# Patient Record
Sex: Female | Born: 1935 | Race: White | Hispanic: No | Smoking: Never smoker
Health system: Southern US, Community
[De-identification: ages and names within clinical notes are randomized; demographics above are authoritative.]

## PROBLEM LIST (undated history)

## (undated) DIAGNOSIS — R0600 Dyspnea, unspecified: Secondary | ICD-10-CM

## (undated) DIAGNOSIS — Z9889 Other specified postprocedural states: Secondary | ICD-10-CM

## (undated) DIAGNOSIS — E039 Hypothyroidism, unspecified: Secondary | ICD-10-CM

## (undated) DIAGNOSIS — N280 Ischemia and infarction of kidney: Secondary | ICD-10-CM

## (undated) DIAGNOSIS — H8110 Benign paroxysmal vertigo, unspecified ear: Secondary | ICD-10-CM

## (undated) DIAGNOSIS — I10 Essential (primary) hypertension: Secondary | ICD-10-CM

## (undated) DIAGNOSIS — R112 Nausea with vomiting, unspecified: Secondary | ICD-10-CM

## (undated) DIAGNOSIS — I2510 Atherosclerotic heart disease of native coronary artery without angina pectoris: Secondary | ICD-10-CM

## (undated) DIAGNOSIS — G459 Transient cerebral ischemic attack, unspecified: Secondary | ICD-10-CM

## (undated) DIAGNOSIS — Z973 Presence of spectacles and contact lenses: Secondary | ICD-10-CM

## (undated) DIAGNOSIS — I209 Angina pectoris, unspecified: Secondary | ICD-10-CM

## (undated) DIAGNOSIS — I480 Paroxysmal atrial fibrillation: Secondary | ICD-10-CM

## (undated) DIAGNOSIS — K56609 Unspecified intestinal obstruction, unspecified as to partial versus complete obstruction: Secondary | ICD-10-CM

## (undated) DIAGNOSIS — M1990 Unspecified osteoarthritis, unspecified site: Secondary | ICD-10-CM

## (undated) DIAGNOSIS — T4145XA Adverse effect of unspecified anesthetic, initial encounter: Secondary | ICD-10-CM

## (undated) DIAGNOSIS — T8859XA Other complications of anesthesia, initial encounter: Secondary | ICD-10-CM

## (undated) DIAGNOSIS — I639 Cerebral infarction, unspecified: Secondary | ICD-10-CM

## (undated) HISTORY — DX: Transient cerebral ischemic attack, unspecified: G45.9

## (undated) HISTORY — DX: Paroxysmal atrial fibrillation: I48.0

## (undated) HISTORY — PX: CATARACT EXTRACTION: SUR2

## (undated) HISTORY — PX: TONSILLECTOMY: SUR1361

## (undated) HISTORY — PX: ABDOMINAL HYSTERECTOMY: SHX81

---

## 2013-08-25 DIAGNOSIS — Z1151 Encounter for screening for human papillomavirus (HPV): Secondary | ICD-10-CM | POA: Insufficient documentation

## 2013-08-25 DIAGNOSIS — Z124 Encounter for screening for malignant neoplasm of cervix: Secondary | ICD-10-CM | POA: Insufficient documentation

## 2013-08-28 ENCOUNTER — Other Ambulatory Visit (HOSPITAL_COMMUNITY)
Admission: RE | Admit: 2013-08-28 | Discharge: 2013-08-28 | Disposition: A | Discharge status: Home / Self Care | Payer: Medicare FFS | Source: Ambulatory Visit | Attending: Obstetrics and Gynecology | Admitting: Obstetrics and Gynecology

## 2013-09-01 ENCOUNTER — Other Ambulatory Visit: Payer: Self-pay | Admitting: Gastroenterology

## 2013-11-17 ENCOUNTER — Encounter (HOSPITAL_COMMUNITY): Payer: Self-pay | Admitting: Pharmacy Technician

## 2013-11-18 ENCOUNTER — Encounter (HOSPITAL_COMMUNITY): Payer: Self-pay | Admitting: *Deleted

## 2013-11-19 ENCOUNTER — Ambulatory Visit: Payer: Medicare FFS | Attending: Geriatric Medicine | Admitting: Physical Therapy

## 2013-11-19 DIAGNOSIS — H8110 Benign paroxysmal vertigo, unspecified ear: Secondary | ICD-10-CM | POA: Diagnosis not present

## 2013-11-19 DIAGNOSIS — IMO0001 Reserved for inherently not codable concepts without codable children: Secondary | ICD-10-CM | POA: Diagnosis not present

## 2013-11-25 ENCOUNTER — Other Ambulatory Visit: Payer: Self-pay | Admitting: Gastroenterology

## 2013-11-26 ENCOUNTER — Ambulatory Visit: Payer: Medicare FFS | Admitting: Physical Therapy

## 2013-11-26 DIAGNOSIS — IMO0001 Reserved for inherently not codable concepts without codable children: Secondary | ICD-10-CM | POA: Diagnosis not present

## 2013-12-01 ENCOUNTER — Ambulatory Visit (HOSPITAL_COMMUNITY)
Admission: RE | Admit: 2013-12-01 | Discharge: 2013-12-01 | Disposition: A | Discharge status: Home / Self Care | Payer: Medicare FFS | Source: Ambulatory Visit | Attending: Gastroenterology | Admitting: Gastroenterology

## 2013-12-01 ENCOUNTER — Encounter (HOSPITAL_COMMUNITY): Payer: Medicare FFS | Admitting: Anesthesiology

## 2013-12-01 ENCOUNTER — Encounter (HOSPITAL_COMMUNITY): Payer: Self-pay

## 2013-12-01 ENCOUNTER — Ambulatory Visit (HOSPITAL_COMMUNITY): Payer: Medicare FFS | Admitting: Anesthesiology

## 2013-12-01 ENCOUNTER — Encounter (HOSPITAL_COMMUNITY)
Admission: RE | Disposition: A | Discharge status: Home / Self Care | Payer: Self-pay | Source: Ambulatory Visit | Attending: Gastroenterology

## 2013-12-01 DIAGNOSIS — K4090 Unilateral inguinal hernia, without obstruction or gangrene, not specified as recurrent: Secondary | ICD-10-CM | POA: Diagnosis not present

## 2013-12-01 DIAGNOSIS — M949 Disorder of cartilage, unspecified: Secondary | ICD-10-CM

## 2013-12-01 DIAGNOSIS — E039 Hypothyroidism, unspecified: Secondary | ICD-10-CM | POA: Insufficient documentation

## 2013-12-01 DIAGNOSIS — Z1211 Encounter for screening for malignant neoplasm of colon: Secondary | ICD-10-CM | POA: Insufficient documentation

## 2013-12-01 DIAGNOSIS — M899 Disorder of bone, unspecified: Secondary | ICD-10-CM | POA: Diagnosis not present

## 2013-12-01 DIAGNOSIS — Z888 Allergy status to other drugs, medicaments and biological substances status: Secondary | ICD-10-CM | POA: Insufficient documentation

## 2013-12-01 DIAGNOSIS — E7800 Pure hypercholesterolemia, unspecified: Secondary | ICD-10-CM | POA: Insufficient documentation

## 2013-12-01 HISTORY — DX: Benign paroxysmal vertigo, unspecified ear: H81.10

## 2013-12-01 HISTORY — DX: Hypothyroidism, unspecified: E03.9

## 2013-12-01 HISTORY — DX: Nausea with vomiting, unspecified: R11.2

## 2013-12-01 HISTORY — DX: Other complications of anesthesia, initial encounter: T88.59XA

## 2013-12-01 HISTORY — DX: Nausea with vomiting, unspecified: Z98.890

## 2013-12-01 HISTORY — PX: COLONOSCOPY WITH PROPOFOL: SHX5780

## 2013-12-01 HISTORY — DX: Adverse effect of unspecified anesthetic, initial encounter: T41.45XA

## 2013-12-01 SURGERY — COLONOSCOPY WITH PROPOFOL
Anesthesia: Monitor Anesthesia Care

## 2013-12-01 MED ORDER — LACTATED RINGERS IV SOLN
INTRAVENOUS | Status: DC | PRN
  Administered 2013-12-01: 13:00:00 via INTRAVENOUS

## 2013-12-01 MED ORDER — LACTATED RINGERS IV SOLN
INTRAVENOUS | Status: DC
  Administered 2013-12-01: 1000 mL via INTRAVENOUS

## 2013-12-01 MED ORDER — SODIUM CHLORIDE 0.9 % IV SOLN: INTRAVENOUS | Status: DC

## 2013-12-01 MED ORDER — PROPOFOL 10 MG/ML IV BOLUS
INTRAVENOUS | Status: AC
  Filled 2013-12-01: qty 20

## 2013-12-01 MED ORDER — PROPOFOL 10 MG/ML IV BOLUS
INTRAVENOUS | Status: DC | PRN
  Administered 2013-12-01 (×3): 25 mg via INTRAVENOUS
  Administered 2013-12-01 (×2): 50 mg via INTRAVENOUS
  Administered 2013-12-01: 25 mg via INTRAVENOUS

## 2013-12-01 SURGICAL SUPPLY — 22 items

## 2013-12-01 NOTE — Anesthesia Preprocedure Evaluation (Addendum)
Anesthesia Evaluation  Patient identified by MRN, date of birth, ID band Patient awake    Reviewed: Allergy & Precautions, H&P , NPO status , Patient's Chart, lab work & pertinent test results  History of Anesthesia Complications (+) PONV  Airway Mallampati: II TM Distance: >3 FB Neck ROM: Full    Dental no notable dental hx.    Pulmonary neg pulmonary ROS,  breath sounds clear to auscultation  Pulmonary exam normal       Cardiovascular negative cardio ROS  Rhythm:Regular Rate:Normal     Neuro/Psych negative neurological ROS  negative psych ROS   GI/Hepatic negative GI ROS, Neg liver ROS,   Endo/Other  negative endocrine ROS  Renal/GU negative Renal ROS  negative genitourinary   Musculoskeletal negative musculoskeletal ROS (+)   Abdominal   Peds negative pediatric ROS (+)  Hematology negative hematology ROS (+)   Anesthesia Other Findings   Reproductive/Obstetrics negative OB ROS                          Anesthesia Physical Anesthesia Plan  ASA: II  Anesthesia Plan: MAC   Post-op Pain Management:    Induction:   Airway Management Planned: Simple Face Mask  Additional Equipment:   Intra-op Plan:   Post-operative Plan:   Informed Consent: I have reviewed the patients History and Physical, chart, labs and discussed the procedure including the risks, benefits and alternatives for the proposed anesthesia with the patient or authorized representative who has indicated his/her understanding and acceptance.   Dental advisory given  Plan Discussed with: CRNA  Anesthesia Plan Comments:         Anesthesia Quick Evaluation

## 2013-12-01 NOTE — Op Note (Signed)
Procedure: Screening colonoscopy  Endoscopist: Earle Gell  Premedication: Propofol administered by anesthesia  Procedure: The patient was placed in the left lateral decubitus position. Anal inspection and digital rectal exam were normal. The Pentax pediatric colonoscope was introduced into the rectum and advanced to the cecum. A normal-appearing ileocecal valve and appendiceal orifice were identified. Colonic preparation for the exam today was good. Performance of today's colonoscopy was technically very difficult due to colonic loop formation  Rectum. Normal. Retroflexed view of the distal rectum normal  Sigmoid colon and descending colon. Normal  Splenic flexure. Normal  Transverse colon. Normal  Hepatic flexure. Normal.  Ascending colon. Normal  Cecum and ileocecal valve. Normal  Assessment: Normal screening colonoscopy

## 2013-12-01 NOTE — Transfer of Care (Signed)
Immediate Anesthesia Transfer of Care Note  Patient: Rachel Park  Procedure(s) Performed: Procedure(s): COLONOSCOPY WITH PROPOFOL (N/A)  Patient Location: PACU  Anesthesia Type:MAC  Level of Consciousness: awake, sedated and patient cooperative  Airway & Oxygen Therapy: Patient Spontanous Breathing and Patient connected to face mask oxygen  Post-op Assessment: Report given to PACU RN and Post -op Vital signs reviewed and stable  Post vital signs: Reviewed and stable  Complications: No apparent anesthesia complications

## 2013-12-01 NOTE — Anesthesia Postprocedure Evaluation (Signed)
  Anesthesia Post-op Note  Patient: Rachel Park  Procedure(s) Performed: Procedure(s) (LRB): COLONOSCOPY WITH PROPOFOL (N/A)  Patient Location: PACU  Anesthesia Type: MAC  Level of Consciousness: awake and alert   Airway and Oxygen Therapy: Patient Spontanous Breathing  Post-op Pain: mild  Post-op Assessment: Post-op Vital signs reviewed, Patient's Cardiovascular Status Stable, Respiratory Function Stable, Patent Airway and No signs of Nausea or vomiting  Last Vitals:  Filed Vitals:   12/01/13 1400  BP: 163/69  Pulse: 58  Temp:   Resp: 16    Post-op Vital Signs: stable   Complications: No apparent anesthesia complications

## 2013-12-01 NOTE — Discharge Instructions (Signed)
Colonoscopy, Care After Refer to this sheet in the next few weeks. These instructions provide you with information on caring for yourself after your procedure. Your health care provider may also give you more specific instructions. Your treatment has been planned according to current medical practices, but problems sometimes occur. Call your health care provider if you have any problems or questions after your procedure. WHAT TO EXPECT AFTER THE PROCEDURE  After your procedure, it is typical to have the following:  A small amount of blood in your stool.  Moderate amounts of gas and mild abdominal cramping or bloating. HOME CARE INSTRUCTIONS  Do not drive, operate machinery, or sign important documents for 24 hours.  You may shower and resume your regular physical activities, but move at a slower pace for the first 24 hours.  Take frequent rest periods for the first 24 hours.  Walk around or put a warm pack on your abdomen to help reduce abdominal cramping and bloating.  Drink enough fluids to keep your urine clear or pale yellow.  You may resume your normal diet as instructed by your health care provider. Avoid heavy or fried foods that are hard to digest.  Avoid drinking alcohol for 24 hours or as instructed by your health care provider.  Only take over-the-counter or prescription medicines as directed by your health care provider.  If a tissue sample (biopsy) was taken during your procedure:  Do not take aspirin or blood thinners for 7 days, or as instructed by your health care provider.  Do not drink alcohol for 7 days, or as instructed by your health care provider.  Eat soft foods for the first 24 hours. SEEK MEDICAL CARE IF: You have persistent spotting of blood in your stool 2-3 days after the procedure. SEEK IMMEDIATE MEDICAL CARE IF:  You have more than a small spotting of blood in your stool.  You pass large blood clots in your stool.  Your abdomen is swollen  (distended).  You have nausea or vomiting.  You have a fever.  You have increasing abdominal pain that is not relieved with medicine. Document Released: 10/05/2003 Document Revised: 12/11/2012 Document Reviewed: 10/28/2012 [facility] Patient Information 2015 [place], [place]. This information is not intended to replace advice given to you by your health care provider. Make sure you discuss any questions you have with your health care provider.  Jenavie Stanczak  12/01/2013    Brief History and Findings:{NA AND GGEZMOQH:47654}.   Local Anesthetic:{NA AND YTKPTWSF:68127}.   Procedure: COLONOSCOPY WITH PROPOFOL.   Specimen:{Specimen:14613}.   Condition of Patient Post Procedure:{Post Procedure Pat NTZG:01749::"SWHQPRFFM procedure well"}.   Instructions    Activity:{plan; gen activity restrictions:13447}   Diet:{diet:18644}   Wound Care:{wound care:18263}   Follow-up:{follow up:32580} with Garlan Fair, MD.   Other{NA AND BWGYKZLD:35701}.

## 2013-12-01 NOTE — H&P (Signed)
  Procedure: Screening colonoscopy. Screening colonoscopy performed in [place] in January 2005.  History: The patient is a 80 year old female scheduled to undergo a screening colonoscopy with polypectomy to prevent colon cancer.  Medication allergies: Zocor causes muscle pain  Past medical history: Osteopenia. Hypothyroidism. Hypercholesterolemia. Left inguinal hernia. Bilateral cataract surgery. Vaginal hysterectomy. Tonsillectomy.  Exam: The patient is alert and lying comfortably on the endoscopy stretcher. Cardiac exam reveals a regular rhythm. Lungs are clear to auscultation. Abdomen is soft and nontender to palpation  Plan: Proceed with screening colonoscopy

## 2013-12-02 ENCOUNTER — Encounter (HOSPITAL_COMMUNITY): Payer: Self-pay | Admitting: Gastroenterology

## 2013-12-03 ENCOUNTER — Ambulatory Visit: Payer: Medicare FFS | Admitting: Physical Therapy

## 2013-12-05 ENCOUNTER — Ambulatory Visit: Payer: Medicare FFS | Attending: Geriatric Medicine | Admitting: Physical Therapy

## 2013-12-05 DIAGNOSIS — H8110 Benign paroxysmal vertigo, unspecified ear: Secondary | ICD-10-CM | POA: Insufficient documentation

## 2013-12-10 ENCOUNTER — Ambulatory Visit: Payer: Medicare FFS | Admitting: Physical Therapy

## 2013-12-17 ENCOUNTER — Encounter: Payer: Medicare FFS | Admitting: Rehabilitative and Restorative Service Providers"

## 2014-12-24 ENCOUNTER — Emergency Department (HOSPITAL_COMMUNITY): Payer: Medicare Other

## 2014-12-24 ENCOUNTER — Inpatient Hospital Stay (HOSPITAL_COMMUNITY): Payer: Medicare Other

## 2014-12-24 ENCOUNTER — Inpatient Hospital Stay (HOSPITAL_COMMUNITY)
Admission: EM | Admit: 2014-12-24 | Discharge: 2014-12-26 | DRG: 246 | Disposition: A | Discharge status: Home / Self Care | Payer: Medicare Other | Attending: Cardiovascular Disease | Admitting: Cardiovascular Disease

## 2014-12-24 ENCOUNTER — Encounter (HOSPITAL_COMMUNITY)
Admission: EM | Disposition: A | Discharge status: Home / Self Care | Payer: Medicare FFS | Source: Home / Self Care | Attending: Cardiovascular Disease

## 2014-12-24 ENCOUNTER — Encounter (HOSPITAL_COMMUNITY): Payer: Self-pay

## 2014-12-24 DIAGNOSIS — R1110 Vomiting, unspecified: Secondary | ICD-10-CM | POA: Diagnosis present

## 2014-12-24 DIAGNOSIS — I97820 Postprocedural cerebrovascular infarction during cardiac surgery: Secondary | ICD-10-CM | POA: Diagnosis not present

## 2014-12-24 DIAGNOSIS — R4701 Aphasia: Secondary | ICD-10-CM | POA: Diagnosis not present

## 2014-12-24 DIAGNOSIS — T502X5A Adverse effect of carbonic-anhydrase inhibitors, benzothiadiazides and other diuretics, initial encounter: Secondary | ICD-10-CM | POA: Diagnosis not present

## 2014-12-24 DIAGNOSIS — J449 Chronic obstructive pulmonary disease, unspecified: Secondary | ICD-10-CM | POA: Diagnosis present

## 2014-12-24 DIAGNOSIS — I25110 Atherosclerotic heart disease of native coronary artery with unstable angina pectoris: Principal | ICD-10-CM | POA: Diagnosis present

## 2014-12-24 DIAGNOSIS — Z79899 Other long term (current) drug therapy: Secondary | ICD-10-CM

## 2014-12-24 DIAGNOSIS — E039 Hypothyroidism, unspecified: Secondary | ICD-10-CM | POA: Diagnosis not present

## 2014-12-24 DIAGNOSIS — Z90710 Acquired absence of both cervix and uterus: Secondary | ICD-10-CM

## 2014-12-24 DIAGNOSIS — I639 Cerebral infarction, unspecified: Secondary | ICD-10-CM

## 2014-12-24 DIAGNOSIS — I63139 Cerebral infarction due to embolism of unspecified carotid artery: Secondary | ICD-10-CM | POA: Diagnosis not present

## 2014-12-24 DIAGNOSIS — Z9842 Cataract extraction status, left eye: Secondary | ICD-10-CM | POA: Diagnosis not present

## 2014-12-24 DIAGNOSIS — I200 Unstable angina: Secondary | ICD-10-CM | POA: Diagnosis not present

## 2014-12-24 DIAGNOSIS — Z9841 Cataract extraction status, right eye: Secondary | ICD-10-CM | POA: Diagnosis not present

## 2014-12-24 DIAGNOSIS — Z955 Presence of coronary angioplasty implant and graft: Secondary | ICD-10-CM

## 2014-12-24 DIAGNOSIS — E785 Hyperlipidemia, unspecified: Secondary | ICD-10-CM | POA: Diagnosis present

## 2014-12-24 DIAGNOSIS — I209 Angina pectoris, unspecified: Secondary | ICD-10-CM

## 2014-12-24 DIAGNOSIS — I10 Essential (primary) hypertension: Secondary | ICD-10-CM | POA: Diagnosis present

## 2014-12-24 DIAGNOSIS — I6789 Other cerebrovascular disease: Secondary | ICD-10-CM | POA: Diagnosis not present

## 2014-12-24 DIAGNOSIS — I2510 Atherosclerotic heart disease of native coronary artery without angina pectoris: Secondary | ICD-10-CM

## 2014-12-24 DIAGNOSIS — I63132 Cerebral infarction due to embolism of left carotid artery: Secondary | ICD-10-CM | POA: Diagnosis not present

## 2014-12-24 DIAGNOSIS — I6340 Cerebral infarction due to embolism of unspecified cerebral artery: Secondary | ICD-10-CM | POA: Diagnosis not present

## 2014-12-24 HISTORY — PX: CARDIAC CATHETERIZATION: SHX172

## 2014-12-24 LAB — COMPREHENSIVE METABOLIC PANEL
ALBUMIN: 3.7 g/dL (ref 3.5–5.0)
ALT: 24 U/L (ref 14–54)
ANION GAP: 7 (ref 5–15)
AST: 39 U/L (ref 15–41)
Alkaline Phosphatase: 57 U/L (ref 38–126)
BILIRUBIN TOTAL: 0.5 mg/dL (ref 0.3–1.2)
BUN: 20 mg/dL (ref 6–20)
CALCIUM: 9.2 mg/dL (ref 8.9–10.3)
CHLORIDE: 106 mmol/L (ref 101–111)
CO2: 25 mmol/L (ref 22–32)
Creatinine, Ser: 0.56 mg/dL (ref 0.44–1.00)
GFR calc Af Amer: >60 mL/min (ref >60)
GFR calc non Af Amer: >60 mL/min (ref >60)
Glucose, Bld: 103 mg/dL — ABNORMAL HIGH (ref 65–99)
POTASSIUM: 4.1 mmol/L (ref 3.5–5.1)
SODIUM: 138 mmol/L (ref 135–145)
Total Protein: 6.1 g/dL — ABNORMAL LOW (ref 6.5–8.1)

## 2014-12-24 LAB — CBC WITH DIFFERENTIAL/PLATELET
BASOS PCT: 1 %
Basophils Absolute: 0 10*3/uL (ref 0.0–0.1)
EOS PCT: 1 %
Eosinophils Absolute: 0.1 10*3/uL (ref 0.0–0.7)
HCT: 37.8 % (ref 36.0–46.0)
Hemoglobin: 12.6 g/dL (ref 12.0–15.0)
LYMPHS ABS: 1.7 10*3/uL (ref 0.7–4.0)
Lymphocytes Relative: 28 %
MCH: 31.7 pg (ref 26.0–34.0)
MCHC: 33.3 g/dL (ref 30.0–36.0)
MCV: 95 fL (ref 78.0–100.0)
Monocytes Absolute: 0.4 10*3/uL (ref 0.1–1.0)
Monocytes Relative: 7 %
Neutro Abs: 3.8 10*3/uL (ref 1.7–7.7)
Neutrophils Relative %: 63 %
PLATELETS: 234 10*3/uL (ref 150–400)
RBC: 3.98 MIL/uL (ref 3.87–5.11)
RDW: 13.2 % (ref 11.5–15.5)
WBC: 5.9 10*3/uL (ref 4.0–10.5)

## 2014-12-24 LAB — URINALYSIS, ROUTINE W REFLEX MICROSCOPIC
BILIRUBIN URINE: NEGATIVE
Glucose, UA: NEGATIVE mg/dL
HGB URINE DIPSTICK: NEGATIVE
KETONES UR: NEGATIVE mg/dL
Nitrite: NEGATIVE
PH: 7 (ref 5.0–8.0)
Protein, ur: NEGATIVE mg/dL
Specific Gravity, Urine: 1.006 (ref 1.005–1.030)
Urobilinogen, UA: 0.2 mg/dL (ref 0.0–1.0)

## 2014-12-24 LAB — TSH: TSH: 2.481 u[IU]/mL (ref 0.350–4.500)

## 2014-12-24 LAB — PROTIME-INR
INR: 0.97 (ref 0.00–1.49)
PROTHROMBIN TIME: 13.1 s (ref 11.6–15.2)

## 2014-12-24 LAB — LIPASE, BLOOD: LIPASE: 26 U/L (ref 11–51)

## 2014-12-24 LAB — URINE MICROSCOPIC-ADD ON

## 2014-12-24 LAB — TROPONIN I

## 2014-12-24 LAB — BRAIN NATRIURETIC PEPTIDE: B Natriuretic Peptide: 100.3 pg/mL — ABNORMAL HIGH (ref 0.0–100.0)

## 2014-12-24 LAB — MRSA PCR SCREENING: MRSA by PCR: NEGATIVE

## 2014-12-24 SURGERY — LEFT HEART CATH AND CORONARY ANGIOGRAPHY
Anesthesia: LOCAL

## 2014-12-24 MED ORDER — MIDAZOLAM HCL 2 MG/2ML IJ SOLN
INTRAMUSCULAR | Status: DC | PRN
  Administered 2014-12-24: 1 mg via INTRAVENOUS
  Administered 2014-12-24: 2 mg via INTRAVENOUS

## 2014-12-24 MED ORDER — BIOTIN 5 MG PO CAPS: 5.0000 mg | ORAL_CAPSULE | Freq: Every day | ORAL | Status: DC

## 2014-12-24 MED ORDER — HEPARIN (PORCINE) IN NACL 2-0.9 UNIT/ML-% IJ SOLN
INTRAMUSCULAR | Status: AC
  Filled 2014-12-24: qty 1000

## 2014-12-24 MED ORDER — VERAPAMIL HCL 2.5 MG/ML IV SOLN
INTRAVENOUS | Status: AC
  Filled 2014-12-24: qty 2

## 2014-12-24 MED ORDER — SODIUM CHLORIDE 0.9 % IJ SOLN: 3.0000 mL | INTRAMUSCULAR | Status: DC | PRN

## 2014-12-24 MED ORDER — LEVOTHYROXINE SODIUM 75 MCG PO TABS
75.0000 ug | ORAL_TABLET | Freq: Every day | ORAL | Status: DC
  Administered 2014-12-25 – 2014-12-26 (×2): 75 ug via ORAL
  Filled 2014-12-24 (×2): qty 1

## 2014-12-24 MED ORDER — ASPIRIN 81 MG PO CHEW: 81.0000 mg | CHEWABLE_TABLET | ORAL | Status: DC

## 2014-12-24 MED ORDER — CALCIUM CARB-CHOLECALCIFEROL 600-200 MG-UNIT PO TABS: ORAL_TABLET | Freq: Every day | ORAL | Status: DC

## 2014-12-24 MED ORDER — ASPIRIN EC 81 MG PO TBEC
81.0000 mg | DELAYED_RELEASE_TABLET | Freq: Every day | ORAL | Status: DC
  Administered 2014-12-25 – 2014-12-26 (×2): 81 mg via ORAL
  Filled 2014-12-24 (×2): qty 1

## 2014-12-24 MED ORDER — CLOPIDOGREL BISULFATE 300 MG PO TABS
ORAL_TABLET | ORAL | Status: DC | PRN
  Administered 2014-12-24: 600 mg via ORAL

## 2014-12-24 MED ORDER — LIDOCAINE HCL (PF) 1 % IJ SOLN
INTRAMUSCULAR | Status: AC
  Filled 2014-12-24: qty 30

## 2014-12-24 MED ORDER — OXYCODONE-ACETAMINOPHEN 5-325 MG PO TABS
1.0000 | ORAL_TABLET | ORAL | Status: DC | PRN
  Administered 2014-12-24: 1 via ORAL
  Filled 2014-12-24: qty 1

## 2014-12-24 MED ORDER — ONDANSETRON HCL 4 MG/2ML IJ SOLN
4.0000 mg | Freq: Once | INTRAMUSCULAR | Status: AC
  Administered 2014-12-24: 4 mg via INTRAVENOUS

## 2014-12-24 MED ORDER — NITROGLYCERIN 1 MG/10 ML FOR IR/CATH LAB
INTRA_ARTERIAL | Status: AC
Start: 2014-12-24 — End: 2014-12-24
  Filled 2014-12-24: qty 10

## 2014-12-24 MED ORDER — MIDAZOLAM HCL 2 MG/2ML IJ SOLN
INTRAMUSCULAR | Status: AC
  Filled 2014-12-24: qty 4

## 2014-12-24 MED ORDER — HEPARIN SODIUM (PORCINE) 1000 UNIT/ML IJ SOLN
INTRAMUSCULAR | Status: AC
  Filled 2014-12-24: qty 1

## 2014-12-24 MED ORDER — BIVALIRUDIN BOLUS VIA INFUSION - CUPID
INTRAVENOUS | Status: DC | PRN
  Administered 2014-12-24: 42.45 mg via INTRAVENOUS

## 2014-12-24 MED ORDER — ACETAMINOPHEN 325 MG PO TABS
650.0000 mg | ORAL_TABLET | Freq: Four times a day (QID) | ORAL | Status: DC | PRN
  Administered 2014-12-24 – 2014-12-25 (×3): 650 mg via ORAL
  Filled 2014-12-24 (×3): qty 2

## 2014-12-24 MED ORDER — ONDANSETRON HCL 4 MG/2ML IJ SOLN
4.0000 mg | Freq: Four times a day (QID) | INTRAMUSCULAR | Status: DC | PRN
  Administered 2014-12-24: 4 mg via INTRAVENOUS
  Filled 2014-12-24: qty 2

## 2014-12-24 MED ORDER — CLOPIDOGREL BISULFATE 75 MG PO TABS
75.0000 mg | ORAL_TABLET | Freq: Every day | ORAL | Status: DC
Start: 2014-12-25 — End: 2014-12-26
  Administered 2014-12-25 – 2014-12-26 (×2): 75 mg via ORAL
  Filled 2014-12-24 (×2): qty 1

## 2014-12-24 MED ORDER — SODIUM CHLORIDE 0.9 % IJ SOLN
3.0000 mL | Freq: Two times a day (BID) | INTRAMUSCULAR | Status: DC
  Administered 2014-12-24 – 2014-12-25 (×3): 3 mL via INTRAVENOUS

## 2014-12-24 MED ORDER — ATORVASTATIN CALCIUM 40 MG PO TABS
40.0000 mg | ORAL_TABLET | Freq: Every day | ORAL | Status: DC
  Administered 2014-12-25: 40 mg via ORAL
  Filled 2014-12-24: qty 1

## 2014-12-24 MED ORDER — SODIUM CHLORIDE 0.9 % WEIGHT BASED INFUSION: 1.0000 mL/kg/h | INTRAVENOUS | Status: DC

## 2014-12-24 MED ORDER — SODIUM CHLORIDE 0.9 % WEIGHT BASED INFUSION: 3.0000 mL/kg/h | INTRAVENOUS | Status: DC

## 2014-12-24 MED ORDER — SODIUM CHLORIDE 0.9 % IV SOLN
250.0000 mg | INTRAVENOUS | Status: DC | PRN
  Administered 2014-12-24: 1.75 mg/kg/h via INTRAVENOUS

## 2014-12-24 MED ORDER — IOHEXOL 350 MG/ML SOLN
INTRAVENOUS | Status: DC | PRN
  Administered 2014-12-24: 130 mL via INTRACARDIAC

## 2014-12-24 MED ORDER — ADULT MULTIVITAMIN W/MINERALS CH: 1.0000 | ORAL_TABLET | Freq: Every day | ORAL | Status: DC

## 2014-12-24 MED ORDER — HYDRALAZINE HCL 20 MG/ML IJ SOLN
10.0000 mg | Freq: Four times a day (QID) | INTRAMUSCULAR | Status: DC | PRN
  Administered 2014-12-24 (×2): 10 mg via INTRAVENOUS
  Filled 2014-12-24 (×2): qty 1

## 2014-12-24 MED ORDER — HEPARIN SODIUM (PORCINE) 1000 UNIT/ML IJ SOLN
INTRAMUSCULAR | Status: DC | PRN
Start: 2014-12-24 — End: 2014-12-24
  Administered 2014-12-24: 3000 [IU] via INTRAVENOUS

## 2014-12-24 MED ORDER — CARVEDILOL 3.125 MG PO TABS
3.1250 mg | ORAL_TABLET | Freq: Two times a day (BID) | ORAL | Status: DC
  Administered 2014-12-25 (×2): 3.125 mg via ORAL
  Filled 2014-12-24 (×2): qty 1

## 2014-12-24 MED ORDER — HEPARIN (PORCINE) IN NACL 2-0.9 UNIT/ML-% IJ SOLN
INTRAMUSCULAR | Status: DC | PRN
  Administered 2014-12-24: 18:00:00 via INTRA_ARTERIAL

## 2014-12-24 MED ORDER — METOPROLOL TARTRATE 1 MG/ML IV SOLN
2.5000 mg | Freq: Once | INTRAVENOUS | Status: DC
  Filled 2014-12-24: qty 5

## 2014-12-24 MED ORDER — NITROGLYCERIN 0.4 MG SL SUBL: 0.4000 mg | SUBLINGUAL_TABLET | SUBLINGUAL | Status: DC | PRN

## 2014-12-24 MED ORDER — METOCLOPRAMIDE HCL 5 MG/ML IJ SOLN
10.0000 mg | Freq: Four times a day (QID) | INTRAMUSCULAR | Status: DC | PRN
  Administered 2014-12-25: 10 mg via INTRAVENOUS
  Filled 2014-12-24: qty 2

## 2014-12-24 MED ORDER — SODIUM CHLORIDE 0.9 % IV SOLN
250.0000 mL | INTRAVENOUS | Status: DC | PRN
  Administered 2014-12-24: 250 mL via INTRAVENOUS

## 2014-12-24 MED ORDER — BIVALIRUDIN 250 MG IV SOLR
INTRAVENOUS | Status: AC
  Filled 2014-12-24: qty 250

## 2014-12-24 MED ORDER — NITROGLYCERIN 1 MG/10 ML FOR IR/CATH LAB
INTRA_ARTERIAL | Status: DC | PRN
  Administered 2014-12-24: 150 ug via INTRACORONARY
  Administered 2014-12-24: 100 ug via INTRACORONARY

## 2014-12-24 MED ORDER — ONDANSETRON HCL 4 MG/2ML IJ SOLN: 4.0000 mg | Freq: Four times a day (QID) | INTRAMUSCULAR | Status: DC | PRN

## 2014-12-24 MED ORDER — ACETAMINOPHEN 325 MG PO TABS: 650.0000 mg | ORAL_TABLET | ORAL | Status: DC | PRN

## 2014-12-24 MED ORDER — SODIUM CHLORIDE 0.9 % IJ SOLN: 3.0000 mL | Freq: Two times a day (BID) | INTRAMUSCULAR | Status: DC

## 2014-12-24 MED ORDER — FENTANYL CITRATE (PF) 100 MCG/2ML IJ SOLN
INTRAMUSCULAR | Status: DC | PRN
  Administered 2014-12-24: 25 ug via INTRAVENOUS
  Administered 2014-12-24: 50 ug via INTRAVENOUS

## 2014-12-24 MED ORDER — FENTANYL CITRATE (PF) 100 MCG/2ML IJ SOLN
INTRAMUSCULAR | Status: AC
Start: 2014-12-24 — End: 2014-12-24
  Filled 2014-12-24: qty 4

## 2014-12-24 MED ORDER — CLOPIDOGREL BISULFATE 300 MG PO TABS
ORAL_TABLET | ORAL | Status: AC
  Filled 2014-12-24: qty 2

## 2014-12-24 MED ORDER — VITAMIN D 1000 UNITS PO TABS: 1000.0000 [IU] | ORAL_TABLET | Freq: Every day | ORAL | Status: DC

## 2014-12-24 MED ORDER — BLISTEX MEDICATED EX OINT
TOPICAL_OINTMENT | CUTANEOUS | Status: DC | PRN
  Administered 2014-12-25: 01:00:00 via TOPICAL
  Filled 2014-12-24: qty 10

## 2014-12-24 MED ORDER — ASPIRIN 81 MG PO CHEW
324.0000 mg | CHEWABLE_TABLET | ORAL | Status: AC
  Administered 2014-12-24: 324 mg via ORAL
  Filled 2014-12-24: qty 4

## 2014-12-24 MED ORDER — ONDANSETRON HCL 4 MG/2ML IJ SOLN
4.0000 mg | Freq: Four times a day (QID) | INTRAMUSCULAR | Status: DC | PRN
Start: 2014-12-24 — End: 2014-12-24

## 2014-12-24 MED ORDER — SODIUM CHLORIDE 0.9 % IV SOLN: 250.0000 mL | INTRAVENOUS | Status: DC | PRN

## 2014-12-24 MED ORDER — NITROGLYCERIN 2 % TD OINT
1.0000 [in_us] | TOPICAL_OINTMENT | Freq: Once | TRANSDERMAL | Status: AC
  Administered 2014-12-24: 1 [in_us] via TOPICAL
  Filled 2014-12-24: qty 1

## 2014-12-24 MED ORDER — SODIUM CHLORIDE 0.9 % IV SOLN: INTRAVENOUS | Status: AC

## 2014-12-24 MED ORDER — CALCIUM CARBONATE-VITAMIN D 500-200 MG-UNIT PO TABS
1.0000 | ORAL_TABLET | Freq: Every day | ORAL | Status: DC
  Administered 2014-12-25 – 2014-12-26 (×2): 1 via ORAL
  Filled 2014-12-24 (×2): qty 1

## 2014-12-24 MED ORDER — ASPIRIN 300 MG RE SUPP: 300.0000 mg | RECTAL | Status: AC

## 2014-12-24 MED ORDER — ONDANSETRON HCL 4 MG/2ML IJ SOLN
INTRAMUSCULAR | Status: AC
  Administered 2014-12-24: 4 mg via INTRAVENOUS
  Filled 2014-12-24: qty 2

## 2014-12-24 SURGICAL SUPPLY — 22 items
BALLN EMERGE MR 2.5X15 (BALLOONS) ×2
BALLN ~~LOC~~ EMERGE MR 3.25X15 (BALLOONS) ×2
BALLOON EMERGE MR 2.5X15 (BALLOONS) IMPLANT
BALLOON ~~LOC~~ EMERGE MR 3.25X15 (BALLOONS) IMPLANT
CATH HEARTRAIL 6F IL4.0 (CATHETERS) ×1 IMPLANT
CATH INFINITI 5 FR JL3.5 (CATHETERS) ×2 IMPLANT
CATH INFINITI 5FR ANG PIGTAIL (CATHETERS) ×2 IMPLANT
CATH INFINITI JR4 5F (CATHETERS) ×2 IMPLANT
CATH VISTA GUIDE 6FR XBLAD3.5 (CATHETERS) ×1 IMPLANT
DEVICE RAD COMP TR BAND LRG (VASCULAR PRODUCTS) ×2 IMPLANT
GLIDESHEATH SLEND SS 6F .021 (SHEATH) ×2 IMPLANT
KIT ENCORE 26 ADVANTAGE (KITS) ×1 IMPLANT
KIT HEART LEFT (KITS) ×2 IMPLANT
PACK CARDIAC CATHETERIZATION (CUSTOM PROCEDURE TRAY) ×2 IMPLANT
STENT PROMUS PREM MR 3.0X20 (Permanent Stent) ×1 IMPLANT
SYR MEDRAD MARK V 150ML (SYRINGE) ×2 IMPLANT
TRANSDUCER W/STOPCOCK (MISCELLANEOUS) ×2 IMPLANT
TUBING CIL FLEX 10 FLL-RA (TUBING) ×2 IMPLANT
TUBING CONTRAST HIGH PRESS 48 (TUBING) ×1 IMPLANT
WIRE COUGAR XT STRL 190CM (WIRE) ×1 IMPLANT
WIRE HI TORQ VERSACORE-J 145CM (WIRE) ×1 IMPLANT
WIRE SAFE-T 1.5MM-J .035X260CM (WIRE) ×2 IMPLANT

## 2014-12-24 NOTE — Progress Notes (Signed)
[place] Progress Note Patient Name: Angelle Isais DOB: [birth year] MRN: [id number]   Date of Service  12/24/2014  HPI/Events of Note  Vomiting. Antiemetic order requested. QTc interval = 440 milliseconds.  eICU Interventions  Will order: 1. Zofran 4 mg IV Q 6 hours PRN N/V. 2. Monitor QTc interval Q 6 hours. Notify MD if QTc interval > 500 milliseconds.     Intervention Category Intermediate Interventions: Other:  Antone Summons Cornelia Copa 12/24/2014, 10:45 PM

## 2014-12-24 NOTE — H&P (Signed)
ADMISSION HISTORY AND PHYSICAL   Date: 12/24/2014               Patient Name:  Rachel Park MRN: [id number]  DOB: [birth year] Age / Sex: 81 y.o., female        PCP: No primary care provider on file. Primary Cardiologist: Nahser         History of Present Illness: Patient is a 81 y.o. female with hx of hypothyroidism, who was admitted to [facility] on 12/24/2014 for evaluation of  Exertional dyspnea.  For the past 6 weeks, she has been having exertional chest tightness .  Walks regularly. Has waked for 11 years.   Walks 6 times a week, several miles a day.  Has exertional chest pressure, Weakness in left arm. Normally walks 35 - 40 minutes.  Has these episodes of DOE and left arm pain within 5 minutes.  Some radiation up to her neck .  Pain is a pressure / tightness.  Feels like a bubble in her chest   Father has hx of CAD - early 40s Lipids are a little high,  HDL is very high. Eats a good diet Never smoked.    Medications: Outpatient medications:  (Not in a hospital admission)  No Known Allergies   Past Medical History  Diagnosis Date  . Complication of anesthesia   . PONV (postoperative nausea and vomiting)   . Hypothyroidism   . Vertigo, benign positional     to be evaluated, intermittent    Past Surgical History  Procedure Laterality Date  . Abdominal hysterectomy    . Tonsillectomy    . Cataract extraction Bilateral   . Colonoscopy with propofol N/A 12/01/2013    Procedure: COLONOSCOPY WITH PROPOFOL;  Surgeon: Garlan Fair, MD;  Location: [facility] ENDOSCOPY;  Service: Endoscopy;  Laterality: N/A;    History reviewed. No pertinent family history.  Social History:  reports that she has never smoked. She does not have any smokeless tobacco history on file. She reports that she drinks alcohol. She reports that she does not use illicit drugs.   Review of Systems: Constitutional:  denies fever, chills, diaphoresis, appetite change and fatigue.  HEENT:  denies photophobia, eye pain, redness, hearing loss, ear pain, congestion, sore throat, rhinorrhea, sneezing, neck pain, neck stiffness and tinnitus.  Respiratory: denies SOB, DOE, cough, chest tightness, and wheezing.  Cardiovascular: admits to chest pain, chest pressure / tightness   Gastrointestinal: denies nausea, vomiting, abdominal pain, diarrhea, constipation, blood in stool.  Genitourinary: denies dysuria, urgency, frequency, hematuria, flank pain and difficulty urinating.  Musculoskeletal: denies  myalgias, back pain, joint swelling, arthralgias and gait problem.   Skin: denies pallor, rash and wound.  Neurological: denies dizziness, seizures, syncope, weakness, light-headedness, numbness and headaches.   Hematological: denies adenopathy, easy bruising, personal or family bleeding history.  Psychiatric/ Behavioral: denies suicidal ideation, mood changes, confusion, nervousness, sleep disturbance and agitation.    Physical Exam: BP 192/89 mmHg  Pulse 74  Temp(Src) 98.3 F (36.8 C) (Oral)  Resp 23  SpO2 98%  Wt Readings from Last 3 Encounters:  12/01/13 58.968 kg (130 lb)    General: Vital signs reviewed and noted. Well-developed, well-nourished, in no acute distress; alert,   Head: Normocephalic, atraumatic, sclera anicteric, mucus membranes are moist   Neck: Supple. Negative for carotid bruits. JVD not elevated.   Lungs:  Clear bilaterally to auscultation without wheezes, rales, or rhonchi. Breathing is normal   Heart: RRR with S1 S2. Soft  systolic murmur, no  rubs, or gallops.   Abdomen:  Soft, non-tender, non-distended with normoactive bowel sounds. No hepatomegaly. No rebound/guarding. No obvious abdominal masses   MSK: Strength and the appear normal for age.   Extremities: No clubbing or cyanosis. No edema.  Distal pedal pulses are 2+ and equal bilaterally .  Neurologic: Alert and oriented X 3. Moves all extremities spontaneously   Psych:  normal     Lab  results: Basic Metabolic Panel:  Recent Labs Lab 12/24/14 1041  NA 138  K 4.1  CL 106  CO2 25  GLUCOSE 103*  BUN 20  CREATININE 0.56  CALCIUM 9.2    Liver Function Tests:  Recent Labs Lab 12/24/14 1041  AST 39  ALT 24  ALKPHOS 57  BILITOT 0.5  PROT 6.1*  ALBUMIN 3.7    Recent Labs Lab 12/24/14 1041  LIPASE 26    CBC:  Recent Labs Lab 12/24/14 1041  WBC 5.9  NEUTROABS 3.8  HGB 12.6  HCT 37.8  MCV 95.0  PLT 234    Cardiac Enzymes:  Recent Labs Lab 12/24/14 1041  TROPONINI <0.03    BNP: Invalid input(s): POCBNP  CBG: No results for input(s): GLUCAP in the last 168 hours.  Coagulation Studies:  Recent Labs  12/24/14 1041  LABPROT 13.1  INR 0.97     Other results:  EKG :  NSR , no ST or T wave changes   Imaging: Dg Chest 2 View  12/24/2014  CLINICAL DATA:  Chest pain for 6 weeks. EXAM: CHEST  2 VIEW COMPARISON:  None. FINDINGS: The heart size and mediastinal contours are within normal limits. Both lungs are clear. Pulmonary hyperinflation, consistent with COPD. No evidence of pneumothorax or pleural effusion. IMPRESSION: COPD.  No active disease. Electronically Signed   By: Earle Gell M.D.   On: 12/24/2014 11:42      Assessment & Plan:  1. Unstable angina: [name] presents with symptoms consistent with angina. She's been relatively healthy all of her life. She has walked 2-3 miles every day for the past 11 years. 6 weeks ago she started having exertional chest discomfort.  This discomfort is described as a chest pressure. It radiates out to her left arm and perhaps up into her neck. There is slight shortness of breath.  On occasion she is able to walk through this pain but mostly, causes her to stop walking and returned home.  She was in her primary medical doctor's office today and had an episode of chest pain at rest.  It's possible that some of her symptoms may be due to high blood pressure. Her blood pressure is typically  in the normal range although it is noted to be elevated today.  At this point I think that our best option is to proceed with cardiac catheterization.  We discussed the risks, benefits, and options concerning cardiac catheterization. She understands and agrees to proceed.  2. Hypothyroidism: We'll check a TSH today. Continue current dose of Synthroid.  3. Essential hypertension: Her blood pressure is typically normal. It is elevated today but this might be due to the anxiety of being referred to the emergency room.  4. Hyperlipidemia:  Lipids are a little elevated.  She says her HDL is very high and the LDL is slightly elevated  Will check lipid profile tomorrow  DVT PPX -    Thayer Headings, Brooke Bonito., MD, [facility] 12/24/2014, 12:29 PM

## 2014-12-24 NOTE — Interval H&P Note (Signed)
History and Physical Interval Note:  12/24/2014 5:38 PM  Rachel Park  has presented today for surgery, with the diagnosis of cp  The various methods of treatment have been discussed with the patient and family. After consideration of risks, benefits and other options for treatment, the patient has consented to  Procedure(s): Left Heart Cath and Coronary Angiography (N/A) as a surgical intervention .  The patient's history has been reviewed, patient examined, no change in status, stable for surgery.  I have reviewed the patient's chart and labs.  Questions were answered to the patient's satisfaction.    Cath Lab Visit (complete for each Cath Lab visit)  Clinical Evaluation Leading to the Procedure:   ACS: Yes.    Non-ACS:    Anginal Classification: CCS IV  Anti-ischemic medical therapy: Minimal Therapy (1 class of medications)  Non-Invasive Test Results: No non-invasive testing performed  Prior CABG: No previous CABG       Sherren Mocha

## 2014-12-24 NOTE — Progress Notes (Signed)
Pt with acute onset of neurological changes. Pt, who was completely A&O at shift change, began complaining of headache at 2020. Pt initially refused medication and simply requested repositioning. Later pt did request medication as pain progressed in severity. Charge RN called this nurse to the room at 2215 stating that pt was having trouble remembering her name, the location, and the name of her son and daughter in law. Full neurological assessment performed at this time. See neurology assessment under flowsheets. MD on call notified at this time

## 2014-12-24 NOTE — Progress Notes (Signed)
Patient developed some expressive aphasia around 10:00 tonight and code stroke was called.  CT scan evidently was unremarkable for an acute bleed or acute event although a lacunar stroke was noted in the left basal ganglion.  Dr. Katherine Roan has seen the patient for neurology.  Symptoms have been somewhat waxing and waning of her somewhat better now.  Also complained of headache and had recurrent vomiting around 11:15.  No ischemic chest pain.  Complained of bubble type feeling in chest earlier after vomiting.  BP currently 179/81.  In sinus with PACs on monitor  Impression:  CVA occurring post stroke possibly embolic versus TIA  Recommendations:  Discussed with Dr. Katherine Roan.  Monitor at the present time.  Zofran for nausea.  Blood pressure to run higher because of recent stroke.  Continue antiplatelets.   Kerry Hough MD [facility]   .

## 2014-12-24 NOTE — ED Provider Notes (Signed)
CSN: [id number]     Arrival date & time 12/24/14  1021 History   First MD Initiated Contact with Patient 12/24/14 1029     Chief Complaint  Patient presents with  . Chest Pain     (Consider location/radiation/quality/duration/timing/severity/associated sxs/prior Treatment) HPI Patient states for several weeks she has been noting chest tightness with exertion. She normally walks each morning 45 minutes to an hour. Typically that gives her no difficulty. Over about the past 2 weeks she started to notice tightness in her chest after walking for a period of time. Resolves with rest. She is also starting to notice when she goes to the gym that she develops tightness with activities it typically did not give her difficulty. Denies associated shortness of breath. She denies nausea or diaphoresis. No recent fever cough or chills. Eyes any lower extremity swelling or calf pain. She has no prior cardiac history. Patient was seen by her primary care physician today and during the evaluation reported some chest discomfort. Nitroglycerin was administered and she reports that completely resolved her chest discomfort. She denies any pain at this time. She reports she does not take blood pressure medicines. She states she monitors her blood pressure at home and it may run a little high. She estimates that sometimes the systolics are in the 825K. Past Medical History  Diagnosis Date  . Complication of anesthesia   . PONV (postoperative nausea and vomiting)   . Hypothyroidism   . Vertigo, benign positional     to be evaluated, intermittent   Past Surgical History  Procedure Laterality Date  . Abdominal hysterectomy    . Tonsillectomy    . Cataract extraction Bilateral   . Colonoscopy with propofol N/A 12/01/2013    Procedure: COLONOSCOPY WITH PROPOFOL;  Surgeon: Garlan Fair, MD;  Location: [facility];  Service: Endoscopy;  Laterality: N/A;   History reviewed. No pertinent family history. Social  History  Substance Use Topics  . Smoking status: Never Smoker   . Smokeless tobacco: None  . Alcohol Use: Yes     Comment: occ.   OB History    No data available     Review of Systems  10 Systems reviewed and are negative for acute change except as noted in the HPI.   Allergies  Review of patient's allergies indicates no known allergies.  Home Medications   Prior to Admission medications   Medication Sig Start Date End Date Taking? Authorizing Provider  Biotin 5 MG CAPS Take 5 mg by mouth daily.   Yes Historical Provider, MD  Calcium Carb-Cholecalciferol (CALCIUM 600 + D PO) Take 1 tablet by mouth daily.   Yes Historical Provider, MD  cholecalciferol (VITAMIN D) 1000 UNITS tablet Take 1,000 Units by mouth daily.   Yes Historical Provider, MD  levothyroxine (SYNTHROID, LEVOTHROID) 75 MCG tablet Take 75 mcg by mouth daily before breakfast.   Yes Historical Provider, MD  Multiple Vitamin (MULTIVITAMIN WITH MINERALS) TABS tablet Take 1 tablet by mouth daily.   Yes Historical Provider, MD  NITROSTAT 0.4 MG SL tablet Place 0.4 mg under the tongue every 5 (five) minutes as needed. Chest pain 12/24/14  Yes Historical Provider, MD   BP 192/89 mmHg  Pulse 74  Temp(Src) 98.3 F (36.8 C) (Oral)  Resp 23  SpO2 98% Physical Exam  Constitutional: She is oriented to person, place, and time. She appears well-developed and well-nourished.  HENT:  Head: Normocephalic and atraumatic.  Eyes: EOM are normal. Pupils are equal, round, and  reactive to light.  Neck: Neck supple.  Cardiovascular: Normal rate, regular rhythm, normal heart sounds and intact distal pulses.   Pulmonary/Chest: Effort normal and breath sounds normal.  Abdominal: Soft. Bowel sounds are normal. She exhibits no distension. There is no tenderness.  Musculoskeletal: Normal range of motion. She exhibits no edema or tenderness.  Neurological: She is alert and oriented to person, place, and time. She has normal strength.  Coordination normal. GCS eye subscore is 4. GCS verbal subscore is 5. GCS motor subscore is 6.  Skin: Skin is warm, dry and intact.  Psychiatric: She has a normal mood and affect.    ED Course  Procedures (including critical care time) Labs Review Labs Reviewed  COMPREHENSIVE METABOLIC PANEL - Abnormal; Notable for the following:    Glucose, Bld 103 (*)    Total Protein 6.1 (*)    All other components within normal limits  BRAIN NATRIURETIC PEPTIDE - Abnormal; Notable for the following:    B Natriuretic Peptide 100.3 (*)    All other components within normal limits  URINALYSIS, ROUTINE W REFLEX MICROSCOPIC (NOT AT [facility]) - Abnormal; Notable for the following:    Leukocytes, UA TRACE (*)    All other components within normal limits  LIPASE, BLOOD  TROPONIN I  CBC WITH DIFFERENTIAL/PLATELET  PROTIME-INR  URINE MICROSCOPIC-ADD ON    Imaging Review Dg Chest 2 View  12/24/2014  CLINICAL DATA:  Chest pain for 6 weeks. EXAM: CHEST  2 VIEW COMPARISON:  None. FINDINGS: The heart size and mediastinal contours are within normal limits. Both lungs are clear. Pulmonary hyperinflation, consistent with COPD. No evidence of pneumothorax or pleural effusion. IMPRESSION: COPD.  No active disease. Electronically Signed   By: Earle Gell M.D.   On: 12/24/2014 11:42   I have personally reviewed and evaluated these images and lab results as part of my medical decision-making.   EKG Interpretation   Date/Time:  Thursday December 24 2014 10:30:25 EDT Ventricular Rate:  63 PR Interval:  144 QRS Duration: 75 QT Interval:  407 QTC Calculation: 417 R Axis:   54 Text Interpretation:  Sinus rhythm Low voltage, precordial leads Confirmed  by Johnney Killian, MD, Jeannie Done [phone]) on 12/24/2014 12:34:09 PM      MDM   Final diagnoses:  Anginal pain ([place])  Essential hypertension   Patient presents with exertional chest pain consistent with angina. Dr. Katharina Caper or will admit the patient for planned  catheterization with Dr. Burt Knack. The patient is alert and appropriate. She is pain-free at this time. Patient is hypertensive without immediate signs of endorgan damage. Hypertension to be managed by cardiology.    Charlesetta Shanks, MD 12/24/14 1302

## 2014-12-24 NOTE — ED Notes (Signed)
Per EMS - pt intermittent chest pain in morning upon exertion, relieved by rest x 6 weeks. Seen at [facility] today to evaluate, began having CP at rest. Pt given 1 nitro, 324mg  aspirin with relief of pain. NSR on EKG. Denies any associated symptoms w/ CP. 20G IV

## 2014-12-24 NOTE — Progress Notes (Signed)
Call received per floor RN  At 2227 regarding Pt post PCI with acute confusion and aphasia. Upon my arrival Pt found in bed complaining of a headache. Per floor RN Pt returned from PCI today at shift change 1900, at 2000 she began complaining of a headache other wise she was in her normal state. At 2200 while assessing Patients radial band RN assessed Pt with acute confusion and inability to find words. LSN was initially determined to be 2000  NIHSS completed yielding score of 3 for acute confusion and aphasia. Code Stroke called, CT delayed shortly for Pt projectile vomiting. Zofran 4mg  IVP given and Pt taken to CT scan. CT completed and reviewed per Dr. Leonel Ramsay. Upon arrival back to room Pt confusion resolved and aphasia improved. Pt admits to having trouble finding words upon awakening after PCI. LSN determined to be 1700 prior to going to Cath Lab. Dr. Wynonia Lawman paged and updated per floor RN, MD to bedside to assess Pt after CT scan. Floor RN advised to monitor Pt closely and notify myself and Provider for worsening changes. Plan of care discussed with family at bedsdie.

## 2014-12-24 NOTE — ED Notes (Signed)
EDP aware pt BP elevated

## 2014-12-25 ENCOUNTER — Inpatient Hospital Stay (HOSPITAL_COMMUNITY): Payer: Medicare Other

## 2014-12-25 ENCOUNTER — Encounter (HOSPITAL_COMMUNITY): Payer: Self-pay | Admitting: Cardiovascular Disease

## 2014-12-25 DIAGNOSIS — I6789 Other cerebrovascular disease: Secondary | ICD-10-CM

## 2014-12-25 DIAGNOSIS — I6340 Cerebral infarction due to embolism of unspecified cerebral artery: Secondary | ICD-10-CM | POA: Diagnosis not present

## 2014-12-25 DIAGNOSIS — E785 Hyperlipidemia, unspecified: Secondary | ICD-10-CM

## 2014-12-25 DIAGNOSIS — I63139 Cerebral infarction due to embolism of unspecified carotid artery: Secondary | ICD-10-CM

## 2014-12-25 LAB — CBC
HEMATOCRIT: 39 % (ref 36.0–46.0)
HEMOGLOBIN: 13.1 g/dL (ref 12.0–15.0)
MCH: 31.8 pg (ref 26.0–34.0)
MCHC: 33.6 g/dL (ref 30.0–36.0)
MCV: 94.7 fL (ref 78.0–100.0)
Platelets: 245 10*3/uL (ref 150–400)
RBC: 4.12 MIL/uL (ref 3.87–5.11)
RDW: 13.4 % (ref 11.5–15.5)
WBC: 12.8 10*3/uL — AB (ref 4.0–10.5)

## 2014-12-25 LAB — LIPID PANEL
CHOLESTEROL: 222 mg/dL — AB (ref 0–200)
HDL: 85 mg/dL (ref >40)
LDL Cholesterol: 127 mg/dL — ABNORMAL HIGH (ref 0–99)
TRIGLYCERIDES: 51 mg/dL (ref <150)
Total CHOL/HDL Ratio: 2.6 RATIO
VLDL: 10 mg/dL (ref 0–40)

## 2014-12-25 LAB — BASIC METABOLIC PANEL
ANION GAP: 11 (ref 5–15)
BUN: 8 mg/dL (ref 6–20)
CHLORIDE: 105 mmol/L (ref 101–111)
CO2: 22 mmol/L (ref 22–32)
Calcium: 9.1 mg/dL (ref 8.9–10.3)
Creatinine, Ser: 0.55 mg/dL (ref 0.44–1.00)
GFR calc Af Amer: >60 mL/min (ref >60)
GLUCOSE: 111 mg/dL — AB (ref 65–99)
POTASSIUM: 3.6 mmol/L (ref 3.5–5.1)
Sodium: 138 mmol/L (ref 135–145)

## 2014-12-25 LAB — POCT ACTIVATED CLOTTING TIME: ACTIVATED CLOTTING TIME: 399 s

## 2014-12-25 MED ORDER — TRAMADOL HCL 50 MG PO TABS
50.0000 mg | ORAL_TABLET | Freq: Once | ORAL | Status: AC
  Administered 2014-12-25: 50 mg via ORAL
  Filled 2014-12-25: qty 1

## 2014-12-25 MED ORDER — IOHEXOL 350 MG/ML SOLN: 50.0000 mL | Freq: Once | INTRAVENOUS | Status: DC | PRN

## 2014-12-25 MED ORDER — STROKE: EARLY STAGES OF RECOVERY BOOK
Freq: Once | Status: DC
  Filled 2014-12-25: qty 1

## 2014-12-25 MED FILL — Lidocaine HCl Local Preservative Free (PF) Inj 1%: INTRAMUSCULAR | Qty: 30 | Status: AC

## 2014-12-25 NOTE — Progress Notes (Signed)
Utilization Review Completed.  

## 2014-12-25 NOTE — Consult Note (Addendum)
Neurology Consultation Reason for Consult: Aphasia Referring Physician: Code stroke (Nahser, P attending)  CC: Aphasia  History is obtained from: Patient, nurses  HPI: Rachel Park is a 81 y.o. female who underwent cardiac catheterization today for symptomatic carotid disease. She had a percutaneous intervention with drug-eluting stent. She states that following this, she immediately noticed that she had some difficulty with answering questions whether the symptoms were very mild. Nursing then noticed that she had a significant worsening around 10 PM. Code stroke was therefore called.  By the time of my arrival, the patient had artery had considerable improvement though she still had a clear anomic aphasia with impaired repetition. Her fluency otherwise appeared relatively intact.  Last known well: Prior to procedure at 5:30 PM. tpa given: no, mild symptoms.   ROS: A 14 point ROS was performed and is negative except as noted in the HPI.   Past Medical History  Diagnosis Date  . Complication of anesthesia   . PONV (postoperative nausea and vomiting)   . Hypothyroidism   . Vertigo, benign positional     to be evaluated, intermittent     Father: Coronary disease   Social History:  reports that she has never smoked. She does not have any smokeless tobacco history on file. She reports that she drinks alcohol. She reports that she does not use illicit drugs.   Exam: Current vital signs: BP 179/81 mmHg  Pulse 86  Temp(Src) 98.4 F (36.9 C) (Oral)  Resp 15  Ht 5' 1.5" (1.562 m)  Wt 56.6 kg (124 lb 12.5 oz)  BMI 23.20 kg/m2  SpO2 100% Vital signs in last 24 hours: Temp:  [97.8 F (36.6 C)-98.4 F (36.9 C)] 98.4 F (36.9 C) (10/20 2300) Pulse Rate:  [0-91] 86 (10/20 2310) Resp:  [0-100] 15 (10/20 2310) BP: (103-201)/(57-157) 179/81 mmHg (10/20 2310) SpO2:  [0 %-100 %] 100 % (10/20 2310) Weight:  [56.6 kg (124 lb 12.5 oz)] 56.6 kg (124 lb 12.5 oz) (10/20 1530)   Physical  Exam  Constitutional: Appears well-developed and well-nourished.  Psych: Affect appropriate to situation Eyes: No scleral injection HENT: No OP obstrucion Head: Normocephalic.  Cardiovascular: Normal rate  Respiratory: Effort normal  GI: Soft.  No distension. There is no tenderness.  Skin: WDI  Neuro: Mental Status: Patient is awake, alert, oriented to person, place, month, age, and situation. Patient is able to give a clear and coherent history. No signs of neglect. She is unable to name a pen and frequently has difficulty with naming objects. She has relatively preserved fluency otherwise. She has impaired repetition Cranial Nerves: II: Visual Fields are full. Pupils are equal, round, and reactive to light.   III,IV, VI: EOMI without ptosis or diploplia.  V: Facial sensation is symmetric to temperature VII: Facial movement is symmetric.  VIII: hearing is intact to voice X: Uvula elevates symmetrically XI: Shoulder shrug is symmetric. XII: tongue is midline without atrophy or fasciculations.  Motor: Tone is normal. Bulk is normal. 5/5 strength was present in all four extremities.  Sensory: Sensation is symmetric to light touch and temperature in the arms and legs. Cerebellar: FNF  intact bilaterally  I have reviewed labs in epic and the results pertinent to this consultation are: CMP-unremarkable  I have reviewed the images obtained: CT head-previous infarct, but no acute findings  Impression: 81 year old female with likely small cortical infarct. She is already being managed for her coronary disease in many of the same secondary stroke prevention guidelines would be  similar to this given that she does have an old basal ganglia infarct as well.  Recommendations: 1. HgbA1c, fasting lipid panel 2. MRI, MRA  of the brain without contrast when safe to do so from cardiac stenting perspective. MRA neck w/wo contrast 3. Frequent neuro checks 4. Echocardiogram 5. Prophylactic  therapy-dual antiplatelet therapy is indicated for stent. 6. Risk factor modification 7. Telemetry monitoring 8. Speech consult for aphasia, no physical deficits noted that would need PT/OT 9. If it will not be safe in the immediate time period to obtain an MRI, then I would favor getting a CTA head and neck when she has had some chance to clear the contrast load she got earlier today. I think it is unlikely to change management in the acute setting unless she were to worsen again.   Roland Rack, MD [facility] [phone]  If 7pm- 7am, please page neurology on call as listed in [place].

## 2014-12-25 NOTE — Progress Notes (Signed)
  Echocardiogram 2D Echocardiogram has been performed.  Darlina Sicilian M 12/25/2014, 11:47 AM

## 2014-12-25 NOTE — Progress Notes (Signed)
STROKE TEAM PROGRESS NOTE   HISTORY Rachel Park is a 81 y.o. female who underwent cardiac catheterization today for symptomatic cardiac disease. She had a percutaneous intervention with drug-eluting stent. She states that following this, she immediately noticed that she had some difficulty with answering questions whether the symptoms were very mild. Nursing then noticed that she had a significant worsening around 10 PM. Code stroke was therefore called. LKW 12/24/2014 at 1730.  By the time of my arrival, the patient had artery had considerable improvement though she still had a clear anomic aphasia with impaired repetition. Her fluency otherwise appeared relatively intact.  Patient was not administered TPA secondary to mild symptoms.   SUBJECTIVE (INTERVAL HISTORY) No family is at the bedside.  Overall she feels her condition is rapidly improving. She is the mother-in-law of Dr. Donnetta Hutching   OBJECTIVE Temp:  [97.8 F (36.6 C)-100.4 F (38 C)] 98.3 F (36.8 C) (10/21 0759) Pulse Rate:  [0-91] 76 (10/21 0800) Cardiac Rhythm:  [-] Atrial fibrillation (10/21 0803) Resp:  [0-100] 16 (10/21 0800) BP: (92-201)/(45-157) 126/57 mmHg (10/21 0800) SpO2:  [0 %-100 %] 99 % (10/21 0800) Weight:  [56.6 kg (124 lb 12.5 oz)] 56.6 kg (124 lb 12.5 oz) (10/20 1530)  CBC:   Recent Labs Lab 12/24/14 1041 12/25/14 0308  WBC 5.9 12.8*  NEUTROABS 3.8  --   HGB 12.6 13.1  HCT 37.8 39.0  MCV 95.0 94.7  PLT 234 283    Basic Metabolic Panel:   Recent Labs Lab 12/24/14 1041 12/25/14 0308  NA 138 138  K 4.1 3.6  CL 106 105  CO2 25 22  GLUCOSE 103* 111*  BUN 20 8  CREATININE 0.56 0.55  CALCIUM 9.2 9.1    Lipid Panel:     Component Value Date/Time   CHOL 222* 12/25/2014 0308   TRIG 51 12/25/2014 0308   HDL 85 12/25/2014 0308   CHOLHDL 2.6 12/25/2014 0308   VLDL 10 12/25/2014 0308   LDLCALC 127* 12/25/2014 0308   HgbA1c: No results found for: HGBA1C Urine Drug Screen: No results found  for: LABOPIA, COCAINSCRNUR, LABBENZ, AMPHETMU, THCU, LABBARB    IMAGING  Dg Chest 2 View 12/24/2014  COPD.  No active disease.   Ct Head Wo Contrast 12/24/2014   1. Atrophy and chronic small vessel ischemia. Small lacunar infarct in left basal ganglia. 2. No CT evidence of acute ischemia.   CTA Head and neck pending  2D echo pending    PHYSICAL EXAM Pleasant frail elderly Caucasian lady currently not in distress. . Afebrile. Head is nontraumatic. Neck is supple without bruit.    Cardiac exam no murmur or gallop. Lungs are clear to auscultation. Distal pulses are well felt.  Neurological Exam ;  Awake  Alert oriented x 3. Normal speech and language.eye movements able to name objects quite well.Diminished animal naming 8 only.Extraocular movements are full without nystagmus.fundi were not visualized. Vision acuity and fields appear normal. Hearing is normal. Palatal movements are normal. Face symmetric. Tongue midline. Normal strength, tone, reflexes and coordination. Normal sensation. Gait deferred. ASSESSMENT/PLAN Ms. Rachel Park is a 81 y.o. female with history of hypothyroidism s/p cardiac cath for symptomatic cardiac disease w/ drug eluding stent placed who developed anomic aphasia with impaired repetition the day of procedure. She did not receive IV t-PA due to mild symptoms.   Stroke: left subinsular cortex infarct embolic most likely a result status post PCI/drug eluding cardiac stent placement   Resultant  Aphasia resolving, still difficulty  naming animals 7-8 in a 1 min time frame, expect 15  MRI/MRA in 6 weeks per cardiology  CTA head and neck to look at vasculature, follow up for stroke  Carotid Doppler  Canceled and replaced with CTA neck given need for CTA head as well   2D Echo  ordered  LDL 127  HgbA1c ordered  No orders for VTE prophylaxis. SCDs added Diet clear liquid Room service appropriate?: Yes; Fluid consistency:: Thin  No antithrombotic prior to  admission, now on aspirin 81 mg daily and clopidogrel 75 mg daily  Patient counseled to be compliant with her antithrombotic medications  Ongoing aggressive stroke risk factor management  Therapy recommendations:  ST pending. No PT or OT needs identified   Disposition:  Anticipate return home  Malignant Hypertension  Improving  As high as 201/157 within the past 24h Permissive hypertension (OK if < 220/120) but gradually normalize in 5-7 days  Hyperlipidemia  Home meds:  No statin  LDL 127, goal < 70  Now on lipitor 40 mg daily  Continue statin at discharge  Other Stroke Risk Factors  Advanced age  ETOH use  Coronary Artery Disease d/p PCI w/ stent 12/24/2014  Other Active Problems  hypothyroidism  Hospital day # [place] for Pager information 12/25/2014 10:16 AM   I have personally examined this patient, reviewed notes, independently viewed imaging studies, participated in medical decision making and plan of care. I have made any additions or clarifications directly to the above note. Agree with note above. She had transient headache and word finding difficulties a few hours after cardiac interventional procedure possibly small left hemispheric infarct not visualized on CT scan. She remains at risk for neurological worsening, recurrent stroke, TIA needs ongoing evaluation. Check CT angiogram of the brain and neck. Continue aspirin and Plavix given recent cardiac stent. I had a long discussion with the patient as well as her son and Dr. Donnetta Hutching whom I spoke to  over the phone and answered questions.  Antony Contras, MD Medical Director [place] Pager: [phone] 12/25/2014 4:38 PM   To contact Stroke Continuity provider, please refer to [email or web address] After hours, contact General Neurology

## 2014-12-25 NOTE — Progress Notes (Signed)
CARDIAC REHAB PHASE I   PRE:  Rate/Rhythm: 66 sR    BP: sitting 132/66    SaO2: 100 RA  MODE:  Ambulation: 270 ft   POST:  Rate/Rhythm: 76 SR    BP: sitting 145/80     SaO2: 100 RA   Pt slightly off balance walking to BR. [place] with distance and encouraging her to relax. Tired toward end of walk. VSS. Ed completed with good understanding. Requests referral be sent to [facility]. Pt eager to get back to her walking every morning. Pt had delay in remembering her face cream but did remember the name after 20 sec or so.  [place], [place], ACSM 12/25/2014 12:32 PM

## 2014-12-25 NOTE — Progress Notes (Addendum)
PROGRESS NOTE  Subjective:   Rachel Park had PCI of her prox LAD yesterday  Complicated by a possible CVA / TIA following the procedure. Had expressive aphasia. Original thoughts were that the mental status changes were due to versed and [facility] given during the procedure.   Symptoms have resolved this am     Objective:    Vital Signs:   Temp:  [97.8 F (36.6 C)-100.4 F (38 C)] 99.1 F (37.3 C) (10/21 0700) Pulse Rate:  [0-91] 73 (10/21 0620) Resp:  [0-100] 23 (10/21 0620) BP: (92-201)/(45-157) 109/53 mmHg (10/21 0620) SpO2:  [0 %-100 %] 99 % (10/21 0620) Weight:  [124 lb 12.5 oz (56.6 kg)] 124 lb 12.5 oz (56.6 kg) (10/20 1530)  Last BM Date: 12/24/14   24-hour weight change: Weight change:   Weight trends: Filed Weights   12/24/14 1530  Weight: 124 lb 12.5 oz (56.6 kg)    Intake/Output:  10/20 0701 - 10/21 0700 In: 400 [I.V.:400] Out: 1525 [Urine:1525]     Physical Exam: BP 109/53 mmHg  Pulse 73  Temp(Src) 99.1 F (37.3 C) (Oral)  Resp 23  Ht 5' 1.5" (1.562 m)  Wt 124 lb 12.5 oz (56.6 kg)  BMI 23.20 kg/m2  SpO2 99%  Wt Readings from Last 3 Encounters:  12/24/14 124 lb 12.5 oz (56.6 kg)  12/01/13 130 lb (58.968 kg)    General: Vital signs reviewed and noted.   Head: Normocephalic, atraumatic.  Eyes: conjunctivae/corneas clear.  EOM's intact.   Throat: normal  Neck:  normal   Lungs:   Clear   Heart:  RR,   Abdomen:  Soft, non-tender, non-distended    Extremities: No edema . Pulses are ok    Neurologic: A&O X3, CN II - XII are grossly intact. Motor and sensory function are grossly intact.   Memory seems to be normal .    Psych: Normal     Labs: BMET:  Recent Labs  12/24/14 1041 12/25/14 0308  NA 138 138  K 4.1 3.6  CL 106 105  CO2 25 22  GLUCOSE 103* 111*  BUN 20 8  CREATININE 0.56 0.55  CALCIUM 9.2 9.1    Liver function tests:  Recent Labs  12/24/14 1041  AST 39  ALT 24  ALKPHOS 57  BILITOT 0.5  PROT 6.1*  ALBUMIN  3.7    Recent Labs  12/24/14 1041  LIPASE 26    CBC:  Recent Labs  12/24/14 1041 12/25/14 0308  WBC 5.9 12.8*  NEUTROABS 3.8  --   HGB 12.6 13.1  HCT 37.8 39.0  MCV 95.0 94.7  PLT 234 245    Cardiac Enzymes:  Recent Labs  12/24/14 1041 12/24/14 1420  TROPONINI <0.03 <0.03    Coagulation Studies:  Recent Labs  12/24/14 1041  LABPROT 13.1  INR 0.97    Other: Invalid input(s): POCBNP No results for input(s): DDIMER in the last 72 hours. No results for input(s): HGBA1C in the last 72 hours.  Recent Labs  12/25/14 0308  CHOL 222*  HDL 85  LDLCALC 127*  TRIG 51  CHOLHDL 2.6    Recent Labs  12/24/14 1420  TSH 2.481   No results for input(s): VITAMINB12, FOLATE, FERRITIN, TIBC, IRON, RETICCTPCT in the last 72 hours.   Other results:  EKG :  NSR at 83.  T wave abn anteriorly  - new from yesterday     Medications:    Infusions:    Scheduled Medications: .  stroke: mapping our early stages of recovery book   Does not apply Once  . aspirin EC  81 mg Oral Daily  . atorvastatin  40 mg Oral q1800  . calcium-vitamin D  1 tablet Oral Q breakfast  . carvedilol  3.125 mg Oral BID WC  . clopidogrel  75 mg Oral Q breakfast  . levothyroxine  75 mcg Oral QAC breakfast  . sodium chloride  3 mL Intravenous Q12H    Assessment/ Plan:   Active Problems:   Unstable angina (HCC)  1. CAD - s/p DES ( promus ) stenting of her prox LAD   Ost RCA lesion, 30% stenosed.  Ost LM to LM lesion, 30% stenosed.  Mid Cx lesion, 25% stenosed.  Dist LAD lesion, 40% stenosed.  Prox LAD lesion, 90% stenosed. Post intervention, there is a 0% residual stenosis.  The left ventricular systolic function is normal.  The prox LAD  predilated with a 2.5 mm balloon, stented with a 3.0x20 mm Promus DES, and the stent was post-dilated with a 3.25 mm noncompliant balloon to 16 atm.  No further angina .  Troponins are negative. ECG shows some NS changes .   2. Possible  CVA / TIA:  Her symptoms have completely ( almost completely) resolved.    May be due to the sedation meds used during the procedure or may be due to plaque embolization during the procedure  We have checked with the stent companies.  It will be OK to get an MRI / MRA if neuro feels that it is necessary . CT did not show an intracranial hemorrhage.   Check carotid duplex Continue ASA and plavix Will continue low dose Coreg.  I do not think that will cause a watershed CVA. BP is normal . Will delay DC until tomorrow.   3.  Hyperlipidemia:   Total cholesterol = 222  HDL is 85 LDL = 127  Has been started on Atorvastatin 40 mg a day   4. Hypothyroidism:  Continue synthroid TSH = 2.48    Disposition:  Length of Stay: 1  Rachel Park, Brooke Bonito., MD, [facility] 12/25/2014, 7:45 AM Office [phone] Pager [phone]

## 2014-12-26 DIAGNOSIS — J449 Chronic obstructive pulmonary disease, unspecified: Secondary | ICD-10-CM | POA: Diagnosis present

## 2014-12-26 DIAGNOSIS — E785 Hyperlipidemia, unspecified: Secondary | ICD-10-CM | POA: Diagnosis present

## 2014-12-26 DIAGNOSIS — E039 Hypothyroidism, unspecified: Secondary | ICD-10-CM | POA: Diagnosis present

## 2014-12-26 DIAGNOSIS — I63132 Cerebral infarction due to embolism of left carotid artery: Secondary | ICD-10-CM

## 2014-12-26 DIAGNOSIS — I2510 Atherosclerotic heart disease of native coronary artery without angina pectoris: Secondary | ICD-10-CM

## 2014-12-26 LAB — HEMOGLOBIN A1C
HEMOGLOBIN A1C: 5.4 % (ref 4.8–5.6)
MEAN PLASMA GLUCOSE: 108 mg/dL

## 2014-12-26 MED ORDER — NITROSTAT 0.4 MG SL SUBL: 0.4000 mg | SUBLINGUAL_TABLET | SUBLINGUAL | Status: DC | PRN

## 2014-12-26 MED ORDER — POTASSIUM CHLORIDE CRYS ER 20 MEQ PO TBCR
20.0000 meq | EXTENDED_RELEASE_TABLET | Freq: Once | ORAL | Status: AC
  Administered 2014-12-26: 20 meq via ORAL
  Filled 2014-12-26: qty 1

## 2014-12-26 MED ORDER — CARVEDILOL 3.125 MG PO TABS: 3.1250 mg | ORAL_TABLET | Freq: Two times a day (BID) | ORAL | Status: DC

## 2014-12-26 MED ORDER — ACETAMINOPHEN 325 MG PO TABS: 650.0000 mg | ORAL_TABLET | Freq: Four times a day (QID) | ORAL | Status: DC | PRN

## 2014-12-26 MED ORDER — CLOPIDOGREL BISULFATE 75 MG PO TABS: 75.0000 mg | ORAL_TABLET | Freq: Every day | ORAL | Status: DC

## 2014-12-26 MED ORDER — ASPIRIN 81 MG PO TBEC: 81.0000 mg | DELAYED_RELEASE_TABLET | Freq: Every day | ORAL | Status: DC

## 2014-12-26 MED ORDER — ATORVASTATIN CALCIUM 40 MG PO TABS: 40.0000 mg | ORAL_TABLET | Freq: Every day | ORAL | Status: DC

## 2014-12-26 NOTE — Progress Notes (Signed)
STROKE TEAM PROGRESS NOTE   HISTORY Rachel Park is a 81 y.o. female who underwent cardiac catheterization today for symptomatic cardiac disease. She had a percutaneous intervention with drug-eluting stent. She states that following this, she immediately noticed that she had some difficulty with answering questions whether the symptoms were very mild. Nursing then noticed that she had a significant worsening around 10 PM. Code stroke was therefore called. LKW 12/24/2014 at 1730.  The patient had artery had considerable improvement rapidly though she still had a clear anomic aphasia with impaired repetition. Her fluency otherwise appeared relatively intact.  Patient was not administered TPA secondary to mild symptoms.   SUBJECTIVE (INTERVAL HISTORY) No family is present. The patient is alert, cooperative, walking with the nurse. The patient has no complaints currently, believes that her language ability is back to baseline. She is swallowing well.   OBJECTIVE Temp:  [97.6 F (36.4 C)-98.5 F (36.9 C)] 98.5 F (36.9 C) (10/22 0738) Pulse Rate:  [54-70] 70 (10/22 0738) Cardiac Rhythm:  [-] Normal sinus rhythm (10/22 0728) Resp:  [12-24] 20 (10/22 0738) BP: (86-145)/(44-89) 122/89 mmHg (10/22 0738) SpO2:  [96 %-99 %] 99 % (10/22 0738)  CBC:   Recent Labs Lab 12/24/14 1041 12/25/14 0308  WBC 5.9 12.8*  NEUTROABS 3.8  --   HGB 12.6 13.1  HCT 37.8 39.0  MCV 95.0 94.7  PLT 234 197    Basic Metabolic Panel:   Recent Labs Lab 12/24/14 1041 12/25/14 0308  NA 138 138  K 4.1 3.6  CL 106 105  CO2 25 22  GLUCOSE 103* 111*  BUN 20 8  CREATININE 0.56 0.55  CALCIUM 9.2 9.1    Lipid Panel:     Component Value Date/Time   CHOL 222* 12/25/2014 0308   TRIG 51 12/25/2014 0308   HDL 85 12/25/2014 0308   CHOLHDL 2.6 12/25/2014 0308   VLDL 10 12/25/2014 0308   LDLCALC 127* 12/25/2014 0308   HgbA1c:  Lab Results  Component Value Date   HGBA1C 5.4 12/25/2014   Urine Drug  Screen: No results found for: LABOPIA, COCAINSCRNUR, LABBENZ, AMPHETMU, THCU, LABBARB    IMAGING  Dg Chest 2 View 12/24/2014  COPD.  No active disease.   Ct Head Wo Contrast 12/24/2014   1. Atrophy and chronic small vessel ischemia. Small lacunar infarct in left basal ganglia. 2. No CT evidence of acute ischemia.   CTA Head and neck 12/25/14 IMPRESSION: 1. Stable age indeterminate infarct just inferior to the left lentiform nucleus. 2. Stable atrophy and white matter disease. 3. Moderate stenosis of the proximal left M1 segment with asymmetric attenuation of left MCA branch vessels compared to the right. 4. Mild to moderate distal right M1 segment stenosis. 5. Mild diffuse distal small vessel disease.  2D echo: Study Conclusions  - Left ventricle: The cavity size was normal. Wall thickness was normal. Systolic function was normal. The estimated ejection fraction was in the range of 55% to 60%. Wall motion was normal; there were no regional wall motion abnormalities. There was no evidence of elevated ventricular filling pressure by Doppler parameters. - Mitral valve: Calcified annulus.  Impressions:  - No cardiac source of emboli was indentified.  Physical Exam  General: The patient is alert and cooperative at the time of the examination.  Respiratory: Lung fields are clear  Cardiovascular: Regular rate and rhythm  Skin: No significant peripheral edema is noted.   Neurologic Exam  Mental status: The patient is alert and oriented x 3  at the time of the examination. The patient has apparent normal recent and remote memory, with an apparently normal attention span and concentration ability.   Cranial nerves: Facial symmetry is present. Speech is normal, no aphasia or dysarthria is noted. Extraocular movements are full. Visual fields are full.  Motor: The patient has good strength in all 4 extremities.  Sensory examination: Soft touch sensation is  symmetric on the face, arms, and legs.  Coordination: The patient has good finger-nose-finger and heel-to-shin bilaterally.  Gait and station: The patient has a normal gait. Tandem gait is normal. Romberg is negative. No drift is seen.  Reflexes: Deep tendon reflexes are symmetric.     ASSESSMENT/PLAN Ms. Rachel Park is a 81 y.o. female with history of hypothyroidism s/p cardiac cath for symptomatic cardiac disease w/ drug eluding stent placed who developed anomic aphasia with impaired repetition the day of procedure. She did not receive IV t-PA due to mild symptoms.   Stroke: left subinsular cortex infarct embolic most likely a result status post PCI/drug eluding cardiac stent placement   Resultant  Aphasia has resolved, speech is at or near baseline   MRI/MRA in 6 weeks per cardiology  CTA head and neck has been done, some middle cerebral artery atherosclerotic changes noted, no large artery stenosis  Carotid Doppler  Canceled and replaced with CTA neck given need for CTA head as well   2D Echo  is without cardiogenic source of embolus  LDL 127  HgbA1c 5.4  No orders for VTE prophylaxis. SCDs added Diet Heart Room service appropriate?: Yes; Fluid consistency:: Thin  No antithrombotic prior to admission, now on aspirin 81 mg daily and clopidogrel 75 mg daily  Patient counseled to be compliant with her antithrombotic medications  Ongoing aggressive stroke risk factor management  Therapy recommendations:  ST pending. No PT or OT needs identified   Disposition:  Anticipate return home  Malignant Hypertension  Improving Permissive hypertension (OK if < 220/120) but gradually normalize in 5-7 days  Hyperlipidemia  Home meds:  No statin  LDL 127, goal < 70  Now on lipitor 40 mg daily  Continue statin at discharge  Other Stroke Risk Factors  Advanced age  ETOH use  Coronary Artery Disease d/p PCI w/ stent 12/24/2014  Other Active  Problems  hypothyroidism  Hospital day # [place]   Phone [place] for Pager information 12/26/2014 8:53 AM   The patient is doing quite well at this time, she is at or near her usual neurologic baseline. She will remain on aspirin and Plavix, plans for discharge today, may follow-up with neurology within 6-8 weeks. Stroke team will sign off at this point.  [facility] Pager: [phone] 12/26/2014 8:53 AM   To contact Stroke Continuity provider, please refer to [email or web address] After hours, contact General Neurology

## 2014-12-26 NOTE — Progress Notes (Signed)
CARDIAC REHAB PHASE I   PRE:  Rate/Rhythm: 84 SR  BP:  Supine: 159/68  Sitting:   Standing:    SaO2: 100%RA  MODE:  Ambulation: 700 ft   POST:  Rate/Rhythm: 93 SR  BP:  Supine:   Sitting: 139/66  Standing:    SaO2: 98%RA 0825-0903 Pt walked 700 ft with steady gait with hand held asst.. Tolerated well. No CP. Pt stated no questions re ed done yesterday. Stressed importance of plavix with stent. Gave stent card. Assisted to bathroom and pt brushed teeth. To recliner with call bell. Speech in now.    Graylon Good, RN BSN  12/26/2014 9:00 AM

## 2014-12-26 NOTE — Progress Notes (Signed)
Subjective:  Awake and alert-"I slept well"  Objective:  Vital Signs in the last 24 hours: Temp:  [97.6 F (36.4 C)-98.3 F (36.8 C)] 98 F (36.7 C) (10/22 0333) Pulse Rate:  [54-76] 62 (10/22 0600) Resp:  [12-24] 16 (10/22 0600) BP: (86-145)/(44-80) 130/55 mmHg (10/22 0600) SpO2:  [96 %-99 %] 96 % (10/22 0600)  Intake/Output from previous day:  Intake/Output Summary (Last 24 hours) at 12/26/14 0734 Last data filed at 12/26/14 0340  Gross per 24 hour  Intake      0 ml  Output    200 ml  Net   -200 ml    Physical Exam: General appearance: alert, cooperative and no distress Neck: no carotid bruit and no JVD Lungs: clear to auscultation bilaterally Heart: regular rate and rhythm Extremities: extremities normal, atraumatic, no cyanosis or edema Skin: Skin color, texture, turgor normal. No rashes or lesions Neurologic: Grossly normal   Rate: 78  Rhythm: normal sinus rhythm  Lab Results:  Recent Labs  12/24/14 1041 12/25/14 0308  WBC 5.9 12.8*  HGB 12.6 13.1  PLT 234 245    Recent Labs  12/24/14 1041 12/25/14 0308  NA 138 138  K 4.1 3.6  CL 106 105  CO2 25 22  GLUCOSE 103* 111*  BUN 20 8  CREATININE 0.56 0.55    Recent Labs  12/24/14 1041 12/24/14 1420  TROPONINI <0.03 <0.03    Recent Labs  12/24/14 1041  INR 0.97    Scheduled Meds: .  stroke: mapping our early stages of recovery book   Does not apply Once  . aspirin EC  81 mg Oral Daily  . atorvastatin  40 mg Oral q1800  . calcium-vitamin D  1 tablet Oral Q breakfast  . carvedilol  3.125 mg Oral BID WC  . clopidogrel  75 mg Oral Q breakfast  . levothyroxine  75 mcg Oral QAC breakfast  . sodium chloride  3 mL Intravenous Q12H   Continuous Infusions:  PRN Meds:.sodium chloride, acetaminophen, hydrALAZINE, iohexol, lip balm, metoCLOPramide (REGLAN) injection, ondansetron (ZOFRAN) IV, oxyCODONE-acetaminophen, sodium chloride   Imaging: Ct Angio Head W/cm &/or Wo Cm  12/25/2014   CLINICAL DATA:  Code stroke. Difficulty speaking after cardiac catheterization yesterday. Abnormal CT scan. EXAM: CT ANGIOGRAPHY HEAD AND NECK TECHNIQUE: Multidetector CT imaging of the head and neck was performed using the standard protocol during bolus administration of intravenous contrast. Multiplanar CT image reconstructions and MIPs were obtained to evaluate the vascular anatomy. Carotid stenosis measurements (when applicable) are obtained utilizing NASCET criteria, using the distal internal carotid diameter as the denominator. CONTRAST:  50 mL Omnipaque 350 COMPARISON:  CT head without contrast 12/24/2014. FINDINGS: CT HEAD Brain: A 7 mm age indeterminate infarct is again seen just below the left lentiform nucleus. The basal ganglia are otherwise intact. No acute cortical infarct is present. Periventricular white matter changes are stable. Calvarium and skull base: Within normal limits Paranasal sinuses: Mild mucosal thickening and fluid is present in the maxillary sinuses bilaterally. The paranasal sinuses and the mastoid air cells are otherwise clear. Orbits: Bilateral lens replacements are present. The globes and orbits otherwise intact. CTA NECK Aortic arch: A 3 vessel arch configuration is present. Scattered calcifications are present. There is no significant focal stenosis or aneurysm. Right carotid system: The right common carotid artery is mildly tortuous. Calcifications are present at bifurcation without significant stenosis. There is tortuosity of the cervical right ICA without significant stenosis. Left carotid system: The left common  carotid artery is mildly tortuous as well. Calcifications are present at the bifurcation without significant stenosis. There is tortuosity of the cervical left ICA without significant stenosis. Vertebral arteries:Vertebral arteries both originate from the subclavian arteries. There is moderate tortuosity proximally on the left without significant stenosis. There is no  significant stenosis or focal vascular injury within either vertebral artery in the neck. Skeleton: Mild endplate degenerative changes are present throughout the cervical spine. Osseous foraminal narrowing is present on the left at C3-4, C4-5, and C5-6. No focal lytic or blastic lesions are present. Other neck: There is mild heterogeneity of the thyroid without a discrete lesion. No significant adenopathy is present. No focal mucosal or submucosal lesions are evident. Salivary glands are within normal limits. The lung apices are clear. CTA HEAD Anterior circulation: Minimal atherosclerotic calcifications are present within the cavernous internal carotid arteries bilaterally without significant stenosis. The ICA termini are within normal limits. The right A1 segment is dominant. The anterior communicating artery is patent. There is a moderate stenosis in the proximal left A1 segment relative to the distal vessel. The more distal right M1 stenosis is present. The bifurcations are intact. There is asymmetric attenuation of MCA branch vessels on the right. Mild distal small vessel disease is present throughout. Posterior circulation: The PICA origins are visualized and normal. The vertebrobasilar junction is within normal limits. The basilar artery is normal. The right posterior cerebral artery originates from the basilar tip. The left posterior cerebral artery is of fetal type. There is mild distal small vessel irregularity of the posterior cerebral arteries without a significant proximal stenosis. Venous sinuses: The dural sinuses are patent. The left transverse sinus is dominant. The straight sinus deep cerebral veins are intact. Cortical veins are unremarkable. Anatomic variants: Fetal type left posterior cerebral artery. Delayed phase: No pathologic enhancement is present. IMPRESSION: 1. Stable age indeterminate infarct just inferior to the left lentiform nucleus. 2. Stable atrophy and white matter disease. 3.  Moderate stenosis of the proximal left M1 segment with asymmetric attenuation of left MCA branch vessels compared to the right. 4. Mild to moderate distal right M1 segment stenosis. 5. Mild diffuse distal small vessel disease. Electronically Signed   By: San Morelle M.D.   On: 12/25/2014 16:47   Dg Chest 2 View  12/24/2014  CLINICAL DATA:  Chest pain for 6 weeks. EXAM: CHEST  2 VIEW COMPARISON:  None. FINDINGS: The heart size and mediastinal contours are within normal limits. Both lungs are clear. Pulmonary hyperinflation, consistent with COPD. No evidence of pneumothorax or pleural effusion. IMPRESSION: COPD.  No active disease. Electronically Signed   By: Earle Gell M.D.   On: 12/24/2014 11:42   Ct Head Wo Contrast  12/24/2014  ADDENDUM REPORT: 12/24/2014 23:17 ADDENDUM: These results were called by telephone at the time of interpretation on 12/24/2014 at 11:14 pm to Dr. Leonel Ramsay , who verbally acknowledged these results. Electronically Signed   By: Jeb Levering M.D.   On: 12/24/2014 23:17  12/24/2014  CLINICAL DATA:  Code stroke. Posterior headache with confusion and aphasia. EXAM: CT HEAD WITHOUT CONTRAST TECHNIQUE: Contiguous axial images were obtained from the base of the skull through the vertex without intravenous contrast. COMPARISON:  None. FINDINGS: Lacunar infarct in left basal ganglia. Mild atrophy and chronic small vessel ischemic change, normal for age. No intracranial hemorrhage, mass effect, or midline shift. No hydrocephalus. The basilar cisterns are patent. No evidence of territorial infarct. No intracranial fluid collection. Calvarium is intact. Mild mucosal thickening  of the maxillary sinuses. The mastoid air cells are well aerated. IMPRESSION: 1. Atrophy and chronic small vessel ischemia. Small lacunar infarct in left basal ganglia. 2. No CT evidence of acute ischemia. I am awaiting a return call from Dr. Leonel Ramsay Electronically Signed: By: Jeb Levering M.D.  On: 12/24/2014 23:14   Ct Angio Neck W/cm &/or Wo/cm  12/25/2014  CLINICAL DATA:  Code stroke. Difficulty speaking after cardiac catheterization yesterday. Abnormal CT scan. EXAM: CT ANGIOGRAPHY HEAD AND NECK TECHNIQUE: Multidetector CT imaging of the head and neck was performed using the standard protocol during bolus administration of intravenous contrast. Multiplanar CT image reconstructions and MIPs were obtained to evaluate the vascular anatomy. Carotid stenosis measurements (when applicable) are obtained utilizing NASCET criteria, using the distal internal carotid diameter as the denominator. CONTRAST:  50 mL Omnipaque 350 COMPARISON:  CT head without contrast 12/24/2014. FINDINGS: CT HEAD Brain: A 7 mm age indeterminate infarct is again seen just below the left lentiform nucleus. The basal ganglia are otherwise intact. No acute cortical infarct is present. Periventricular white matter changes are stable. Calvarium and skull base: Within normal limits Paranasal sinuses: Mild mucosal thickening and fluid is present in the maxillary sinuses bilaterally. The paranasal sinuses and the mastoid air cells are otherwise clear. Orbits: Bilateral lens replacements are present. The globes and orbits otherwise intact. CTA NECK Aortic arch: A 3 vessel arch configuration is present. Scattered calcifications are present. There is no significant focal stenosis or aneurysm. Right carotid system: The right common carotid artery is mildly tortuous. Calcifications are present at bifurcation without significant stenosis. There is tortuosity of the cervical right ICA without significant stenosis. Left carotid system: The left common carotid artery is mildly tortuous as well. Calcifications are present at the bifurcation without significant stenosis. There is tortuosity of the cervical left ICA without significant stenosis. Vertebral arteries:Vertebral arteries both originate from the subclavian arteries. There is moderate  tortuosity proximally on the left without significant stenosis. There is no significant stenosis or focal vascular injury within either vertebral artery in the neck. Skeleton: Mild endplate degenerative changes are present throughout the cervical spine. Osseous foraminal narrowing is present on the left at C3-4, C4-5, and C5-6. No focal lytic or blastic lesions are present. Other neck: There is mild heterogeneity of the thyroid without a discrete lesion. No significant adenopathy is present. No focal mucosal or submucosal lesions are evident. Salivary glands are within normal limits. The lung apices are clear. CTA HEAD Anterior circulation: Minimal atherosclerotic calcifications are present within the cavernous internal carotid arteries bilaterally without significant stenosis. The ICA termini are within normal limits. The right A1 segment is dominant. The anterior communicating artery is patent. There is a moderate stenosis in the proximal left A1 segment relative to the distal vessel. The more distal right M1 stenosis is present. The bifurcations are intact. There is asymmetric attenuation of MCA branch vessels on the right. Mild distal small vessel disease is present throughout. Posterior circulation: The PICA origins are visualized and normal. The vertebrobasilar junction is within normal limits. The basilar artery is normal. The right posterior cerebral artery originates from the basilar tip. The left posterior cerebral artery is of fetal type. There is mild distal small vessel irregularity of the posterior cerebral arteries without a significant proximal stenosis. Venous sinuses: The dural sinuses are patent. The left transverse sinus is dominant. The straight sinus deep cerebral veins are intact. Cortical veins are unremarkable. Anatomic variants: Fetal type left posterior cerebral artery. Delayed phase:  No pathologic enhancement is present. IMPRESSION: 1. Stable age indeterminate infarct just inferior to the  left lentiform nucleus. 2. Stable atrophy and white matter disease. 3. Moderate stenosis of the proximal left M1 segment with asymmetric attenuation of left MCA branch vessels compared to the right. 4. Mild to moderate distal right M1 segment stenosis. 5. Mild diffuse distal small vessel disease. Electronically Signed   By: San Morelle M.D.   On: 12/25/2014 16:47    Cardiac Studies: Echo 12/25/14 Study Conclusions  - Left ventricle: The cavity size was normal. Wall thickness was normal. Systolic function was normal. The estimated ejection fraction was in the range of 55% to 60%. Wall motion was normal; there were no regional wall motion abnormalities. There was no evidence of elevated ventricular filling pressure by Doppler parameters. - Mitral valve: Calcified annulus.  Impressions:  - No cardiac source of emboli was indentified.    Assessment/Plan:  81 y.o. female with hx of hypothyroidism, who was admitted to [facility] on 12/24/2014 for evaluation of exertional angina. Troponin negative. Cath on 10/20 revealed high grade LAD which was treated with an LAD DES. Post procedure she developed some speech difficulty and neurology was consulted. Work up suggested left subinsular cortex infarct embolic most likely a result status post PCI. She improved over the next 48 hrs.   Principal Problem:   Unstable angina (HCC) Active Problems:   CAD S/P percutaneous coronary angioplasty   Dyslipidemia   COPD (chronic obstructive pulmonary disease) (HCC)   Hypothyroidism   Cerebral embolism with cerebral infarction   PLAN: Neurology has suggested permissive hypertension over the next week or so. Continue ASA, Plavix, statin (new). She is on low dose Coreg, B/P was low yesterday PM once but 122/88 this am. Will review with Dr Acie Fredrickson- ? Hold off on beta blocker till seen in f/u. She should be OK for discharge today. 20 meq KCL this am x 1.   Kerin Ransom PA-C 12/26/2014, 7:34  AM [phone]  Attending Note:   The patient was seen and examined.  Agree with assessment and plan as noted above.  Changes made to the above note as needed.  She is doing well. Continue current meds including low dose coreg. I will see her in about 1 month  Will need fasting lipids in 3 months K was a little low - likely due to diuresis effect from IV contrast. Will encourage her to eat a diet that has plenty of potassium   Ready for DC   Thayer Headings, Brooke Bonito., MD, [facility] 12/26/2014, 10:13 AM [address],  [place] Pager [phone]

## 2014-12-26 NOTE — Progress Notes (Signed)
Pt discharged to home with all belongings. Discharge instructions given and educational material sent with pt.

## 2014-12-26 NOTE — Discharge Instructions (Signed)
Coronary Angiogram With Stent, Care After °Refer to this sheet in the next few weeks. These instructions provide you with information about caring for yourself after your procedure. Your health care provider may also give you more specific instructions. Your treatment has been planned according to current medical practices, but problems sometimes occur. Call your health care provider if you have any problems or questions after your procedure. °WHAT TO EXPECT AFTER THE PROCEDURE  °After your procedure, it is typical to have the following: °· Bruising at the catheter insertion site that usually fades within 1-2 weeks. °· Blood collecting in the tissue (hematoma) that may be painful to the touch. It should usually decrease in size and tenderness within 1-2 weeks. °HOME CARE INSTRUCTIONS °· Take medicines only as directed by your health care provider. Blood thinners may be prescribed after your procedure to improve blood flow through the stent. °· You may shower 24-48 hours after the procedure or as directed by your health care provider. Remove the bandage (dressing) and gently wash the catheter insertion site with plain soap and water. Pat the area dry with a clean towel. Do not rub the site, because this may cause bleeding. °· Do not take baths, swim, or use a hot tub until your health care provider approves. °· Check your catheter insertion site every day for redness, swelling, or drainage. °· Do not apply powder or lotion to the site. °· Do not lift over 10 lb (4.5 kg) for 5 days after your procedure or as directed by your health care provider. °· Ask your health care provider when it is okay to: °¨ Return to work or school. °¨ Resume usual physical activities or sports. °¨ Resume sexual activity. °· Eat a heart-healthy diet. This should include plenty of fresh fruits and vegetables. Meat should be lean cuts. Avoid the following types of food: °¨ Food that is high in salt. °¨ Canned or highly processed food. °¨ Food  that is high in saturated fat or sugar. °¨ Fried food. °· Make any other lifestyle changes as recommended by your health care provider. These may include: °¨ Not using any tobacco products, including cigarettes, chewing tobacco, or electronic cigarettes. If you need help quitting, ask your health care provider. °¨ Managing your weight. °¨ Getting regular exercise. °¨ Managing your blood pressure. °¨ Limiting your alcohol intake. °¨ Managing other health problems, such as diabetes. °· If you need an MRI after your heart stent has been placed, be sure to tell the health care provider who orders the MRI that you have a heart stent. °· Keep all follow-up visits as directed by your health care provider. This is important. °SEEK MEDICAL CARE IF: °· You have a fever. °· You have chills. °· You have increased bleeding from the catheter insertion site. Hold pressure on the site. °SEEK IMMEDIATE MEDICAL CARE IF: °· You develop chest pain or shortness of breath, feel faint, or pass out. °· You have unusual pain at the catheter insertion site. °· You have redness, warmth, or swelling at the catheter insertion site. °· You have drainage (other than a small amount of blood on the dressing) from the catheter insertion site. °· The catheter insertion site is bleeding, and the bleeding does not stop after 30 minutes of holding steady pressure on the site. °· You develop bleeding from any other place, such as from your rectum. There may be bright red blood in your urine or stool, or it may appear as black, tarry stool. °  °  This information is not intended to replace advice given to you by your health care provider. Make sure you discuss any questions you have with your health care provider. °  °Document Released: 09/09/2004 Document Revised: 03/13/2014 Document Reviewed: 07/15/2012 °Elsevier Interactive Patient Education ©2016 [employer] ° °

## 2014-12-26 NOTE — Evaluation (Signed)
Speech Language Pathology Evaluation Patient Details Name: Rachel Park MRN: [id number] DOB: 1931/08/11 Today's Date: 12/26/2014 Time:  -     Problem List:  Patient Active Problem List   Diagnosis Date Noted  . CAD S/P percutaneous coronary angioplasty 12/26/2014  . Dyslipidemia 12/26/2014  . COPD (chronic obstructive pulmonary disease) ([place]) 12/26/2014  . Hypothyroidism 12/26/2014  . Cerebral embolism with cerebral infarction 12/25/2014  . Unstable angina ([place]) 12/24/2014   Past Medical History:  Past Medical History  Diagnosis Date  . Complication of anesthesia   . PONV (postoperative nausea and vomiting)   . Hypothyroidism   . Vertigo, benign positional     to be evaluated, intermittent   Past Surgical History:  Past Surgical History  Procedure Laterality Date  . Abdominal hysterectomy    . Tonsillectomy    . Cataract extraction Bilateral   . Colonoscopy with propofol N/A 12/01/2013    Procedure: COLONOSCOPY WITH PROPOFOL;  Surgeon: Garlan Fair, MD;  Location: [facility];  Service: Endoscopy;  Laterality: N/A;  . Cardiac catheterization N/A 12/24/2014    Procedure: Left Heart Cath and Coronary Angiography;  Surgeon: Sherren Mocha, MD;  Location: [facility];  Service: Cardiovascular;  Laterality: N/A;   HPI:  Rachel Park is a 81 y.o. female who underwent cardiac catheterization today for symptomatic carotid disease. She had a percutaneous intervention with drug-eluting stent. She states that following this, she immediately noticed that she had some difficulty with answering questions whether the symptoms were very mild. Nursing then noticed that she had a significant worsening around 10 PM. Code stroke was therefore called. Small lacunar infarct in left basal ganglia.   Assessment / Plan / Recommendation Clinical Impression  Pt demosntrates normal cognitive lingusitic function. She reports having one further episode of anomia yesterday when trying to say "[place]", but otherwise has not struggled to find words. She was observed to be WNL today with naming, in conversation and in writing/reading tasks. No SLP f/u recommended at this time, but discussed f/u with pt if further concerns arise.     SLP Assessment  Patient does not need any further Speech Lanaguage Pathology Services             SLP Goals     SLP Evaluation Prior Functioning  Cognitive/Linguistic Baseline: Within functional limits   Cognition  Overall Cognitive Status: Within Functional Limits for tasks assessed Orientation Level: Oriented X4    Comprehension  Auditory Comprehension Overall Auditory Comprehension: Appears within functional limits for tasks assessed Reading Comprehension Reading Status: Within funtional limits    Expression Expression Primary Mode of Expression: Verbal Verbal Expression Overall Verbal Expression: Appears within functional limits for tasks assessed Written Expression Dominant Hand: Right   Oral / Motor Oral Motor/Sensory Function Overall Oral Motor/Sensory Function: Appears within functional limits for tasks assessed Motor Speech Overall Motor Speech: Appears within functional limits for tasks assessed   GO     Rachel Park, Rachel Park 12/26/2014, 9:21 AM

## 2014-12-28 ENCOUNTER — Telehealth: Payer: Self-pay | Admitting: Cardiovascular Disease

## 2014-12-28 NOTE — Telephone Encounter (Signed)
Patient contacted regarding discharge from [facility] on 10/22  Patient understands to follow up with provider ? 11/1 @3pm  with Dr. Acie Fredrickson, at [address] office, pt asked to arrive 15 minutes early  Patient understands discharge instructions? Yes  Patient understands medications and regiment? Yes  Patient understands to bring all medications to this visit? Yes

## 2014-12-28 NOTE — Telephone Encounter (Signed)
1-2 wk TOC Per [facility] 01/05/15 @ 3pm w/ Dr Liana Crocker

## 2014-12-29 NOTE — Discharge Summary (Signed)
Patient ID: Rachel Park,  MRN: [id number], DOB/AGE: [birth year] 81 y.o.  Admit date: 12/24/2014 Discharge date: 12/29/2014  Primary Care Provider: No primary care provider on file. Primary Cardiologist: Dr Acie Fredrickson  Discharge Diagnoses Principal Problem:   Unstable angina [facility]) Active Problems:   CAD S/P percutaneous coronary angioplasty   Dyslipidemia   COPD (chronic obstructive pulmonary disease) ([facility])   Hypothyroidism   Cerebral embolism with cerebral infarction    Procedures:  Coronary angiogram and LAD DES placement 12/24/14                         Head CT 12/24/14                         CT angiogram of head and neck 12/25/14  Hospital Course:  81 y.o. female with hx of hypothyroidism, who was admitted to [facility] on 12/24/2014 for evaluation of exertional angina. Troponin's were negative. Cath on 10/20 revealed high grade LAD which was treated with an LAD DES. Post procedure she developed some speech difficulty and neurology was consulted. Work up suggested left subinsular cortex infarct embolic most likely a result status post PCI. She improved over the next 48 hrs. She was seen on the morning of 12/26/14 by Dr Acie Fredrickson and felt to be stable for discharge.  Neurology has suggested permissive hypertension over the next week or so. Continue ASA, Plavix, statin (new). She is on low dose Coreg.  She will need f/u fasting lipids and LFTS in 3 mos.   Discharge Vitals:  Blood pressure 122/89, pulse 70, temperature 98.5 F (36.9 C), temperature source Oral, resp. rate 20, height 5' 1.5" (1.562 m), weight 124 lb 12.5 oz (56.6 kg), SpO2 99 %.    Labs: No results found for this or any previous visit (from the past 24 hour(s)).  Disposition:  Follow-up Information    Follow up with Nahser, Wonda Cheng, MD.   Specialty:  Cardiology   Why:  office will contact you   Contact information:   [address] [place] [address] [phone]       Discharge Medications:      Medication List    STOP taking these medications        Biotin 5 MG Caps     cholecalciferol 1000 UNITS tablet  Commonly known as:  VITAMIN D      TAKE these medications        acetaminophen 325 MG tablet  Commonly known as:  TYLENOL  Take 2 tablets (650 mg total) by mouth every 6 (six) hours as needed for mild pain.     aspirin 81 MG EC tablet  Take 1 tablet (81 mg total) by mouth daily.     atorvastatin 40 MG tablet  Commonly known as:  LIPITOR  Take 1 tablet (40 mg total) by mouth daily at 6 PM.     CALCIUM 600 + D PO  Take 1 tablet by mouth daily.     carvedilol 3.125 MG tablet  Commonly known as:  COREG  Take 1 tablet (3.125 mg total) by mouth 2 (two) times daily with a meal.     clopidogrel 75 MG tablet  Commonly known as:  PLAVIX  Take 1 tablet (75 mg total) by mouth daily with breakfast.     levothyroxine 75 MCG tablet  Commonly known as:  SYNTHROID, LEVOTHROID  Take 75 mcg by mouth daily  before breakfast.     multivitamin with minerals Tabs tablet  Take 1 tablet by mouth daily.     NITROSTAT 0.4 MG SL tablet  Generic drug:  nitroGLYCERIN  Place 1 tablet (0.4 mg total) under the tongue every 5 (five) minutes as needed. Chest pain         Duration of Discharge Encounter: Greater than 30 minutes including physician time.  Angelena Form PA-C 12/29/2014 8:34 AM   Attending Note:   The patient was seen and examined.  Agree with assessment and plan as noted above.  Changes made to the above note as needed.  Pt is ready for DC.  See not from day of discharge.    Thayer Headings, Brooke Bonito., MD, [facility] 12/29/2014, 3:00 PM [address]  [place] Pager [phone]

## 2014-12-31 ENCOUNTER — Ambulatory Visit: Payer: Medicare FFS | Admitting: Cardiology

## 2015-01-05 ENCOUNTER — Encounter: Payer: Self-pay | Admitting: Cardiovascular Disease

## 2015-01-05 ENCOUNTER — Ambulatory Visit (INDEPENDENT_AMBULATORY_CARE_PROVIDER_SITE_OTHER): Payer: Medicare Other | Admitting: Cardiovascular Disease

## 2015-01-05 VITALS — BP 160/90 | HR 76 | Ht 61.5 in | Wt 120.8 lb

## 2015-01-05 DIAGNOSIS — I2510 Atherosclerotic heart disease of native coronary artery without angina pectoris: Secondary | ICD-10-CM

## 2015-01-05 DIAGNOSIS — I63442 Cerebral infarction due to embolism of left cerebellar artery: Secondary | ICD-10-CM | POA: Diagnosis not present

## 2015-01-05 DIAGNOSIS — Z9861 Coronary angioplasty status: Secondary | ICD-10-CM

## 2015-01-05 MED ORDER — CARVEDILOL 6.25 MG PO TABS: 6.2500 mg | ORAL_TABLET | Freq: Two times a day (BID) | ORAL | Status: DC

## 2015-01-05 NOTE — Patient Instructions (Addendum)
Medication Instructions:  INCREASE Carvedilol (Coreg) to 6.25 mg twice daily   Labwork: Your physician recommends that you return for lab work (cholesterol, liver, basic metabolic panel) in: 3 months on the day of or a few days before your office visit with Dr. Acie Fredrickson.  You will need to FAST for this appointment - nothing to eat or drink after midnight the night before except water.   Testing/Procedures: Your physician has requested that you have a MRI of your brain in 5 weeks    Follow-Up: Your physician recommends that you schedule a follow-up appointment in: 3 months with Dr. Acie Fredrickson   If you need a refill on your cardiac medications before your next appointment, please call your pharmacy.   Thank you for choosing [facility]! Christen Bame, RN [phone]

## 2015-01-05 NOTE — Progress Notes (Signed)
Cardiology Office Note   Date:  01/05/2015   ID:  Rachel Park, DOB 16-Sep-1931, MRN [id number]  PCP:  Mathews Argyle, MD  Cardiologist:   Thayer Headings, MD   Chief Complaint  Patient presents with  . Coronary Artery Disease    problem list 1. Coronary artery  disease-status post stenting of her proximal LAD 2.  CVA- occurred during the stenting   History of Present Illness: Rachel Park is a 81 y.o. female who presents for follow up of her stenting She has done well.  Is a bit anxious - may account for her elevated BP today .  Memory has improved ,  No residual CVA symptoms.     Past Medical History  Diagnosis Date  . Complication of anesthesia   . PONV (postoperative nausea and vomiting)   . Hypothyroidism   . Vertigo, benign positional     to be evaluated, intermittent    Past Surgical History  Procedure Laterality Date  . Abdominal hysterectomy    . Tonsillectomy    . Cataract extraction Bilateral   . Colonoscopy with propofol N/A 12/01/2013    Procedure: COLONOSCOPY WITH PROPOFOL;  Surgeon: Garlan Fair, MD;  Location: [facility];  Service: Endoscopy;  Laterality: N/A;  . Cardiac catheterization N/A 12/24/2014    Procedure: Left Heart Cath and Coronary Angiography;  Surgeon: Sherren Mocha, MD;  Location: [facility];  Service: Cardiovascular;  Laterality: N/A;     Current Outpatient Prescriptions  Medication Sig Dispense Refill  . acetaminophen (TYLENOL) 325 MG tablet Take 2 tablets (650 mg total) by mouth every 6 (six) hours as needed for mild pain.    Marland Kitchen aspirin EC 81 MG EC tablet Take 1 tablet (81 mg total) by mouth daily.    Marland Kitchen atorvastatin (LIPITOR) 40 MG tablet Take 1 tablet (40 mg total) by mouth daily at 6 PM. 30 tablet 11  . Biotin (BIOTIN MAXIMUM STRENGTH) 10 MG TABS Take 1 tablet by mouth daily.    . Calcium Carb-Cholecalciferol (CALCIUM 600 + D PO) Take 1 tablet by mouth daily.    . carvedilol (COREG) 3.125 MG tablet Take 1  tablet (3.125 mg total) by mouth 2 (two) times daily with a meal. 60 tablet 11  . cholecalciferol (VITAMIN D) 1000 UNITS tablet Take 1,000 Units by mouth daily.    . clopidogrel (PLAVIX) 75 MG tablet Take 1 tablet (75 mg total) by mouth daily with breakfast. 30 tablet 11  . levothyroxine (SYNTHROID, LEVOTHROID) 75 MCG tablet Take 75 mcg by mouth daily before breakfast.    . NITROSTAT 0.4 MG SL tablet Place 1 tablet (0.4 mg total) under the tongue every 5 (five) minutes as needed. Chest pain 25 tablet 2   No current facility-administered medications for this visit.    Allergies:   Review of patient's allergies indicates no known allergies.    Social History:  The patient  reports that she has never smoked. She does not have any smokeless tobacco history on file. She reports that she drinks alcohol. She reports that she does not use illicit drugs.   Family History:  The patient's family history is not on file.    ROS:  Please see the history of present illness.    Review of Systems: Constitutional:  denies fever, chills, diaphoresis, appetite change and fatigue.  HEENT: denies photophobia, eye pain, redness, hearing loss, ear pain, congestion, sore throat, rhinorrhea, sneezing, neck pain, neck stiffness and tinnitus.  Respiratory: denies SOB,  DOE, cough, chest tightness, and wheezing.  Cardiovascular: denies chest pain, palpitations and leg swelling.  Gastrointestinal: denies nausea, vomiting, abdominal pain, diarrhea, constipation, blood in stool.  Genitourinary: denies dysuria, urgency, frequency, hematuria, flank pain and difficulty urinating.  Musculoskeletal: denies  myalgias, back pain, joint swelling, arthralgias and gait problem.   Skin: denies pallor, rash and wound.  Neurological: denies dizziness, seizures, syncope, weakness, light-headedness, numbness and headaches.   Hematological: denies adenopathy, easy bruising, personal or family bleeding history.   Psychiatric/ Behavioral: denies suicidal ideation, mood changes, confusion, nervousness, sleep disturbance and agitation.       All other systems are reviewed and negative.    PHYSICAL EXAM: VS:  BP 160/90 mmHg  Pulse 76  Ht 5' 1.5" (1.562 m)  Wt 120 lb 12.8 oz (54.795 kg)  BMI 22.46 kg/m2  SpO2 98% , BMI Body mass index is 22.46 kg/(m^2). GEN: Well nourished, well developed, in no acute distress HEENT: normal Neck: no JVD, carotid bruits, or masses Cardiac: RRR; no murmurs, rubs, or gallops,no edema  Respiratory:  clear to auscultation bilaterally, normal work of breathing GI: soft, nontender, nondistended, + BS MS: no deformity or atrophy Skin: warm and dry, no rash Neuro:  Strength and sensation are intact Psych: normal   EKG:  EKG is not ordered today.   Recent Labs: 12/24/2014: ALT 24; B Natriuretic Peptide 100.3*; TSH 2.481 12/25/2014: BUN 8; Creatinine, Ser 0.55; Hemoglobin 13.1; Platelets 245; Potassium 3.6; Sodium 138    Lipid Panel    Component Value Date/Time   CHOL 222* 12/25/2014 0308   TRIG 51 12/25/2014 0308   HDL 85 12/25/2014 0308   CHOLHDL 2.6 12/25/2014 0308   VLDL 10 12/25/2014 0308   LDLCALC 127* 12/25/2014 0308      Wt Readings from Last 3 Encounters:  01/05/15 120 lb 12.8 oz (54.795 kg)  12/24/14 124 lb 12.5 oz (56.6 kg)  12/01/13 130 lb (58.968 kg)      Other studies Reviewed: Additional studies/ records that were reviewed today include: . Review of the above records demonstrates:    ASSESSMENT AND PLAN:  1.  CAD - doing well after PCI of her LAD . No angina .  2. CVA -  We will get an MRI of the brain in approximately 5 weeks. This was the recommendation from neuro. Her stroke symptoms have completely cleared by my exam.   3. Hyper lipidemia: Her lipids were mildly elevated. She has been started on atorvastatin. We'll check fasting labs in approximately 3 months. I'll see her again in 3 months.  Current medicines are  reviewed at length with the patient today.  The patient does not have concerns regarding medicines.  The following changes have been made:  no change  Labs/ tests ordered today include:  No orders of the defined types were placed in this encounter.     Disposition:   FU with me in 3 months      Nahser, Wonda Cheng, MD  01/05/2015 3:52 PM    [facility] [address] [place], [place], [place]  [address] Phone: [phone]; Fax: [phone]   [facility]  [address] [place], [place]  [address] [phone]   Fax [phone]

## 2015-02-05 ENCOUNTER — Telehealth: Payer: Self-pay | Admitting: Cardiovascular Disease

## 2015-02-05 NOTE — Telephone Encounter (Signed)
New Message  Pt had appt w/ PCP today and wondered if the labs taken today would satisfy lab work for next week. Pt stated she did not need immediate response. Please call back and discuss.

## 2015-02-08 NOTE — Telephone Encounter (Signed)
Left message for patient to call office regarding lab work.  She is scheduled for an MRI this week and has a bun and creatinine ordered for 12/8 in preparation for that test.  I advised her to call me back to discuss.

## 2015-02-08 NOTE — Telephone Encounter (Signed)
Spoke with patient who states she had lab work at Dr. Carlyle Lipa office on 12/2 and he advised that she might be able to use those results for bun and creatinine ordered for MRI Friday.  I called MRI department at [address] [facility] and was advised that labs would be acceptable.  I called Dr. Carlyle Lipa office to request copies faxed to our office.

## 2015-02-11 ENCOUNTER — Other Ambulatory Visit: Payer: Medicare Other

## 2015-02-11 NOTE — Telephone Encounter (Signed)
Labs received and placed in [facility] for scanning into epic

## 2015-02-12 ENCOUNTER — Telehealth: Payer: Self-pay | Admitting: Neurology

## 2015-02-12 ENCOUNTER — Ambulatory Visit (HOSPITAL_COMMUNITY)
Admission: RE | Admit: 2015-02-12 | Discharge: 2015-02-12 | Disposition: A | Discharge status: Home / Self Care | Payer: Medicare Other | Source: Ambulatory Visit | Attending: Cardiovascular Disease | Admitting: Cardiovascular Disease

## 2015-02-12 DIAGNOSIS — I63442 Cerebral infarction due to embolism of left cerebellar artery: Secondary | ICD-10-CM | POA: Diagnosis present

## 2015-02-12 DIAGNOSIS — I739 Peripheral vascular disease, unspecified: Secondary | ICD-10-CM | POA: Insufficient documentation

## 2015-02-12 MED ORDER — GADOBENATE DIMEGLUMINE 529 MG/ML IV SOLN
10.0000 mL | Freq: Once | INTRAVENOUS | Status: AC | PRN
Start: 2015-02-12 — End: 2015-02-12
  Administered 2015-02-12: 10 mL via INTRAVENOUS

## 2015-02-12 NOTE — Telephone Encounter (Signed)
I called the patient. The "stroke event" of the left brain by CT was a Virchow-Robbin space, not a stroke.MRI of the brain shows a subacute subcortical stroke in the right brain, which is the source of her symptoms in October.  She is on aspirin and plavix and she is to remain on these medications.

## 2015-02-15 NOTE — Telephone Encounter (Signed)
Patient returned your call of 12/12.  Please call. Thanks!

## 2015-02-15 NOTE — Telephone Encounter (Signed)
I called the patient and left a voicemail asking her to call me back.  

## 2015-02-16 NOTE — Telephone Encounter (Signed)
I called the patient. Appointment scheduled 03/15/15.  

## 2015-02-16 NOTE — Telephone Encounter (Addendum)
Pt called and says she spoke with Dr. Jannifer Franklin last night and was told to make an appt. She needs to know if he still needs her to come in. Please call and advise [phone]

## 2015-03-10 DIAGNOSIS — R69 Illness, unspecified: Secondary | ICD-10-CM | POA: Diagnosis not present

## 2015-03-15 ENCOUNTER — Ambulatory Visit: Payer: Self-pay | Admitting: Neurology

## 2015-03-24 ENCOUNTER — Ambulatory Visit (INDEPENDENT_AMBULATORY_CARE_PROVIDER_SITE_OTHER): Payer: Medicare HMO | Admitting: Neurology

## 2015-03-24 ENCOUNTER — Encounter: Payer: Self-pay | Admitting: Neurology

## 2015-03-24 VITALS — BP 147/89 | HR 68 | Ht 62.5 in | Wt 128.5 lb

## 2015-03-24 DIAGNOSIS — G451 Carotid artery syndrome (hemispheric): Secondary | ICD-10-CM | POA: Diagnosis not present

## 2015-03-24 DIAGNOSIS — G459 Transient cerebral ischemic attack, unspecified: Secondary | ICD-10-CM | POA: Insufficient documentation

## 2015-03-24 HISTORY — DX: Transient cerebral ischemic attack, unspecified: G45.9

## 2015-03-24 NOTE — Patient Instructions (Signed)
Stroke Prevention Some medical conditions and behaviors are associated with an increased chance of having a stroke. You may prevent a stroke by making healthy choices and managing medical conditions. HOW CAN I REDUCE MY RISK OF HAVING A STROKE?   Stay physically active. Get at least 30 minutes of activity on most or all days.  Do not smoke. It may also be helpful to avoid exposure to secondhand smoke.  Limit alcohol use. Moderate alcohol use is considered to be:  No more than 2 drinks per day for men.  No more than 1 drink per day for nonpregnant women.  Eat healthy foods. This involves:  Eating 5 or more servings of fruits and vegetables a day.  Making dietary changes that address high blood pressure (hypertension), high cholesterol, diabetes, or obesity.  Manage your cholesterol levels.  Making food choices that are high in fiber and low in saturated fat, trans fat, and cholesterol may control cholesterol levels.  Take any prescribed medicines to control cholesterol as directed by your health care provider.  Manage your diabetes.  Controlling your carbohydrate and sugar intake is recommended to manage diabetes.  Take any prescribed medicines to control diabetes as directed by your health care provider.  Control your hypertension.  Making food choices that are low in salt (sodium), saturated fat, trans fat, and cholesterol is recommended to manage hypertension.  Ask your health care provider if you need treatment to lower your blood pressure. Take any prescribed medicines to control hypertension as directed by your health care provider.  If you are 18-39 years of age, have your blood pressure checked every 3-5 years. If you are 40 years of age or older, have your blood pressure checked every year.  Maintain a healthy weight.  Reducing calorie intake and making food choices that are low in sodium, saturated fat, trans fat, and cholesterol are recommended to manage  weight.  Stop drug abuse.  Avoid taking birth control pills.  Talk to your health care provider about the risks of taking birth control pills if you are over 35 years old, smoke, get migraines, or have ever had a blood clot.  Get evaluated for sleep disorders (sleep apnea).  Talk to your health care provider about getting a sleep evaluation if you snore a lot or have excessive sleepiness.  Take medicines only as directed by your health care provider.  For some people, aspirin or blood thinners (anticoagulants) are helpful in reducing the risk of forming abnormal blood clots that can lead to stroke. If you have the irregular heart rhythm of atrial fibrillation, you should be on a blood thinner unless there is a good reason you cannot take them.  Understand all your medicine instructions.  Make sure that other conditions (such as anemia or atherosclerosis) are addressed. SEEK IMMEDIATE MEDICAL CARE IF:   You have sudden weakness or numbness of the face, arm, or leg, especially on one side of the body.  Your face or eyelid droops to one side.  You have sudden confusion.  You have trouble speaking (aphasia) or understanding.  You have sudden trouble seeing in one or both eyes.  You have sudden trouble walking.  You have dizziness.  You have a loss of balance or coordination.  You have a sudden, severe headache with no known cause.  You have new chest pain or an irregular heartbeat. Any of these symptoms may represent a serious problem that is an emergency. Do not wait to see if the symptoms will   go away. Get medical help at once. Call your local emergency services (911 in [place] Do not drive yourself to the hospital.   This information is not intended to replace advice given to you by your health care provider. Make sure you discuss any questions you have with your health care provider.   Document Released: 03/30/2004 Document Revised: 03/13/2014 Document Reviewed:  08/23/2012 Elsevier Interactive Patient Education 2016 [employer]  

## 2015-03-24 NOTE — Progress Notes (Signed)
Reason for visit: TIA  Referring physician: [facility]  Rachel Park is a 82 y.o. female  History of present illness:  Rachel Park is an 82 year old right-handed white female with a history of a coronary artery stent placement that occurred on 12/24/2014. Shortly after the procedure, the patient was noted to be somewhat confused, seemed to have difficulty understanding speech. She did not develop any numbness or weakness of the extremities, she does recall having a severe headache around that time. Her symptoms persisted for least several hours, when she woke up the next morning, she had cleared all of her symptoms, and she remains symptom-free. She was unable to obtain a MRI evaluation initially, CT did not show a definite stroke. She had a full stroke workup with a CT of the head, CT angiogram of the head and neck, and a 2-D echocardiogram. No source of the stroke was identified. It was felt that the patient may have had a TIA associated with the procedure itself. The patient is on aspirin and Plavix following deployment of the stent. She has done quite well and staying out of the hospital, she is back to her full activity level, she is operating motor vehicle. She denies any vision changes, speech changes, numbness or weakness, balance issues, or confusion. She denies any further headaches. She returns for an evaluation.  Past Medical History  Diagnosis Date  . Complication of anesthesia   . PONV (postoperative nausea and vomiting)   . Hypothyroidism   . Vertigo, benign positional     to be evaluated, intermittent  . TIA (transient ischemic attack) 03/24/2015    Past Surgical History  Procedure Laterality Date  . Abdominal hysterectomy    . Tonsillectomy    . Cataract extraction Bilateral   . Colonoscopy with propofol N/A 12/01/2013    Procedure: COLONOSCOPY WITH PROPOFOL;  Surgeon: Garlan Fair, MD;  Location: [facility];  Service: Endoscopy;  Laterality: N/A;  . Cardiac  catheterization N/A 12/24/2014    Procedure: Left Heart Cath and Coronary Angiography;  Surgeon: Sherren Mocha, MD;  Location: [facility];  Service: Cardiovascular;  Laterality: N/A;    Family History  Problem Relation Age of Onset  . Heart attack Father   . Stroke Mother   . Cancer Sister     breast    Social history:  reports that she has never smoked. She has never used smokeless tobacco. She reports that she does not drink alcohol or use illicit drugs.  Medications:  Prior to Admission medications   Medication Sig Start Date End Date Taking? Authorizing Provider  acetaminophen (TYLENOL) 325 MG tablet Take 2 tablets (650 mg total) by mouth every 6 (six) hours as needed for mild pain. 12/26/14  Yes Erlene Quan, PA-C  aspirin EC 81 MG EC tablet Take 1 tablet (81 mg total) by mouth daily. 12/26/14  Yes Luke K Kilroy, PA-C  atorvastatin (LIPITOR) 40 MG tablet Take 1 tablet (40 mg total) by mouth daily at 6 PM. 12/26/14  Yes Erlene Quan, PA-C  Biotin (BIOTIN MAXIMUM STRENGTH) 10 MG TABS Take 1 tablet by mouth daily.   Yes Historical Provider, MD  Calcium Carb-Cholecalciferol (CALCIUM 600 + D PO) Take 1 tablet by mouth daily.   Yes Historical Provider, MD  carvedilol (COREG) 12.5 MG tablet Take 12.5 mg by mouth 2 (two) times daily with a meal.   Yes Historical Provider, MD  cholecalciferol (VITAMIN D) 1000 UNITS tablet Take 1,000 Units by  mouth daily.   Yes Historical Provider, MD  clopidogrel (PLAVIX) 75 MG tablet Take 1 tablet (75 mg total) by mouth daily with breakfast. 12/26/14  Yes Erlene Quan, PA-C  levothyroxine (SYNTHROID, LEVOTHROID) 75 MCG tablet Take 75 mcg by mouth daily before breakfast.   Yes Historical Provider, MD  NITROSTAT 0.4 MG SL tablet Place 1 tablet (0.4 mg total) under the tongue every 5 (five) minutes as needed. Chest pain 12/26/14  Yes Erlene Quan, PA-C     No Known Allergies  ROS:  Out of a complete 14 system review of symptoms, the patient  complains only of the following symptoms, and all other reviewed systems are negative.  Fatigue Easy bruising Incontinence of the bladder Anxiety, decreased energy Achy muscles  Blood pressure 147/89, pulse 68, height 5' 2.5" (1.588 m), weight 128 lb 8 oz (58.287 kg).  Physical Exam  General: The patient is alert and cooperative at the time of the examination.  Eyes: Pupils are equal, round, and reactive to light. Discs are flat bilaterally.  Neck: The neck is supple, no carotid bruits are noted.  Respiratory: The respiratory examination is clear.  Cardiovascular: The cardiovascular examination reveals a regular rate and rhythm, no obvious murmurs or rubs are noted.  Skin: Extremities are without significant edema.  Neurologic Exam  Mental status: The patient is alert and oriented x 3 at the time of the examination. The patient has apparent normal recent and remote memory, with an apparently normal attention span and concentration ability.  Cranial nerves: Facial symmetry is present. There is good sensation of the face to pinprick and soft touch bilaterally. The strength of the facial muscles and the muscles to head turning and shoulder shrug are normal bilaterally. Speech is well enunciated, no aphasia or dysarthria is noted. Extraocular movements are full. Visual fields are full. The tongue is midline, and the patient has symmetric elevation of the soft palate. No obvious hearing deficits are noted.  Motor: The motor testing reveals 5 over 5 strength of all 4 extremities. Good symmetric motor tone is noted throughout.  Sensory: Sensory testing is intact to pinprick, soft touch, vibration sensation, and position sense on all 4 extremities. No evidence of extinction is noted.  Coordination: Cerebellar testing reveals good finger-nose-finger and heel-to-shin bilaterally.  Gait and station: Gait is normal. Tandem gait is slightly unsteady. Romberg is negative. No drift is  seen.  Reflexes: Deep tendon reflexes are symmetric and normal bilaterally. Toes are downgoing bilaterally.    Ct Head Wo Contrast 12/24/2014 1. Atrophy and chronic small vessel ischemia. Small lacunar infarct in left basal ganglia. 2. No CT evidence of acute ischemia.   CTA Head and neck 12/25/14 IMPRESSION: 1. Stable age indeterminate infarct just inferior to the left lentiform nucleus. 2. Stable atrophy and white matter disease. 3. Moderate stenosis of the proximal left M1 segment with asymmetric attenuation of left MCA branch vessels compared to the right. 4. Mild to moderate distal right M1 segment stenosis. 5. Mild diffuse distal small vessel disease.  2D echo: Study Conclusions  - Left ventricle: The cavity size was normal. Wall thickness was normal. Systolic function was normal. The estimated ejection fraction was in the range of 55% to 60%. Wall motion was normal; there were no regional wall motion abnormalities. There was no evidence of elevated ventricular filling pressure by Doppler parameters. - Mitral valve: Calcified annulus.  Impressions:  - No cardiac source of emboli was identified.   MRI brain 02/12/15:  IMPRESSION:  1. Small late subacute to early chronic appearing white matter lacunar infarct in the right MCA territory (centrum semiovale). No hemorrhage or mass effect. 2. No acute intracranial abnormality. Mild to moderate for age scattered white matter signal changes elsewhere, most commonly due to chronic small vessel disease. 3. The small hypodense area seen in October by CT at the left lentiform nucleus is a perivascular space (normal anatomic variant). No previous cortically based infarct identified.  * MRI scan images were reviewed online. I agree with the written report.   Assessment/Plan:  1. Right parietal stroke event  MRI of the brain has been done that shows a small right parietal stroke near the cortical surface. The  patient likely had an embolus associated with the catheterization procedure. The patient has done quite well, clinically this was a TIA event. The patient is following up with cardiology and her primary care physician. She will return to this office on an as-needed basis.  Jill Alexanders MD 03/24/2015 7:21 PM  [facility] [address] [place], [place] [address]  Phone [phone] Fax [phone]

## 2015-03-25 DIAGNOSIS — Z79899 Other long term (current) drug therapy: Secondary | ICD-10-CM | POA: Diagnosis not present

## 2015-03-29 DIAGNOSIS — Z803 Family history of malignant neoplasm of breast: Secondary | ICD-10-CM | POA: Diagnosis not present

## 2015-03-29 DIAGNOSIS — Z1231 Encounter for screening mammogram for malignant neoplasm of breast: Secondary | ICD-10-CM | POA: Diagnosis not present

## 2015-03-31 ENCOUNTER — Telehealth: Payer: Self-pay | Admitting: Nurse Practitioner

## 2015-03-31 ENCOUNTER — Other Ambulatory Visit (INDEPENDENT_AMBULATORY_CARE_PROVIDER_SITE_OTHER): Payer: Medicare HMO | Admitting: *Deleted

## 2015-03-31 DIAGNOSIS — I10 Essential (primary) hypertension: Secondary | ICD-10-CM | POA: Diagnosis not present

## 2015-03-31 DIAGNOSIS — E785 Hyperlipidemia, unspecified: Secondary | ICD-10-CM

## 2015-03-31 LAB — LIPID PANEL
Cholesterol: 142 mg/dL (ref 125–200)
HDL: 70 mg/dL (ref >=46)
LDL CALC: 59 mg/dL (ref <130)
TRIGLYCERIDES: 67 mg/dL (ref <150)
Total CHOL/HDL Ratio: 2 Ratio (ref <=5.0)
VLDL: 13 mg/dL (ref <30)

## 2015-03-31 LAB — BASIC METABOLIC PANEL
BUN: 12 mg/dL (ref 7–25)
CHLORIDE: 102 mmol/L (ref 98–110)
CO2: 26 mmol/L (ref 20–31)
CREATININE: 0.47 mg/dL — AB (ref 0.60–0.88)
Calcium: 8.7 mg/dL (ref 8.6–10.4)
GLUCOSE: 90 mg/dL (ref 65–99)
POTASSIUM: 4.4 mmol/L (ref 3.5–5.3)
Sodium: 135 mmol/L (ref 135–146)

## 2015-03-31 LAB — HEPATIC FUNCTION PANEL
ALK PHOS: 94 U/L (ref 33–130)
ALT: 47 U/L — ABNORMAL HIGH (ref 6–29)
AST: 45 U/L — ABNORMAL HIGH (ref 10–35)
Albumin: 3.9 g/dL (ref 3.6–5.1)
BILIRUBIN DIRECT: 0.2 mg/dL (ref <=0.2)
BILIRUBIN INDIRECT: 0.5 mg/dL (ref 0.2–1.2)
BILIRUBIN TOTAL: 0.7 mg/dL (ref 0.2–1.2)
TOTAL PROTEIN: 6.4 g/dL (ref 6.1–8.1)

## 2015-03-31 MED ORDER — ATORVASTATIN CALCIUM 20 MG PO TABS: 20.0000 mg | ORAL_TABLET | Freq: Every day | ORAL | Status: DC

## 2015-03-31 NOTE — Telephone Encounter (Signed)
Reviewed results and plan of care with patient who verbalized understanding and agreement to reduce Atorvastatin to 20 mg once daily.  She is scheduled for office visit with Dr. Acie Fredrickson on 2/1.  I have scheduled her repeat lab work for April 27.  She thanked me for the call.

## 2015-03-31 NOTE — Telephone Encounter (Signed)
-----   Message from Thayer Headings, MD sent at 03/31/2015  4:39 PM EST ----- BMP is good. Lipids are great.  AST and ALT are mildly elevated  They are not high enough to stop the Atorvastatin but I think we should cut the dose in 1/2. Please reduce her Atorvastatin to 20 and recheck labs in 3 months .

## 2015-03-31 NOTE — Addendum Note (Signed)
Addended by: Eulis Foster on: 03/31/2015 07:35 AM   Modules accepted: Orders

## 2015-04-07 ENCOUNTER — Encounter: Payer: Self-pay | Admitting: Cardiovascular Disease

## 2015-04-07 ENCOUNTER — Ambulatory Visit (INDEPENDENT_AMBULATORY_CARE_PROVIDER_SITE_OTHER): Payer: Medicare HMO | Admitting: Cardiovascular Disease

## 2015-04-07 VITALS — BP 120/70 | HR 60 | Ht 62.5 in | Wt 126.8 lb

## 2015-04-07 DIAGNOSIS — Z9861 Coronary angioplasty status: Secondary | ICD-10-CM

## 2015-04-07 DIAGNOSIS — I63139 Cerebral infarction due to embolism of unspecified carotid artery: Secondary | ICD-10-CM

## 2015-04-07 DIAGNOSIS — I2510 Atherosclerotic heart disease of native coronary artery without angina pectoris: Secondary | ICD-10-CM

## 2015-04-07 NOTE — Patient Instructions (Signed)
Medication Instructions:  Your physician recommends that you continue on your current medications as directed. Please refer to the Current Medication list given to you today.   Labwork: Keep your lab appointment for April 27 for fasting lab work   Testing/Procedures: None Ordered   Follow-Up: Your physician wants you to follow-up in: 6 months with Dr. Acie Fredrickson.  You will receive a reminder letter in the mail two months in advance. If you don't receive a letter, please call our office to schedule the follow-up appointment.   If you need a refill on your cardiac medications before your next appointment, please call your pharmacy.   Thank you for choosing [facility]! Christen Bame, RN [phone]

## 2015-04-07 NOTE — Progress Notes (Signed)
Cardiology Office Note   Date:  04/07/2015   ID:  Rachel Park, DOB 1931/03/11, MRN [id number]  PCP:  Mathews Argyle, MD  Cardiologist:   Thayer Headings, MD   Chief Complaint  Patient presents with  . Follow-up    CAD    problem list 1. Coronary artery  disease-status post stenting of her proximal LAD 2.  CVA- occurred during the stenting   History of Present Illness: Rachel Park is a 82 y.o. female who presents for follow up of her stenting She has done well.  Is a bit anxious - may account for her elevated BP today .  Memory has improved ,  No residual CVA symptoms.   Feb. 1, 2017: Doing well Has some fatigue. Has some bruising on lower logs  -  On Plavix   Past Medical History  Diagnosis Date  . Complication of anesthesia   . PONV (postoperative nausea and vomiting)   . Hypothyroidism   . Vertigo, benign positional     to be evaluated, intermittent  . TIA (transient ischemic attack) 03/24/2015    Past Surgical History  Procedure Laterality Date  . Abdominal hysterectomy    . Tonsillectomy    . Cataract extraction Bilateral   . Colonoscopy with propofol N/A 12/01/2013    Procedure: COLONOSCOPY WITH PROPOFOL;  Surgeon: Garlan Fair, MD;  Location: [facility];  Service: Endoscopy;  Laterality: N/A;  . Cardiac catheterization N/A 12/24/2014    Procedure: Left Heart Cath and Coronary Angiography;  Surgeon: Sherren Mocha, MD;  Location: [facility];  Service: Cardiovascular;  Laterality: N/A;     Current Outpatient Prescriptions  Medication Sig Dispense Refill  . acetaminophen (TYLENOL) 325 MG tablet Take 2 tablets (650 mg total) by mouth every 6 (six) hours as needed for mild pain.    Marland Kitchen aspirin EC 81 MG EC tablet Take 1 tablet (81 mg total) by mouth daily.    Marland Kitchen atorvastatin (LIPITOR) 20 MG tablet Take 1 tablet (20 mg total) by mouth daily. 90 tablet 3  . Biotin (BIOTIN MAXIMUM STRENGTH) 10 MG TABS Take 1 tablet by mouth daily.    . Calcium  Carb-Cholecalciferol (CALCIUM 600 + D PO) Take 1 tablet by mouth daily.    . carvedilol (COREG) 12.5 MG tablet Take 12.5 mg by mouth 2 (two) times daily with a meal.    . cholecalciferol (VITAMIN D) 1000 UNITS tablet Take 1,000 Units by mouth daily.    . clopidogrel (PLAVIX) 75 MG tablet Take 1 tablet (75 mg total) by mouth daily with breakfast. 30 tablet 11  . levothyroxine (SYNTHROID, LEVOTHROID) 75 MCG tablet Take 75 mcg by mouth daily before breakfast.    . NITROSTAT 0.4 MG SL tablet Place 1 tablet (0.4 mg total) under the tongue every 5 (five) minutes as needed. Chest pain 25 tablet 2   No current facility-administered medications for this visit.    Allergies:   Review of patient's allergies indicates no known allergies.    Social History:  The patient  reports that she has never smoked. She has never used smokeless tobacco. She reports that she does not drink alcohol or use illicit drugs.   Family History:  The patient's family history includes Cancer in her sister; Heart attack in her father; Stroke in her mother.    ROS:  Please see the history of present illness.    Review of Systems: Constitutional:  denies fever, chills, diaphoresis, appetite change and fatigue.  HEENT:  denies photophobia, eye pain, redness, hearing loss, ear pain, congestion, sore throat, rhinorrhea, sneezing, neck pain, neck stiffness and tinnitus.  Respiratory: denies SOB, DOE, cough, chest tightness, and wheezing.  Cardiovascular: denies chest pain, palpitations and leg swelling.  Gastrointestinal: denies nausea, vomiting, abdominal pain, diarrhea, constipation, blood in stool.  Genitourinary: denies dysuria, urgency, frequency, hematuria, flank pain and difficulty urinating.  Musculoskeletal: denies  myalgias, back pain, joint swelling, arthralgias and gait problem.   Skin: denies pallor, rash and wound.  Neurological: denies dizziness, seizures, syncope, weakness, light-headedness, numbness and  headaches.   Hematological: denies adenopathy, easy bruising, personal or family bleeding history.  Psychiatric/ Behavioral: denies suicidal ideation, mood changes, confusion, nervousness, sleep disturbance and agitation.       All other systems are reviewed and negative.    PHYSICAL EXAM: VS:  BP 120/70 mmHg  Pulse 60  Ht 5' 2.5" (1.588 m)  Wt 126 lb 12.8 oz (57.516 kg)  BMI 22.81 kg/m2 , BMI Body mass index is 22.81 kg/(m^2). GEN: Well nourished, well developed, in no acute distress HEENT: normal Neck: no JVD, carotid bruits, or masses Cardiac: RRR; no murmurs, rubs, or gallops,no edema  Respiratory:  clear to auscultation bilaterally, normal work of breathing GI: soft, nontender, nondistended, + BS MS: no deformity or atrophy Skin: warm and dry, no rash Neuro:  Strength and sensation are intact Psych: normal   EKG:  EKG is not ordered today.   Recent Labs: 12/24/2014: B Natriuretic Peptide 100.3*; TSH 2.481 12/25/2014: Hemoglobin 13.1; Platelets 245 03/31/2015: ALT 47*; BUN 12; Creat 0.47*; Potassium 4.4; Sodium 135    Lipid Panel    Component Value Date/Time   CHOL 142 03/31/2015 0735   TRIG 67 03/31/2015 0735   HDL 70 03/31/2015 0735   CHOLHDL 2.0 03/31/2015 0735   VLDL 13 03/31/2015 0735   LDLCALC 59 03/31/2015 0735      Wt Readings from Last 3 Encounters:  04/07/15 126 lb 12.8 oz (57.516 kg)  03/24/15 128 lb 8 oz (58.287 kg)  01/05/15 120 lb 12.8 oz (54.795 kg)      Other studies Reviewed: Additional studies/ records that were reviewed today include: . Review of the above records demonstrates:    ASSESSMENT AND PLAN:  1.  CAD - doing well after PCI of her LAD . No angina .  2. CVA -  Her stroke symptom have resolved.    3. Hyper lipidemia:  Her cholesterol levels are well controlled. Her liver enzymes were mildly elevated. We decreased the atorvastatin from 40 mg to 20 mg a day. We will recheck these levels in April. I'll see her again in 6  months for follow-up visit.  Current medicines are reviewed at length with the patient today.  The patient does not have concerns regarding medicines.  The following changes have been made:  no change  Labs/ tests ordered today include:  No orders of the defined types were placed in this encounter.     Disposition:   FU with me in 6 months      Nahser, Wonda Cheng, MD  04/07/2015 2:06 PM    [facility] [place], [place], [place]  [address] Phone: [phone]; Fax: [phone]   [facility]  [address] [place], [place]  [address] [phone]   Fax [phone]

## 2015-05-19 ENCOUNTER — Telehealth (HOSPITAL_COMMUNITY): Payer: Self-pay

## 2015-06-05 DIAGNOSIS — K56609 Unspecified intestinal obstruction, unspecified as to partial versus complete obstruction: Secondary | ICD-10-CM

## 2015-06-05 HISTORY — DX: Unspecified intestinal obstruction, unspecified as to partial versus complete obstruction: K56.609

## 2015-06-05 HISTORY — PX: OTHER SURGICAL HISTORY: SHX169

## 2015-06-09 DIAGNOSIS — R079 Chest pain, unspecified: Secondary | ICD-10-CM | POA: Diagnosis not present

## 2015-06-09 DIAGNOSIS — I4891 Unspecified atrial fibrillation: Secondary | ICD-10-CM | POA: Diagnosis not present

## 2015-06-09 DIAGNOSIS — R0689 Other abnormalities of breathing: Secondary | ICD-10-CM | POA: Diagnosis not present

## 2015-06-09 DIAGNOSIS — Z4682 Encounter for fitting and adjustment of non-vascular catheter: Secondary | ICD-10-CM | POA: Diagnosis not present

## 2015-06-09 DIAGNOSIS — J690 Pneumonitis due to inhalation of food and vomit: Secondary | ICD-10-CM | POA: Diagnosis not present

## 2015-06-09 DIAGNOSIS — J9600 Acute respiratory failure, unspecified whether with hypoxia or hypercapnia: Secondary | ICD-10-CM | POA: Diagnosis not present

## 2015-06-09 DIAGNOSIS — E039 Hypothyroidism, unspecified: Secondary | ICD-10-CM | POA: Diagnosis not present

## 2015-06-09 DIAGNOSIS — K5660 Unspecified intestinal obstruction: Secondary | ICD-10-CM | POA: Diagnosis not present

## 2015-06-09 DIAGNOSIS — R1011 Right upper quadrant pain: Secondary | ICD-10-CM | POA: Diagnosis not present

## 2015-06-09 DIAGNOSIS — I480 Paroxysmal atrial fibrillation: Secondary | ICD-10-CM | POA: Diagnosis not present

## 2015-06-09 DIAGNOSIS — J9601 Acute respiratory failure with hypoxia: Secondary | ICD-10-CM | POA: Diagnosis not present

## 2015-06-09 DIAGNOSIS — J984 Other disorders of lung: Secondary | ICD-10-CM | POA: Diagnosis not present

## 2015-06-09 DIAGNOSIS — K631 Perforation of intestine (nontraumatic): Secondary | ICD-10-CM | POA: Diagnosis not present

## 2015-06-09 DIAGNOSIS — R918 Other nonspecific abnormal finding of lung field: Secondary | ICD-10-CM | POA: Diagnosis not present

## 2015-06-09 DIAGNOSIS — K565 Intestinal adhesions [bands] with obstruction (postprocedural) (postinfection): Secondary | ICD-10-CM | POA: Diagnosis not present

## 2015-06-09 DIAGNOSIS — I499 Cardiac arrhythmia, unspecified: Secondary | ICD-10-CM | POA: Diagnosis not present

## 2015-06-09 DIAGNOSIS — K668 Other specified disorders of peritoneum: Secondary | ICD-10-CM | POA: Diagnosis not present

## 2015-06-09 DIAGNOSIS — I10 Essential (primary) hypertension: Secondary | ICD-10-CM | POA: Diagnosis not present

## 2015-06-09 DIAGNOSIS — I071 Rheumatic tricuspid insufficiency: Secondary | ICD-10-CM | POA: Diagnosis not present

## 2015-06-09 DIAGNOSIS — K460 Unspecified abdominal hernia with obstruction, without gangrene: Secondary | ICD-10-CM | POA: Diagnosis not present

## 2015-06-09 DIAGNOSIS — R000 Tachycardia, unspecified: Secondary | ICD-10-CM | POA: Diagnosis not present

## 2015-06-09 DIAGNOSIS — I2510 Atherosclerotic heart disease of native coronary artery without angina pectoris: Secondary | ICD-10-CM | POA: Diagnosis not present

## 2015-06-09 DIAGNOSIS — R1012 Left upper quadrant pain: Secondary | ICD-10-CM | POA: Diagnosis not present

## 2015-06-09 DIAGNOSIS — R0902 Hypoxemia: Secondary | ICD-10-CM | POA: Diagnosis not present

## 2015-06-09 DIAGNOSIS — R578 Other shock: Secondary | ICD-10-CM | POA: Diagnosis not present

## 2015-06-09 DIAGNOSIS — R0600 Dyspnea, unspecified: Secondary | ICD-10-CM | POA: Diagnosis not present

## 2015-06-09 DIAGNOSIS — R001 Bradycardia, unspecified: Secondary | ICD-10-CM | POA: Diagnosis not present

## 2015-06-09 DIAGNOSIS — K5669 Other intestinal obstruction: Secondary | ICD-10-CM | POA: Diagnosis not present

## 2015-06-21 ENCOUNTER — Ambulatory Visit (INDEPENDENT_AMBULATORY_CARE_PROVIDER_SITE_OTHER): Payer: Medicare HMO | Admitting: Cardiovascular Disease

## 2015-06-21 ENCOUNTER — Encounter: Payer: Self-pay | Admitting: Cardiovascular Disease

## 2015-06-21 VITALS — BP 134/80 | HR 72 | Ht 62.5 in | Wt 139.1 lb

## 2015-06-21 DIAGNOSIS — I4819 Other persistent atrial fibrillation: Secondary | ICD-10-CM | POA: Insufficient documentation

## 2015-06-21 DIAGNOSIS — I2510 Atherosclerotic heart disease of native coronary artery without angina pectoris: Secondary | ICD-10-CM

## 2015-06-21 DIAGNOSIS — I4891 Unspecified atrial fibrillation: Secondary | ICD-10-CM | POA: Diagnosis not present

## 2015-06-21 DIAGNOSIS — Z9861 Coronary angioplasty status: Secondary | ICD-10-CM

## 2015-06-21 DIAGNOSIS — I480 Paroxysmal atrial fibrillation: Secondary | ICD-10-CM

## 2015-06-21 MED ORDER — CLOPIDOGREL BISULFATE 75 MG PO TABS: 75.0000 mg | ORAL_TABLET | Freq: Every day | ORAL | Status: DC

## 2015-06-21 MED ORDER — RIVAROXABAN 15 MG PO TABS: 15.0000 mg | ORAL_TABLET | Freq: Two times a day (BID) | ORAL | Status: DC

## 2015-06-21 NOTE — Progress Notes (Signed)
Cardiology Office Note   Date:  06/21/2015   ID:  Rachel Park, DOB 07/27/1931, MRN [id number]  PCP:  Mathews Argyle, MD  Cardiologist:   Thayer Headings, MD   Chief Complaint  Patient presents with  . Atrial Fibrillation    problem list 1. Coronary artery  disease-status post stenting of her proximal LAD 2.  CVA- occurred during the stenting   History of Present Illness: Rachel Park is a 82 y.o. female who presents for follow up of her stenting She has done well.  Is a bit anxious - may account for her elevated BP today .  Memory has improved ,  No residual CVA symptoms.   Feb. 1, 2017: Doing well Has some fatigue. Has some bruising on lower logs  -  On Plavix   June 21, 2015:  Rachel Park was diagnosed with atrial fib, was started on eliquis 5 mg BID.  She was in [place] with her sister. Developed abdominal pain .   Severe hypoxemia .   In great distress. Was found to have a small bowell perforation .   Was in the ICU for 2 days.   Developed hypotension , was started on levophed,   Developed atrial fib  plavix was stopped,   Heparin was started.   Has converted ton NSR at this time  Has had some band like chest pain since that time Has shortness of breath - at rest and with exertion .     Past Medical History  Diagnosis Date  . Complication of anesthesia   . PONV (postoperative nausea and vomiting)   . Hypothyroidism   . Vertigo, benign positional     to be evaluated, intermittent  . TIA (transient ischemic attack) 03/24/2015    Past Surgical History  Procedure Laterality Date  . Abdominal hysterectomy    . Tonsillectomy    . Cataract extraction Bilateral   . Colonoscopy with propofol N/A 12/01/2013    Procedure: COLONOSCOPY WITH PROPOFOL;  Surgeon: Garlan Fair, MD;  Location: [facility];  Service: Endoscopy;  Laterality: N/A;  . Cardiac catheterization N/A 12/24/2014    Procedure: Left Heart Cath and Coronary Angiography;  Surgeon: Sherren Mocha, MD;  Location: [facility];  Service: Cardiovascular;  Laterality: N/A;     Current Outpatient Prescriptions  Medication Sig Dispense Refill  . apixaban (ELIQUIS) 5 MG TABS tablet Take 5 mg by mouth 2 (two) times daily.    Marland Kitchen atorvastatin (LIPITOR) 20 MG tablet Take 1 tablet (20 mg total) by mouth daily. 90 tablet 3  . Biotin (BIOTIN MAXIMUM STRENGTH) 10 MG TABS Take 1 tablet by mouth daily.    . Calcium Carb-Cholecalciferol (CALCIUM 600 + D PO) Take 1 tablet by mouth daily.    . carvedilol (COREG) 12.5 MG tablet Take 12.5 mg by mouth 2 (two) times daily with a meal.    . cholecalciferol (VITAMIN D) 1000 UNITS tablet Take 1,000 Units by mouth daily.    Marland Kitchen levothyroxine (SYNTHROID, LEVOTHROID) 75 MCG tablet Take 75 mcg by mouth daily before breakfast.    . NITROSTAT 0.4 MG SL tablet Place 1 tablet (0.4 mg total) under the tongue every 5 (five) minutes as needed. Chest pain 25 tablet 2  . aspirin EC 81 MG EC tablet Take 1 tablet (81 mg total) by mouth daily. (Patient not taking: Reported on 06/21/2015)     No current facility-administered medications for this visit.    Allergies:   Review of patient's allergies  indicates no known allergies.    Social History:  The patient  reports that she has never smoked. She has never used smokeless tobacco. She reports that she does not drink alcohol or use illicit drugs.   Family History:  The patient's family history includes Cancer in her sister; Heart attack in her father; Stroke in her mother.    ROS:  Please see the history of present illness.    Review of Systems: Constitutional:  denies fever, chills, diaphoresis, appetite change and fatigue.  HEENT: denies photophobia, eye pain, redness, hearing loss, ear pain, congestion, sore throat, rhinorrhea, sneezing, neck pain, neck stiffness and tinnitus.  Respiratory: denies SOB, DOE, cough, chest tightness, and wheezing.  Cardiovascular: denies chest pain, palpitations and leg  swelling.  Gastrointestinal: denies nausea, vomiting, abdominal pain, diarrhea, constipation, blood in stool.  Genitourinary: denies dysuria, urgency, frequency, hematuria, flank pain and difficulty urinating.  Musculoskeletal: denies  myalgias, back pain, joint swelling, arthralgias and gait problem.   Skin: denies pallor, rash and wound.  Neurological: denies dizziness, seizures, syncope, weakness, light-headedness, numbness and headaches.   Hematological: denies adenopathy, easy bruising, personal or family bleeding history.  Psychiatric/ Behavioral: denies suicidal ideation, mood changes, confusion, nervousness, sleep disturbance and agitation.       All other systems are reviewed and negative.    PHYSICAL EXAM: VS:  BP 134/80 mmHg  Pulse 72  Ht 5' 2.5" (1.588 m)  Wt 139 lb 1.9 oz (63.104 kg)  BMI 25.02 kg/m2 , BMI Body mass index is 25.02 kg/(m^2). GEN: Well nourished, well developed, in no acute distress HEENT: normal Neck: no JVD, carotid bruits, or masses Cardiac: RRR; no murmurs, rubs, or gallops,no edema  Respiratory:  clear to auscultation bilaterally, normal work of breathing GI: soft, nontender, nondistended, + BS MS: no deformity or atrophy Skin: warm and dry, no rash Neuro:  Strength and sensation are intact Psych: normal   EKG:  EKG is ordered today. NSR at 72. ,    No ST or T wave changes.   Recent Labs: 12/24/2014: B Natriuretic Peptide 100.3*; TSH 2.481 12/25/2014: Hemoglobin 13.1; Platelets 245 03/31/2015: ALT 47*; BUN 12; Creat 0.47*; Potassium 4.4; Sodium 135    Lipid Panel    Component Value Date/Time   CHOL 142 03/31/2015 0735   TRIG 67 03/31/2015 0735   HDL 70 03/31/2015 0735   CHOLHDL 2.0 03/31/2015 0735   VLDL 13 03/31/2015 0735   LDLCALC 59 03/31/2015 0735      Wt Readings from Last 3 Encounters:  06/21/15 139 lb 1.9 oz (63.104 kg)  04/07/15 126 lb 12.8 oz (57.516 kg)  03/24/15 128 lb 8 oz (58.287 kg)      Other studies  Reviewed: Additional studies/ records that were reviewed today include: . Review of the above records demonstrates:    ASSESSMENT AND PLAN:  1.  CAD - doing well after PCI of her LAD . She's continued to have some bandlike chest pain. We'll schedule her for a [employer] study. We will be restarting her Plavix 75 mg a day.  2. Paroxysmal atrial fib - CHADS2VASC schore of 4-6  ( female, age > 73, vascular disease,   TIA and a CVA ( as a post cath complicatino )   Rachel Park was found have atrial fibrillation in the setting of a small bowel perforation and hypertension. She was on several pressors. The atrial fibrillation has resolved. She was started on Eliquis. Her Plavix was stopped.  She's now converted to normal  sinus rhythm.  At this time I think it would be safer to restart her Plavix 75 mg a day. We'll start her on the Xarelto 15 mg a day.  This lower dose xarelto and Plavix has been studied and has been found to be safe.  She will continue to hold the aspirin.   3. Hyperlipidemia:  Her cholesterol levels are well controlled. Her liver enzymes were mildly elevated.   Will check these again at her next office visit    Current medicines are reviewed at length with the patient today.  The patient does not have concerns regarding medicines.  The following changes have been made:  no change  Labs/ tests ordered today include:  No orders of the defined types were placed in this encounter.     Disposition:   FU with me in 3 months      Nahser, Wonda Cheng, MD  06/21/2015 1:57 PM    [facility] [place], [place], [place]  [address] Phone: [phone]; Fax: [phone]

## 2015-06-21 NOTE — Patient Instructions (Signed)
Medication Instructions:  STOP Eliquis START Xarelto 15 mg once daily - take with heaviest meal TAKE Plavix 75 mg once daily   Labwork: None Ordered   Testing/Procedures: Your physician has requested that you have a lexiscan myoview. For further information please visit [email or web address]. Please follow instruction sheet, as given.   Follow-Up: Your physician recommends that you schedule a follow-up appointment in: 3 months with Dr. Acie Fredrickson.    If you need a refill on your cardiac medications before your next appointment, please call your pharmacy.   Thank you for choosing [facility]! Christen Bame, RN [phone]

## 2015-06-22 ENCOUNTER — Other Ambulatory Visit: Payer: Self-pay | Admitting: Nurse Practitioner

## 2015-06-22 MED ORDER — RIVAROXABAN 15 MG PO TABS: 15.0000 mg | ORAL_TABLET | Freq: Every day | ORAL | Status: DC

## 2015-06-28 ENCOUNTER — Telehealth (HOSPITAL_COMMUNITY): Payer: Self-pay | Admitting: *Deleted

## 2015-06-28 NOTE — Telephone Encounter (Signed)
Patient given detailed instructions per Myocardial Perfusion Study Information Sheet for the test on 06/30/15 at 0745. Patient notified to arrive 15 minutes early and that it is imperative to arrive on time for appointment to keep from having the test rescheduled.  If you need to cancel or reschedule your appointment, please call the office within 24 hours of your appointment. Failure to do so may result in a cancellation of your appointment, and a $50 no show fee. Patient verbalized understanding.Jalien Weakland, Ranae Palms

## 2015-06-30 ENCOUNTER — Other Ambulatory Visit (INDEPENDENT_AMBULATORY_CARE_PROVIDER_SITE_OTHER): Payer: Medicare HMO | Admitting: *Deleted

## 2015-06-30 ENCOUNTER — Ambulatory Visit (HOSPITAL_COMMUNITY): Payer: Medicare HMO | Attending: Cardiovascular Disease

## 2015-06-30 DIAGNOSIS — R0609 Other forms of dyspnea: Secondary | ICD-10-CM | POA: Insufficient documentation

## 2015-06-30 DIAGNOSIS — I10 Essential (primary) hypertension: Secondary | ICD-10-CM | POA: Insufficient documentation

## 2015-06-30 DIAGNOSIS — I480 Paroxysmal atrial fibrillation: Secondary | ICD-10-CM

## 2015-06-30 DIAGNOSIS — I2510 Atherosclerotic heart disease of native coronary artery without angina pectoris: Secondary | ICD-10-CM | POA: Insufficient documentation

## 2015-06-30 DIAGNOSIS — R0602 Shortness of breath: Secondary | ICD-10-CM | POA: Diagnosis not present

## 2015-06-30 DIAGNOSIS — E785 Hyperlipidemia, unspecified: Secondary | ICD-10-CM | POA: Diagnosis not present

## 2015-06-30 DIAGNOSIS — R079 Chest pain, unspecified: Secondary | ICD-10-CM | POA: Insufficient documentation

## 2015-06-30 LAB — MYOCARDIAL PERFUSION IMAGING
CHL CUP NUCLEAR SDS: 2
CHL CUP RESTING HR STRESS: 64 {beats}/min
LVDIAVOL: 89 mL (ref 46–106)
LVSYSVOL: 39 mL
NUC STRESS TID: 1.05
Peak HR: 85 {beats}/min
RATE: 0.29
SRS: 0
SSS: 2

## 2015-06-30 LAB — COMPREHENSIVE METABOLIC PANEL
ALK PHOS: 53 U/L (ref 33–130)
ALT: 16 U/L (ref 6–29)
AST: 18 U/L (ref 10–35)
Albumin: 3.5 g/dL — ABNORMAL LOW (ref 3.6–5.1)
BILIRUBIN TOTAL: 0.4 mg/dL (ref 0.2–1.2)
BUN: 12 mg/dL (ref 7–25)
CO2: 24 mmol/L (ref 20–31)
CREATININE: 0.46 mg/dL — AB (ref 0.60–0.88)
Calcium: 8.7 mg/dL (ref 8.6–10.4)
Chloride: 104 mmol/L (ref 98–110)
GLUCOSE: 94 mg/dL (ref 65–99)
Potassium: 4.8 mmol/L (ref 3.5–5.3)
Sodium: 136 mmol/L (ref 135–146)
Total Protein: 5.9 g/dL — ABNORMAL LOW (ref 6.1–8.1)

## 2015-06-30 LAB — LIPID PANEL
Cholesterol: 152 mg/dL (ref 125–200)
HDL: 65 mg/dL (ref >=46)
LDL Cholesterol: 73 mg/dL (ref <130)
Total CHOL/HDL Ratio: 2.3 Ratio (ref <=5.0)
Triglycerides: 70 mg/dL (ref <150)
VLDL: 14 mg/dL (ref <30)

## 2015-06-30 MED ORDER — TECHNETIUM TC 99M SESTAMIBI GENERIC - CARDIOLITE
32.5000 | Freq: Once | INTRAVENOUS | Status: AC | PRN
  Administered 2015-06-30: 33 via INTRAVENOUS

## 2015-06-30 MED ORDER — REGADENOSON 0.4 MG/5ML IV SOLN
0.4000 mg | Freq: Once | INTRAVENOUS | Status: AC
Start: 2015-06-30 — End: 2015-06-30
  Administered 2015-06-30: 0.4 mg via INTRAVENOUS

## 2015-06-30 MED ORDER — AMINOPHYLLINE 25 MG/ML IV SOLN
75.0000 mg | Freq: Once | INTRAVENOUS | Status: AC
  Administered 2015-06-30: 75 mg via INTRAVENOUS

## 2015-06-30 MED ORDER — TECHNETIUM TC 99M SESTAMIBI GENERIC - CARDIOLITE
10.2000 | Freq: Once | INTRAVENOUS | Status: AC | PRN
  Administered 2015-06-30: 10 via INTRAVENOUS

## 2015-06-30 NOTE — Addendum Note (Signed)
Addended by: Eulis Foster on: 06/30/2015 07:33 AM   Modules accepted: Orders

## 2015-07-01 ENCOUNTER — Other Ambulatory Visit: Payer: Medicare HMO

## 2015-07-07 ENCOUNTER — Telehealth: Payer: Self-pay | Admitting: Cardiovascular Disease

## 2015-07-07 NOTE — Telephone Encounter (Signed)
New Message  Pt req a call back to discuss stress test results

## 2015-07-14 DIAGNOSIS — D649 Anemia, unspecified: Secondary | ICD-10-CM | POA: Diagnosis not present

## 2015-07-14 DIAGNOSIS — L232 Allergic contact dermatitis due to cosmetics: Secondary | ICD-10-CM | POA: Diagnosis not present

## 2015-07-14 DIAGNOSIS — E7800 Pure hypercholesterolemia, unspecified: Secondary | ICD-10-CM | POA: Diagnosis not present

## 2015-07-14 DIAGNOSIS — I10 Essential (primary) hypertension: Secondary | ICD-10-CM | POA: Diagnosis not present

## 2015-07-14 DIAGNOSIS — M8588 Other specified disorders of bone density and structure, other site: Secondary | ICD-10-CM | POA: Diagnosis not present

## 2015-07-14 DIAGNOSIS — Z79899 Other long term (current) drug therapy: Secondary | ICD-10-CM | POA: Diagnosis not present

## 2015-07-14 DIAGNOSIS — E039 Hypothyroidism, unspecified: Secondary | ICD-10-CM | POA: Diagnosis not present

## 2015-07-14 DIAGNOSIS — Z0000 Encounter for general adult medical examination without abnormal findings: Secondary | ICD-10-CM | POA: Diagnosis not present

## 2015-07-14 DIAGNOSIS — Z1389 Encounter for screening for other disorder: Secondary | ICD-10-CM | POA: Diagnosis not present

## 2015-07-22 ENCOUNTER — Other Ambulatory Visit: Payer: Self-pay | Admitting: Surgery

## 2015-07-22 ENCOUNTER — Ambulatory Visit
Admission: RE | Admit: 2015-07-22 | Discharge: 2015-07-22 | Disposition: A | Discharge status: Home / Self Care | Payer: Medicare HMO | Source: Ambulatory Visit | Attending: Surgery | Admitting: Surgery

## 2015-07-22 DIAGNOSIS — R109 Unspecified abdominal pain: Secondary | ICD-10-CM

## 2015-07-22 DIAGNOSIS — R110 Nausea: Secondary | ICD-10-CM | POA: Diagnosis not present

## 2015-07-23 NOTE — Progress Notes (Signed)
Quick Note:  Rachel Park, Please call patient and let her know abdominal xray is fine. No obstruction. I would like her to start on Miralax powder (store brand is fine) once or twice daily. Plan to continue for next few months until we get bilateral inguinal hernias fixed. Thanks, tmg  Earnstine Regal, MD, [facility] Office: [phone]   ______

## 2015-08-12 ENCOUNTER — Other Ambulatory Visit: Payer: Self-pay | Admitting: Nurse Practitioner

## 2015-08-12 ENCOUNTER — Telehealth: Payer: Self-pay | Admitting: Cardiovascular Disease

## 2015-08-12 NOTE — Telephone Encounter (Signed)
  Telephone conversation with her son, Sherren Mocha. That he is having lots of bleeding and is on Plavix and Eliquis.  She had transient atrial fibrillation that occurred in the setting of a small bowel obstruction, while she was on a ventilator and on Levothroid. She's not had any evidence of recurrent atrial fibrillation since that time.  At this point we will discontinue the Eliquis. She will continue Plavix 75 mg a day. I will be seeing her in approximately one month for a follow-up visit.    Mertie Moores, MD  08/12/2015 1:13 PM    [facility] [place],  [place], [place]  [address] Pager [phone] Phone: [phone]; Fax: [phone]

## 2015-08-16 ENCOUNTER — Telehealth: Payer: Self-pay | Admitting: Nurse Practitioner

## 2015-08-16 DIAGNOSIS — D649 Anemia, unspecified: Secondary | ICD-10-CM | POA: Diagnosis not present

## 2015-08-16 NOTE — Telephone Encounter (Signed)
Correction to previous note.     Ms. Heynen was on Xarelto , not Eliquis .    She is having lots of bleeding on the Xarelto and Plavix.    She has not had any further episodes of atrial fib and it is thought that she is at low risk of having further episodes of atrial fib.    Will DC Xarelto and continue plavix for now.                Mertie Moores, MD     08/13/2015 1:31 PM      [place]    [place]    [place], [place] [address]    Pager [phone]    Phone: [phone]; Fax: [phone]

## 2015-08-30 DIAGNOSIS — D509 Iron deficiency anemia, unspecified: Secondary | ICD-10-CM | POA: Diagnosis not present

## 2015-08-30 DIAGNOSIS — D649 Anemia, unspecified: Secondary | ICD-10-CM | POA: Diagnosis not present

## 2015-09-22 ENCOUNTER — Ambulatory Visit (INDEPENDENT_AMBULATORY_CARE_PROVIDER_SITE_OTHER): Payer: Medicare HMO | Admitting: Cardiovascular Disease

## 2015-09-22 ENCOUNTER — Encounter: Payer: Self-pay | Admitting: Cardiovascular Disease

## 2015-09-22 VITALS — BP 150/90 | HR 70 | Ht 62.5 in | Wt 133.8 lb

## 2015-09-22 DIAGNOSIS — I2510 Atherosclerotic heart disease of native coronary artery without angina pectoris: Secondary | ICD-10-CM

## 2015-09-22 DIAGNOSIS — Z9861 Coronary angioplasty status: Secondary | ICD-10-CM

## 2015-09-22 DIAGNOSIS — I480 Paroxysmal atrial fibrillation: Secondary | ICD-10-CM | POA: Diagnosis not present

## 2015-09-22 MED ORDER — NITROSTAT 0.4 MG SL SUBL: 0.4000 mg | SUBLINGUAL_TABLET | SUBLINGUAL | Status: DC | PRN

## 2015-09-22 NOTE — Progress Notes (Signed)
Cardiology Office Note   Date:  09/22/2015   ID:  Rachel Park, DOB 09/24/1931, MRN [id number]  PCP:  Mathews Argyle, MD  Cardiologist:   Mertie Moores, MD   Chief Complaint  Patient presents with  . Follow-up    cad, PAF    problem list 1. Coronary artery  disease-status post stenting of her proximal LAD 2.  CVA- occurred during the stenting   History of Present Illness: Rachel Park is a 82 y.o. female who presents for follow up of her stenting She has done well.  Is a bit anxious - may account for her elevated BP today .  Memory has improved ,  No residual CVA symptoms.   Feb. 1, 2017: Doing well Has some fatigue. Has some bruising on lower logs  -  On Plavix   June 21, 2015:  Rachel Park was diagnosed with atrial fib, was started on eliquis 5 mg BID.  She was in [place] with her sister. Developed abdominal pain .   Severe hypoxemia .   In great distress. Was found to have a small bowell perforation .   Was in the ICU for 2 days.   Developed hypotension , was started on levophed,   Developed atrial fib  plavix was stopped,   Heparin was started.   Has converted ton NSR at this time  Has had some band like chest pain since that time Has shortness of breath - at rest and with exertion .     September 22, 2015:  Rachel Park is doing well.  We DC'd her Eliquis - due to bleeding.   She is on Plavix  Her only episode of a-fib was in the setting of a small bowel obstruction   Is very fatigued.   Was put on Iron tablet by Dr. Felipa Eth. Hb is still low  Rachel Park also increased her Coreg to 25 BID .  BP readings at home have been well controlled.  Has not had any syncope Goes for a walk every day and is fatigued when she is done with the walk.   No CP .  Has occasional chest tightness .  Same symptoms back in April - Rachel Park was normal in April, 2017   Past Medical History  Diagnosis Date  . Complication of anesthesia   . PONV (postoperative nausea and  vomiting)   . Hypothyroidism   . Vertigo, benign positional     to be evaluated, intermittent  . TIA (transient ischemic attack) 03/24/2015    Past Surgical History  Procedure Laterality Date  . Abdominal hysterectomy    . Tonsillectomy    . Cataract extraction Bilateral   . Colonoscopy with propofol N/A 12/01/2013    Procedure: COLONOSCOPY WITH PROPOFOL;  Surgeon: Garlan Fair, MD;  Location: [facility];  Service: Endoscopy;  Laterality: N/A;  . Cardiac catheterization N/A 12/24/2014    Procedure: Left Heart Cath and Coronary Angiography;  Surgeon: Sherren Mocha, MD;  Location: [facility];  Service: Cardiovascular;  Laterality: N/A;     Current Outpatient Prescriptions  Medication Sig Dispense Refill  . atorvastatin (LIPITOR) 20 MG tablet Take 1 tablet (20 mg total) by mouth daily. 90 tablet 3  . Biotin (BIOTIN MAXIMUM STRENGTH) 10 MG TABS Take 1 tablet by mouth daily.    . Calcium Carb-Cholecalciferol (CALCIUM 600 + D PO) Take 1 tablet by mouth daily.    . carvedilol (COREG) 25 MG tablet Take 25 mg by mouth 2 (two) times daily.    Marland Kitchen  cholecalciferol (VITAMIN D) 1000 UNITS tablet Take 1,000 Units by mouth daily.    . clopidogrel (PLAVIX) 75 MG tablet Take 1 tablet (75 mg total) by mouth daily. 30 tablet 6  . levothyroxine (SYNTHROID, LEVOTHROID) 75 MCG tablet Take 75 mcg by mouth daily before breakfast.    . NITROSTAT 0.4 MG SL tablet Place 1 tablet (0.4 mg total) under the tongue every 5 (five) minutes as needed. Chest pain 25 tablet 2   No current facility-administered medications for this visit.    Allergies:   Review of patient's allergies indicates no known allergies.    Social History:  The patient  reports that she has never smoked. She has never used smokeless tobacco. She reports that she does not drink alcohol or use illicit drugs.   Family History:  The patient's family history includes Cancer in her sister; Heart attack in her father; Stroke in her  mother.    ROS:  Please see the history of present illness.    Review of Systems: Constitutional:  denies fever, chills, diaphoresis, appetite change and fatigue.  HEENT: denies photophobia, eye pain, redness, hearing loss, ear pain, congestion, sore throat, rhinorrhea, sneezing, neck pain, neck stiffness and tinnitus.  Respiratory: denies SOB, DOE, cough, chest tightness, and wheezing.  Cardiovascular: denies chest pain, palpitations and leg swelling.  Gastrointestinal: denies nausea, vomiting, abdominal pain, diarrhea, constipation, blood in stool.  Genitourinary: denies dysuria, urgency, frequency, hematuria, flank pain and difficulty urinating.  Musculoskeletal: denies  myalgias, back pain, joint swelling, arthralgias and gait problem.   Skin: denies pallor, rash and wound.  Neurological: denies dizziness, seizures, syncope, weakness, light-headedness, numbness and headaches.   Hematological: denies adenopathy, easy bruising, personal or family bleeding history.  Psychiatric/ Behavioral: denies suicidal ideation, mood changes, confusion, nervousness, sleep disturbance and agitation.       All other systems are reviewed and negative.    PHYSICAL EXAM: VS:  BP 150/90 mmHg  Pulse 70  Ht 5' 2.5" (1.588 m)  Wt 133 lb 12.8 oz (60.691 kg)  BMI 24.07 kg/m2 , BMI Body mass index is 24.07 kg/(m^2). GEN: Well nourished, well developed, in no acute distress HEENT: normal Neck: no JVD, carotid bruits, or masses Cardiac: RRR; no murmurs, rubs, or gallops,no edema  Respiratory:  clear to auscultation bilaterally, normal work of breathing GI: soft, nontender, nondistended, + BS MS: no deformity or atrophy Skin: warm and dry, no rash Neuro:  Strength and sensation are intact Psych: normal   EKG:  EKG is ordered today. NSR at 72. ,    No ST or T wave changes.   Recent Labs: 12/24/2014: B Natriuretic Peptide 100.3*; TSH 2.481 12/25/2014: Hemoglobin 13.1; Platelets 245 06/30/2015: ALT  16; BUN 12; Creat 0.46*; Potassium 4.8; Sodium 136    Lipid Panel    Component Value Date/Time   CHOL 152 06/30/2015 0848   TRIG 70 06/30/2015 0848   HDL 65 06/30/2015 0848   CHOLHDL 2.3 06/30/2015 0848   VLDL 14 06/30/2015 0848   LDLCALC 73 06/30/2015 0848      Wt Readings from Last 3 Encounters:  09/22/15 133 lb 12.8 oz (60.691 kg)  06/21/15 139 lb 1.9 oz (63.104 kg)  04/07/15 126 lb 12.8 oz (57.516 kg)      Other studies Reviewed: Additional studies/ records that were reviewed today include: . Review of the above records demonstrates:    ASSESSMENT AND PLAN:  1.  CAD - doing well after PCI of her LAD .  She's not  having any additional symptoms. We performed a Myoview study in April which was normal..  2. Paroxysmal atrial fib - CHADS2VASC schore of 4-6  ( female, age > 64, vascular disease,   TIA and a CVA ( as a post cath complicatino )   Rachel Park was found have atrial fibrillation in the setting of a small bowel perforation and hypertension. She was on several pressors. The atrial fibrillation has resolved. She was started on Eliquis but has had some problems with GI bleeding and anemia. We have stopped the Eliquis since she is not had any recurrent atrial fibrillation.  She should be up to stop her Plavix in September.   She's now converted to normal sinus rhythm.     3. Hyperlipidemia:  Her cholesterol levels are well controlled.   Current medicines are reviewed at length with the patient today.  The patient does not have concerns regarding medicines.  The following changes have been made:  no change  Labs/ tests ordered today include:  No orders of the defined types were placed in this encounter.    Disposition:   FU with me in 6 months      Mertie Moores, MD  09/22/2015 10:56 AM    [facility] [place], [place], [place]  [address] Phone: [phone]; Fax: [phone]

## 2015-09-22 NOTE — Patient Instructions (Signed)
Medication Instructions:  Your physician recommends that you continue on your current medications as directed. Please refer to the Current Medication list given to you today.   Labwork: None Ordered   Testing/Procedures: None Ordered   Follow-Up: Your physician wants you to follow-up in: 6 months with Dr. Nahser.  You will receive a reminder letter in the mail two months in advance. If you don't receive a letter, please call our office to schedule the follow-up appointment.   If you need a refill on your cardiac medications before your next appointment, please call your pharmacy.   Thank you for choosing [facility]! Tyree Vandruff, RN [phone]    

## 2015-09-29 DIAGNOSIS — M8588 Other specified disorders of bone density and structure, other site: Secondary | ICD-10-CM | POA: Diagnosis not present

## 2015-10-11 DIAGNOSIS — D509 Iron deficiency anemia, unspecified: Secondary | ICD-10-CM | POA: Diagnosis not present

## 2015-10-27 DIAGNOSIS — I10 Essential (primary) hypertension: Secondary | ICD-10-CM | POA: Diagnosis not present

## 2015-10-27 DIAGNOSIS — K560 Paralytic ileus: Secondary | ICD-10-CM | POA: Diagnosis not present

## 2015-10-27 DIAGNOSIS — Z0000 Encounter for general adult medical examination without abnormal findings: Secondary | ICD-10-CM | POA: Diagnosis not present

## 2015-10-27 DIAGNOSIS — I4892 Unspecified atrial flutter: Secondary | ICD-10-CM | POA: Diagnosis not present

## 2015-10-27 DIAGNOSIS — I4891 Unspecified atrial fibrillation: Secondary | ICD-10-CM | POA: Diagnosis not present

## 2015-10-27 DIAGNOSIS — E7800 Pure hypercholesterolemia, unspecified: Secondary | ICD-10-CM | POA: Diagnosis not present

## 2015-10-27 DIAGNOSIS — E039 Hypothyroidism, unspecified: Secondary | ICD-10-CM | POA: Diagnosis not present

## 2015-12-06 DIAGNOSIS — H35371 Puckering of macula, right eye: Secondary | ICD-10-CM | POA: Diagnosis not present

## 2015-12-06 DIAGNOSIS — H52203 Unspecified astigmatism, bilateral: Secondary | ICD-10-CM | POA: Diagnosis not present

## 2015-12-06 DIAGNOSIS — H35341 Macular cyst, hole, or pseudohole, right eye: Secondary | ICD-10-CM | POA: Diagnosis not present

## 2015-12-06 DIAGNOSIS — H43813 Vitreous degeneration, bilateral: Secondary | ICD-10-CM | POA: Diagnosis not present

## 2015-12-06 DIAGNOSIS — R69 Illness, unspecified: Secondary | ICD-10-CM | POA: Diagnosis not present

## 2015-12-09 DIAGNOSIS — L570 Actinic keratosis: Secondary | ICD-10-CM | POA: Diagnosis not present

## 2015-12-09 DIAGNOSIS — L739 Follicular disorder, unspecified: Secondary | ICD-10-CM | POA: Diagnosis not present

## 2015-12-09 DIAGNOSIS — Z85828 Personal history of other malignant neoplasm of skin: Secondary | ICD-10-CM | POA: Diagnosis not present

## 2015-12-09 DIAGNOSIS — D485 Neoplasm of uncertain behavior of skin: Secondary | ICD-10-CM | POA: Diagnosis not present

## 2015-12-09 DIAGNOSIS — L821 Other seborrheic keratosis: Secondary | ICD-10-CM | POA: Diagnosis not present

## 2015-12-09 DIAGNOSIS — D1801 Hemangioma of skin and subcutaneous tissue: Secondary | ICD-10-CM | POA: Diagnosis not present

## 2015-12-14 ENCOUNTER — Telehealth: Payer: Self-pay | Admitting: Cardiovascular Disease

## 2015-12-14 MED ORDER — ASPIRIN EC 81 MG PO TBEC: 81.0000 mg | DELAYED_RELEASE_TABLET | Freq: Every day | ORAL | Status: DC

## 2015-12-14 NOTE — Telephone Encounter (Signed)
Per last office visit with Dr. Acie Fredrickson on 09/22/15:   She should be up to stop her Plavix in September.   She's now converted to normal sinus rhythm.  I am routing to Dr. Acie Fredrickson for agreement that she may go ahead and stop Plavix.

## 2015-12-14 NOTE — Telephone Encounter (Signed)
Agree with stopping plavix in September ( now) She should continue with an ASA 81 mg a day in place of the plavix Thanks

## 2015-12-14 NOTE — Telephone Encounter (Signed)
New message    Pt verbalized that she wants to speak to the rn beause she was under the impression that she can come off the Plavix 75mg  after a year    Please call and confirm

## 2015-12-14 NOTE — Telephone Encounter (Signed)
Left detailed message on patient's home voice mail that she may stop Plavix (Clopidogrel) and start Aspirin 81 mg.  I advised her to call back with questions or concerns.

## 2016-01-04 ENCOUNTER — Ambulatory Visit: Payer: Self-pay | Admitting: Surgery

## 2016-01-12 DIAGNOSIS — M25552 Pain in left hip: Secondary | ICD-10-CM | POA: Diagnosis not present

## 2016-01-12 DIAGNOSIS — Z79899 Other long term (current) drug therapy: Secondary | ICD-10-CM | POA: Diagnosis not present

## 2016-01-12 DIAGNOSIS — I10 Essential (primary) hypertension: Secondary | ICD-10-CM | POA: Diagnosis not present

## 2016-01-12 DIAGNOSIS — E7800 Pure hypercholesterolemia, unspecified: Secondary | ICD-10-CM | POA: Diagnosis not present

## 2016-01-12 DIAGNOSIS — M810 Age-related osteoporosis without current pathological fracture: Secondary | ICD-10-CM | POA: Diagnosis not present

## 2016-01-12 DIAGNOSIS — E039 Hypothyroidism, unspecified: Secondary | ICD-10-CM | POA: Diagnosis not present

## 2016-01-12 DIAGNOSIS — M8580 Other specified disorders of bone density and structure, unspecified site: Secondary | ICD-10-CM | POA: Diagnosis not present

## 2016-01-17 DIAGNOSIS — E875 Hyperkalemia: Secondary | ICD-10-CM | POA: Diagnosis not present

## 2016-02-23 ENCOUNTER — Ambulatory Visit: Payer: Self-pay | Admitting: Surgery

## 2016-02-23 DIAGNOSIS — K4020 Bilateral inguinal hernia, without obstruction or gangrene, not specified as recurrent: Secondary | ICD-10-CM | POA: Diagnosis not present

## 2016-02-23 DIAGNOSIS — K432 Incisional hernia without obstruction or gangrene: Secondary | ICD-10-CM | POA: Diagnosis not present

## 2016-02-25 ENCOUNTER — Encounter (HOSPITAL_COMMUNITY): Payer: Self-pay

## 2016-02-25 NOTE — Pre-Procedure Instructions (Signed)
Rachel Park  02/25/2016      [place], [place] [id number] [place] [address] Phone: [phone] Fax: [phone]    Your procedure is scheduled on March 10, 2016.  Report to [facility] Admitting at [facility].  Call this number if you have problems the morning of surgery:  [phone]   Remember:  Do not eat food or drink liquids after midnight.  Take these medicines the morning of surgery with A SIP OF WATER Carvedilol (Coreg), Levothyroxine (Synthroid), Nitrostat if needed, Systane  eye drops if needed.  7 days prior to surgery STOP taking any Aspirin, Aleve, Naproxen, Ibuprofen, Motrin, Advil, Goody's, BC's, all herbal medications, fish oil, and all vitamins   Do not wear jewelry, make-up or nail polish.  Do not wear lotions, powders, or perfumes, or deodorant.  Do not shave 48 hours prior to surgery.  Do not bring valuables to the hospital.  [facility] is not responsible for any belongings or valuables.  Contacts, dentures or bridgework may not be worn into surgery.  Leave your suitcase in the car.  After surgery it may be brought to your room.  For patients admitted to the hospital, discharge time will be determined by your treatment team.  Patients discharged the day of surgery will not be allowed to drive home.   Name and phone number of your driver:    Special instructions:   [facility]- Preparing For Surgery  Before surgery, you can play an important role. Because skin is not sterile, your skin needs to be as free of germs as possible. You can reduce the number of germs on your skin by washing with CHG (chlorahexidine gluconate) Soap before surgery.  CHG is an antiseptic cleaner which kills germs and bonds with the skin to continue killing germs even after washing.  Please do not use if you have an allergy to CHG or antibacterial soaps. If your skin becomes reddened/irritated stop using  the CHG.  Do not shave (including legs and underarms) for at least 48 hours prior to first CHG shower. It is OK to shave your face.  Please follow these instructions carefully.   1. Shower the NIGHT BEFORE SURGERY and the MORNING OF SURGERY with CHG.   2. If you chose to wash your hair, wash your hair first as usual with your normal shampoo.  3. After you shampoo, rinse your hair and body thoroughly to remove the shampoo.  4. Use CHG as you would any other liquid soap. You can apply CHG directly to the skin and wash gently with a scrungie or a clean washcloth.   5. Apply the CHG Soap to your body ONLY FROM THE NECK DOWN.  Do not use on open wounds or open sores. Avoid contact with your eyes, ears, mouth and genitals (private parts). Wash genitals (private parts) with your normal soap.  6. Wash thoroughly, paying special attention to the area where your surgery will be performed.  7. Thoroughly rinse your body with warm water from the neck down.  8. DO NOT shower/wash with your normal soap after using and rinsing off the CHG Soap.  9. Pat yourself dry with a CLEAN TOWEL.   10. Wear CLEAN PAJAMAS   11. Place CLEAN SHEETS on your bed the night of your first shower and DO NOT SLEEP WITH PETS.    Day of Surgery: Do not apply any deodorants/lotions. Please wear clean clothes to  the hospital/surgery center.      Please read over the following fact sheets that you were given. Pain Booklet, Coughing and Deep Breathing and Surgical Site Infection Prevention

## 2016-02-29 ENCOUNTER — Encounter (HOSPITAL_COMMUNITY): Payer: Self-pay

## 2016-02-29 ENCOUNTER — Encounter (HOSPITAL_COMMUNITY)
Admission: RE | Admit: 2016-02-29 | Discharge: 2016-02-29 | Disposition: A | Discharge status: Home / Self Care | Payer: Medicare HMO | Source: Ambulatory Visit | Attending: Surgery | Admitting: Surgery

## 2016-02-29 DIAGNOSIS — E039 Hypothyroidism, unspecified: Secondary | ICD-10-CM | POA: Insufficient documentation

## 2016-02-29 DIAGNOSIS — I200 Unstable angina: Secondary | ICD-10-CM | POA: Insufficient documentation

## 2016-02-29 DIAGNOSIS — Z9861 Coronary angioplasty status: Secondary | ICD-10-CM | POA: Insufficient documentation

## 2016-02-29 DIAGNOSIS — Z01812 Encounter for preprocedural laboratory examination: Secondary | ICD-10-CM | POA: Insufficient documentation

## 2016-02-29 DIAGNOSIS — Z8673 Personal history of transient ischemic attack (TIA), and cerebral infarction without residual deficits: Secondary | ICD-10-CM | POA: Insufficient documentation

## 2016-02-29 DIAGNOSIS — I10 Essential (primary) hypertension: Secondary | ICD-10-CM | POA: Diagnosis not present

## 2016-02-29 DIAGNOSIS — J449 Chronic obstructive pulmonary disease, unspecified: Secondary | ICD-10-CM | POA: Diagnosis not present

## 2016-02-29 DIAGNOSIS — I4891 Unspecified atrial fibrillation: Secondary | ICD-10-CM | POA: Insufficient documentation

## 2016-02-29 DIAGNOSIS — I6340 Cerebral infarction due to embolism of unspecified cerebral artery: Secondary | ICD-10-CM | POA: Diagnosis not present

## 2016-02-29 HISTORY — DX: Essential (primary) hypertension: I10

## 2016-02-29 HISTORY — DX: Unspecified osteoarthritis, unspecified site: M19.90

## 2016-02-29 HISTORY — DX: Dyspnea, unspecified: R06.00

## 2016-02-29 HISTORY — DX: Angina pectoris, unspecified: I20.9

## 2016-02-29 HISTORY — DX: Unspecified intestinal obstruction, unspecified as to partial versus complete obstruction: K56.609

## 2016-02-29 HISTORY — DX: Cerebral infarction, unspecified: I63.9

## 2016-02-29 HISTORY — DX: Atherosclerotic heart disease of native coronary artery without angina pectoris: I25.10

## 2016-02-29 LAB — BASIC METABOLIC PANEL
ANION GAP: 7 (ref 5–15)
BUN: 12 mg/dL (ref 6–20)
CALCIUM: 8.8 mg/dL — AB (ref 8.9–10.3)
CO2: 25 mmol/L (ref 22–32)
CREATININE: 0.47 mg/dL (ref 0.44–1.00)
Chloride: 105 mmol/L (ref 101–111)
GFR calc Af Amer: >60 mL/min (ref >60)
GFR calc non Af Amer: >60 mL/min (ref >60)
GLUCOSE: 112 mg/dL — AB (ref 65–99)
POTASSIUM: 4.2 mmol/L (ref 3.5–5.1)
SODIUM: 137 mmol/L (ref 135–145)

## 2016-02-29 LAB — CBC
HCT: 35.6 % — ABNORMAL LOW (ref 36.0–46.0)
Hemoglobin: 11.9 g/dL — ABNORMAL LOW (ref 12.0–15.0)
MCH: 31 pg (ref 26.0–34.0)
MCHC: 33.4 g/dL (ref 30.0–36.0)
MCV: 92.7 fL (ref 78.0–100.0)
PLATELETS: 185 10*3/uL (ref 150–400)
RBC: 3.84 MIL/uL — ABNORMAL LOW (ref 3.87–5.11)
RDW: 14.1 % (ref 11.5–15.5)
WBC: 8.1 10*3/uL (ref 4.0–10.5)

## 2016-02-29 NOTE — Progress Notes (Addendum)
PCP Hal Stoneking Cardiologist Nahser Echo 12/25/14 Cath 12/24/14 Stress Test 06/30/15

## 2016-03-01 NOTE — Progress Notes (Signed)
Anesthesia Chart Review: Patient is a 82 year old female scheduled for open repair bilateral inguinal hernias with mesh, open repair of ventral incisional hernia with mesh on 03/10/2016 by Dr. Harlow Asa. (Patient is Dr. Luther Parody mother.)  History includes CAD complicated by post-procedure TIA 12/2014 (small late subacute to early chronic appearing lacunar infarct in the right MCA territory by 02/2015 MRI), nonsmoker, postoperative nausea vomiting, hypothyroidism, BPPV, hypertension, exertional dyspnea, arthritis, small bowel obstruction s/p laparotomy 06/2015 (while visiting sister in [place]), tonsillectomy, hysterectomy. She had a single episode of afib during her [facility] hospitalization and was put on Xarelto, but this was later discontinued due to bleeding.  - PCP is Dr. Lajean Manes. - Cardiologist is Dr. Mertie Moores, last visit 10/02/15.  - Neurologist is Dr. Elon Alas. Willis. He saw her on 03/24/15. The thought patient's small right parietal CVA was associated with embolus during the heart catheterization procedure. Clinically, he thought this was still a TIA event. He recommended continued PCP and cardiology follow-up with PRN neurology follow-up.  Meds include aspirin 81 mg, Lipitor, Coreg, levothyroxine, nitroglycerin. (Plavix discontinued 12/14/15 per cardiology.) PAT RN instructions list for patient to hold NSAIDS and ASA for 7 days prior to surgery. I called Dr. Harlow Asa to clarify since patient with DES and CVA history, although both > 12 months ago. [facility] triage RN Sunday Spillers called back and stated that Dr. Harlow Asa recommended holding ASA for 5 days prior to surgery. I have notified patient to hold ASA 5 days as instructed by Dr. Harlow Asa, but to continue to hold other NSAIDS for 7 days prior to surgery.   Nuclear Stress Test 06/30/15:  Nuclear stress EF: 56%. No wall motion abnormalities  There was no ST segment deviation noted during stress.  This is a low risk study. No ischemia identified.   EKG  06/21/15: Sinus rhythm with short PR and PACs, low voltage QRS.  2D echo 12/25/14: Study Conclusions - Left ventricle: The cavity size was normal. Wall thickness was normal. Systolic function was normal. The estimated ejection fraction was in the range of 55% to 60%. Wall motion was normal; there were no regional wall motion abnormalities. There was no evidence of elevated ventricular filling pressure by Doppler parameters. - Mitral valve: Calcified annulus. Impressions: - No cardiac source of emboli was identified.  Cardiac cath 12/24/14:  Ost RCA lesion, 30% stenosed.  Ost LM to LM lesion, 30% stenosed.  Mid Cx lesion, 25% stenosed.  Dist LAD lesion, 40% stenosed.  Prox LAD lesion, 90% stenosed. Post intervention, there is a 0% residual stenosis.  The left ventricular systolic function is normal. 1. Severe single vessel CAD with successful stenting of severe proximal LAD stenosis 2. Mild nonobstructive plaquing in the left main, RCA, and left circumflex 3. Normal/vigorous LV systolic function DAPT with ASA and plavix at least 12 months  MRI brain 02/12/15: IMPRESSION: 1. Small late subacute to early chronic appearing white matter lacunar infarct in the right MCA territory (centrum semiovale). No hemorrhage or mass effect. 2. No acute intracranial abnormality. Mild to moderate for age scattered white matter signal changes elsewhere, most commonly due to chronic small vessel disease. 3. The small hypodense area seen in October by CT at the left lentiform nucleus is a perivascular space (normal anatomic variant). No previous cortically based infarct identified.  CTA head/neck 12/25/14: IMPRESSION: 1. Stable age indeterminate infarct just inferior to the left lentiform nucleus. 2. Stable atrophy and white matter disease. 3. Moderate stenosis of the proximal left M1 segment with  asymmetric attenuation of left MCA branch vessels compared to the right. 4. Mild to  moderate distal right M1 segment stenosis. 5. Mild diffuse distal small vessel disease. 6. Bilateral carotid bifurcations show calcifications without significant stenosis.  Preoperative labs noted.  I reviewed above with anesthesiologist Dr. Orene Desanctis including surgeon's order to hold ASA for 5 days prior to surgery. If no acute changes then it is anticipated that she can proceed as planned. Following her hospitalization, she will recover at her son's home.  [facility] Short Stay Center/Anesthesiology Phone [phone] 03/01/2016 2:34 PM

## 2016-03-07 ENCOUNTER — Encounter: Payer: Self-pay | Admitting: Surgery

## 2016-03-07 ENCOUNTER — Ambulatory Visit: Payer: Self-pay | Admitting: Surgery

## 2016-03-07 DIAGNOSIS — K432 Incisional hernia without obstruction or gangrene: Secondary | ICD-10-CM | POA: Diagnosis present

## 2016-03-07 DIAGNOSIS — K4020 Bilateral inguinal hernia, without obstruction or gangrene, not specified as recurrent: Secondary | ICD-10-CM | POA: Diagnosis present

## 2016-03-07 NOTE — H&P (Signed)
General Surgery [facility]  Breece Browe 02/23/2016 10:53 AM Location: [facility] Patient #: [id number] DOB: June 01, 1931 Divorced / Language: Cleophus Molt / Race: White Female   History of Present Illness Rachel Regal MD; 02/23/2016 11:45 AM) The patient is a 83 year old female who presents with an incisional hernia.  Patient returns for preoperative assessment and evaluation of what appears to be new hernias on the abdominal wall. Patient is scheduled for bilateral inguinal hernia repair in early January 2018. She longer requires anticoagulation. She continues to have intermittent abdominal discomfort. She is noted at the left inguinal hernia has increased in size. She has begun taking MiraLAX again for chronic constipation. She is scheduled to see anesthesia next week for her preoperative assessment.   Allergies Mammie Lorenzo, LPN; [id number] [id number] AM) No Known Drug Allergies 06/23/2015  Medication History Mammie Lorenzo, LPN; [id number] AM) Aspirin (81MG  Tablet DR, Oral) Active. Atorvastatin Calcium (20MG  Tablet, Oral) Active. Carvedilol (25MG  Tablet, Oral) Active. Levothyroxine Sodium (75MCG Tablet, Oral) Active. Calcium-Vitamin D (600MG  Tablet Chewable, Oral) Active. Nitro-Transdermal (0.4MG /HR , Transdermal) Active. Vitamin D (Cholecalciferol) (1000UNIT Capsule, Oral) Active. Medications Reconciled  Vitals Claiborne Billings Dockery LPN; D34-534 D34-534 AM) 02/23/2016 10:53 AM Weight: 132.8 lb Height: 62.5in Body Surface Area: 1.62 m Body Mass Index: 23.9 kg/m  Temp.: 97.65F(Oral)  Pulse: 60 (Regular)  BP: 132/78 (Sitting, Left Arm, Standard)       Physical Exam Rachel Regal MD; 02/23/2016 11:46 AM) The physical exam findings are as follows: Note:General - appears comfortable, no distress; not diaphorectic  HEENT - normocephalic; sclerae clear, gaze conjugate; mucous membranes moist, dentition good; voice  normal  Neck - symmetric on extension; no palpable anterior or posterior cervical adenopathy; no palpable masses in the thyroid bed  Chest - clear bilaterally without rhonchi, rales, or wheeze  Cor - regular rhythm with normal rate; no significant murmur  Abd - soft without distension; midline incision appears well healed; with Valsalva and sit up maneuver, there are 2 areas of incisional hernia at the umbilicus and at the inferior most aspect of the midline incision. These are spontaneously reducible. Fascial defect measures 1-2 cm at each location. Remainder of the midline incision appears intact.  GU - bilateral inguinal hernias are again appreciated with the left inguinal hernia much larger than the right; both are easily reducible; both are mildly tender to palpation  Ext - non-tender without significant edema or lymphedema  Neuro - grossly intact; no tremor    Assessment & Plan Rachel Regal MD; 02/23/2016 11:49 AM) NON-RECURRENT BILATERAL INGUINAL HERNIA WITHOUT OBSTRUCTION OR GANGRENE (K40.20) INCISIONAL HERNIA, WITHOUT OBSTRUCTION OR GANGRENE (K43.2) Current Plans Instructed to keep surgery appointment as scheduled  Patient presents today for preoperative assessment in anticipation of bilateral inguinal hernia repair with mesh to be performed in early January 2018. On examination today, she has shown progression of the left inguinal hernia with a larger inguinal bulge. Both inguinal hernias are easily reducible. On exam, she has developed incisional hernia at the level of the umbilicus and at the inferior most aspect of her midline incision. These are spontaneously reducible. Fascial defects are small.  Patient and I discussed options for management. At this point I would favor repairing all 4 sites at the time of her procedure at January. This may slightly increase her postoperative discomfort and may slightly prolong her recovery. However I think it would be more  difficult to stage these procedures. Patient is in agreement. I will  also discuss this with her son who is a Hydrographic surveyor.  We will change the orders for her surgical procedure. We will plan to proceed as scheduled in January.  The risks and benefits of the procedure have been discussed at length with the patient. The patient understands the proposed procedure, potential alternative treatments, and the course of recovery to be expected. All of the patient's questions have been answered at this time. The patient wishes to proceed with surgery.  Rachel Regal, MD, [facility] Office: [phone]     INITIAL CONSULTATION:  Rachel Park 06/23/2015 12:21 PM Location: [facility] Patient #: [id number] DOB: [birth year] Divorced / Language: Cleophus Molt / Race: White Female   History of Present Illness Rachel Regal MD; 06/23/2015 12:38 PM) Patient words: wound check.  The patient is a 83 year old female who presents for wound check.   Patient presents for wound check. Patient is the mother of Dr. Sherren Mocha Early, vascular surgery. Patient underwent urgent laparotomy in [place], [place], 3 weeks ago. She had an internal hernia with intestinal ischemia. She had an open wound for 3 days and then underwent second procedure for abdominal closure. She has since returned home. She presents today for wound check and staple removal. Patient states that she is doing well. She denies any significant abdominal pain. She is off all of her pain medication. She is tolerating a regular diet and having normal bowel movements. He is accompanied today by her daughter-in-law.   Other Problems Mammie Lorenzo, LPN; QA348G QA348G PM) Cerebrovascular Accident  High blood pressure  Inguinal Hernia  Thyroid Disease   Past Surgical History Mammie Lorenzo, LPN; QA348G QA348G PM) Cataract Surgery  Bilateral. Hysterectomy (not due to cancer) - Partial   Resection of Small Bowel   Diagnostic Studies History Mammie Lorenzo, LPN; QA348G QA348G PM) Colonoscopy  within last year Mammogram  1-3 years ago  Allergies Mammie Lorenzo, LPN; QA348G QA348G PM) No Known Drug Allergies 06/23/2015  Medication History Mammie Lorenzo, LPN; QA348G [id number] PM) Atorvastatin Calcium (20MG  Tablet, Oral) Active. Biotin (10MG  Tablet, Oral) Active. Calcium-Vitamin D (600MG  Tablet Chewable, Oral) Active. Vitamin D (Cholecalciferol) (1000UNIT Capsule, Oral) Active. Carvedilol (12.5MG  Tablet, Oral) Active. Clopidogrel Bisulfate (75MG  Tablet, Oral) Active. Levothyroxine Sodium (75MCG Tablet, Oral) Active. Xarelto (15MG  Tablet, Oral) Active. Nitro-Transdermal (0.4MG /HR , Transdermal) Active.  Social History Mammie Lorenzo, LPN; QA348G [id number] PM) Alcohol use  Occasional alcohol use. Caffeine use  Coffee. No drug use  Tobacco use  Never smoker.  Family History Mammie Lorenzo, LPN; QA348G QA348G PM) Arthritis  Father. Breast Cancer  Sister. Heart Disease  Father.  Pregnancy / Birth History Mammie Lorenzo, LPN; QA348G [id number] PM) Age at menarche  73 years. Age of menopause  >46 Gravida  2 Maternal age  86-25 Para  2    Review of Systems Mammie Lorenzo LPN; QA348G [id number] PM) General Not Present- Appetite Loss, Chills, Fatigue, Fever, Night Sweats, Weight Gain and Weight Loss. Skin Not Present- Change in Wart/Mole, Dryness, Hives, Jaundice, New Lesions, Non-Healing Wounds, Rash and Ulcer. HEENT Not Present- Earache, Hearing Loss, Hoarseness, Nose Bleed, Oral Ulcers, Ringing in the Ears, Seasonal Allergies, Sinus Pain, Sore Throat, Visual Disturbances, Wears glasses/contact lenses and Yellow Eyes. Respiratory Not Present- Bloody sputum, Chronic Cough, Difficulty Breathing, Snoring and Wheezing. Breast Not Present- Breast Mass, Breast Pain, Nipple Discharge and Skin Changes. Cardiovascular Not Present- Chest Pain,  Difficulty Breathing Lying Down, Leg Cramps, Palpitations, Rapid Heart Rate, Shortness of Breath and Swelling  of Extremities. Gastrointestinal Not Present- Abdominal Pain, Bloating, Bloody Stool, Change in Bowel Habits, Chronic diarrhea, Constipation, Difficulty Swallowing, Excessive gas, Gets full quickly at meals, Hemorrhoids, Indigestion, Nausea, Rectal Pain and Vomiting. Female Genitourinary Not Present- Frequency, Nocturia, Painful Urination, Pelvic Pain and Urgency. Musculoskeletal Not Present- Back Pain, Joint Pain, Joint Stiffness, Muscle Pain, Muscle Weakness and Swelling of Extremities. Neurological Not Present- Decreased Memory, Fainting, Headaches, Numbness, Seizures, Tingling, Tremor, Trouble walking and Weakness. Psychiatric Not Present- Anxiety, Bipolar, Change in Sleep Pattern, Depression, Fearful and Frequent crying. Endocrine Not Present- Cold Intolerance, Excessive Hunger, Hair Changes, Heat Intolerance, Hot flashes and New Diabetes. Hematology Not Present- Easy Bruising, Excessive bleeding, Gland problems, HIV and Persistent Infections.  Vitals Claiborne Billings Dockery LPN; QA348G QA348G PM) 06/23/2015 12:23 PM Weight: 137.4 lb Height: 62.5in Body Surface Area: 1.64 m Body Mass Index: 24.73 kg/m  Temp.: 98.21F(Oral)  Pulse: 86 (Regular)  BP: 122/84 (Sitting, Left Arm, Standard)       Physical Exam Rachel Regal MD; 06/23/2015 12:38 PM) The physical exam findings are as follows: Note:Limited examination  Midline abdominal incision is well-healed. There is slight ecchymosis. There are a few small areas where the skin edges are not completely aligned. There is no drainage. There is no sign of infection. There is no sign of herniation.  Staples are removed and benzoin and Steri-Strips are applied.    Assessment & Plan Rachel Regal MD; 06/23/2015 12:40 PM) INTESTINAL ISCHEMIA (K55.9) Current Plans Follow up with [place] in the office in 1 month.  Call us sooner  as needed.  Patient is doing well following her surgical procedures. Staples are removed and benzoin and Steri-Strips are applied today. Patient may remove the Steri-Strips in 5-7 days.  Patient may shower now and daily as needed. Patient should not lift over 20 pounds for the next 3 weeks.  Patient may begin applying topical creams as instructed below once the Steri-Strips have come off.  Patient will return in 4 weeks for final wound check.  CARE OF INCISION  Apply cocoa butter with vitamin E cream (Palmer's brand) to your incision 2 - 3 times daily. Massage cream into incision for one minute with each application using small circular motions. Use sunscreen (50 SPF or higher) for the first 6 months after surgery if area is exposed to sun. You may alternate Mederma or other scar reducing cream with cocoa butter cream if desired. Do not use Mederma alone as it may cause drying of skin and itching. "Scar-Away" topical silicon dressings may be applied to wounds in conjunction with topical creams. These dressings are available on [employer] and from [employer].    Signed by Rachel Regal, MD (06/23/2015 12:40 PM)

## 2016-03-09 NOTE — Anesthesia Preprocedure Evaluation (Addendum)
Anesthesia Evaluation  Patient identified by MRN, date of birth, ID band Patient awake    Reviewed: Allergy & Precautions, NPO status , Patient's Chart, lab work & pertinent test results  History of Anesthesia Complications (+) PONV and history of anesthetic complications  Airway Mallampati: II  TM Distance: >3 FB Neck ROM: Full    Dental  (+) Teeth Intact, Dental Advisory Given   Pulmonary    Pulmonary exam normal breath sounds clear to auscultation       Cardiovascular hypertension, Pt. on home beta blockers + angina + CAD, + Cardiac Stents and + Peripheral Vascular Disease  (-) Past MI and (-) CHF Normal cardiovascular exam+ dysrhythmias (h/o A-fib, no longer on xarelto)  Rhythm:Regular Rate:Normal  Nuclear Stress Test 06/30/15: Nuclear stress EF: 56%. No wall motion abnormalities There was no ST segment deviation noted during stress. This is a low risk study. No ischemia identified.  EKG 06/21/15: Sinus rhythm with short PR and PACs, low voltage QRS.  2D echo 12/25/14: Study Conclusions - Left ventricle: The cavity size was normal. Wall thickness wasnormal. Systolic function was normal. The estimated ejectionfraction was in the range of 55% to 60%. Wall motion was normal;there were no regional wall motion abnormalities. There was noevidence of elevated ventricular filling pressure by Dopplerparameters. - Mitral valve: Calcified annulus. Impressions: - No cardiac source of emboli was identified.  Cardiac cath 12/24/14: Ost RCA lesion, 30% stenosed. Ost LM to LM lesion, 30% stenosed. Mid Cx lesion, 25% stenosed. Dist LAD lesion, 40% stenosed. Prox LAD lesion, 90% stenosed. Post intervention, there is a 0% residual stenosis. The left ventricular systolic function is normal. 1. Severe single vessel CAD with successful stenting of severe proximal LAD stenosis 2. Mild nonobstructive plaquing in the left main,  RCA, and left circumflex 3. Normal/vigorous LV systolic function DAPT with ASA and plavix at least 12 months   Neuro/Psych TIAnegative psych ROS   GI/Hepatic negative GI ROS, Neg liver ROS, small bowel obstruction s/p laparotomy 06/2015   Endo/Other  Hypothyroidism   Renal/GU negative Renal ROS     Musculoskeletal  (+) Arthritis , Osteoarthritis,    Abdominal   Peds  Hematology  (+) Blood dyscrasia, anemia ,   Anesthesia Other Findings Day of surgery medications reviewed with the patient.  Reproductive/Obstetrics                            Anesthesia Physical Anesthesia Plan  ASA: III  Anesthesia Plan: General   Post-op Pain Management: GA combined w/ Regional for post-op pain   Induction: Intravenous  Airway Management Planned: Oral ETT  Additional Equipment:   Intra-op Plan:   Post-operative Plan: Extubation in OR  Informed Consent: I have reviewed the patients History and Physical, chart, labs and discussed the procedure including the risks, benefits and alternatives for the proposed anesthesia with the patient or authorized representative who has indicated his/her understanding and acceptance.   Dental advisory given  Plan Discussed with: CRNA  Anesthesia Plan Comments: (Bilateral TAP blocks. TIVA.)       Anesthesia Quick Evaluation

## 2016-03-10 ENCOUNTER — Ambulatory Visit (HOSPITAL_COMMUNITY): Payer: Medicare HMO | Admitting: Vascular Surgery

## 2016-03-10 ENCOUNTER — Ambulatory Visit (HOSPITAL_COMMUNITY): Payer: Medicare HMO | Admitting: Anesthesiology

## 2016-03-10 ENCOUNTER — Encounter (HOSPITAL_COMMUNITY)
Admission: RE | Disposition: A | Discharge status: Home / Self Care | Payer: Self-pay | Source: Ambulatory Visit | Attending: Surgery

## 2016-03-10 ENCOUNTER — Observation Stay (HOSPITAL_COMMUNITY)
Admission: RE | Admit: 2016-03-10 | Discharge: 2016-03-12 | Disposition: A | Discharge status: Home / Self Care | Payer: Medicare HMO | Source: Ambulatory Visit | Attending: Surgery | Admitting: Surgery

## 2016-03-10 ENCOUNTER — Encounter (HOSPITAL_COMMUNITY): Payer: Self-pay | Admitting: *Deleted

## 2016-03-10 DIAGNOSIS — Z8673 Personal history of transient ischemic attack (TIA), and cerebral infarction without residual deficits: Secondary | ICD-10-CM | POA: Insufficient documentation

## 2016-03-10 DIAGNOSIS — Z955 Presence of coronary angioplasty implant and graft: Secondary | ICD-10-CM | POA: Diagnosis not present

## 2016-03-10 DIAGNOSIS — Z7982 Long term (current) use of aspirin: Secondary | ICD-10-CM | POA: Insufficient documentation

## 2016-03-10 DIAGNOSIS — K4020 Bilateral inguinal hernia, without obstruction or gangrene, not specified as recurrent: Principal | ICD-10-CM | POA: Insufficient documentation

## 2016-03-10 DIAGNOSIS — M1990 Unspecified osteoarthritis, unspecified site: Secondary | ICD-10-CM | POA: Diagnosis not present

## 2016-03-10 DIAGNOSIS — I2510 Atherosclerotic heart disease of native coronary artery without angina pectoris: Secondary | ICD-10-CM | POA: Insufficient documentation

## 2016-03-10 DIAGNOSIS — I10 Essential (primary) hypertension: Secondary | ICD-10-CM | POA: Diagnosis not present

## 2016-03-10 DIAGNOSIS — K432 Incisional hernia without obstruction or gangrene: Secondary | ICD-10-CM | POA: Insufficient documentation

## 2016-03-10 DIAGNOSIS — Z7901 Long term (current) use of anticoagulants: Secondary | ICD-10-CM | POA: Insufficient documentation

## 2016-03-10 DIAGNOSIS — K5909 Other constipation: Secondary | ICD-10-CM | POA: Insufficient documentation

## 2016-03-10 DIAGNOSIS — Z79899 Other long term (current) drug therapy: Secondary | ICD-10-CM | POA: Diagnosis not present

## 2016-03-10 DIAGNOSIS — Z7902 Long term (current) use of antithrombotics/antiplatelets: Secondary | ICD-10-CM | POA: Insufficient documentation

## 2016-03-10 DIAGNOSIS — E039 Hypothyroidism, unspecified: Secondary | ICD-10-CM | POA: Diagnosis not present

## 2016-03-10 DIAGNOSIS — G8918 Other acute postprocedural pain: Secondary | ICD-10-CM | POA: Diagnosis not present

## 2016-03-10 HISTORY — PX: INCISIONAL HERNIA REPAIR: SHX193

## 2016-03-10 HISTORY — PX: INSERTION OF MESH: SHX5868

## 2016-03-10 HISTORY — PX: INGUINAL HERNIA REPAIR: SHX194

## 2016-03-10 LAB — CBC
HCT: 36.4 % (ref 36.0–46.0)
Hemoglobin: 12.4 g/dL (ref 12.0–15.0)
MCH: 31.5 pg (ref 26.0–34.0)
MCHC: 34.1 g/dL (ref 30.0–36.0)
MCV: 92.4 fL (ref 78.0–100.0)
Platelets: 248 10*3/uL (ref 150–400)
RBC: 3.94 MIL/uL (ref 3.87–5.11)
RDW: 13.9 % (ref 11.5–15.5)
WBC: 9.7 10*3/uL (ref 4.0–10.5)

## 2016-03-10 LAB — CREATININE, SERUM
CREATININE: 0.46 mg/dL (ref 0.44–1.00)
GFR calc Af Amer: >60 mL/min (ref >60)
GFR calc non Af Amer: >60 mL/min (ref >60)

## 2016-03-10 SURGERY — REPAIR, HERNIA, INGUINAL, BILATERAL, ADULT
Anesthesia: General | Site: Groin

## 2016-03-10 MED ORDER — 0.9 % SODIUM CHLORIDE (POUR BTL) OPTIME
TOPICAL | Status: DC | PRN
  Administered 2016-03-10: 1000 mL

## 2016-03-10 MED ORDER — FENTANYL CITRATE (PF) 100 MCG/2ML IJ SOLN
25.0000 ug | INTRAMUSCULAR | Status: DC | PRN
  Administered 2016-03-10 (×2): 50 ug via INTRAVENOUS

## 2016-03-10 MED ORDER — FENTANYL CITRATE (PF) 100 MCG/2ML IJ SOLN
INTRAMUSCULAR | Status: AC
  Filled 2016-03-10: qty 2

## 2016-03-10 MED ORDER — BACITRACIN-NEOMYCIN-POLYMYXIN 400-5-5000 EX OINT
TOPICAL_OINTMENT | CUTANEOUS | Status: AC
  Filled 2016-03-10: qty 1

## 2016-03-10 MED ORDER — NITROGLYCERIN 0.4 MG SL SUBL: 0.4000 mg | SUBLINGUAL_TABLET | SUBLINGUAL | Status: DC | PRN

## 2016-03-10 MED ORDER — WHITE PETROLATUM GEL
Status: AC
  Administered 2016-03-10
  Filled 2016-03-10: qty 1

## 2016-03-10 MED ORDER — CHLORHEXIDINE GLUCONATE CLOTH 2 % EX PADS: 6.0000 | MEDICATED_PAD | Freq: Once | CUTANEOUS | Status: DC

## 2016-03-10 MED ORDER — ONDANSETRON 4 MG PO TBDP: 4.0000 mg | ORAL_TABLET | Freq: Four times a day (QID) | ORAL | Status: DC | PRN

## 2016-03-10 MED ORDER — DEXAMETHASONE SODIUM PHOSPHATE 10 MG/ML IJ SOLN
INTRAMUSCULAR | Status: DC | PRN
  Administered 2016-03-10: 10 mg via INTRAVENOUS

## 2016-03-10 MED ORDER — HYDROMORPHONE HCL 2 MG/ML IJ SOLN
1.0000 mg | INTRAMUSCULAR | Status: DC | PRN
  Administered 2016-03-10 – 2016-03-11 (×4): 1 mg via INTRAVENOUS
  Filled 2016-03-10 (×4): qty 1

## 2016-03-10 MED ORDER — PROPOFOL 10 MG/ML IV BOLUS
INTRAVENOUS | Status: DC | PRN
  Administered 2016-03-10: 120 mg via INTRAVENOUS
  Administered 2016-03-10 (×2): 30 mg via INTRAVENOUS

## 2016-03-10 MED ORDER — ENOXAPARIN SODIUM 40 MG/0.4ML ~~LOC~~ SOLN
40.0000 mg | SUBCUTANEOUS | Status: DC
  Administered 2016-03-11 – 2016-03-12 (×2): 40 mg via SUBCUTANEOUS
  Filled 2016-03-10 (×2): qty 0.4

## 2016-03-10 MED ORDER — CEFAZOLIN SODIUM-DEXTROSE 2-4 GM/100ML-% IV SOLN: 2.0000 g | INTRAVENOUS | Status: DC

## 2016-03-10 MED ORDER — SUGAMMADEX SODIUM 200 MG/2ML IV SOLN
INTRAVENOUS | Status: DC | PRN
  Administered 2016-03-10: 150 mg via INTRAVENOUS

## 2016-03-10 MED ORDER — LACTATED RINGERS IV SOLN
INTRAVENOUS | Status: DC | PRN
  Administered 2016-03-10: 07:00:00 via INTRAVENOUS

## 2016-03-10 MED ORDER — ONDANSETRON HCL 4 MG/2ML IJ SOLN: 4.0000 mg | Freq: Four times a day (QID) | INTRAMUSCULAR | Status: DC | PRN

## 2016-03-10 MED ORDER — ONDANSETRON HCL 4 MG/2ML IJ SOLN: 4.0000 mg | Freq: Once | INTRAMUSCULAR | Status: DC | PRN

## 2016-03-10 MED ORDER — BUPIVACAINE HCL (PF) 0.25 % IJ SOLN
INTRAMUSCULAR | Status: AC
  Filled 2016-03-10: qty 30

## 2016-03-10 MED ORDER — KCL IN DEXTROSE-NACL 20-5-0.45 MEQ/L-%-% IV SOLN
INTRAVENOUS | Status: DC
  Administered 2016-03-10 – 2016-03-11 (×3): via INTRAVENOUS
  Filled 2016-03-10 (×3): qty 1000

## 2016-03-10 MED ORDER — ONDANSETRON HCL 4 MG/2ML IJ SOLN
INTRAMUSCULAR | Status: DC | PRN
  Administered 2016-03-10 (×2): 4 mg via INTRAVENOUS

## 2016-03-10 MED ORDER — LEVOTHYROXINE SODIUM 75 MCG PO TABS
75.0000 ug | ORAL_TABLET | Freq: Every day | ORAL | Status: DC
  Administered 2016-03-11 – 2016-03-12 (×2): 75 ug via ORAL
  Filled 2016-03-10 (×2): qty 1

## 2016-03-10 MED ORDER — HYDROCODONE-ACETAMINOPHEN 5-325 MG PO TABS
1.0000 | ORAL_TABLET | ORAL | Status: DC | PRN
  Administered 2016-03-10 – 2016-03-12 (×9): 2 via ORAL
  Filled 2016-03-10 (×9): qty 2

## 2016-03-10 MED ORDER — CEFAZOLIN SODIUM-DEXTROSE 2-4 GM/100ML-% IV SOLN
2.0000 g | INTRAVENOUS | Status: AC
  Administered 2016-03-10: 2 g via INTRAVENOUS

## 2016-03-10 MED ORDER — SUGAMMADEX SODIUM 200 MG/2ML IV SOLN
INTRAVENOUS | Status: AC
  Filled 2016-03-10: qty 2

## 2016-03-10 MED ORDER — LIDOCAINE HCL (CARDIAC) 20 MG/ML IV SOLN
INTRAVENOUS | Status: DC | PRN
  Administered 2016-03-10: 30 mg via INTRAVENOUS

## 2016-03-10 MED ORDER — CARVEDILOL 25 MG PO TABS
25.0000 mg | ORAL_TABLET | Freq: Two times a day (BID) | ORAL | Status: DC
  Administered 2016-03-10 – 2016-03-12 (×4): 25 mg via ORAL
  Filled 2016-03-10 (×4): qty 1

## 2016-03-10 MED ORDER — PROPOFOL 500 MG/50ML IV EMUL
INTRAVENOUS | Status: DC | PRN
  Administered 2016-03-10: 100 ug/kg/min via INTRAVENOUS

## 2016-03-10 MED ORDER — BUPIVACAINE HCL 0.25 % IJ SOLN
INTRAMUSCULAR | Status: DC | PRN
  Administered 2016-03-10: 20 mL
  Administered 2016-03-10: 30 mL

## 2016-03-10 MED ORDER — ACETAMINOPHEN 10 MG/ML IV SOLN
INTRAVENOUS | Status: AC
  Filled 2016-03-10: qty 100

## 2016-03-10 MED ORDER — BUPIVACAINE-EPINEPHRINE (PF) 0.5% -1:200000 IJ SOLN
INTRAMUSCULAR | Status: DC | PRN
  Administered 2016-03-10: 30 mL via PERINEURAL

## 2016-03-10 MED ORDER — IBUPROFEN 600 MG PO TABS
600.0000 mg | ORAL_TABLET | Freq: Four times a day (QID) | ORAL | Status: DC | PRN
  Administered 2016-03-11 – 2016-03-12 (×2): 600 mg via ORAL
  Filled 2016-03-10 (×2): qty 1

## 2016-03-10 MED ORDER — PROPOFOL 10 MG/ML IV BOLUS
INTRAVENOUS | Status: AC
  Filled 2016-03-10: qty 20

## 2016-03-10 MED ORDER — ROCURONIUM BROMIDE 100 MG/10ML IV SOLN
INTRAVENOUS | Status: DC | PRN
  Administered 2016-03-10 (×2): 10 mg via INTRAVENOUS
  Administered 2016-03-10: 40 mg via INTRAVENOUS

## 2016-03-10 MED ORDER — ACETAMINOPHEN 10 MG/ML IV SOLN
INTRAVENOUS | Status: DC | PRN
  Administered 2016-03-10: 1000 mg via INTRAVENOUS

## 2016-03-10 MED ORDER — BUPIVACAINE HCL (PF) 0.25 % IJ SOLN
INTRAMUSCULAR | Status: AC
  Filled 2016-03-10: qty 60

## 2016-03-10 MED ORDER — FENTANYL CITRATE (PF) 100 MCG/2ML IJ SOLN
INTRAMUSCULAR | Status: DC | PRN
  Administered 2016-03-10 (×6): 50 ug via INTRAVENOUS

## 2016-03-10 SURGICAL SUPPLY — 52 items
ADH SKN CLS APL DERMABOND .7 (GAUZE/BANDAGES/DRESSINGS) ×4
BANDAGE ADH SHEER 1  50/CT (GAUZE/BANDAGES/DRESSINGS) ×1 IMPLANT
BLADE SURG ROTATE 9660 (MISCELLANEOUS) ×1 IMPLANT
CANISTER SUCTION 2500CC (MISCELLANEOUS) ×1 IMPLANT
CHLORAPREP W/TINT 26ML (MISCELLANEOUS) ×3 IMPLANT
COVER SURGICAL LIGHT HANDLE (MISCELLANEOUS) ×3 IMPLANT
DECANTER SPIKE VIAL GLASS SM (MISCELLANEOUS) ×4 IMPLANT
DERMABOND ADVANCED (GAUZE/BANDAGES/DRESSINGS) ×2
DERMABOND ADVANCED .7 DNX12 (GAUZE/BANDAGES/DRESSINGS) IMPLANT
DRAIN PENROSE 1/2X12 LTX STRL (WOUND CARE) IMPLANT
DRAPE LAPAROTOMY TRNSV 102X78 (DRAPE) ×3 IMPLANT
DRAPE UTILITY XL STRL (DRAPES) ×3 IMPLANT
ELECT CAUTERY BLADE 6.4 (BLADE) ×3 IMPLANT
ELECT REM PT RETURN 9FT ADLT (ELECTROSURGICAL) ×3
ELECTRODE REM PT RTRN 9FT ADLT (ELECTROSURGICAL) ×2 IMPLANT
GAUZE SPONGE 4X4 12PLY STRL (GAUZE/BANDAGES/DRESSINGS) ×2 IMPLANT
GLOVE BIO SURGEON STRL SZ8 (GLOVE) ×1 IMPLANT
GLOVE BIOGEL PI IND STRL 6.5 (GLOVE) IMPLANT
GLOVE BIOGEL PI IND STRL 8.5 (GLOVE) IMPLANT
GLOVE BIOGEL PI INDICATOR 6.5 (GLOVE) ×1
GLOVE BIOGEL PI INDICATOR 8.5 (GLOVE) ×2
GLOVE SURG ORTHO 8.0 STRL STRW (GLOVE) ×3 IMPLANT
GLOVE SURG SS PI 6.5 STRL IVOR (GLOVE) ×1 IMPLANT
GOWN STRL REUS W/ TWL LRG LVL3 (GOWN DISPOSABLE) ×2 IMPLANT
GOWN STRL REUS W/ TWL XL LVL3 (GOWN DISPOSABLE) ×2 IMPLANT
GOWN STRL REUS W/TWL LRG LVL3 (GOWN DISPOSABLE) ×6
GOWN STRL REUS W/TWL XL LVL3 (GOWN DISPOSABLE) ×9
KIT BASIN OR (CUSTOM PROCEDURE TRAY) ×3 IMPLANT
KIT ROOM TURNOVER OR (KITS) ×3 IMPLANT
MESH ULTRAPRO 3X6 7.6X15CM (Mesh General) ×2 IMPLANT
NDL HYPO 25GX1X1/2 BEV (NEEDLE) ×2 IMPLANT
NEEDLE HYPO 25GX1X1/2 BEV (NEEDLE) ×3 IMPLANT
NS IRRIG 1000ML POUR BTL (IV SOLUTION) ×3 IMPLANT
PACK SURGICAL SETUP 50X90 (CUSTOM PROCEDURE TRAY) ×3 IMPLANT
PAD ARMBOARD 7.5X6 YLW CONV (MISCELLANEOUS) ×3 IMPLANT
PENCIL BUTTON HOLSTER BLD 10FT (ELECTRODE) ×3 IMPLANT
SPECIMEN JAR SMALL (MISCELLANEOUS) IMPLANT
SPONGE LAP 18X18 X RAY DECT (DISPOSABLE) ×3 IMPLANT
STRIP CLOSURE SKIN 1/2X4 (GAUZE/BANDAGES/DRESSINGS) ×3 IMPLANT
SUT MNCRL AB 4-0 PS2 18 (SUTURE) ×8 IMPLANT
SUT NOVA NAB GS-21 0 18 T12 DT (SUTURE) ×4 IMPLANT
SUT NOVA NAB GS-22 2 0 T19 (SUTURE) ×12 IMPLANT
SUT SILK 2 0 SH (SUTURE) ×2 IMPLANT
SUT SILK 3 0 (SUTURE) ×3
SUT SILK 3-0 18XBRD TIE 12 (SUTURE) ×2 IMPLANT
SUT VIC AB 3-0 SH 18 (SUTURE) ×7 IMPLANT
SYR BULB 3OZ (MISCELLANEOUS) ×3 IMPLANT
SYR CONTROL 10ML LL (SYRINGE) ×3 IMPLANT
TOWEL OR 17X24 6PK STRL BLUE (TOWEL DISPOSABLE) ×3 IMPLANT
TOWEL OR 17X26 10 PK STRL BLUE (TOWEL DISPOSABLE) ×2 IMPLANT
TUBE CONNECTING 12X1/4 (SUCTIONS) ×1 IMPLANT
YANKAUER SUCT BULB TIP NO VENT (SUCTIONS) ×1 IMPLANT

## 2016-03-10 NOTE — Anesthesia Postprocedure Evaluation (Signed)
Anesthesia Post Note  Patient: Rachel Park  Procedure(s) Performed: Procedure(s) (LRB): BILATERAL INGUINAL HERNIA REPAIRS (Bilateral) INSERTION OF MESH TO BILATERAL GROINS AND ABDOMEN (N/A) INCISIONAL HERNIA REPAIR TIMES TWO (N/A)  Patient location during evaluation: PACU Anesthesia Type: General Level of consciousness: awake and alert Pain management: pain level controlled Vital Signs Assessment: post-procedure vital signs reviewed and stable Respiratory status: spontaneous breathing, nonlabored ventilation, respiratory function stable and patient connected to nasal cannula oxygen Cardiovascular status: blood pressure returned to baseline and stable Postop Assessment: no signs of nausea or vomiting Anesthetic complications: no       Last Vitals:  Vitals:   03/10/16 1027 03/10/16 1042  BP: (!) 163/77 (!) 151/80  Pulse: 60 61  Resp: 12 14  Temp:      Last Pain:  Vitals:   03/10/16 1037  TempSrc:   PainSc: [place]

## 2016-03-10 NOTE — Anesthesia Procedure Notes (Signed)
Anesthesia Regional Block:  TAP block  Pre-Anesthetic Checklist: ,, timeout performed, Correct Patient, Correct Site, Correct Laterality, Correct Procedure, Correct Position, site marked, Risks and benefits discussed,  Surgical consent,  Pre-op evaluation,  At surgeon's request and post-op pain management  Laterality: Left and Right  Prep: chloraprep       Needles:  Injection technique: Single-shot  Needle Type: Echogenic Stimulator Needle     Needle Length: 10cm 10 cm Needle Gauge: 21 and 21 G    Additional Needles:  Procedures: ultrasound guided (picture in chart) TAP block Narrative:  Start time: 03/10/2016 7:00 AM End time: 03/10/2016 7:10 AM Injection made incrementally with aspirations every 5 mL.  Performed by: Personally   Additional Notes: No pain on injection. No increased resistance to injection. Injection made in 5cc increments.  Good needle visualization.  Patient tolerated procedure well.  BILATERAL TAP BLOCKS

## 2016-03-10 NOTE — Interval H&P Note (Signed)
History and Physical Interval Note:  03/10/2016 6:52 AM  Rachel Park  has presented today for surgery, with the diagnosis of BILATERAL INGUINAL HERNIAS, INCISIONAL HERNIA.  The various methods of treatment have been discussed with the patient and family. After consideration of risks, benefits and other options for treatment, the patient has consented to    Procedure(s): BILATERAL INGUINAL HERNIA REPAIRS (Bilateral) INSERTION OF MESH (Bilateral) INCISIONAL HERNIA REPAIR (N/A) as a surgical intervention .    The patient's history has been reviewed, patient examined, no change in status, stable for surgery.  I have reviewed the patient's chart and labs.  Questions were answered to the patient's satisfaction.    Earnstine Regal, MD, [facility] Office: [place]

## 2016-03-10 NOTE — Brief Op Note (Signed)
03/10/2016  10:08 AM  PATIENT:  Rachel Park  83 y.o. female  PRE-OPERATIVE DIAGNOSIS:  BILATERAL INGUINAL HERNIAS, INCISIONAL HERNIA (2)  POST-OPERATIVE DIAGNOSIS:  BILATERAL INGUINAL HERNIAS, INCISIONAL HERNIA (2)  PROCEDURE:  Procedure(s): BILATERAL INGUINAL HERNIA REPAIRS (Bilateral) INSERTION OF MESH TO BILATERAL GROINS AND ABDOMEN (N/A) INCISIONAL HERNIA REPAIR TIMES TWO (N/A)  SURGEON:  Surgeon(s) and Role:    * Armandina Gemma, MD - Primary  ANESTHESIA:   general  EBL:  No intake/output data recorded.  BLOOD ADMINISTERED:none  DRAINS: none   LOCAL MEDICATIONS USED:  MARCAINE     SPECIMEN:  No Specimen  DISPOSITION OF SPECIMEN:  N/A  COUNTS:  YES  TOURNIQUET:  * No tourniquets in log *  DICTATION: .Other Dictation: Dictation Number [id number]  PLAN OF CARE: Admit for overnight observation  PATIENT DISPOSITION:  PACU - hemodynamically stable.   Delay start of Pharmacological VTE agent (>24hrs) due to surgical blood loss or risk of bleeding: yes  Earnstine Regal, MD, [facility] Office: [phone]

## 2016-03-10 NOTE — Transfer of Care (Signed)
Immediate Anesthesia Transfer of Care Note  Patient: Rachel Park  Procedure(s) Performed: Procedure(s): BILATERAL INGUINAL HERNIA REPAIRS (Bilateral) INSERTION OF MESH TO BILATERAL GROINS AND ABDOMEN (N/A) INCISIONAL HERNIA REPAIR TIMES TWO (N/A)  Patient Location: PACU  Anesthesia Type:General  Level of Consciousness: awake, alert , oriented and patient cooperative  Airway & Oxygen Therapy: Patient Spontanous Breathing and Patient connected to nasal cannula oxygen  Post-op Assessment: Report given to RN and Post -op Vital signs reviewed and stable  Post vital signs: Reviewed and stable  Last Vitals:  Vitals:   03/10/16 0554 03/10/16 1012  BP: (!) 145/75 (!) 151/76  Pulse: 61 (!) 59  Resp: 18 13  Temp: 36.6 C 36.7 C    Last Pain:  Vitals:   03/10/16 0554  TempSrc: Oral      Patients Stated Pain Goal: 2 ([id number])  Complications: No apparent anesthesia complications

## 2016-03-11 DIAGNOSIS — K4020 Bilateral inguinal hernia, without obstruction or gangrene, not specified as recurrent: Secondary | ICD-10-CM | POA: Diagnosis not present

## 2016-03-11 MED ORDER — POLYETHYLENE GLYCOL 3350 17 G PO PACK
17.0000 g | PACK | Freq: Every day | ORAL | Status: DC
  Administered 2016-03-11 – 2016-03-12 (×2): 17 g via ORAL
  Filled 2016-03-11: qty 1

## 2016-03-11 MED ORDER — DIPHENHYDRAMINE HCL 12.5 MG/5ML PO ELIX: 12.5000 mg | ORAL_SOLUTION | Freq: Four times a day (QID) | ORAL | Status: DC | PRN

## 2016-03-11 NOTE — Progress Notes (Signed)
Surgery:  Stable and alert.  Pleasant and cooperative. ate some solid food last night without nausea. Voiding without difficulty. Ambulating to bathroom.  Exam: Alert.  Mental status normal.  Minimal distress Abdomen soft.  Not distended.  Appropriately tender.  Faint erythema around all incisions.  No pruritus.  Question reaction to Dermabond.  Hypoactive bowel sounds.  Assessment/plan: POD #1.  Repair bilateral inguinal hernia and repair of ventral incisional hernia 2 Stable Advance diet and activities Dr. Harlow Asa plans to see her later today to decide whether to discharge home today or tomorrow.   Edsel Petrin. Dalbert Batman, M.D., [facility] General and Minimally invasive Surgery Breast and Colorectal Surgery Office:   [phone]

## 2016-03-11 NOTE — Op Note (Signed)
Rachel Park, LIENHARD NO.:  [id number]  MEDICAL RECORD NO.:  [id number]  LOCATION:  [facility]                         FACILITY:  [facility]  PHYSICIAN:  Earnstine Regal, MD      DATE OF BIRTH:  1931-05-25  DATE OF PROCEDURE:  03/10/2016                              OPERATIVE REPORT   PREOPERATIVE DIAGNOSES: 1. Bilateral inguinal hernia. 2. Ventral incisional hernia, inferior midline and     periumbilical.  POSTOPERATIVE DIAGNOSES: 1. Bilateral inguinal hernia. 2. Ventral incisional hernia, inferior midline and     periumbilical.  PROCEDURES: 1. Repair left inguinal hernia with Ethicon UltraPro mesh. 2. Repair right inguinal hernia with Ethicon UltraPro mesh. 3. Repair inferior midline incisional hernia with Bard Ventralex 6.4-     cm mesh patch. 4. Repair periumbilical incisional hernia with Bard Ventralex 4.3-cm     mesh patch.  SURGEON:  Earnstine Regal, MD, FACS  ANESTHESIA:  General.  ESTIMATED BLOOD LOSS:  Minimal.  PREPARATION:  ChloraPrep.  COMPLICATIONS:  None.  INDICATIONS:  The patient is an 83 year old female who had undergone emergent laparotomy for internal hernia and intestinal ischemia in March 2017 at an outside facility.  She required second-look laparotomy.  The patient had wound healing by secondary intention.  The patient presented for surgical followup to my practice.  She was noted to have bilateral inguinal hernias.  She was followed over the past 9 months.  She developed incisional hernias.  She now comes to the operating room for repair of bilateral inguinal hernias and repair of two defects in the midline abdominal wound.  BODY OF REPORT:  Procedure was done in OR #1 at the [facility]. [facility].  The patient was brought to the operating room, placed in supine position on the operating room table.  Following administration of general anesthesia, the patient was positioned and then prepped and draped in the usual aseptic  fashion.  After ascertaining that an adequate level of anesthesia had been achieved, a left inguinal incision was made with a #15 blade.  Dissection was carried through the subcutaneous tissues down to the fascia.  An obvious hernia bulge is identified at the external inguinal ring.  Hernia sac is dissected away from the surrounding structures.  External oblique fascia is incised in line with its fibers.  Hernia sac is completely dissected out and then reduced.  Inguinal ligament was dissected out and a plane is developed beneath the external oblique fascia.  Hernia is completely reduced.  Hernia defect is closed with interrupted 0 Novafil simple sutures.  The floor of the inguinal canal is then reinforced with a sheet of Ethicon UltraPro mesh.  Mesh is cut to the appropriate dimensions.  It is secured to the pubic tubercle and along the inguinal ligament with a running 2-0 Novafil suture.  Superior margin of the mesh is secured to the transversalis and internal oblique fascia with interrupted 2-0 Novafil sutures.  Local field block is placed with Marcaine.  External oblique fascia is closed with interrupted 3-0 Vicryl sutures.  Subcutaneous tissues are closed with interrupted 3-0 Vicryl sutures.  Skin is anesthetized with local anesthetic and then closed  with a running 4-0 Monocryl subcuticular suture.  Next, we turned our attention to the right inguinal hernia.  Again, a skin incision was made with a #15 blade.  Dissection was carried through the subcutaneous tissues and hemostasis achieved with the electrocautery.  Dissection was carried down to the fascia.  Again, a hernia is noted at the external inguinal ring.  Hernia sac is dissected away from the surrounding structures.  External oblique fascia is incised in line with its fibers and extended.  Hernia sac is dissected out of the inguinal canal down to the abdominal wall.  It was then reduced and the defect closed with interrupted  0 Novafil sutures.  The inguinal floor is dissected out allowing for placement of mesh.  A second sheet of Ethicon UltraPro mesh was brought on the field and cut to the appropriate dimensions.  It was secured to the pubic tubercle and along the inguinal ligament with a running 2-0 Novafil suture.  Superior margin of the mesh was secured to the transversalis and internal oblique fascia with interrupted 2-0 Novafil sutures.  Local field block was placed with Marcaine.  External oblique was closed with interrupted 3-0 Vicryl sutures.  Subcutaneous tissues were closed with interrupted 3-0 Vicryl sutures.  Skin was anesthetized with local anesthetic and then closed with a running 4-0 Monocryl subcuticular suture.  Next, we turned our attention to the inferior incisional hernia at the bottom of the midline incision.  The incision was reopened with a #10 blade.  Dissection was carried down through the subcutaneous tissues. Hernia sac was identified and opened.  It contained small bowel, which was reduced back within the peritoneal cavity.  Hernia sac is dissected out to its fascial margins and then the hernia sac is completely excised.  Fascial defect measures approximately 3 cm in size.  There was a moderate diastasis of the anterior abdominal wall with separation of the rectus musculature.  A medium-sized Bard Ventralex ST mesh patch measuring 6.4 cm is used.  It is prepared and inserted into the preperitoneal space.  It is secured to the abdominal wall with interrupted 0 Novafil sutures.  The hernia defect was then closed with interrupted 0 Novafil sutures.  Subcutaneous tissues were closed with interrupted 3-0 Vicryl sutures.  Skin was anesthetized with local anesthetic.  Skin edges were reapproximated with a running 4-0 Monocryl subcuticular suture.  Next, we addressed the incisional hernia at the level of the umbilicus. Again, the midline incision was opened with a #10 blade.  Dissection  was carried into the subcutaneous tissues and the umbilicus was completely elevated off the abdominal wall.  Hernia sac is identified.  It is opened.  Hernia sac is dissected down to the fascial margins and the hernia sac is excised.  Fascial defect measures 2 cm in size.  A small Bard Ventralex ST patch measuring 4.3 cm in diameter is selected.  It is inserted into the preperitoneal space.  It is secured to the abdominal wall with interrupted 0 Novafil sutures.  The incision is then closed with interrupted 0 Novafil sutures.  Local anesthetic is infiltrated. Subcutaneous tissues are closed with interrupted 3-0 Vicryl sutures. Skin is anesthetized with local anesthetic and then closed with a running 4-0 Monocryl subcuticular suture.  Wounds are washed and dried and Dermabond is applied to all wounds.  The patient is awakened from anesthesia and brought to the recovery room.  The patient tolerated the procedure well.   Earnstine Regal, MD, [facility] Office: [phone]    TMG/MEDQ  D:  03/10/2016  T:  03/11/2016  Job:  [id number]  cc:   Rosetta Posner, M.D.

## 2016-03-11 NOTE — Progress Notes (Signed)
Pt having good pain control with Vicodin.  Have PRN benadryl ordered if needed tonight for itching /sleep.  Pt ambulating in the hallway with good toleration.

## 2016-03-11 NOTE — Progress Notes (Signed)
  General Surgery [facility]  Assessment & Plan:  Status post bilat inguinal hernia repairs, two incisional hernia repairs - POD#1  Pain well controlled this AM  Voiding  Ambulated in hall  Continue to mobilize, encourage po intake  Begin Miralax po  Likely home tomorrow        Earnstine Regal, MD, [facility]       Office: [phone]    Subjective: Patient in bed, nurse at bedside.  No specific complaints other than discomfort  Objective: Vital signs in last 24 hours: Temp:  [98 F (36.7 C)-98.6 F (37 C)] 98.5 F (36.9 C) (01/06 0453) Pulse Rate:  [65-70] 69 (01/06 0453) Resp:  [13-19] 19 (01/06 0453) BP: (122-142)/(60-74) 127/60 (01/06 0453) SpO2:  [94 %-98 %] 98 % (01/06 0453) Last BM Date: 03/10/16  Intake/Output from previous day: 01/05 0701 - 01/06 0700 In: 2690 [P.O.:240; I.V.:2450] Out: 30 [Blood:30] Intake/Output this shift: Total I/O In: -  Out: 400 [Urine:400]  Physical Exam: HEENT - sclerae clear, mucous membranes moist Neck - soft Abdomen - mild distension; wounds dry and intact with Dermabond Ext - no edema, non-tender Neuro - alert & oriented, no focal deficits  Lab Results:   Recent Labs  03/10/16 1211  WBC 9.7  HGB 12.4  HCT 36.4  PLT 248   BMET  Recent Labs  03/10/16 1211  CREATININE 0.46   PT/INR No results for input(s): LABPROT, INR in the last 72 hours. Comprehensive Metabolic Panel:    Component Value Date/Time   NA 137 02/29/2016 0834   NA 136 06/30/2015 0848   K 4.2 02/29/2016 0834   K 4.8 06/30/2015 0848   CL 105 02/29/2016 0834   CL 104 06/30/2015 0848   CO2 25 02/29/2016 0834   CO2 24 06/30/2015 0848   BUN 12 02/29/2016 0834   BUN 12 06/30/2015 0848   CREATININE 0.46 03/10/2016 1211   CREATININE 0.47 02/29/2016 0834   CREATININE 0.46 (L) 06/30/2015 0848   CREATININE 0.47 (L) 03/31/2015 0735   GLUCOSE 112 (H) 02/29/2016 0834   GLUCOSE 94 06/30/2015 0848    CALCIUM 8.8 (L) 02/29/2016 0834   CALCIUM 8.7 06/30/2015 0848   AST 18 06/30/2015 0848   AST 45 (H) 03/31/2015 0735   ALT 16 06/30/2015 0848   ALT 47 (H) 03/31/2015 0735   ALKPHOS 53 06/30/2015 0848   ALKPHOS 94 03/31/2015 0735   BILITOT 0.4 06/30/2015 0848   BILITOT 0.7 03/31/2015 0735   PROT 5.9 (L) 06/30/2015 0848   PROT 6.4 03/31/2015 0735   ALBUMIN 3.5 (L) 06/30/2015 0848   ALBUMIN 3.9 03/31/2015 0735    Studies/Results: No results found.    Rachel Park M 03/11/2016  Patient ID: Rachel Park, female   DOB: January 01, 1932, 83 y.o.   MRN: [id number]

## 2016-03-12 DIAGNOSIS — K4020 Bilateral inguinal hernia, without obstruction or gangrene, not specified as recurrent: Secondary | ICD-10-CM | POA: Diagnosis not present

## 2016-03-12 MED ORDER — POLYETHYLENE GLYCOL 3350 17 G PO PACK: 17.0000 g | PACK | Freq: Every day | ORAL | 0 refills | Status: DC

## 2016-03-12 MED ORDER — POLYETHYLENE GLYCOL 3350 17 G PO PACK
17.0000 g | PACK | Freq: Once | ORAL | Status: DC
  Filled 2016-03-12: qty 1

## 2016-03-12 MED ORDER — HYDROCODONE-ACETAMINOPHEN 5-325 MG PO TABS: 1.0000 | ORAL_TABLET | ORAL | 0 refills | Status: DC | PRN

## 2016-03-12 NOTE — Progress Notes (Signed)
Pt discharged to home accomp by son.  DC instructions reviewed and binder placed.  Extra binder given to pt in case she needs it at home.  Rx for Vicodin given and explained.

## 2016-03-12 NOTE — Progress Notes (Signed)
2 Days Post-Op  Subjective: Doing well.  Tolerating diet.  Ambulating.  Voiding without difficulty No bowel movement.  Took MiraLAX last night.  Will repeat this morning No specific complaints.  Wants to go home  Objective: Vital signs in last 24 hours: Temp:  [97.7 F (36.5 C)-98.6 F (37 C)] 97.9 F (36.6 C) (01/07 0545) Pulse Rate:  [61-70] 61 (01/07 0545) Resp:  [17-19] 17 (01/07 0545) BP: (92-139)/(44-73) 139/67 (01/07 0545) SpO2:  [96 %-99 %] 99 % (01/07 0545) Last BM Date: 03/10/16  Intake/Output from previous day: 01/06 0701 - 01/07 0700 In: 1725 [P.O.:560; I.V.:1165] Out: 1000 [Urine:1000] Intake/Output this shift: Total I/O In: 1725 [P.O.:560; I.V.:1165] Out: 600 [Urine:600]  General appearance: Alert.  Pleasant.  No distress.  Mental status normal Resp: clear to auscultation bilaterally GI: Abdomen soft.  Midline incisions and groin incisions all look fine.  No erythema or rash.  Hypoactive bowel sounds. Extremities: no edema, redness or tenderness in the calves or thighs  Lab Results:  No results found for this or any previous visit (from the past 24 hour(s)).   Studies/Results: No results found.  . carvedilol  25 mg Oral BID WC  . enoxaparin (LOVENOX) injection  40 mg Subcutaneous Q24H  . levothyroxine  75 mcg Oral QAC breakfast  . polyethylene glycol  17 g Oral Daily  . polyethylene glycol  17 g Oral Once     Assessment/Plan: s/p Procedure(s): BILATERAL INGUINAL HERNIA REPAIRS INSERTION OF MESH TO BILATERAL GROINS AND ABDOMEN INCISIONAL HERNIA REPAIR TIMES TWO   POD #2. Repair bilateral inguinal hernias and repair ventral incisional hernias 2 Making good progress Continue ambulation We'll give another dose of MiraLAX this morning Probably can be discharged today.  We'll discuss with Dr. Delford Field.  @PROBHOSP @  LOS: 0 days    Neeta Storey M 03/12/2016  . .prob

## 2016-03-12 NOTE — Discharge Summary (Signed)
Physician Discharge Summary [facility]  Patient ID: Rachel Park MRN: [id number] DOB/AGE: 05/15/1931 83 y.o.  Admit date: 03/10/2016 Discharge date: 03/12/2016  Admission Diagnoses:  Bilateral inguinal herniae, multiple incisional herniae (2)  Discharge Diagnoses:  Principal Problem:   Bilateral inguinal hernia without obstruction or gangrene Active Problems:   Incisional hernia, without obstruction or gangrene   Bilateral inguinal hernia   Discharged Condition: good  Hospital Course: Patient was admitted for observation following hernia surgery.  Post op course was uncomplicated.  Pain was well controlled.  Tolerated diet.  Patient was prepared for discharge home on POD#2.  Consults: None  Treatments: surgery: open repair bilat inguinal herniae, repair incisional herniae  Discharge Exam: Blood pressure 139/67, pulse 61, temperature 97.9 F (36.6 C), temperature source Oral, resp. rate 17, weight 64.4 kg (142 lb), SpO2 99 %. HEENT - clear ABD - soft without distension; mild soft tissue swelling, no sign of infection or drainage  Disposition: Home  Discharge Instructions    Diet - low sodium heart healthy    Complete by:  As directed    Discharge instructions    Complete by:  As directed    [facility] [place]  Always review your discharge instruction sheet given to you by the facility where your surgery was performed.  A  prescription for pain medication may be given to you upon discharge.  Take your pain medication as prescribed.  If narcotic pain medicine is not needed, then you may take acetaminophen (Tylenol) or ibuprofen (Advil) as needed.  Take your usually prescribed medications unless otherwise directed.  If you need a refill on your pain medication, please contact your pharmacy.  They will contact our office to request authorization. Prescriptions will not be filled after 5 pm daily or on  weekends.  You should follow a light diet the first 24 hours after arrival home, such as soup and crackers or toast.  Be sure to include plenty of fluids daily.  Resume your normal diet the day after surgery.  Most patients will experience some swelling and bruising around the surgical site.  Ice packs and reclining will help.  Swelling and bruising can take several days to resolve.   It is common to experience some constipation if taking pain medication after surgery.  Increasing fluid intake and taking a stool softener (such as Colace) will usually help or prevent this problem from occurring.  A mild laxative (Milk of Magnesia or Miralax) should be taken according to package directions if there are no bowel movements after 48 hours.  Unless discharge instructions indicate otherwise, you may remove your bandages 24-48 hours after surgery, and you may shower at that time.  You will have steri-strips (small skin tapes) in place directly over the incision.  These strips should be left on the skin for 5-7 days.  Any sutures or staples will be removed at the office during your follow-up visit.  ACTIVITIES:  You may resume regular (light) daily activities beginning the next day - such as daily self-care, walking, climbing stairs - gradually increasing activities as tolerated.  You may have sexual intercourse when it is comfortable.  Refrain from any heavy lifting or straining until approved by your doctor.  You may drive when you are no longer taking prescription pain medication, you can comfortably wear a seatbelt, and you can safely maneuver your car and apply brakes.  You should see your doctor in the office for  a follow-up appointment approximately 2-3 weeks after your surgery.  Make sure that you call for this appointment within a day or two after you arrive home to insure a convenient appointment time.   WHEN TO CALL YOUR DOCTOR: Fever greater than 101.0 Inability to urinate Persistent nausea  and/or vomiting Extreme swelling or bruising Continued bleeding from incision Increased pain, redness, or drainage from the incision  The clinic staff is available to answer your questions during regular business hours.  Please don't hesitate to call and ask to speak to one of the nurses for clinical concerns.  If you have a medical emergency, go to the nearest emergency room or call 911.  A surgeon from [facility] is always on call for the hospital.   [facility] [address], [place], [place], [place]  [address]  [phone] ? [phone] ? FAX [phone]  [email or web address]   Increase activity slowly    Complete by:  As directed    No dressing needed    Complete by:  As directed      Allergies as of 03/12/2016   No Known Allergies     Medication List    TAKE these medications   aspirin EC 81 MG tablet Take 1 tablet (81 mg total) by mouth daily.   atorvastatin 20 MG tablet Commonly known as:  LIPITOR Take 1 tablet (20 mg total) by mouth daily. What changed:  when to take this   CALCIUM 600 + D PO Take 1 tablet by mouth daily at 12 noon.   carvedilol 25 MG tablet Commonly known as:  COREG Take 25 mg by mouth 2 (two) times daily.   cholecalciferol 1000 units tablet Commonly known as:  VITAMIN D Take 2,000 Units by mouth daily at 12 noon.   HYDROcodone-acetaminophen 5-325 MG tablet Commonly known as:  NORCO/VICODIN Take 1-2 tablets by mouth every 4 (four) hours as needed for moderate pain.   ibuprofen 200 MG tablet Commonly known as:  ADVIL,MOTRIN Take 400 mg by mouth every 8 (eight) hours as needed (for arthritic pain in hands).   levothyroxine 75 MCG tablet Commonly known as:  SYNTHROID, LEVOTHROID Take 75 mcg by mouth daily before breakfast.   NITROSTAT 0.4 MG SL tablet Generic drug:  nitroGLYCERIN Place 1 tablet (0.4 mg total) under the tongue every 5 (five) minutes as needed. Chest pain    polyethylene glycol packet Commonly known as:  MIRALAX / GLYCOLAX Take 17 g by mouth daily. Start taking on:  03/13/2016   SYSTANE ULTRA 0.4-0.3 % Soln Generic drug:  Polyethyl Glycol-Propyl Glycol Place 1-2 drops into both eyes 3 (three) times daily as needed (dry eyes).      Follow-up Information    Elizabethann Lackey M, MD. Schedule an appointment as soon as possible for a visit in 10 day(s).   Specialty:  General Surgery Contact information: [address] [place] [address] [phone]           Earnstine Regal, MD, [facility] Office: [phone]   Signed: Earnstine Regal 03/12/2016, 10:34 AM

## 2016-03-13 ENCOUNTER — Encounter (HOSPITAL_COMMUNITY): Payer: Self-pay | Admitting: Surgery

## 2016-04-21 ENCOUNTER — Encounter: Payer: Self-pay | Admitting: Cardiovascular Disease

## 2016-04-21 ENCOUNTER — Ambulatory Visit (INDEPENDENT_AMBULATORY_CARE_PROVIDER_SITE_OTHER): Payer: Medicare HMO | Admitting: Cardiovascular Disease

## 2016-04-21 VITALS — BP 140/80 | HR 65 | Ht 62.0 in | Wt 136.0 lb

## 2016-04-21 DIAGNOSIS — E785 Hyperlipidemia, unspecified: Secondary | ICD-10-CM

## 2016-04-21 DIAGNOSIS — I2510 Atherosclerotic heart disease of native coronary artery without angina pectoris: Secondary | ICD-10-CM

## 2016-04-21 DIAGNOSIS — Z9861 Coronary angioplasty status: Secondary | ICD-10-CM

## 2016-04-21 MED ORDER — ROSUVASTATIN CALCIUM 5 MG PO TABS: 5.0000 mg | ORAL_TABLET | Freq: Every day | ORAL | 3 refills | Status: DC

## 2016-04-21 NOTE — Patient Instructions (Signed)
Medication Instructions:  Your physician has recommended you make the following change in your medication:  1. Discontinue Atorvastatin 2. Start Rosuvastatin (5 mg ) daily   Labwork: Your physician recommends that you return for a FASTING lipid profile: in 3 months.   Testing/Procedures: -None  Follow-Up: Your physician wants you to follow-up in: 6 month with Dr. Acie Fredrickson.  You will receive a reminder letter in the mail two months in advance. If you don't receive a letter, please call our office to schedule the follow-up appointment.   Any Other Special Instructions Will Be Listed Below (If Applicable).     If you need a refill on your cardiac medications before your next appointment, please call your pharmacy.

## 2016-04-21 NOTE — Progress Notes (Signed)
Cardiology Office Note   Date:  04/21/2016   ID:  Rachel Park, DOB 05/14/1931, MRN [id number]  PCP:  Mathews Argyle, MD  Cardiologist:   Mertie Moores, MD   Chief Complaint  Patient presents with  . 6 month f/u    problem list 1. Coronary artery  disease-status post stenting of her proximal LAD 2.  CVA- occurred during the stenting   History of Present Illness: Rachel Park is a 83 y.o. female who presents for follow up of her stenting She has done well.  Is a bit anxious - may account for her elevated BP today .  Memory has improved ,  No residual CVA symptoms.   Feb. 1, 2017: Doing well Has some fatigue. Has some bruising on lower logs  -  On Plavix   June 21, 2015:  Rachel Park was diagnosed with atrial fib, was started on eliquis 5 mg BID.  She was in [place] with her sister. Developed abdominal pain .   Severe hypoxemia .   In great distress. Was found to have a small bowell perforation .   Was in the ICU for 2 days.   Developed hypotension , was started on levophed,   Developed atrial fib  plavix was stopped,   Heparin was started.   Has converted to NSR at this time  Has had some band like chest pain since that time Has shortness of breath - at rest and with exertion .     September 22, 2015:  Rachel Park is doing well.  We DC'd her Eliquis - due to bleeding.   She is on Plavix  Her only episode of a-fib was in the setting of a small bowel obstruction   Is very fatigued.   Was put on Iron tablet by Dr. Felipa Eth. Hb is still low  [place] also increased her Coreg to 25 BID .  BP readings at home have been well controlled.  Has not had any syncope Goes for a walk every day and is fatigued when she is done with the walk.   No CP .  Has occasional chest tightness .  Same symptoms back in April - [name] was normal in April, 2017   Feb. 16, 2018:  Rachel Park is seen today . Has had surgery to repair 4 hernias.   Cant get her energy  Its only been a month -  I reminded her that it takes time to heal after surgery .  No CP or dyspnea.   Past Medical History:  Diagnosis Date  . Anginal pain ([place])   . Arthritis   . Complication of anesthesia   . Coronary artery disease   . Dyspnea    exertion  . Hypertension   . Hypothyroidism   . PONV (postoperative nausea and vomiting)   . Small bowel obstruction 06/2015  . Stroke [facility])    oct 2016  . TIA (transient ischemic attack) 03/24/2015  . Vertigo, benign positional    to be evaluated, intermittent    Past Surgical History:  Procedure Laterality Date  . ABDOMINAL HYSTERECTOMY    . CARDIAC CATHETERIZATION N/A 12/24/2014   Procedure: Left Heart Cath and Coronary Angiography;  Surgeon: Sherren Mocha, MD;  Location: [place] [facility];  Service: Cardiovascular;  Laterality: N/A;  . CATARACT EXTRACTION Bilateral   . COLONOSCOPY WITH PROPOFOL N/A 12/01/2013   Procedure: COLONOSCOPY WITH PROPOFOL;  Surgeon: Garlan Fair, MD;  [facility];  Service: Endoscopy;  Laterality: N/A;  . INCISIONAL  HERNIA REPAIR N/A 03/10/2016   Procedure: INCISIONAL HERNIA REPAIR TIMES TWO;  Surgeon: Armandina Gemma, MD;  Location: [place];  Service: General;  Laterality: N/A;  . INGUINAL HERNIA REPAIR Bilateral 03/10/2016   Procedure: BILATERAL INGUINAL HERNIA REPAIRS;  Surgeon: Armandina Gemma, MD;  Location: [place];  Service: General;  Laterality: Bilateral;  . INSERTION OF MESH N/A 03/10/2016   Procedure: INSERTION OF MESH TO BILATERAL GROINS AND ABDOMEN;  Surgeon: Armandina Gemma, MD;  Location: [place];  Service: General;  Laterality: N/A;  . laparotomy  06/2015  . TONSILLECTOMY       Current Outpatient Prescriptions  Medication Sig Dispense Refill  . aspirin EC 81 MG tablet Take 1 tablet (81 mg total) by mouth daily.    Marland Kitchen atorvastatin (LIPITOR) 20 MG tablet Take 1 tablet (20 mg total) by mouth daily. 90 tablet 3  . Calcium Carb-Cholecalciferol (CALCIUM 600 + D PO) Take 1 tablet by mouth daily at 12 noon.     .  carvedilol (COREG) 25 MG tablet Take 25 mg by mouth 2 (two) times daily.    . cholecalciferol (VITAMIN D) 1000 UNITS tablet Take 2,000 Units by mouth daily at 12 noon.     Marland Kitchen ibuprofen (ADVIL,MOTRIN) 200 MG tablet Take 400 mg by mouth every 8 (eight) hours as needed (for arthritic pain in hands).    Marland Kitchen levothyroxine (SYNTHROID, LEVOTHROID) 75 MCG tablet Take 75 mcg by mouth daily before breakfast.    . NITROSTAT 0.4 MG SL tablet Place 1 tablet (0.4 mg total) under the tongue every 5 (five) minutes as needed. Chest pain 25 tablet 6  . Polyethyl Glycol-Propyl Glycol (SYSTANE ULTRA) 0.4-0.3 % SOLN Place 1-2 drops into both eyes 3 (three) times daily as needed (dry eyes).    . polyethylene glycol (MIRALAX / GLYCOLAX) packet Take 17 g by mouth daily. 14 each 0   No current facility-administered medications for this visit.     Allergies:   Patient has no known allergies.    Social History:  The patient  reports that she has never smoked. She has never used smokeless tobacco. She reports that she does not drink alcohol or use drugs.   Family History:  The patient's family history includes Cancer in her sister; Heart attack in her father; Stroke in her mother.    ROS:  Please see the history of present illness.    Review of Systems: Constitutional:  denies fever, chills, diaphoresis, appetite change and fatigue.  HEENT: denies photophobia, eye pain, redness, hearing loss, ear pain, congestion, sore throat, rhinorrhea, sneezing, neck pain, neck stiffness and tinnitus.  Respiratory: denies SOB, DOE, cough, chest tightness, and wheezing.  Cardiovascular: denies chest pain, palpitations and leg swelling.  Gastrointestinal: denies nausea, vomiting, abdominal pain, diarrhea, constipation, blood in stool.  Genitourinary: denies dysuria, urgency, frequency, hematuria, flank pain and difficulty urinating.  Musculoskeletal: denies  myalgias, back pain, joint swelling, arthralgias and gait problem.   Skin:  denies pallor, rash and wound.  Neurological: denies dizziness, seizures, syncope, weakness, light-headedness, numbness and headaches.   Hematological: denies adenopathy, easy bruising, personal or family bleeding history.  Psychiatric/ Behavioral: denies suicidal ideation, mood changes, confusion, nervousness, sleep disturbance and agitation.       All other systems are reviewed and negative.    PHYSICAL EXAM: VS:  BP 140/80   Pulse 65   Ht 5\' 2"  (1.575 m)   Wt 136 lb (61.7 kg)   SpO2 97%   BMI 24.87 kg/m  ,  BMI Body mass index is 24.87 kg/m. GEN: Well nourished, well developed, in no acute distress  HEENT: normal  Neck: no JVD, carotid bruits, or masses Cardiac: RRR; no murmurs, rubs, or gallops,no edema  Respiratory:  clear to auscultation bilaterally, normal work of breathing GI: soft, nontender, nondistended, + BS MS: no deformity or atrophy  Skin: warm and dry, no rash Neuro:  Strength and sensation are intact Psych: normal   EKG:  EKG is ordered today. Sinus brady at 56.    ,    No ST or T wave changes.   Recent Labs: 06/30/2015: ALT 16 02/29/2016: BUN 12; Potassium 4.2; Sodium 137 03/10/2016: Creatinine, Ser 0.46; Hemoglobin 12.4; Platelets 248    Lipid Panel    Component Value Date/Time   CHOL 152 06/30/2015 0848   TRIG 70 06/30/2015 0848   HDL 65 06/30/2015 0848   CHOLHDL 2.3 06/30/2015 0848   VLDL 14 06/30/2015 0848   LDLCALC 73 06/30/2015 0848      Wt Readings from Last 3 Encounters:  04/21/16 136 lb (61.7 kg)  03/10/16 142 lb (64.4 kg)  02/29/16 142 lb (64.4 kg)      Other studies Reviewed: Additional studies/ records that were reviewed today include: . Review of the above records demonstrates:    ASSESSMENT AND PLAN:  1.  CAD - doing well after PCI of her LAD .  She's not having any additional symptoms. We performed a Myoview study in April which was normal..  2. Paroxysmal atrial fib - CHADS2VASC schore of 4-6  ( female, age > 95,  vascular disease,   TIA and a CVA ( as a post cath complicatino )   [place] was found have atrial fibrillation in the setting of a small bowel perforation and hypertension. She was on several pressors. The atrial fibrillation has resolved. She was started on Eliquis but has had some problems with GI bleeding and anemia. We have stopped the Eliquis since she is not had any recurrent atrial fibrillation.  She should be up to stop her Plavix in September.   She's now converted to normal sinus rhythm.   3. Hyperlipidemia:   She has occasional muscle aches.  w  Current medicines are reviewed at length with the patient today.  The patient does not have concerns regarding medicines.  The following changes have been made:  no change  Labs/ tests ordered today include:  No orders of the defined types were placed in this encounter.   Disposition:   FU with me in 6 months      Mertie Moores, MD  04/21/2016 9:31 AM    [facility] [place], [place], [place]  [address] Phone: [phone]; Fax: [phone]

## 2016-04-24 DIAGNOSIS — R69 Illness, unspecified: Secondary | ICD-10-CM | POA: Diagnosis not present

## 2016-04-26 DIAGNOSIS — Z803 Family history of malignant neoplasm of breast: Secondary | ICD-10-CM | POA: Diagnosis not present

## 2016-04-26 DIAGNOSIS — Z1231 Encounter for screening mammogram for malignant neoplasm of breast: Secondary | ICD-10-CM | POA: Diagnosis not present

## 2016-05-01 DIAGNOSIS — E038 Other specified hypothyroidism: Secondary | ICD-10-CM | POA: Diagnosis not present

## 2016-05-23 DIAGNOSIS — Z8701 Personal history of pneumonia (recurrent): Secondary | ICD-10-CM | POA: Diagnosis not present

## 2016-05-23 DIAGNOSIS — I480 Paroxysmal atrial fibrillation: Secondary | ICD-10-CM | POA: Diagnosis not present

## 2016-05-23 DIAGNOSIS — I4892 Unspecified atrial flutter: Secondary | ICD-10-CM | POA: Diagnosis not present

## 2016-05-23 DIAGNOSIS — Z0000 Encounter for general adult medical examination without abnormal findings: Secondary | ICD-10-CM | POA: Diagnosis not present

## 2016-05-23 DIAGNOSIS — M1612 Unilateral primary osteoarthritis, left hip: Secondary | ICD-10-CM | POA: Diagnosis not present

## 2016-05-23 DIAGNOSIS — E7800 Pure hypercholesterolemia, unspecified: Secondary | ICD-10-CM | POA: Diagnosis not present

## 2016-05-23 DIAGNOSIS — I10 Essential (primary) hypertension: Secondary | ICD-10-CM | POA: Diagnosis not present

## 2016-05-23 DIAGNOSIS — Z7982 Long term (current) use of aspirin: Secondary | ICD-10-CM | POA: Diagnosis not present

## 2016-05-23 DIAGNOSIS — E039 Hypothyroidism, unspecified: Secondary | ICD-10-CM | POA: Diagnosis not present

## 2016-05-23 DIAGNOSIS — Z79899 Other long term (current) drug therapy: Secondary | ICD-10-CM | POA: Diagnosis not present

## 2016-07-19 ENCOUNTER — Other Ambulatory Visit: Payer: Medicare HMO | Admitting: *Deleted

## 2016-07-19 DIAGNOSIS — Z9861 Coronary angioplasty status: Secondary | ICD-10-CM | POA: Diagnosis not present

## 2016-07-19 DIAGNOSIS — E785 Hyperlipidemia, unspecified: Secondary | ICD-10-CM | POA: Diagnosis not present

## 2016-07-19 DIAGNOSIS — I2510 Atherosclerotic heart disease of native coronary artery without angina pectoris: Secondary | ICD-10-CM | POA: Diagnosis not present

## 2016-07-19 LAB — LIPID PANEL
CHOLESTEROL TOTAL: 175 mg/dL (ref 100–199)
Chol/HDL Ratio: 2.1 ratio (ref 0.0–4.4)
HDL: 84 mg/dL (ref >39)
LDL Calculated: 80 mg/dL (ref 0–99)
Triglycerides: 56 mg/dL (ref 0–149)
VLDL CHOLESTEROL CAL: 11 mg/dL (ref 5–40)

## 2016-07-19 LAB — BASIC METABOLIC PANEL
BUN / CREAT RATIO: 19 (ref 12–28)
BUN: 11 mg/dL (ref 8–27)
CHLORIDE: 97 mmol/L (ref 96–106)
CO2: 24 mmol/L (ref 18–29)
Calcium: 9.4 mg/dL (ref 8.7–10.3)
Creatinine, Ser: 0.57 mg/dL (ref 0.57–1.00)
GFR, EST AFRICAN AMERICAN: 100 mL/min/{1.73_m2} (ref >59)
GFR, EST NON AFRICAN AMERICAN: 87 mL/min/{1.73_m2} (ref >59)
GLUCOSE: 92 mg/dL (ref 65–99)
POTASSIUM: 4.8 mmol/L (ref 3.5–5.2)
Sodium: 136 mmol/L (ref 134–144)

## 2016-07-19 LAB — HEPATIC FUNCTION PANEL
ALT: 23 IU/L (ref 0–32)
AST: 30 IU/L (ref 0–40)
Albumin: 4.4 g/dL (ref 3.5–4.7)
Alkaline Phosphatase: 75 IU/L (ref 39–117)
Bilirubin Total: 0.7 mg/dL (ref 0.0–1.2)
Bilirubin, Direct: 0.19 mg/dL (ref 0.00–0.40)
Total Protein: 6.5 g/dL (ref 6.0–8.5)

## 2016-08-01 DIAGNOSIS — R350 Frequency of micturition: Secondary | ICD-10-CM | POA: Diagnosis not present

## 2016-08-01 DIAGNOSIS — E7800 Pure hypercholesterolemia, unspecified: Secondary | ICD-10-CM | POA: Diagnosis not present

## 2016-08-01 DIAGNOSIS — M358 Other specified systemic involvement of connective tissue: Secondary | ICD-10-CM | POA: Diagnosis not present

## 2016-08-01 DIAGNOSIS — M7062 Trochanteric bursitis, left hip: Secondary | ICD-10-CM | POA: Diagnosis not present

## 2016-08-01 DIAGNOSIS — E039 Hypothyroidism, unspecified: Secondary | ICD-10-CM | POA: Diagnosis not present

## 2016-08-01 DIAGNOSIS — Z0000 Encounter for general adult medical examination without abnormal findings: Secondary | ICD-10-CM | POA: Diagnosis not present

## 2016-08-01 DIAGNOSIS — M8580 Other specified disorders of bone density and structure, unspecified site: Secondary | ICD-10-CM | POA: Diagnosis not present

## 2016-08-01 DIAGNOSIS — Z1389 Encounter for screening for other disorder: Secondary | ICD-10-CM | POA: Diagnosis not present

## 2016-08-01 DIAGNOSIS — I10 Essential (primary) hypertension: Secondary | ICD-10-CM | POA: Diagnosis not present

## 2016-08-01 DIAGNOSIS — Z79899 Other long term (current) drug therapy: Secondary | ICD-10-CM | POA: Diagnosis not present

## 2016-08-09 DIAGNOSIS — M7062 Trochanteric bursitis, left hip: Secondary | ICD-10-CM | POA: Diagnosis not present

## 2016-08-10 DIAGNOSIS — M25552 Pain in left hip: Secondary | ICD-10-CM | POA: Diagnosis not present

## 2016-08-15 DIAGNOSIS — M25552 Pain in left hip: Secondary | ICD-10-CM | POA: Diagnosis not present

## 2016-08-17 DIAGNOSIS — M25552 Pain in left hip: Secondary | ICD-10-CM | POA: Diagnosis not present

## 2016-08-22 DIAGNOSIS — M25552 Pain in left hip: Secondary | ICD-10-CM | POA: Diagnosis not present

## 2016-08-25 DIAGNOSIS — M25552 Pain in left hip: Secondary | ICD-10-CM | POA: Diagnosis not present

## 2016-09-11 DIAGNOSIS — M25552 Pain in left hip: Secondary | ICD-10-CM | POA: Diagnosis not present

## 2016-09-18 DIAGNOSIS — M25552 Pain in left hip: Secondary | ICD-10-CM | POA: Diagnosis not present

## 2016-09-19 ENCOUNTER — Encounter: Payer: Self-pay | Admitting: Physician Assistant

## 2016-09-19 NOTE — Progress Notes (Signed)
Cardiology Office Note    Date:  09/20/2016   ID:  Rachel Park, DOB [birth year], MRN [id number]  PCP:  Lajean Manes, MD  Cardiologist:  Dr. Acie Fredrickson  Chief Complaint: chest pain   History of Present Illness:   Rachel Park is a 83 y.o. female CAD s/p DES to LAD, CVA while stenting, HTN, PAF, HLD and hypothyroidism presents for chest pain.   She was admitted to [facility] on 12/24/2014 for evaluation of exertional angina. Troponin's were negative. Cath  revealed high grade LAD which was treated with an LAD DES. Post procedure she developed some speech difficulty and neurology was consulted. Work up suggested left subinsular cortex infarct embolic most likely a result status post PCI. She improved over the next 48 hrs.  Hx of  atrial fibrillation in the setting of a small bowel perforation and hypertension. She was on several pressors. The atrial fibrillation has resolved. She was started on Eliquis but has had some problems with GI bleeding and anemia. Stopped the Eliquis since she is not had any recurrent atrial fibrillation.  She was doing well on cardiac stand point when last seen by Dr. Acie Fredrickson 04/21/16.  Patient is presented for evaluation of chest tightness and shortness of breath for the past one month. This has progressively gotten worse. Initially it was exertional with climbing uphill. Now it is present at rest as well. She had a worse episode 3 days ago while attending church. No radiation. She tried sublingual nitroglycerin x 1 in 2 different times without improvement. Her symptoms is similar to prior angina when she had a stent placement in 2016 except radiation. She is also complains of fatigue and tired since been on Coreg. Heart rate in 50s. She is also dealing with right hip bursitis and sciatic pain.  Past Medical History:  Diagnosis Date  . Anginal pain ([place])   . Arthritis   . Complication of anesthesia   . Coronary artery disease   . Dyspnea    exertion  . Hypertension   .  Hypothyroidism   . PAF (paroxysmal atrial fibrillation) ([place])   . PONV (postoperative nausea and vomiting)   . Small bowel obstruction ([place]) 06/2015  . Stroke [facility])    oct 2016  . TIA (transient ischemic attack) 03/24/2015  . Vertigo, benign positional    to be evaluated, intermittent    Past Surgical History:  Procedure Laterality Date  . ABDOMINAL HYSTERECTOMY    . CARDIAC CATHETERIZATION N/A 12/24/2014   Procedure: Left Heart Cath and Coronary Angiography;  Surgeon: Sherren Mocha, MD;  Location: [place] [facility];  Service: Cardiovascular;  Laterality: N/A;  . CATARACT EXTRACTION Bilateral   . COLONOSCOPY WITH PROPOFOL N/A 12/01/2013   Procedure: COLONOSCOPY WITH PROPOFOL;  Surgeon: Garlan Fair, MD;  [facility];  Service: Endoscopy;  Laterality: N/A;  . INCISIONAL HERNIA REPAIR N/A 03/10/2016   Procedure: INCISIONAL HERNIA REPAIR TIMES TWO;  Surgeon: Armandina Gemma, MD;  Location: [place];  Service: General;  Laterality: N/A;  . INGUINAL HERNIA REPAIR Bilateral 03/10/2016   Procedure: BILATERAL INGUINAL HERNIA REPAIRS;  Surgeon: Armandina Gemma, MD;  Location: [place];  Service: General;  Laterality: Bilateral;  . INSERTION OF MESH N/A 03/10/2016   Procedure: INSERTION OF MESH TO BILATERAL GROINS AND ABDOMEN;  Surgeon: Armandina Gemma, MD;  Location: [place];  Service: General;  Laterality: N/A;  . laparotomy  06/2015  . TONSILLECTOMY      Current Medications: Prior to Admission medications   Medication  Sig Start Date End Date Taking? Authorizing Provider  aspirin EC 81 MG tablet Take 1 tablet (81 mg total) by mouth daily. 12/14/15   Nahser, Wonda Cheng, MD  Calcium Carb-Cholecalciferol (CALCIUM 600 + D PO) Take 1 tablet by mouth daily at 12 noon.     [provider]  carvedilol (COREG) 25 MG tablet Take 25 mg by mouth 2 (two) times daily. 08/28/15   [provider]  cholecalciferol (VITAMIN D) 1000 UNITS tablet Take 2,000 Units by mouth daily at 12 noon.     [provider]  ibuprofen (ADVIL,MOTRIN) 200 MG tablet Take 400 mg by mouth every 8 (eight) hours as needed (for arthritic pain in hands).    [provider]  levothyroxine (SYNTHROID, LEVOTHROID) 75 MCG tablet Take 75 mcg by mouth daily before breakfast.    [provider]  NITROSTAT 0.4 MG SL tablet Place 1 tablet (0.4 mg total) under the tongue every 5 (five) minutes as needed. Chest pain 09/22/15   Nahser, Wonda Cheng, MD  Polyethyl Glycol-Propyl Glycol (SYSTANE ULTRA) 0.4-0.3 % SOLN Place 1-2 drops into both eyes 3 (three) times daily as needed (dry eyes).    [provider]  polyethylene glycol (MIRALAX / GLYCOLAX) packet Take 17 g by mouth daily. 03/13/16   Armandina Gemma, MD  rosuvastatin (CRESTOR) 5 MG tablet Take 1 tablet (5 mg total) by mouth daily. 04/21/16   Nahser, Wonda Cheng, MD    Allergies:   Patient has no known allergies.   Social History   Social History  . Marital status: Divorced    Spouse name: N/A  . Number of children: 2  . Years of education: HS   Occupational History  . retired    Social History Main Topics  . Smoking status: Never Smoker  . Smokeless tobacco: Never Used  . Alcohol use No     Comment: occ.  . Drug use: No  . Sexual activity: Not Asked   Other Topics Concern  . None   Social History Narrative   Patient drinks 3-4 cups of caffeine daily.   Patient is right handed.      Family History:  The patient's family history includes Cancer in her sister; Heart attack in her father; Stroke in her mother.   ROS:   Please see the history of present illness.    ROS All other systems reviewed and are negative.   PHYSICAL EXAM:   VS:  BP (!) 150/76   Pulse (!) 51   Ht 5' 2.5" (1.588 m)   Wt 136 lb (61.7 kg)   BMI 24.48 kg/m    GEN: Well nourished, well developed, in no acute distress  HEENT: normal  Neck: no JVD, carotid bruits, or masses Cardiac: RRR; no murmurs, rubs, or gallops,no edema  Respiratory:  clear to  auscultation bilaterally, normal work of breathing GI: soft, nontender, nondistended, + BS MS: no deformity or atrophy  Skin: warm and dry, no rash Neuro:  Alert and Oriented x 3, Strength and sensation are intact Psych: euthymic mood, full affect  Wt Readings from Last 3 Encounters:  09/20/16 136 lb (61.7 kg)  04/21/16 136 lb (61.7 kg)  03/10/16 142 lb (64.4 kg)      Studies/Labs Reviewed:   EKG:  EKG is ordered today.  The ekg ordered today demonstrates Sinus bradycardia at rate of 51 bpm.   Recent Labs: 03/10/2016: Hemoglobin 12.4; Platelets 248 07/19/2016: ALT 23; BUN 11; Creatinine, Ser 0.57; Potassium 4.8;  Sodium 136   Lipid Panel    Component Value Date/Time   CHOL 175 07/19/2016 0829   TRIG 56 07/19/2016 0829   HDL 84 07/19/2016 0829   CHOLHDL 2.1 07/19/2016 0829   CHOLHDL 2.3 06/30/2015 0848   VLDL 14 06/30/2015 0848   LDLCALC 80 07/19/2016 0829    Additional studies/ records that were reviewed today include:  Myoview 06/30/2015  Nuclear stress EF: 56%. No wall motion abnormalities  There was no ST segment deviation noted during stress.  This is a low risk study. No ischemia identified.   Echocardiogram: 12/2014 Study Conclusions  - Left ventricle: The cavity size was normal. Wall thickness was   normal. Systolic function was normal. The estimated ejection   fraction was in the range of 55% to 60%. Wall motion was normal;   there were no regional wall motion abnormalities. There was no   evidence of elevated ventricular filling pressure by Doppler   parameters. - Mitral valve: Calcified annulus.  Impressions:  - No cardiac source of emboli was indentified.  Left Heart Cath and Coronary Angiography 12/2014  Conclusion    Ost RCA lesion, 30% stenosed.  Ost LM to LM lesion, 30% stenosed.  Mid Cx lesion, 25% stenosed.  Dist LAD lesion, 40% stenosed.  Prox LAD lesion, 90% stenosed. Post intervention, there is a 0% residual stenosis.  The  left ventricular systolic function is normal.   1. Severe single vessel CAD with successful stenting of severe proximal LAD stenosis 2. Mild nonobstructive plaquing in the left main, RCA, and left circumflex 3. Normal/vigorous LV systolic function  DAPT with ASA and plavix at least 12 months   Myoivew 06/2015  Nuclear stress EF: 56%. No wall motion abnormalities  There was no ST segment deviation noted during stress.  This is a low risk study. No ischemia identified.   ASSESSMENT & PLAN:   1. Unstable angina with hx of CAD s/p DES to LAD in 12/2014 - Symptoms is similar to prior angina. Initially symptomatic with exertion, now present at rest as well. She had a normal stress test 06/2015. Discussed medical management versus invasive evaluation. Patient prefers cardiac catheterization for further definitive evaluation as symptoms similar to prior angina. Will proceed. Continue aspirin, beta blocker and statin.  2. PAF in setting of small bowel perforation - previously on Eliquis. Discontinued due to GI bleeding and anemia. Maintaining sinus rhythm.  3. HLD - 07/19/2016: Cholesterol, Total 175; HDL 84; LDL Calculated 80; Triglycerides 56  - Continue statin.  4. HTN - Recently elevated at home as well. Today 150/76. Add Amlodipine 5mg  qd.   5. Sinus bradycardia - Heart rate is 50s on Coreg 25 mg twice a day. She is complaining of fatigue and tiredness since being on this medication. Might consider cut back to 12.5 mg twice a day during follow-up pending blood pressure readings.   Medication Adjustments/Labs and Tests Ordered: Current medicines are reviewed at length with the patient today.  Concerns regarding medicines are outlined above.  Medication changes, Labs and Tests ordered today are listed in the Patient Instructions below. Patient Instructions  Medication Instructions:    START TAKING AMLODIPINE 5 MG ONCE A DAY   If you need a refill on your cardiac medications before  your next appointment, please call your pharmacy.  Labwork: BMET CBC  AND PT/INR    Testing/Procedures: SEE LETTER FOR L HEART CATH ON 09-21-16 WITH DR END   Follow-Up: ONE WEEK POST CATH FOLLOW UP WITH VIN  AFTER 09-21-16    Any Other Special Instructions Will Be Listed Below (If Applicable).                                                                                                                                                       Jarrett Soho, [place]  09/20/2016 10:55 AM    [facility] [place], [place], [place]  [address] Phone: [phone]; Fax: [phone]

## 2016-09-20 ENCOUNTER — Encounter: Payer: Self-pay | Admitting: Physician Assistant

## 2016-09-20 ENCOUNTER — Ambulatory Visit (INDEPENDENT_AMBULATORY_CARE_PROVIDER_SITE_OTHER): Payer: Medicare HMO | Admitting: Physician Assistant

## 2016-09-20 ENCOUNTER — Encounter: Payer: Self-pay | Admitting: *Deleted

## 2016-09-20 VITALS — BP 150/76 | HR 51 | Ht 62.5 in | Wt 136.0 lb

## 2016-09-20 DIAGNOSIS — I480 Paroxysmal atrial fibrillation: Secondary | ICD-10-CM | POA: Diagnosis not present

## 2016-09-20 DIAGNOSIS — I200 Unstable angina: Secondary | ICD-10-CM | POA: Diagnosis not present

## 2016-09-20 DIAGNOSIS — I10 Essential (primary) hypertension: Secondary | ICD-10-CM | POA: Diagnosis not present

## 2016-09-20 DIAGNOSIS — R001 Bradycardia, unspecified: Secondary | ICD-10-CM | POA: Diagnosis not present

## 2016-09-20 DIAGNOSIS — I25110 Atherosclerotic heart disease of native coronary artery with unstable angina pectoris: Secondary | ICD-10-CM | POA: Diagnosis not present

## 2016-09-20 DIAGNOSIS — E785 Hyperlipidemia, unspecified: Secondary | ICD-10-CM | POA: Diagnosis not present

## 2016-09-20 LAB — BASIC METABOLIC PANEL
BUN/Creatinine Ratio: 26 (ref 12–28)
BUN: 16 mg/dL (ref 8–27)
CALCIUM: 9.8 mg/dL (ref 8.7–10.3)
CHLORIDE: 96 mmol/L (ref 96–106)
CO2: 28 mmol/L (ref 20–29)
Creatinine, Ser: 0.61 mg/dL (ref 0.57–1.00)
GFR calc Af Amer: 98 mL/min/{1.73_m2} (ref >59)
GFR, EST NON AFRICAN AMERICAN: 85 mL/min/{1.73_m2} (ref >59)
GLUCOSE: 99 mg/dL (ref 65–99)
POTASSIUM: 5.3 mmol/L — AB (ref 3.5–5.2)
SODIUM: 133 mmol/L — AB (ref 134–144)

## 2016-09-20 LAB — CBC
Hematocrit: 38.5 % (ref 34.0–46.6)
Hemoglobin: 13.4 g/dL (ref 11.1–15.9)
MCH: 32.1 pg (ref 26.6–33.0)
MCHC: 34.8 g/dL (ref 31.5–35.7)
MCV: 92 fL (ref 79–97)
PLATELETS: 263 10*3/uL (ref 150–379)
RBC: 4.18 x10E6/uL (ref 3.77–5.28)
RDW: 14.2 % (ref 12.3–15.4)
WBC: 9.2 10*3/uL (ref 3.4–10.8)

## 2016-09-20 LAB — PROTIME-INR
INR: 0.9 (ref 0.8–1.2)
PROTHROMBIN TIME: 9.5 s (ref 9.1–12.0)

## 2016-09-20 MED ORDER — AMLODIPINE BESYLATE 5 MG PO TABS: 5.0000 mg | ORAL_TABLET | Freq: Every day | ORAL | 0 refills | Status: DC

## 2016-09-20 MED ORDER — AMLODIPINE BESYLATE 5 MG PO TABS: 5.0000 mg | ORAL_TABLET | Freq: Every day | ORAL | 3 refills | Status: DC

## 2016-09-20 NOTE — Patient Instructions (Signed)
Medication Instructions:    START TAKING AMLODIPINE 5 MG ONCE A DAY   If you need a refill on your cardiac medications before your next appointment, please call your pharmacy.  Labwork: BMET CBC  AND PT/INR    Testing/Procedures: SEE LETTER FOR L HEART CATH ON 09-21-16 WITH DR END   Follow-Up: ONE WEEK POST CATH FOLLOW UP WITH VIN AFTER 09-21-16    Any Other Special Instructions Will Be Listed Below (If Applicable).

## 2016-09-25 ENCOUNTER — Telehealth: Payer: Self-pay

## 2016-09-25 NOTE — Telephone Encounter (Signed)
Patient contacted pre-catheterization at [facility] scheduled for: 09/27/2016 @ 1430 Verified arrival time and place:  NT @ 1230 Confirmed AM meds to be taken pre-cath with sip of water:  Pt to take ASA prior to arrival Confirmed patient has responsible person to drive home post procedure and observe patient for 24 hours: daughter in law  Addl concerns:  Pt has h/o stroke after previous PCI

## 2016-09-27 ENCOUNTER — Ambulatory Visit (HOSPITAL_COMMUNITY)
Admission: RE | Admit: 2016-09-27 | Discharge: 2016-09-27 | Disposition: A | Discharge status: Home / Self Care | Payer: Medicare HMO | Source: Ambulatory Visit | Attending: Internal Medicine | Admitting: Internal Medicine

## 2016-09-27 ENCOUNTER — Encounter (HOSPITAL_COMMUNITY)
Admission: RE | Disposition: A | Discharge status: Home / Self Care | Payer: Self-pay | Source: Ambulatory Visit | Attending: Internal Medicine

## 2016-09-27 DIAGNOSIS — I25118 Atherosclerotic heart disease of native coronary artery with other forms of angina pectoris: Secondary | ICD-10-CM | POA: Diagnosis not present

## 2016-09-27 DIAGNOSIS — I25110 Atherosclerotic heart disease of native coronary artery with unstable angina pectoris: Secondary | ICD-10-CM | POA: Diagnosis present

## 2016-09-27 DIAGNOSIS — Z8673 Personal history of transient ischemic attack (TIA), and cerebral infarction without residual deficits: Secondary | ICD-10-CM | POA: Insufficient documentation

## 2016-09-27 DIAGNOSIS — E039 Hypothyroidism, unspecified: Secondary | ICD-10-CM | POA: Insufficient documentation

## 2016-09-27 DIAGNOSIS — Z955 Presence of coronary angioplasty implant and graft: Secondary | ICD-10-CM | POA: Diagnosis not present

## 2016-09-27 DIAGNOSIS — R001 Bradycardia, unspecified: Secondary | ICD-10-CM | POA: Insufficient documentation

## 2016-09-27 DIAGNOSIS — E785 Hyperlipidemia, unspecified: Secondary | ICD-10-CM | POA: Diagnosis not present

## 2016-09-27 DIAGNOSIS — I480 Paroxysmal atrial fibrillation: Secondary | ICD-10-CM | POA: Insufficient documentation

## 2016-09-27 DIAGNOSIS — Z7982 Long term (current) use of aspirin: Secondary | ICD-10-CM | POA: Insufficient documentation

## 2016-09-27 DIAGNOSIS — I10 Essential (primary) hypertension: Secondary | ICD-10-CM | POA: Insufficient documentation

## 2016-09-27 HISTORY — PX: LEFT HEART CATH AND CORONARY ANGIOGRAPHY: CATH118249

## 2016-09-27 SURGERY — LEFT HEART CATH AND CORONARY ANGIOGRAPHY
Anesthesia: LOCAL

## 2016-09-27 MED ORDER — ASPIRIN 81 MG PO CHEW
81.0000 mg | CHEWABLE_TABLET | ORAL | Status: AC
  Administered 2016-09-27: 81 mg via ORAL

## 2016-09-27 MED ORDER — LIDOCAINE HCL (PF) 1 % IJ SOLN
INTRAMUSCULAR | Status: DC | PRN
  Administered 2016-09-27: 15 mL

## 2016-09-27 MED ORDER — ONDANSETRON HCL 4 MG/2ML IJ SOLN: 4.0000 mg | Freq: Four times a day (QID) | INTRAMUSCULAR | Status: DC | PRN

## 2016-09-27 MED ORDER — SODIUM CHLORIDE 0.9% FLUSH: 3.0000 mL | INTRAVENOUS | Status: DC | PRN

## 2016-09-27 MED ORDER — SODIUM CHLORIDE 0.9 % WEIGHT BASED INFUSION
3.0000 mL/kg/h | INTRAVENOUS | Status: DC
  Administered 2016-09-27: 3 mL/kg/h via INTRAVENOUS

## 2016-09-27 MED ORDER — SODIUM CHLORIDE 0.9% FLUSH: 3.0000 mL | Freq: Two times a day (BID) | INTRAVENOUS | Status: DC

## 2016-09-27 MED ORDER — ASPIRIN 81 MG PO CHEW
CHEWABLE_TABLET | ORAL | Status: AC
  Administered 2016-09-27: 81 mg via ORAL
  Filled 2016-09-27: qty 1

## 2016-09-27 MED ORDER — SODIUM CHLORIDE 0.9 % IV SOLN: 250.0000 mL | INTRAVENOUS | Status: DC | PRN

## 2016-09-27 MED ORDER — FENTANYL CITRATE (PF) 100 MCG/2ML IJ SOLN
INTRAMUSCULAR | Status: AC
  Filled 2016-09-27: qty 2

## 2016-09-27 MED ORDER — HEPARIN (PORCINE) IN NACL 2-0.9 UNIT/ML-% IJ SOLN
INTRAMUSCULAR | Status: AC
  Filled 2016-09-27: qty 1000

## 2016-09-27 MED ORDER — ACETAMINOPHEN 325 MG PO TABS: 650.0000 mg | ORAL_TABLET | ORAL | Status: DC | PRN

## 2016-09-27 MED ORDER — MIDAZOLAM HCL 2 MG/2ML IJ SOLN
INTRAMUSCULAR | Status: DC | PRN
  Administered 2016-09-27: 1 mg via INTRAVENOUS

## 2016-09-27 MED ORDER — LIDOCAINE HCL (PF) 1 % IJ SOLN
INTRAMUSCULAR | Status: AC
  Filled 2016-09-27: qty 30

## 2016-09-27 MED ORDER — HEPARIN (PORCINE) IN NACL 2-0.9 UNIT/ML-% IJ SOLN
INTRAMUSCULAR | Status: AC | PRN
  Administered 2016-09-27: 1000 mL

## 2016-09-27 MED ORDER — SODIUM CHLORIDE 0.9 % WEIGHT BASED INFUSION: 1.0000 mL/kg/h | INTRAVENOUS | Status: DC

## 2016-09-27 MED ORDER — FENTANYL CITRATE (PF) 100 MCG/2ML IJ SOLN
INTRAMUSCULAR | Status: DC | PRN
  Administered 2016-09-27: 25 ug via INTRAVENOUS

## 2016-09-27 MED ORDER — IOPAMIDOL (ISOVUE-370) INJECTION 76%
INTRAVENOUS | Status: DC | PRN
  Administered 2016-09-27: 80 mL via INTRA_ARTERIAL

## 2016-09-27 MED ORDER — MIDAZOLAM HCL 2 MG/2ML IJ SOLN
INTRAMUSCULAR | Status: AC
  Filled 2016-09-27: qty 2

## 2016-09-27 SURGICAL SUPPLY — 10 items
CATH INFINITI 5FR MULTPACK ANG (CATHETERS) ×1 IMPLANT
COVER PRB 48X5XTLSCP FOLD TPE (BAG) IMPLANT
COVER PROBE 5X48 (BAG) ×2
KIT HEART LEFT (KITS) ×2 IMPLANT
PACK CARDIAC CATHETERIZATION (CUSTOM PROCEDURE TRAY) ×2 IMPLANT
SHEATH PINNACLE 5F 10CM (SHEATH) ×1 IMPLANT
SYR MEDRAD MARK V 150ML (SYRINGE) ×2 IMPLANT
TRANSDUCER W/STOPCOCK (MISCELLANEOUS) ×2 IMPLANT
TUBING CIL FLEX 10 FLL-RA (TUBING) ×2 IMPLANT
WIRE EMERALD 3MM-J .035X150CM (WIRE) ×1 IMPLANT

## 2016-09-27 NOTE — Discharge Instructions (Signed)
Angiogram, Care After °This sheet gives you information about how to care for yourself after your procedure. Your health care provider may also give you more specific instructions. If you have problems or questions, contact your health care provider. °What can I expect after the procedure? °After the procedure, it is common to have bruising and tenderness at the catheter insertion area. °Follow these instructions at home: °Insertion site care  °· Follow instructions from your health care provider about how to take care of your insertion site. Make sure you: °¨ Wash your hands with soap and water before you change your bandage (dressing). If soap and water are not available, use hand sanitizer. °¨ Change your dressing as told by your health care provider. °¨ Leave stitches (sutures), skin glue, or adhesive strips in place. These skin closures may need to stay in place for 2 weeks or longer. If adhesive strip edges start to loosen and curl up, you may trim the loose edges. Do not remove adhesive strips completely unless your health care provider tells you to do that. °· Do not take baths, swim, or use a hot tub until your health care provider approves. °· You may shower 24-48 hours after the procedure or as told by your health care provider. °¨ Gently wash the site with plain soap and water. °¨ Pat the area dry with a clean towel. °¨ Do not rub the site. This may cause bleeding. °· Do not apply powder or lotion to the site. Keep the site clean and dry. °· Check your insertion site every day for signs of infection. Check for: °¨ Redness, swelling, or pain. °¨ Fluid or blood. °¨ Warmth. °¨ Pus or a bad smell. °Activity  °· Rest as told by your health care provider, usually for 1-2 days. °· Do not lift anything that is heavier than 10 lbs. (4.5 kg) or as told by your health care provider. °· Do not drive for 24 hours if you were given a medicine to help you relax (sedative). °· Do not drive or use heavy machinery while  taking prescription pain medicine. °General instructions  °· Return to your normal activities as told by your health care provider, usually in about a week. Ask your health care provider what activities are safe for you. °· If the catheter site starts bleeding, lie flat and put pressure on the site. If the bleeding does not stop, get help right away. This is a medical emergency. °· Drink enough fluid to keep your urine clear or pale yellow. This helps flush the contrast dye from your body. °· Take over-the-counter and prescription medicines only as told by your health care provider. °· Keep all follow-up visits as told by your health care provider. This is important. °Contact a health care provider if: °· You have a fever or chills. °· You have redness, swelling, or pain around your insertion site. °· You have fluid or blood coming from your insertion site. °· The insertion site feels warm to the touch. °· You have pus or a bad smell coming from your insertion site. °· You have bruising around the insertion site. °· You notice blood collecting in the tissue around the catheter site (hematoma). The hematoma may be painful to the touch. °Get help right away if: °· You have severe pain at the catheter insertion area. °· The catheter insertion area swells very fast. °· The catheter insertion area is bleeding, and the bleeding does not stop when you hold steady pressure on   the area. °· The area near or just beyond the catheter insertion site becomes pale, cool, tingly, or numb. °These symptoms may represent a serious problem that is an emergency. Do not wait to see if the symptoms will go away. Get medical help right away. Call your local emergency services (911 in the [place] Do not drive yourself to the hospital. °Summary °· After the procedure, it is common to have bruising and tenderness at the catheter insertion area. °· After the procedure, it is important to rest and drink plenty of fluids. °· Do not take baths,  swim, or use a hot tub until your health care provider says it is okay to do so. You may shower 24-48 hours after the procedure or as told by your health care provider. °· If the catheter site starts bleeding, lie flat and put pressure on the site. If the bleeding does not stop, get help right away. This is a medical emergency. °This information is not intended to replace advice given to you by your health care provider. Make sure you discuss any questions you have with your health care provider. °Document Released: 09/08/2004 Document Revised: 01/26/2016 Document Reviewed: 01/26/2016 °Elsevier Interactive Patient Education © 2017 [employer] ° °

## 2016-09-27 NOTE — H&P (View-Only) (Signed)
Cardiology Office Note    Date:  09/20/2016   ID:  Rachel Park, DOB 1931-04-16, MRN [id number]  PCP:  Lajean Manes, MD  Cardiologist:  Dr. Acie Fredrickson  Chief Complaint: chest pain   History of Present Illness:   Rachel Park is a 83 y.o. female CAD s/p DES to LAD, CVA while stenting, HTN, PAF, HLD and hypothyroidism presents for chest pain.   She was admitted to [facility] on 12/24/2014 for evaluation of exertional angina. Troponin's were negative. Cath  revealed high grade LAD which was treated with an LAD DES. Post procedure she developed some speech difficulty and neurology was consulted. Work up suggested left subinsular cortex infarct embolic most likely a result status post PCI. She improved over the next 48 hrs.  Hx of  atrial fibrillation in the setting of a small bowel perforation and hypertension. She was on several pressors. The atrial fibrillation has resolved. She was started on Eliquis but has had some problems with GI bleeding and anemia. Stopped the Eliquis since she is not had any recurrent atrial fibrillation.  She was doing well on cardiac stand point when last seen by Dr. Acie Fredrickson 04/21/16.  Patient is presented for evaluation of chest tightness and shortness of breath for the past one month. This has progressively gotten worse. Initially it was exertional with climbing uphill. Now it is present at rest as well. She had a worse episode 3 days ago while attending church. No radiation. She tried sublingual nitroglycerin x 1 in 2 different times without improvement. Her symptoms is similar to prior angina when she had a stent placement in 2016 except radiation. She is also complains of fatigue and tired since been on Coreg. Heart rate in 50s. She is also dealing with right hip bursitis and sciatic pain.  Past Medical History:  Diagnosis Date  . Anginal pain ([place])   . Arthritis   . Complication of anesthesia   . Coronary artery disease   . Dyspnea    exertion  . Hypertension   .  Hypothyroidism   . PAF (paroxysmal atrial fibrillation) ([place])   . PONV (postoperative nausea and vomiting)   . Small bowel obstruction ([place]) 06/2015  . Stroke [facility])    oct 2016  . TIA (transient ischemic attack) 03/24/2015  . Vertigo, benign positional    to be evaluated, intermittent    Past Surgical History:  Procedure Laterality Date  . ABDOMINAL HYSTERECTOMY    . CARDIAC CATHETERIZATION N/A 12/24/2014   Procedure: Left Heart Cath and Coronary Angiography;  Surgeon: Sherren Mocha, MD;  Location: [place] CV LAB;  Service: Cardiovascular;  Laterality: N/A;  . CATARACT EXTRACTION Bilateral   . COLONOSCOPY WITH PROPOFOL N/A 12/01/2013   Procedure: COLONOSCOPY WITH PROPOFOL;  Surgeon: Garlan Fair, MD;  Location: [facility];  Service: Endoscopy;  Laterality: N/A;  . INCISIONAL HERNIA REPAIR N/A 03/10/2016   Procedure: INCISIONAL HERNIA REPAIR TIMES TWO;  Surgeon: Armandina Gemma, MD;  Location: [place];  Service: General;  Laterality: N/A;  . INGUINAL HERNIA REPAIR Bilateral 03/10/2016   Procedure: BILATERAL INGUINAL HERNIA REPAIRS;  Surgeon: Armandina Gemma, MD;  Location: [place];  Service: General;  Laterality: Bilateral;  . INSERTION OF MESH N/A 03/10/2016   Procedure: INSERTION OF MESH TO BILATERAL GROINS AND ABDOMEN;  Surgeon: Armandina Gemma, MD;  Location: [place];  Service: General;  Laterality: N/A;  . laparotomy  06/2015  . TONSILLECTOMY      Current Medications: Prior to Admission medications   Medication  Sig Start Date End Date Taking? Authorizing Provider  aspirin EC 81 MG tablet Take 1 tablet (81 mg total) by mouth daily. 12/14/15   Nahser, Wonda Cheng, MD  Calcium Carb-Cholecalciferol (CALCIUM 600 + D PO) Take 1 tablet by mouth daily at 12 noon.     [provider]  carvedilol (COREG) 25 MG tablet Take 25 mg by mouth 2 (two) times daily. 08/28/15   [provider]  cholecalciferol (VITAMIN D) 1000 UNITS tablet Take 2,000 Units by mouth daily at 12 noon.     [provider]  ibuprofen (ADVIL,MOTRIN) 200 MG tablet Take 400 mg by mouth every 8 (eight) hours as needed (for arthritic pain in hands).    [provider]  levothyroxine (SYNTHROID, LEVOTHROID) 75 MCG tablet Take 75 mcg by mouth daily before breakfast.    [provider]  NITROSTAT 0.4 MG SL tablet Place 1 tablet (0.4 mg total) under the tongue every 5 (five) minutes as needed. Chest pain 09/22/15   Nahser, Wonda Cheng, MD  Polyethyl Glycol-Propyl Glycol (SYSTANE ULTRA) 0.4-0.3 % SOLN Place 1-2 drops into both eyes 3 (three) times daily as needed (dry eyes).    [provider]  polyethylene glycol (MIRALAX / GLYCOLAX) packet Take 17 g by mouth daily. 03/13/16   Armandina Gemma, MD  rosuvastatin (CRESTOR) 5 MG tablet Take 1 tablet (5 mg total) by mouth daily. 04/21/16   Nahser, Wonda Cheng, MD    Allergies:   Patient has no known allergies.   Social History   Social History  . Marital status: Divorced    Spouse name: N/A  . Number of children: 2  . Years of education: HS   Occupational History  . retired    Social History Main Topics  . Smoking status: Never Smoker  . Smokeless tobacco: Never Used  . Alcohol use No     Comment: occ.  . Drug use: No  . Sexual activity: Not Asked   Other Topics Concern  . None   Social History Narrative   Patient drinks 3-4 cups of caffeine daily.   Patient is right handed.      Family History:  The patient's family history includes Cancer in her sister; Heart attack in her father; Stroke in her mother.   ROS:   Please see the history of present illness.    ROS All other systems reviewed and are negative.   PHYSICAL EXAM:   VS:  BP (!) 150/76   Pulse (!) 51   Ht 5' 2.5" (1.588 m)   Wt 136 lb (61.7 kg)   BMI 24.48 kg/m    GEN: Well nourished, well developed, in no acute distress  HEENT: normal  Neck: no JVD, carotid bruits, or masses Cardiac: RRR; no murmurs, rubs, or gallops,no edema  Respiratory:  clear to  auscultation bilaterally, normal work of breathing GI: soft, nontender, nondistended, + BS MS: no deformity or atrophy  Skin: warm and dry, no rash Neuro:  Alert and Oriented x 3, Strength and sensation are intact Psych: euthymic mood, full affect  Wt Readings from Last 3 Encounters:  09/20/16 136 lb (61.7 kg)  04/21/16 136 lb (61.7 kg)  03/10/16 142 lb (64.4 kg)      Studies/Labs Reviewed:   EKG:  EKG is ordered today.  The ekg ordered today demonstrates Sinus bradycardia at rate of 51 bpm.   Recent Labs: 03/10/2016: Hemoglobin 12.4; Platelets 248 07/19/2016: ALT 23; BUN 11; Creatinine, Ser 0.57; Potassium 4.8;  Sodium 136   Lipid Panel    Component Value Date/Time   CHOL 175 07/19/2016 0829   TRIG 56 07/19/2016 0829   HDL 84 07/19/2016 0829   CHOLHDL 2.1 07/19/2016 0829   CHOLHDL 2.3 06/30/2015 0848   VLDL 14 06/30/2015 0848   LDLCALC 80 07/19/2016 0829    Additional studies/ records that were reviewed today include:  Myoview 06/30/2015  Nuclear stress EF: 56%. No wall motion abnormalities  There was no ST segment deviation noted during stress.  This is a low risk study. No ischemia identified.   Echocardiogram: 12/2014 Study Conclusions  - Left ventricle: The cavity size was normal. Wall thickness was   normal. Systolic function was normal. The estimated ejection   fraction was in the range of 55% to 60%. Wall motion was normal;   there were no regional wall motion abnormalities. There was no   evidence of elevated ventricular filling pressure by Doppler   parameters. - Mitral valve: Calcified annulus.  Impressions:  - No cardiac source of emboli was indentified.  Left Heart Cath and Coronary Angiography 12/2014  Conclusion    Ost RCA lesion, 30% stenosed.  Ost LM to LM lesion, 30% stenosed.  Mid Cx lesion, 25% stenosed.  Dist LAD lesion, 40% stenosed.  Prox LAD lesion, 90% stenosed. Post intervention, there is a 0% residual stenosis.  The  left ventricular systolic function is normal.   1. Severe single vessel CAD with successful stenting of severe proximal LAD stenosis 2. Mild nonobstructive plaquing in the left main, RCA, and left circumflex 3. Normal/vigorous LV systolic function  DAPT with ASA and plavix at least 12 months   Myoivew 06/2015  Nuclear stress EF: 56%. No wall motion abnormalities  There was no ST segment deviation noted during stress.  This is a low risk study. No ischemia identified.   ASSESSMENT & PLAN:   1. Unstable angina with hx of CAD s/p DES to LAD in 12/2014 - Symptoms is similar to prior angina. Initially symptomatic with exertion, now present at rest as well. She had a normal stress test 06/2015. Discussed medical management versus invasive evaluation. Patient prefers cardiac catheterization for further definitive evaluation as symptoms similar to prior angina. Will proceed. Continue aspirin, beta blocker and statin.  2. PAF in setting of small bowel perforation - previously on Eliquis. Discontinued due to GI bleeding and anemia. Maintaining sinus rhythm.  3. HLD - 07/19/2016: Cholesterol, Total 175; HDL 84; LDL Calculated 80; Triglycerides 56  - Continue statin.  4. HTN - Recently elevated at home as well. Today 150/76. Add Amlodipine 5mg  qd.   5. Sinus bradycardia - Heart rate is 50s on Coreg 25 mg twice a day. She is complaining of fatigue and tiredness since being on this medication. Might consider cut back to 12.5 mg twice a day during follow-up pending blood pressure readings.   Medication Adjustments/Labs and Tests Ordered: Current medicines are reviewed at length with the patient today.  Concerns regarding medicines are outlined above.  Medication changes, Labs and Tests ordered today are listed in the Patient Instructions below. Patient Instructions  Medication Instructions:    START TAKING AMLODIPINE 5 MG ONCE A DAY   If you need a refill on your cardiac medications before  your next appointment, please call your pharmacy.  Labwork: BMET CBC  AND PT/INR    Testing/Procedures: SEE LETTER FOR L HEART CATH ON 09-21-16 WITH DR END   Follow-Up: ONE WEEK POST CATH FOLLOW UP WITH VIN  AFTER 09-21-16    Any Other Special Instructions Will Be Listed Below (If Applicable).                                                                                                                                                       Jarrett Soho, [place]  09/20/2016 10:55 AM    [facility] [address], [place], [place]  [address] Phone: [phone]; Fax: [phone]

## 2016-09-27 NOTE — Progress Notes (Signed)
Site area: right groin fa sheath Site Prior to Removal:  Level 0 Pressure Applied For: 20 minutes Manual:   yes Patient Status During Pull:  stable Post Pull Site:  Level 0 Post Pull Instructions Given:  yes Post Pull Pulses Present: palpable Dressing Applied:  Gauze and tegaderm Bedrest begins @ 1650 Comments:

## 2016-09-27 NOTE — Interval H&P Note (Signed)
Cath Lab Visit (complete for each Cath Lab visit)  Clinical Evaluation Leading to the Procedure:   ACS: No.  Non-ACS:    Anginal Classification: CCS III  Anti-ischemic medical therapy: Maximal Therapy (2 or more classes of medications)  Non-Invasive Test Results: No non-invasive testing performed  Prior CABG: No previous CABG      History and Physical Interval Note:  09/27/2016 2:41 PM  [name]  has presented today for surgery, with the diagnosis of unstable angina  The various methods of treatment have been discussed with the patient and family. After consideration of risks, benefits and other options for treatment, the patient has consented to  Procedure(s): Left Heart Cath and Coronary Angiography (N/A) as a surgical intervention .  The patient's history has been reviewed, patient examined, no change in status, stable for surgery.  I have reviewed the patient's chart and labs.  Questions were answered to the patient's satisfaction.     Rachel Park

## 2016-09-28 ENCOUNTER — Encounter (HOSPITAL_COMMUNITY): Payer: Self-pay | Admitting: Cardiovascular Disease

## 2016-10-02 DIAGNOSIS — I10 Essential (primary) hypertension: Secondary | ICD-10-CM | POA: Diagnosis not present

## 2016-10-02 DIAGNOSIS — K219 Gastro-esophageal reflux disease without esophagitis: Secondary | ICD-10-CM | POA: Diagnosis not present

## 2016-10-02 DIAGNOSIS — I25119 Atherosclerotic heart disease of native coronary artery with unspecified angina pectoris: Secondary | ICD-10-CM | POA: Diagnosis not present

## 2016-10-04 ENCOUNTER — Ambulatory Visit (INDEPENDENT_AMBULATORY_CARE_PROVIDER_SITE_OTHER): Payer: Medicare HMO | Admitting: Physician Assistant

## 2016-10-04 ENCOUNTER — Encounter: Payer: Self-pay | Admitting: Physician Assistant

## 2016-10-04 VITALS — BP 118/70 | HR 60 | Ht 62.0 in | Wt 133.0 lb

## 2016-10-04 DIAGNOSIS — R1013 Epigastric pain: Secondary | ICD-10-CM

## 2016-10-04 DIAGNOSIS — I10 Essential (primary) hypertension: Secondary | ICD-10-CM

## 2016-10-04 DIAGNOSIS — I480 Paroxysmal atrial fibrillation: Secondary | ICD-10-CM | POA: Diagnosis not present

## 2016-10-04 DIAGNOSIS — E785 Hyperlipidemia, unspecified: Secondary | ICD-10-CM

## 2016-10-04 DIAGNOSIS — I25110 Atherosclerotic heart disease of native coronary artery with unstable angina pectoris: Secondary | ICD-10-CM

## 2016-10-04 NOTE — Patient Instructions (Addendum)
Your physician recommends that you continue on your current medications as directed. Please refer to the Current Medication list given to you today. Keep your already scheduled appointment with Dr. Acie Fredrickson.

## 2016-10-04 NOTE — Progress Notes (Signed)
Cardiology Office Note    Date:  10/04/2016   ID:  Rachel Park, DOB 03/08/1931, MRN [id number]  PCP:  Lajean Manes, MD  Cardiologist:  Dr. Acie Fredrickson  Chief Complaint: cath follow up  History of Present Illness:   Rachel Park is a 83 y.o. female with hx of CAD s/p DES to LAD, CVA while stenting, HTN, PAF, HLD and hypothyroidism presents for follow up.   She was admitted to [facility] on 12/24/2014 for evaluation of exertional angina. Troponin's were negative. Cath  revealed high grade LAD which was treated with an LAD DES. Post procedure she developed some speech difficulty and neurology was consulted. Work up suggested left subinsular cortex infarct embolic most likely a result status post PCI. She improved over the next 48 hrs.  Hx of atrial fibrillation in the setting of a small bowel perforation and hypertension. She was on several pressors. The atrial fibrillation has resolved. She was started on Eliquis but has had some problems with GI bleeding and anemia. Stopped the Eliquis since she is not had any recurrent atrial fibrillation.  Seen by me 09/20/16 for symptoms concerning for unstable angina. Follow-up cardiac catheterization showed a patent proximal LAD stent with mild non obstructive CAD otherwise as described below. Medical therapy recommended. She also noted sinus bradycardia rate in 50s with minimally elevated blood pressure during office visit. Amlodipine 5 mg added to regimen.  Here today for follow up. Today she tells that she has cut back on Coreg to 12.5mg  for 6-8 weeks without improvement prior to last appointment. Now on 25mg  BID. She continues to have fatigue all the times. Denies chest pain, shortness of breath, lower extremity edema, orthopnea, PND, syncope, dizziness or melena. She has noted abdominal/epigastric discomfort for the past few days. Started taking Zantac 75 mg daily.   Past Medical History:  Diagnosis Date  . Anginal pain ([place])   . Arthritis   .  Complication of anesthesia   . Coronary artery disease   . Dyspnea    exertion  . Hypertension   . Hypothyroidism   . PAF (paroxysmal atrial fibrillation) ([place])   . PONV (postoperative nausea and vomiting)   . Small bowel obstruction ([place]) 06/2015  . Stroke [facility])    oct 2016  . TIA (transient ischemic attack) 03/24/2015  . Vertigo, benign positional    to be evaluated, intermittent    Past Surgical History:  Procedure Laterality Date  . ABDOMINAL HYSTERECTOMY    . CARDIAC CATHETERIZATION N/A 12/24/2014   Procedure: Left Heart Cath and Coronary Angiography;  Surgeon: Sherren Mocha, MD;  Location: [place] [facility];  Service: Cardiovascular;  Laterality: N/A;  . CATARACT EXTRACTION Bilateral   . COLONOSCOPY WITH PROPOFOL N/A 12/01/2013   Procedure: COLONOSCOPY WITH PROPOFOL;  Surgeon: Garlan Fair, MD;  Location: [facility];  Service: Endoscopy;  Laterality: N/A;  . INCISIONAL HERNIA REPAIR N/A 03/10/2016   Procedure: INCISIONAL HERNIA REPAIR TIMES TWO;  Surgeon: Armandina Gemma, MD;  Location: [place];  Service: General;  Laterality: N/A;  . INGUINAL HERNIA REPAIR Bilateral 03/10/2016   Procedure: BILATERAL INGUINAL HERNIA REPAIRS;  Surgeon: Armandina Gemma, MD;  Location: [place];  Service: General;  Laterality: Bilateral;  . INSERTION OF MESH N/A 03/10/2016   Procedure: INSERTION OF MESH TO BILATERAL GROINS AND ABDOMEN;  Surgeon: Armandina Gemma, MD;  Location: [place];  Service: General;  Laterality: N/A;  . laparotomy  06/2015  . LEFT HEART CATH AND CORONARY ANGIOGRAPHY N/A 09/27/2016   Procedure:  Left Heart Cath and Coronary Angiography;  Surgeon: Sherren Mocha, MD;  Location: [place] [facility];  Service: Cardiovascular;  Laterality: N/A;  . TONSILLECTOMY      Current Medications:  Prior to Admission medications   Medication Sig Start Date End Date Taking? Authorizing Provider  amLODipine (NORVASC) 5 MG tablet Take 1 tablet (5 mg total) by mouth daily. 09/20/16 12/19/16 Yes Mychelle Kendra,  Grae Cannata, PA  aspirin EC 81 MG tablet Take 1 tablet (81 mg total) by mouth daily. 12/14/15  Yes Nahser, Wonda Cheng, MD  Calcium Carb-Cholecalciferol (CALCIUM 600 + D PO) Take 1 tablet by mouth daily at 12 noon.    Yes [provider]  carvedilol (COREG) 25 MG tablet Take 25 mg by mouth 2 (two) times daily. 08/28/15  Yes [provider]  cholecalciferol (VITAMIN D) 1000 UNITS tablet Take 2,000 Units by mouth daily.    Yes [provider]  ibuprofen (ADVIL,MOTRIN) 200 MG tablet Take 400 mg by mouth every 8 (eight) hours as needed (for arthritic pain in hands).   Yes [provider]  levothyroxine (SYNTHROID, LEVOTHROID) 75 MCG tablet Take 75 mcg by mouth daily before breakfast.   Yes [provider]  NITROSTAT 0.4 MG SL tablet Place 1 tablet (0.4 mg total) under the tongue every 5 (five) minutes as needed. Chest pain 09/22/15  Yes Nahser, Wonda Cheng, MD  Polyethyl Glycol-Propyl Glycol (SYSTANE ULTRA) 0.4-0.3 % SOLN Place 1-2 drops into both eyes 3 (three) times daily as needed (dry eyes).   Yes [provider]  ranitidine (ZANTAC) 75 MG tablet Take 75 mg by mouth 2 (two) times daily.   Yes [provider]  rosuvastatin (CRESTOR) 5 MG tablet Take 1 tablet (5 mg total) by mouth daily. 04/21/16  Yes Nahser, Wonda Cheng, MD    Allergies:   Anesthesia s-i-60   Social History   Social History  . Marital status: Divorced    Spouse name: N/A  . Number of children: 2  . Years of education: HS   Occupational History  . retired    Social History Main Topics  . Smoking status: Never Smoker  . Smokeless tobacco: Never Used  . Alcohol use No     Comment: occ.  . Drug use: No  . Sexual activity: Not Asked   Other Topics Concern  . None   Social History Narrative   Patient drinks 3-4 cups of caffeine daily.   Patient is right handed.      Family History:  The patient's family history includes Cancer in her sister; Heart attack in her  father; Stroke in her mother.   ROS:   Please see the history of present illness.    ROS All other systems reviewed and are negative.   PHYSICAL EXAM:   VS:  BP 118/70   Pulse 60   Ht 5\' 2"  (1.575 m)   Wt 133 lb (60.3 kg)   BMI 24.33 kg/m    GEN: Well nourished, well developed, in no acute distress  HEENT: normal  Neck: no JVD, carotid bruits, or masses Cardiac: RRR; no murmurs, rubs, or gallops,no edema  Respiratory:  clear to auscultation bilaterally, normal work of breathing GI: soft, nontender, nondistended, + BS MS: no deformity or atrophy  Skin: warm and dry, no rash Neuro:  Alert and Oriented x 3, Strength and sensation are intact Psych: euthymic mood, full affect  Wt Readings from Last 3 Encounters:  10/04/16 133 lb (60.3 kg)  09/27/16 136  lb (61.7 kg)  09/20/16 136 lb (61.7 kg)      Studies/Labs Reviewed:   EKG:  EKG is not  ordered today.    Recent Labs: 07/19/2016: ALT 23 09/20/2016: BUN 16; Creatinine, Ser 0.61; Hemoglobin 13.4; Platelets 263; Potassium 5.3; Sodium 133   Lipid Panel    Component Value Date/Time   CHOL 175 07/19/2016 0829   TRIG 56 07/19/2016 0829   HDL 84 07/19/2016 0829   CHOLHDL 2.1 07/19/2016 0829   CHOLHDL 2.3 06/30/2015 0848   VLDL 14 06/30/2015 0848   LDLCALC 80 07/19/2016 0829    Additional studies/ records that were reviewed today include:   Left Heart Cath and Coronary Angiography  Conclusion   1. Single-vessel coronary artery disease with patency of the proximal LAD stent and mild nonobstructive stenosis in the mid vessel 2. Widely patent left main, left circumflex, and RCA with no significant obstructive lesions. 3. Normal LV function with estimated LVEF 55-65%   Diagnostic Diagram          ASSESSMENT & PLAN:    1. CAD s/p DES to LAD in 12/2014 - Cath showed patent stent. Continue medical therapy. Her fatigue and tiredness is noncardiac. No improvement on treatment reduced dose of Coreg.   2. PAF in  setting of small bowel perforation - previously on Eliquis. Discontinued due to GI bleeding and anemia. Maintaining sinus rhythm.  3. HLD - 07/19/2016: Cholesterol, Total 175; HDL 84; LDL Calculated 80; Triglycerides 56  - Continue statin.  4. HTN -Stable and well controlled on current regimen.  5. Epigastric/abdominal pain - Continue Zantac. If no improvement follow-up with PCP. Try OTC Nexium.    Medication Adjustments/Labs and Tests Ordered: Current medicines are reviewed at length with the patient today.  Concerns regarding medicines are outlined above.  Medication changes, Labs and Tests ordered today are listed in the Patient Instructions below. There are no Patient Instructions on file for this visit.   Jarrett Soho, [place]  10/04/2016 11:49 AM    [facility] [address], [place], [place]  [address] Phone: [phone]; Fax: [phone]

## 2016-10-13 DIAGNOSIS — M545 Low back pain: Secondary | ICD-10-CM | POA: Diagnosis not present

## 2016-10-23 DIAGNOSIS — R69 Illness, unspecified: Secondary | ICD-10-CM | POA: Diagnosis not present

## 2016-11-27 ENCOUNTER — Encounter: Payer: Self-pay | Admitting: Cardiovascular Disease

## 2016-11-27 ENCOUNTER — Ambulatory Visit (INDEPENDENT_AMBULATORY_CARE_PROVIDER_SITE_OTHER): Payer: Medicare HMO | Admitting: Cardiovascular Disease

## 2016-11-27 VITALS — BP 140/90 | HR 70 | Ht 62.0 in | Wt 139.8 lb

## 2016-11-27 DIAGNOSIS — E782 Mixed hyperlipidemia: Secondary | ICD-10-CM

## 2016-11-27 DIAGNOSIS — I2510 Atherosclerotic heart disease of native coronary artery without angina pectoris: Secondary | ICD-10-CM | POA: Diagnosis not present

## 2016-11-27 DIAGNOSIS — E785 Hyperlipidemia, unspecified: Secondary | ICD-10-CM | POA: Insufficient documentation

## 2016-11-27 DIAGNOSIS — I25118 Atherosclerotic heart disease of native coronary artery with other forms of angina pectoris: Secondary | ICD-10-CM

## 2016-11-27 NOTE — Progress Notes (Signed)
Cardiology Office Note   Date:  11/27/2016   ID:  Rachel Park, DOB January 22, 1932, MRN [id number]  PCP:  Lajean Manes, MD  Cardiologist:   Mertie Moores, MD   Chief Complaint  Patient presents with  . Coronary Artery Disease    problem list 1. Coronary artery  disease-status post stenting of her proximal LAD 2.  CVA- occurred during the stenting   History of Present Illness: Rachel Park is a 83 y.o. female who presents for follow up of her stenting She has done well.  Is a bit anxious - may account for her elevated BP today .  Memory has improved ,  No residual CVA symptoms.   Feb. 1, 2017: Doing well Has some fatigue. Has some bruising on lower logs  -  On Plavix   June 21, 2015:  Rachel Park was diagnosed with atrial fib, was started on eliquis 5 mg BID.  She was in [place] with her sister. Developed abdominal pain .   Severe hypoxemia .   In great distress. Was found to have a small bowell perforation .   Was in the ICU for 2 days.   Developed hypotension , was started on levophed,   Developed atrial fib  plavix was stopped,   Heparin was started.   Has converted to NSR at this time  Has had some band like chest pain since that time Has shortness of breath - at rest and with exertion .     September 22, 2015:  Rachel Park is doing well.  We DC'd her Eliquis - due to bleeding.   She is on Plavix  Her only episode of a-fib was in the setting of a small bowel obstruction   Is very fatigued.   Was put on Iron tablet by Dr. Felipa Eth. Hb is still low  [place] also increased her Coreg to 25 BID .  BP readings at home have been well controlled.  Has not had any syncope Goes for a walk every day and is fatigued when she is done with the walk.   No CP .  Has occasional chest tightness .  Same symptoms back in April - Rachel Park was normal in April, 2017   Feb. 16, 2018:  Rachel Park is seen today . Has had surgery to repair 4 hernias.   Cant get her energy  Its only been a  month - I reminded her that it takes time to heal after surgery .  No CP or dyspnea.   Sept. 24, 2018:  Rachel Park is seen today  Had some CP in July Repeat cath shows no obstructive CAD ,  LAD stent was widely patent  Has had some bursitis of left hip and knee.  No CP .   Thought to have reflux.   Tried Zantac, caused diarrhea, did not continue the zantac.   She has tried Crestor 10 mg in the past. But we had to decrease the dose to 5 g because of muscle aches. She thinks that she might be eating little bit more than she should.  Past Medical History:  Diagnosis Date  . Anginal pain ([place])   . Arthritis   . Complication of anesthesia   . Coronary artery disease   . Dyspnea    exertion  . Hypertension   . Hypothyroidism   . PAF (paroxysmal atrial fibrillation) ([place])   . PONV (postoperative nausea and vomiting)   . Small bowel obstruction ([place]) 06/2015  . Stroke [facility])    oct  2016  . TIA (transient ischemic attack) 03/24/2015  . Vertigo, benign positional    to be evaluated, intermittent    Past Surgical History:  Procedure Laterality Date  . ABDOMINAL HYSTERECTOMY    . CARDIAC CATHETERIZATION N/A 12/24/2014   Procedure: Left Heart Cath and Coronary Angiography;  Surgeon: Sherren Mocha, MD;  Location: [place] [facility];  Service: Cardiovascular;  Laterality: N/A;  . CATARACT EXTRACTION Bilateral   . COLONOSCOPY WITH PROPOFOL N/A 12/01/2013   Procedure: COLONOSCOPY WITH PROPOFOL;  Surgeon: Garlan Fair, MD;  [facility];  Service: Endoscopy;  Laterality: N/A;  . INCISIONAL HERNIA REPAIR N/A 03/10/2016   Procedure: INCISIONAL HERNIA REPAIR TIMES TWO;  Surgeon: Armandina Gemma, MD;  Location: [place];  Service: General;  Laterality: N/A;  . INGUINAL HERNIA REPAIR Bilateral 03/10/2016   Procedure: BILATERAL INGUINAL HERNIA REPAIRS;  Surgeon: Armandina Gemma, MD;  Location: [place];  Service: General;  Laterality: Bilateral;  . INSERTION OF MESH N/A 03/10/2016   Procedure: INSERTION  OF MESH TO BILATERAL GROINS AND ABDOMEN;  Surgeon: Armandina Gemma, MD;  Location: [place];  Service: General;  Laterality: N/A;  . laparotomy  06/2015  . LEFT HEART CATH AND CORONARY ANGIOGRAPHY N/A 09/27/2016   Procedure: Left Heart Cath and Coronary Angiography;  Surgeon: Sherren Mocha, MD;  Location: [place] [facility];  Service: Cardiovascular;  Laterality: N/A;  . TONSILLECTOMY       Current Outpatient Prescriptions  Medication Sig Dispense Refill  . acetaminophen (TYLENOL) 500 MG tablet Take 500 mg by mouth every 6 (six) hours as needed.    Marland Kitchen amLODipine (NORVASC) 5 MG tablet Take 1 tablet (5 mg total) by mouth daily. 90 tablet 0  . aspirin EC 81 MG tablet Take 1 tablet (81 mg total) by mouth daily.    . Calcium Carb-Cholecalciferol (CALCIUM 600 + D PO) Take 1 tablet by mouth daily at 12 noon.     . carvedilol (COREG) 25 MG tablet Take 25 mg by mouth 2 (two) times daily.    . cholecalciferol (VITAMIN D) 1000 UNITS tablet Take 2,000 Units by mouth daily.     Marland Kitchen levothyroxine (SYNTHROID, LEVOTHROID) 75 MCG tablet Take 75 mcg by mouth daily before breakfast.    . NITROSTAT 0.4 MG SL tablet Place 1 tablet (0.4 mg total) under the tongue every 5 (five) minutes as needed. Chest pain 25 tablet 6  . Polyethyl Glycol-Propyl Glycol (SYSTANE ULTRA) 0.4-0.3 % SOLN Place 1-2 drops into both eyes 3 (three) times daily as needed (dry eyes).    . rosuvastatin (CRESTOR) 5 MG tablet Take 1 tablet (5 mg total) by mouth daily. 90 tablet 3   No current facility-administered medications for this visit.     Allergies:   Anesthesia s-i-60    Social History:  The patient  reports that she has never smoked. She has never used smokeless tobacco. She reports that she does not drink alcohol or use drugs.   Family History:  The patient's family history includes Cancer in her sister; Heart attack in her father; Stroke in her mother.    ROS:  Please see the history of present illness.    Review of  Systems: Constitutional:  denies fever, chills, diaphoresis, appetite change and fatigue.  HEENT: denies photophobia, eye pain, redness, hearing loss, ear pain, congestion, sore throat, rhinorrhea, sneezing, neck pain, neck stiffness and tinnitus.  Respiratory: denies SOB, DOE, cough, chest tightness, and wheezing.  Cardiovascular: denies chest pain, palpitations and leg swelling.  Gastrointestinal: denies nausea, vomiting, abdominal pain, diarrhea, constipation, blood in stool.  Genitourinary: denies dysuria, urgency, frequency, hematuria, flank pain and difficulty urinating.  Musculoskeletal: denies  myalgias, back pain, joint swelling, arthralgias and gait problem.   Skin: denies pallor, rash and wound.  Neurological: denies dizziness, seizures, syncope, weakness, light-headedness, numbness and headaches.   Hematological: denies adenopathy, easy bruising, personal or family bleeding history.  Psychiatric/ Behavioral: denies suicidal ideation, mood changes, confusion, nervousness, sleep disturbance and agitation.       All other systems are reviewed and negative.    PHYSICAL EXAM: Physical Exam: Blood pressure 140/90, pulse 70, height 5\' 2"  (1.575 m), weight 139 lb 12.8 oz (63.4 kg), SpO2 99 %. General: Well developed, well nourished, in no acute distress.  Head: Normocephalic, atraumatic, sclera non-icteric, mucus membranes are moist  Neck: Supple. Negative for carotid bruits. JVD not elevated.  Lungs: Clear bilaterally to auscultation without wheezes, rales, or rhonchi. Breathing is unlabored.  Heart: RRR with S1 S2. No murmurs, rubs, or gallops appreciated.  Abdomen: Soft, non-tender, non-distended with normoactive bowel sounds. No hepatomegaly. No rebound/guarding. No obvious abdominal masses.  Msk:  Strength and tone appear normal for age.  Extremities: No clubbing or cyanosis. No edema.  Distal pedal pulses are 2+ and equal bilaterally.  Neuro: Alert and oriented X 3.  Moves all extremities spontaneously.  Psych:  Responds to questions appropriately with a normal affect.    EKG:  EKG is ordered today. Sinus brady at 56.    ,    No ST or T wave changes.   Recent Labs: 07/19/2016: ALT 23 09/20/2016: BUN 16; Creatinine, Ser 0.61; Hemoglobin 13.4; Platelets 263; Potassium 5.3; Sodium 133    Lipid Panel    Component Value Date/Time   CHOL 175 07/19/2016 0829   TRIG 56 07/19/2016 0829   HDL 84 07/19/2016 0829   CHOLHDL 2.1 07/19/2016 0829   CHOLHDL 2.3 06/30/2015 0848   VLDL 14 06/30/2015 0848   LDLCALC 80 07/19/2016 0829      Wt Readings from Last 3 Encounters:  11/27/16 139 lb 12.8 oz (63.4 kg)  10/04/16 133 lb (60.3 kg)  09/27/16 136 lb (61.7 kg)      Other studies Reviewed: Additional studies/ records that were reviewed today include: . Review of the above records demonstrates:    ASSESSMENT AND PLAN:  1.  CAD - Repeat heart catheterization in July revealed a patent LAD stent. She did not have any obstructive lesions elsewhere. Her last LDL is 80. She thinks that she might be eating a little bit too much. Tried her on rosuvastatin 10 mg a day but she did not tolerate it due to muscle cramps . Continue rosuvastatin 5 mg a day. Recheck labs in 6 months at her next office visit.  2. Paroxysmal atrial fib - CHADS2VASC schore of 4-6  ( female, age > 67, vascular disease,   TIA and a CVA ( as a complication to her cath)  No recurrent AF.  She had GI bleeding with DOAC.   Have continued ASA     3. Hyperlipidemia:   Tolerating crestor 5 mg a day fairly  well   Current medicines are reviewed at length with the patient today.  The patient does not have concerns regarding medicines.  The following changes have been made:  no change  Labs/ tests ordered today include:  No orders of the defined types were placed in this encounter.   Disposition:   FU with me in  6 months     Mertie Moores, MD  11/27/2016 9:11 AM    [facility] [address], [place], [place]  [address] Phone: [phone]; Fax: [phone]

## 2016-11-27 NOTE — Patient Instructions (Signed)

## 2016-12-04 DIAGNOSIS — E7800 Pure hypercholesterolemia, unspecified: Secondary | ICD-10-CM | POA: Diagnosis not present

## 2016-12-04 DIAGNOSIS — Z23 Encounter for immunization: Secondary | ICD-10-CM | POA: Diagnosis not present

## 2016-12-04 DIAGNOSIS — I10 Essential (primary) hypertension: Secondary | ICD-10-CM | POA: Diagnosis not present

## 2016-12-04 DIAGNOSIS — E039 Hypothyroidism, unspecified: Secondary | ICD-10-CM | POA: Diagnosis not present

## 2016-12-07 DIAGNOSIS — H52203 Unspecified astigmatism, bilateral: Secondary | ICD-10-CM | POA: Diagnosis not present

## 2016-12-07 DIAGNOSIS — H43813 Vitreous degeneration, bilateral: Secondary | ICD-10-CM | POA: Diagnosis not present

## 2016-12-07 DIAGNOSIS — H35371 Puckering of macula, right eye: Secondary | ICD-10-CM | POA: Diagnosis not present

## 2016-12-07 DIAGNOSIS — H532 Diplopia: Secondary | ICD-10-CM | POA: Diagnosis not present

## 2016-12-25 DIAGNOSIS — N819 Female genital prolapse, unspecified: Secondary | ICD-10-CM | POA: Diagnosis not present

## 2016-12-25 DIAGNOSIS — Z01411 Encounter for gynecological examination (general) (routine) with abnormal findings: Secondary | ICD-10-CM | POA: Diagnosis not present

## 2017-01-23 DIAGNOSIS — I10 Essential (primary) hypertension: Secondary | ICD-10-CM | POA: Diagnosis not present

## 2017-02-02 DIAGNOSIS — E785 Hyperlipidemia, unspecified: Secondary | ICD-10-CM | POA: Diagnosis not present

## 2017-02-02 DIAGNOSIS — E039 Hypothyroidism, unspecified: Secondary | ICD-10-CM | POA: Diagnosis not present

## 2017-02-02 DIAGNOSIS — I25119 Atherosclerotic heart disease of native coronary artery with unspecified angina pectoris: Secondary | ICD-10-CM | POA: Diagnosis not present

## 2017-02-02 DIAGNOSIS — I10 Essential (primary) hypertension: Secondary | ICD-10-CM | POA: Diagnosis not present

## 2017-04-08 ENCOUNTER — Other Ambulatory Visit: Payer: Self-pay | Admitting: Cardiovascular Disease

## 2017-04-08 DIAGNOSIS — E785 Hyperlipidemia, unspecified: Secondary | ICD-10-CM

## 2017-04-08 DIAGNOSIS — I2510 Atherosclerotic heart disease of native coronary artery without angina pectoris: Secondary | ICD-10-CM

## 2017-04-08 DIAGNOSIS — Z9861 Coronary angioplasty status: Secondary | ICD-10-CM

## 2017-04-27 DIAGNOSIS — Z1231 Encounter for screening mammogram for malignant neoplasm of breast: Secondary | ICD-10-CM | POA: Diagnosis not present

## 2017-04-27 DIAGNOSIS — Z803 Family history of malignant neoplasm of breast: Secondary | ICD-10-CM | POA: Diagnosis not present

## 2017-05-01 DIAGNOSIS — R69 Illness, unspecified: Secondary | ICD-10-CM | POA: Diagnosis not present

## 2017-05-23 ENCOUNTER — Ambulatory Visit: Payer: Medicare HMO | Admitting: Cardiovascular Disease

## 2017-05-23 ENCOUNTER — Encounter: Payer: Self-pay | Admitting: Cardiovascular Disease

## 2017-05-23 VITALS — BP 136/82 | HR 53 | Ht 62.0 in | Wt 136.0 lb

## 2017-05-23 DIAGNOSIS — E782 Mixed hyperlipidemia: Secondary | ICD-10-CM | POA: Diagnosis not present

## 2017-05-23 DIAGNOSIS — I2510 Atherosclerotic heart disease of native coronary artery without angina pectoris: Secondary | ICD-10-CM

## 2017-05-23 DIAGNOSIS — I480 Paroxysmal atrial fibrillation: Secondary | ICD-10-CM | POA: Diagnosis not present

## 2017-05-23 DIAGNOSIS — I10 Essential (primary) hypertension: Secondary | ICD-10-CM

## 2017-05-23 DIAGNOSIS — E039 Hypothyroidism, unspecified: Secondary | ICD-10-CM | POA: Diagnosis not present

## 2017-05-23 DIAGNOSIS — I25119 Atherosclerotic heart disease of native coronary artery with unspecified angina pectoris: Secondary | ICD-10-CM | POA: Diagnosis not present

## 2017-05-23 LAB — HEPATIC FUNCTION PANEL
ALT: 16 IU/L (ref 0–32)
AST: 21 IU/L (ref 0–40)
Albumin: 4.5 g/dL (ref 3.5–4.7)
Alkaline Phosphatase: 72 IU/L (ref 39–117)
BILIRUBIN TOTAL: 0.5 mg/dL (ref 0.0–1.2)
Bilirubin, Direct: 0.19 mg/dL (ref 0.00–0.40)
Total Protein: 6.7 g/dL (ref 6.0–8.5)

## 2017-05-23 LAB — BASIC METABOLIC PANEL
BUN/Creatinine Ratio: 22 (ref 12–28)
BUN: 13 mg/dL (ref 8–27)
CALCIUM: 9.3 mg/dL (ref 8.7–10.3)
CO2: 24 mmol/L (ref 20–29)
CREATININE: 0.58 mg/dL (ref 0.57–1.00)
Chloride: 100 mmol/L (ref 96–106)
GFR calc Af Amer: 99 mL/min/{1.73_m2} (ref >59)
GFR, EST NON AFRICAN AMERICAN: 86 mL/min/{1.73_m2} (ref >59)
GLUCOSE: 94 mg/dL (ref 65–99)
Potassium: 4.6 mmol/L (ref 3.5–5.2)
Sodium: 137 mmol/L (ref 134–144)

## 2017-05-23 LAB — LIPID PANEL
CHOL/HDL RATIO: 2.2 ratio (ref 0.0–4.4)
Cholesterol, Total: 160 mg/dL (ref 100–199)
HDL: 73 mg/dL (ref >39)
LDL CALC: 75 mg/dL (ref 0–99)
TRIGLYCERIDES: 60 mg/dL (ref 0–149)
VLDL Cholesterol Cal: 12 mg/dL (ref 5–40)

## 2017-05-23 MED ORDER — HYDROCHLOROTHIAZIDE 12.5 MG PO CAPS: 12.5000 mg | ORAL_CAPSULE | Freq: Every day | ORAL | 3 refills | Status: DC

## 2017-05-23 MED ORDER — CARVEDILOL 6.25 MG PO TABS: 6.2500 mg | ORAL_TABLET | Freq: Two times a day (BID) | ORAL | 3 refills | Status: DC

## 2017-05-23 NOTE — Patient Instructions (Addendum)
Medication Instructions:  Your physician has recommended you make the following change in your medication:    DECREASE Carvedilol (Coreg) to 6.25 mg twice daily   START HCTZ (Hydrochlorothiazide) 12.5 mg once daily   Labwork: TODAY - cholesterol, liver panel, hepatic function panel  Your physician recommends that you return for lab work in: 3 weeks for basic metabolic panel on Thursday April 11 - you do not have to fast for this appointment   Testing/Procedures: None Ordered   Follow-Up: Your physician wants you to follow-up in: 6 months with Dr. Acie Fredrickson. You will receive a reminder letter in the mail two months in advance. If you don't receive a letter, please call our office to schedule the follow-up appointment.   If you need a refill on your cardiac medications before your next appointment, please call your pharmacy.   Thank you for choosing [facility]! Christen Bame, RN [phone]

## 2017-05-23 NOTE — Progress Notes (Signed)
Cardiology Office Note   Date:  05/23/2017   ID:  Rachel Park, DOB 01-23-1932, MRN [id number]  PCP:  Lajean Manes, MD  Cardiologist:   Mertie Moores, MD   Chief Complaint  Patient presents with  . Coronary Artery Disease  . Hyperlipidemia    problem list 1. Coronary artery  disease-status post stenting of her proximal LAD 2.  CVA- occurred during the stenting   History of Present Illness: Rachel Park is a 84 y.o. female who presents for follow up of her stenting She has done well.  Is a bit anxious - may account for her elevated BP today .  Memory has improved ,  No residual CVA symptoms.   Feb. 1, 2017: Doing well Has some fatigue. Has some bruising on lower logs  -  On Plavix   June 21, 2015:  [name] was diagnosed with atrial fib, was started on eliquis 5 mg BID.  She was in [place] with her sister. Developed abdominal pain .   Severe hypoxemia .   In great distress. Was found to have a small bowell perforation .   Was in the ICU for 2 days.   Developed hypotension , was started on levophed,   Developed atrial fib  plavix was stopped,   Heparin was started.   Has converted to NSR at this time  Has had some band like chest pain since that time Has shortness of breath - at rest and with exertion .     September 22, 2015:  Rachel Park is doing well.  We DC'd her Eliquis - due to bleeding.   She is on Plavix  Her only episode of a-fib was in the setting of a small bowel obstruction   Is very fatigued.   Was put on Iron tablet by Dr. Felipa Eth. Hb is still low  Stoneking also increased her Coreg to 25 BID .  BP readings at home have been well controlled.  Has not had any syncope Goes for a walk every day and is fatigued when she is done with the walk.   No CP .  Has occasional chest tightness .  Same symptoms back in April - [name] was normal in April, 2017   Feb. 16, 2018:  Rachel Park is seen today . Has had surgery to repair 4 hernias.   Cant get her energy    Its only been a month - I reminded her that it takes time to heal after surgery .  No CP or dyspnea.   Sept. 24, 2018:  Rachel Park is seen today  Had some CP in July Repeat cath shows no obstructive CAD ,  LAD stent was widely patent  Has had some bursitis of left hip and knee.  No CP .   Thought to have reflux.   Tried Zantac, caused diarrhea, did not continue the zantac.   She has tried Crestor 10 mg in the past. But we had to decrease the dose to 5 g because of muscle aches. She thinks that she might be eating little bit more than she should.  Past Medical History:  Diagnosis Date  . Anginal pain ([place])   . Arthritis   . Complication of anesthesia   . Coronary artery disease   . Dyspnea    exertion  . Hypertension   . Hypothyroidism   . PAF (paroxysmal atrial fibrillation) ([place])   . PONV (postoperative nausea and vomiting)   . Small bowel obstruction ([place]) 06/2015  . Stroke [facility])  oct 2016  . TIA (transient ischemic attack) 03/24/2015  . Vertigo, benign positional    to be evaluated, intermittent    Past Surgical History:  Procedure Laterality Date  . ABDOMINAL HYSTERECTOMY    . CARDIAC CATHETERIZATION N/A 12/24/2014   Procedure: Left Heart Cath and Coronary Angiography;  Surgeon: Sherren Mocha, MD;  Location: [place] [facility];  Service: Cardiovascular;  Laterality: N/A;  . CATARACT EXTRACTION Bilateral   . COLONOSCOPY WITH PROPOFOL N/A 12/01/2013   Procedure: COLONOSCOPY WITH PROPOFOL;  Surgeon: Garlan Fair, MD;  [facility];  Service: Endoscopy;  Laterality: N/A;  . INCISIONAL HERNIA REPAIR N/A 03/10/2016   Procedure: INCISIONAL HERNIA REPAIR TIMES TWO;  Surgeon: Armandina Gemma, MD;  Location: [place];  Service: General;  Laterality: N/A;  . INGUINAL HERNIA REPAIR Bilateral 03/10/2016   Procedure: BILATERAL INGUINAL HERNIA REPAIRS;  Surgeon: Armandina Gemma, MD;  Location: [place];  Service: General;  Laterality: Bilateral;  . INSERTION OF MESH N/A 03/10/2016    Procedure: INSERTION OF MESH TO BILATERAL GROINS AND ABDOMEN;  Surgeon: Armandina Gemma, MD;  Location: [place];  Service: General;  Laterality: N/A;  . laparotomy  06/2015  . LEFT HEART CATH AND CORONARY ANGIOGRAPHY N/A 09/27/2016   Procedure: Left Heart Cath and Coronary Angiography;  Surgeon: Sherren Mocha, MD;  Location: [place] [facility];  Service: Cardiovascular;  Laterality: N/A;  . TONSILLECTOMY       Current Outpatient Medications  Medication Sig Dispense Refill  . acetaminophen (TYLENOL) 500 MG tablet Take 500 mg by mouth every 6 (six) hours as needed for mild pain.     Marland Kitchen amLODipine (NORVASC) 5 MG tablet Take 1 tablet (5 mg total) by mouth daily. 90 tablet 0  . aspirin EC 81 MG tablet Take 1 tablet (81 mg total) by mouth daily.    . Calcium Carb-Cholecalciferol (CALCIUM 600 + D PO) Take 1 tablet by mouth daily at 12 noon.     . carvedilol (COREG) 25 MG tablet Take 12 mg by mouth 2 (two) times daily.     . cholecalciferol (VITAMIN D) 1000 UNITS tablet Take 2,000 Units by mouth daily.     Marland Kitchen levothyroxine (SYNTHROID, LEVOTHROID) 75 MCG tablet Take 75 mcg by mouth daily before breakfast.    . NITROSTAT 0.4 MG SL tablet Place 1 tablet (0.4 mg total) under the tongue every 5 (five) minutes as needed. Chest pain 25 tablet 6  . Polyethyl Glycol-Propyl Glycol (SYSTANE ULTRA) 0.4-0.3 % SOLN Place 1-2 drops into both eyes 3 (three) times daily as needed (dry eyes).    . rosuvastatin (CRESTOR) 5 MG tablet TAKE 1 TABLET BY MOUTH ONCE DAILY 90 tablet 2   No current facility-administered medications for this visit.     Allergies:   Anesthesia s-i-60    Social History:  The patient  reports that  has never smoked. she has never used smokeless tobacco. She reports that she does not drink alcohol or use drugs.   Family History:  The patient's family history includes Cancer in her sister; Heart attack in her father; Stroke in her mother.    ROS:  Please see the history of present illness.     Review of Systems: Constitutional:  denies fever, chills, diaphoresis, appetite change and fatigue.  HEENT: denies photophobia, eye pain, redness, hearing loss, ear pain, congestion, sore throat, rhinorrhea, sneezing, neck pain, neck stiffness and tinnitus.  Respiratory: denies SOB, DOE, cough, chest tightness, and wheezing.  Cardiovascular: denies chest pain,  palpitations and leg swelling.  Gastrointestinal: denies nausea, vomiting, abdominal pain, diarrhea, constipation, blood in stool.  Genitourinary: denies dysuria, urgency, frequency, hematuria, flank pain and difficulty urinating.  Musculoskeletal: denies  myalgias, back pain, joint swelling, arthralgias and gait problem.   Skin: denies pallor, rash and wound.  Neurological: denies dizziness, seizures, syncope, weakness, light-headedness, numbness and headaches.   Hematological: denies adenopathy, easy bruising, personal or family bleeding history.  Psychiatric/ Behavioral: denies suicidal ideation, mood changes, confusion, nervousness, sleep disturbance and agitation.       All other systems are reviewed and negative.    PHYSICAL EXAM: Physical Exam: Blood pressure 136/82, pulse (!) 53, height 5\' 2"  (1.575 m), weight 136 lb (61.7 kg), SpO2 98 %. General: Well developed, well nourished, in no acute distress.  Head: Normocephalic, atraumatic, sclera non-icteric, mucus membranes are moist  Neck: Supple. Negative for carotid bruits. JVD not elevated.  Lungs: Clear bilaterally to auscultation without wheezes, rales, or rhonchi. Breathing is unlabored.  Heart: RRR with S1 S2. No murmurs, rubs, or gallops appreciated.  Abdomen: Soft, non-tender, non-distended with normoactive bowel sounds. No hepatomegaly. No rebound/guarding. No obvious abdominal masses.  Msk:  Strength and tone appear normal for age.  Extremities: No clubbing or cyanosis. No edema.  Distal pedal pulses are 2+ and equal bilaterally.  Neuro: Alert and  oriented X 3. Moves all extremities spontaneously.  Psych:  Responds to questions appropriately with a normal affect.    EKG:  EKG is ordered today. Sinus brady at 56.    ,    No ST or T wave changes.   Recent Labs: 07/19/2016: ALT 23 09/20/2016: BUN 16; Creatinine, Ser 0.61; Hemoglobin 13.4; Platelets 263; Potassium 5.3; Sodium 133    Lipid Panel    Component Value Date/Time   CHOL 175 07/19/2016 0829   TRIG 56 07/19/2016 0829   HDL 84 07/19/2016 0829   CHOLHDL 2.1 07/19/2016 0829   CHOLHDL 2.3 06/30/2015 0848   VLDL 14 06/30/2015 0848   LDLCALC 80 07/19/2016 0829      Wt Readings from Last 3 Encounters:  05/23/17 136 lb (61.7 kg)  11/27/16 139 lb 12.8 oz (63.4 kg)  10/04/16 133 lb (60.3 kg)      Other studies Reviewed: Additional studies/ records that were reviewed today include: . Review of the above records demonstrates:    ASSESSMENT AND PLAN:  1.  CAD - Repeat heart catheterization in July revealed a patent LAD stent. She did not have any obstructive lesions elsewhere. Her last LDL is 80. She thinks that she might be eating a little bit too much. Tried her on rosuvastatin 10 mg a day but she did not tolerate it due to muscle cramps . Continue rosuvastatin 5 mg a day. Recheck labs in 6 months at her next office visit.  2. Paroxysmal atrial fib - CHADS2VASC schore of 4-6  ( female, age > 35, vascular disease,   TIA and a CVA ( as a complication to her cath)  No recurrent AF.  She had GI bleeding with DOAC.   Have continued ASA     3. Hyperlipidemia:   Tolerating crestor 5 mg a day fairly  well   Current medicines are reviewed at length with the patient today.  The patient does not have concerns regarding medicines.  The following changes have been made:  no change  Labs/ tests ordered today include:  No orders of the defined types were placed in this encounter.   Disposition:  FU with me in 6 months     Mertie Moores, MD  05/23/2017 11:06 AM    [address], [place], [place]  [address] Phone: [phone]; Fax: [phone]    Cardiology Office Note   Date:  05/23/2017   ID:  Tauheedah Bok, DOB Nov 23, 1931, MRN [id number]  PCP:  Lajean Manes, MD  Cardiologist:   Mertie Moores, MD   Chief Complaint  Patient presents with  . Coronary Artery Disease  . Hyperlipidemia    problem list 1. Coronary artery  disease-status post stenting of her proximal LAD 2.  CVA- occurred during the stenting   History of Present Illness: Rachel Park is a 84 y.o. female who presents for follow up of her stenting She has done well.  Is a bit anxious - may account for her elevated BP today .  Memory has improved ,  No residual CVA symptoms.   Feb. 1, 2017: Doing well Has some fatigue. Has some bruising on lower logs  -  On Plavix   June 21, 2015:  Nyala was diagnosed with atrial fib, was started on eliquis 5 mg BID.  She was in [place] with her sister. Developed abdominal pain .   Severe hypoxemia .   In great distress. Was found to have a small bowell perforation .   Was in the ICU for 2 days.   Developed hypotension , was started on levophed,   Developed atrial fib  plavix was stopped,   Heparin was started.   Has converted to NSR at this time  Has had some band like chest pain since that time Has shortness of breath - at rest and with exertion .     September 22, 2015:  Anaia is doing well.  We DC'd her Eliquis - due to bleeding.   She is on Plavix  Her only episode of a-fib was in the setting of a small bowel obstruction   Is very fatigued.   Was put on Iron tablet by Dr. Felipa Eth. Hb is still low  Stoneking also increased her Coreg to 25 BID .  BP readings at home have been well controlled.  Has not had any syncope Goes for a walk every day and is fatigued when she is done with the walk.   No CP .  Has occasional chest tightness .  Same symptoms back in April - [name] was normal  in April, 2017   Feb. 16, 2018:  Nely is seen today . Has had surgery to repair 4 hernias.   Cant get her energy  Its only been a month - I reminded her that it takes time to heal after surgery .  No CP or dyspnea.   Sept. 24, 2018:  Ladajah is seen today  Had some CP in July Repeat cath shows no obstructive CAD ,  LAD stent was widely patent  Has had some bursitis of left hip and knee.  No CP .   Thought to have reflux.   Tried Zantac, caused diarrhea, did not continue the zantac.   She has tried Crestor 10 mg in the past. But we had to decrease the dose to 5 g because of muscle aches. She thinks that she might be eating little bit more than she should.   May 23, 2017:  Doing well.  Has been working with the pharmacist at Dr. Sherryll Burger office  They are concerned about her HR in the 90s .  Has not had any syncope.   Has profound lack of energy - several hours after taking the coreg   Past Medical History:  Diagnosis Date  . Anginal pain ([place])   . Arthritis   . Complication of anesthesia   . Coronary artery disease   . Dyspnea    exertion  . Hypertension   . Hypothyroidism   . PAF (paroxysmal atrial fibrillation) ([place])   . PONV (postoperative nausea and vomiting)   . Small bowel obstruction ([place]) 06/2015  . Stroke [facility])    oct 2016  . TIA (transient ischemic attack) 03/24/2015  . Vertigo, benign positional    to be evaluated, intermittent    Past Surgical History:  Procedure Laterality Date  . ABDOMINAL HYSTERECTOMY    . CARDIAC CATHETERIZATION N/A 12/24/2014   Procedure: Left Heart Cath and Coronary Angiography;  Surgeon: Sherren Mocha, MD;  Location: [place] [facility];  Service: Cardiovascular;  Laterality: N/A;  . CATARACT EXTRACTION Bilateral   . COLONOSCOPY WITH PROPOFOL N/A 12/01/2013   Procedure: COLONOSCOPY WITH PROPOFOL;  Surgeon: Garlan Fair, MD;  [facility];  Service: Endoscopy;  Laterality: N/A;  . INCISIONAL HERNIA REPAIR N/A  03/10/2016   Procedure: INCISIONAL HERNIA REPAIR TIMES TWO;  Surgeon: Armandina Gemma, MD;  Location: [place];  Service: General;  Laterality: N/A;  . INGUINAL HERNIA REPAIR Bilateral 03/10/2016   Procedure: BILATERAL INGUINAL HERNIA REPAIRS;  Surgeon: Armandina Gemma, MD;  Location: [place];  Service: General;  Laterality: Bilateral;  . INSERTION OF MESH N/A 03/10/2016   Procedure: INSERTION OF MESH TO BILATERAL GROINS AND ABDOMEN;  Surgeon: Armandina Gemma, MD;  Location: [place];  Service: General;  Laterality: N/A;  . laparotomy  06/2015  . LEFT HEART CATH AND CORONARY ANGIOGRAPHY N/A 09/27/2016   Procedure: Left Heart Cath and Coronary Angiography;  Surgeon: Sherren Mocha, MD;  Location: [place] [facility];  Service: Cardiovascular;  Laterality: N/A;  . TONSILLECTOMY       Current Outpatient Medications  Medication Sig Dispense Refill  . acetaminophen (TYLENOL) 500 MG tablet Take 500 mg by mouth every 6 (six) hours as needed for mild pain.     Marland Kitchen amLODipine (NORVASC) 5 MG tablet Take 1 tablet (5 mg total) by mouth daily. 90 tablet 0  . aspirin EC 81 MG tablet Take 1 tablet (81 mg total) by mouth daily.    . Calcium Carb-Cholecalciferol (CALCIUM 600 + D PO) Take 1 tablet by mouth daily at 12 noon.     . carvedilol (COREG) 25 MG tablet Take 12 mg by mouth 2 (two) times daily.     . cholecalciferol (VITAMIN D) 1000 UNITS tablet Take 2,000 Units by mouth daily.     Marland Kitchen levothyroxine (SYNTHROID, LEVOTHROID) 75 MCG tablet Take 75 mcg by mouth daily before breakfast.    . NITROSTAT 0.4 MG SL tablet Place 1 tablet (0.4 mg total) under the tongue every 5 (five) minutes as needed. Chest pain 25 tablet 6  . Polyethyl Glycol-Propyl Glycol (SYSTANE ULTRA) 0.4-0.3 % SOLN Place 1-2 drops into both eyes 3 (three) times daily as needed (dry eyes).    . rosuvastatin (CRESTOR) 5 MG tablet TAKE 1 TABLET BY MOUTH ONCE DAILY 90 tablet 2   No current facility-administered medications for this visit.     Allergies:   Anesthesia  s-i-60    Social History:  The patient  reports that  has never smoked. she has never used smokeless tobacco. She reports that she does  not drink alcohol or use drugs.   Family History:  The patient's family history includes Cancer in her sister; Heart attack in her father; Stroke in her mother.    ROS:     Physical Exam: Blood pressure 136/82, pulse (!) 53, height 5\' 2"  (1.575 m), weight 136 lb (61.7 kg), SpO2 98 %.  GEN:  Well nourished, well developed in no acute distress HEENT: Normal NECK: No JVD; No carotid bruits LYMPHATICS: No lymphadenopathy CARDIAC: RR, normal S1 and S2. RESPIRATORY:  Clear to auscultation without rales, wheezing or rhonchi  ABDOMEN: Soft, non-tender, non-distended MUSCULOSKELETAL:  No edema; No deformity  SKIN: Warm and dry NEUROLOGIC:  Alert and oriented x 3  EKG: May 23, 2017:   Sinus brady at 57.   Otherwise normal   Recent Labs: 07/19/2016: ALT 23 09/20/2016: BUN 16; Creatinine, Ser 0.61; Hemoglobin 13.4; Platelets 263; Potassium 5.3; Sodium 133    Lipid Panel    Component Value Date/Time   CHOL 175 07/19/2016 0829   TRIG 56 07/19/2016 0829   HDL 84 07/19/2016 0829   CHOLHDL 2.1 07/19/2016 0829   CHOLHDL 2.3 06/30/2015 0848   VLDL 14 06/30/2015 0848   LDLCALC 80 07/19/2016 0829      Wt Readings from Last 3 Encounters:  05/23/17 136 lb (61.7 kg)  11/27/16 139 lb 12.8 oz (63.4 kg)  10/04/16 133 lb (60.3 kg)      Other studies Reviewed: Additional studies/ records that were reviewed today include: . Review of the above records demonstrates:    ASSESSMENT AND PLAN:  1.  CAD -   No angina .   2. Paroxysmal atrial fib - CHADS2VASC schore of 4-6  ( female, age > 63, vascular disease,   TIA and a CVA ( as a complication to her cath)  No recurrent AF.  We have tried Eliquis but she developed GI bleeding. Since her AF was limited and was related to an acute illness.  No additional episodes of AF seen     3. Hyperlipidemia:    Tolerating crestor 5 mg a day fairly  well   4.  Sinus bradycardia.:  She has not had any episodes of syncope but she does have profound fatigue in the early morning hours.  We will decrease the carvedilol to 6.25 mg twice a day.  5.  Hypertension:: Her blood pressure has been fairly well controlled.  We will decrease the carvedilol because of her sinus bradycardia.  We will add HCTZ 12.5 mg a day.  Current medicines are reviewed at length with the patient today.  The patient does not have concerns regarding medicines.  The following changes have been made:  no change  Labs/ tests ordered today include:  No orders of the defined types were placed in this encounter.   Disposition:   FU with me in 6 months     Mertie Moores, MD  05/23/2017 11:07 AM    [address], [place], [place]  [address] Phone: [phone]; Fax: [phone]

## 2017-05-23 NOTE — Addendum Note (Signed)
Addended by: De Burrs on: 05/23/2017 04:53 PM   Modules accepted: Orders

## 2017-06-12 ENCOUNTER — Other Ambulatory Visit: Payer: Medicare HMO

## 2017-06-12 DIAGNOSIS — E782 Mixed hyperlipidemia: Secondary | ICD-10-CM | POA: Diagnosis not present

## 2017-06-12 DIAGNOSIS — I2510 Atherosclerotic heart disease of native coronary artery without angina pectoris: Secondary | ICD-10-CM | POA: Diagnosis not present

## 2017-06-12 LAB — BASIC METABOLIC PANEL
BUN/Creatinine Ratio: 27 (ref 12–28)
BUN: 13 mg/dL (ref 8–27)
CALCIUM: 9.3 mg/dL (ref 8.7–10.3)
CO2: 25 mmol/L (ref 20–29)
CREATININE: 0.48 mg/dL — AB (ref 0.57–1.00)
Chloride: 99 mmol/L (ref 96–106)
GFR calc non Af Amer: 91 mL/min/{1.73_m2} (ref >59)
GFR, EST AFRICAN AMERICAN: 105 mL/min/{1.73_m2} (ref >59)
Glucose: 78 mg/dL (ref 65–99)
Potassium: 4.6 mmol/L (ref 3.5–5.2)
Sodium: 138 mmol/L (ref 134–144)

## 2017-06-14 ENCOUNTER — Other Ambulatory Visit: Payer: Medicare HMO

## 2017-07-04 ENCOUNTER — Ambulatory Visit: Payer: Medicare HMO | Admitting: Cardiovascular Disease

## 2017-07-04 ENCOUNTER — Encounter

## 2017-07-12 DIAGNOSIS — Z833 Family history of diabetes mellitus: Secondary | ICD-10-CM | POA: Diagnosis not present

## 2017-07-12 DIAGNOSIS — M1990 Unspecified osteoarthritis, unspecified site: Secondary | ICD-10-CM | POA: Diagnosis not present

## 2017-07-12 DIAGNOSIS — Z803 Family history of malignant neoplasm of breast: Secondary | ICD-10-CM | POA: Diagnosis not present

## 2017-07-12 DIAGNOSIS — Z8249 Family history of ischemic heart disease and other diseases of the circulatory system: Secondary | ICD-10-CM | POA: Diagnosis not present

## 2017-07-12 DIAGNOSIS — I2510 Atherosclerotic heart disease of native coronary artery without angina pectoris: Secondary | ICD-10-CM | POA: Diagnosis not present

## 2017-07-12 DIAGNOSIS — E039 Hypothyroidism, unspecified: Secondary | ICD-10-CM | POA: Diagnosis not present

## 2017-07-12 DIAGNOSIS — Z7982 Long term (current) use of aspirin: Secondary | ICD-10-CM | POA: Diagnosis not present

## 2017-07-12 DIAGNOSIS — E785 Hyperlipidemia, unspecified: Secondary | ICD-10-CM | POA: Diagnosis not present

## 2017-07-12 DIAGNOSIS — I10 Essential (primary) hypertension: Secondary | ICD-10-CM | POA: Diagnosis not present

## 2017-07-12 DIAGNOSIS — R32 Unspecified urinary incontinence: Secondary | ICD-10-CM | POA: Diagnosis not present

## 2017-08-20 DIAGNOSIS — G8929 Other chronic pain: Secondary | ICD-10-CM | POA: Diagnosis not present

## 2017-08-20 DIAGNOSIS — Z79899 Other long term (current) drug therapy: Secondary | ICD-10-CM | POA: Diagnosis not present

## 2017-08-20 DIAGNOSIS — I10 Essential (primary) hypertension: Secondary | ICD-10-CM | POA: Diagnosis not present

## 2017-08-20 DIAGNOSIS — Z1389 Encounter for screening for other disorder: Secondary | ICD-10-CM | POA: Diagnosis not present

## 2017-08-20 DIAGNOSIS — E7800 Pure hypercholesterolemia, unspecified: Secondary | ICD-10-CM | POA: Diagnosis not present

## 2017-08-20 DIAGNOSIS — M545 Low back pain: Secondary | ICD-10-CM | POA: Diagnosis not present

## 2017-08-20 DIAGNOSIS — E039 Hypothyroidism, unspecified: Secondary | ICD-10-CM | POA: Diagnosis not present

## 2017-08-20 DIAGNOSIS — Z0000 Encounter for general adult medical examination without abnormal findings: Secondary | ICD-10-CM | POA: Diagnosis not present

## 2017-10-30 ENCOUNTER — Encounter: Payer: Self-pay | Admitting: Cardiovascular Disease

## 2017-11-07 DIAGNOSIS — R69 Illness, unspecified: Secondary | ICD-10-CM | POA: Diagnosis not present

## 2017-11-10 ENCOUNTER — Other Ambulatory Visit: Payer: Self-pay | Admitting: Physician Assistant

## 2017-11-14 DIAGNOSIS — R69 Illness, unspecified: Secondary | ICD-10-CM | POA: Diagnosis not present

## 2017-11-20 ENCOUNTER — Ambulatory Visit: Payer: Medicare HMO | Admitting: Cardiovascular Disease

## 2017-11-20 ENCOUNTER — Encounter: Payer: Self-pay | Admitting: Cardiovascular Disease

## 2017-11-20 VITALS — BP 128/72 | HR 58 | Ht 62.0 in | Wt 140.0 lb

## 2017-11-20 DIAGNOSIS — I480 Paroxysmal atrial fibrillation: Secondary | ICD-10-CM | POA: Diagnosis not present

## 2017-11-20 DIAGNOSIS — E782 Mixed hyperlipidemia: Secondary | ICD-10-CM

## 2017-11-20 DIAGNOSIS — I2510 Atherosclerotic heart disease of native coronary artery without angina pectoris: Secondary | ICD-10-CM

## 2017-11-20 LAB — BASIC METABOLIC PANEL
BUN/Creatinine Ratio: 31 — ABNORMAL HIGH (ref 12–28)
BUN: 16 mg/dL (ref 8–27)
CO2: 25 mmol/L (ref 20–29)
Calcium: 9.3 mg/dL (ref 8.7–10.3)
Chloride: 102 mmol/L (ref 96–106)
Creatinine, Ser: 0.52 mg/dL — ABNORMAL LOW (ref 0.57–1.00)
GFR, EST AFRICAN AMERICAN: 102 mL/min/{1.73_m2} (ref >59)
GFR, EST NON AFRICAN AMERICAN: 89 mL/min/{1.73_m2} (ref >59)
Glucose: 89 mg/dL (ref 65–99)
Potassium: 4.4 mmol/L (ref 3.5–5.2)
Sodium: 140 mmol/L (ref 134–144)

## 2017-11-20 LAB — HEPATIC FUNCTION PANEL
ALK PHOS: 81 IU/L (ref 39–117)
ALT: 26 IU/L (ref 0–32)
AST: 35 IU/L (ref 0–40)
Albumin: 4.2 g/dL (ref 3.5–4.7)
BILIRUBIN TOTAL: 0.4 mg/dL (ref 0.0–1.2)
BILIRUBIN, DIRECT: 0.11 mg/dL (ref 0.00–0.40)
Total Protein: 6.3 g/dL (ref 6.0–8.5)

## 2017-11-20 LAB — LIPID PANEL
CHOLESTEROL TOTAL: 172 mg/dL (ref 100–199)
Chol/HDL Ratio: 2.4 ratio (ref 0.0–4.4)
HDL: 73 mg/dL (ref >39)
LDL Calculated: 84 mg/dL (ref 0–99)
Triglycerides: 73 mg/dL (ref 0–149)
VLDL Cholesterol Cal: 15 mg/dL (ref 5–40)

## 2017-11-20 NOTE — Progress Notes (Signed)
Cardiology Office Note   Date:  11/20/2017   ID:  Rachel Park, DOB [birth year], MRN [id number]  PCP:  Lajean Manes, MD  Cardiologist:   Mertie Moores, MD   Chief Complaint  Patient presents with  . Coronary Artery Disease    problem list 1. Coronary artery  disease-status post stenting of her proximal LAD 2.  CVA- occurred during the stenting   History of Present Illness: Rachel Park is a 84 y.o. female who presents for follow up of her stenting She has done well.  Is a bit anxious - may account for her elevated BP today .  Memory has improved ,  No residual CVA symptoms.   Feb. 1, 2017: Doing well Has some fatigue. Has some bruising on lower logs  -  On Plavix   June 21, 2015:  Rachel Park was diagnosed with atrial fib, was started on eliquis 5 mg BID.  She was in [place] with her sister. Developed abdominal pain .   Severe hypoxemia .   In great distress. Was found to have a small bowell perforation .   Was in the ICU for 2 days.   Developed hypotension , was started on levophed,   Developed atrial fib  plavix was stopped,   Heparin was started.   Has converted to NSR at this time  Has had some band like chest pain since that time Has shortness of breath - at rest and with exertion .     September 22, 2015:  [name] is doing well.  We DC'd her Eliquis - due to bleeding.   She is on Plavix  Her only episode of a-fib was in the setting of a small bowel obstruction   Is very fatigued.   Was put on Iron tablet by Dr. Felipa Eth. Hb is still low  [place] also increased her Coreg to 25 BID .  BP readings at home have been well controlled.  Has not had any syncope Goes for a walk every day and is fatigued when she is done with the walk.   No CP .  Has occasional chest tightness .  Same symptoms back in April - Rachel Park was normal in April, 2017   Feb. 16, 2018:  [name] is seen today . Has had surgery to repair 4 hernias.   Cant get her energy  Its only been a  month - I reminded her that it takes time to heal after surgery .  No CP or dyspnea.   Sept. 24, 2018:  [name] is seen today  Had some CP in July Repeat cath shows no obstructive CAD ,  LAD stent was widely patent  Has had some bursitis of left hip and knee.  No CP .   Thought to have reflux.   Tried Zantac, caused diarrhea, did not continue the zantac.   She has tried Crestor 10 mg in the past. But we had to decrease the dose to 5 g because of muscle aches. She thinks that she might be eating little bit more than she should.   May 23, 2017:  Doing well.  Has been working with the pharmacist at Dr. Sherryll Burger office  They are concerned about her HR in the 42s .  Has not had any syncope.   Has profound lack of energy - several hours after taking the coreg   Sept. 17, 2019: Rachel Park is seen for follow up of her CAD and PAF HR is typically low. Has not had any  further episodes of presyncope since we reduced the dose of Coreg. Still eats a bit more salt than she should     Past Medical History:  Diagnosis Date  . Anginal pain ([place])   . Arthritis   . Complication of anesthesia   . Coronary artery disease   . Dyspnea    exertion  . Hypertension   . Hypothyroidism   . PAF (paroxysmal atrial fibrillation) ([place])   . PONV (postoperative nausea and vomiting)   . Small bowel obstruction ([place]) 06/2015  . Stroke [facility])    oct 2016  . TIA (transient ischemic attack) 03/24/2015  . Vertigo, benign positional    to be evaluated, intermittent    Past Surgical History:  Procedure Laterality Date  . ABDOMINAL HYSTERECTOMY    . CARDIAC CATHETERIZATION N/A 12/24/2014   Procedure: Left Heart Cath and Coronary Angiography;  Surgeon: Sherren Mocha, MD;  Location: [facility];  Service: Cardiovascular;  Laterality: N/A;  . CATARACT EXTRACTION Bilateral   . COLONOSCOPY WITH PROPOFOL N/A 12/01/2013   Procedure: COLONOSCOPY WITH PROPOFOL;  Surgeon: Garlan Fair, MD;  Location: [facility]  ENDOSCOPY;  Service: Endoscopy;  Laterality: N/A;  . INCISIONAL HERNIA REPAIR N/A 03/10/2016   Procedure: INCISIONAL HERNIA REPAIR TIMES TWO;  Surgeon: Armandina Gemma, MD;  Location: [facility];  Service: General;  Laterality: N/A;  . INGUINAL HERNIA REPAIR Bilateral 03/10/2016   Procedure: BILATERAL INGUINAL HERNIA REPAIRS;  Surgeon: Armandina Gemma, MD;  Location: [facility];  Service: General;  Laterality: Bilateral;  . INSERTION OF MESH N/A 03/10/2016   Procedure: INSERTION OF MESH TO BILATERAL GROINS AND ABDOMEN;  Surgeon: Armandina Gemma, MD;  Location: [facility];  Service: General;  Laterality: N/A;  . laparotomy  06/2015  . LEFT HEART CATH AND CORONARY ANGIOGRAPHY N/A 09/27/2016   Procedure: Left Heart Cath and Coronary Angiography;  Surgeon: Sherren Mocha, MD;  Location: [facility];  Service: Cardiovascular;  Laterality: N/A;  . TONSILLECTOMY       Current Outpatient Medications  Medication Sig Dispense Refill  . acetaminophen (TYLENOL) 500 MG tablet Take 500 mg by mouth every 6 (six) hours as needed for mild pain.     Marland Kitchen amLODipine (NORVASC) 5 MG tablet TAKE 1 TABLET BY MOUTH ONCE DAILY 90 tablet 0  . aspirin EC 81 MG tablet Take 1 tablet (81 mg total) by mouth daily.    . Calcium Carb-Cholecalciferol (CALCIUM 600 + D PO) Take 1 tablet by mouth daily at 12 noon.     . carvedilol (COREG) 6.25 MG tablet Take 1 tablet (6.25 mg total) by mouth 2 (two) times daily. 180 tablet 3  . cholecalciferol (VITAMIN D) 1000 UNITS tablet Take 2,000 Units by mouth daily.     . hydrochlorothiazide (MICROZIDE) 12.5 MG capsule Take 12.5 mg by mouth daily.  3  . levothyroxine (SYNTHROID, LEVOTHROID) 75 MCG tablet Take 75 mcg by mouth daily before breakfast.    . NITROSTAT 0.4 MG SL tablet Place 1 tablet (0.4 mg total) under the tongue every 5 (five) minutes as needed. Chest pain 25 tablet 6  . Polyethyl Glycol-Propyl Glycol (SYSTANE ULTRA) 0.4-0.3 % SOLN Place 1-2 drops into both eyes 3 (three) times daily as needed (dry  eyes).    . rosuvastatin (CRESTOR) 5 MG tablet TAKE 1 TABLET BY MOUTH ONCE DAILY 90 tablet 2   No current facility-administered medications for this visit.     Allergies:   Anesthesia s-i-60    Social History:  The  patient  reports that she has never smoked. She has never used smokeless tobacco. She reports that she does not drink alcohol or use drugs.   Family History:  The patient's family history includes Cancer in her sister; Heart attack in her father; Stroke in her mother.    ROS:      Physical Exam: Blood pressure 128/72, pulse (!) 58, height 5\' 2"  (1.575 m), weight 140 lb (63.5 kg), SpO2 97 %.  GEN:  Well nourished, well developed in no acute distress HEENT: Normal NECK: No JVD; No carotid bruits LYMPHATICS: No lymphadenopathy CARDIAC: RRR   RESPIRATORY:  Clear to auscultation without rales, wheezing or rhonchi  ABDOMEN: Soft, non-tender, non-distended MUSCULOSKELETAL:  No edema; No deformity  SKIN: Warm and dry NEUROLOGIC:  Alert and oriented x 3  EKG:    Recent Labs: 05/23/2017: ALT 16 06/12/2017: BUN 13; Creatinine, Ser 0.48; Potassium 4.6; Sodium 138    Lipid Panel    Component Value Date/Time   CHOL 160 05/23/2017 1137   TRIG 60 05/23/2017 1137   HDL 73 05/23/2017 1137   CHOLHDL 2.2 05/23/2017 1137   CHOLHDL 2.3 06/30/2015 0848   VLDL 14 06/30/2015 0848   LDLCALC 75 05/23/2017 1137      Wt Readings from Last 3 Encounters:  11/20/17 140 lb (63.5 kg)  05/23/17 136 lb (61.7 kg)  11/27/16 139 lb 12.8 oz (63.4 kg)      Other studies Reviewed: Additional studies/ records that were reviewed today include: . Review of the above records demonstrates:    ASSESSMENT AND PLAN:  1.  CAD -    No angina.   Last LDL reading was 75.  We will check cholesterol levels today.  Continue Crestor 5 mg a day.  Continue aspirin 81 mg a day.  2. Paroxysmal atrial fib - CHADS2VASC schore of 4-6  ( female, age > 20, vascular disease,   TIA and a CVA ( as a  complication to her cath)  No recurrent AF.  We have tried Eliquis but she developed GI bleeding. Since her AF was limited and was related to an acute illness.        3. Hyperlipidemia:    Renew rosuvastatin.  4.  Sinus bradycardia.:    5.  Hypertension:: Pressure readings have been stable.  She is no longer having any episodes of presyncope.  Current medicines are reviewed at length with the patient today.  The patient does not have concerns regarding medicines.  The following changes have been made:  no change  Labs/ tests ordered today include:  No orders of the defined types were placed in this encounter.   Disposition:   FU with me in 6 months     Mertie Moores, MD  11/20/2017 10:28 AM    [address], [place], [place]  [address] Phone: [phone]; Fax: [phone]

## 2017-11-20 NOTE — Patient Instructions (Signed)
Medication Instructions:  Your physician recommends that you continue on your current medications as directed. Please refer to the Current Medication list given to you today.   Labwork: TODAY - cholesterol, liver panel, basic metabolic panel   Testing/Procedures: None Ordered   Follow-Up: Your physician wants you to follow-up in: 6 months with Dr. Nahser. You will receive a reminder letter in the mail two months in advance. If you don't receive a letter, please call our office to schedule the follow-up appointment.   If you need a refill on your cardiac medications before your next appointment, please call your pharmacy.   Thank you for choosing [facility]! Moriah Loughry, RN [phone]    

## 2017-11-21 ENCOUNTER — Telehealth: Payer: Self-pay | Admitting: Nurse Practitioner

## 2017-11-21 DIAGNOSIS — E782 Mixed hyperlipidemia: Secondary | ICD-10-CM

## 2017-11-21 DIAGNOSIS — I2510 Atherosclerotic heart disease of native coronary artery without angina pectoris: Secondary | ICD-10-CM

## 2017-11-21 MED ORDER — ROSUVASTATIN CALCIUM 10 MG PO TABS: 10.0000 mg | ORAL_TABLET | Freq: Every day | ORAL | 3 refills | Status: DC

## 2017-11-21 NOTE — Telephone Encounter (Signed)
Results and plan of care reviewed with patient who verbalized understanding and agreement. She is scheduled for repeat fasting lab appointment on 12/19. I advised her to call back prior to appointment with any questions or concerns. She thanked me for the call.

## 2017-11-21 NOTE — Telephone Encounter (Signed)
-----   Message from Thayer Headings, MD sent at 11/21/2017  2:51 PM EDT ----- LDL is 84. Her goal is 70 Please have her increase her rosuvastatin to 10 mg a day  Check lipids, BMP, liver enz in 3 months

## 2017-12-07 DIAGNOSIS — H532 Diplopia: Secondary | ICD-10-CM | POA: Diagnosis not present

## 2017-12-07 DIAGNOSIS — H35371 Puckering of macula, right eye: Secondary | ICD-10-CM | POA: Diagnosis not present

## 2017-12-07 DIAGNOSIS — Z961 Presence of intraocular lens: Secondary | ICD-10-CM | POA: Diagnosis not present

## 2017-12-07 DIAGNOSIS — H5213 Myopia, bilateral: Secondary | ICD-10-CM | POA: Diagnosis not present

## 2017-12-12 ENCOUNTER — Encounter (INDEPENDENT_AMBULATORY_CARE_PROVIDER_SITE_OTHER): Payer: Medicare HMO | Admitting: Ophthalmology

## 2017-12-12 DIAGNOSIS — H35033 Hypertensive retinopathy, bilateral: Secondary | ICD-10-CM | POA: Diagnosis not present

## 2017-12-12 DIAGNOSIS — H35341 Macular cyst, hole, or pseudohole, right eye: Secondary | ICD-10-CM | POA: Diagnosis not present

## 2017-12-12 DIAGNOSIS — I10 Essential (primary) hypertension: Secondary | ICD-10-CM

## 2017-12-12 DIAGNOSIS — H43813 Vitreous degeneration, bilateral: Secondary | ICD-10-CM

## 2018-02-19 DIAGNOSIS — I10 Essential (primary) hypertension: Secondary | ICD-10-CM | POA: Diagnosis not present

## 2018-02-19 DIAGNOSIS — Z79899 Other long term (current) drug therapy: Secondary | ICD-10-CM | POA: Diagnosis not present

## 2018-02-19 DIAGNOSIS — B9789 Other viral agents as the cause of diseases classified elsewhere: Secondary | ICD-10-CM | POA: Diagnosis not present

## 2018-02-19 DIAGNOSIS — J069 Acute upper respiratory infection, unspecified: Secondary | ICD-10-CM | POA: Diagnosis not present

## 2018-02-19 DIAGNOSIS — R5383 Other fatigue: Secondary | ICD-10-CM | POA: Diagnosis not present

## 2018-02-21 ENCOUNTER — Other Ambulatory Visit: Payer: Medicare HMO

## 2018-02-21 DIAGNOSIS — E782 Mixed hyperlipidemia: Secondary | ICD-10-CM | POA: Diagnosis not present

## 2018-02-21 DIAGNOSIS — I2510 Atherosclerotic heart disease of native coronary artery without angina pectoris: Secondary | ICD-10-CM

## 2018-02-21 LAB — HEPATIC FUNCTION PANEL
ALBUMIN: 4.1 g/dL (ref 3.5–4.7)
ALK PHOS: 81 IU/L (ref 39–117)
ALT: 15 IU/L (ref 0–32)
AST: 18 IU/L (ref 0–40)
BILIRUBIN TOTAL: 0.3 mg/dL (ref 0.0–1.2)
Bilirubin, Direct: 0.13 mg/dL (ref 0.00–0.40)
Total Protein: 6 g/dL (ref 6.0–8.5)

## 2018-02-21 LAB — BASIC METABOLIC PANEL
BUN/Creatinine Ratio: 18 (ref 12–28)
BUN: 11 mg/dL (ref 8–27)
CALCIUM: 9.3 mg/dL (ref 8.7–10.3)
CO2: 24 mmol/L (ref 20–29)
CREATININE: 0.6 mg/dL (ref 0.57–1.00)
Chloride: 93 mmol/L — ABNORMAL LOW (ref 96–106)
GFR, EST AFRICAN AMERICAN: 97 mL/min/{1.73_m2} (ref >59)
GFR, EST NON AFRICAN AMERICAN: 84 mL/min/{1.73_m2} (ref >59)
Glucose: 83 mg/dL (ref 65–99)
Potassium: 4.4 mmol/L (ref 3.5–5.2)
Sodium: 133 mmol/L — ABNORMAL LOW (ref 134–144)

## 2018-02-21 LAB — LIPID PANEL
CHOLESTEROL TOTAL: 135 mg/dL (ref 100–199)
Chol/HDL Ratio: 2.1 ratio (ref 0.0–4.4)
HDL: 63 mg/dL (ref >39)
LDL Calculated: 62 mg/dL (ref 0–99)
TRIGLYCERIDES: 52 mg/dL (ref 0–149)
VLDL CHOLESTEROL CAL: 10 mg/dL (ref 5–40)

## 2018-04-21 ENCOUNTER — Emergency Department (HOSPITAL_COMMUNITY): Payer: Medicare HMO

## 2018-04-21 ENCOUNTER — Other Ambulatory Visit: Payer: Self-pay

## 2018-04-21 ENCOUNTER — Encounter (HOSPITAL_COMMUNITY): Payer: Self-pay | Admitting: Emergency Medicine

## 2018-04-21 ENCOUNTER — Inpatient Hospital Stay (HOSPITAL_COMMUNITY)
Admission: EM | Admit: 2018-04-21 | Discharge: 2018-04-24 | DRG: 309 | Disposition: A | Discharge status: Home / Self Care | Payer: Medicare HMO | Attending: Internal Medicine | Admitting: Internal Medicine

## 2018-04-21 DIAGNOSIS — Z7982 Long term (current) use of aspirin: Secondary | ICD-10-CM

## 2018-04-21 DIAGNOSIS — Z79899 Other long term (current) drug therapy: Secondary | ICD-10-CM | POA: Diagnosis not present

## 2018-04-21 DIAGNOSIS — I4891 Unspecified atrial fibrillation: Secondary | ICD-10-CM | POA: Diagnosis present

## 2018-04-21 DIAGNOSIS — N280 Ischemia and infarction of kidney: Secondary | ICD-10-CM

## 2018-04-21 DIAGNOSIS — J449 Chronic obstructive pulmonary disease, unspecified: Secondary | ICD-10-CM | POA: Diagnosis not present

## 2018-04-21 DIAGNOSIS — Z955 Presence of coronary angioplasty implant and graft: Secondary | ICD-10-CM

## 2018-04-21 DIAGNOSIS — Z8249 Family history of ischemic heart disease and other diseases of the circulatory system: Secondary | ICD-10-CM | POA: Diagnosis not present

## 2018-04-21 DIAGNOSIS — E039 Hypothyroidism, unspecified: Secondary | ICD-10-CM | POA: Diagnosis not present

## 2018-04-21 DIAGNOSIS — R079 Chest pain, unspecified: Secondary | ICD-10-CM

## 2018-04-21 DIAGNOSIS — I480 Paroxysmal atrial fibrillation: Principal | ICD-10-CM

## 2018-04-21 DIAGNOSIS — E785 Hyperlipidemia, unspecified: Secondary | ICD-10-CM | POA: Diagnosis not present

## 2018-04-21 DIAGNOSIS — Z79890 Hormone replacement therapy (postmenopausal): Secondary | ICD-10-CM

## 2018-04-21 DIAGNOSIS — E876 Hypokalemia: Secondary | ICD-10-CM | POA: Diagnosis not present

## 2018-04-21 DIAGNOSIS — I10 Essential (primary) hypertension: Secondary | ICD-10-CM | POA: Diagnosis present

## 2018-04-21 DIAGNOSIS — I2510 Atherosclerotic heart disease of native coronary artery without angina pectoris: Secondary | ICD-10-CM | POA: Diagnosis not present

## 2018-04-21 DIAGNOSIS — Z884 Allergy status to anesthetic agent status: Secondary | ICD-10-CM | POA: Diagnosis not present

## 2018-04-21 DIAGNOSIS — I472 Ventricular tachycardia: Secondary | ICD-10-CM | POA: Diagnosis present

## 2018-04-21 DIAGNOSIS — J441 Chronic obstructive pulmonary disease with (acute) exacerbation: Secondary | ICD-10-CM | POA: Diagnosis not present

## 2018-04-21 DIAGNOSIS — R109 Unspecified abdominal pain: Secondary | ICD-10-CM | POA: Diagnosis not present

## 2018-04-21 DIAGNOSIS — Z823 Family history of stroke: Secondary | ICD-10-CM

## 2018-04-21 DIAGNOSIS — R0602 Shortness of breath: Secondary | ICD-10-CM | POA: Diagnosis not present

## 2018-04-21 DIAGNOSIS — Z8673 Personal history of transient ischemic attack (TIA), and cerebral infarction without residual deficits: Secondary | ICD-10-CM | POA: Diagnosis not present

## 2018-04-21 HISTORY — DX: Ischemia and infarction of kidney: N28.0

## 2018-04-21 LAB — URINALYSIS, ROUTINE W REFLEX MICROSCOPIC
BILIRUBIN URINE: NEGATIVE
Glucose, UA: NEGATIVE mg/dL
Hgb urine dipstick: NEGATIVE
KETONES UR: 5 mg/dL — AB
Leukocytes,Ua: NEGATIVE
Nitrite: NEGATIVE
Protein, ur: NEGATIVE mg/dL
Specific Gravity, Urine: 1.003 — ABNORMAL LOW (ref 1.005–1.030)
pH: 8 (ref 5.0–8.0)

## 2018-04-21 LAB — LACTIC ACID, PLASMA
Lactic Acid, Venous: 1.3 mmol/L (ref 0.5–1.9)
Lactic Acid, Venous: 1.4 mmol/L (ref 0.5–1.9)

## 2018-04-21 LAB — COMPREHENSIVE METABOLIC PANEL
ALT: 29 U/L (ref 0–44)
AST: 48 U/L — ABNORMAL HIGH (ref 15–41)
Albumin: 3.8 g/dL (ref 3.5–5.0)
Alkaline Phosphatase: 66 U/L (ref 38–126)
Anion gap: 12 (ref 5–15)
BUN: 9 mg/dL (ref 8–23)
CHLORIDE: 98 mmol/L (ref 98–111)
CO2: 23 mmol/L (ref 22–32)
Calcium: 9 mg/dL (ref 8.9–10.3)
Creatinine, Ser: 0.57 mg/dL (ref 0.44–1.00)
GFR calc Af Amer: >60 mL/min (ref >60)
GFR calc non Af Amer: >60 mL/min (ref >60)
Glucose, Bld: 105 mg/dL — ABNORMAL HIGH (ref 70–99)
Potassium: 3.6 mmol/L (ref 3.5–5.1)
Sodium: 133 mmol/L — ABNORMAL LOW (ref 135–145)
Total Bilirubin: 1.7 mg/dL — ABNORMAL HIGH (ref 0.3–1.2)
Total Protein: 6.7 g/dL (ref 6.5–8.1)

## 2018-04-21 LAB — CBC WITH DIFFERENTIAL/PLATELET
Abs Immature Granulocytes: 0.04 10*3/uL (ref 0.00–0.07)
Basophils Absolute: 0 10*3/uL (ref 0.0–0.1)
Basophils Relative: 0 %
EOS PCT: 1 %
Eosinophils Absolute: 0.1 10*3/uL (ref 0.0–0.5)
HCT: 40.6 % (ref 36.0–46.0)
HEMOGLOBIN: 13.5 g/dL (ref 12.0–15.0)
Immature Granulocytes: 0 %
Lymphocytes Relative: 10 %
Lymphs Abs: 1 10*3/uL (ref 0.7–4.0)
MCH: 30.8 pg (ref 26.0–34.0)
MCHC: 33.3 g/dL (ref 30.0–36.0)
MCV: 92.7 fL (ref 80.0–100.0)
Monocytes Absolute: 1 10*3/uL (ref 0.1–1.0)
Monocytes Relative: 9 %
NRBC: 0 % (ref 0.0–0.2)
Neutro Abs: 8.2 10*3/uL — ABNORMAL HIGH (ref 1.7–7.7)
Neutrophils Relative %: 80 %
Platelets: 234 10*3/uL (ref 150–400)
RBC: 4.38 MIL/uL (ref 3.87–5.11)
RDW: 13.6 % (ref 11.5–15.5)
WBC: 10.3 10*3/uL (ref 4.0–10.5)

## 2018-04-21 LAB — LIPASE, BLOOD: Lipase: 23 U/L (ref 11–51)

## 2018-04-21 LAB — I-STAT TROPONIN, ED: TROPONIN I, POC: 0.01 ng/mL (ref 0.00–0.08)

## 2018-04-21 LAB — ECHOCARDIOGRAM COMPLETE

## 2018-04-21 LAB — TROPONIN I
Troponin I: <0.03 ng/mL (ref <0.03)
Troponin I: <0.03 ng/mL (ref <0.03)
Troponin I: <0.03 ng/mL (ref <0.03)

## 2018-04-21 LAB — TSH: TSH: 2.863 u[IU]/mL (ref 0.350–4.500)

## 2018-04-21 MED ORDER — CARVEDILOL 6.25 MG PO TABS
6.2500 mg | ORAL_TABLET | Freq: Two times a day (BID) | ORAL | Status: DC
  Administered 2018-04-21 – 2018-04-22 (×3): 6.25 mg via ORAL
  Filled 2018-04-21 (×4): qty 1

## 2018-04-21 MED ORDER — ROSUVASTATIN CALCIUM 5 MG PO TABS
10.0000 mg | ORAL_TABLET | Freq: Every day | ORAL | Status: DC
  Administered 2018-04-22 – 2018-04-24 (×3): 10 mg via ORAL
  Filled 2018-04-21 (×3): qty 2

## 2018-04-21 MED ORDER — DILTIAZEM HCL-DEXTROSE 100-5 MG/100ML-% IV SOLN (PREMIX)
5.0000 mg/h | INTRAVENOUS | Status: DC
  Administered 2018-04-21: 15 mg/h via INTRAVENOUS
  Administered 2018-04-22: 5 mg/h via INTRAVENOUS
  Filled 2018-04-21 (×2): qty 100

## 2018-04-21 MED ORDER — ACETAMINOPHEN 325 MG PO TABS: 650.0000 mg | ORAL_TABLET | ORAL | Status: DC | PRN

## 2018-04-21 MED ORDER — DILTIAZEM HCL-DEXTROSE 100-5 MG/100ML-% IV SOLN (PREMIX)
5.0000 mg/h | INTRAVENOUS | Status: DC
  Administered 2018-04-21: 5 mg/h via INTRAVENOUS
  Filled 2018-04-21: qty 100

## 2018-04-21 MED ORDER — LEVOTHYROXINE SODIUM 75 MCG PO TABS
75.0000 ug | ORAL_TABLET | Freq: Every day | ORAL | Status: DC
  Administered 2018-04-22 – 2018-04-24 (×3): 75 ug via ORAL
  Filled 2018-04-21 (×3): qty 1

## 2018-04-21 MED ORDER — ASPIRIN EC 81 MG PO TBEC: 81.0000 mg | DELAYED_RELEASE_TABLET | Freq: Every day | ORAL | Status: DC

## 2018-04-21 MED ORDER — APIXABAN 5 MG PO TABS
10.0000 mg | ORAL_TABLET | Freq: Two times a day (BID) | ORAL | Status: DC
  Administered 2018-04-21 (×2): 10 mg via ORAL
  Filled 2018-04-21 (×3): qty 2

## 2018-04-21 MED ORDER — APIXABAN 5 MG PO TABS: 5.0000 mg | ORAL_TABLET | Freq: Two times a day (BID) | ORAL | Status: DC

## 2018-04-21 MED ORDER — IOHEXOL 300 MG/ML  SOLN
100.0000 mL | Freq: Once | INTRAMUSCULAR | Status: AC | PRN
  Administered 2018-04-21: 100 mL via INTRAVENOUS

## 2018-04-21 MED ORDER — ONDANSETRON HCL 4 MG/2ML IJ SOLN: 4.0000 mg | Freq: Four times a day (QID) | INTRAMUSCULAR | Status: DC | PRN

## 2018-04-21 MED ORDER — SODIUM CHLORIDE 0.9 % IV BOLUS
1000.0000 mL | Freq: Once | INTRAVENOUS | Status: AC
  Administered 2018-04-21: 1000 mL via INTRAVENOUS

## 2018-04-21 NOTE — H&P (Signed)
History and Physical    Raegen Tarpley OZD:[id number] DOB: 03/20/1931 DOA: 04/21/2018  PCP: Lajean Manes, MD Consultants:  Nahser - cardiology Patient coming from:  Home - lives alone; NOK: son, Dr. Curt Jews, [phone]  Chief Complaint:  Chest pain  HPI: Rachel Park is a 85 y.o. female with medical history significant of CVA; SBO; afib not on AC; hypothyroidism; HTN; and CAD s/p stent presenting with CP/abdominal pain. She developed chest tightness and weakness and stomach pain.  Symptoms started Wednesday afternoon.  She thought it would get better.  She has had afib in the past and it resolved.  She felt worse this time.  She was afraid this was similar to her prior cardiac chest pain when she needed a stent and finally decided to come in.  Substernal CP and across her mid-abdomen and also in the LLQ.  No back pain.  The pain may have gotten worse with eating.  Nothing made it better.  She took NTG and Zantac and Ibuprofen for her back pain in the very beginning.  The ibuprofen gave her relief but not the NTG or Zantac.  +SOB both exertional and while sitting still.  No cough.  Chronic intermittent LE edema, unchanged.  Weight gain of 5 pounds since Christmas due to eating.  No change in UOP or BMs.   ED Course: New afib with RVR, R renal infarct.  Maybe remote h/o afib on Eliquis very remotely.  No neurologic symptoms.  Troponin negative.  Cardiology has seen based on c/o CP similar to prior index symptoms.  Starting Heparin.  Review of Systems: As per HPI; otherwise review of systems reviewed and negative.   Ambulatory Status:  Ambulates without assistance  Past Medical History:  Diagnosis Date  . Anginal pain ([place])   . Arthritis   . Complication of anesthesia   . Coronary artery disease   . Dyspnea    exertion  . Hypertension   . Hypothyroidism   . PAF (paroxysmal atrial fibrillation) ([place])    on Xarelto for maybe a year, opted to stop  . PONV (postoperative nausea and  vomiting)   . Renal infarction ([place])   . Small bowel obstruction ([place]) 06/2015  . Stroke [facility])    oct 2016  . TIA (transient ischemic attack) 03/24/2015  . Vertigo, benign positional    to be evaluated, intermittent    Past Surgical History:  Procedure Laterality Date  . ABDOMINAL HYSTERECTOMY    . CARDIAC CATHETERIZATION N/A 12/24/2014   Procedure: Left Heart Cath and Coronary Angiography;  Surgeon: Sherren Mocha, MD;  Location: [place] [facility];  Service: Cardiovascular;  Laterality: N/A;  . CATARACT EXTRACTION Bilateral   . COLONOSCOPY WITH PROPOFOL N/A 12/01/2013   Procedure: COLONOSCOPY WITH PROPOFOL;  Surgeon: Garlan Fair, MD;  Location: [facility];  Service: Endoscopy;  Laterality: N/A;  . INCISIONAL HERNIA REPAIR N/A 03/10/2016   Procedure: INCISIONAL HERNIA REPAIR TIMES TWO;  Surgeon: Armandina Gemma, MD;  Location: [place];  Service: General;  Laterality: N/A;  . INGUINAL HERNIA REPAIR Bilateral 03/10/2016   Procedure: BILATERAL INGUINAL HERNIA REPAIRS;  Surgeon: Armandina Gemma, MD;  Location: [place];  Service: General;  Laterality: Bilateral;  . INSERTION OF MESH N/A 03/10/2016   Procedure: INSERTION OF MESH TO BILATERAL GROINS AND ABDOMEN;  Surgeon: Armandina Gemma, MD;  Location: [place];  Service: General;  Laterality: N/A;  . laparotomy  06/2015  . LEFT HEART CATH AND CORONARY ANGIOGRAPHY N/A 09/27/2016   Procedure: Left  Heart Cath and Coronary Angiography;  Surgeon: Sherren Mocha, MD;  Location: [place] [facility];  Service: Cardiovascular;  Laterality: N/A;  . TONSILLECTOMY      Social History   Socioeconomic History  . Marital status: Divorced    Spouse name: Not on file  . Number of children: 2  . Years of education: HS  . Highest education level: Not on file  Occupational History  . Occupation: retired  Scientific laboratory technician  . Financial resource strain: Not on file  . Food insecurity:    Worry: Not on file    Inability: Not on file  . Transportation needs:    Medical: Not  on file    Non-medical: Not on file  Tobacco Use  . Smoking status: Never Smoker  . Smokeless tobacco: Never Used  Substance and Sexual Activity  . Alcohol use: No    Comment: occ.  . Drug use: No  . Sexual activity: Not on file  Lifestyle  . Physical activity:    Days per week: Not on file    Minutes per session: Not on file  . Stress: Not on file  Relationships  . Social connections:    Talks on phone: Not on file    Gets together: Not on file    Attends religious service: Not on file    Active member of club or organization: Not on file    Attends meetings of clubs or organizations: Not on file    Relationship status: Not on file  . Intimate partner violence:    Fear of current or ex partner: Not on file    Emotionally abused: Not on file    Physically abused: Not on file    Forced sexual activity: Not on file  Other Topics Concern  . Not on file  Social History Narrative   Patient drinks 3-4 cups of caffeine daily.   Patient is right handed.     Allergies  Allergen Reactions  . Anesthesia S-I-60 Nausea And Vomiting    "with hysterectomy years back had post op N/V"    Family History  Problem Relation Age of Onset  . Heart attack Father   . Stroke Mother   . Cancer Sister        breast    Prior to Admission medications   Medication Sig Start Date End Date Taking? Authorizing Provider  acetaminophen (TYLENOL) 500 MG tablet Take 500 mg by mouth every 6 (six) hours as needed for mild pain.     [provider]  amLODipine (NORVASC) 5 MG tablet TAKE 1 TABLET BY MOUTH ONCE DAILY 11/13/17   Nahser, Wonda Cheng, MD  aspirin EC 81 MG tablet Take 1 tablet (81 mg total) by mouth daily. 12/14/15   Nahser, Wonda Cheng, MD  Calcium Carb-Cholecalciferol (CALCIUM 600 + D PO) Take 1 tablet by mouth daily at 12 noon.     [provider]  carvedilol (COREG) 6.25 MG tablet Take 1 tablet (6.25 mg total) by mouth 2 (two) times daily. 05/23/17   Nahser, Wonda Cheng, MD    cholecalciferol (VITAMIN D) 1000 UNITS tablet Take 2,000 Units by mouth daily.     [provider]  hydrochlorothiazide (MICROZIDE) 12.5 MG capsule Take 12.5 mg by mouth daily. 11/09/17   [provider]  levothyroxine (SYNTHROID, LEVOTHROID) 75 MCG tablet Take 75 mcg by mouth daily before breakfast.    [provider]  NITROSTAT 0.4 MG SL tablet Place 1 tablet (0.4 mg total) under  the tongue every 5 (five) minutes as needed. Chest pain 09/22/15   Nahser, Wonda Cheng, MD  Polyethyl Glycol-Propyl Glycol (SYSTANE ULTRA) 0.4-0.3 % SOLN Place 1-2 drops into both eyes 3 (three) times daily as needed (dry eyes).    [provider]  rosuvastatin (CRESTOR) 10 MG tablet Take 1 tablet (10 mg total) by mouth daily. 11/21/17 11/16/18  Nahser, Wonda Cheng, MD    Physical Exam: Vitals:   04/21/18 1115 04/21/18 1130 04/21/18 1145 04/21/18 1248  BP: 116/86 (!) 125/108 114/79 111/70  Pulse: (!) 54 79 76 73  Resp: 19 (!) 24 (!) 25   Temp:      TempSrc:      SpO2: 98% 95% 98% 100%     . General:  Appears calm and comfortable and is NAD; very pleasant . Eyes:  PERRL, EOMI, normal lids, iris . ENT:  grossly normal hearing, lips & tongue, mmm; appropriate dentition . Neck:  no LAD, masses or thyromegaly; no carotid bruits . Cardiovascular:  Irregularly irregular, rate about 100, no m/r/g. No LE edema.  Mar[place] Kitchen Respiratory:   CTA bilaterally with no wheezes/rales/rhonchi.  Normal respiratory effort. . Abdomen:  soft, NT, ND, NABS . Back:   normal alignment, no CVAT . Skin:  no rash or induration seen on limited exam . Musculoskeletal:  grossly normal tone BUE/BLE, good ROM, no bony abnormality . Psychiatric:  grossly normal mood and affect, speech fluent and appropriate, AOx3 . Neurologic:  CN 2-12 grossly intact, moves all extremities in coordinated fashion, sensation intact    Radiological Exams on Admission: Ct Abdomen Pelvis W Contrast  Result Date: 04/21/2018 CLINICAL DATA:   Diffuse abdominal pain since last night. Pain slightly worse on the left side. History of small-bowel obstruction. EXAM: CT ABDOMEN AND PELVIS WITH CONTRAST TECHNIQUE: Multidetector CT imaging of the abdomen and pelvis was performed using the standard protocol following bolus administration of intravenous contrast. CONTRAST:  126mL OMNIPAQUE IOHEXOL 300 MG/ML  SOLN COMPARISON:  Radiographs 07/22/2015 FINDINGS: Lower chest: Mild dependent atelectasis at both lung bases superimposed on mild underlying emphysematous changes. There is a small calcified granuloma anteriorly in the right lower lobe. No significant pleural or pericardial effusion. There is a small hiatal hernia. Hepatobiliary: The liver is normal in density without suspicious focal abnormality. No evidence of gallstones, gallbladder wall thickening or biliary dilatation. Pancreas: Unremarkable. No pancreatic ductal dilatation or surrounding inflammatory changes. Spleen: Normal in size without focal abnormality. Adrenals/Urinary Tract: Both adrenal glands appear normal. There is a large wedge-shaped area of decreased enhancement anterolaterally in the lower pole of the right kidney, suspicious for an infarct. There is another smaller area in the interpolar region of the right kidney. The left kidney appears normal. There is bilateral excretion of contrast and no hydronephrosis or urinary tract calculus. The bladder is distended but otherwise appears normal. Stomach/Bowel: No evidence of bowel wall thickening, distention or surrounding inflammatory change. The appendix is not clearly visualized. There is no pericecal inflammation. There is prominent stool in the distal colon mild sigmoid diverticulosis. Vascular/Lymphatic: There are no enlarged abdominal or pelvic lymph nodes. Aortic and branch vessel atherosclerosis. The renal arteries are patent bilaterally. There is ostial stenosis at the celiac trunk. The superior and inferior mesenteric arteries are  patent. The portal, superior mesenteric and renal veins are patent. Reproductive: Hysterectomy. Possible residual ovarian tissue bilaterally. No adnexal mass. Other: Postsurgical changes in the anterior abdominal wall with small infraumbilical hernia containing only fat. No ascites. Musculoskeletal: No acute or  significant osseous findings. Superior endplate compression deformity at L1 appears chronic. There are degenerative changes throughout the lumbar spine associated with a convex left scoliosis. IMPRESSION: 1. Probable acute/subacute right renal infarcts. Mass lesion on likely. Recommend follow-up CT in 2-3 months. 2. No other acute findings. No evidence of bowel obstruction or bowel ischemia. 3. Aortic and branch vessel atherosclerosis. The renal arteries and veins are patent. There is ostial stenosis of the celiac trunk. 4. Electronically Signed   By: Richardean Sale M.D.   On: 04/21/2018 10:56   Dg Chest Portable 1 View  Result Date: 04/21/2018 CLINICAL DATA:  Chest pain EXAM: PORTABLE CHEST 1 VIEW COMPARISON:  December 24, 2014 FINDINGS: No edema or consolidation. Heart is upper normal in size with pulmonary vascularity normal. No adenopathy. No bone lesions. No pneumothorax. IMPRESSION: No edema or consolidation.  Heart upper normal in size. Electronically Signed   By: Lowella Grip III M.D.   On: 04/21/2018 09:11    EKG: Independently reviewed.  Afib with rate 150; nonspecific ST changes with no evidence of acute ischemia   Labs on Admission: I have personally reviewed the available labs and imaging studies at the time of the admission.  Pertinent labs:   Na++ 133 Glucose 105 AST 48/ALT 29/Bili 1.7 Troponin 0.01, <0.03 Lactate 1.3, 1.4 WBC 10.3 Hgb 13.5 UA: 5 Hgb TSH 2.863  Assessment/Plan Principal Problem:   Atrial fibrillation with RVR (HCC) Active Problems:   Dyslipidemia   COPD (chronic obstructive pulmonary disease) (HCC)   Hypothyroidism   Renal infarction ([place])     Afib with RVR -Patient with prior h/o afib, not on medications. -She presented with chest and abdominal pain. -At the time of presentation, EKG showed RVR into the 150 range. -Additionally, imaging indicates right renal infarcts which are likely related to afib without AC.  -Since the afib onset is unknown, will focus on rate control. -Will admit to SDU for Diltiazem drip as per protocol with plan to transition to PO Diltiazem once heart rate is controlled.   -Troponin q6h x 3 -Will give ASA 81 mg PO daily.   -Treat chest pain with NTG prn.   -Echocardiogram performed today for further evaluation, preserved EF and overall reassuring  -Repeat EKG in AM -Cardiology has consulted. -She was previously on Eliquis but this was discontinued in 7/17 due to bleeding, based on afib occurring only previously  in the setting in SBO -CHA2DS2-VASc Score is >2 and so patient will need oral anticoagulation.  -After discussion with Dr. Johnsie Cancel, patient, and her son, will start Eliquis; pharmacy to dose.  Renal infarction -Likely related to lack of AC in the setting of afib  -She is recommended to have a f/u CT in 2-3 months  HTN -Hold home Norvasc since she is likely to go home on Cardizem instead. -Continue Coreg -Hold HCTZ for now but likely ok to resume once she is off Cardizem infusion  HLD -Continue Crestor  COPD -She does not appear to be taking any medications for this issue  Hypothyroidism -Check TSH -Continue Synthroid at current dose for now  CAD -s/p stenting with intraoperative CVA (no residual symptoms)   DVT prophylaxis:  Eliquis Code Status:  Full - confirmed with patient/family Family Communication: Son present throughout evaluation  Disposition Plan:  Home once clinically improved Consults called: Cardiology Admission status: Admit - It is my clinical opinion that admission to INPATIENT is reasonable and necessary because of the expectation that this patient will require  hospital care  that crosses at least 2 midnights to treat this condition based on the medical complexity of the problems presented.  Given the aforementioned information, the predictability of an adverse outcome is felt to be significant.    Karmen Bongo MD [facility]   How to contact the [facility] Attending or Consulting provider [place] or covering provider during after hours [place], for this patient?  1. Check the care team in [facility] and look for a) attending/consulting [facility] provider listed and b) the [facility] team listed 2. Log into [email or web address] and use [place]'s universal password to access. If you do not have the password, please contact the hospital operator. 3. Locate the [facility] provider you are looking for under [facility] and page to a number that you can be directly reached. 4. If you still have difficulty reaching the provider, please page the [facility] (Director on Call) for the Hospitalists listed on amion for assistance.   04/21/2018, 1:45 PM

## 2018-04-21 NOTE — ED Triage Notes (Addendum)
Patient reports central chest and upper abdominal  "pressure/discomfort " onset Wednesday , pt. took 1 NTG sl prior to arrival with no relief , denies diaphoresis or nausea , articulated SOB/dizziness this morning .

## 2018-04-21 NOTE — Progress Notes (Signed)
Discussed findings to date with patient and Dr Early CT with renal infarct likely from PAF.  Patient wanted to come off anticoagulation in past which is why it was Stopped despite PAF Discussed importance of staying on it for life TTE reviewed EF 55-60% no RWMAls moderate LAE Troponin negative  Continue coreg d/c norvasc transition to PO cardizem in am While in ER has had conversion to SR for short time Will likely need AAT as outpatient  Eliquis started   [employer]

## 2018-04-21 NOTE — Plan of Care (Signed)
  Problem: Education: Goal: Knowledge of General Education information will improve Description Including pain rating scale, medication(s)/side effects and non-pharmacologic comfort measures Outcome: Progressing   Problem: Clinical Measurements: Goal: Ability to maintain clinical measurements within normal limits will improve Outcome: Progressing Goal: Will remain free from infection Outcome: Progressing Goal: Cardiovascular complication will be avoided Outcome: Progressing   Problem: Activity: Goal: Risk for activity intolerance will decrease Outcome: Progressing   Problem: Pain Managment: Goal: General experience of comfort will improve Outcome: Progressing   Problem: Safety: Goal: Ability to remain free from injury will improve Outcome: Progressing   Problem: Skin Integrity: Goal: Risk for impaired skin integrity will decrease Outcome: Progressing   Problem: Health Behavior/Discharge Planning: Goal: Ability to manage health-related needs will improve Outcome: Completed/Met   Problem: Clinical Measurements: Goal: Respiratory complications will improve Outcome: Completed/Met   Problem: Nutrition: Goal: Adequate nutrition will be maintained Outcome: Completed/Met   Problem: Coping: Goal: Level of anxiety will decrease Outcome: Completed/Met   Problem: Elimination: Goal: Will not experience complications related to bowel motility Outcome: Completed/Met Goal: Will not experience complications related to urinary retention Outcome: Completed/Met

## 2018-04-21 NOTE — ED Provider Notes (Signed)
[facility] EMERGENCY DEPARTMENT Provider Note   CSN: [id number] Arrival date & time: 04/21/18  0962     History   Chief Complaint No chief complaint on file. chest pain  HPI Rachel Park is a 85 y.o. female.  The history is provided by the patient, medical records and a relative. No language interpreter was used.  Chest Pain  Pain location:  Substernal area Pain quality: crushing, pressure and tightness   Pain radiates to:  Epigastrium Pain severity:  Severe Onset quality:  Gradual Duration:  4 days Timing:  Intermittent Progression:  Waxing and waning Chronicity:  New Relieved by:  Nothing Worsened by:  Exertion Ineffective treatments:  Nitroglycerin Associated symptoms: abdominal pain, fatigue, palpitations and shortness of breath   Associated symptoms: no altered mental status, no back pain, no cough, no diaphoresis, no fever, no headache, no heartburn, no lower extremity edema, no nausea, no near-syncope, no numbness, no vomiting and no weakness   Abdominal Pain  Pain location:  Generalized Pain radiates to:  Does not radiate Pain severity:  Moderate Onset quality:  Gradual Duration:  5 days Timing:  Intermittent Progression:  Waxing and waning Chronicity:  Recurrent Context: previous surgery   Associated symptoms: chest pain, fatigue and shortness of breath   Associated symptoms: no chills, no constipation, no cough, no diarrhea, no dysuria, no fever, no nausea and no vomiting     Past Medical History:  Diagnosis Date  . Anginal pain ([place])   . Arthritis   . Complication of anesthesia   . Coronary artery disease   . Dyspnea    exertion  . Hypertension   . Hypothyroidism   . PAF (paroxysmal atrial fibrillation) ([place])   . PONV (postoperative nausea and vomiting)   . Small bowel obstruction ([place]) 06/2015  . Stroke [facility])    oct 2016  . TIA (transient ischemic attack) 03/24/2015  . Vertigo, benign positional    to be evaluated,  intermittent    Patient Active Problem List   Diagnosis Date Noted  . Hyperlipidemia 11/27/2016  . Coronary artery disease with exertional angina ([place]) 09/27/2016  . Bilateral inguinal hernia 03/10/2016  . Bilateral inguinal hernia without obstruction or gangrene 03/07/2016  . Incisional hernia, without obstruction or gangrene 03/07/2016  . Atrial fibrillation ([place]) 06/21/2015  . TIA (transient ischemic attack) 03/24/2015  . CAD S/P percutaneous coronary angioplasty 12/26/2014  . Dyslipidemia 12/26/2014  . COPD (chronic obstructive pulmonary disease) ([place]) 12/26/2014  . Hypothyroidism 12/26/2014  . Cerebral embolism with cerebral infarction 12/25/2014  . Unstable angina ([place]) 12/24/2014    Past Surgical History:  Procedure Laterality Date  . ABDOMINAL HYSTERECTOMY    . CARDIAC CATHETERIZATION N/A 12/24/2014   Procedure: Left Heart Cath and Coronary Angiography;  Surgeon: Sherren Mocha, MD;  Location: [place] [facility];  Service: Cardiovascular;  Laterality: N/A;  . CATARACT EXTRACTION Bilateral   . COLONOSCOPY WITH PROPOFOL N/A 12/01/2013   Procedure: COLONOSCOPY WITH PROPOFOL;  Surgeon: Garlan Fair, MD;  Location: [facility] ENDOS[facility];  Service: Endoscopy;  Laterality: N/A;  . INCISIONAL HERNIA REPAIR N/A 03/10/2016   Procedure: INCISIONAL HERNIA REPAIR TIMES TWO;  Surgeon: Armandina Gemma, MD;  Location: [place];  Service: General;  Laterality: N/A;  . INGUINAL HERNIA REPAIR Bilateral 03/10/2016   Procedure: BILATERAL INGUINAL HERNIA REPAIRS;  Surgeon: Armandina Gemma, MD;  Location: [place];  Service: General;  Laterality: Bilateral;  . INSERTION OF MESH N/A 03/10/2016   Procedure: INSERTION OF MESH TO BILATERAL GROINS AND  ABDOMEN;  Surgeon: Armandina Gemma, MD;  Location: [place];  Service: General;  Laterality: N/A;  . laparotomy  06/2015  . LEFT HEART CATH AND CORONARY ANGIOGRAPHY N/A 09/27/2016   Procedure: Left Heart Cath and Coronary Angiography;  Surgeon: Sherren Mocha, MD;  Location: [place] CV LAB;  Service: Cardiovascular;  Laterality: N/A;  . TONSILLECTOMY       OB History   No obstetric history on file.      Home Medications    Prior to Admission medications   Medication Sig Start Date End Date Taking? Authorizing Provider  acetaminophen (TYLENOL) 500 MG tablet Take 500 mg by mouth every 6 (six) hours as needed for mild pain.     [provider]  amLODipine (NORVASC) 5 MG tablet TAKE 1 TABLET BY MOUTH ONCE DAILY 11/13/17   Nahser, Wonda Cheng, MD  aspirin EC 81 MG tablet Take 1 tablet (81 mg total) by mouth daily. 12/14/15   Nahser, Wonda Cheng, MD  Calcium Carb-Cholecalciferol (CALCIUM 600 + D PO) Take 1 tablet by mouth daily at 12 noon.     [provider]  carvedilol (COREG) 6.25 MG tablet Take 1 tablet (6.25 mg total) by mouth 2 (two) times daily. 05/23/17   Nahser, Wonda Cheng, MD  cholecalciferol (VITAMIN D) 1000 UNITS tablet Take 2,000 Units by mouth daily.     [provider]  hydrochlorothiazide (MICROZIDE) 12.5 MG capsule Take 12.5 mg by mouth daily. 11/09/17   [provider]  levothyroxine (SYNTHROID, LEVOTHROID) 75 MCG tablet Take 75 mcg by mouth daily before breakfast.    [provider]  NITROSTAT 0.4 MG SL tablet Place 1 tablet (0.4 mg total) under the tongue every 5 (five) minutes as needed. Chest pain 09/22/15   Nahser, Wonda Cheng, MD  Polyethyl Glycol-Propyl Glycol (SYSTANE ULTRA) 0.4-0.3 % SOLN Place 1-2 drops into both eyes 3 (three) times daily as needed (dry eyes).    [provider]  rosuvastatin (CRESTOR) 10 MG tablet Take 1 tablet (10 mg total) by mouth daily. 11/21/17 11/16/18  Nahser, Wonda Cheng, MD    Family History Family History  Problem Relation Age of Onset  . Heart attack Father   . Stroke Mother   . Cancer Sister        breast    Social History Social History   Tobacco Use  . Smoking status: Never Smoker  . Smokeless tobacco: Never Used  Substance Use Topics  . Alcohol use: No     Comment: occ.  . Drug use: No     Allergies   Anesthesia s-i-60   Review of Systems Review of Systems  Constitutional: Positive for fatigue. Negative for chills, diaphoresis and fever.  HENT: Negative for congestion.   Eyes: Negative for visual disturbance.  Respiratory: Positive for shortness of breath. Negative for cough, chest tightness, wheezing and stridor.   Cardiovascular: Positive for chest pain and palpitations. Negative for leg swelling and near-syncope.  Gastrointestinal: Positive for abdominal pain. Negative for constipation, diarrhea, heartburn, nausea and vomiting.  Genitourinary: Negative for dysuria, flank pain and frequency.  Musculoskeletal: Negative for back pain, neck pain and neck stiffness.  Skin: Negative for rash and wound.  Neurological: Negative for syncope, weakness, light-headedness, numbness and headaches.  Psychiatric/Behavioral: Negative for agitation.  All other systems reviewed and are negative.    Physical Exam Updated Vital Signs BP 120/89   Pulse 94   Temp 97.9 F (36.6 C) (Oral)   Resp (!) 21  SpO2 (!) 85%   Physical Exam Vitals signs and nursing note reviewed.  Constitutional:      General: She is not in acute distress.    Appearance: She is well-developed. She is not ill-appearing, toxic-appearing or diaphoretic.  HENT:     Head: Normocephalic and atraumatic.     Right Ear: External ear normal.     Left Ear: External ear normal.     Nose: Nose normal. No congestion or rhinorrhea.     Mouth/Throat:     Pharynx: No oropharyngeal exudate or posterior oropharyngeal erythema.  Eyes:     Conjunctiva/sclera: Conjunctivae normal.     Pupils: Pupils are equal, round, and reactive to light.  Neck:     Musculoskeletal: Normal range of motion and neck supple. No muscular tenderness.  Cardiovascular:     Rate and Rhythm: Tachycardia present. Rhythm irregular.  Pulmonary:     Effort: No respiratory distress.     Breath sounds: No  stridor. No wheezing, rhonchi or rales.  Chest:     Chest wall: No tenderness.  Abdominal:     General: Abdomen is flat. There is no distension.     Tenderness: There is no abdominal tenderness. There is no rebound.  Musculoskeletal:        General: No tenderness.     Right lower leg: No edema.     Left lower leg: No edema.  Skin:    General: Skin is warm.     Capillary Refill: Capillary refill takes less than 2 seconds.     Findings: No erythema or rash.  Neurological:     General: No focal deficit present.     Mental Status: She is alert and oriented to person, place, and time.     Sensory: No sensory deficit.     Motor: No weakness or abnormal muscle tone.     Deep Tendon Reflexes: Reflexes are normal and symmetric.  Psychiatric:        Mood and Affect: Mood normal.      ED Treatments / Results  Labs (all labs ordered are listed, but only abnormal results are displayed) Labs Reviewed  CBC WITH DIFFERENTIAL/PLATELET - Abnormal; Notable for the following components:      Result Value   Neutro Abs 8.2 (*)    All other components within normal limits  COMPREHENSIVE METABOLIC PANEL - Abnormal; Notable for the following components:   Sodium 133 (*)    Glucose, Bld 105 (*)    AST 48 (*)    Total Bilirubin 1.7 (*)    All other components within normal limits  URINALYSIS, ROUTINE W REFLEX MICROSCOPIC - Abnormal; Notable for the following components:   Color, Urine STRAW (*)    Specific Gravity, Urine 1.003 (*)    Ketones, ur 5 (*)    All other components within normal limits  URINE CULTURE  LIPASE, BLOOD  LACTIC ACID, PLASMA  LACTIC ACID, PLASMA  TSH  TROPONIN I  TROPONIN I  TROPONIN I  TSH  I-STAT TROPONIN, ED  I-STAT TROPONIN, ED    EKG EKG Interpretation  Date/Time:  Sunday April 21 2018 07:07:29 EST Ventricular Rate:  150 PR Interval:    QRS Duration: 85 QT Interval:  329 QTC Calculation: 520 R Axis:   69 Text Interpretation:  Atrial fibrillation  with rapid V-rate Ventricular tachycardia, unsustained Anteroseptal infarct, age indeterminate When compared to prior, new Afib with RVR.  No STEMI Confirmed by Antony Blackbird [phone]) on 04/21/2018 7:18:25 AM  Radiology Ct Abdomen Pelvis W Contrast  Result Date: 04/21/2018 CLINICAL DATA:  Diffuse abdominal pain since last night. Pain slightly worse on the left side. History of small-bowel obstruction. EXAM: CT ABDOMEN AND PELVIS WITH CONTRAST TECHNIQUE: Multidetector CT imaging of the abdomen and pelvis was performed using the standard protocol following bolus administration of intravenous contrast. CONTRAST:  183mL OMNIPAQUE IOHEXOL 300 MG/ML  SOLN COMPARISON:  Radiographs 07/22/2015 FINDINGS: Lower chest: Mild dependent atelectasis at both lung bases superimposed on mild underlying emphysematous changes. There is a small calcified granuloma anteriorly in the right lower lobe. No significant pleural or pericardial effusion. There is a small hiatal hernia. Hepatobiliary: The liver is normal in density without suspicious focal abnormality. No evidence of gallstones, gallbladder wall thickening or biliary dilatation. Pancreas: Unremarkable. No pancreatic ductal dilatation or surrounding inflammatory changes. Spleen: Normal in size without focal abnormality. Adrenals/Urinary Tract: Both adrenal glands appear normal. There is a large wedge-shaped area of decreased enhancement anterolaterally in the lower pole of the right kidney, suspicious for an infarct. There is another smaller area in the interpolar region of the right kidney. The left kidney appears normal. There is bilateral excretion of contrast and no hydronephrosis or urinary tract calculus. The bladder is distended but otherwise appears normal. Stomach/Bowel: No evidence of bowel wall thickening, distention or surrounding inflammatory change. The appendix is not clearly visualized. There is no pericecal inflammation. There is prominent stool in the  distal colon mild sigmoid diverticulosis. Vascular/Lymphatic: There are no enlarged abdominal or pelvic lymph nodes. Aortic and branch vessel atherosclerosis. The renal arteries are patent bilaterally. There is ostial stenosis at the celiac trunk. The superior and inferior mesenteric arteries are patent. The portal, superior mesenteric and renal veins are patent. Reproductive: Hysterectomy. Possible residual ovarian tissue bilaterally. No adnexal mass. Other: Postsurgical changes in the anterior abdominal wall with small infraumbilical hernia containing only fat. No ascites. Musculoskeletal: No acute or significant osseous findings. Superior endplate compression deformity at L1 appears chronic. There are degenerative changes throughout the lumbar spine associated with a convex left scoliosis. IMPRESSION: 1. Probable acute/subacute right renal infarcts. Mass lesion on likely. Recommend follow-up CT in 2-3 months. 2. No other acute findings. No evidence of bowel obstruction or bowel ischemia. 3. Aortic and branch vessel atherosclerosis. The renal arteries and veins are patent. There is ostial stenosis of the celiac trunk. 4. Electronically Signed   By: Richardean Sale M.D.   On: 04/21/2018 10:56   Dg Chest Portable 1 View  Result Date: 04/21/2018 CLINICAL DATA:  Chest pain EXAM: PORTABLE CHEST 1 VIEW COMPARISON:  December 24, 2014 FINDINGS: No edema or consolidation. Heart is upper normal in size with pulmonary vascularity normal. No adenopathy. No bone lesions. No pneumothorax. IMPRESSION: No edema or consolidation.  Heart upper normal in size. Electronically Signed   By: Lowella Grip III M.D.   On: 04/21/2018 09:11    Procedures Procedures (including critical care time)  CRITICAL CARE Performed by: Gwenyth Allegra Tegeler Total critical care time: 35 minutes Critical care time was exclusive of separately billable procedures and treating other patients. Critical care was necessary to treat or prevent  imminent or life-threatening deterioration. Critical care was time spent personally by me on the following activities: development of treatment plan with patient and/or surrogate as well as nursing, discussions with consultants, evaluation of patient's response to treatment, examination of patient, obtaining history from patient or surrogate, ordering and performing treatments and interventions, ordering and review of laboratory studies, ordering and review  of radiographic studies, pulse oximetry and re-evaluation of patient's condition.   Medications Ordered in ED Medications  apixaban (ELIQUIS) tablet 10 mg (10 mg Oral Given 04/21/18 1309)    Followed by  apixaban (ELIQUIS) tablet 5 mg (has no administration in time range)  aspirin EC tablet 81 mg (has no administration in time range)  carvedilol (COREG) tablet 6.25 mg (has no administration in time range)  rosuvastatin (CRESTOR) tablet 10 mg (has no administration in time range)  levothyroxine (SYNTHROID, LEVOTHROID) tablet 75 mcg (has no administration in time range)  acetaminophen (TYLENOL) tablet 650 mg (has no administration in time range)  ondansetron (ZOFRAN) injection 4 mg (has no administration in time range)  diltiazem (CARDIZEM) 100 mg in dextrose 5% 136mL (1 mg/mL) infusion (15 mg/hr Intravenous New Bag/Given 04/21/18 1503)  sodium chloride 0.9 % bolus 1,000 mL (0 mLs Intravenous Stopped 04/21/18 0841)  iohexol (OMNIPAQUE) 300 MG/ML solution 100 mL (100 mLs Intravenous Contrast Given 04/21/18 1007)     Initial Impression / Assessment and Plan / ED Course  I have reviewed the triage vital signs and the nursing notes.  Pertinent labs & imaging results that were available during my care of the patient were reviewed by me and considered in my medical decision making (see chart for details).     Rachel Park is a 85 y.o. female with a past medical history significant for CAD status post PCI, TIA/stroke, prior atrial fibrillation not on  anticoagulation, prior small bowel structure and status post abdominal surgery, hypertension, arthritis, and vertigo who presents with chest pressure, abdominal pain, palpitations, fatigue, shortness of breath, lightheadedness.  Patient is accompanied by her son, Dr. Donnetta Hutching who is 1 of the vascular surgeons this facility.  Patient reports that for the last 5 days, she has has intermittent chest pressure.  She reports that she had shortness of breath.  It is worse with exertion.  She reports he typically walks in her neighborhood in the mornings and has been having difficulty with this due to the discomfort.  She also reports she is been having waxing and waning abdominal pain.  She reports it was hard and distended yesterday coming and going.  She does report she has very small bowel movements and is continued to do so but she is unsure about change in flatus amount.  She reports no urinary symptoms.  Denies trauma.  Reports feeling lightheaded and having palpitations.  Patient's heart rate was found to be in the 140s with A. fib with RVR on arrival.  She denies any nausea, vomiting, diaphoresis, or radiation of her discomfort.  She reports that the exertional symptoms in her chest feels similar to the discomfort she had when she required PCI.  On exam, lungs are clear.  Chest is nontender.  Abdomen is nontender.  Abdomen is not hard or distended.  Breath sounds equal bilaterally.  Mild systolic murmur.  Patient has palpable pulses in lower extremities.  Minimal lower extremity edema bilaterally.  No focal neurologic deficit initial exam.  Patient is tachycardic in the 130s and 140s with A. fib.  EKG shows A. fib with RVR.  No STEMI.  Clinically I am concerned about several things.  I am concerned the patient's A. fib with RVR is the cause of symptoms.  Patient's son reports that she went into A. fib in the context of her prior bowel obstruction and he suspect she had this during her prior CAD with PCI.  As  patient reports she is having  the chest discomfort similar to prior cardiac ischemia requiring stenting, patient will have troponin, chest x-ray, and cardiology will be called.  Patient will have CT of abdomen pelvis to look for obstruction given history and similar symptoms.  Patient will be given fluids and will also be started on a diltiazem drip for the rate control.  Anticipate admission.  Cardiology will be called for their involvement initially.  Cardiology saw the patient and will monitor.  They agreed with diltiazem drip for rate control.  CT scan returned showing evidence of renal infarct.  Suspect is causing the patient's abdominal and potentially her chest pain.  Patient will be started on heparin.  Hospitalist team will admit for further management.  Other laboratory testing was overall reassuring.  No evidence of pneumonia.  Urinalysis shows no infection.  Lactic acid normal.  Troponin negative.  TSH normal.  Kidney function is normal.  Suspect patient's intermittent atrial fibrillation led to blood clots and thromboembolism to cause the renal infarct.  Patient denies any neurologic complaints and has no neurologic deficits on my exam.  Will defer to admitting team for further neurologic work-up to look for any stroke.  Patient be admitted for further management to hospitalist service.  Patient's son agreed with plan of care.   Final Clinical Impressions(s) / ED Diagnoses   Final diagnoses:  Renal infarct ([place])  Chest pain, unspecified type  Shortness of breath    ED Discharge Orders    None      Clinical Impression: 1. Renal infarct ([place])   2. Chest pain   3. Chest pain, unspecified type   4. Shortness of breath     Disposition: Admit  This note was prepared with assistance of Dragon voice recognition software. Occasional wrong-word or sound-a-like substitutions may have occurred due to the inherent limitations of voice recognition software.     Tegeler,  Gwenyth Allegra, MD 04/21/18 [phone]

## 2018-04-21 NOTE — Progress Notes (Signed)
  Echocardiogram 2D Echocardiogram has been performed.  Darlina Sicilian M 04/21/2018, 10:04 AM

## 2018-04-21 NOTE — Progress Notes (Signed)
ANTICOAGULATION CONSULT NOTE - Initial Consult  Pharmacy Consult for Eliquis Indication: atrial fibrillation, Renal infarct   Allergies  Allergen Reactions  . Anesthesia S-I-60 Nausea And Vomiting    "with hysterectomy years back had post op N/V"    Patient Measurements:    Vital Signs: Temp: 97.9 F (36.6 C) (02/16 0708) Temp Source: Oral (02/16 0708) BP: 120/89 (02/16 1100) Pulse Rate: 94 (02/16 1100)  Labs: Recent Labs    04/21/18 0720 04/21/18 0920  HGB 13.5  --   HCT 40.6  --   PLT 234  --   CREATININE 0.57  --   TROPONINI  --  <0.03    CrCl cannot be calculated (Unknown ideal weight.).   Medical History: Past Medical History:  Diagnosis Date  . Anginal pain ([place])   . Arthritis   . Complication of anesthesia   . Coronary artery disease   . Dyspnea    exertion  . Hypertension   . Hypothyroidism   . PAF (paroxysmal atrial fibrillation) ([place])   . PONV (postoperative nausea and vomiting)   . Small bowel obstruction ([place]) 06/2015  . Stroke [facility])    oct 2016  . TIA (transient ischemic attack) 03/24/2015  . Vertigo, benign positional    to be evaluated, intermittent    Assessment: 86 YOF with hx PAF and is no longer on anticoagulation PTA (had been on Eliquis), now in afib, also CT reveals renal infarcts.  Pharmacy consulted to start Eliquis.  CBC wnl.  Goal of Therapy:  Monitor platelets by anticoagulation protocol: Yes   Plan:  Eliquis 10mg  PO BID x 7 days, then 5mg  PO BID thereafter Monitor s/s bleeding, CBC  Bertis Ruddy, PharmD Clinical Pharmacist Please check AMION for all [place] numbers 04/21/2018 11:21 AM

## 2018-04-21 NOTE — Consult Note (Addendum)
Cardiology Consultation:   Patient ID: Rachel Park MRN: [id number]; DOB: [birth year]  Admit date: 04/21/2018 Date of Consult: 04/21/2018  Primary Care Provider: Lajean Manes, MD Primary Cardiologist: Rachel Moores, MD   Primary Electrophysiologist:  None     Patient Profile:   Rachel Park is a 85 y.o. female with a hx of CAD who is being seen today for the evaluation of chest pain and rapid afib  at the request of Rachel Park.  .  History of Present Illness:   Rachel Park 85 y.o. who has had epigastric pain for the past week or so She has a history of bowel perforation with complicated surgery in [place] 2017. Small bowel perforation and subsequent hernia surgery by Rachel Rachel Park. Since than has always had sensation of tightness in her chest and difficulty taking deep breath. History of CAD Single vessel with stenting of proximal LAD 12/24/14  Last cath 09/27/16 with widely patent stent and no residual disease in RCA/Circumflex. EF has been normal Also history of PAF. She notes over the last few months she will occassionally have rapid heart rates but they will return to her normal rate around 70 after a while She had been on Eliquis but no longer on anticoagulation No fever nausea vomiting or change in bowel habits Tightness in chest not exertional No cough Denies palpitations unless asked. No dyspnea.  ECG 05/24/27 was NSR.   This patients CHA2DS2-VASc Score and unadjusted Ischemic Stroke Rate (% per year) is equal to 3.2 % stroke rate/year from a score of 3  Above score calculated as 1 point each if present [CHF, HTN, DM, Vascular=MI/PAD/Aortic Plaque, Age if 65-74, or Female] Above score calculated as 2 points each if present [Age > 75, or Stroke/TIA/TE]  This does not include stroke suffered during PCI/Stent procedure in 2016   Past Medical History:  Diagnosis Date  . Anginal pain ([place])   . Arthritis   . Complication of anesthesia   . Coronary artery disease   . Dyspnea    exertion    . Hypertension   . Hypothyroidism   . PAF (paroxysmal atrial fibrillation) ([place])   . PONV (postoperative nausea and vomiting)   . Small bowel obstruction ([place]) 06/2015  . Stroke [facility])    oct 2016  . TIA (transient ischemic attack) 03/24/2015  . Vertigo, benign positional    to be evaluated, intermittent    Past Surgical History:  Procedure Laterality Date  . ABDOMINAL HYSTERECTOMY    . CARDIAC CATHETERIZATION N/A 12/24/2014   Procedure: Left Heart Cath and Coronary Angiography;  Surgeon: Rachel Mocha, MD;  Location: [place] [facility];  Service: Cardiovascular;  Laterality: N/A;  . CATARACT EXTRACTION Bilateral   . COLONOSCOPY WITH PROPOFOL N/A 12/01/2013   Procedure: COLONOSCOPY WITH PROPOFOL;  Surgeon: Rachel Fair, MD;  [facility];  Service: Endoscopy;  Laterality: N/A;  . INCISIONAL HERNIA REPAIR N/A 03/10/2016   Procedure: INCISIONAL HERNIA REPAIR TIMES TWO;  Surgeon: Rachel Gemma, MD;  Location: [place];  Service: General;  Laterality: N/A;  . INGUINAL HERNIA REPAIR Bilateral 03/10/2016   Procedure: BILATERAL INGUINAL HERNIA REPAIRS;  Surgeon: Rachel Gemma, MD;  Location: [place];  Service: General;  Laterality: Bilateral;  . INSERTION OF MESH N/A 03/10/2016   Procedure: INSERTION OF MESH TO BILATERAL GROINS AND ABDOMEN;  Surgeon: Rachel Gemma, MD;  Location: [place];  Service: General;  Laterality: N/A;  . laparotomy  06/2015  . LEFT HEART CATH AND CORONARY ANGIOGRAPHY N/A 09/27/2016  Procedure: Left Heart Cath and Coronary Angiography;  Surgeon: Rachel Mocha, MD;  Location: [place] [facility];  Service: Cardiovascular;  Laterality: N/A;  . TONSILLECTOMY       Home Medications:  Prior to Admission medications   Medication Sig Start Date End Date Taking? Authorizing Provider  acetaminophen (TYLENOL) 500 MG tablet Take 500 mg by mouth every 6 (six) hours as needed for mild pain.     [provider]  amLODipine (NORVASC) 5 MG tablet TAKE 1 TABLET BY MOUTH ONCE  DAILY 11/13/17   Rachel Park, Rachel Cheng, MD  aspirin EC 81 MG tablet Take 1 tablet (81 mg total) by mouth daily. 12/14/15   Rachel Park, Rachel Cheng, MD  Calcium Carb-Cholecalciferol (CALCIUM 600 + D PO) Take 1 tablet by mouth daily at 12 noon.     [provider]  carvedilol (COREG) 6.25 MG tablet Take 1 tablet (6.25 mg total) by mouth 2 (two) times daily. 05/23/17   Rachel Park, Rachel Cheng, MD  cholecalciferol (VITAMIN D) 1000 UNITS tablet Take 2,000 Units by mouth daily.     [provider]  hydrochlorothiazide (MICROZIDE) 12.5 MG capsule Take 12.5 mg by mouth daily. 11/09/17   [provider]  levothyroxine (SYNTHROID, LEVOTHROID) 75 MCG tablet Take 75 mcg by mouth daily before breakfast.    [provider]  NITROSTAT 0.4 MG SL tablet Place 1 tablet (0.4 mg total) under the tongue every 5 (five) minutes as needed. Chest pain 09/22/15   Rachel Park, Rachel Cheng, MD  Polyethyl Glycol-Propyl Glycol (SYSTANE ULTRA) 0.4-0.3 % SOLN Place 1-2 drops into both eyes 3 (three) times daily as needed (dry eyes).    [provider]  rosuvastatin (CRESTOR) 10 MG tablet Take 1 tablet (10 mg total) by mouth daily. 11/21/17 11/16/18  Rachel Park, Rachel Cheng, MD    Inpatient Medications: Scheduled Meds:  Continuous Infusions: . diltiazem (CARDIZEM) infusion 5 mg/hr (04/21/18 0739)  . sodium chloride 1,000 mL (04/21/18 0740)   PRN Meds:   Allergies:    Allergies  Allergen Reactions  . Anesthesia S-I-60 Nausea And Vomiting    "with hysterectomy years back had post op N/V"    Social History:   Social History   Socioeconomic History  . Marital status: Divorced    Spouse name: Not on file  . Number of children: 2  . Years of education: HS  . Highest education level: Not on file  Occupational History  . Occupation: retired  Scientific laboratory technician  . Financial resource strain: Not on file  . Food insecurity:    Worry: Not on file    Inability: Not on file  . Transportation needs:    Medical: Not on  file    Non-medical: Not on file  Tobacco Use  . Smoking status: Never Smoker  . Smokeless tobacco: Never Used  Substance and Sexual Activity  . Alcohol use: No    Comment: occ.  . Drug use: No  . Sexual activity: Not on file  Lifestyle  . Physical activity:    Days per week: Not on file    Minutes per session: Not on file  . Stress: Not on file  Relationships  . Social connections:    Talks on phone: Not on file    Gets together: Not on file    Attends religious service: Not on file    Active member of club or organization: Not on file    Attends meetings of clubs or organizations: Not on file  Relationship status: Not on file  . Intimate partner violence:    Fear of current or ex partner: Not on file    Emotionally abused: Not on file    Physically abused: Not on file    Forced sexual activity: Not on file  Other Topics Concern  . Not on file  Social History Narrative   Patient drinks 3-4 cups of caffeine daily.   Patient is right handed.     Family History:    Family History  Problem Relation Age of Onset  . Heart attack Father   . Stroke Mother   . Cancer Sister        breast     ROS:  Please see the history of present illness.   All other ROS reviewed and negative.     Physical Exam/Data:   Vitals:   04/21/18 0708 04/21/18 0730  BP: (!) 129/104 119/87  Pulse: (!) 126 (!) 34  Resp: 16 19  Temp: 97.9 F (36.6 C)   TempSrc: Oral   SpO2: 97% 98%   No intake or output data in the 24 hours ending 04/21/18 0806 Last 3 Weights 11/20/2017 05/23/2017 11/27/2016  Weight (lbs) 140 lb 136 lb 139 lb 12.8 oz  Weight (kg) 63.504 kg 61.689 kg 63.413 kg     There is no height or weight on file to calculate BMI.  General:  Well nourished, well developed, in no acute distress  HEENT: normal Lymph: no adenopathy Neck: no JVD Endocrine:  No thryomegaly Vascular: No carotid bruits; FA pulses 2+ bilaterally without bruits  Cardiac:  normal S1, S2; RRR; no murmur     Lungs:  clear to auscultation bilaterally, no wheezing, rhonchi or rales  Abd: post hernia surgery epigastric pain to palpation no rebound  Ext: no edema Musculoskeletal:  No deformities, BUE and BLE strength normal and equal Skin: warm and dry  Neuro:  CNs 2-12 intact, no focal abnormalities noted Psych:  Normal affect   EKG:  The EKG was personally reviewed and demonstrates:  Afib no acute ST changes rapid rate  Telemetry:  Telemetry was personally reviewed and demonstrates:  afib rates 1--=140  Relevant CV Studies: Cath June 2018 widely patent LAD stent   Laboratory Data:  ChemistryNo results for input(s): NA, K, CL, CO2, GLUCOSE, BUN, CREATININE, CALCIUM, GFRNONAA, GFRAA, ANIONGAP in the last 168 hours.  No results for input(s): PROT, ALBUMIN, AST, ALT, ALKPHOS, BILITOT in the last 168 hours. HematologyNo results for input(s): WBC, RBC, HGB, HCT, MCV, MCH, MCHC, RDW, PLT in the last 168 hours. Cardiac EnzymesNo results for input(s): TROPONINI in the last 168 hours. No results for input(s): TROPIPOC in the last 168 hours.  BNPNo results for input(s): BNP, PROBNP in the last 168 hours.  DDimer No results for input(s): DDIMER in the last 168 hours.  Radiology/Studies:  No results found.  Assessment and Plan:   1. PAF:  Agree with cardizem drip for rate control and add oral lopressor 12.5 PO bid D/C amlodipine Will start heparin once Abdomen cleared with CT.  TTE to assess atrial size and EF 2. Chest Pain:  Doubt angina rate control afib will need heparin for afib anyway. TTE to r/o RWMA;s cycle troponin and follow ECG 3. GI:  Epigastric pain with complicated history of bowel perforation and hernia repair. CT abdomen pending doubt embolic to gut Lactate pending  4. Thyroid:  Continue synthroid replacement check TSH in light of PAF. 5. HLD:  With CAD continue statin target  LDL 70 or less      For questions or updates, please contact [place] Please consult [email or web address]  for contact info under     Signed, Jenkins Rouge, MD  04/21/2018 8:06 AM

## 2018-04-22 ENCOUNTER — Encounter (HOSPITAL_COMMUNITY): Payer: Self-pay | Admitting: General Practice

## 2018-04-22 DIAGNOSIS — J449 Chronic obstructive pulmonary disease, unspecified: Secondary | ICD-10-CM

## 2018-04-22 DIAGNOSIS — E039 Hypothyroidism, unspecified: Secondary | ICD-10-CM

## 2018-04-22 DIAGNOSIS — N280 Ischemia and infarction of kidney: Secondary | ICD-10-CM

## 2018-04-22 LAB — URINE CULTURE: Culture: NO GROWTH

## 2018-04-22 MED ORDER — APIXABAN 5 MG PO TABS
5.0000 mg | ORAL_TABLET | Freq: Two times a day (BID) | ORAL | Status: DC
  Administered 2018-04-22 – 2018-04-24 (×5): 5 mg via ORAL
  Filled 2018-04-22 (×5): qty 1

## 2018-04-22 MED ORDER — DILTIAZEM HCL 60 MG PO TABS
30.0000 mg | ORAL_TABLET | Freq: Four times a day (QID) | ORAL | Status: DC
  Administered 2018-04-22 – 2018-04-24 (×9): 30 mg via ORAL
  Filled 2018-04-22 (×8): qty 1

## 2018-04-22 NOTE — Plan of Care (Signed)

## 2018-04-22 NOTE — Progress Notes (Signed)
History and Physical    Rachel Park CBJ:[id number] DOB: September 10, 1931 DOA: 04/21/2018  I have briefly reviewed the patient's prior medical records in [place]  PCP: Rachel Manes, MD   Chief Complaint: CP, abdominal pain   HPI: Rachel Park is a 85 y.o. female with medical history significant of CVA, PAF, HTN, hypothyroidism, and CAD s/p stenting who is being seen today for evaluation of substernal chest pain and increasing SOB secondary to AFib with RVR. Rachel Park first noticed symptoms of Afib s/p a complicated abdominal surgery back in 2017 which she states resolved on its own. She reports monitoring her BP and HR at home daily, and states over the past few months she has noticed gradually increasing heart rates that later return to her normal rate in the 70s. She typically goes for walks around her neighborhood without difficulty, however states she has had an increasing difficulty breathing while walking up hills. Patient states she decided to come to the ED last night after experiencing significant weakness and chest tightness. Symptoms are accompanied by intermittent palpitations, lightheadedness, dizziness, and fatigue. She denies fever, chills, nausea, vomiting, diaphoresis, HA, cough, changes in bowel movements, lower extremity numbness or tingling.   Review of Systems: As per HPI; otherwise negative.   Past Medical History:  Diagnosis Date  . Anginal pain ([place])   . Arthritis   . Complication of anesthesia   . Coronary artery disease   . Dyspnea    exertion  . Hypertension   . Hypothyroidism   . PAF (paroxysmal atrial fibrillation) ([place])    on Xarelto for maybe a year, opted to stop  . PONV (postoperative nausea and vomiting)   . Renal infarction ([place])   . Small bowel obstruction ([place]) 06/2015  . Stroke [facility])    oct 2016  . TIA (transient ischemic attack) 03/24/2015  . Vertigo, benign positional    to be evaluated, intermittent    Past Surgical History:  Procedure  Laterality Date  . ABDOMINAL HYSTERECTOMY    . CARDIAC CATHETERIZATION N/A 12/24/2014   Procedure: Left Heart Cath and Coronary Angiography;  Surgeon: Sherren Mocha, MD;  Location: [place] [facility];  Service: Cardiovascular;  Laterality: N/A;  . CATARACT EXTRACTION Bilateral   . COLONOSCOPY WITH PROPOFOL N/A 12/01/2013   Procedure: COLONOSCOPY WITH PROPOFOL;  Surgeon: Garlan Fair, MD;  [facility];  Service: Endoscopy;  Laterality: N/A;  . INCISIONAL HERNIA REPAIR N/A 03/10/2016   Procedure: INCISIONAL HERNIA REPAIR TIMES TWO;  Surgeon: Armandina Gemma, MD;  Location: [place];  Service: General;  Laterality: N/A;  . INGUINAL HERNIA REPAIR Bilateral 03/10/2016   Procedure: BILATERAL INGUINAL HERNIA REPAIRS;  Surgeon: Armandina Gemma, MD;  Location: [place];  Service: General;  Laterality: Bilateral;  . INSERTION OF MESH N/A 03/10/2016   Procedure: INSERTION OF MESH TO BILATERAL GROINS AND ABDOMEN;  Surgeon: Armandina Gemma, MD;  Location: [place];  Service: General;  Laterality: N/A;  . laparotomy  06/2015  . LEFT HEART CATH AND CORONARY ANGIOGRAPHY N/A 09/27/2016   Procedure: Left Heart Cath and Coronary Angiography;  Surgeon: Sherren Mocha, MD;  Location: [place] [facility];  Service: Cardiovascular;  Laterality: N/A;  . TONSILLECTOMY       reports that she has never smoked. She has never used smokeless tobacco. She reports that she does not drink alcohol or use drugs.  Allergies  Allergen Reactions  . Anesthesia S-I-60 Nausea And Vomiting    "with hysterectomy years back had post op  N/V"    Family History  Problem Relation Age of Onset  . Heart attack Father   . Stroke Mother   . Cancer Sister        breast    Prior to Admission medications   Medication Sig Start Date End Date Taking? Authorizing Provider  acetaminophen (TYLENOL) 500 MG tablet Take 500 mg by mouth every 6 (six) hours as needed for mild pain.    Yes [provider]  amLODipine (NORVASC) 5 MG tablet TAKE 1  TABLET BY MOUTH ONCE DAILY Patient taking differently: Take 5 mg by mouth daily.  11/13/17  Yes Nahser, Wonda Cheng, MD  aspirin EC 81 MG tablet Take 1 tablet (81 mg total) by mouth daily. 12/14/15  Yes Nahser, Wonda Cheng, MD  carvedilol (COREG) 6.25 MG tablet Take 1 tablet (6.25 mg total) by mouth 2 (two) times daily. 05/23/17  Yes Nahser, Wonda Cheng, MD  cholecalciferol (VITAMIN D) 1000 UNITS tablet Take 2,000 Units by mouth daily.    Yes [provider]  hydrochlorothiazide (MICROZIDE) 12.5 MG capsule Take 12.5 mg by mouth daily. 11/09/17  Yes [provider]  levothyroxine (SYNTHROID, LEVOTHROID) 75 MCG tablet Take 75 mcg by mouth daily before breakfast.   Yes [provider]  NITROSTAT 0.4 MG SL tablet Place 1 tablet (0.4 mg total) under the tongue every 5 (five) minutes as needed. Chest pain 09/22/15  Yes Nahser, Wonda Cheng, MD  Polyethyl Glycol-Propyl Glycol (SYSTANE ULTRA) 0.4-0.3 % SOLN Place 1-2 drops into both eyes 3 (three) times daily as needed (dry eyes).   Yes [provider]  rosuvastatin (CRESTOR) 10 MG tablet Take 1 tablet (10 mg total) by mouth daily. 11/21/17 11/16/18 Yes Nahser, Wonda Cheng, MD    Physical Exam: Vitals:   04/22/18 0744 04/22/18 0935 04/22/18 1231 04/22/18 1427  BP: 99/62 93/63 (!) 99/47 103/66  Pulse: 90  88   Resp: 17     Temp: 98.4 F (36.9 C)  98.6 F (37 C)   TempSrc: Oral  Oral   SpO2: 98%  96%   Weight:        Constitutional: Alert, interactive, appears comfortable. HEENT: Normal. Neck: supple, no LAD.  Respiratory: clear to auscultation bilaterally, no wheezing, rhonchi or rales. Normal respiratory effort. No accessory muscle use.  Cardiovascular: irregular rhythm consistent with Afib. Regular rate. No murmurs, rubs or gallops. No lower extremity edema. Distal pulses 2+ bilaterally. Abdomen: post surgical tenderness to palpation over the periumbilical and epigastric regions. No palpable masses. Bowel sounds positive.   Musculoskeletal: normal muscle tone.  Skin: warm and dry. Psychiatric: normal affect.  Laboratory Data:  CBC: Recent Labs  Lab 04/21/18 0720  WBC 10.3  NEUTROABS 8.2*  HGB 13.5  HCT 40.6  MCV 92.7  PLT 093   Basic Metabolic Panel: Recent Labs  Lab 04/21/18 0720  NA 133*  K 3.6  CL 98  CO2 23  GLUCOSE 105*  BUN 9  CREATININE 0.57  CALCIUM 9.0   GFR: Estimated Creatinine Clearance: 46.3 mL/min (by C-G formula based on SCr of 0.57 mg/dL). Liver Function Tests: Recent Labs  Lab 04/21/18 0720  AST 48*  ALT 29  ALKPHOS 66  BILITOT 1.7*  PROT 6.7  ALBUMIN 3.8   Recent Labs  Lab 04/21/18 0720  LIPASE 23   No results for input(s): AMMONIA in the last 168 hours. Coagulation Profile: No results for input(s): INR, PROTIME in the last 168 hours. Cardiac Enzymes: Recent Labs  Lab 04/21/18  0920 04/21/18 1533 04/21/18 2244  TROPONINI <0.03 <0.03 <0.03   BNP (last 3 results) No results for input(s): PROBNP in the last 8760 hours. HbA1C: No results for input(s): HGBA1C in the last 72 hours. CBG: No results for input(s): GLUCAP in the last 168 hours. Lipid Profile: No results for input(s): CHOL, HDL, LDLCALC, TRIG, CHOLHDL, LDLDIRECT in the last 72 hours. Thyroid Function Tests: Recent Labs    04/21/18 0749  TSH 2.863   Anemia Panel: No results for input(s): VITAMINB12, FOLATE, FERRITIN, TIBC, IRON, RETICCTPCT in the last 72 hours. Urine analysis:    Component Value Date/Time   COLORURINE STRAW (A) 04/21/2018 0941   APPEARANCEUR CLEAR 04/21/2018 0941   LABSPEC 1.003 (L) 04/21/2018 0941   PHURINE 8.0 04/21/2018 0941   GLUCOSEU NEGATIVE 04/21/2018 0941   HGBUR NEGATIVE 04/21/2018 0941   BILIRUBINUR NEGATIVE 04/21/2018 0941   KETONESUR 5 (A) 04/21/2018 0941   PROTEINUR NEGATIVE 04/21/2018 0941   UROBILINOGEN 0.2 12/24/2014 1132   NITRITE NEGATIVE 04/21/2018 0941   LEUKOCYTESUR NEGATIVE 04/21/2018 0941     Radiological Exams on Admission: Ct  Abdomen Pelvis W Contrast  Result Date: 04/21/2018 CLINICAL DATA:  Diffuse abdominal pain since last night. Pain slightly worse on the left side. History of small-bowel obstruction. EXAM: CT ABDOMEN AND PELVIS WITH CONTRAST TECHNIQUE: Multidetector CT imaging of the abdomen and pelvis was performed using the standard protocol following bolus administration of intravenous contrast. CONTRAST:  165mL OMNIPAQUE IOHEXOL 300 MG/ML  SOLN COMPARISON:  Radiographs 07/22/2015 FINDINGS: Lower chest: Mild dependent atelectasis at both lung bases superimposed on mild underlying emphysematous changes. There is a small calcified granuloma anteriorly in the right lower lobe. No significant pleural or pericardial effusion. There is a small hiatal hernia. Hepatobiliary: The liver is normal in density without suspicious focal abnormality. No evidence of gallstones, gallbladder wall thickening or biliary dilatation. Pancreas: Unremarkable. No pancreatic ductal dilatation or surrounding inflammatory changes. Spleen: Normal in size without focal abnormality. Adrenals/Urinary Tract: Both adrenal glands appear normal. There is a large wedge-shaped area of decreased enhancement anterolaterally in the lower pole of the right kidney, suspicious for an infarct. There is another smaller area in the interpolar region of the right kidney. The left kidney appears normal. There is bilateral excretion of contrast and no hydronephrosis or urinary tract calculus. The bladder is distended but otherwise appears normal. Stomach/Bowel: No evidence of bowel wall thickening, distention or surrounding inflammatory change. The appendix is not clearly visualized. There is no pericecal inflammation. There is prominent stool in the distal colon mild sigmoid diverticulosis. Vascular/Lymphatic: There are no enlarged abdominal or pelvic lymph nodes. Aortic and branch vessel atherosclerosis. The renal arteries are patent bilaterally. There is ostial stenosis at  the celiac trunk. The superior and inferior mesenteric arteries are patent. The portal, superior mesenteric and renal veins are patent. Reproductive: Hysterectomy. Possible residual ovarian tissue bilaterally. No adnexal mass. Other: Postsurgical changes in the anterior abdominal wall with small infraumbilical hernia containing only fat. No ascites. Musculoskeletal: No acute or significant osseous findings. Superior endplate compression deformity at L1 appears chronic. There are degenerative changes throughout the lumbar spine associated with a convex left scoliosis. IMPRESSION: 1. Probable acute/subacute right renal infarcts. Mass lesion on likely. Recommend follow-up CT in 2-3 months. 2. No other acute findings. No evidence of bowel obstruction or bowel ischemia. 3. Aortic and branch vessel atherosclerosis. The renal arteries and veins are patent. There is ostial stenosis of the celiac trunk. 4. Electronically Signed   By: Gwyndolyn Saxon  Lin Landsman M.D.   On: 04/21/2018 10:56   Dg Chest Portable 1 View  Result Date: 04/21/2018 CLINICAL DATA:  Chest pain EXAM: PORTABLE CHEST 1 VIEW COMPARISON:  December 24, 2014 FINDINGS: No edema or consolidation. Heart is upper normal in size with pulmonary vascularity normal. No adenopathy. No bone lesions. No pneumothorax. IMPRESSION: No edema or consolidation.  Heart upper normal in size. Electronically Signed   By: Lowella Grip III M.D.   On: 04/21/2018 09:11    EKG: Afib with RVR. VTach, unsustained. No STEMI.     Assessment/Plan Principal Problem:   Atrial fibrillation with RVR (HCC) Active Problems:   Dyslipidemia   COPD (chronic obstructive pulmonary disease) (HCC)   Hypothyroidism   Renal infarction ([place])  1. PAF with RVR: Rates have improved since last night, mostly in the 80-90s on telemetry. Rate controlled with Cardizem 30 mg PO Q6H. CHADSVASc Score of 7 points. Begin anticoagulation therapy with Apixaban 5 mg PO BID.  2. Chest pain: has improved since  arrival last night. Echo with normal EF.  2. Epigastric Pain: likely secondary to right renal infarcts. 3. Renal infarction: start Apixaban 5 mg PO BID indefinitely. Recommend follow-up CT in 2-3 months. 4. Hyperlipidemia: Continue rosuvastatin 10 mg PO daily 5. Hypothyroidism: TSH performed yesterday, within normal limits. Continue levothyroxine 75 mcg Q6H 6. COPD: stable, no exacerbation.   Drucilla Schmidt, PA-S 04/22/2018, 2:30 PM

## 2018-04-22 NOTE — Progress Notes (Signed)
PROGRESS NOTE    Renny Gunnarson  IWL:[id number] DOB: [birth year] DOA: 04/21/2018 PCP: Lajean Manes, MD    Brief Narrative:  85 year old female who presented with chest pain, she does have significant past medical history for CVA, paroxysmal atrial fibrillation, hypothyroidism, hypertension and coronary disease status post stenting.  Reported precordial chest pain, associated with intermittent palpitations, lightheadedness, dizziness and fatigue.  On her initial physical examination her temperature was 97.9 degrees Fahrenheit, blood pressure 129/104, pulse rate 126, respiratory rate 16, oxygen saturation 97%.  Heart S1-S2 present, irregularly irregular, lungs with no wheezing, lungs rhonchi, abdomen soft nontender, no lower extremity edema.  Her sodium was 133, potassium 3.6, chloride 98, bicarb 23, glucose 105, BUN 9, creatinine 0.57, white count 10.3, hemoglobin 13.5, hematocrit 40.6, platelets 234, urine analysis negative for infection.  CT of the abdomen and pelvis with probably acute/subacute right renal infarcts.  Her chest x-ray was clear for infiltrates.  Her EKG was atrial fibrillation, 150 bpm, normal axis.  Patient was admitted to the hospital with working diagnosis of atrial fibrillation with rapid ventricular response.   Assessment & Plan:   Principal Problem:   Atrial fibrillation with RVR (HCC) Active Problems:   Dyslipidemia   COPD (chronic obstructive pulmonary disease) (HCC)   Hypothyroidism   Renal infarction ([place])  1. Atrial fibrillation with rapid ventricular response. Patient now off IV diltiazem, will continue rate control with oral diltiazem and carvedilol.  Anticoagulation with apixaban. Patient has a high risk of thrombosis with ChadsVasc socre of 7. Continue telemetry monitoring and will have physical therapy evaluation. Echocardiogram with preserved LV systolic function.   2. COPD. Stable with no signs of exacerbation  3. Dyslipidemia. Continue statin  therapy.  4. Hypothyroid. Continue levothyroxine per home regimen.    DVT prophylaxis: apixaban   Code Status:  full Family Communication: no family at the bedside  Disposition Plan/ discharge barriers: pending clinical improvement   Body mass index is 26.16 kg/m. Malnutrition Type:      Malnutrition Characteristics:      Nutrition Interventions:     RN Pressure Injury Documentation:     Consultants:   Cardiology   Procedures:     Antimicrobials:       Subjective: Patient now is feeling better, but not yet back to baseline, continue to have generalized weakness, no chest pain or nausea.   Objective: Vitals:   04/22/18 0744 04/22/18 0935 04/22/18 1231 04/22/18 1427  BP: 99/62 93/63 (!) 99/47 103/66  Pulse: 90  88   Resp: 17     Temp: 98.4 F (36.9 C)  98.6 F (37 C)   TempSrc: Oral  Oral   SpO2: 98%  96%   Weight:        Intake/Output Summary (Last 24 hours) at 04/22/2018 1614 Last data filed at 04/22/2018 1000 Gross per 24 hour  Intake 283.99 ml  Output -  Net 283.99 ml   Filed Weights   04/22/18 0408  Weight: 64.9 kg    Examination:   General: deconditioned  Neurology: Awake and alert, non focal  E ENT: positive pallor, no icterus, oral mucosa moist Cardiovascular: No JVD. S1-S2 present, irregularly irregular, no gallops, rubs, or murmurs. Trace lower extremity edema. Pulmonary: vesicular breath sounds bilaterally, adequate air movement, no wheezing, rhonchi or rales. Gastrointestinal. Abdomen with no organomegaly, non tender, no rebound or guarding Skin. No rashes Musculoskeletal: no joint deformities     Data Reviewed: I have personally reviewed following labs and imaging studies  CBC: Recent  Labs  Lab 04/21/18 0720  WBC 10.3  NEUTROABS 8.2*  HGB 13.5  HCT 40.6  MCV 92.7  PLT 888   Basic Metabolic Panel: Recent Labs  Lab 04/21/18 0720  NA 133*  K 3.6  CL 98  CO2 23  GLUCOSE 105*  BUN 9  CREATININE 0.57   CALCIUM 9.0   GFR: Estimated Creatinine Clearance: 46.3 mL/min (by C-G formula based on SCr of 0.57 mg/dL). Liver Function Tests: Recent Labs  Lab 04/21/18 0720  AST 48*  ALT 29  ALKPHOS 66  BILITOT 1.7*  PROT 6.7  ALBUMIN 3.8   Recent Labs  Lab 04/21/18 0720  LIPASE 23   No results for input(s): AMMONIA in the last 168 hours. Coagulation Profile: No results for input(s): INR, PROTIME in the last 168 hours. Cardiac Enzymes: Recent Labs  Lab 04/21/18 0920 04/21/18 1533 04/21/18 2244  TROPONINI <0.03 <0.03 <0.03   BNP (last 3 results) No results for input(s): PROBNP in the last 8760 hours. HbA1C: No results for input(s): HGBA1C in the last 72 hours. CBG: No results for input(s): GLUCAP in the last 168 hours. Lipid Profile: No results for input(s): CHOL, HDL, LDLCALC, TRIG, CHOLHDL, LDLDIRECT in the last 72 hours. Thyroid Function Tests: Recent Labs    04/21/18 0749  TSH 2.863   Anemia Panel: No results for input(s): VITAMINB12, FOLATE, FERRITIN, TIBC, IRON, RETICCTPCT in the last 72 hours.    Radiology Studies: I have reviewed all of the imaging during this hospital visit personally     Scheduled Meds: . apixaban  5 mg Oral BID  . carvedilol  6.25 mg Oral BID WC  . diltiazem  30 mg Oral Q6H  . levothyroxine  75 mcg Oral Q0600  . rosuvastatin  10 mg Oral Daily   Continuous Infusions:   LOS: 1 day        Nianna Igo Gerome Apley, MD

## 2018-04-22 NOTE — Discharge Instructions (Signed)

## 2018-04-22 NOTE — Progress Notes (Signed)
[facility] for Eliquis Indication: atrial fibrillation, Renal infarct   Allergies  Allergen Reactions  . Anesthesia S-I-60 Nausea And Vomiting    "with hysterectomy years back had post op N/V"    Patient Measurements: Weight: 143 lb (64.9 kg)  Vital Signs: Temp: 98.4 F (36.9 C) (02/17 0744) Temp Source: Oral (02/17 0744) BP: 99/62 (02/17 0744) Pulse Rate: 90 (02/17 0744)  Labs: Recent Labs    04/21/18 0720 04/21/18 0920 04/21/18 1533 04/21/18 2244  HGB 13.5  --   --   --   HCT 40.6  --   --   --   PLT 234  --   --   --   CREATININE 0.57  --   --   --   TROPONINI  --  <0.03 <0.03 <0.03    Estimated Creatinine Clearance: 46.3 mL/min (by C-G formula based on SCr of 0.57 mg/dL).   Medical History: Past Medical History:  Diagnosis Date  . Anginal pain ([place])   . Arthritis   . Complication of anesthesia   . Coronary artery disease   . Dyspnea    exertion  . Hypertension   . Hypothyroidism   . PAF (paroxysmal atrial fibrillation) ([place])    on Xarelto for maybe a year, opted to stop  . PONV (postoperative nausea and vomiting)   . Renal infarction ([place])   . Small bowel obstruction ([place]) 06/2015  . Stroke [facility])    oct 2016  . TIA (transient ischemic attack) 03/24/2015  . Vertigo, benign positional    to be evaluated, intermittent    Assessment: 86 YOF with hx PAF and is no longer on anticoagulation PTA (had been on Eliquis), now in afib, also CT reveals renal infarcts.  Pharmacy consulted to dose Eliquis.   -VTE dosing of apixaban should not be necessary.   Goal of Therapy:  Monitor platelets by anticoagulation protocol: Yes   Plan:  -Change apixaban to 5mg  po bid -Will follow patient progress  Hildred Laser, PharmD Clinical Pharmacist **Pharmacist phone directory can now be found on [email or web address] ([email or web address]).  Listed under [place].

## 2018-04-22 NOTE — Care Management (Signed)
#  3.   S/W DAVE  @ [facility] # [phone] OPT- MEMBER   APIXABAN : NONE FORMULARY  ELIQUIS  5 MG BID  COVER- YES CO-PAY- $ 297.00 TIER- 3 DRUG PRIOR APPROVAL- NO  DEDUCTIBLE : NOT MET  PREFERRED PHARMACY:  YES [facility] AND  [facility]

## 2018-04-22 NOTE — Progress Notes (Addendum)
The patient has been seen in conjunction with Reino Bellis, NP. All aspects of care have been considered and discussed. The patient has been personally interviewed, examined, and all clinical data has been reviewed.   Still in A. fib with relatively poor rate control.  Had brief spontaneous reversion to normal sinus rhythm.  Previously on antiarrhythmic therapy as an outpatient but medication was stopped sometime ago as she felt it was not helping and she was concerned about bleeding.  Anticoagulation with Eliquis has been resumed.  Duration of current episode of atrial fibrillation is unclear.  Embolic right renal infarct, subacute.  Plan now is rate control with eventual cardioversion possibly with the addition of an antiarrhythmic agent to a whole rhythm.  Once heart rate control she will be eligible for discharge with outpatient cardioversion and several weeks.  If unable to control rate, TEE guided cardioversion may become necessary while in the hospital.  Progress Note  Patient Name: Rachel Park Date of Encounter: 04/22/2018  Primary Cardiologist: Mertie Moores, MD   Subjective   No complaints this morning.   Inpatient Medications    Scheduled Meds: . apixaban  10 mg Oral BID   Followed by  . [START ON 04/28/2018] apixaban  5 mg Oral BID  . aspirin EC  81 mg Oral Daily  . carvedilol  6.25 mg Oral BID WC  . levothyroxine  75 mcg Oral Q0600  . rosuvastatin  10 mg Oral Daily   Continuous Infusions: . diltiazem (CARDIZEM) infusion 5 mg/hr (04/22/18 0140)   PRN Meds: acetaminophen, ondansetron (ZOFRAN) IV   Vital Signs    Vitals:   04/21/18 2118 04/21/18 2352 04/22/18 0007 04/22/18 0408  BP: (!) 72/47 (!) 90/49 99/61 (!) 90/56  Pulse:  73  82  Resp:  (!) 23 13 19   Temp:  98.7 F (37.1 C)  98 F (36.7 C)  TempSrc:  Oral  Oral  SpO2:  97%    Weight:    64.9 kg    Intake/Output Summary (Last 24 hours) at 04/22/2018 0721 Last data filed at 04/21/2018 1842 Gross  per 24 hour  Intake 1087.66 ml  Output 600 ml  Net 487.66 ml   Last 3 Weights 04/22/2018 11/20/2017 05/23/2017  Weight (lbs) 143 lb 140 lb 136 lb  Weight (kg) 64.864 kg 63.504 kg 61.689 kg      Telemetry    Afib rate mostly controlled in the 80s - Personally Reviewed  ECG    Aib RVR - Personally Reviewed  Physical Exam   GEN: No acute distress.   Neck: No JVD Cardiac: Irreg Irreg, no murmurs, rubs, or gallops.  Respiratory: Clear to auscultation bilaterally. GI: Soft, nontender, non-distended  MS: No edema; No deformity. Neuro:  Nonfocal  Psych: Normal affect   Labs    Chemistry Recent Labs  Lab 04/21/18 0720  NA 133*  K 3.6  CL 98  CO2 23  GLUCOSE 105*  BUN 9  CREATININE 0.57  CALCIUM 9.0  PROT 6.7  ALBUMIN 3.8  AST 48*  ALT 29  ALKPHOS 66  BILITOT 1.7*  GFRNONAA >60  GFRAA >60  ANIONGAP 12     Hematology Recent Labs  Lab 04/21/18 0720  WBC 10.3  RBC 4.38  HGB 13.5  HCT 40.6  MCV 92.7  MCH 30.8  MCHC 33.3  RDW 13.6  PLT 234    Cardiac Enzymes Recent Labs  Lab 04/21/18 0920 04/21/18 1533 04/21/18 2244  TROPONINI <0.03 <0.03 <0.03  Recent Labs  Lab 04/21/18 0744  TROPIPOC 0.01     BNPNo results for input(s): BNP, PROBNP in the last 168 hours.   DDimer No results for input(s): DDIMER in the last 168 hours.   Radiology    Ct Abdomen Pelvis W Contrast  Result Date: 04/21/2018 CLINICAL DATA:  Diffuse abdominal pain since last night. Pain slightly worse on the left side. History of small-bowel obstruction. EXAM: CT ABDOMEN AND PELVIS WITH CONTRAST TECHNIQUE: Multidetector CT imaging of the abdomen and pelvis was performed using the standard protocol following bolus administration of intravenous contrast. CONTRAST:  115mL OMNIPAQUE IOHEXOL 300 MG/ML  SOLN COMPARISON:  Radiographs 07/22/2015 FINDINGS: Lower chest: Mild dependent atelectasis at both lung bases superimposed on mild underlying emphysematous changes. There is a small  calcified granuloma anteriorly in the right lower lobe. No significant pleural or pericardial effusion. There is a small hiatal hernia. Hepatobiliary: The liver is normal in density without suspicious focal abnormality. No evidence of gallstones, gallbladder wall thickening or biliary dilatation. Pancreas: Unremarkable. No pancreatic ductal dilatation or surrounding inflammatory changes. Spleen: Normal in size without focal abnormality. Adrenals/Urinary Tract: Both adrenal glands appear normal. There is a large wedge-shaped area of decreased enhancement anterolaterally in the lower pole of the right kidney, suspicious for an infarct. There is another smaller area in the interpolar region of the right kidney. The left kidney appears normal. There is bilateral excretion of contrast and no hydronephrosis or urinary tract calculus. The bladder is distended but otherwise appears normal. Stomach/Bowel: No evidence of bowel wall thickening, distention or surrounding inflammatory change. The appendix is not clearly visualized. There is no pericecal inflammation. There is prominent stool in the distal colon mild sigmoid diverticulosis. Vascular/Lymphatic: There are no enlarged abdominal or pelvic lymph nodes. Aortic and branch vessel atherosclerosis. The renal arteries are patent bilaterally. There is ostial stenosis at the celiac trunk. The superior and inferior mesenteric arteries are patent. The portal, superior mesenteric and renal veins are patent. Reproductive: Hysterectomy. Possible residual ovarian tissue bilaterally. No adnexal mass. Other: Postsurgical changes in the anterior abdominal wall with small infraumbilical hernia containing only fat. No ascites. Musculoskeletal: No acute or significant osseous findings. Superior endplate compression deformity at L1 appears chronic. There are degenerative changes throughout the lumbar spine associated with a convex left scoliosis. IMPRESSION: 1. Probable acute/subacute  right renal infarcts. Mass lesion on likely. Recommend follow-up CT in 2-3 months. 2. No other acute findings. No evidence of bowel obstruction or bowel ischemia. 3. Aortic and branch vessel atherosclerosis. The renal arteries and veins are patent. There is ostial stenosis of the celiac trunk. 4. Electronically Signed   By: Richardean Sale M.D.   On: 04/21/2018 10:56   Dg Chest Portable 1 View  Result Date: 04/21/2018 CLINICAL DATA:  Chest pain EXAM: PORTABLE CHEST 1 VIEW COMPARISON:  December 24, 2014 FINDINGS: No edema or consolidation. Heart is upper normal in size with pulmonary vascularity normal. No adenopathy. No bone lesions. No pneumothorax. IMPRESSION: No edema or consolidation.  Heart upper normal in size. Electronically Signed   By: Lowella Grip III M.D.   On: 04/21/2018 09:11    Cardiac Studies   TTE: 04/21/2018  IMPRESSIONS    1. The left ventricle has normal systolic function, with an ejection fraction of 55-60%. The cavity size was normal. Mild basal septal hypertrophy. Left ventricular diastology could not be evaluated secondary to atrial fibrillation.  2. The right ventricle has normal systolic function. The cavity was normal. There is  no increase in right ventricular wall thickness.  3. Left atrial size was moderately dilated.  4. The mitral valve is normal in structure.  5. The tricuspid valve is normal in structure. Tricuspid valve regurgitation is mild-moderate.  6. The aortic valve is tricuspid Mild sclerosis of the aortic valve.  7. The pulmonic valve was normal in structure. Pulmonic valve regurgitation is mild by color flow Doppler.  8. Right atrial pressure is estimated at 3 mmHg.  Patient Profile     85 y.o. female with PMH of CAD, PAF, HTN, hypothyroidism, Stroke, and hernia/bowel perforation ('17) who presented with chest pain and found to be in Afib RVR.   Assessment & Plan    1. Afib RVR: rates have improved mostly in the 80-90s on telemetry. She  remains on coreg and Dilt drip this morning. Eliquis was started yesterday. Therapy is limited as her blood pressures have been soft. Will attempt to transition to PO Dilt at 30mg  q6hr and monitor blood pressures. Would like to allow for several weeks of [place] prior to attempted DCCV but will need to follow blood pressures with therapy.  -- started on Eliquis 10mg  BID?  Would like to discuss this dose with PharmD as CT showed only renal infarcts.  2. Chest pain: No further chest pain. Trop neg x3. Will stop ASA given the need for Eliquis.  -- echo with normal EF without WMAs.   3. Renal infarct: noted on CT scan. Likely from PAF. Will need to be on Eliquis indefinitely now.   4. Hypothyroidism: TSH within normal limits. Continue synthroid.    For questions or updates, please contact [place] Please consult [email or web address] for contact info under      Signed, Reino Bellis, NP  04/22/2018, 7:21 AM

## 2018-04-23 LAB — BASIC METABOLIC PANEL
Anion gap: 10 (ref 5–15)
BUN: 10 mg/dL (ref 8–23)
CO2: 23 mmol/L (ref 22–32)
Calcium: 8.4 mg/dL — ABNORMAL LOW (ref 8.9–10.3)
Chloride: 101 mmol/L (ref 98–111)
Creatinine, Ser: 0.52 mg/dL (ref 0.44–1.00)
GFR calc Af Amer: >60 mL/min (ref >60)
GFR calc non Af Amer: >60 mL/min (ref >60)
Glucose, Bld: 96 mg/dL (ref 70–99)
Potassium: 3.9 mmol/L (ref 3.5–5.1)
Sodium: 134 mmol/L — ABNORMAL LOW (ref 135–145)

## 2018-04-23 MED ORDER — SODIUM CHLORIDE 0.9% FLUSH: 3.0000 mL | INTRAVENOUS | Status: DC | PRN

## 2018-04-23 MED ORDER — POTASSIUM CHLORIDE CRYS ER 20 MEQ PO TBCR
20.0000 meq | EXTENDED_RELEASE_TABLET | Freq: Once | ORAL | Status: AC
  Administered 2018-04-23: 20 meq via ORAL
  Filled 2018-04-23: qty 1

## 2018-04-23 MED ORDER — SODIUM CHLORIDE 0.9% FLUSH
3.0000 mL | Freq: Two times a day (BID) | INTRAVENOUS | Status: DC
  Administered 2018-04-23 (×2): 3 mL via INTRAVENOUS

## 2018-04-23 MED ORDER — SODIUM CHLORIDE 0.9 % IV SOLN
250.0000 mL | INTRAVENOUS | Status: DC
  Administered 2018-04-24: 250 mL via INTRAVENOUS

## 2018-04-23 MED ORDER — SOTALOL HCL 80 MG PO TABS
80.0000 mg | ORAL_TABLET | Freq: Every day | ORAL | Status: DC
  Administered 2018-04-23 – 2018-04-24 (×2): 80 mg via ORAL
  Filled 2018-04-23 (×2): qty 1

## 2018-04-23 NOTE — Progress Notes (Signed)
Progress Note  Patient Name: Rachel Park Date of Encounter: 04/23/2018  Primary Cardiologist: Mertie Moores, MD   Subjective   Feels tried this morning. Slightly short of breath with just laying in the bed.   Inpatient Medications    Scheduled Meds: . apixaban  5 mg Oral BID  . carvedilol  6.25 mg Oral BID WC  . diltiazem  30 mg Oral Q6H  . levothyroxine  75 mcg Oral Q0600  . rosuvastatin  10 mg Oral Daily   Continuous Infusions:  PRN Meds: acetaminophen, ondansetron (ZOFRAN) IV   Vital Signs    Vitals:   04/22/18 1940 04/23/18 0019 04/23/18 0421 04/23/18 0924  BP: 97/62 103/67 (!) 117/100   Pulse:  72 79 90  Resp:      Temp:  98.4 F (36.9 C) 97.9 F (36.6 C) 98.3 F (36.8 C)  TempSrc:  Oral Oral Oral  SpO2:  98% 97% 98%  Weight:   64.7 kg     Intake/Output Summary (Last 24 hours) at 04/23/2018 0937 Last data filed at 04/22/2018 2300 Gross per 24 hour  Intake 1080 ml  Output -  Net 1080 ml   Last 3 Weights 04/23/2018 04/22/2018 11/20/2017  Weight (lbs) 142 lb 9.6 oz 143 lb 140 lb  Weight (kg) 64.683 kg 64.864 kg 63.504 kg      Telemetry    Afib rate 90-110 - Personally Reviewed  ECG    N/a - Personally Reviewed  Physical Exam   GEN: No acute distress.   Neck: No JVD Cardiac: Irreg Irreg, no murmurs, rubs, or gallops.  Respiratory: Clear to auscultation bilaterally. GI: Soft, mildly tender in the lower abd, non-distended  MS: No edema; No deformity. Neuro:  Nonfocal  Psych: Normal affect   Labs    Chemistry Recent Labs  Lab 04/21/18 0720 04/23/18 0343  NA 133* 134*  K 3.6 3.9  CL 98 101  CO2 23 23  GLUCOSE 105* 96  BUN 9 10  CREATININE 0.57 0.52  CALCIUM 9.0 8.4*  PROT 6.7  --   ALBUMIN 3.8  --   AST 48*  --   ALT 29  --   ALKPHOS 66  --   BILITOT 1.7*  --   GFRNONAA >60 >60  GFRAA >60 >60  ANIONGAP 12 10     Hematology Recent Labs  Lab 04/21/18 0720  WBC 10.3  RBC 4.38  HGB 13.5  HCT 40.6  MCV 92.7  MCH 30.8    MCHC 33.3  RDW 13.6  PLT 234    Cardiac Enzymes Recent Labs  Lab 04/21/18 0920 04/21/18 1533 04/21/18 2244  TROPONINI <0.03 <0.03 <0.03    Recent Labs  Lab 04/21/18 0744  TROPIPOC 0.01     BNPNo results for input(s): BNP, PROBNP in the last 168 hours.   DDimer No results for input(s): DDIMER in the last 168 hours.   Radiology    Ct Abdomen Pelvis W Contrast  Result Date: 04/21/2018 CLINICAL DATA:  Diffuse abdominal pain since last night. Pain slightly worse on the left side. History of small-bowel obstruction. EXAM: CT ABDOMEN AND PELVIS WITH CONTRAST TECHNIQUE: Multidetector CT imaging of the abdomen and pelvis was performed using the standard protocol following bolus administration of intravenous contrast. CONTRAST:  153mL OMNIPAQUE IOHEXOL 300 MG/ML  SOLN COMPARISON:  Radiographs 07/22/2015 FINDINGS: Lower chest: Mild dependent atelectasis at both lung bases superimposed on mild underlying emphysematous changes. There is a small calcified granuloma anteriorly in the right lower  lobe. No significant pleural or pericardial effusion. There is a small hiatal hernia. Hepatobiliary: The liver is normal in density without suspicious focal abnormality. No evidence of gallstones, gallbladder wall thickening or biliary dilatation. Pancreas: Unremarkable. No pancreatic ductal dilatation or surrounding inflammatory changes. Spleen: Normal in size without focal abnormality. Adrenals/Urinary Tract: Both adrenal glands appear normal. There is a large wedge-shaped area of decreased enhancement anterolaterally in the lower pole of the right kidney, suspicious for an infarct. There is another smaller area in the interpolar region of the right kidney. The left kidney appears normal. There is bilateral excretion of contrast and no hydronephrosis or urinary tract calculus. The bladder is distended but otherwise appears normal. Stomach/Bowel: No evidence of bowel wall thickening, distention or surrounding  inflammatory change. The appendix is not clearly visualized. There is no pericecal inflammation. There is prominent stool in the distal colon mild sigmoid diverticulosis. Vascular/Lymphatic: There are no enlarged abdominal or pelvic lymph nodes. Aortic and branch vessel atherosclerosis. The renal arteries are patent bilaterally. There is ostial stenosis at the celiac trunk. The superior and inferior mesenteric arteries are patent. The portal, superior mesenteric and renal veins are patent. Reproductive: Hysterectomy. Possible residual ovarian tissue bilaterally. No adnexal mass. Other: Postsurgical changes in the anterior abdominal wall with small infraumbilical hernia containing only fat. No ascites. Musculoskeletal: No acute or significant osseous findings. Superior endplate compression deformity at L1 appears chronic. There are degenerative changes throughout the lumbar spine associated with a convex left scoliosis. IMPRESSION: 1. Probable acute/subacute right renal infarcts. Mass lesion on likely. Recommend follow-up CT in 2-3 months. 2. No other acute findings. No evidence of bowel obstruction or bowel ischemia. 3. Aortic and branch vessel atherosclerosis. The renal arteries and veins are patent. There is ostial stenosis of the celiac trunk. 4. Electronically Signed   By: Richardean Sale M.D.   On: 04/21/2018 10:56    Cardiac Studies   TTE: 04/21/2018  IMPRESSIONS    1. The left ventricle has normal systolic function, with an ejection fraction of 55-60%. The cavity size was normal. Mild basal septal hypertrophy. Left ventricular diastology could not be evaluated secondary to atrial fibrillation.  2. The right ventricle has normal systolic function. The cavity was normal. There is no increase in right ventricular wall thickness.  3. Left atrial size was moderately dilated.  4. The mitral valve is normal in structure.  5. The tricuspid valve is normal in structure. Tricuspid valve regurgitation is  mild-moderate.  6. The aortic valve is tricuspid Mild sclerosis of the aortic valve.  7. The pulmonic valve was normal in structure. Pulmonic valve regurgitation is mild by color flow Doppler.  8. Right atrial pressure is estimated at 3 mmHg.  Patient Profile     85 y.o. female with PMH of CAD, PAF, HTN, hypothyroidism, Stroke, and hernia/bowel perforation ('17) who presented with chest pain and found to be in Afib RVR.   Assessment & Plan    1. Afib RVR: rates have improved but still feels fatigued while at rest. Transitioned to PO dilt 60mg  q6hr yesterday. Discussed with Dr. Tamala Julian and will stop coreg and start Sotalol 80mg  dialy. Planned for TEE/DCCV tomorrow at 2pm.  -- continue Eliquis 5mg  BID, no ASA.   2. Chest pain: No further chest pain. Trop neg x3.  -- echo with normal EF without WMAs.   3. Renal infarct: noted on CT scan. Likely from PAF. Will need to be on Eliquis indefinitely now.   4. Hypothyroidism: TSH within  normal limits. Continue synthroid.   5. Abd discomfort: hx of abd surgery with hernia repair in the past. Does have reducible along incisional scar below umbilicus.    For questions or updates, please contact [place] Please consult [email or web address] for contact info under      Signed, Reino Bellis, NP  04/23/2018, 9:37 AM

## 2018-04-23 NOTE — Plan of Care (Signed)
  Problem: Education: Goal: Knowledge of General Education information will improve Description Including pain rating scale, medication(s)/side effects and non-pharmacologic comfort measures Outcome: Progressing   Problem: Clinical Measurements: Goal: Ability to maintain clinical measurements within normal limits will improve Outcome: Progressing Goal: Will remain free from infection Outcome: Progressing Goal: Diagnostic test results will improve Outcome: Progressing Goal: Cardiovascular complication will be avoided Outcome: Progressing   Problem: Safety: Goal: Ability to remain free from injury will improve Outcome: Progressing   Problem: Skin Integrity: Goal: Risk for impaired skin integrity will decrease Outcome: Progressing   Problem: Activity: Goal: Risk for activity intolerance will decrease Outcome: Completed/Met   Problem: Pain Managment: Goal: General experience of comfort will improve Outcome: Completed/Met

## 2018-04-23 NOTE — Progress Notes (Signed)
PROGRESS NOTE        PATIENT DETAILS Name: Rachel Park Age: 85 y.o. Sex: female Date of Birth: Jul 07, 1931 Admit Date: 04/21/2018 Admitting Physician Karmen Bongo, MD MEB:[id number], Christiane Ha, MD  Brief Narrative: Patient is a 85 y.o. female who presented with chest pain and SOB. She has significant past medical history for CVA, paroxysmal AF, hypothyroidism, HTN, and CAD s/p stenting. Initial episode of atrial fibrillation was in 8088 s/p complicated hernia repair which she states resolved on its own. She reported substernal chest pain associated with intermittent chest palpitations, shortness of breath, dizziness, weakness, and generalized fatigue. She had been experiencing increased dyspnea on exertion over the past several weeks while walking uphill. Heart rate in 140s on initial presentation to ED, now in 90s-110s with PO diltiazem and carvedilol. She reports she did not sleep well last night and feels restless and extremely fatigued. She notes congestion and cough that have progressed over the past two days and is unsure if this is contributing her fatigue. Chest tightness has improved.   Subjective:  Patient is feeling better however continues to experience generalized weakness. No chest pain or discomfort. No headache. No nausea, vomiting, or diarrhea.   Assessment/Plan: Principal Problem:   Atrial fibrillation with RVR (HCC) Active Problems:   Dyslipidemia   COPD (chronic obstructive pulmonary disease) (HCC)   Hypothyroidism   Renal infarct ([place])  1. Atrial fibrillation with RVR: continue rate control with PO diltiazem 60 mg Q6H. Cardiology consulted. Patient continues to feel symptomatic, plan to proceed with TEE with cardioversion scheduled for 2/19 at 2pm. Carvedilol discontinued, patient started on PO sotalol 80 mg daily. Will continue atrial fibrillation anticoagulation with Apixaban 5 mg BID. 2. Chest pain: has improved since admission. Echo with EF of  55-60%.  3. Renal infarction: continue with Apixaban 5 mg BID indefinitely. Recommend follow-up CT in 2-3 months. 4. Hyperlipidemia: continue rosuvastatin 10 mg PO daily. 5. Hypothyroidism: continue levothyroxine 75 mcg Q6H 6. COPD: stable, no acute exacerbation.   Telemetry (independently reviewed):  Afib with rate in 90-110s   Echo (reviewed): Left ventricle EF of 55-60% on TTE done on 04/21/18.   Code Status: Full  Anticoagulation: Full dose anticoagulation with Eliquis 5 mg BID   Disposition Plan:  Remain inpatient, pending TEE scheduled for tomorrow  Antimicrobial agents: Anti-infectives (From admission, onward)   None      Procedures:   CONSULTS: Cardiolgy   MEDICATIONS: Scheduled Meds: . apixaban  5 mg Oral BID  . diltiazem  30 mg Oral Q6H  . levothyroxine  75 mcg Oral Q0600  . rosuvastatin  10 mg Oral Daily  . sodium chloride flush  3 mL Intravenous Q12H  . sotalol  80 mg Oral Daily   Continuous Infusions: . sodium chloride     PRN Meds:.acetaminophen, sodium chloride flush   PHYSICAL EXAM: Vital signs: Vitals:   04/22/18 1940 04/23/18 0019 04/23/18 0421 04/23/18 0924  BP: 97/62 103/67 (!) 117/100   Pulse:  72 79 90  Resp:      Temp:  98.4 F (36.9 C) 97.9 F (36.6 C) 98.3 F (36.8 C)  TempSrc:  Oral Oral Oral  SpO2:  98% 97% 98%  Weight:   64.7 kg    Filed Weights   04/22/18 0408 04/23/18 0421  Weight: 64.9 kg 64.7 kg   Body mass index is 26.08 kg/m.  General: awake, alert, patient is sitting up and eating. HEENT: oral mucosa moist. Neck: supple, no cervical lymphadenopathy. CV: irregularly irregular, no murmurs, rubs, or gallops. No lower extremity edema.  Pulm: normal breath sounds bilaterally, no wheezing, rhonchi, or rales.  GI: mild tenderness with palpation over lower abdomen. Non-distended with no gaurding, rigidity or rebound Musculoskeletal: no visible joint deformities Psychiatric: normal judgment and insight. Normal  mood. Skin: no rash, warm and dry  I have personally reviewed following labs and imaging studies  LABORATORY DATA: CBC: Recent Labs  Lab 04/21/18 0720  WBC 10.3  NEUTROABS 8.2*  HGB 13.5  HCT 40.6  MCV 92.7  PLT 174    Basic Metabolic Panel: Recent Labs  Lab 04/21/18 0720 04/23/18 0343  NA 133* 134*  K 3.6 3.9  CL 98 101  CO2 23 23  GLUCOSE 105* 96  BUN 9 10  CREATININE 0.57 0.52  CALCIUM 9.0 8.4*    GFR: Estimated Creatinine Clearance: 46.2 mL/min (by C-G formula based on SCr of 0.52 mg/dL).  Liver Function Tests: Recent Labs  Lab 04/21/18 0720  AST 48*  ALT 29  ALKPHOS 66  BILITOT 1.7*  PROT 6.7  ALBUMIN 3.8   Recent Labs  Lab 04/21/18 0720  LIPASE 23   No results for input(s): AMMONIA in the last 168 hours.  Coagulation Profile: No results for input(s): INR, PROTIME in the last 168 hours.  Cardiac Enzymes: Recent Labs  Lab 04/21/18 0920 04/21/18 1533 04/21/18 2244  TROPONINI <0.03 <0.03 <0.03    BNP (last 3 results) No results for input(s): PROBNP in the last 8760 hours.  HbA1C: No results for input(s): HGBA1C in the last 72 hours.  CBG: No results for input(s): GLUCAP in the last 168 hours.  Lipid Profile: No results for input(s): CHOL, HDL, LDLCALC, TRIG, CHOLHDL, LDLDIRECT in the last 72 hours.  Thyroid Function Tests: Recent Labs    04/21/18 0749  TSH 2.863    Anemia Panel: No results for input(s): VITAMINB12, FOLATE, FERRITIN, TIBC, IRON, RETICCTPCT in the last 72 hours.  Urine analysis:    Component Value Date/Time   COLORURINE STRAW (A) 04/21/2018 0941   APPEARANCEUR CLEAR 04/21/2018 0941   LABSPEC 1.003 (L) 04/21/2018 0941   PHURINE 8.0 04/21/2018 0941   GLUCOSEU NEGATIVE 04/21/2018 0941   HGBUR NEGATIVE 04/21/2018 0941   BILIRUBINUR NEGATIVE 04/21/2018 0941   KETONESUR 5 (A) 04/21/2018 0941   PROTEINUR NEGATIVE 04/21/2018 0941   UROBILINOGEN 0.2 12/24/2014 1132   NITRITE NEGATIVE 04/21/2018 0941    LEUKOCYTESUR NEGATIVE 04/21/2018 0941    Sepsis Labs: Lactic Acid, Venous    Component Value Date/Time   LATICACIDVEN 1.4 04/21/2018 0920    MICROBIOLOGY: Recent Results (from the past 240 hour(s))  Urine culture     Status: None   Collection Time: 04/21/18  7:23 AM  Result Value Ref Range Status   Specimen Description URINE, RANDOM  Final   Special Requests NONE  Final   Culture   Final    NO GROWTH Performed at [facility], [place] [address] [place], [place] [address]    Report Status 04/22/2018 FINAL  Final    RADIOLOGY STUDIES/RESULTS: Ct Abdomen Pelvis W Contrast  Result Date: 04/21/2018 CLINICAL DATA:  Diffuse abdominal pain since last night. Pain slightly worse on the left side. History of small-bowel obstruction. EXAM: CT ABDOMEN AND PELVIS WITH CONTRAST TECHNIQUE: Multidetector CT imaging of the abdomen and pelvis was performed using the standard protocol following bolus administration  of intravenous contrast. CONTRAST:  17mL OMNIPAQUE IOHEXOL 300 MG/ML  SOLN COMPARISON:  Radiographs 07/22/2015 FINDINGS: Lower chest: Mild dependent atelectasis at both lung bases superimposed on mild underlying emphysematous changes. There is a small calcified granuloma anteriorly in the right lower lobe. No significant pleural or pericardial effusion. There is a small hiatal hernia. Hepatobiliary: The liver is normal in density without suspicious focal abnormality. No evidence of gallstones, gallbladder wall thickening or biliary dilatation. Pancreas: Unremarkable. No pancreatic ductal dilatation or surrounding inflammatory changes. Spleen: Normal in size without focal abnormality. Adrenals/Urinary Tract: Both adrenal glands appear normal. There is a large wedge-shaped area of decreased enhancement anterolaterally in the lower pole of the right kidney, suspicious for an infarct. There is another smaller area in the interpolar region of the right kidney. The left kidney appears normal. There  is bilateral excretion of contrast and no hydronephrosis or urinary tract calculus. The bladder is distended but otherwise appears normal. Stomach/Bowel: No evidence of bowel wall thickening, distention or surrounding inflammatory change. The appendix is not clearly visualized. There is no pericecal inflammation. There is prominent stool in the distal colon mild sigmoid diverticulosis. Vascular/Lymphatic: There are no enlarged abdominal or pelvic lymph nodes. Aortic and branch vessel atherosclerosis. The renal arteries are patent bilaterally. There is ostial stenosis at the celiac trunk. The superior and inferior mesenteric arteries are patent. The portal, superior mesenteric and renal veins are patent. Reproductive: Hysterectomy. Possible residual ovarian tissue bilaterally. No adnexal mass. Other: Postsurgical changes in the anterior abdominal wall with small infraumbilical hernia containing only fat. No ascites. Musculoskeletal: No acute or significant osseous findings. Superior endplate compression deformity at L1 appears chronic. There are degenerative changes throughout the lumbar spine associated with a convex left scoliosis. IMPRESSION: 1. Probable acute/subacute right renal infarcts. Mass lesion on likely. Recommend follow-up CT in 2-3 months. 2. No other acute findings. No evidence of bowel obstruction or bowel ischemia. 3. Aortic and branch vessel atherosclerosis. The renal arteries and veins are patent. There is ostial stenosis of the celiac trunk. 4. Electronically Signed   By: Richardean Sale M.D.   On: 04/21/2018 10:56   Dg Chest Portable 1 View  Result Date: 04/21/2018 CLINICAL DATA:  Chest pain EXAM: PORTABLE CHEST 1 VIEW COMPARISON:  December 24, 2014 FINDINGS: No edema or consolidation. Heart is upper normal in size with pulmonary vascularity normal. No adenopathy. No bone lesions. No pneumothorax. IMPRESSION: No edema or consolidation.  Heart upper normal in size. Electronically Signed   By:  Lowella Grip III M.D.   On: 04/21/2018 09:11     LOS: 2 days     Drucilla Schmidt, PA-S  04/23/2018, 10:16 AM

## 2018-04-23 NOTE — Progress Notes (Signed)
Patient converted to sinus rhythm, ekf personally reviewed. Positive PAC, normal axis, normal intervals, rate 50 bpm.

## 2018-04-23 NOTE — Progress Notes (Signed)
PROGRESS NOTE    Rachel Park  VVO:[id number] DOB: 1931/11/10 DOA: 04/21/2018 PCP: Lajean Manes, MD    Brief Narrative:  85 year old female who presented with chest pain, she does have significant past medical history for CVA, paroxysmal atrial fibrillation, hypothyroidism, hypertension and coronary disease status post stenting.  Reported precordial chest pain, associated with intermittent palpitations, lightheadedness, dizziness and fatigue.  On her initial physical examination her temperature was 97.9 degrees Fahrenheit, blood pressure 129/104, pulse rate 126, respiratory rate 16, oxygen saturation 97%.  Heart S1-S2 present, irregularly irregular, lungs with no wheezing, lungs rhonchi, abdomen soft nontender, no lower extremity edema.  Her sodium was 133, potassium 3.6, chloride 98, bicarb 23, glucose 105, BUN 9, creatinine 0.57, white count 10.3, hemoglobin 13.5, hematocrit 40.6, platelets 234, urine analysis negative for infection.  CT of the abdomen and pelvis with probably acute/subacute right renal infarcts.  Her chest x-ray was clear for infiltrates.  Her EKG was atrial fibrillation, 150 bpm, normal axis.  Patient was admitted to the hospital with working diagnosis of atrial fibrillation with rapid ventricular response.    Assessment & Plan:   Principal Problem:   Atrial fibrillation with RVR (HCC) Active Problems:   Dyslipidemia   COPD (chronic obstructive pulmonary disease) (HCC)   Hypothyroidism   Renal infarct ([place])   1. Atrial fibrillation with rapid ventricular response. Improved rate control but patient continue to be symptomatic, plan to continue diltiazem po and cardioversion in am, prior TEE. Started on sotalol. Continue anticoagulation with apixaban. Elevated ChadsVasc score up to 7. Pulse rate 92 to 129 bmp.   2. COPD. With exacerbation  3. Dyslipidemia. On statin therapy.  4. Hypothyroid. On levothyroxine.   5. Hypokalemia. Serum K down to 3,9, target above  4, received 40 meq Kcl this am, will follow on renal panel in am.     DVT prophylaxis: apixaban   Code Status:  full Family Communication: no family at the bedside  Disposition Plan/ discharge barriers: pending clinical improvement    Body mass index is 26.08 kg/m. Malnutrition Type:      Malnutrition Characteristics:      Nutrition Interventions:     RN Pressure Injury Documentation:     Consultants:   Cardiology   Procedures:     Antimicrobials:       Subjective: Patient is feeling better but continue to have palpitations and fatigue, no frank dyspnea or lower extremity edema. No nausea or vomiting.   Objective: Vitals:   04/22/18 1940 04/23/18 0019 04/23/18 0421 04/23/18 0924  BP: 97/62 103/67 (!) 117/100   Pulse:  72 79 90  Resp:      Temp:  98.4 F (36.9 C) 97.9 F (36.6 C) 98.3 F (36.8 C)  TempSrc:  Oral Oral Oral  SpO2:  98% 97% 98%  Weight:   64.7 kg     Intake/Output Summary (Last 24 hours) at 04/23/2018 1236 Last data filed at 04/22/2018 2300 Gross per 24 hour  Intake 840 ml  Output -  Net 840 ml   Filed Weights   04/22/18 0408 04/23/18 0421  Weight: 64.9 kg 64.7 kg    Examination:   General: Not in pain or dyspnea, deconditioned  Neurology: Awake and alert, non focal  E ENT: mild pallor, no icterus, oral mucosa moist Cardiovascular: No JVD. S1-S2 present, irregularly irregular, no gallops, rubs, or murmurs. No lower extremity edema. Pulmonary: positive breath sounds bilaterally, adequate air movement, no wheezing, rhonchi or rales. Gastrointestinal. Abdomen flat, no organomegaly, non  tender, no rebound or guarding Skin. No rashes Musculoskeletal: no joint deformities     Data Reviewed: I have personally reviewed following labs and imaging studies  CBC: Recent Labs  Lab 04/21/18 0720  WBC 10.3  NEUTROABS 8.2*  HGB 13.5  HCT 40.6  MCV 92.7  PLT 646   Basic Metabolic Panel: Recent Labs  Lab 04/21/18 0720  04/23/18 0343  NA 133* 134*  K 3.6 3.9  CL 98 101  CO2 23 23  GLUCOSE 105* 96  BUN 9 10  CREATININE 0.57 0.52  CALCIUM 9.0 8.4*   GFR: Estimated Creatinine Clearance: 46.2 mL/min (by C-G formula based on SCr of 0.52 mg/dL). Liver Function Tests: Recent Labs  Lab 04/21/18 0720  AST 48*  ALT 29  ALKPHOS 66  BILITOT 1.7*  PROT 6.7  ALBUMIN 3.8   Recent Labs  Lab 04/21/18 0720  LIPASE 23   No results for input(s): AMMONIA in the last 168 hours. Coagulation Profile: No results for input(s): INR, PROTIME in the last 168 hours. Cardiac Enzymes: Recent Labs  Lab 04/21/18 0920 04/21/18 1533 04/21/18 2244  TROPONINI <0.03 <0.03 <0.03   BNP (last 3 results) No results for input(s): PROBNP in the last 8760 hours. HbA1C: No results for input(s): HGBA1C in the last 72 hours. CBG: No results for input(s): GLUCAP in the last 168 hours. Lipid Profile: No results for input(s): CHOL, HDL, LDLCALC, TRIG, CHOLHDL, LDLDIRECT in the last 72 hours. Thyroid Function Tests: Recent Labs    04/21/18 0749  TSH 2.863   Anemia Panel: No results for input(s): VITAMINB12, FOLATE, FERRITIN, TIBC, IRON, RETICCTPCT in the last 72 hours.    Radiology Studies: I have reviewed all of the imaging during this hospital visit personally     Scheduled Meds: . apixaban  5 mg Oral BID  . diltiazem  30 mg Oral Q6H  . levothyroxine  75 mcg Oral Q0600  . rosuvastatin  10 mg Oral Daily  . sodium chloride flush  3 mL Intravenous Q12H  . sotalol  80 mg Oral Daily   Continuous Infusions: . sodium chloride       LOS: 2 days        Joelee Snoke Gerome Apley, MD

## 2018-04-24 ENCOUNTER — Inpatient Hospital Stay (HOSPITAL_COMMUNITY): Payer: Medicare HMO

## 2018-04-24 ENCOUNTER — Encounter (HOSPITAL_COMMUNITY)
Admission: EM | Disposition: A | Discharge status: Home / Self Care | Payer: Self-pay | Source: Home / Self Care | Attending: Internal Medicine

## 2018-04-24 DIAGNOSIS — J441 Chronic obstructive pulmonary disease with (acute) exacerbation: Secondary | ICD-10-CM

## 2018-04-24 LAB — MAGNESIUM: MAGNESIUM: 1.8 mg/dL (ref 1.7–2.4)

## 2018-04-24 LAB — BASIC METABOLIC PANEL
Anion gap: 9 (ref 5–15)
BUN: 16 mg/dL (ref 8–23)
CO2: 22 mmol/L (ref 22–32)
CREATININE: 0.69 mg/dL (ref 0.44–1.00)
Calcium: 8.5 mg/dL — ABNORMAL LOW (ref 8.9–10.3)
Chloride: 101 mmol/L (ref 98–111)
GFR calc Af Amer: >60 mL/min (ref >60)
GFR calc non Af Amer: >60 mL/min (ref >60)
Glucose, Bld: 114 mg/dL — ABNORMAL HIGH (ref 70–99)
Potassium: 4.1 mmol/L (ref 3.5–5.1)
Sodium: 132 mmol/L — ABNORMAL LOW (ref 135–145)

## 2018-04-24 SURGERY — ECHOCARDIOGRAM, TRANSESOPHAGEAL
Anesthesia: Monitor Anesthesia Care

## 2018-04-24 MED ORDER — DILTIAZEM HCL ER COATED BEADS 120 MG PO CP24: 120.0000 mg | ORAL_CAPSULE | Freq: Every day | ORAL | 0 refills | Status: DC

## 2018-04-24 MED ORDER — MAGNESIUM OXIDE 400 (241.3 MG) MG PO TABS
400.0000 mg | ORAL_TABLET | Freq: Two times a day (BID) | ORAL | Status: AC
  Administered 2018-04-24 (×2): 400 mg via ORAL
  Filled 2018-04-24 (×2): qty 1

## 2018-04-24 MED ORDER — SOTALOL HCL 80 MG PO TABS: 80.0000 mg | ORAL_TABLET | Freq: Every day | ORAL | 0 refills | Status: DC

## 2018-04-24 MED ORDER — APIXABAN 5 MG PO TABS: 5.0000 mg | ORAL_TABLET | Freq: Two times a day (BID) | ORAL | 0 refills | Status: DC

## 2018-04-24 MED ORDER — MAGNESIUM SULFATE 2 GM/50ML IV SOLN
2.0000 g | Freq: Once | INTRAVENOUS | Status: DC
  Filled 2018-04-24: qty 50

## 2018-04-24 MED ORDER — DILTIAZEM HCL ER COATED BEADS 120 MG PO CP24
120.0000 mg | ORAL_CAPSULE | Freq: Every day | ORAL | Status: DC
  Administered 2018-04-24: 120 mg via ORAL
  Filled 2018-04-24: qty 1

## 2018-04-24 MED FILL — ELIQUIS 5 MG TABLET: 5 | 30 days supply | Qty: 60 | Fill #0

## 2018-04-24 MED FILL — CARTIA XT 120 MG CP24: 120 | 30 days supply | Qty: 30 | Fill #0

## 2018-04-24 MED FILL — SOTALOL HCL 80 MG TAB: 80 | 30 days supply | Qty: 30 | Fill #0

## 2018-04-24 NOTE — Progress Notes (Signed)
Pt ambulated approx 569ft of hallway this shift.  O2 sat 94 on RA at start.  O2 sat dropped to 88% then back up to 92%.  Ending O2 sat is 94%

## 2018-04-24 NOTE — Progress Notes (Addendum)
The patient has been seen in conjunction with Harlan Stains, NP. All aspects of care have been considered and discussed. The patient has been personally interviewed, examined, and all clinical data has been reviewed.   Repeat EKG 2 hours after a.m. dose of sotalol does not reveal any evidence of QT prolongation.  Patient has set to be discharged and should follow-up with Dr. Acie Fredrickson within 1 week.  We discussed continuing apixaban long-term.  A basic metabolic panel should be performed on office visit with Dr. Acie Fredrickson.   Progress Note  Patient Name: Rachel Park Date of Encounter: 04/24/2018  Primary Cardiologist: Mertie Moores, MD   Subjective   Still feeling short of breath this morning. Congested, runny nose.   Inpatient Medications    Scheduled Meds: . apixaban  5 mg Oral BID  . diltiazem  30 mg Oral Q6H  . levothyroxine  75 mcg Oral Q0600  . rosuvastatin  10 mg Oral Daily  . sodium chloride flush  3 mL Intravenous Q12H  . sotalol  80 mg Oral Daily   Continuous Infusions: . sodium chloride     PRN Meds: acetaminophen, sodium chloride flush   Vital Signs    Vitals:   04/23/18 1513 04/23/18 1843 04/23/18 2006 04/24/18 0556  BP:  108/78 105/61 132/77  Pulse: 89  66 71  Resp: 18  16 18   Temp: 99.1 F (37.3 C)  98.4 F (36.9 C) 98.4 F (36.9 C)  TempSrc: Oral  Oral Oral  SpO2: 97%  96% 93%  Weight:    65.4 kg    Intake/Output Summary (Last 24 hours) at 04/24/2018 0832 Last data filed at 04/23/2018 1930 Gross per 24 hour  Intake 840 ml  Output -  Net 840 ml   Last 3 Weights 04/24/2018 04/23/2018 04/22/2018  Weight (lbs) 144 lb 1.6 oz 142 lb 9.6 oz 143 lb  Weight (kg) 65.363 kg 64.683 kg 64.864 kg      Telemetry    SR converted yesterday afternoon  - Personally Reviewed  ECG    SB - Personally Reviewed  Physical Exam   GEN: No acute distress.   Neck: No JVD Cardiac: RRR, no murmurs, rubs, or gallops.  Respiratory: Clear to auscultation  bilaterally. GI: Soft, nontender, non-distended  MS: No edema; No deformity. Neuro:  Nonfocal  Psych: Normal affect   Labs    Chemistry Recent Labs  Lab 04/21/18 0720 04/23/18 0343 04/24/18 0341  NA 133* 134* 132*  K 3.6 3.9 4.1  CL 98 101 101  CO2 23 23 22   GLUCOSE 105* 96 114*  BUN 9 10 16   CREATININE 0.57 0.52 0.69  CALCIUM 9.0 8.4* 8.5*  PROT 6.7  --   --   ALBUMIN 3.8  --   --   AST 48*  --   --   ALT 29  --   --   ALKPHOS 66  --   --   BILITOT 1.7*  --   --   GFRNONAA >60 >60 >60  GFRAA >60 >60 >60  ANIONGAP 12 10 9      Hematology Recent Labs  Lab 04/21/18 0720  WBC 10.3  RBC 4.38  HGB 13.5  HCT 40.6  MCV 92.7  MCH 30.8  MCHC 33.3  RDW 13.6  PLT 234    Cardiac Enzymes Recent Labs  Lab 04/21/18 0920 04/21/18 1533 04/21/18 2244  TROPONINI <0.03 <0.03 <0.03    Recent Labs  Lab 04/21/18 0744  TROPIPOC 0.01  BNPNo results for input(s): BNP, PROBNP in the last 168 hours.   DDimer No results for input(s): DDIMER in the last 168 hours.   Radiology    No results found.  Cardiac Studies   TTE: 04/21/2018  IMPRESSIONS    1. The left ventricle has normal systolic function, with an ejection fraction of 55-60%. The cavity size was normal. Mild basal septal hypertrophy. Left ventricular diastology could not be evaluated secondary to atrial fibrillation.  2. The right ventricle has normal systolic function. The cavity was normal. There is no increase in right ventricular wall thickness.  3. Left atrial size was moderately dilated.  4. The mitral valve is normal in structure.  5. The tricuspid valve is normal in structure. Tricuspid valve regurgitation is mild-moderate.  6. The aortic valve is tricuspid Mild sclerosis of the aortic valve.  7. The pulmonic valve was normal in structure. Pulmonic valve regurgitation is mild by color flow Doppler.  8. Right atrial pressure is estimated at 3 mmHg.  Patient Profile     85 y.o. female  with  PMH of CAD, PAF, HTN, hypothyroidism, Stroke, and hernia/bowel perforation ('17) who presented with chest pain and found to be in Afib RVR.  Assessment & Plan    1. Afib LOV:FIEPPIRJJ to SR yesterday afternoon and has maintained. Canceled TEE/DCCV today. Rates in the 64s. Will have her ambulate in the hallway today and check pulse ox. -- Will consolidate Dilt to 120mg  daily -- continue Eliquis 5mg  BID -- on sotalol, Mag is 1.8 will supplement as she is on sotalol.  -- recheck EKG for QT this morning  2. Chest pain: No further chest pain. Trop neg x3.  -- echo with normal EF without WMAs.   3. Renal infarct:noted on CT scan. Likely from PAF. Will need to be on Eliquis indefinitely now.   4. Hypothyroidism:TSH within normal limits. Continue synthroid.   For questions or updates, please contact [place] Please consult [email or web address] for contact info under        Signed, Reino Bellis, NP  04/24/2018, 8:32 AM

## 2018-04-24 NOTE — Discharge Summary (Signed)
Physician Discharge Summary  Patient ID: Rachel Park MRN: [id number] DOB/AGE: [birth year] 85 y.o.  Admit date: 04/21/2018 Discharge date: 04/24/2018  Admission Diagnoses:  Discharge Diagnoses:  Principal Problem:   Atrial fibrillation with RVR ([place]) Active Problems:   Dyslipidemia   COPD (chronic obstructive pulmonary disease) ([place])   Hypothyroidism   Renal infarct [facility])  Discharged Condition: stable  Hospital Course: Patient is an 85 year old Caucasian female, with past medical history significant for CVA, paroxysmal atrial fibrillation, hypothyroidism, hypertension and coronary disease status post stenting. Patient was admitted with chest pain, intermittent palpitations, lightheadedness, dizziness and fatigue.  On presentation to the hospital, the patient was found to be in atrial fibrillation with rapid ventricular response.  Heart rate was 150 bpm.  Patient was admitted for further assessment and management.  Cardiology team was consulted.  Patient's care was directed by the cardiology team.  Patient was managed with Cardizem and sotalol.  Patient converted spontaneously to normal sinus rhythm.  Patient is currently on anticoagulation.  Patient be discharged on Cardizem and sotalol.  The cardiology team has cleared patient for discharge.  Atrial fibrillation with rapid ventricular response: Patient with management Cardizem, and sotalol was added later. Patient converted spontaneously to normal sinus rhythm. Will discharge back home on Cardizem, sotalol and anticoagulation.  Dyslipidemia: Continue statin.  Hypothyroid.  Continue levothyroxine.  Hypokalemia.  Continue to monitor and replete.    Consults: cardiology  Significant Diagnostic Studies:  Echocardiogram revealed: 1. The left ventricle has normal systolic function, with an ejection fraction of 55-60%. The cavity size was normal. Mild basal septal hypertrophy. Left ventricular diastology could not be evaluated  secondary to atrial fibrillation.  2. The right ventricle has normal systolic function. The cavity was normal. There is no increase in right ventricular wall thickness.  3. Left atrial size was moderately dilated.  4. The mitral valve is normal in structure.  5. The tricuspid valve is normal in structure. Tricuspid valve regurgitation is mild-moderate.  6. The aortic valve is tricuspid Mild sclerosis of the aortic valve.  7. The pulmonic valve was normal in structure. Pulmonic valve regurgitation is mild by color flow Doppler.  8. Right atrial pressure is estimated at 3 mmHg.  CT abdomen and pelvis with contrast revealed: 1. Probable acute/subacute right renal infarcts. Mass lesion on likely. Recommend follow-up CT in 2-3 months. 2. No other acute findings. No evidence of bowel obstruction or bowel ischemia. 3. Aortic and branch vessel atherosclerosis. The renal arteries and veins are patent. There is ostial stenosis of the celiac trunk.  Discharge Exam: Blood pressure 111/63, pulse 71, temperature 98.4 F (36.9 C), temperature source Oral, resp. rate 18, weight 65.4 kg, SpO2 93 %.  Disposition: Discharge disposition: 01-Home or Self Care   Discharge Instructions    Diet - low sodium heart healthy   Complete by:  As directed    Increase activity slowly   Complete by:  As directed      Allergies as of 04/24/2018      Reactions   Anesthesia S-i-60 Nausea And Vomiting   "with hysterectomy years back had post op N/V"      Medication List    STOP taking these medications   amLODipine 5 MG tablet Commonly known as:  NORVASC   aspirin EC 81 MG tablet   carvedilol 6.25 MG tablet Commonly known as:  COREG   hydrochlorothiazide 12.5 MG capsule Commonly known as:  MICROZIDE     TAKE these medications   acetaminophen 500 MG  tablet Commonly known as:  TYLENOL Take 500 mg by mouth every 6 (six) hours as needed for mild pain.   apixaban 5 MG Tabs tablet Commonly known as:   ELIQUIS Take 1 tablet (5 mg total) by mouth 2 (two) times daily.   cholecalciferol 25 MCG (1000 UT) tablet Commonly known as:  VITAMIN D Take 2,000 Units by mouth daily.   diltiazem 120 MG 24 hr capsule Commonly known as:  CARDIZEM CD Take 1 capsule (120 mg total) by mouth daily. Start taking on:  April 25, 2018   levothyroxine 75 MCG tablet Commonly known as:  SYNTHROID, LEVOTHROID Take 75 mcg by mouth daily before breakfast.   NITROSTAT 0.4 MG SL tablet Generic drug:  nitroGLYCERIN Place 1 tablet (0.4 mg total) under the tongue every 5 (five) minutes as needed. Chest pain   rosuvastatin 10 MG tablet Commonly known as:  CRESTOR Take 1 tablet (10 mg total) by mouth daily.   sotalol 80 MG tablet Commonly known as:  BETAPACE Take 1 tablet (80 mg total) by mouth daily. Start taking on:  April 25, 2018   SYSTANE ULTRA 0.4-0.3 % Soln Generic drug:  Polyethyl Glycol-Propyl Glycol Place 1-2 drops into both eyes 3 (three) times daily as needed (dry eyes).      Follow-up Information    Richardson Dopp T, PA-C Follow up on 05/03/2018.   Specialties:  Cardiology, Physician Assistant Why:  at 11:15am for your follow up appt Contact information: [address] [place] [address] [phone]           Signed: Bonnell Public 04/24/2018, 2:19 PM

## 2018-05-03 ENCOUNTER — Ambulatory Visit: Payer: Medicare HMO | Admitting: Physician Assistant

## 2018-05-03 ENCOUNTER — Encounter: Payer: Self-pay | Admitting: Physician Assistant

## 2018-05-03 VITALS — BP 120/80 | HR 96 | Ht 62.0 in | Wt 147.4 lb

## 2018-05-03 DIAGNOSIS — I25119 Atherosclerotic heart disease of native coronary artery with unspecified angina pectoris: Secondary | ICD-10-CM | POA: Diagnosis not present

## 2018-05-03 DIAGNOSIS — I4819 Other persistent atrial fibrillation: Secondary | ICD-10-CM

## 2018-05-03 DIAGNOSIS — N2889 Other specified disorders of kidney and ureter: Secondary | ICD-10-CM

## 2018-05-03 DIAGNOSIS — N280 Ischemia and infarction of kidney: Secondary | ICD-10-CM | POA: Diagnosis not present

## 2018-05-03 DIAGNOSIS — E782 Mixed hyperlipidemia: Secondary | ICD-10-CM | POA: Diagnosis not present

## 2018-05-03 MED ORDER — METOPROLOL TARTRATE 25 MG PO TABS: 25.0000 mg | ORAL_TABLET | Freq: Two times a day (BID) | ORAL | 3 refills | Status: DC

## 2018-05-03 NOTE — Progress Notes (Signed)
Cardiology Office Note:    Date:  05/03/2018   ID:  Rachel Park, DOB [birth year], MRN [id number]  PCP:  Rachel Manes, MD  Cardiologist:  Mertie Moores, MD  Electrophysiologist:  None   Referring MD: Rachel Manes, MD   Chief Complaint  Patient presents with  . Hospitalization Follow-up    Atrial fibrillation    History of Present Illness:    Rachel Park is a 85 y.o. female with coronary artery disease status post prior stenting to the LAD complicated by CVA in 8850, paroxysmal atrial fibrillation, prior small bowel perforation, bradycardia.  She had previously been taken off of Eliquis secondary to prior GI bleeding.  She was recently admitted 2/16-2/19 with atrial fibrillation with rapid ventricular rate.  She presented with abdominal pain and CT scan did confirm right renal infarcts.  She was placed back on anticoagulation with Apixaban.  She was ultimately placed on sotalol which converted her back to normal sinus rhythm.     Rachel Park returns for follow up.  She is here alone.  She feels better.  But she still has episodes of dyspnea on exertion and had an episode of paroxysmal nocturnal dyspnea last night.  She brings in a list of her blood pressures and she has had some elevations in her BP and HR since DC.  She has occasional chest pain described as tightness.  She has had this off and on since her PCI but seems to have noticed it more in the last month.  She has not had exertional chest pain or arm symptoms like she did prior to her PCI.  She has not had syncope, leg swelling.    CHADS2-VASc=6 (age x 2, CAD, female, renal infarct).     Prior CV studies:   The following studies were reviewed today:  Echo 04/21/2018 EF 55-60, mild basal septal hypertrophy, normal RVSF, mod LAE, mild MR, mild to mod TR, mild AoV sclerosis, mild PI  CT Abd/Pelvis w Contrast 04/21/2018 IMPRESSION: 1. Probable acute/subacute right renal infarcts. Mass lesion on likely. Recommend follow-up CT in 2-3  months. 2. No other acute findings. No evidence of bowel obstruction or bowel ischemia. 3. Aortic and branch vessel atherosclerosis. The renal arteries and veins are patent. There is ostial stenosis of the celiac trunk.   Cardiac Catheterization 09/27/16 LAD prox stent patent with 10 ISR, mid 30 LCx mid diff ectasia RCA mid 40 EF 55-65  Myoview 06/30/15  Nuclear stress EF: 56%. No wall motion abnormalities  There was no ST segment deviation noted during stress.  This is a low risk study. No ischemia identified.    Past Medical History:  Diagnosis Date  . Anginal pain ([place])   . Arthritis   . Complication of anesthesia   . Coronary artery disease   . Dyspnea    exertion  . Hypertension   . Hypothyroidism   . PAF (paroxysmal atrial fibrillation) ([place])    on Xarelto for maybe a year, opted to stop  . PONV (postoperative nausea and vomiting)   . Renal infarction ([place])   . Small bowel obstruction ([place]) 06/2015  . Stroke [facility])    oct 2016  . TIA (transient ischemic attack) 03/24/2015  . Vertigo, benign positional    to be evaluated, intermittent   Surgical Hx: The patient  has a past surgical history that includes Abdominal hysterectomy; Tonsillectomy; Cataract extraction (Bilateral); Colonoscopy with propofol (N/A, 12/01/2013); Cardiac catheterization (N/A, 12/24/2014); laparotomy (06/2015); Inguinal hernia repair (Bilateral, 03/10/2016); Insertion of mesh (  N/A, 03/10/2016); Incisional hernia repair (N/A, 03/10/2016); and LEFT HEART CATH AND CORONARY ANGIOGRAPHY (N/A, 09/27/2016).   Current Medications: Current Meds  Medication Sig  . acetaminophen (TYLENOL) 500 MG tablet Take 500 mg by mouth every 6 (six) hours as needed for mild pain.   Marland Kitchen apixaban (ELIQUIS) 5 MG TABS tablet Take 1 tablet (5 mg total) by mouth 2 (two) times daily.  . cholecalciferol (VITAMIN D) 1000 UNITS tablet Take 2,000 Units by mouth daily.   Marland Kitchen diltiazem (CARDIZEM CD) 120 MG 24 hr capsule Take 1 capsule (120  mg total) by mouth daily.  Marland Kitchen levothyroxine (SYNTHROID, LEVOTHROID) 75 MCG tablet Take 75 mcg by mouth daily before breakfast.  . NITROSTAT 0.4 MG SL tablet Place 1 tablet (0.4 mg total) under the tongue every 5 (five) minutes as needed. Chest pain  . Polyethyl Glycol-Propyl Glycol (SYSTANE ULTRA) 0.4-0.3 % SOLN Place 1-2 drops into both eyes 3 (three) times daily as needed (dry eyes).  . rosuvastatin (CRESTOR) 10 MG tablet Take 1 tablet (10 mg total) by mouth daily.  . [DISCONTINUED] sotalol (BETAPACE) 80 MG tablet Take 1 tablet (80 mg total) by mouth daily.     Allergies:   Anesthesia s-i-60   Social History   Tobacco Use  . Smoking status: Never Smoker  . Smokeless tobacco: Never Used  Substance Use Topics  . Alcohol use: No    Comment: occ.  . Drug use: No     Family Hx: The patient's family history includes Cancer in her sister; Heart attack in her father; Stroke in her mother.  ROS:   Please see the history of present illness.    Review of Systems  Respiratory: Positive for shortness of breath.    All other systems reviewed and are negative.   EKGs/Labs/Other Test Reviewed:    EKG:  EKG is   ordered today.  The ekg ordered today demonstrates AFlutter HR 96  Recent Labs: 04/21/2018: ALT 29; Hemoglobin 13.5; Platelets 234; TSH 2.863 04/24/2018: BUN 16; Creatinine, Ser 0.69; Magnesium 1.8; Potassium 4.1; Sodium 132   Recent Lipid Panel Lab Results  Component Value Date/Time   CHOL 135 02/21/2018 08:17 AM   TRIG 52 02/21/2018 08:17 AM   HDL 63 02/21/2018 08:17 AM   CHOLHDL 2.1 02/21/2018 08:17 AM   CHOLHDL 2.3 06/30/2015 08:48 AM   LDLCALC 62 02/21/2018 08:17 AM    Physical Exam:    VS:  BP 120/80   Pulse 96   Ht 5\' 2"  (1.575 m)   Wt 147 lb 6.4 oz (66.9 kg)   SpO2 96%   BMI 26.96 kg/m     Wt Readings from Last 3 Encounters:  05/03/18 147 lb 6.4 oz (66.9 kg)  04/24/18 144 lb 1.6 oz (65.4 kg)  11/20/17 140 lb (63.5 kg)     Physical Exam  Constitutional:  She is oriented to person, place, and time. She appears well-developed and well-nourished. No distress.  HENT:  Head: Normocephalic and atraumatic.  Eyes: No scleral icterus.  Neck: No JVD present. No thyromegaly present.  Cardiovascular: Normal rate. An irregularly irregular rhythm present.  No murmur heard. Pulmonary/Chest: Effort normal. She has no wheezes. She has no rales.  Abdominal: Soft. She exhibits no distension.  Musculoskeletal:        General: No edema.  Lymphadenopathy:    She has no cervical adenopathy.  Neurological: She is alert and oriented to person, place, and time.  Skin: Skin is warm and dry.  Psychiatric: She has  a normal mood and affect.    ASSESSMENT & PLAN:    Persistent atrial fibrillation S/p recent admission with AF with RVR.  Review of the notes indicates she had episodes of normal sinus rhythm during her admission.  She was DC in normal sinus rhythm on Sotalol.  She was placed on 80 mg QD based upon age and weight.  She is back in what appears to be AFlutter today.  Her HR is in the 90s.  She has recorded HRs up into the 100-120s at home.  She had an episode of what sounds like paroxysmal nocturnal dyspnea last night.  She does not appear volume overloaded on exam and her lungs are clear.  I suspect she had a rapid rate that awoke her last night.  She is now on anticoagulation with Apixaban.  She needs further management of her antiarrhythmic drug therapy.  I reviewed her case today with Dr. Harrington Challenger (attending MD).  She reviewed the case with Dr. Lovena Le (EP) by phone.  Due to her age and weight, she cannot take a higher dose of Sotalol.  She is obviously failing Rx with Sotalol.  Therefore, we will DC Sotalol.  Options for rhythm control include Dofetilide or Amiodarone.  I called her primary cardiologist, Dr. Acie Fredrickson to discuss options for rhythm control.  He prefers the patient see EP for further help with her atrial fibrillation.   -DC Sotalol  -Continue  Diltiazem  -Start metoprolol tartrate 25 mg Twice daily   -Refer to Dr. Lovena Le (EP) for evaluation of AFib (next week)  -Continue Apixaban  -Keep FU with Dr. Acie Fredrickson in mid March  Renal infarct [facility]) Presumed to be from AFib.  She is not on anticoagulation.  She will need a repeat CT in May to rule out mass as noted by the radiologist.    -Arrange CT Abd/Pelvis w and wo contrast in 07/2018  Coronary artery disease involving native coronary artery of native heart with angina pectoris ([place]) Hx of stent to the LAD in 2016.  Stent was patent by LHC in 2018.  She has occasional chest pain.  This has been a chronic symptom since her PCI.  But, she notes an increase in the last month.  I suspect her chest symptoms are related to rapid AFib.  She is not on ASA as she is on Apixaban.  Continue statin.    Mixed hyperlipidemia Continue statin.     Dispo:  Return in about 1 week (around 05/10/2018) for evaluation w/ Dr. Lovena Le .    Medication Adjustments/Labs and Tests Ordered: Current medicines are reviewed at length with the patient today.  Concerns regarding medicines are outlined above.  Tests Ordered: Orders Placed This Encounter  Procedures  . CT ABDOMEN PELVIS W WO CONTRAST  . Basic metabolic panel  . EKG 12-Lead   Medication Changes: Meds ordered this encounter  Medications  . metoprolol tartrate (LOPRESSOR) 25 MG tablet    Sig: Take 1 tablet (25 mg total) by mouth 2 (two) times daily.    Dispense:  180 tablet    Refill:  3    Signed, Richardson Dopp, PA-C  05/03/2018 2:05 PM    [address] [place], [place], [place]  [address] Phone: [phone]; Fax: [phone]

## 2018-05-03 NOTE — Patient Instructions (Addendum)
Medication Instructions:  Your physician has recommended you make the following change in your medication:  1. STOP SOTALOL  2. START METOPROLOL TARTRATE 25 MG TWICE DAILY.   If you need a refill on your cardiac medications before your next appointment, please call your pharmacy.   Lab work: NONE  If you have labs (blood work) drawn today and your tests are completely normal, you will receive your results only by: Marland Kitchen MyChart Message (if you have MyChart) OR . A paper copy in the mail If you have any lab test that is abnormal or we need to change your treatment, we will call you to review the results.  TESTING: Your physician recommends that you have a CT of Abdomen and Pelvis in May 2020. Non-Cardiac CT scanning, (CAT scanning), is a noninvasive, special x-ray that produces cross-sectional images of the body using x-rays and a computer. CT scans help physicians diagnose and treat medical conditions. For some CT exams, a contrast material is used to enhance visibility in the area of the body being studied. CT scans provide greater clarity and reveal more details than regular x-ray exams.    Follow-Up: At [facility], you and your health needs are our priority.  As part of our continuing mission to provide you with exceptional heart care, we have created designated Provider Care Teams.  These Care Teams include your primary Cardiologist (physician) and Advanced Practice Providers (APPs -  Physician Assistants and Nurse Practitioners) who all work together to provide you with the care you need, when you need it. You will need a follow up appointment in:  1 week with Mertie Moores, MD   Any Other Special Instructions Will Be Listed Below (If Applicable).

## 2018-05-06 ENCOUNTER — Telehealth: Payer: Self-pay | Admitting: Cardiovascular Disease

## 2018-05-06 NOTE — Telephone Encounter (Signed)
New Message    PT is returning a phone call. She said it is suppose to be for an appointment to be scheduled with Dr Acie Fredrickson this week for her Afib Please call back

## 2018-05-06 NOTE — Telephone Encounter (Signed)
Spoke with patient who states she thought she missed a call from our office earlier regarding an appointment. I advised that I have reviewed the plan of care with Dr. Acie Fredrickson and Richardson Dopp, PA and patient will be scheduled to see Dr. Curt Bears on Monday 3/9 for management of a fib. Her appointment with Dr. Acie Fredrickson will be postponed to April. Patient reports HR erratic today from 69 to 145 bpm. She reports compliance with medications and denies symptoms of distress at present. She states at times she can tell that her HR is very fast because she becomes SOB but not currently. I advised her to continue to monitor and call back with questions or concerns. She verbalized understanding and agreement and thanked me for the call.

## 2018-05-09 DIAGNOSIS — R69 Illness, unspecified: Secondary | ICD-10-CM | POA: Diagnosis not present

## 2018-05-13 ENCOUNTER — Telehealth: Payer: Self-pay | Admitting: Cardiology

## 2018-05-13 ENCOUNTER — Encounter: Payer: Self-pay | Admitting: Cardiology

## 2018-05-13 ENCOUNTER — Ambulatory Visit: Payer: Medicare HMO | Admitting: Cardiology

## 2018-05-13 VITALS — BP 122/82 | HR 82 | Ht 62.0 in | Wt 144.0 lb

## 2018-05-13 DIAGNOSIS — I4819 Other persistent atrial fibrillation: Secondary | ICD-10-CM | POA: Diagnosis not present

## 2018-05-13 NOTE — Telephone Encounter (Signed)
Pt confirms that she can afford the medication and would like to go forward w/ arranging adx. Pt aware I will forward this to our pharmD, to review her medication list, & to AFib clinic. Pt aware once advised by pharmD the AFib clinic will contact her to arrange OV & adx. Pt agreeable to plan.

## 2018-05-13 NOTE — Progress Notes (Signed)
Electrophysiology Office Note   Date:  05/13/2018   ID:  Alnita Aybar, DOB May 02, 1931, MRN [id number]  PCP:  Lajean Manes, MD  Cardiologist:  Quentin Mulling Primary Electrophysiologist:  Constance Haw, MD    No chief complaint on file.    History of Present Illness: Elverta Dimiceli is a 85 y.o. female who is being seen today for the evaluation of atrial fibrillation at the request of Nahser, Wonda Cheng, MD. Presenting today for electrophysiology evaluation.  Has a history of coronary artery disease status post LAD stent complicated by CVA in 7829, atrial fibrillation.  She has been taken off of Eliquis due to prior GI bleeding.  She was admitted 2 16-2 19 with rapid atrial fibrillation.  She presented with abdominal pain and CT scan confirmed renal infarctions.  She was placed back on Eliquis.  She was also placed on sotalol.  She did convert back to sinus rhythm.  Today, she denies symptoms of palpitations, chest pain, PND, lower extremity edema, claudication, dizziness, presyncope, syncope, bleeding, or neurologic sequela. The patient is tolerating medications without difficulties.  Her main symptoms are shortness of breath, weakness, and fatigue.  She used to have palpitations, but her palpitations have gone away over the last few weeks.  She has been on Eliquis and has tolerated that well.  She does get short of breath when she lays flat.  Her heart rate has been well controlled, though she continues to have symptoms.   Past Medical History:  Diagnosis Date  . Anginal pain ([place])   . Arthritis   . Complication of anesthesia   . Coronary artery disease   . Dyspnea    exertion  . Hypertension   . Hypothyroidism   . PAF (paroxysmal atrial fibrillation) ([place])    on Xarelto for maybe a year, opted to stop  . PONV (postoperative nausea and vomiting)   . Renal infarction ([place])   . Small bowel obstruction ([place]) 06/2015  . Stroke [facility])    oct 2016  . TIA (transient ischemic attack)  03/24/2015  . Vertigo, benign positional    to be evaluated, intermittent   Past Surgical History:  Procedure Laterality Date  . ABDOMINAL HYSTERECTOMY    . CARDIAC CATHETERIZATION N/A 12/24/2014   Procedure: Left Heart Cath and Coronary Angiography;  Surgeon: Sherren Mocha, MD;  Location: [place] [facility];  Service: Cardiovascular;  Laterality: N/A;  . CATARACT EXTRACTION Bilateral   . COLONOSCOPY WITH PROPOFOL N/A 12/01/2013   Procedure: COLONOSCOPY WITH PROPOFOL;  Surgeon: Garlan Fair, MD;  Location: [facility];  Service: Endoscopy;  Laterality: N/A;  . INCISIONAL HERNIA REPAIR N/A 03/10/2016   Procedure: INCISIONAL HERNIA REPAIR TIMES TWO;  Surgeon: Armandina Gemma, MD;  Location: [place];  Service: General;  Laterality: N/A;  . INGUINAL HERNIA REPAIR Bilateral 03/10/2016   Procedure: BILATERAL INGUINAL HERNIA REPAIRS;  Surgeon: Armandina Gemma, MD;  Location: [place];  Service: General;  Laterality: Bilateral;  . INSERTION OF MESH N/A 03/10/2016   Procedure: INSERTION OF MESH TO BILATERAL GROINS AND ABDOMEN;  Surgeon: Armandina Gemma, MD;  Location: [place];  Service: General;  Laterality: N/A;  . laparotomy  06/2015  . LEFT HEART CATH AND CORONARY ANGIOGRAPHY N/A 09/27/2016   Procedure: Left Heart Cath and Coronary Angiography;  Surgeon: Sherren Mocha, MD;  Location: [place] [facility];  Service: Cardiovascular;  Laterality: N/A;  . TONSILLECTOMY       Current Outpatient Medications  Medication Sig Dispense Refill  .  acetaminophen (TYLENOL) 500 MG tablet Take 500 mg by mouth every 6 (six) hours as needed for mild pain.     Marland Kitchen apixaban (ELIQUIS) 5 MG TABS tablet Take 1 tablet (5 mg total) by mouth 2 (two) times daily. 60 tablet 0  . cholecalciferol (VITAMIN D) 1000 UNITS tablet Take 2,000 Units by mouth daily.     Marland Kitchen diltiazem (CARDIZEM CD) 120 MG 24 hr capsule Take 1 capsule (120 mg total) by mouth daily. 30 capsule 0  . levothyroxine (SYNTHROID, LEVOTHROID) 75 MCG tablet Take 75 mcg by mouth  daily before breakfast.    . metoprolol tartrate (LOPRESSOR) 25 MG tablet Take 1 tablet (25 mg total) by mouth 2 (two) times daily. 180 tablet 3  . NITROSTAT 0.4 MG SL tablet Place 1 tablet (0.4 mg total) under the tongue every 5 (five) minutes as needed. Chest pain 25 tablet 6  . Polyethyl Glycol-Propyl Glycol (SYSTANE ULTRA) 0.4-0.3 % SOLN Place 1-2 drops into both eyes 3 (three) times daily as needed (dry eyes).    . rosuvastatin (CRESTOR) 10 MG tablet Take 1 tablet (10 mg total) by mouth daily. 90 tablet 3   No current facility-administered medications for this visit.     Allergies:   Anesthesia s-i-60   Social History:  The patient  reports that she has never smoked. She has never used smokeless tobacco. She reports that she does not drink alcohol or use drugs.   Family History:  The patient's family history includes Cancer in her sister; Heart attack in her father; Stroke in her mother.    ROS:  Please see the history of present illness.   Otherwise, review of systems is positive for shortness of breath, palpitations, abdominal pain.   All other systems are reviewed and negative.    PHYSICAL EXAM: VS:  BP 122/82   Pulse 82   Ht 5\' 2"  (1.575 m)   Wt 144 lb (65.3 kg)   SpO2 96%   BMI 26.34 kg/m  , BMI Body mass index is 26.34 kg/m. GEN: Well nourished, well developed, in no acute distress  HEENT: normal  Neck: no JVD, carotid bruits, or masses Cardiac: RRR; no murmurs, rubs, or gallops,no edema  Respiratory:  clear to auscultation bilaterally, normal work of breathing GI: soft, nontender, nondistended, + BS MS: no deformity or atrophy  Skin: warm and dry Neuro:  Strength and sensation are intact Psych: euthymic mood, full affect  EKG:  EKG is not ordered today. Personal review of the ekg ordered 05/03/18 shows atrial flutter, rate 96  Recent Labs: 04/21/2018: ALT 29; Hemoglobin 13.5; Platelets 234; TSH 2.863 04/24/2018: BUN 16; Creatinine, Ser 0.69; Magnesium 1.8;  Potassium 4.1; Sodium 132    Lipid Panel     Component Value Date/Time   CHOL 135 02/21/2018 0817   TRIG 52 02/21/2018 0817   HDL 63 02/21/2018 0817   CHOLHDL 2.1 02/21/2018 0817   CHOLHDL 2.3 06/30/2015 0848   VLDL 14 06/30/2015 0848   LDLCALC 62 02/21/2018 0817     Wt Readings from Last 3 Encounters:  05/13/18 144 lb (65.3 kg)  05/03/18 147 lb 6.4 oz (66.9 kg)  04/24/18 144 lb 1.6 oz (65.4 kg)      Other studies Reviewed: Additional studies/ records that were reviewed today include: TTE 04/21/18  Review of the above records today demonstrates:   1. The left ventricle has normal systolic function, with an ejection fraction of 55-60%. The cavity size was normal. Mild basal septal hypertrophy.  Left ventricular diastology could not be evaluated secondary to atrial fibrillation.  2. The right ventricle has normal systolic function. The cavity was normal. There is no increase in right ventricular wall thickness.  3. Left atrial size was moderately dilated.  4. The mitral valve is normal in structure.  5. The tricuspid valve is normal in structure. Tricuspid valve regurgitation is mild-moderate.  6. The aortic valve is tricuspid Mild sclerosis of the aortic valve.  7. The pulmonic valve was normal in structure. Pulmonic valve regurgitation is mild by color flow Doppler.  8. Right atrial pressure is estimated at 3 mmHg.   ASSESSMENT AND PLAN:  1.  Persistent atrial fibrillation: Currently on Eliquis.  Was admitted to the hospital with A. fib and rapid rates.  She presented back to cardiology clinic in atrial flutter with heart rates up to the 120s at home.  She also has some PND.  Her sotalol has thus been stopped.  At this point, I do feel like she would be much improved with sinus rhythm.  I offered her amiodarone versus dofetilide loading.  At this point, I feel that dofetilide would likely be her best option she would like to avoid side effects with amiodarone.  Her QTC is  certainly not prolonged.  We Kwamaine Cuppett thus arrange for admission for dofetilide load.  This patients CHA2DS2-VASc Score and unadjusted Ischemic Stroke Rate (% per year) is equal to 9.7 % stroke rate/year from a score of 6  Above score calculated as 1 point each if present [CHF, HTN, DM, Vascular=MI/PAD/Aortic Plaque, Age if 65-74, or Female] Above score calculated as 2 points each if present [Age > 75, or Stroke/TIA/TE]  2.  Coronary artery disease: Status post LAD stent in 0962 complicated by CVA.  No current chest pain    Current medicines are reviewed at length with the patient today.   The patient does not have concerns regarding her medicines.  The following changes were made today: Arrange for dofetilide loading  Labs/ tests ordered today include:  No orders of the defined types were placed in this encounter.  Case discussed with primary cardiology  Disposition:   FU with Emmory Solivan 3 months  Signed, Levora Werden Meredith Leeds, MD  05/13/2018 9:29 AM     [facility] [address] [place] [address] [phone] (office) [phone] (fax)

## 2018-05-13 NOTE — Telephone Encounter (Signed)
Medication list reviewed in anticipation of upcoming Tikosyn initiation. Patient is not taking any contraindicated or QTc prolonging medications.   Patient is anticoagulated on apixaban on the appropriate dose. Please ensure that patient has not missed any anticoagulation doses in the 3 weeks prior to Tikosyn initiation.   Patient will need to be counseled to avoid use of Benadryl while on Tikosyn and in the 2-3 days prior to Tikosyn initiation.  Patient magnesium and potassium are in acceptable range per labs on 2/19. Please ensure K is >4 and Mg >1.8 upon admission.

## 2018-05-13 NOTE — Patient Instructions (Signed)
Medication Instructions:  Your physician recommends that you continue on your current medications as directed. Please refer to the Current Medication list given to you today.  * If you need a refill on your cardiac medications before your next appointment, please call your pharmacy.   Labwork: None ordered  Testing/Procedures: None ordered  Follow-Up: Dr. Curt Bears recommends starting Tikosyn.  Please review information sheet given to you, call and check on the cost of the drug.  Call the office to let Trinidad Curet, RN if this is affordable and we will arrange hospitalization.   *Please note that any paperwork needing to be filled out by the provider will need to be addressed at the front desk prior to seeing the provider. Please note that any FMLA, disability or other documents regarding health condition is subject to a $25.00 charge that must be received prior to completion of paperwork in the form of a money order or check.  Thank you for choosing [facility]!!   Trinidad Curet, RN [phone]  Any Other Special Instructions Will Be Listed Below (If Applicable).  Dofetilide capsules What is this medicine? DOFETILIDE (doe FET il ide) is an antiarrhythmic drug. It helps make your heart beat regularly. This medicine also helps to slow rapid heartbeats. This medicine may be used for other purposes; ask your health care provider or pharmacist if you have questions. COMMON BRAND NAME(S): Tikosyn What should I tell my health care provider before I take this medicine? They need to know if you have any of these conditions: -heart disease -history of irregular heartbeat -history of low levels of potassium or magnesium in the blood -kidney disease -liver disease -an unusual or allergic reaction to dofetilide, other medicines, foods, dyes, or preservatives -pregnant or trying to get pregnant -breast-feeding How should I use this medicine? Take this medicine by mouth with a glass of  water. Follow the directions on the prescription label. Do not take with grapefruit juice. You can take it with or without food. If it upsets your stomach, take it with food. Take your medicine at regular intervals. Do not take it more often than directed. Do not stop taking except on your doctor's advice. A special MedGuide will be given to you by the pharmacist with each prescription and refill. Be sure to read this information carefully each time. Talk to your pediatrician regarding the use of this medicine in children. Special care may be needed. Overdosage: If you think you have taken too much of this medicine contact a poison control center or emergency room at once. NOTE: This medicine is only for you. Do not share this medicine with others. What if I miss a dose? If you miss a dose, skip it. Take your next dose at the normal time. Do not take extra or 2 doses at the same time to make up for the missed dose. What may interact with this medicine? Do not take this medicine with any of the following medications: -cimetidine -cisapride -dolutegravir -dronedarone -hydrochlorothiazide -ketoconazole -megestrol -pimozide -prochlorperazine -thioridazine -trimethoprim -verapamil This medicine may also interact with the following medications: -amiloride -cannabinoids -certain antibiotics like erythromycin or clarithromycin -certain antiviral medicines for HIV or hepatitis -certain medicines for depression, anxiety, or psychotic disorders -digoxin -diltiazem -grapefruit juice -metformin -nefazodone -other medicines that prolong the QT interval (an abnormal heart rhythm) -quinine -triamterene -zafirlukast This list may not describe all possible interactions. Give your health care provider a list of all the medicines, herbs, non-prescription drugs, or dietary supplements you use.  Also tell them if you smoke, drink alcohol, or use illegal drugs. Some items may interact with your  medicine. What should I watch for while using this medicine? Your condition will be monitored carefully while you are receiving this medicine. What side effects may I notice from receiving this medicine? Side effects that you should report to your doctor or health care professional as soon as possible: -allergic reactions like skin rash, itching or hives, swelling of the face, lips, or tongue -breathing problems -chest pain or chest tightness -dizziness -signs and symptoms of a dangerous change in heartbeat or heart rhythm like chest pain; dizziness; fast or irregular heartbeat; palpitations; feeling faint or lightheaded, falls; breathing problems -signs and symptoms of electrolyte imbalance like severe diarrhea, unusual sweating, vomiting, loss of appetite, increased thirst -swelling of the ankles, legs, or feet -tingling, numbness in the hands or feet Side effects that usually do not require medical attention (report to your doctor or health care professional if they continue or are bothersome): -diarrhea -general ill feeling or flu-like symptoms -headache -nausea -trouble sleeping -stomach pain This list may not describe all possible side effects. Call your doctor for medical advice about side effects. You may report side effects to FDA at 1-800-FDA-1088. Where should I keep my medicine? Keep out of the reach of children. Store at room temperature between 15 and 30 degrees C (59 and 86 degrees F). Throw away any unused medicine after the expiration date. NOTE: This sheet is a summary. It may not cover all possible information. If you have questions about this medicine, talk to your doctor, pharmacist, or health care provider.  2019 Elsevier/Gold Standard (2017-10-11 11:48:00)

## 2018-05-13 NOTE — Telephone Encounter (Signed)
New Message    Dr. Donnetta Hutching is calling on behalf of his mother. He is wanting to speak with Dr. Curt Bears about his moms appt from today. Please call.

## 2018-05-13 NOTE — Telephone Encounter (Signed)
Patient called about want she dicussed with Dr. Curt Bears this morning about going into the hospital the dofetilide.  She said she is ok with it she called the insurance company and confirmed the cost.

## 2018-05-14 NOTE — Telephone Encounter (Signed)
Dr. Curt Bears called and spoke with Dr. Donnetta Hutching this morning.

## 2018-05-20 ENCOUNTER — Inpatient Hospital Stay (HOSPITAL_COMMUNITY)
Admission: RE | Admit: 2018-05-20 | Discharge: 2018-05-23 | DRG: 310 | Disposition: A | Discharge status: Home / Self Care | Payer: Medicare HMO | Source: Ambulatory Visit | Attending: Internal Medicine | Admitting: Internal Medicine

## 2018-05-20 ENCOUNTER — Encounter (HOSPITAL_COMMUNITY): Payer: Self-pay | Admitting: Physician Assistant

## 2018-05-20 ENCOUNTER — Other Ambulatory Visit: Payer: Self-pay

## 2018-05-20 ENCOUNTER — Ambulatory Visit: Payer: Medicare HMO | Admitting: Cardiovascular Disease

## 2018-05-20 ENCOUNTER — Ambulatory Visit (HOSPITAL_COMMUNITY)
Admission: RE | Admit: 2018-05-20 | Discharge: 2018-05-20 | Disposition: A | Discharge status: Home / Self Care | Payer: Medicare HMO | Source: Ambulatory Visit | Attending: Physician Assistant | Admitting: Physician Assistant

## 2018-05-20 VITALS — BP 126/70 | HR 109 | Ht 62.0 in | Wt 147.0 lb

## 2018-05-20 DIAGNOSIS — Z7901 Long term (current) use of anticoagulants: Secondary | ICD-10-CM

## 2018-05-20 DIAGNOSIS — I10 Essential (primary) hypertension: Secondary | ICD-10-CM | POA: Diagnosis present

## 2018-05-20 DIAGNOSIS — Z79890 Hormone replacement therapy (postmenopausal): Secondary | ICD-10-CM

## 2018-05-20 DIAGNOSIS — Z8673 Personal history of transient ischemic attack (TIA), and cerebral infarction without residual deficits: Secondary | ICD-10-CM | POA: Diagnosis not present

## 2018-05-20 DIAGNOSIS — Z90710 Acquired absence of both cervix and uterus: Secondary | ICD-10-CM

## 2018-05-20 DIAGNOSIS — I2510 Atherosclerotic heart disease of native coronary artery without angina pectoris: Secondary | ICD-10-CM | POA: Diagnosis present

## 2018-05-20 DIAGNOSIS — Z955 Presence of coronary angioplasty implant and graft: Secondary | ICD-10-CM

## 2018-05-20 DIAGNOSIS — I4819 Other persistent atrial fibrillation: Secondary | ICD-10-CM | POA: Diagnosis not present

## 2018-05-20 DIAGNOSIS — I4892 Unspecified atrial flutter: Secondary | ICD-10-CM | POA: Diagnosis present

## 2018-05-20 DIAGNOSIS — Z8249 Family history of ischemic heart disease and other diseases of the circulatory system: Secondary | ICD-10-CM

## 2018-05-20 DIAGNOSIS — Z79899 Other long term (current) drug therapy: Secondary | ICD-10-CM | POA: Diagnosis not present

## 2018-05-20 DIAGNOSIS — E039 Hypothyroidism, unspecified: Secondary | ICD-10-CM | POA: Diagnosis present

## 2018-05-20 DIAGNOSIS — I4891 Unspecified atrial fibrillation: Secondary | ICD-10-CM | POA: Diagnosis not present

## 2018-05-20 LAB — BASIC METABOLIC PANEL
Anion gap: 6 (ref 5–15)
BUN: 16 mg/dL (ref 8–23)
CO2: 25 mmol/L (ref 22–32)
Calcium: 9 mg/dL (ref 8.9–10.3)
Chloride: 104 mmol/L (ref 98–111)
Creatinine, Ser: 0.67 mg/dL (ref 0.44–1.00)
GFR calc Af Amer: >60 mL/min (ref >60)
GFR calc non Af Amer: >60 mL/min (ref >60)
Glucose, Bld: 105 mg/dL — ABNORMAL HIGH (ref 70–99)
Potassium: 4.5 mmol/L (ref 3.5–5.1)
Sodium: 135 mmol/L (ref 135–145)

## 2018-05-20 LAB — MAGNESIUM: MAGNESIUM: 2 mg/dL (ref 1.7–2.4)

## 2018-05-20 MED ORDER — NITROGLYCERIN 0.4 MG SL SUBL: 0.4000 mg | SUBLINGUAL_TABLET | SUBLINGUAL | Status: DC | PRN

## 2018-05-20 MED ORDER — SODIUM CHLORIDE 0.9 % IV SOLN: 250.0000 mL | INTRAVENOUS | Status: DC | PRN

## 2018-05-20 MED ORDER — VITAMIN D 25 MCG (1000 UNIT) PO TABS
2000.0000 [IU] | ORAL_TABLET | Freq: Every day | ORAL | Status: DC
  Administered 2018-05-20 – 2018-05-23 (×4): 2000 [IU] via ORAL
  Filled 2018-05-20 (×6): qty 2

## 2018-05-20 MED ORDER — DILTIAZEM HCL ER COATED BEADS 120 MG PO CP24
120.0000 mg | ORAL_CAPSULE | Freq: Every day | ORAL | Status: DC
  Administered 2018-05-21 – 2018-05-23 (×3): 120 mg via ORAL
  Filled 2018-05-20 (×3): qty 1

## 2018-05-20 MED ORDER — ROSUVASTATIN CALCIUM 5 MG PO TABS
10.0000 mg | ORAL_TABLET | Freq: Every day | ORAL | Status: DC
  Administered 2018-05-20 – 2018-05-23 (×4): 10 mg via ORAL
  Filled 2018-05-20 (×4): qty 2

## 2018-05-20 MED ORDER — SODIUM CHLORIDE 0.9% FLUSH: 3.0000 mL | INTRAVENOUS | Status: DC | PRN

## 2018-05-20 MED ORDER — LEVOTHYROXINE SODIUM 75 MCG PO TABS
75.0000 ug | ORAL_TABLET | Freq: Every day | ORAL | Status: DC
  Administered 2018-05-21 – 2018-05-23 (×3): 75 ug via ORAL
  Filled 2018-05-20 (×3): qty 1

## 2018-05-20 MED ORDER — METOPROLOL TARTRATE 25 MG PO TABS
25.0000 mg | ORAL_TABLET | Freq: Two times a day (BID) | ORAL | Status: DC
  Administered 2018-05-20 – 2018-05-23 (×6): 25 mg via ORAL
  Filled 2018-05-20 (×6): qty 1

## 2018-05-20 MED ORDER — POLYVINYL ALCOHOL 1.4 % OP SOLN
1.0000 [drp] | Freq: Three times a day (TID) | OPHTHALMIC | Status: DC | PRN
  Filled 2018-05-20: qty 15

## 2018-05-20 MED ORDER — DOFETILIDE 500 MCG PO CAPS
500.0000 ug | ORAL_CAPSULE | Freq: Two times a day (BID) | ORAL | Status: DC
  Administered 2018-05-20 – 2018-05-23 (×6): 500 ug via ORAL
  Filled 2018-05-20 (×6): qty 1

## 2018-05-20 MED ORDER — ACETAMINOPHEN 500 MG PO TABS: 500.0000 mg | ORAL_TABLET | Freq: Four times a day (QID) | ORAL | Status: DC | PRN

## 2018-05-20 MED ORDER — APIXABAN 5 MG PO TABS
5.0000 mg | ORAL_TABLET | Freq: Two times a day (BID) | ORAL | Status: DC
  Administered 2018-05-20 – 2018-05-23 (×6): 5 mg via ORAL
  Filled 2018-05-20 (×6): qty 1

## 2018-05-20 MED ORDER — SODIUM CHLORIDE 0.9% FLUSH
3.0000 mL | Freq: Two times a day (BID) | INTRAVENOUS | Status: DC
  Administered 2018-05-20 – 2018-05-22 (×6): 3 mL via INTRAVENOUS

## 2018-05-20 MED ORDER — DOFETILIDE 500 MCG PO CAPS: 500.0000 ug | ORAL_CAPSULE | Freq: Two times a day (BID) | ORAL | Status: DC

## 2018-05-20 NOTE — TOC Benefit Eligibility Note (Signed)
Transition of Care [facility]) Benefit Eligibility Note    Patient Details  Name: Rachel Park MRN: [id number] Date of Birth: December 08, 1931   Medication/Dose:  Phyllis Ginger Generic (Dofetilide) 500 mcg   Covered?: Yes  Tier: Other  Prescription Coverage Preferred Pharm[facility]: [facility]   Spoke with Person/Company/Phone Number:: Latricia Heft. W/ [employer]  Co-Pay: $100.00  for 30 day suppply,  90 day supply mail order [facility] $299.51  Prior Approval: No     Additional Notes: No deductible for the medication    Shelda Altes Phone Number: 05/20/2018, 1:23 PM

## 2018-05-20 NOTE — H&P (Signed)
Primary Care Physician: Lajean Manes, MD Primary Cardiologist: Dr Acie Fredrickson Primary Electrophysiologist: Dr Curt Bears Referring Physician: Dr Curt Bears   Rachel Park is a 85 y.o. female with a history of CAD s/p PCI to LAD, CVA, and persistent atrial fibrillation who presents for consultation in the [facility]. Patient had been previously maintained on sotalol but recently had more elevated HR and PND. Seen by Dr Curt Bears and it was decided to start Tikosyn. She is in afib today.  Today, she denies symptoms of palpitations, chest pain, shortness of breath, orthopnea, lower extremity edema, dizziness, presyncope, syncope, snoring, daytime somnolence, bleeding, or neurologic sequela. The patient is tolerating medications without difficulties and is otherwise without complaint today.    Atrial Fibrillation Risk Factors:  she does not have symptoms or diagnosis of sleep apnea. she does not have a history of rheumatic fever. she does not have a history of alcohol use. The patient does not have a history of early familial atrial fibrillation or other arrhythmias.  she has a BMI of Body mass index is 26.89 kg/m.Marland Kitchen    Filed Weights   05/20/18 1017  Weight: 66.7 kg         Family History  Problem Relation Age of Onset   Heart attack Father    Stroke Mother    Cancer Sister        breast     Atrial Fibrillation Management history:  Previous antiarrhythmic drugs: sotalol Previous cardioversions: none Previous ablations: none CHADS2VASC score: 6 Anticoagulation history: Eliquis       Past Medical History:  Diagnosis Date   Anginal pain ([place])    Arthritis    Complication of anesthesia    Coronary artery disease    Dyspnea    exertion   Hypertension    Hypothyroidism    PAF (paroxysmal atrial fibrillation) ([place])    on Xarelto for maybe a year, opted to stop   PONV (postoperative nausea and vomiting)      Renal infarction ([place])    Small bowel obstruction ([place]) 06/2015   Stroke ([place])    oct 2016   TIA (transient ischemic attack) 03/24/2015   Vertigo, benign positional    to be evaluated, intermittent        Past Surgical History:  Procedure Laterality Date   ABDOMINAL HYSTERECTOMY     CARDIAC CATHETERIZATION N/A 12/24/2014   Procedure: Left Heart Cath and Coronary Angiography;  Surgeon: Sherren Mocha, MD;  Location: [facility];  Service: Cardiovascular;  Laterality: N/A;   CATARACT EXTRACTION Bilateral    COLONOSCOPY WITH PROPOFOL N/A 12/01/2013   Procedure: COLONOSCOPY WITH PROPOFOL;  Surgeon: Garlan Fair, MD;  [facility];  Service: Endoscopy;  Laterality: N/A;   INCISIONAL HERNIA REPAIR N/A 03/10/2016   Procedure: INCISIONAL HERNIA REPAIR TIMES TWO;  Surgeon: Armandina Gemma, MD;  Location: [facility];  Service: General;  Laterality: N/A;   INGUINAL HERNIA REPAIR Bilateral 03/10/2016   Procedure: BILATERAL INGUINAL HERNIA REPAIRS;  Surgeon: Armandina Gemma, MD;  Location: [facility];  Service: General;  Laterality: Bilateral;   INSERTION OF MESH N/A 03/10/2016   Procedure: INSERTION OF MESH TO BILATERAL GROINS AND ABDOMEN;  Surgeon: Armandina Gemma, MD;  Location: [facility];  Service: General;  Laterality: N/A;   laparotomy  06/2015   LEFT HEART CATH AND CORONARY ANGIOGRAPHY N/A 09/27/2016   Procedure: Left Heart Cath and Coronary Angiography;  Surgeon: Sherren Mocha, MD;  Location: [facility];  Service:  Cardiovascular;  Laterality: N/A;   TONSILLECTOMY            Current Outpatient Medications  Medication Sig Dispense Refill   acetaminophen (TYLENOL) 500 MG tablet Take 500 mg by mouth every 6 (six) hours as needed for mild pain.      apixaban (ELIQUIS) 5 MG TABS tablet Take 1 tablet (5 mg total) by mouth 2 (two) times daily. 60 tablet 0   cholecalciferol (VITAMIN D) 1000 UNITS tablet Take 2,000 Units by mouth daily.      diltiazem  (CARDIZEM CD) 120 MG 24 hr capsule Take 1 capsule (120 mg total) by mouth daily. 30 capsule 0   levothyroxine (SYNTHROID, LEVOTHROID) 75 MCG tablet Take 75 mcg by mouth daily before breakfast.     metoprolol tartrate (LOPRESSOR) 25 MG tablet Take 1 tablet (25 mg total) by mouth 2 (two) times daily. 180 tablet 3   Polyethyl Glycol-Propyl Glycol (SYSTANE ULTRA) 0.4-0.3 % SOLN Place 1-2 drops into both eyes 3 (three) times daily as needed (dry eyes).     rosuvastatin (CRESTOR) 10 MG tablet Take 1 tablet (10 mg total) by mouth daily. 90 tablet 3   NITROSTAT 0.4 MG SL tablet Place 1 tablet (0.4 mg total) under the tongue every 5 (five) minutes as needed. Chest pain (Patient not taking: Reported on 05/20/2018) 25 tablet 6   No current facility-administered medications for this encounter.          Allergies  Allergen Reactions   Anesthesia S-I-60 Nausea And Vomiting    "with hysterectomy years back had post op N/V"    Social History        Socioeconomic History   Marital status: Divorced    Spouse name: Not on file   Number of children: 2   Years of education: HS   Highest education level: Not on file  Occupational History   Occupation: retired  Scientist, product/process development strain: Not on file   Food insecurity:    Worry: Not on file    Inability: Not on Lexicographer needs:    Medical: Not on file    Non-medical: Not on file  Tobacco Use   Smoking status: Never Smoker   Smokeless tobacco: Never Used  Substance and Sexual Activity   Alcohol use: No    Comment: occ.   Drug use: No   Sexual activity: Not on file  Lifestyle   Physical activity:    Days per week: Not on file    Minutes per session: Not on file   Stress: Not on file  Relationships   Social connections:    Talks on phone: Not on file    Gets together: Not on file    Attends religious service: Not on file    Active member of club or  organization: Not on file    Attends meetings of clubs or organizations: Not on file    Relationship status: Not on file   Intimate partner violence:    Fear of current or ex partner: Not on file    Emotionally abused: Not on file    Physically abused: Not on file    Forced sexual activity: Not on file  Other Topics Concern   Not on file  Social History Narrative   Patient drinks 3-4 cups of caffeine daily.   Patient is right handed.      ROS- All systems are reviewed and negative except as per the HPI above.  Physical  Exam:    Vitals:   05/20/18 1017  BP: 126/70  Pulse: (!) 109  Weight: 66.7 kg  Height: 5\' 2"  (1.575 m)    GEN- The patient is well appearing, elderly female, alert and oriented x 3 today.   Head- normocephalic, atraumatic Eyes-  Sclera clear, conjunctiva pink Ears- hearing intact Oropharynx- clear Neck- supple  Lungs- Clear to ausculation bilaterally, normal work of breathing Heart- irregular rate and rhythm, no murmurs, rubs or gallops  GI- soft, NT, ND, + BS Extremities- no clubbing, cyanosis, or edema MS- no significant deformity or atrophy Skin- no rash or lesion Psych- euthymic mood, full affect Neuro- strength and sensation are intact     Wt Readings from Last 3 Encounters:  05/20/18 66.7 kg  05/13/18 65.3 kg  05/03/18 66.9 kg    EKG today demonstrates afib HR 109, QRS 72, QTc 441  Echo 04/21/18 demonstrated  1. The left ventricle has normal systolic function, with an ejection fraction of 55-60%. The cavity size was normal. Mild basal septal hypertrophy. Left ventricular diastology could not be evaluated secondary to atrial fibrillation. 2. The right ventricle has normal systolic function. The cavity was normal. There is no increase in right ventricular wall thickness. 3. Left atrial size was moderately dilated. 4. The mitral valve is normal in structure. 5. The tricuspid valve is normal in structure.  Tricuspid valve regurgitation is mild-moderate. 6. The aortic valve is tricuspid Mild sclerosis of the aortic valve. 7. The pulmonic valve was normal in structure. Pulmonic valve regurgitation is mild by color flow Doppler. 8. Right atrial pressure is estimated at 3 mmHg.  Epic records are reviewed at length today  Assessment and Plan:  1. Persistent atrial fibrillation/atrial flutter The patient has persistent atrial fibrillation. Has failed sotalol. Patient wants to pursue dofetilide, aware of risks and benefits. Aware of price of dofetilide and reports she can afford it. Patient will continue on anticoagulation, states no missed doses. General precautions given. No benadryl use PharmD has screened drugs and no QT prolonging drugs on board QTc in SR 428 ms, Labs today show creatinine at 0.67, K+ 4.5 and mag 2.0, CrCl calculated at 66 mL/min   This patients CHA2DS2-VASc Score and unadjusted Ischemic Stroke Rate (% per year) is equal to 9.7 % stroke rate/year from a score of 6  Above score calculated as 1 point each if present [CHF, HTN, DM, Vascular=MI/PAD/Aortic Plaque, Age if 65-74, or Female] Above score calculated as 2 points each if present [Age > 75, or Stroke/TIA/TE]   2. CAD S/p PCI to LAD 4665 complicated by CVA. No anginal symptoms.  3. HTN Stable, no changes today.  To be admitted later today when bed is available.  [facility] [address] [place], [place] [address] [phone] 05/20/2018 10:39 AM  EP Attending Patient seen and examined. Agree with the findings as noted above. The patient presents today for initiation of dofetilide for persistent atrial fib. Her ECG in NSR demonstrates a normal QT interval. She will have her QT interval followed. We will plan for DCCV if she does not convert to NSR on dofetilide.  Mikle Bosworth.D.

## 2018-05-20 NOTE — Progress Notes (Signed)
Primary Care Physician: Lajean Manes, MD Primary Cardiologist: Dr Acie Fredrickson Primary Electrophysiologist: Dr Curt Bears Referring Physician: Dr Curt Bears   Rachel Park is a 85 y.o. female with a history of CAD s/p PCI to LAD, CVA, and persistent atrial fibrillation who presents for consultation in the [facility]. Patient had been previously maintained on sotalol but recently had more elevated HR and PND. Seen by Dr Curt Bears and it was decided to start Tikosyn. She is in afib today.  Today, she denies symptoms of palpitations, chest pain, shortness of breath, orthopnea, lower extremity edema, dizziness, presyncope, syncope, snoring, daytime somnolence, bleeding, or neurologic sequela. The patient is tolerating medications without difficulties and is otherwise without complaint today.    Atrial Fibrillation Risk Factors:  she does not have symptoms or diagnosis of sleep apnea. she does not have a history of rheumatic fever. she does not have a history of alcohol use. The patient does not have a history of early familial atrial fibrillation or other arrhythmias.  she has a BMI of Body mass index is 26.89 kg/m.Marland Kitchen Filed Weights   05/20/18 1017  Weight: 66.7 kg    Family History  Problem Relation Age of Onset  . Heart attack Father   . Stroke Mother   . Cancer Sister        breast     Atrial Fibrillation Management history:  Previous antiarrhythmic drugs: sotalol Previous cardioversions: none Previous ablations: none CHADS2VASC score: 6 Anticoagulation history: Eliquis   Past Medical History:  Diagnosis Date  . Anginal pain ([place])   . Arthritis   . Complication of anesthesia   . Coronary artery disease   . Dyspnea    exertion  . Hypertension   . Hypothyroidism   . PAF (paroxysmal atrial fibrillation) ([place])    on Xarelto for maybe a year, opted to stop  . PONV (postoperative nausea and vomiting)   . Renal infarction ([place])   . Small bowel  obstruction ([place]) 06/2015  . Stroke [facility])    oct 2016  . TIA (transient ischemic attack) 03/24/2015  . Vertigo, benign positional    to be evaluated, intermittent   Past Surgical History:  Procedure Laterality Date  . ABDOMINAL HYSTERECTOMY    . CARDIAC CATHETERIZATION N/A 12/24/2014   Procedure: Left Heart Cath and Coronary Angiography;  Surgeon: Sherren Mocha, MD;  Location: [facility];  Service: Cardiovascular;  Laterality: N/A;  . CATARACT EXTRACTION Bilateral   . COLONOSCOPY WITH PROPOFOL N/A 12/01/2013   Procedure: COLONOSCOPY WITH PROPOFOL;  Surgeon: Garlan Fair, MD;  [facility];  Service: Endoscopy;  Laterality: N/A;  . INCISIONAL HERNIA REPAIR N/A 03/10/2016   Procedure: INCISIONAL HERNIA REPAIR TIMES TWO;  Surgeon: Armandina Gemma, MD;  Location: [facility];  Service: General;  Laterality: N/A;  . INGUINAL HERNIA REPAIR Bilateral 03/10/2016   Procedure: BILATERAL INGUINAL HERNIA REPAIRS;  Surgeon: Armandina Gemma, MD;  Location: [facility];  Service: General;  Laterality: Bilateral;  . INSERTION OF MESH N/A 03/10/2016   Procedure: INSERTION OF MESH TO BILATERAL GROINS AND ABDOMEN;  Surgeon: Armandina Gemma, MD;  Location: [facility];  Service: General;  Laterality: N/A;  . laparotomy  06/2015  . LEFT HEART CATH AND CORONARY ANGIOGRAPHY N/A 09/27/2016   Procedure: Left Heart Cath and Coronary Angiography;  Surgeon: Sherren Mocha, MD;  Location: [facility];  Service: Cardiovascular;  Laterality: N/A;  . TONSILLECTOMY      Current Outpatient Medications  Medication Sig Dispense  Refill  . acetaminophen (TYLENOL) 500 MG tablet Take 500 mg by mouth every 6 (six) hours as needed for mild pain.     Marland Kitchen apixaban (ELIQUIS) 5 MG TABS tablet Take 1 tablet (5 mg total) by mouth 2 (two) times daily. 60 tablet 0  . cholecalciferol (VITAMIN D) 1000 UNITS tablet Take 2,000 Units by mouth daily.     Marland Kitchen diltiazem (CARDIZEM CD) 120 MG 24 hr capsule Take 1 capsule (120 mg total) by mouth daily. 30  capsule 0  . levothyroxine (SYNTHROID, LEVOTHROID) 75 MCG tablet Take 75 mcg by mouth daily before breakfast.    . metoprolol tartrate (LOPRESSOR) 25 MG tablet Take 1 tablet (25 mg total) by mouth 2 (two) times daily. 180 tablet 3  . Polyethyl Glycol-Propyl Glycol (SYSTANE ULTRA) 0.4-0.3 % SOLN Place 1-2 drops into both eyes 3 (three) times daily as needed (dry eyes).    . rosuvastatin (CRESTOR) 10 MG tablet Take 1 tablet (10 mg total) by mouth daily. 90 tablet 3  . NITROSTAT 0.4 MG SL tablet Place 1 tablet (0.4 mg total) under the tongue every 5 (five) minutes as needed. Chest pain (Patient not taking: Reported on 05/20/2018) 25 tablet 6   No current facility-administered medications for this encounter.     Allergies  Allergen Reactions  . Anesthesia S-I-60 Nausea And Vomiting    "with hysterectomy years back had post op N/V"    Social History   Socioeconomic History  . Marital status: Divorced    Spouse name: Not on file  . Number of children: 2  . Years of education: HS  . Highest education level: Not on file  Occupational History  . Occupation: retired  Scientific laboratory technician  . Financial resource strain: Not on file  . Food insecurity:    Worry: Not on file    Inability: Not on file  . Transportation needs:    Medical: Not on file    Non-medical: Not on file  Tobacco Use  . Smoking status: Never Smoker  . Smokeless tobacco: Never Used  Substance and Sexual Activity  . Alcohol use: No    Comment: occ.  . Drug use: No  . Sexual activity: Not on file  Lifestyle  . Physical activity:    Days per week: Not on file    Minutes per session: Not on file  . Stress: Not on file  Relationships  . Social connections:    Talks on phone: Not on file    Gets together: Not on file    Attends religious service: Not on file    Active member of club or organization: Not on file    Attends meetings of clubs or organizations: Not on file    Relationship status: Not on file  . Intimate  partner violence:    Fear of current or ex partner: Not on file    Emotionally abused: Not on file    Physically abused: Not on file    Forced sexual activity: Not on file  Other Topics Concern  . Not on file  Social History Narrative   Patient drinks 3-4 cups of caffeine daily.   Patient is right handed.      ROS- All systems are reviewed and negative except as per the HPI above.  Physical Exam: Vitals:   05/20/18 1017  BP: 126/70  Pulse: (!) 109  Weight: 66.7 kg  Height: 5\' 2"  (1.575 m)    GEN- The patient is well appearing, elderly female, alert  and oriented x 3 today.   Head- normocephalic, atraumatic Eyes-  Sclera clear, conjunctiva pink Ears- hearing intact Oropharynx- clear Neck- supple  Lungs- Clear to ausculation bilaterally, normal work of breathing Heart- irregular rate and rhythm, no murmurs, rubs or gallops  GI- soft, NT, ND, + BS Extremities- no clubbing, cyanosis, or edema MS- no significant deformity or atrophy Skin- no rash or lesion Psych- euthymic mood, full affect Neuro- strength and sensation are intact  Wt Readings from Last 3 Encounters:  05/20/18 66.7 kg  05/13/18 65.3 kg  05/03/18 66.9 kg    EKG today demonstrates afib HR 109, QRS 72, QTc 441  Echo 04/21/18 demonstrated  1. The left ventricle has normal systolic function, with an ejection fraction of 55-60%. The cavity size was normal. Mild basal septal hypertrophy. Left ventricular diastology could not be evaluated secondary to atrial fibrillation.  2. The right ventricle has normal systolic function. The cavity was normal. There is no increase in right ventricular wall thickness.  3. Left atrial size was moderately dilated.  4. The mitral valve is normal in structure.  5. The tricuspid valve is normal in structure. Tricuspid valve regurgitation is mild-moderate.  6. The aortic valve is tricuspid Mild sclerosis of the aortic valve.  7. The pulmonic valve was normal in structure. Pulmonic  valve regurgitation is mild by color flow Doppler.  8. Right atrial pressure is estimated at 3 mmHg.  Epic records are reviewed at length today  Assessment and Plan:  1. Persistent atrial fibrillation/atrial flutter The patient has persistent atrial fibrillation. Has failed sotalol. Patient wants to pursue dofetilide, aware of risks and benefits. Aware of price of dofetilide and reports she can afford it. Patient will continue on anticoagulation, states no missed doses. General precautions given. No benadryl use PharmD has screened drugs and no QT prolonging drugs on board QTc in SR 428 ms, Labs today show creatinine at 0.67, K+ 4.5 and mag 2.0, CrCl calculated at 66 mL/min   This patients CHA2DS2-VASc Score and unadjusted Ischemic Stroke Rate (% per year) is equal to 9.7 % stroke rate/year from a score of 6  Above score calculated as 1 point each if present [CHF, HTN, DM, Vascular=MI/PAD/Aortic Plaque, Age if 65-74, or Female] Above score calculated as 2 points each if present [Age > 75, or Stroke/TIA/TE]   2. CAD S/p PCI to LAD 8144 complicated by CVA. No anginal symptoms.  3. HTN Stable, no changes today.  To be admitted later today when bed is available.  [facility] [address] [place], [place] [address] [phone] 05/20/2018 10:39 AM

## 2018-05-21 LAB — BASIC METABOLIC PANEL
Anion gap: 5 (ref 5–15)
BUN: 20 mg/dL (ref 8–23)
CO2: 22 mmol/L (ref 22–32)
Calcium: 8.5 mg/dL — ABNORMAL LOW (ref 8.9–10.3)
Chloride: 108 mmol/L (ref 98–111)
Creatinine, Ser: 0.72 mg/dL (ref 0.44–1.00)
GFR calc Af Amer: >60 mL/min (ref >60)
Glucose, Bld: 114 mg/dL — ABNORMAL HIGH (ref 70–99)
Potassium: 4.3 mmol/L (ref 3.5–5.1)
SODIUM: 135 mmol/L (ref 135–145)

## 2018-05-21 LAB — MAGNESIUM: Magnesium: 2.1 mg/dL (ref 1.7–2.4)

## 2018-05-21 NOTE — Plan of Care (Signed)
  Problem: Clinical Measurements: Goal: Will remain free from infection Outcome: Progressing Note:  No s/s of infection noted. Goal: Respiratory complications will improve Outcome: Progressing Note:  No s/s of respiratory complications noted.  Stable on room air. Goal: Cardiovascular complication will be avoided Outcome: Progressing Note:  NSR on monitor.  No cardiovascular complications noted.

## 2018-05-21 NOTE — Progress Notes (Signed)
Pharmacy Review for Dofetilide (Tikosyn) Initiation  Admit Complaint: 85 y.o. female admitted 05/20/2018 with atrial fibrillation to be initiated on dofetilide.   Assessment:  Patient Exclusion Criteria: If any screening criteria checked as "Yes", then  patient  should NOT receive dofetilide until criteria item is corrected. If "Yes" please indicate correction plan.  YES  NO Patient  Exclusion Criteria Correction Plan  []  [x]  Baseline QTc interval is greater than or equal to 440 msec. IF above YES box checked dofetilide contraindicated unless patient has ICD; then may proceed if QTc 500-550 msec or with known ventricular conduction abnormalities may proceed with QTc 550-600 msec. QTc = 0.42 428 ms yesterday on admit  []  [x]  Magnesium level is less than 1.8 mEq/l : Last magnesium:  Lab Results  Component Value Date   MG 2.1 05/21/2018        Mg 2 yesterday  []  [x]  Potassium level is less than 4 mEq/l : Last potassium:  Lab Results  Component Value Date   K 4.3 05/21/2018        K 4.5 yesterday  []  [x]  Patient is known or suspected to have a digoxin level greater than 2 ng/ml: No results found for: DIGOXIN    []  [x]  Creatinine clearance less than 20 ml/min (calculated using Cockcroft-Gault, actual body weight and serum creatinine): Estimated Creatinine Clearance: 47.5 mL/min (by C-G formula based on SCr of 0.72 mg/dL).    []  [x]  Patient has received drugs known to prolong the QT intervals within the last 48 hours (phenothiazines, tricyclics or tetracyclic antidepressants, erythromycin, H-1 antihistamines, cisapride, fluoroquinolones, azithromycin). Drugs not listed above may have an, as yet, undetected potential to prolong the QT interval, updated information on QT prolonging agents is available at this website:QT prolonging agents   []  [x]  Patient received a dose of hydrochlorothiazide ([place]) alone or in any combination including triamterene (Dyazide, Maxzide) in the last 48 hours.    []  [x]  Patient received a medication known to increase dofetilide plasma concentrations prior to initial dofetilide dose:  . Trimethoprim (Primsol, Proloprim) in the last 36 hours . Verapamil (Calan, Verelan) in the last 36 hours or a sustained release dose in the last 72 hours . Megestrol (Megace) in the last 5 days  . Cimetidine (Tagamet) in the last 6 hours . Ketoconazole (Nizoral) in the last 24 hours . Itraconazole (Sporanox) in the last 48 hours  . Prochlorperazine (Compazine) in the last 36 hours    []  [x]  Patient is known to have a history of torsades de pointes; congenital or acquired long QT syndromes.   []  [x]  Patient has received a Class 1 antiarrhythmic with less than 2 half-lives since last dose. (Disopyramide, Quinidine, Procainamide, Lidocaine, Mexiletine, Flecainide, Propafenone)   []  [x]  Patient has received amiodarone therapy in the past 3 months or amiodarone level is greater than 0.3 ng/ml.    Patient has been appropriately anticoagulated with Apixaban.  Ordering provider was confirmed at [email or web address] if they are not listed on the [place] Prescribers list.  Goal of Therapy: Follow renal function, electrolytes, potential drug interactions, and dose adjustment. Provide education and 1 week supply at discharge.  Plan:  [x]   Physician selected initial dose within range recommended for patients level of renal function - will monitor for response.  []   Physician selected initial dose outside of range recommended for patients level of renal function - will discuss if the dose should be altered at this time.   Select One Calculated CrCl  Dose  q12h  [x]  > 60 ml/min 500 mcg  []  40-60 ml/min 250 mcg  []  20-40 ml/min 125 mcg   2. Follow up QTc after the first 5 doses, renal function, electrolytes (K & Mg) daily x 3     days, dose adjustment, success of initiation and facilitate 1 week discharge supply as     clinically indicated.  3. Initiate Tikosyn  education video (Call [phone] and ask for Tikosyn Video # 116).  4. Place Enrollment Form on the chart for discharge supply of dofetilide.   Antonietta Jewel, PharmD, [place] Clinical Pharmacist  Pager: [phone] Phone: [phone]  9:47 AM 05/21/2018

## 2018-05-21 NOTE — Progress Notes (Signed)
Pharmacy Consult for [place] Electrolyte Replacement  Pharmacy consulted to assist in monitoring and replacing electrolytes in this 85 y.o. female admitted on 05/20/2018 undergoing dofetilide initiation. First dofetilide dose: 3/16@2004 .  Labs:    Component Value Date/Time   K 4.3 05/21/2018 0332   MG 2.1 05/21/2018 0332     Plan: Potassium: K >/= 4: No additional supplementation needed  Magnesium: Mg >2: No additional supplementation needed  Thank you for allowing pharmacy to participate in this patient's care   Antonietta Jewel, PharmD, [place] Clinical Pharmacist  Pager: [phone] Phone: [phone] 05/21/2018  10:04 AM

## 2018-05-21 NOTE — Progress Notes (Signed)
Patient converted to NSR after first dose of tikosyn.  VSS.  Will continue to monitor.  Rachel Park

## 2018-05-21 NOTE — Progress Notes (Addendum)
Progress Note  Patient Name: Rachel Park Date of Encounter: 05/21/2018  Primary Cardiologist: Mertie Moores, MD   Subjective   Was hoping her breathing would feel better in SR, no CP, no palpitations, tolerating drug  Inpatient Medications    Scheduled Meds: . apixaban  5 mg Oral BID  . cholecalciferol  2,000 Units Oral Daily  . diltiazem  120 mg Oral Daily  . dofetilide  500 mcg Oral BID  . levothyroxine  75 mcg Oral QAC breakfast  . metoprolol tartrate  25 mg Oral BID  . rosuvastatin  10 mg Oral Daily  . sodium chloride flush  3 mL Intravenous Q12H   Continuous Infusions: . sodium chloride     PRN Meds: sodium chloride, acetaminophen, nitroGLYCERIN, polyvinyl alcohol, sodium chloride flush   Vital Signs    Vitals:   05/20/18 2004 05/20/18 2132 05/21/18 0408 05/21/18 0808  BP: 118/87  109/74 133/84  Pulse: (!) 120 (!) 113 (!) 59   Resp:   16   Temp:  98.4 F (36.9 C) 98.4 F (36.9 C)   TempSrc:  Oral Oral   SpO2:  98% 94%   Weight:      Height:        Intake/Output Summary (Last 24 hours) at 05/21/2018 0847 Last data filed at 05/20/2018 1813 Gross per 24 hour  Intake 360 ml  Output -  Net 360 ml   Last 3 Weights 05/20/2018 05/20/2018 05/13/2018  Weight (lbs) 147 lb 0.8 oz 147 lb 144 lb  Weight (kg) 66.7 kg 66.679 kg 65.318 kg      Telemetry    AFib w/RVR >> SR/SB 50's-60's, one couplet - Personally Reviewed  ECG    SB, QT stable - Personally Reviewed  Physical Exam   GEN: No acute distress.   Neck: No JVD Cardiac: RRR, no murmurs, rubs, or gallops.  Respiratory: CTA b/l. GI: Soft, nontender, non-distended  MS: No edema; No deformity. Neuro:  Nonfocal  Psych: Normal affect   Labs    Chemistry Recent Labs  Lab 05/20/18 1034 05/21/18 0332  NA 135 135  K 4.5 4.3  CL 104 108  CO2 25 22  GLUCOSE 105* 114*  BUN 16 20  CREATININE 0.67 0.72  CALCIUM 9.0 8.5*  GFRNONAA >60 >60  GFRAA >60 >60  ANIONGAP 6 5     HematologyNo results  for input(s): WBC, RBC, HGB, HCT, MCV, MCH, MCHC, RDW, PLT in the last 168 hours.  Cardiac EnzymesNo results for input(s): TROPONINI in the last 168 hours. No results for input(s): TROPIPOC in the last 168 hours.   BNPNo results for input(s): BNP, PROBNP in the last 168 hours.   DDimer No results for input(s): DDIMER in the last 168 hours.   Radiology    No results found.  Cardiac Studies   04/21/2018: TTE IMPRESSIONS  1. The left ventricle has normal systolic function, with an ejection fraction of 55-60%. The cavity size was normal. Mild basal septal hypertrophy. Left ventricular diastology could not be evaluated secondary to atrial fibrillation.  2. The right ventricle has normal systolic function. The cavity was normal. There is no increase in right ventricular wall thickness.  3. Left atrial size was moderately dilated.  4. The mitral valve is normal in structure.  5. The tricuspid valve is normal in structure. Tricuspid valve regurgitation is mild-moderate.  6. The aortic valve is tricuspid Mild sclerosis of the aortic valve.  7. The pulmonic valve was normal in structure.  Pulmonic valve regurgitation is mild by color flow Doppler.  8. Right atrial pressure is estimated at 3 mmHg.  FINDINGS  Left Ventricle: The left ventricle has normal systolic function, with an ejection fraction of 55-60%. The cavity size was normal. Mild basal septal hypertrophy. Left ventricular diastology could not be evaluated secondary to atrial fibrillation. Right Ventricle: The right ventricle has normal systolic function. The cavity was normal. There is no increase in right ventricular wall thickness. Left Atrium: left atrial size was moderately dilated Right Atrium: right atrial size was normal in size Right atrial pressure is estimated at 3 mmHg. Interatrial Septum: No atrial level shunt detected by color flow Doppler. Pericardium: There is no evidence of pericardial effusion. Mitral Valve: The mitral  valve is normal in structure. Mitral valve regurgitation is mild by color flow Doppler. Tricuspid Valve: The tricuspid valve is normal in structure. Tricuspid valve regurgitation is mild-moderate by color flow Doppler. Aortic Valve: The aortic valve is tricuspid Mild sclerosis of the aortic valve Aortic valve regurgitation was not visualized by color flow Doppler. Pulmonic Valve: The pulmonic valve was normal in structure. Pulmonic valve regurgitation is mild by color flow Doppler. Venous: The inferior vena cava is normal in size with greater than 50% respiratory variability.  Patient Profile     85 y.o. female with a history of CAD s/p PCI to LAD, CVA, HTN, hypothyroidism, andpersistent atrial fibrillation admitted for Tikosyn intiation  Assessment & Plan    1. Persistent AFib     CHA2DS2Vasc is 7, on Eliquis appropriately dosed     K+ 4.3     Mag 2.1     Creat 0.72, stable     QT is OK     SR after 1st dose, monitor QT   2. CAD     No anginal symptoms     Continue home meds  3. HTN     No changes  4. Hypothyroidism     Continue home regime       For questions or updates, please contact [place] Please consult [email or web address] for contact info under        Signed, Baldwin Jamaica, PA-C  05/21/2018, 8:47 AM    I have seen and examined this patient with Tommye Standard.  Agree with above, note added to reflect my findings.  On exam, RRR, no murmurs, lungs clear.  Admitted for dofetilide loading.  She is converted to sinus rhythm after her initial dose.  We Aavya Shafer continue to monitor her QTC.  Creatinine remains stable.  Danie Diehl M. Jameela Michna MD 05/21/2018 10:21 AM

## 2018-05-22 ENCOUNTER — Encounter (HOSPITAL_COMMUNITY): Payer: Self-pay | Admitting: *Deleted

## 2018-05-22 ENCOUNTER — Encounter (HOSPITAL_COMMUNITY)
Admission: RE | Disposition: A | Discharge status: Home / Self Care | Payer: Self-pay | Source: Ambulatory Visit | Attending: Internal Medicine

## 2018-05-22 ENCOUNTER — Inpatient Hospital Stay (HOSPITAL_COMMUNITY): Payer: Medicare HMO | Admitting: Certified Registered Nurse Anesthetist

## 2018-05-22 DIAGNOSIS — I4891 Unspecified atrial fibrillation: Secondary | ICD-10-CM

## 2018-05-22 HISTORY — PX: CARDIOVERSION: SHX1299

## 2018-05-22 LAB — BASIC METABOLIC PANEL
Anion gap: 5 (ref 5–15)
BUN: 29 mg/dL — ABNORMAL HIGH (ref 8–23)
CO2: 23 mmol/L (ref 22–32)
Calcium: 8.8 mg/dL — ABNORMAL LOW (ref 8.9–10.3)
Chloride: 110 mmol/L (ref 98–111)
Creatinine, Ser: 0.65 mg/dL (ref 0.44–1.00)
GFR calc Af Amer: >60 mL/min (ref >60)
GFR calc non Af Amer: >60 mL/min (ref >60)
Glucose, Bld: 101 mg/dL — ABNORMAL HIGH (ref 70–99)
Potassium: 4 mmol/L (ref 3.5–5.1)
Sodium: 138 mmol/L (ref 135–145)

## 2018-05-22 LAB — MAGNESIUM: MAGNESIUM: 2 mg/dL (ref 1.7–2.4)

## 2018-05-22 SURGERY — CARDIOVERSION
Anesthesia: General

## 2018-05-22 MED ORDER — LIDOCAINE HCL (CARDIAC) PF 100 MG/5ML IV SOSY
PREFILLED_SYRINGE | INTRAVENOUS | Status: DC | PRN
  Administered 2018-05-22: 60 mg via INTRAVENOUS

## 2018-05-22 MED ORDER — SODIUM CHLORIDE 0.9 % IV SOLN: 250.0000 mL | INTRAVENOUS | Status: DC

## 2018-05-22 MED ORDER — SODIUM CHLORIDE 0.9% FLUSH
3.0000 mL | INTRAVENOUS | Status: DC | PRN
  Administered 2018-05-23: 3 mL via INTRAVENOUS
  Filled 2018-05-22: qty 3

## 2018-05-22 MED ORDER — MAGNESIUM SULFATE 2 GM/50ML IV SOLN
2.0000 g | Freq: Once | INTRAVENOUS | Status: AC
  Administered 2018-05-22: 2 g via INTRAVENOUS
  Filled 2018-05-22: qty 50

## 2018-05-22 MED ORDER — SODIUM CHLORIDE 0.9% FLUSH
3.0000 mL | Freq: Two times a day (BID) | INTRAVENOUS | Status: DC
  Administered 2018-05-22: 3 mL via INTRAVENOUS

## 2018-05-22 MED ORDER — HYDROCORTISONE 1 % EX CREA
1.0000 "application " | TOPICAL_CREAM | Freq: Three times a day (TID) | CUTANEOUS | Status: DC | PRN
  Filled 2018-05-22: qty 28

## 2018-05-22 MED ORDER — PROPOFOL 10 MG/ML IV BOLUS
INTRAVENOUS | Status: DC | PRN
  Administered 2018-05-22: 50 mg via INTRAVENOUS

## 2018-05-22 NOTE — Interval H&P Note (Signed)
History and Physical Interval Note:  05/22/2018 10:41 AM  Rachel Park  has presented today for surgery, with the diagnosis of afib.  The various methods of treatment have been discussed with the patient and family. After consideration of risks, benefits and other options for treatment, the patient has consented to  Procedure(s): CARDIOVERSION (N/A) as a surgical intervention.  The patient's history has been reviewed, patient examined, no change in status, stable for surgery.  I have reviewed the patient's chart and labs.  Questions were answered to the patient's satisfaction.     Jenkins Rouge

## 2018-05-22 NOTE — TOC Initial Note (Signed)
Transition of Care [facility]) - Initial/Assessment Note    Patient Details  Name: Rachel Park MRN: [id number] Date of Birth: 03-19-1931  [facility] CM/SW Contact:    Bethena Roys, RN Phone Number: 05/22/2018, 3:54 PM  Clinical Narrative;  Pt presented for Tikosyn Load. PTA from home alone. Patient is independent and drives herself to appointments. Pt has PCP and gets her medications without any problems. Pt will need Rx for 7 day supply no refills and then will need Rx for 30 day supply with refills to be e-scribed to [facility]. No further needs from CM at this time.                   Expected Discharge Plan: Home/Self Care Barriers to Discharge: No Barriers Identified   Patient Goals and CMS Choice   CMS Medicare.gov Compare Post Acute Care list provided to:: (N/A) Choice offered to / list presented to : NA  Expected Discharge Plan and Services Expected Discharge Plan: Home/Self Care Discharge Planning Services: CM Consult, Medication Assistance Post Acute Care Choice: NA Living arrangements for the past 2 months: (N/A)                 DME Arranged: N/A DME Agency: NA HH Arranged: NA HH Agency: NA  Prior Living Arrangements/Services Living arrangements for the past 2 months: (N/A) Lives with:: (N/A) Patient language and need for interpreter reviewed:: No Do you feel safe going back to the place where you live?: Yes      Need for Family Participation in Patient Care: No (Comment) Care giver support system in place?: No (comment) Current home services: (N/A) Criminal Activity/Legal Involvement Pertinent to Current Situation/Hospitalization: No - Comment as needed  Activities of Daily Living Home Assistive Devices/Equipment: Wheelchair ADL Screening (condition at time of admission) Patient's cognitive ability adequate to safely complete daily activities?: Yes Is the patient deaf or have difficulty hearing?: No Does the patient have  difficulty seeing, even when wearing glasses/contacts?: No Does the patient have difficulty concentrating, remembering, or making decisions?: No Patient able to express need for assistance with ADLs?: Yes Does the patient have difficulty dressing or bathing?: No Independently performs ADLs?: Yes (appropriate for developmental age) Communication: Independent Dressing (OT): Independent Is this a change from baseline?: Pre-admission baseline Grooming: Independent Is this a change from baseline?: Pre-admission baseline Feeding: Independent Is this a change from baseline?: Pre-admission baseline Bathing: Independent Is this a change from baseline?: Pre-admission baseline Toileting: Independent Is this a change from baseline?: Pre-admission baseline In/Out Bed: Independent Is this a change from baseline?: Pre-admission baseline Walks in Home: Independent Is this a change from baseline?: Pre-admission baseline Does the patient have difficulty walking or climbing stairs?: No Weakness of Legs: None Weakness of Arms/Hands: None  Permission Sought/Granted Permission sought to share information with : Case Manager                Emotional Assessment Appearance:: Appears stated age Attitude/Demeanor/Rapport: Engaged Affect (typically observed): Accepting Orientation: : Oriented to Self, Oriented to Place, Oriented to  Time, Oriented to Situation Alcohol / Substance Use: Never Used Psych Involvement: No (comment)  Admission diagnosis:  Afib for tykosin load Patient Active Problem List   Diagnosis Date Noted  . Atrial fibrillation ([place]) 05/20/2018  . Renal infarct ([place])   . Hyperlipidemia 11/27/2016  . Bilateral inguinal hernia without obstruction or gangrene 03/07/2016  . Incisional hernia, without obstruction or gangrene 03/07/2016  . Persistent atrial fibrillation 06/21/2015  .  TIA (transient ischemic attack) 03/24/2015  . CAD (coronary artery disease) 12/26/2014  . COPD  (chronic obstructive pulmonary disease) ([place]) 12/26/2014  . Hypothyroidism 12/26/2014  . Cerebral embolism with cerebral infarction 12/25/2014   PCP:  Lajean Manes, MD Pharmacy:   [place], [place] - [address] [place] [address] Phone: [phone] Fax: [place], [place] [address] [place] [address] Phone: [phone] Fax: [phone]     Social Determinants of Health ([place]) Interventions    Readmission Risk Interventions 30 Day Unplanned Readmission Risk Score     Admission (Current) from 05/20/2018 in [facility]  30 Day Unplanned Readmission Risk Score (%)  11 Filed at 05/22/2018 1200     This score is the patient's risk of an unplanned readmission within 30 days of being discharged (0 -100%). The score is based on dignosis, age, lab data, medications, orders, and past utilization.   Low:  0-14.9   Medium: 15-21.9   High: 22-29.9   Extreme: 30 and above       No flowsheet data found.

## 2018-05-22 NOTE — Progress Notes (Addendum)
Progress Note  Patient Name: Rachel Park Date of Encounter: 05/22/2018  Primary Cardiologist: Mertie Moores, MD   Subjective   Reports particularly symptoms of orthopnea/PND-like symptoms, this has been attributed to her AF.  Less frequent day symptoms.  NO CP  Inpatient Medications    Scheduled Meds: . apixaban  5 mg Oral BID  . cholecalciferol  2,000 Units Oral Daily  . diltiazem  120 mg Oral Daily  . dofetilide  500 mcg Oral BID  . levothyroxine  75 mcg Oral QAC breakfast  . metoprolol tartrate  25 mg Oral BID  . rosuvastatin  10 mg Oral Daily  . sodium chloride flush  3 mL Intravenous Q12H   Continuous Infusions: . sodium chloride     PRN Meds: sodium chloride, acetaminophen, nitroGLYCERIN, polyvinyl alcohol, sodium chloride flush   Vital Signs    Vitals:   05/21/18 1514 05/21/18 2004 05/21/18 2036 05/22/18 0614  BP: 131/64 124/64  119/89  Pulse: 64 68 67 (!) 103  Resp: 18  18 18   Temp: 97.8 F (36.6 C)  98.4 F (36.9 C) 97.9 F (36.6 C)  TempSrc: Oral  Oral Oral  SpO2: 97%  95% 90%  Weight:    67.5 kg  Height:        Intake/Output Summary (Last 24 hours) at 05/22/2018 0710 Last data filed at 05/22/2018 0600 Gross per 24 hour  Intake 600 ml  Output -  Net 600 ml   Last 3 Weights 05/22/2018 05/20/2018 05/20/2018  Weight (lbs) 148 lb 12.8 oz 147 lb 0.8 oz 147 lb  Weight (kg) 67.495 kg 66.7 kg 66.679 kg      Telemetry    Back in AFib, rates 110's - Personally Reviewed  ECG    SB 51bpm, QT 5106ms, QTc 481 - Personally Reviewed  Physical Exam   GEN: No acute distress.   Neck: No JVD Cardiac: irreg-irreg, no murmurs, rubs, or gallops.  Respiratory: CTA b/l. GI: Soft, nontender, non-distended  MS: No edema; No deformity. Neuro:  Nonfocal  Psych: Normal affect   Labs    Chemistry Recent Labs  Lab 05/20/18 1034 05/21/18 0332 05/22/18 0259  NA 135 135 138  K 4.5 4.3 4.0  CL 104 108 110  CO2 25 22 23   GLUCOSE 105* 114* 101*  BUN 16 20  29*  CREATININE 0.67 0.72 0.65  CALCIUM 9.0 8.5* 8.8*  GFRNONAA >60 >60 >60  GFRAA >60 >60 >60  ANIONGAP 6 5 5      HematologyNo results for input(s): WBC, RBC, HGB, HCT, MCV, MCH, MCHC, RDW, PLT in the last 168 hours.  Cardiac EnzymesNo results for input(s): TROPONINI in the last 168 hours. No results for input(s): TROPIPOC in the last 168 hours.   BNPNo results for input(s): BNP, PROBNP in the last 168 hours.   DDimer No results for input(s): DDIMER in the last 168 hours.   Radiology    No results found.  Cardiac Studies   04/21/2018: TTE IMPRESSIONS  1. The left ventricle has normal systolic function, with an ejection fraction of 55-60%. The cavity size was normal. Mild basal septal hypertrophy. Left ventricular diastology could not be evaluated secondary to atrial fibrillation.  2. The right ventricle has normal systolic function. The cavity was normal. There is no increase in right ventricular wall thickness.  3. Left atrial size was moderately dilated.  4. The mitral valve is normal in structure.  5. The tricuspid valve is normal in structure. Tricuspid valve regurgitation  is mild-moderate.  6. The aortic valve is tricuspid Mild sclerosis of the aortic valve.  7. The pulmonic valve was normal in structure. Pulmonic valve regurgitation is mild by color flow Doppler.  8. Right atrial pressure is estimated at 3 mmHg.  FINDINGS  Left Ventricle: The left ventricle has normal systolic function, with an ejection fraction of 55-60%. The cavity size was normal. Mild basal septal hypertrophy. Left ventricular diastology could not be evaluated secondary to atrial fibrillation. Right Ventricle: The right ventricle has normal systolic function. The cavity was normal. There is no increase in right ventricular wall thickness. Left Atrium: left atrial size was moderately dilated Right Atrium: right atrial size was normal in size Right atrial pressure is estimated at 3 mmHg. Interatrial  Septum: No atrial level shunt detected by color flow Doppler. Pericardium: There is no evidence of pericardial effusion. Mitral Valve: The mitral valve is normal in structure. Mitral valve regurgitation is mild by color flow Doppler. Tricuspid Valve: The tricuspid valve is normal in structure. Tricuspid valve regurgitation is mild-moderate by color flow Doppler. Aortic Valve: The aortic valve is tricuspid Mild sclerosis of the aortic valve Aortic valve regurgitation was not visualized by color flow Doppler. Pulmonic Valve: The pulmonic valve was normal in structure. Pulmonic valve regurgitation is mild by color flow Doppler. Venous: The inferior vena cava is normal in size with greater than 50% respiratory variability.   09/27/16: LHC 1. Single-vessel coronary artery disease with patency of the proximal LAD stent and mild nonobstructive stenosis in the mid vessel 2. Widely patent left main, left circumflex, and RCA with no significant obstructive lesions. 3. Normal LV function with estimated LVEF 55-65%  Patient Profile     85 y.o. female with a history of CAD s/p PCI to LAD, CVA, HTN, hypothyroidism, andpersistent atrial fibrillation admitted for Tikosyn intiation  Assessment & Plan    1. Persistent AFib     CHA2DS2Vasc is 7, on Eliquis appropriately dosed     K+ 4.0     Mag 2.0     Creat 0.65, stable     QT is longer, though QTc remains <583ms She is back in AFib , looks in/out yesterday I am note certain if DCCV is reasonable today vs continue load given AF on/off yesterday BP could tolerate more lopressor though her sinus rates are in the 50's-60's so hesitate to push BB  2. CAD     No anginal symptoms     Continue home meds  3. HTN     No changes  4. Hypothyroidism     Continue home regime       For questions or updates, please contact [place] Please consult [email or web address] for contact info under        Signed, Baldwin [place], PA-C  05/22/2018, 7:10 AM      I have seen and examined this patient with Tommye Standard.  Agree with above, note added to reflect my findings.  On exam, irregular rhythm, no murmurs, lungs clear.  Unfortunately, the patient went back into atrial fibrillation yesterday.  She continued to have episodes of sinus rhythm and atrial fibrillation, though since 1:00 last night has been in atrial fibrillation.  We Shahla Betsill plan for cardioversion today should she remain in atrial fibrillation despite her morning dose.  Zohaib Heeney M. Malahki Gasaway MD 05/22/2018 8:30 AM

## 2018-05-22 NOTE — Progress Notes (Signed)
Pharmacy Consult for [place] Electrolyte Replacement  Pharmacy consulted to assist in monitoring and replacing electrolytes in this 85 y.o. female admitted on 05/20/2018 undergoing dofetilide initiation. First dofetilide dose:3/16@2004   Labs:    Component Value Date/Time   K 4.0 05/22/2018 0259   MG 2.0 05/22/2018 0259     Plan: Potassium: K >/= 4: No additional supplementation needed  Magnesium: Mg 1.8-2: Mg 2 gm IV x1 to prevent Mg from dropping below 1.8 - do not need to recheck Mg  Thank you for allowing pharmacy to participate in this patient's care   Claiborne Billings, PharmD PGY2 Cardiology Pharmacy Resident Please check AMION for all Pharmacist numbers by unit 05/22/2018 7:32 AM

## 2018-05-22 NOTE — H&P (View-Only) (Signed)
Progress Note  Patient Name: Rachel Park Date of Encounter: 05/22/2018  Primary Cardiologist: Mertie Moores, MD   Subjective   Reports particularly symptoms of orthopnea/PND-like symptoms, this has been attributed to her AF.  Less frequent day symptoms.  NO CP  Inpatient Medications    Scheduled Meds: . apixaban  5 mg Oral BID  . cholecalciferol  2,000 Units Oral Daily  . diltiazem  120 mg Oral Daily  . dofetilide  500 mcg Oral BID  . levothyroxine  75 mcg Oral QAC breakfast  . metoprolol tartrate  25 mg Oral BID  . rosuvastatin  10 mg Oral Daily  . sodium chloride flush  3 mL Intravenous Q12H   Continuous Infusions: . sodium chloride     PRN Meds: sodium chloride, acetaminophen, nitroGLYCERIN, polyvinyl alcohol, sodium chloride flush   Vital Signs    Vitals:   05/21/18 1514 05/21/18 2004 05/21/18 2036 05/22/18 0614  BP: 131/64 124/64  119/89  Pulse: 64 68 67 (!) 103  Resp: 18  18 18   Temp: 97.8 F (36.6 C)  98.4 F (36.9 C) 97.9 F (36.6 C)  TempSrc: Oral  Oral Oral  SpO2: 97%  95% 90%  Weight:    67.5 kg  Height:        Intake/Output Summary (Last 24 hours) at 05/22/2018 0710 Last data filed at 05/22/2018 0600 Gross per 24 hour  Intake 600 ml  Output -  Net 600 ml   Last 3 Weights 05/22/2018 05/20/2018 05/20/2018  Weight (lbs) 148 lb 12.8 oz 147 lb 0.8 oz 147 lb  Weight (kg) 67.495 kg 66.7 kg 66.679 kg      Telemetry    Back in AFib, rates 110's - Personally Reviewed  ECG    SB 51bpm, QT 542ms, QTc 481 - Personally Reviewed  Physical Exam   GEN: No acute distress.   Neck: No JVD Cardiac: irreg-irreg, no murmurs, rubs, or gallops.  Respiratory: CTA b/l. GI: Soft, nontender, non-distended  MS: No edema; No deformity. Neuro:  Nonfocal  Psych: Normal affect   Labs    Chemistry Recent Labs  Lab 05/20/18 1034 05/21/18 0332 05/22/18 0259  NA 135 135 138  K 4.5 4.3 4.0  CL 104 108 110  CO2 25 22 23   GLUCOSE 105* 114* 101*  BUN 16 20  29*  CREATININE 0.67 0.72 0.65  CALCIUM 9.0 8.5* 8.8*  GFRNONAA >60 >60 >60  GFRAA >60 >60 >60  ANIONGAP 6 5 5      HematologyNo results for input(s): WBC, RBC, HGB, HCT, MCV, MCH, MCHC, RDW, PLT in the last 168 hours.  Cardiac EnzymesNo results for input(s): TROPONINI in the last 168 hours. No results for input(s): TROPIPOC in the last 168 hours.   BNPNo results for input(s): BNP, PROBNP in the last 168 hours.   DDimer No results for input(s): DDIMER in the last 168 hours.   Radiology    No results found.  Cardiac Studies   04/21/2018: TTE IMPRESSIONS  1. The left ventricle has normal systolic function, with an ejection fraction of 55-60%. The cavity size was normal. Mild basal septal hypertrophy. Left ventricular diastology could not be evaluated secondary to atrial fibrillation.  2. The right ventricle has normal systolic function. The cavity was normal. There is no increase in right ventricular wall thickness.  3. Left atrial size was moderately dilated.  4. The mitral valve is normal in structure.  5. The tricuspid valve is normal in structure. Tricuspid valve regurgitation  is mild-moderate.  6. The aortic valve is tricuspid Mild sclerosis of the aortic valve.  7. The pulmonic valve was normal in structure. Pulmonic valve regurgitation is mild by color flow Doppler.  8. Right atrial pressure is estimated at 3 mmHg.  FINDINGS  Left Ventricle: The left ventricle has normal systolic function, with an ejection fraction of 55-60%. The cavity size was normal. Mild basal septal hypertrophy. Left ventricular diastology could not be evaluated secondary to atrial fibrillation. Right Ventricle: The right ventricle has normal systolic function. The cavity was normal. There is no increase in right ventricular wall thickness. Left Atrium: left atrial size was moderately dilated Right Atrium: right atrial size was normal in size Right atrial pressure is estimated at 3 mmHg. Interatrial  Septum: No atrial level shunt detected by color flow Doppler. Pericardium: There is no evidence of pericardial effusion. Mitral Valve: The mitral valve is normal in structure. Mitral valve regurgitation is mild by color flow Doppler. Tricuspid Valve: The tricuspid valve is normal in structure. Tricuspid valve regurgitation is mild-moderate by color flow Doppler. Aortic Valve: The aortic valve is tricuspid Mild sclerosis of the aortic valve Aortic valve regurgitation was not visualized by color flow Doppler. Pulmonic Valve: The pulmonic valve was normal in structure. Pulmonic valve regurgitation is mild by color flow Doppler. Venous: The inferior vena cava is normal in size with greater than 50% respiratory variability.   09/27/16: LHC 1. Single-vessel coronary artery disease with patency of the proximal LAD stent and mild nonobstructive stenosis in the mid vessel 2. Widely patent left main, left circumflex, and RCA with no significant obstructive lesions. 3. Normal LV function with estimated LVEF 55-65%  Patient Profile     85 y.o. female with a history of CAD s/p PCI to LAD, CVA, HTN, hypothyroidism, andpersistent atrial fibrillation admitted for Tikosyn intiation  Assessment & Plan    1. Persistent AFib     CHA2DS2Vasc is 7, on Eliquis appropriately dosed     K+ 4.0     Mag 2.0     Creat 0.65, stable     QT is longer, though QTc remains <517ms She is back in AFib , looks in/out yesterday I am note certain if DCCV is reasonable today vs continue load given AF on/off yesterday BP could tolerate more lopressor though her sinus rates are in the 50's-60's so hesitate to push BB  2. CAD     No anginal symptoms     Continue home meds  3. HTN     No changes  4. Hypothyroidism     Continue home regime       For questions or updates, please contact [facility] Please consult [email or web address] for contact info under        Signed, Baldwin Jamaica, PA-C  05/22/2018, 7:10 AM      I have seen and examined this patient with Tommye Standard.  Agree with above, note added to reflect my findings.  On exam, irregular rhythm, no murmurs, lungs clear.  Unfortunately, the patient went back into atrial fibrillation yesterday.  She continued to have episodes of sinus rhythm and atrial fibrillation, though since 1:00 last night has been in atrial fibrillation.  We Juventino Pavone plan for cardioversion today should she remain in atrial fibrillation despite her morning dose.  Zolton Dowson M. Milynn Quirion MD 05/22/2018 8:30 AM

## 2018-05-22 NOTE — Anesthesia Postprocedure Evaluation (Signed)
Anesthesia Post Note  Patient: Rachel Park  Procedure(s) Performed: CARDIOVERSION (N/A )     Patient location during evaluation: Endoscopy Anesthesia Type: General Level of consciousness: awake and alert Pain management: pain level controlled Vital Signs Assessment: post-procedure vital signs reviewed and stable Respiratory status: spontaneous breathing, nonlabored ventilation and respiratory function stable Cardiovascular status: blood pressure returned to baseline and stable Postop Assessment: no apparent nausea or vomiting Anesthetic complications: no    Last Vitals:  Vitals:   05/22/18 1116 05/22/18 1125  BP: (!) 124/95 (!) 119/98  Pulse: 95 96  Resp: 15 (!) 30  Temp: (!) 36.4 C   SpO2: 94% 93%    Last Pain:  Vitals:   05/22/18 1125  TempSrc:   PainSc: 0-No pain                 Evelyn Moch,W. EDMOND

## 2018-05-22 NOTE — Anesthesia Preprocedure Evaluation (Addendum)
Anesthesia Evaluation  Patient identified by MRN, date of birth, ID band Patient awake    Reviewed: Allergy & Precautions, H&P , NPO status , Patient's Chart, lab work & pertinent test results  History of Anesthesia Complications (+) PONV  Airway Mallampati: II  TM Distance: >3 FB Neck ROM: Full    Dental no notable dental hx. (+) Teeth Intact, Dental Advisory Given   Pulmonary COPD,    Pulmonary exam normal breath sounds clear to auscultation       Cardiovascular hypertension, Pt. on medications and Pt. on home beta blockers + CAD   Rhythm:Irregular Rate:Tachycardia     Neuro/Psych TIACVA negative psych ROS   GI/Hepatic negative GI ROS, Neg liver ROS,   Endo/Other  Hypothyroidism   Renal/GU negative Renal ROS  negative genitourinary   Musculoskeletal  (+) Arthritis ,   Abdominal   Peds  Hematology negative hematology ROS (+)   Anesthesia Other Findings   Reproductive/Obstetrics negative OB ROS                            Anesthesia Physical Anesthesia Plan  ASA: III  Anesthesia Plan: General   Post-op Pain Management:    Induction: Intravenous  PONV Risk Score and Plan: 4 or greater and Treatment may vary due to age or medical condition  Airway Management Planned: Mask  Additional Equipment:   Intra-op Plan:   Post-operative Plan:   Informed Consent: I have reviewed the patients History and Physical, chart, labs and discussed the procedure including the risks, benefits and alternatives for the proposed anesthesia with the patient or authorized representative who has indicated his/her understanding and acceptance.     Dental advisory given  Plan Discussed with: CRNA  Anesthesia Plan Comments:         Anesthesia Quick Evaluation

## 2018-05-22 NOTE — CV Procedure (Signed)
DCC: Anesthesia: Propofol Dr Therisa Doyne  [facility] x 3 120/150/200J biphasic  Each time converted but quickly reverted back to fib/flutter  Will need to continue with Tikosyn load Discussed with Dr Roselie Skinner

## 2018-05-22 NOTE — Plan of Care (Signed)
  Problem: Activity: Goal: Risk for activity intolerance will decrease Outcome: Progressing Note:  Ambulates in room without difficulty.   Problem: Elimination: Goal: Will not experience complications related to bowel motility Outcome: Progressing Note:  No s/s of complications with bowel motility. Goal: Will not experience complications related to urinary retention Outcome: Progressing Note:  Voiding without difficulty; adequate urine output.

## 2018-05-22 NOTE — Progress Notes (Signed)
Patient had a 7 beat run of VT. Upon assessment patient denied SOB, palpations, and cp. HR 90-110s, 99% RA.

## 2018-05-22 NOTE — Transfer of Care (Signed)
Immediate Anesthesia Transfer of Care Note  Patient: Rachel Park  Procedure(s) Performed: CARDIOVERSION (N/A )  Patient Location: Endoscopy Unit  Anesthesia Type:General  Level of Consciousness: awake, alert , oriented and patient cooperative  Airway & Oxygen Therapy: Patient Spontanous Breathing and Patient connected to nasal cannula oxygen  Post-op Assessment: Report given to RN and Post -op Vital signs reviewed and stable  Post vital signs: Reviewed and stable  Last Vitals:  Vitals Value Taken Time  BP    Temp    Pulse    Resp    SpO2      Last Pain:  Vitals:   05/22/18 1035  TempSrc: Oral  PainSc: 0-No pain      Patients Stated Pain Goal: 0 (17/61/60 7371)  Complications: No apparent anesthesia complications

## 2018-05-23 LAB — BASIC METABOLIC PANEL
Anion gap: 7 (ref 5–15)
BUN: 16 mg/dL (ref 8–23)
CALCIUM: 8.7 mg/dL — AB (ref 8.9–10.3)
CO2: 23 mmol/L (ref 22–32)
Chloride: 107 mmol/L (ref 98–111)
Creatinine, Ser: 0.66 mg/dL (ref 0.44–1.00)
Glucose, Bld: 102 mg/dL — ABNORMAL HIGH (ref 70–99)
Potassium: 4.3 mmol/L (ref 3.5–5.1)
Sodium: 137 mmol/L (ref 135–145)

## 2018-05-23 LAB — MAGNESIUM: Magnesium: 2 mg/dL (ref 1.7–2.4)

## 2018-05-23 MED ORDER — MAGNESIUM SULFATE 2 GM/50ML IV SOLN
2.0000 g | Freq: Once | INTRAVENOUS | Status: AC
  Administered 2018-05-23: 2 g via INTRAVENOUS
  Filled 2018-05-23: qty 50

## 2018-05-23 MED ORDER — DOFETILIDE 500 MCG PO CAPS: 500.0000 ug | ORAL_CAPSULE | Freq: Two times a day (BID) | ORAL | 0 refills | Status: DC

## 2018-05-23 MED ORDER — DOFETILIDE 500 MCG PO CAPS: 500.0000 ug | ORAL_CAPSULE | Freq: Two times a day (BID) | ORAL | 6 refills | Status: DC

## 2018-05-23 MED FILL — TIKOSYN 500 MCG CAPS: 500 | 7 days supply | Qty: 14 | Fill #0

## 2018-05-23 NOTE — Care Management Important Message (Signed)
Important Message  Patient Details  Name: Rachel Park MRN: [id number] Date of Birth: [birth year]   Medicare Important Message Given:  Yes    Nyela Cortinas Montine Circle 05/23/2018, 1:39 PM

## 2018-05-23 NOTE — Progress Notes (Signed)
Patient had three pauses, less than 3 sec, still in afib. Upon assessment, patient denies cp, sob, or palpitations.  Paged MD, with updates. Will continue to monitor patient.

## 2018-05-23 NOTE — Discharge Instructions (Signed)
You have an appointment set up with the Atrial Fibrillation Clinic.  Multiple studies have shown that being followed by a dedicated atrial fibrillation clinic in addition to the standard care you receive from your other physicians improves health. We believe that enrollment in the atrial fibrillation clinic will allow us to better care for you.  ° °The phone number to the Atrial Fibrillation Clinic is [phone]. The clinic is staffed Monday through Friday from 8:30am to 5pm. ° °Parking Directions: The clinic is located in the Heart and Vascular Building connected to [facility]. °1)From [address] turn on to [address] and go to the 3rd entrance  (Heart and Vascular entrance) on the right. °2)Look to the right for Heart &Vascular Parking Garage. °3)A code for the entrance is required please call the clinic to receive this.   °4)Take the elevators to the 1st floor. Registration is in the room with the glass walls at the end of the hallway. ° °If you have any trouble parking or locating the clinic, please don’t hesitate to call [phone]. ° ° ° °Information on my medicine - ELIQUIS® (apixaban) ° °This medication education was reviewed with me or my healthcare representative as part of my discharge preparation.  The pharmacist that spoke with me during my hospital stay was:  Danyl Deems T Fountain Derusha, RPH ° °Why was Eliquis® prescribed for you? °Eliquis® was prescribed for you to reduce the risk of a blood clot forming that can cause a stroke if you have a medical condition called atrial fibrillation (a type of irregular heartbeat). ° °What do You need to know about Eliquis® ? °Take your Eliquis® TWICE DAILY - one tablet in the morning and one tablet in the evening with or without food. If you have difficulty swallowing the tablet whole please discuss with your pharmacist how to take the medication safely. ° °Take Eliquis® exactly as prescribed by your doctor and DO NOT stop taking Eliquis® without  talking to the doctor who prescribed the medication.  Stopping may increase your risk of developing a stroke.  Refill your prescription before you run out. ° °After discharge, you should have regular check-up appointments with your healthcare provider that is prescribing your Eliquis®.  In the future your dose may need to be changed if your kidney function or weight changes by a significant amount or as you get older. ° °What do you do if you miss a dose? °If you miss a dose, take it as soon as you remember on the same day and resume taking twice daily.  Do not take more than one dose of ELIQUIS at the same time to make up a missed dose. ° °Important Safety Information °A possible side effect of Eliquis® is bleeding. You should call your healthcare provider right away if you experience any of the following: °? Bleeding from an injury or your nose that does not stop. °? Unusual colored urine (red or dark brown) or unusual colored stools (red or black). °? Unusual bruising for unknown reasons. °? A serious fall or if you hit your head (even if there is no bleeding). ° °Some medicines may interact with Eliquis® and might increase your risk of bleeding or clotting while on Eliquis®. To help avoid this, consult your healthcare provider or pharmacist prior to using any new prescription or non-prescription medications, including herbals, vitamins, non-steroidal anti-inflammatory drugs (NSAIDs) and supplements. ° °This website has more information on Eliquis® (apixaban): http://www.eliquis.com/eliquis/home ° °

## 2018-05-23 NOTE — Discharge Summary (Addendum)
ELECTROPHYSIOLOGY PROCEDURE DISCHARGE SUMMARY    Patient ID: Rachel Park,  MRN: [id number], DOB/AGE: [birth year] 83 y.o.  Admit date: 05/20/2018 Discharge date: 05/23/2018  Primary Care Physician: Lajean Manes, MD  Primary Cardiologist: Dr. Acie Fredrickson Electrophysiologist: Dr. Curt Bears  Primary Discharge Diagnosis:  1.  persistent atrial fibrillation status post Tikosyn loading this admission      CHA2DS2Vasc is 7, on Eliquis, appropriately dosed  Secondary Discharge Diagnosis:  1. CAD 2. CVA (old) 3. HTN 4. Hypothyroidism  Allergies  Allergen Reactions  . Anesthesia S-I-60 Nausea And Vomiting    "with hysterectomy years back had post op N/V"     Procedures This Admission:  1.  Tikosyn loading 2.  Direct current cardioversion on 05/22/2018 by Dr which successfully restored SR, though had ERAF.        There were no early apparent complications.   Brief HPI: Rachel Park is a 85 y.o. female with a past medical history as noted above.  She is followed by EP in the outpatient setting for her atrial fibrillation.  Risks, benefits, and alternatives to Tikosyn were reviewed with the patient who wished to proceed.    Hospital Course:  The patient was admitted and Tikosyn was initiated.  Renal function and electrolytes were followed during the hospitalization and remained stable, no changes to home regime required.  Her QTc remained stable.  She converted to SR/SB after her first dose of drug though reverted back to AF.  On 05/22/2018 she underwent direct current cardioversion (x3 shocks) which restored sinus rhythm though had ERAF only seconds after each shock.  She was monitored until discharge on telemetry.  She is in AFib/flutter at time of discharge.  Rates generally 110's (90's-120's), she has had some sinus beats overnight, and last EKG is a flutter.   Her sinus rates are 50's--60's, and Robertha Staples not push on rate limiting for now.   Dr. Curt Bears discussed with the patient.  Plan is to  continue Tikosyn, Mitsuru Dault see how her rhythm looks at her 1 week visit and go from there.  If not in SR could consider DCCV on Tikosyn again, vs change AAD therapy to amiodarone.  On the day of discharge, the patient was examined by Dr Curt Bears who considered her stable for discharge to home.  Follow-up has been arranged with the AFib clinic in 1 week and with Dr Curt Bears in 4 weeks.   Physical Exam: Vitals:   05/22/18 1300 05/22/18 2207 05/23/18 0700 05/23/18 0839  BP: 119/74 132/88  117/89  Pulse: (!) 110 85  (!) 113  Resp:  18    Temp: 97.7 F (36.5 C) 97.9 F (36.6 C)  98.4 F (36.9 C)  TempSrc: Oral Oral  Oral  SpO2: 98%   95%  Weight:   67 kg   Height:        GEN- The patient is well appearing, alert and oriented x 3 today.   HEENT: normocephalic, atraumatic; sclera clear, conjunctiva pink; hearing intact; neck supple, no JVP Lymph- no cervical lymphadenopathy Lungs- CTA b/l, normal work of breathing.  No wheezes, rales, rhonchi Heart- irreg-irreg, no murmurs, rubs or gallops GI- soft, non-tender, non-distended Extremities- no clubbing, cyanosis, or edema MS- no significant deformity or atrophy Skin- warm and dry, no rash or lesion Psych- euthymic mood, full affect Neuro- strength and sensation are intact   Labs:   Lab Results  Component Value Date   WBC 10.3 04/21/2018   HGB 13.5 04/21/2018  HCT 40.6 04/21/2018   MCV 92.7 04/21/2018   PLT 234 04/21/2018    Recent Labs  Lab 05/23/18 0346  NA 137  K 4.3  CL 107  CO2 23  BUN 16  CREATININE 0.66  CALCIUM 8.7*  GLUCOSE 102*     Discharge Medications:  Allergies as of 05/23/2018      Reactions   Anesthesia S-i-60 Nausea And Vomiting   "with hysterectomy years back had post op N/V"      Medication List    TAKE these medications   acetaminophen 500 MG tablet Commonly known as:  TYLENOL Take 500 mg by mouth every 6 (six) hours as needed for mild pain.   apixaban 5 MG Tabs tablet Commonly known as:   ELIQUIS Take 1 tablet (5 mg total) by mouth 2 (two) times daily.   cholecalciferol 25 MCG (1000 UT) tablet Commonly known as:  VITAMIN D Take 2,000 Units by mouth daily with lunch.   diltiazem 120 MG 24 hr capsule Commonly known as:  CARDIZEM CD Take 1 capsule (120 mg total) by mouth daily.   dofetilide 500 MCG capsule Commonly known as:  TIKOSYN Take 1 capsule (500 mcg total) by mouth 2 (two) times daily.   levothyroxine 75 MCG tablet Commonly known as:  SYNTHROID, LEVOTHROID Take 75 mcg by mouth daily before breakfast.   metoprolol tartrate 25 MG tablet Commonly known as:  LOPRESSOR Take 1 tablet (25 mg total) by mouth 2 (two) times daily.   Nitrostat 0.4 MG SL tablet Generic drug:  nitroGLYCERIN Place 1 tablet (0.4 mg total) under the tongue every 5 (five) minutes as needed. Chest pain   rosuvastatin 10 MG tablet Commonly known as:  CRESTOR Take 1 tablet (10 mg total) by mouth daily. What changed:  when to take this   Systane Ultra 0.4-0.3 % Soln Generic drug:  Polyethyl Glycol-Propyl Glycol Place 1-2 drops into both eyes 3 (three) times daily as needed (dry eyes).       Disposition:  Home  Discharge Instructions    Diet - low sodium heart healthy   Complete by:  As directed    Increase activity slowly   Complete by:  As directed      Follow-up Information    [facility].   Specialty:  Cardiology Why:  05/30/2018 @ 2:00PM Contact information: [address] [id number] [place] [address] [phone]       Constance Haw, MD Follow up.   Specialty:  Cardiology Why:  06/24/2018 @ 12:00PM (noon) Contact information: [place] [address] [phone]           Duration of Discharge Encounter: Greater than 30 minutes including physician time.  Venetia Night, PA-C 05/23/2018 12:37 PM    I have seen and examined this patient with Tommye Standard.   Agree with above, note added to reflect my findings.  On exam, iRRR, no murmurs, lungs clear.   Admitted to the hospital for dofetilide loading.  She converted after her first dose, but subsequently went back into atrial fibrillation and had no further sinus rhythm which was sustained.  She did have a cardioversion, but quickly went back into atrial fibrillation.  On her last night in the hospital, she did have a few sinus beats.  She is now in atrial flutter.  At this point, we Lajoya Dombek plan for discharge with follow-up in A. fib clinic.  Should she return to  A. fib clinic in sinus rhythm, would continue dofetilide.  Should she return in atrial fibrillation, would likely plan for cardioversion.  If cardioversion does not hold, Beverlyann Broxterman plan to load with amiodarone.  Athziri Freundlich M. Chantea Surace MD 05/23/2018 4:46 PM

## 2018-05-23 NOTE — Progress Notes (Addendum)
VSS, remains in AFib Telemetry reviewed She remains in AFib, rate not ideally controlled though her sinus rates 50's will not push rate control. Nonsustained WC beats are felt to be aberrantly conducted beats, they are not seen in sinus QTc is stable She has had some sinus overnight, and will finish tikosyn load, keep her 1 week follow up.  She may return to SR.  Dr. Curt Bears has seen/examined and discussed the plan/recommendation with the patient.  Also discussed if Tikosyn does not get/keep her in SR, may consider repeat DCCV in a few weeks or consider change to amiodarone.  She is disappointed though hopeful and agreeable to the plan.    Anticipate discharge this afternoon   Tommye Standard, PA-C

## 2018-05-23 NOTE — Progress Notes (Signed)
Pharmacy Consult for [place] Electrolyte Replacement  Pharmacy consulted to assist in monitoring and replacing electrolytes in this 85 y.o. female admitted on 05/20/2018 undergoing dofetilide initiation. First dofetilide dose:3/16@2004   Labs:    Component Value Date/Time   K 4.3 05/23/2018 0346   MG 2.0 05/23/2018 0346     Plan: Potassium: K >/= 4: No additional supplementation needed  Magnesium: Mg 1.8-2: Mg 2 gm IV x1 to prevent Mg from dropping below 1.8 - do not need to recheck Mg  Thank you for allowing pharmacy to participate in this patient's care   Claiborne Billings, PharmD PGY2 Cardiology Pharmacy Resident Please check AMION for all Pharmacist numbers by unit 05/23/2018 6:52 AM

## 2018-05-24 ENCOUNTER — Other Ambulatory Visit: Payer: Self-pay | Admitting: Cardiology

## 2018-05-24 ENCOUNTER — Telehealth: Payer: Self-pay | Admitting: Cardiology

## 2018-05-24 ENCOUNTER — Other Ambulatory Visit: Payer: Self-pay

## 2018-05-24 MED ORDER — APIXABAN 5 MG PO TABS: 5.0000 mg | ORAL_TABLET | Freq: Two times a day (BID) | ORAL | 3 refills | Status: DC

## 2018-05-24 MED ORDER — DILTIAZEM HCL ER COATED BEADS 120 MG PO CP24: 120.0000 mg | ORAL_CAPSULE | Freq: Every day | ORAL | 0 refills | Status: DC

## 2018-05-24 MED ORDER — DILTIAZEM HCL ER COATED BEADS 120 MG PO CP24: 120.0000 mg | ORAL_CAPSULE | Freq: Every day | ORAL | 3 refills | Status: DC

## 2018-05-24 MED ORDER — APIXABAN 5 MG PO TABS: 5.0000 mg | ORAL_TABLET | Freq: Two times a day (BID) | ORAL | 0 refills | Status: DC

## 2018-05-24 NOTE — Telephone Encounter (Signed)
°*  STAT* If patient is at the pharmacy, call can be transferred to refill team.   1. Which medications need to be refilled? (please list name of each medication and dose if known) Eliquis   2. Which pharmacy/location (including street and city if local pharmacy) is medication to be sent to? [facility]   3. Do they need a 30 day or 90 day supply? [place]

## 2018-05-24 NOTE — Telephone Encounter (Signed)
New Message:    Patient calling to report that she just got out of the hospital. Please call patient back. Also had a refill Eliquis. Patient is out of medication for 60 days.

## 2018-05-24 NOTE — Telephone Encounter (Signed)
Outpatient Medication Detail    Disp Refills Start End   apixaban (ELIQUIS) 5 MG TABS tablet 60 tablet 0 05/24/2018    Sig - Route: Take 1 tablet (5 mg total) by mouth 2 (two) times daily. - Oral   Sent to pharmacy as: apixaban (ELIQUIS) 5 MG Tab tablet   E-Prescribing Status: Receipt confirmed by pharmacy (05/24/2018 11:15 AM EDT)   Pharmacy   [facility], [place] - [address]

## 2018-05-24 NOTE — Telephone Encounter (Signed)
Pt needs refills sent in for Eliquis & Diltiazem.   Rx sent per request. Pt appreciates the assistance.

## 2018-05-29 ENCOUNTER — Telehealth: Payer: Self-pay | Admitting: Cardiology

## 2018-05-29 ENCOUNTER — Encounter (HOSPITAL_COMMUNITY): Payer: Self-pay | Admitting: *Deleted

## 2018-05-29 ENCOUNTER — Other Ambulatory Visit: Payer: Self-pay

## 2018-05-29 ENCOUNTER — Other Ambulatory Visit (HOSPITAL_COMMUNITY): Payer: Self-pay | Admitting: *Deleted

## 2018-05-29 NOTE — Progress Notes (Signed)
Spoke with Dirk Dress at Dr. Curt Bears office to clarify pt instructions for procedure; awaiting a return call.

## 2018-05-29 NOTE — Telephone Encounter (Signed)
I spoke with Dr. Sherren Mocha Early regarding Mrs. Rachel Park tomorrow. Pt was confused about where to go and when. After discussion with Dr. Curt Bears, it was decided that it would be less contact for the patient and staff to just have the pt report to Admitting and then Endo, given the COVID 19. Pt will have all necessary labs and an EKG done in Endo. Dr. Donnetta Hutching (pt's son) will clarify all the instructions with the pt.   Pt needs to arrive at [facility] Admitting at 11 am on 3/26. Pt is to be NPO after midnight. Pt also needs to take her Eliquis the am of the 26th.

## 2018-05-29 NOTE — Progress Notes (Signed)
Pt denies any acute cardiopulmonary issues since recent admission. Pt was instructed to clarify pre-op instructions with cardiologist after stating that she was instructed to go to the clinic prior to coming to [facility].Prior to speaking with Dorothyann Gibbs, RN, pre-op nurse instructed pt to come to [facility] and check in at Admitting at 10:30 A.M. ( time that pt stated that she was given). Pt made aware to stop taking vitamins and herbal medications. Pt verbalized understanding of all pre-op instructions.  Nurse spoke with Dondra Spry, RN, to confirm 10:30 A.M. arrival at [facility] Admitting when [facility] stated that Dr. Donnetta Hutching was given instructions and that she would follow up with pt again to make her aware that she does not need to come to the office but to [facility] instead.  Pt denies: Have you experienced the following symptoms:  Cough yes/no: No Fever (>100.29F)  yes/no: No Runny nose yes/no: No Sore throat yes/no: No Difficulty breathing/shortness of breath  yes/no: No Have you or a family member traveled in the last 14 days and where? yes/no: No

## 2018-05-29 NOTE — Telephone Encounter (Signed)
° °  Ester from [employer], patient is still unsure about instructions.  Please clarify  Ester can be reached at [phone]

## 2018-05-29 NOTE — Telephone Encounter (Signed)
Pt called because she needs more clarification about her procedure tomorrow, directions beforehand, what medications she can and can not take, if she has to fast, and most importantly a location. She is unsure and overwhelmed about the whole situation

## 2018-05-29 NOTE — Telephone Encounter (Signed)
Spoke with Ester at [place] who confirmed pts cardioversion to be done at 10:30 am. Pt should report to admitting at [facility] hospital tomorrow morning. Ester from Endo stated she has went over all instructions with the pt. I have called pt and confirmed. Pt has verbalized understanding.

## 2018-05-30 ENCOUNTER — Encounter (HOSPITAL_COMMUNITY): Payer: Self-pay | Admitting: Registered Nurse

## 2018-05-30 ENCOUNTER — Telehealth (HOSPITAL_COMMUNITY): Payer: Medicare HMO | Admitting: Physician Assistant

## 2018-05-30 ENCOUNTER — Ambulatory Visit (HOSPITAL_COMMUNITY)
Admission: RE | Admit: 2018-05-30 | Discharge: 2018-05-30 | Disposition: A | Discharge status: Home / Self Care | Payer: Medicare HMO | Attending: Cardiovascular Disease | Admitting: Cardiovascular Disease

## 2018-05-30 ENCOUNTER — Other Ambulatory Visit: Payer: Self-pay

## 2018-05-30 ENCOUNTER — Telehealth: Payer: Self-pay | Admitting: Cardiology

## 2018-05-30 ENCOUNTER — Encounter (HOSPITAL_COMMUNITY)
Admission: RE | Disposition: A | Discharge status: Home / Self Care | Payer: Self-pay | Source: Home / Self Care | Attending: Cardiovascular Disease

## 2018-05-30 DIAGNOSIS — Z539 Procedure and treatment not carried out, unspecified reason: Secondary | ICD-10-CM | POA: Insufficient documentation

## 2018-05-30 HISTORY — DX: Presence of spectacles and contact lenses: Z97.3

## 2018-05-30 SURGERY — CANCELLED PROCEDURE

## 2018-05-30 MED ORDER — AMIODARONE HCL 200 MG PO TABS: ORAL_TABLET | ORAL | 3 refills | Status: DC

## 2018-05-30 NOTE — H&P (Signed)
Presented in normal rhythm. Procedure canceled.

## 2018-05-30 NOTE — Progress Notes (Signed)
Patient in pre-procedure for cardioversion.  Patient in SR rate 60s.  Dr. Sallyanne Kuster at bedside. Cardioversion canceled.  Patient discharged home with family.  Vista Lawman, RN

## 2018-05-30 NOTE — Telephone Encounter (Signed)
Patient's son called saying his mother was scheduled for a cardioversion today but they did not do it because she was is sinus rhythm.  Dr. Curt Bears told him he was going to call the office and have Sherri call in a script for Amiodarone 200 mg BID.   He states this was over 4 hrs ago, there is still no script at [facility] he wants to know what is going on.   The script can be sent to [place], [place] - [address]  Patient's son Curt Jews can be reached at [phone].

## 2018-05-30 NOTE — Telephone Encounter (Addendum)
Spoke to patient. Rx sent to requested pharmacy. Son and patient both made aware.

## 2018-06-04 ENCOUNTER — Telehealth: Payer: Self-pay

## 2018-06-04 ENCOUNTER — Telehealth: Payer: Self-pay | Admitting: Cardiovascular Disease

## 2018-06-04 NOTE — Telephone Encounter (Signed)
Pt has given consent for televisit on 06/05/2018 at 9am with Dr. Acie Fredrickson. Pt has been informed to have BP, HR, and weight ready for appt.       YOUR CARDIOLOGY TEAM HAS ARRANGED FOR AN E-VISIT FOR YOUR APPOINTMENT - PLEASE REVIEW IMPORTANT INFORMATION BELOW SEVERAL DAYS PRIOR TO YOUR APPOINTMENT  Due to the recent COVID-19 pandemic, we are transitioning in-person office visits to tele-medicine visits in an effort to decrease unnecessary exposure to our patients and staff. Medicare and most insurances are covering these visits without a copay needed. You will need a working email and a smartphone or computer with a camera and microphone. For patients that do not have these items, we can still complete the visit using a telephone but do prefer video when possible. If possible, we also ask that you have a blood pressure cuff and scale at home to measure your blood pressure, heart rate and weight prior to your scheduled appointment. Patients with clinical needs that need an in-person evaluation and testing will still be able to come to the office if absolutely necessary. If you have any questions, feel free to call our office.     DOWNLOADING THE SOFTWARE  Download the [employer] app to enable video and telephone visits with your [facility] Provider.   Instructions for downloading [employer] WebEx: - Go to https://www.webex.com/downloads.html and follow the instructions, or download the app on your smartphone [facility] [employer] Meetings). - If you have technical difficulties with downloading WebEx, please call WebEx at [phone]. - Once the app is downloaded (can be done on either mobile or desktop computer), go to Settings in the upper left hand corner.  Be sure that camera and audio are enabled.  - You will receive an email message with a link to the meeting with a time to join for your tele-health visit.  - Please download the app and have settings configured prior to the appointment time.       2-3 DAYS BEFORE YOUR APPOINTMENT  One of our staff will call you to confirm that you have been able to set up your WebEx account. We will remind you check your blood pressure, heart rate and weight prior to your scheduled appointment. If you have an Apple Watch or Kardia, please upload any pertinent ECG strips the day before or morning of your appointment to [place]. Our staff will also make sure you have reviewed the consent and agree to move forward with your scheduled tele-health visit.    THE DAY OF YOUR APPOINTMENT  Approximately 15-20 minutes prior to your scheduled appointment, you will receive an e-mail directly from one of our staff member's [email or web address] e-mail accounts inviting you to join a WebEx meeting.  Please do not reply to that email - simply join the [employer].  Upon joining, a member of the office staff will speak with you initially through the WebEx platform to confirm medications, vital signs for the day and any symptoms you may be experiencing.  Please have this information available prior to the time of visit start.      CONSENT FOR TELE-HEALTH VISIT - PLEASE RVIEW  I hereby voluntarily request, consent and authorize [facility] and its employed or contracted physicians, physician assistants, nurse practitioners or other licensed health care professionals (the Practitioner), to provide me with telemedicine health care services (the "Services") as deemed necessary by the treating Practitioner. I acknowledge and consent to receive the Services by the Practitioner via telemedicine. I understand that the telemedicine  visit will involve communicating with the Practitioner through live audiovisual communication technology and the disclosure of certain medical information by electronic transmission. I acknowledge that I have been given the opportunity to request an in-person assessment or other available alternative prior to the telemedicine visit and am voluntarily  participating in the telemedicine visit.  I understand that I have the right to withhold or withdraw my consent to the use of telemedicine in the course of my care at any time, without affecting my right to future care or treatment, and that the Practitioner or I may terminate the telemedicine visit at any time. I understand that I have the right to inspect all information obtained and/or recorded in the course of the telemedicine visit and may receive copies of available information for a reasonable fee.  I understand that some of the potential risks of receiving the Services via telemedicine include:  Marland Kitchen Delay or interruption in medical evaluation due to technological equipment failure or disruption; . Information transmitted may not be sufficient (e.g. poor resolution of images) to allow for appropriate medical decision making by the Practitioner; and/or  . In rare instances, security protocols could fail, causing a breach of personal health information.  Furthermore, I acknowledge that it is my responsibility to provide information about my medical history, conditions and care that is complete and accurate to the best of my ability. I acknowledge that Practitioner's advice, recommendations, and/or decision may be based on factors not within their control, such as incomplete or inaccurate data provided by me or distortions of diagnostic images or specimens that may result from electronic transmissions. I understand that the practice of medicine is not an exact science and that Practitioner makes no warranties or guarantees regarding treatment outcomes. I acknowledge that I will receive a copy of this consent concurrently upon execution via email to the email address I last provided but may also request a printed copy by calling the office of [place].    I understand that my insurance will be billed for this visit.   I have read or had this consent read to me. . I understand the contents of this  consent, which adequately explains the benefits and risks of the Services being provided via telemedicine.  . I have been provided ample opportunity to ask questions regarding this consent and the Services and have had my questions answered to my satisfaction. . I give my informed consent for the services to be provided through the use of telemedicine in my medical care  By participating in this telemedicine visit I agree to the above.

## 2018-06-04 NOTE — Telephone Encounter (Signed)
Spoke with patient regarding upcoming visit.  She prefers to use her land line for a phone visit. Confirmed all demographics, e-mail and My Chart activation.

## 2018-06-05 ENCOUNTER — Encounter: Payer: Self-pay | Admitting: Cardiovascular Disease

## 2018-06-05 ENCOUNTER — Other Ambulatory Visit: Payer: Self-pay

## 2018-06-05 ENCOUNTER — Telehealth (INDEPENDENT_AMBULATORY_CARE_PROVIDER_SITE_OTHER): Payer: Medicare HMO | Admitting: Cardiovascular Disease

## 2018-06-05 VITALS — BP 130/89 | HR 95 | Ht 62.5 in | Wt 138.0 lb

## 2018-06-05 DIAGNOSIS — E785 Hyperlipidemia, unspecified: Secondary | ICD-10-CM | POA: Diagnosis not present

## 2018-06-05 DIAGNOSIS — Z7901 Long term (current) use of anticoagulants: Secondary | ICD-10-CM | POA: Diagnosis not present

## 2018-06-05 DIAGNOSIS — I25119 Atherosclerotic heart disease of native coronary artery with unspecified angina pectoris: Secondary | ICD-10-CM

## 2018-06-05 DIAGNOSIS — I480 Paroxysmal atrial fibrillation: Secondary | ICD-10-CM

## 2018-06-05 DIAGNOSIS — Z79899 Other long term (current) drug therapy: Secondary | ICD-10-CM | POA: Diagnosis not present

## 2018-06-05 DIAGNOSIS — Z955 Presence of coronary angioplasty implant and graft: Secondary | ICD-10-CM | POA: Diagnosis not present

## 2018-06-05 DIAGNOSIS — I4819 Other persistent atrial fibrillation: Secondary | ICD-10-CM

## 2018-06-05 DIAGNOSIS — Z7189 Other specified counseling: Secondary | ICD-10-CM

## 2018-06-05 DIAGNOSIS — I2510 Atherosclerotic heart disease of native coronary artery without angina pectoris: Secondary | ICD-10-CM

## 2018-06-05 MED ORDER — DILTIAZEM HCL ER COATED BEADS 180 MG PO CP24: 180.0000 mg | ORAL_CAPSULE | Freq: Every day | ORAL | 2 refills | Status: DC

## 2018-06-05 NOTE — Progress Notes (Signed)
Virtual Visit via Telephone Note    Evaluation Performed:  Follow-up visit  This visit type was conducted due to national recommendations for restrictions regarding the COVID-19 Pandemic (e.g. social distancing).  This format is felt to be most appropriate for this patient at this time.  All issues noted in this document were discussed and addressed.  No physical exam was performed (except for noted visual exam findings with Video Visits).  Please refer to the patient's chart (MyChart message for video visits and phone note for telephone visits) for the patient's consent to telehealth for [facility].  Date:  06/05/2018   ID:  Rachel Park, DOB 1931-12-13, MRN [id number]  Patient Location:  Home   Provider location:   [facility], [place], [place]   PCP:  Lajean Manes, MD  Cardiologist:  Mertie Moores, MD  Electrophysiologist:  Constance Haw, MD   Chief Complaint:  Atrial fib  History of Present Illness:    Rachel Park is a 85 y.o. female who presents via audio/video conferencing for a telehealth visit today.    Rachel Park is a 85 yo with hx of CAD, s/p PCI, ( complicated by a stroke).  She has had paroxysmal atrial fibrillation.  CHADS2VASC [place]  She had a persistent episode of atrial fibrillation and was scheduled for cardioversion in early March, 2020.    She was started on Tikosyn.  But this didn't hold her in NSR .  She was changed to amiodarone 200 BID  Has stayed on Eliquis   When she arrived to the short stay center for cardioversion, she was in normal sinus rhythm.  She was started on amiodarone 200 mg twice a day at that point.  Still have episodes of PAF. Cannot really feel the atrial fib She feels well on some days and fatigued on some days She has noticed that her HR when she is in AF is not as high as it used to be.  Has not been exerciseing due to the cold weather  And dyspnea and lack of energy.  PAF episodes can last for 1-2 days or may just  last for hours.    The patient does not have symptoms concerning for COVID-19 infection (fever, chills, cough, or new shortness of breath).    Prior CV studies:   The following studies were reviewed today:    Past Medical History:  Diagnosis Date  . Anginal pain ([place])   . Arthritis   . Complication of anesthesia   . Coronary artery disease   . Dyspnea    exertion  . Hypertension   . Hypothyroidism   . PAF (paroxysmal atrial fibrillation) ([place])    on Xarelto for maybe a year, opted to stop  . PONV (postoperative nausea and vomiting)   . Renal infarction ([place])   . Small bowel obstruction ([place]) 06/2015  . Stroke [facility])    oct 2016  . TIA (transient ischemic attack) 03/24/2015  . Vertigo, benign positional    to be evaluated, intermittent  . Wears glasses    Past Surgical History:  Procedure Laterality Date  . ABDOMINAL HYSTERECTOMY    . CARDIAC CATHETERIZATION N/A 12/24/2014   Procedure: Left Heart Cath and Coronary Angiography;  Surgeon: Sherren Mocha, MD;  Location: [place] CV LAB;  Service: Cardiovascular;  Laterality: N/A;  . CARDIOVERSION N/A 05/22/2018   Procedure: CARDIOVERSION;  Surgeon: Josue Hector, MD;  Location: [facility] ENDOSCOPY;  Service: Cardiovascular;  Laterality: N/A;  . CATARACT EXTRACTION Bilateral   .  COLONOSCOPY WITH PROPOFOL N/A 12/01/2013   Procedure: COLONOSCOPY WITH PROPOFOL;  Surgeon: Garlan Fair, MD;  Location: [facility] ENDOSCOPY;  Service: Endoscopy;  Laterality: N/A;  . INCISIONAL HERNIA REPAIR N/A 03/10/2016   Procedure: INCISIONAL HERNIA REPAIR TIMES TWO;  Surgeon: Armandina Gemma, MD;  Location: [place];  Service: General;  Laterality: N/A;  . INGUINAL HERNIA REPAIR Bilateral 03/10/2016   Procedure: BILATERAL INGUINAL HERNIA REPAIRS;  Surgeon: Armandina Gemma, MD;  Location: [place];  Service: General;  Laterality: Bilateral;  . INSERTION OF MESH N/A 03/10/2016   Procedure: INSERTION OF MESH TO BILATERAL GROINS AND ABDOMEN;  Surgeon: Armandina Gemma, MD;   Location: [place];  Service: General;  Laterality: N/A;  . laparotomy  06/2015  . LEFT HEART CATH AND CORONARY ANGIOGRAPHY N/A 09/27/2016   Procedure: Left Heart Cath and Coronary Angiography;  Surgeon: Sherren Mocha, MD;  Location: [place] CV LAB;  Service: Cardiovascular;  Laterality: N/A;  . TONSILLECTOMY       Current Meds  Medication Sig  . acetaminophen (TYLENOL) 500 MG tablet Take 500 mg by mouth every 6 (six) hours as needed for mild pain.   Marland Kitchen amiodarone (PACERONE) 200 MG tablet Take 1 tablet (200 mg total) twice daily for one month.  Then reduce and take 1 tablet daily  . apixaban (ELIQUIS) 5 MG TABS tablet Take 1 tablet (5 mg total) by mouth 2 (two) times daily.  Marland Kitchen CALCIUM PO Take 1 tablet by mouth daily.  . cholecalciferol (VITAMIN D) 1000 UNITS tablet Take 2,000 Units by mouth daily with lunch.   . diltiazem (CARDIZEM CD) 120 MG 24 hr capsule Take 1 capsule (120 mg total) by mouth daily.  Marland Kitchen levothyroxine (SYNTHROID, LEVOTHROID) 75 MCG tablet Take 75 mcg by mouth daily before breakfast.  . metoprolol tartrate (LOPRESSOR) 25 MG tablet Take 1 tablet (25 mg total) by mouth 2 (two) times daily.  Marland Kitchen NITROSTAT 0.4 MG SL tablet Place 1 tablet (0.4 mg total) under the tongue every 5 (five) minutes as needed. Chest pain  . Polyethyl Glycol-Propyl Glycol (SYSTANE ULTRA) 0.4-0.3 % SOLN Place 1-2 drops into both eyes 3 (three) times daily as needed (dry eyes).  . rosuvastatin (CRESTOR) 10 MG tablet Take 10 mg by mouth daily.     Allergies:   Anesthesia s-i-60   Social History   Tobacco Use  . Smoking status: Never Smoker  . Smokeless tobacco: Never Used  Substance Use Topics  . Alcohol use: Not Currently    Comment: occ.  . Drug use: No     Family Hx: The patient's family history includes Cancer in her sister; Heart attack in her father; Stroke in her mother.  ROS:   Please see the history of present illness.     All other systems reviewed and are negative.   Labs/Other  Tests and Data Reviewed:    Recent Labs: 04/21/2018: ALT 29; Hemoglobin 13.5; Platelets 234; TSH 2.863 05/23/2018: BUN 16; Creatinine, Ser 0.66; Magnesium 2.0; Potassium 4.3; Sodium 137   Recent Lipid Panel Lab Results  Component Value Date/Time   CHOL 135 02/21/2018 08:17 AM   TRIG 52 02/21/2018 08:17 AM   HDL 63 02/21/2018 08:17 AM   CHOLHDL 2.1 02/21/2018 08:17 AM   CHOLHDL 2.3 06/30/2015 08:48 AM   LDLCALC 62 02/21/2018 08:17 AM    Wt Readings from Last 3 Encounters:  06/05/18 138 lb (62.6 kg)  05/23/18 147 lb 9.6 oz (67 kg)  05/20/18 147 lb (66.7 kg)  Objective:    Vital Signs:  BP 130/89   Pulse 95   Ht 5' 2.5" (1.588 m)   Wt 138 lb (62.6 kg)   BMI 24.84 kg/m    HR was noted to be irregular  No exam due to the tele-medicine nature of this visit   ASSESSMENT & PLAN:    1.  PAF:   Has failed Tikosyn Is now on amio. Still has episodes of PAF she still has occasional episodes of atrial fibrillation.  She does note that her heart rate seems to be lower than it used to be when she has breakthrough atrial fibrillation.  I suspect this is due to the amiodarone.  We will increase her diltiazem from 120 to 180 mg a day.  She will continue with the metoprolol 25 mg twice a day.  She will continue the Eliquis.  We will call her back in 1 week for a follow-up phone visit/nurse visit with Sharyn Lull.  We will discuss possibly having her take an extra metoprolol for further episodes of breakthrough atrial fibrillation if she still having of them at that time.  Anticipate seeing her back in the office in 3 months either in the office for telemedicine visit.  We will need to get labs at that time.  Her last TSH was February 16 which was normal.  She had a basic metabolic profile several weeks ago which was also within normal limits.  2.  Coronary artery disease: The patient is status post PCI several years ago.  She is not had any episodes of chest pain or shortness of breath.  She  typically has been very active but has been a little less so with a recent turn in the weather.  I have encouraged her to continue with her activity.  3.  Hyperlipidemia: Recent lab work from December, 2019 reveals a total cholesterol of 135.  Her HDL is 63.  The LDL 62.  Triglycerides are 52.   Continue Crestor 10 mg a day.  COVID-19 Education: The signs and symptoms of COVID-19 were discussed with the patient and how to seek care for testing (follow up with PCP or arrange E-visit).  The importance of social distancing was discussed today.  Patient Risk:   After full review of this patient's clinical status, I feel that they are at least moderate risk at this time.  Time:   Today, I have spent 21 minutes with the patient with telehealth technology discussing atrial fib, anticoagulation, CAD .     Medication Adjustments/Labs and Tests Ordered: Current medicines are reviewed at length with the patient today.  Concerns regarding medicines are outlined above.  Tests Ordered: No orders of the defined types were placed in this encounter.  Medication Changes: No orders of the defined types were placed in this encounter.   Disposition:  Follow up in 3 month(s)  Signed, Mertie Moores, MD  06/05/2018 9:00 AM    [facility]

## 2018-06-05 NOTE — Patient Instructions (Addendum)
Medication Instructions:  Your physician has recommended you make the following change in your medication:  DECREASE Amiodarone on April 27 to 200 mg daily INCREASE Diltiazem to 180 mg once daily  If you need a refill on your cardiac medications before your next appointment, please call your pharmacy.   Lab work: Your physician recommends that you return for lab work in: 6 months  You will need to FAST for this appointment - nothing to eat or drink after midnight the night before except water.    Testing/Procedures: None Ordered   Follow-Up: Your physician recommends that you will receive a call from [place], [place] in one week to see how you are doing. Please call the office prior to that time with questions or concerns   At [facility], you and your health needs are our priority.  As part of our continuing mission to provide you with exceptional heart care, we have created designated Provider Care Teams.  These Care Teams include your primary Cardiologist (physician) and Advanced Practice Providers (APPs -  Physician Assistants and Nurse Practitioners) who all work together to provide you with the care you need, when you need it. You will need a follow up appointment in:  3 months on Tuesday July 7 at 9:00 am with Mertie Moores, MD

## 2018-06-11 ENCOUNTER — Telehealth: Payer: Self-pay | Admitting: Nurse Practitioner

## 2018-06-11 NOTE — Telephone Encounter (Signed)
-----   Message from Emmaline Life, RN sent at 06/05/2018 11:51 AM EDT ----- Call patient Tuesday to see how she is doing on higher diltiazem dose

## 2018-06-11 NOTE — Telephone Encounter (Signed)
Spoke with patient who states her BP has improved since increasing her dose of diltiazem. She states her heart rate has been in the 50's bpm most of the time, however this morning it was 100 bpm. She states she feels well, does not know if she is in a fib, and is able to walk for exercise without difficulty. I advised her to continue current regimen and to call back with questions or concerns. She verbalized understanding and agreement and thanked me for the call.

## 2018-06-13 NOTE — Telephone Encounter (Signed)
Continue current doses of Diltiazem, metoprolol and amiodarone. HR seems to be better Will follow up with her via telemedicine visit soon

## 2018-06-17 ENCOUNTER — Encounter (INDEPENDENT_AMBULATORY_CARE_PROVIDER_SITE_OTHER): Payer: Medicare HMO | Admitting: Ophthalmology

## 2018-06-18 ENCOUNTER — Ambulatory Visit: Payer: Medicare HMO | Admitting: Cardiovascular Disease

## 2018-06-24 ENCOUNTER — Other Ambulatory Visit: Payer: Self-pay | Admitting: Cardiovascular Disease

## 2018-06-24 ENCOUNTER — Ambulatory Visit: Payer: Medicare HMO | Admitting: Cardiology

## 2018-06-24 MED ORDER — DILTIAZEM HCL ER COATED BEADS 180 MG PO CP24: 180.0000 mg | ORAL_CAPSULE | Freq: Every day | ORAL | 2 refills | Status: DC

## 2018-06-24 MED ORDER — ROSUVASTATIN CALCIUM 10 MG PO TABS: 10.0000 mg | ORAL_TABLET | Freq: Every day | ORAL | 2 refills | Status: DC

## 2018-06-24 MED ORDER — APIXABAN 5 MG PO TABS: 5.0000 mg | ORAL_TABLET | Freq: Two times a day (BID) | ORAL | 3 refills | Status: DC

## 2018-06-24 MED ORDER — NITROSTAT 0.4 MG SL SUBL: 0.4000 mg | SUBLINGUAL_TABLET | SUBLINGUAL | 6 refills | Status: AC | PRN

## 2018-06-24 MED ORDER — METOPROLOL TARTRATE 25 MG PO TABS: 25.0000 mg | ORAL_TABLET | Freq: Two times a day (BID) | ORAL | 2 refills | Status: DC

## 2018-06-24 MED ORDER — AMIODARONE HCL 200 MG PO TABS: ORAL_TABLET | ORAL | 2 refills | Status: DC

## 2018-06-24 NOTE — Telephone Encounter (Signed)
Pt calling requesting that her medications be resent to her preferred pharmacy. Pt's medications were resent to pt's preferred pharmacy as requested. Confirmation received.

## 2018-07-01 ENCOUNTER — Other Ambulatory Visit: Payer: Medicare HMO

## 2018-07-08 ENCOUNTER — Other Ambulatory Visit: Payer: Medicare HMO

## 2018-07-22 ENCOUNTER — Encounter (HOSPITAL_COMMUNITY): Payer: Self-pay

## 2018-07-22 ENCOUNTER — Other Ambulatory Visit: Payer: Self-pay

## 2018-07-22 ENCOUNTER — Emergency Department (HOSPITAL_COMMUNITY): Payer: Medicare HMO

## 2018-07-22 ENCOUNTER — Inpatient Hospital Stay (HOSPITAL_COMMUNITY)
Admission: EM | Admit: 2018-07-22 | Discharge: 2018-08-12 | DRG: 329 | Disposition: A | Discharge status: Home Health | Payer: Medicare HMO | Attending: Family Medicine | Admitting: Family Medicine

## 2018-07-22 DIAGNOSIS — Z79890 Hormone replacement therapy (postmenopausal): Secondary | ICD-10-CM

## 2018-07-22 DIAGNOSIS — I5033 Acute on chronic diastolic (congestive) heart failure: Secondary | ICD-10-CM | POA: Diagnosis not present

## 2018-07-22 DIAGNOSIS — I4819 Other persistent atrial fibrillation: Secondary | ICD-10-CM | POA: Diagnosis not present

## 2018-07-22 DIAGNOSIS — G4700 Insomnia, unspecified: Secondary | ICD-10-CM | POA: Diagnosis not present

## 2018-07-22 DIAGNOSIS — J9600 Acute respiratory failure, unspecified whether with hypoxia or hypercapnia: Secondary | ICD-10-CM

## 2018-07-22 DIAGNOSIS — Z8249 Family history of ischemic heart disease and other diseases of the circulatory system: Secondary | ICD-10-CM

## 2018-07-22 DIAGNOSIS — I10 Essential (primary) hypertension: Secondary | ICD-10-CM | POA: Diagnosis not present

## 2018-07-22 DIAGNOSIS — I110 Hypertensive heart disease with heart failure: Secondary | ICD-10-CM | POA: Diagnosis present

## 2018-07-22 DIAGNOSIS — Z4682 Encounter for fitting and adjustment of non-vascular catheter: Secondary | ICD-10-CM | POA: Diagnosis not present

## 2018-07-22 DIAGNOSIS — Z8673 Personal history of transient ischemic attack (TIA), and cerebral infarction without residual deficits: Secondary | ICD-10-CM

## 2018-07-22 DIAGNOSIS — G8918 Other acute postprocedural pain: Secondary | ICD-10-CM | POA: Diagnosis not present

## 2018-07-22 DIAGNOSIS — I82629 Acute embolism and thrombosis of deep veins of unspecified upper extremity: Secondary | ICD-10-CM | POA: Diagnosis not present

## 2018-07-22 DIAGNOSIS — E785 Hyperlipidemia, unspecified: Secondary | ICD-10-CM | POA: Diagnosis present

## 2018-07-22 DIAGNOSIS — Z419 Encounter for procedure for purposes other than remedying health state, unspecified: Secondary | ICD-10-CM

## 2018-07-22 DIAGNOSIS — E7800 Pure hypercholesterolemia, unspecified: Secondary | ICD-10-CM | POA: Diagnosis present

## 2018-07-22 DIAGNOSIS — E46 Unspecified protein-calorie malnutrition: Secondary | ICD-10-CM | POA: Diagnosis not present

## 2018-07-22 DIAGNOSIS — Z9289 Personal history of other medical treatment: Secondary | ICD-10-CM | POA: Diagnosis not present

## 2018-07-22 DIAGNOSIS — I4891 Unspecified atrial fibrillation: Secondary | ICD-10-CM | POA: Diagnosis not present

## 2018-07-22 DIAGNOSIS — Z6825 Body mass index (BMI) 25.0-25.9, adult: Secondary | ICD-10-CM

## 2018-07-22 DIAGNOSIS — J449 Chronic obstructive pulmonary disease, unspecified: Secondary | ICD-10-CM | POA: Diagnosis not present

## 2018-07-22 DIAGNOSIS — R112 Nausea with vomiting, unspecified: Secondary | ICD-10-CM | POA: Diagnosis not present

## 2018-07-22 DIAGNOSIS — K5651 Intestinal adhesions [bands], with partial obstruction: Principal | ICD-10-CM | POA: Diagnosis present

## 2018-07-22 DIAGNOSIS — Z955 Presence of coronary angioplasty implant and graft: Secondary | ICD-10-CM | POA: Diagnosis not present

## 2018-07-22 DIAGNOSIS — J9601 Acute respiratory failure with hypoxia: Secondary | ICD-10-CM | POA: Diagnosis not present

## 2018-07-22 DIAGNOSIS — Z803 Family history of malignant neoplasm of breast: Secondary | ICD-10-CM

## 2018-07-22 DIAGNOSIS — F419 Anxiety disorder, unspecified: Secondary | ICD-10-CM | POA: Diagnosis not present

## 2018-07-22 DIAGNOSIS — K432 Incisional hernia without obstruction or gangrene: Secondary | ICD-10-CM | POA: Diagnosis not present

## 2018-07-22 DIAGNOSIS — K55021 Focal (segmental) acute infarction of small intestine: Secondary | ICD-10-CM | POA: Diagnosis present

## 2018-07-22 DIAGNOSIS — I071 Rheumatic tricuspid insufficiency: Secondary | ICD-10-CM | POA: Diagnosis present

## 2018-07-22 DIAGNOSIS — I5031 Acute diastolic (congestive) heart failure: Secondary | ICD-10-CM | POA: Diagnosis not present

## 2018-07-22 DIAGNOSIS — R188 Other ascites: Secondary | ICD-10-CM | POA: Diagnosis not present

## 2018-07-22 DIAGNOSIS — Z789 Other specified health status: Secondary | ICD-10-CM | POA: Diagnosis not present

## 2018-07-22 DIAGNOSIS — I82C11 Acute embolism and thrombosis of right internal jugular vein: Secondary | ICD-10-CM | POA: Diagnosis not present

## 2018-07-22 DIAGNOSIS — K9189 Other postprocedural complications and disorders of digestive system: Secondary | ICD-10-CM | POA: Diagnosis not present

## 2018-07-22 DIAGNOSIS — Z978 Presence of other specified devices: Secondary | ICD-10-CM

## 2018-07-22 DIAGNOSIS — E039 Hypothyroidism, unspecified: Secondary | ICD-10-CM | POA: Diagnosis not present

## 2018-07-22 DIAGNOSIS — I82611 Acute embolism and thrombosis of superficial veins of right upper extremity: Secondary | ICD-10-CM | POA: Diagnosis not present

## 2018-07-22 DIAGNOSIS — K567 Ileus, unspecified: Secondary | ICD-10-CM

## 2018-07-22 DIAGNOSIS — M1990 Unspecified osteoarthritis, unspecified site: Secondary | ICD-10-CM | POA: Diagnosis present

## 2018-07-22 DIAGNOSIS — Z9889 Other specified postprocedural states: Secondary | ICD-10-CM | POA: Diagnosis not present

## 2018-07-22 DIAGNOSIS — S31109A Unspecified open wound of abdominal wall, unspecified quadrant without penetration into peritoneal cavity, initial encounter: Secondary | ICD-10-CM | POA: Diagnosis not present

## 2018-07-22 DIAGNOSIS — Z7901 Long term (current) use of anticoagulants: Secondary | ICD-10-CM | POA: Diagnosis not present

## 2018-07-22 DIAGNOSIS — K922 Gastrointestinal hemorrhage, unspecified: Secondary | ICD-10-CM | POA: Diagnosis not present

## 2018-07-22 DIAGNOSIS — J9811 Atelectasis: Secondary | ICD-10-CM | POA: Diagnosis not present

## 2018-07-22 DIAGNOSIS — J9690 Respiratory failure, unspecified, unspecified whether with hypoxia or hypercapnia: Secondary | ICD-10-CM

## 2018-07-22 DIAGNOSIS — K55019 Acute (reversible) ischemia of small intestine, extent unspecified: Secondary | ICD-10-CM | POA: Diagnosis not present

## 2018-07-22 DIAGNOSIS — Z20828 Contact with and (suspected) exposure to other viral communicable diseases: Secondary | ICD-10-CM | POA: Diagnosis present

## 2018-07-22 DIAGNOSIS — T502X5A Adverse effect of carbonic-anhydrase inhibitors, benzothiadiazides and other diuretics, initial encounter: Secondary | ICD-10-CM | POA: Diagnosis not present

## 2018-07-22 DIAGNOSIS — Z90710 Acquired absence of both cervix and uterus: Secondary | ICD-10-CM

## 2018-07-22 DIAGNOSIS — I82621 Acute embolism and thrombosis of deep veins of right upper extremity: Secondary | ICD-10-CM | POA: Diagnosis not present

## 2018-07-22 DIAGNOSIS — R1084 Generalized abdominal pain: Secondary | ICD-10-CM | POA: Diagnosis not present

## 2018-07-22 DIAGNOSIS — R5381 Other malaise: Secondary | ICD-10-CM | POA: Diagnosis not present

## 2018-07-22 DIAGNOSIS — K5730 Diverticulosis of large intestine without perforation or abscess without bleeding: Secondary | ICD-10-CM | POA: Diagnosis not present

## 2018-07-22 DIAGNOSIS — Z0189 Encounter for other specified special examinations: Secondary | ICD-10-CM

## 2018-07-22 DIAGNOSIS — T8130XA Disruption of wound, unspecified, initial encounter: Secondary | ICD-10-CM | POA: Diagnosis not present

## 2018-07-22 DIAGNOSIS — Z452 Encounter for adjustment and management of vascular access device: Secondary | ICD-10-CM | POA: Diagnosis not present

## 2018-07-22 DIAGNOSIS — R410 Disorientation, unspecified: Secondary | ICD-10-CM | POA: Diagnosis not present

## 2018-07-22 DIAGNOSIS — Z884 Allergy status to anesthetic agent status: Secondary | ICD-10-CM

## 2018-07-22 DIAGNOSIS — R0602 Shortness of breath: Secondary | ICD-10-CM

## 2018-07-22 DIAGNOSIS — I959 Hypotension, unspecified: Secondary | ICD-10-CM | POA: Diagnosis not present

## 2018-07-22 DIAGNOSIS — J90 Pleural effusion, not elsewhere classified: Secondary | ICD-10-CM | POA: Diagnosis not present

## 2018-07-22 DIAGNOSIS — K56609 Unspecified intestinal obstruction, unspecified as to partial versus complete obstruction: Secondary | ICD-10-CM | POA: Diagnosis not present

## 2018-07-22 DIAGNOSIS — K559 Vascular disorder of intestine, unspecified: Secondary | ICD-10-CM | POA: Diagnosis not present

## 2018-07-22 DIAGNOSIS — Z823 Family history of stroke: Secondary | ICD-10-CM | POA: Diagnosis not present

## 2018-07-22 DIAGNOSIS — R579 Shock, unspecified: Secondary | ICD-10-CM | POA: Diagnosis not present

## 2018-07-22 DIAGNOSIS — R918 Other nonspecific abnormal finding of lung field: Secondary | ICD-10-CM | POA: Diagnosis not present

## 2018-07-22 DIAGNOSIS — I2510 Atherosclerotic heart disease of native coronary artery without angina pectoris: Secondary | ICD-10-CM | POA: Diagnosis present

## 2018-07-22 DIAGNOSIS — K769 Liver disease, unspecified: Secondary | ICD-10-CM | POA: Diagnosis present

## 2018-07-22 DIAGNOSIS — Z9911 Dependence on respirator [ventilator] status: Secondary | ICD-10-CM | POA: Diagnosis not present

## 2018-07-22 DIAGNOSIS — Z95828 Presence of other vascular implants and grafts: Secondary | ICD-10-CM

## 2018-07-22 DIAGNOSIS — Z9049 Acquired absence of other specified parts of digestive tract: Secondary | ICD-10-CM | POA: Diagnosis not present

## 2018-07-22 DIAGNOSIS — R609 Edema, unspecified: Secondary | ICD-10-CM | POA: Diagnosis not present

## 2018-07-22 DIAGNOSIS — D62 Acute posthemorrhagic anemia: Secondary | ICD-10-CM | POA: Diagnosis not present

## 2018-07-22 DIAGNOSIS — Z432 Encounter for attention to ileostomy: Secondary | ICD-10-CM | POA: Diagnosis not present

## 2018-07-22 DIAGNOSIS — E876 Hypokalemia: Secondary | ICD-10-CM | POA: Diagnosis not present

## 2018-07-22 DIAGNOSIS — I480 Paroxysmal atrial fibrillation: Secondary | ICD-10-CM | POA: Diagnosis not present

## 2018-07-22 LAB — POCT I-STAT 7, (LYTES, BLD GAS, ICA,H+H)
Acid-base deficit: 2 mmol/L (ref 0.0–2.0)
Bicarbonate: 23.5 mmol/L (ref 20.0–28.0)
Calcium, Ion: 1.25 mmol/L (ref 1.15–1.40)
HCT: 39 % (ref 36.0–46.0)
Hemoglobin: 13.3 g/dL (ref 12.0–15.0)
O2 Saturation: 93 %
Patient temperature: 97.4
Potassium: 4.4 mmol/L (ref 3.5–5.1)
Sodium: 132 mmol/L — ABNORMAL LOW (ref 135–145)
TCO2: 25 mmol/L (ref 22–32)
pCO2 arterial: 41.2 mmHg (ref 32.0–48.0)
pH, Arterial: 7.36 (ref 7.350–7.450)
pO2, Arterial: 68 mmHg — ABNORMAL LOW (ref 83.0–108.0)

## 2018-07-22 LAB — COMPREHENSIVE METABOLIC PANEL
ALT: 42 U/L (ref 0–44)
AST: 38 U/L (ref 15–41)
Albumin: 4.3 g/dL (ref 3.5–5.0)
Alkaline Phosphatase: 104 U/L (ref 38–126)
Anion gap: 15 (ref 5–15)
BUN: 17 mg/dL (ref 8–23)
CO2: 20 mmol/L — ABNORMAL LOW (ref 22–32)
Calcium: 9.9 mg/dL (ref 8.9–10.3)
Chloride: 99 mmol/L (ref 98–111)
Creatinine, Ser: 0.77 mg/dL (ref 0.44–1.00)
GFR calc Af Amer: >60 mL/min (ref >60)
GFR calc non Af Amer: >60 mL/min (ref >60)
Glucose, Bld: 152 mg/dL — ABNORMAL HIGH (ref 70–99)
Potassium: 4 mmol/L (ref 3.5–5.1)
Sodium: 134 mmol/L — ABNORMAL LOW (ref 135–145)
Total Bilirubin: 0.8 mg/dL (ref 0.3–1.2)
Total Protein: 7.4 g/dL (ref 6.5–8.1)

## 2018-07-22 LAB — LACTIC ACID, PLASMA
Lactic Acid, Venous: 2.5 mmol/L (ref 0.5–1.9)
Lactic Acid, Venous: 1.2 mmol/L (ref 0.5–1.9)
Lactic Acid, Venous: 1.2 mmol/L (ref 0.5–1.9)

## 2018-07-22 LAB — CBC
HCT: 42.7 % (ref 36.0–46.0)
Hemoglobin: 14.2 g/dL (ref 12.0–15.0)
MCH: 30.5 pg (ref 26.0–34.0)
MCHC: 33.3 g/dL (ref 30.0–36.0)
MCV: 91.8 fL (ref 80.0–100.0)
Platelets: 274 10*3/uL (ref 150–400)
RBC: 4.65 MIL/uL (ref 3.87–5.11)
RDW: 14.6 % (ref 11.5–15.5)
WBC: 7.5 10*3/uL (ref 4.0–10.5)
nRBC: 0 % (ref 0.0–0.2)

## 2018-07-22 LAB — SARS CORONAVIRUS 2 BY RT PCR (HOSPITAL ORDER, PERFORMED IN ~~LOC~~ HOSPITAL LAB): SARS Coronavirus 2: NEGATIVE

## 2018-07-22 LAB — LIPASE, BLOOD: Lipase: 27 U/L (ref 11–51)

## 2018-07-22 MED ORDER — SODIUM CHLORIDE 0.9% FLUSH: 3.0000 mL | Freq: Once | INTRAVENOUS | Status: DC

## 2018-07-22 MED ORDER — MORPHINE SULFATE (PF) 4 MG/ML IV SOLN
6.0000 mg | Freq: Once | INTRAVENOUS | Status: AC
  Administered 2018-07-22: 6 mg via INTRAVENOUS
  Filled 2018-07-22: qty 2

## 2018-07-22 MED ORDER — IOHEXOL 300 MG/ML  SOLN
100.0000 mL | Freq: Once | INTRAMUSCULAR | Status: AC | PRN
  Administered 2018-07-22: 100 mL via INTRAVENOUS

## 2018-07-22 MED ORDER — DIATRIZOATE MEGLUMINE & SODIUM 66-10 % PO SOLN
90.0000 mL | Freq: Once | ORAL | Status: AC
  Administered 2018-07-22: 90 mL via NASOGASTRIC
  Filled 2018-07-22: qty 90

## 2018-07-22 MED ORDER — MORPHINE SULFATE (PF) 4 MG/ML IV SOLN: 4.0000 mg | INTRAVENOUS | Status: DC | PRN

## 2018-07-22 MED ORDER — MORPHINE SULFATE (PF) 4 MG/ML IV SOLN
4.0000 mg | INTRAVENOUS | Status: DC | PRN
  Administered 2018-07-23 (×2): 4 mg via INTRAVENOUS
  Filled 2018-07-22 (×2): qty 1

## 2018-07-22 MED ORDER — ONDANSETRON HCL 4 MG/2ML IJ SOLN
4.0000 mg | Freq: Once | INTRAMUSCULAR | Status: AC
  Administered 2018-07-22: 4 mg via INTRAVENOUS
  Filled 2018-07-22: qty 2

## 2018-07-22 MED ORDER — HYDROMORPHONE HCL 1 MG/ML IJ SOLN
1.0000 mg | Freq: Once | INTRAMUSCULAR | Status: AC
  Administered 2018-07-22: 1 mg via INTRAVENOUS
  Filled 2018-07-22: qty 1

## 2018-07-22 MED ORDER — LACTATED RINGERS IV BOLUS
1000.0000 mL | Freq: Once | INTRAVENOUS | Status: AC
  Administered 2018-07-22: 1000 mL via INTRAVENOUS

## 2018-07-22 NOTE — Progress Notes (Signed)
Spoke with pharmacy regarding parenteral dosing for PO afib medications.  They suggested conversion of metoprolol and amiodarone using same dose as oral and 5mg  diltiazem gtt starting tomorrow. Ordered the IV metop and dilt, but amiodarone in low supply. Followed closely by cardiology, consider consulting them tomorrow regarding proper anti-arrhythmic regimen for ths patient while NPO.

## 2018-07-22 NOTE — ED Notes (Signed)
Attempted to call report

## 2018-07-22 NOTE — H&P (Addendum)
[facility] Admission History and Physical Service Pager: [phone]  Patient name: Rachel Park Medical record number: [id number] Date of birth: [birth year] Age: 85 y.o. Gender: female  Primary Care Provider: Lajean Manes, MD Consultants: gen surg Code Status: full  Chief Complaint: nausea/vomiting, epigastric pain  Assessment and Plan: Rachel Park is a 85 y.o. female presenting with one day of nausea and vomiting with epigastric pain concerning for small bowel obstruction . PMH is significant for small bowel obstruction, hernia,  afib, CVA, hypothyroidism, htn, CAD,   Small bowel obstruction - patient with previous history of SBO in April 2018 has a 1 day history of epigastric abdominal pain that worsened during the day with eventual nonbloody emesis. Surgical history includes hysterectomy, and hernia repair. On exam she is very tender to palpation in the epigastric region. RBC, WBC currently normal. Lactic acid initially elevated at 2.5 now 1.2 normal.  VSS.  CT abdomen concerning for internal hernia and ischemic small bowel. ED physician noted the radiologist called him regarding concern for possible bowel ischemia/infarction. Surgery was consulted in the ED and put her on SBO protocol and will see her in the AM. Per surgery ischemic bowel felt to be unlikely due to resolution of abdominal pain with NGT and rapidly downtrending lactic acid as well as lack of acidosis on ABG. Patient received 6mg  morphine x2 and 1mg  dilaudid in the ED. Will keep the patient NPO with NGT to suction.  - admit to inpatient, progressive, Dr. Ardelia Mems attending - gen surg consulted, appreciate recs. Recommending small bowel protocol, no emergent need for surgical intervention at this time but hold anticoagulation.  -  NPO - NGT to suction - D5NS @ 181ml/hr mIVF, may consider reducing rate if showing signs of fluid overload - morphine IV 4mg  q4h PRN - zofran IV for nausea - vitals  per routine - I/O qshift - continuous cardiac monitor and pulse ox - PT/OT eval and treat - hold anticoagulation for possibility of surgery.   Persistent Atrial fibrillation - patient followed closely by [facility], recently saw them in April. Was going to get cardioversion but was in NSR, so amiodarone was started instead.  Failed tikosyn, is on amiodarone 200mg  BID, metoprolol tartrate 25mg  BID, diltiazem 180mg  qdaily, and eliquis.  - metop IV 5mg  q6h - diltiazem gtt 5mg /hr - holding amiodarone d/t shortage.  If pt uncontrolled on dilt and metop can start amiodarone gtt.   - holding eliquis d/t possibility of surgery or bowel infarction.   Hypothyroidism - 52mcg daily synthroid at home.  Currently NPO.  - 67mcg IV synthroid  HTN - taking metoprolol BID at home.  Systolic 431V-400Q. Currently NPO - IV metop 5mg  q6h.   Incidental hepatic hyperattenuation area. Found on CT abd as a 1.6 wedge-shaped area favored to represent a flash hemangioma or THAD. - f/u outpatient with [place]   FEN/GI: NPO, d5NS 142ml/hr Prophylaxis: SCD  Disposition: progressive for dilt gtt  History of Present Illness:  Rachel Park is a 85 y.o. female presenting with epigastric pain, nausea/vomiting for one day  This morning the patient developed epigastric pain.  She has had abdominal pain before in the past d/t her hernias, and thought this pain was similar at first.  She has also had abdominal 'tightness' since her cardiac catheterization a few years ago.  As the day progressed her abdominal pain worsened and she developed nausea.The abdominal pain had a waxing/waning intensity. In the early evening she had nonbloody  emesis shortly after her supper.  She also had one episode of diarrhea today.  Usually she has 3-4 normal stools a day.  She states she has felt 'tired all the time' since her cardiac stent placement, but that she has not felt different otherwise recently before developing this abdominal pain this  morning.  She denies diaphoresis, chest pain, fever/chills, cough, SOB.   In the ED, a CT scan was ordered, which showed possible ischemic small bowel.  Surgery was consulted, assessed pt and was not concerned with ischemia and put her on sbo protocol.  She received morphine 6mg  x 2 and dilaudid 1mg  once for pain.  NGT was placed and she was made npo.    She lives alone, has neighbors who check in on her.  Her father had an MI.  Doesn't drink or smoke cigarettes.   Review Of Systems: Per HPI with the following additions:   Review of Systems  Constitutional: Negative for chills and fever.  HENT: Negative for congestion.   Eyes: Negative for blurred vision.  Respiratory: Negative for cough and sputum production.   Cardiovascular: Negative for chest pain.  Gastrointestinal: Positive for abdominal pain, nausea and vomiting. Negative for heartburn.  Genitourinary: Negative for dysuria.  Neurological: Negative for dizziness, focal weakness and headaches.  Psychiatric/Behavioral: Negative for substance abuse.    Patient Active Problem List   Diagnosis Date Noted  . Atrial fibrillation ([place]) 05/20/2018  . Renal infarct ([place])   . Hyperlipidemia 11/27/2016  . Bilateral inguinal hernia without obstruction or gangrene 03/07/2016  . Incisional hernia, without obstruction or gangrene 03/07/2016  . Persistent atrial fibrillation 06/21/2015  . TIA (transient ischemic attack) 03/24/2015  . CAD (coronary artery disease) 12/26/2014  . COPD (chronic obstructive pulmonary disease) ([place]) 12/26/2014  . Hypothyroidism 12/26/2014  . Cerebral embolism with cerebral infarction 12/25/2014    Past Medical History: Past Medical History:  Diagnosis Date  . Anginal pain ([place])   . Arthritis   . Complication of anesthesia   . Coronary artery disease   . Dyspnea    exertion  . Hypertension   . Hypothyroidism   . PAF (paroxysmal atrial fibrillation) ([place])    on Xarelto for maybe a year, opted to stop  .  PONV (postoperative nausea and vomiting)   . Renal infarction ([place])   . Small bowel obstruction ([place]) 06/2015  . Stroke [facility])    oct 2016  . TIA (transient ischemic attack) 03/24/2015  . Vertigo, benign positional    to be evaluated, intermittent  . Wears glasses     Past Surgical History: Past Surgical History:  Procedure Laterality Date  . ABDOMINAL HYSTERECTOMY    . CARDIAC CATHETERIZATION N/A 12/24/2014   Procedure: Left Heart Cath and Coronary Angiography;  Surgeon: Sherren Mocha, MD;  Location: [facility];  Service: Cardiovascular;  Laterality: N/A;  . CARDIOVERSION N/A 05/22/2018   Procedure: CARDIOVERSION;  Surgeon: Josue Hector, MD;  Location: [facility] ENDOSCOPY;  Service: Cardiovascular;  Laterality: N/A;  . CATARACT EXTRACTION Bilateral   . COLONOSCOPY WITH PROPOFOL N/A 12/01/2013   Procedure: COLONOSCOPY WITH PROPOFOL;  Surgeon: Garlan Fair, MD;  Location: [facility] ENDOSCOPY;  Service: Endoscopy;  Laterality: N/A;  . INCISIONAL HERNIA REPAIR N/A 03/10/2016   Procedure: INCISIONAL HERNIA REPAIR TIMES TWO;  Surgeon: Armandina Gemma, MD;  Location: [facility];  Service: General;  Laterality: N/A;  . INGUINAL HERNIA REPAIR Bilateral 03/10/2016   Procedure: BILATERAL INGUINAL HERNIA REPAIRS;  Surgeon: Armandina Gemma, MD;  Location:  [facility] OR;  Service: General;  Laterality: Bilateral;  . INSERTION OF MESH N/A 03/10/2016   Procedure: INSERTION OF MESH TO BILATERAL GROINS AND ABDOMEN;  Surgeon: Armandina Gemma, MD;  Location: [facility];  Service: General;  Laterality: N/A;  . laparotomy  06/2015  . LEFT HEART CATH AND CORONARY ANGIOGRAPHY N/A 09/27/2016   Procedure: Left Heart Cath and Coronary Angiography;  Surgeon: Sherren Mocha, MD;  Location: [facility];  Service: Cardiovascular;  Laterality: N/A;  . TONSILLECTOMY      Social History: Social History   Tobacco Use  . Smoking status: Never Smoker  . Smokeless tobacco: Never Used  Substance Use Topics  . Alcohol use: Not Currently     Comment: occ.  . Drug use: No   Additional social history: Please also refer to relevant sections of EMR.  Family History: Family History  Problem Relation Age of Onset  . Heart attack Father   . Stroke Mother   . Cancer Sister        breast    Allergies and Medications: Allergies  Allergen Reactions  . Anesthesia S-I-60 Nausea And Vomiting    "with hysterectomy years back had post op N/V"   No current facility-administered medications on file prior to encounter.    Current Outpatient Medications on File Prior to Encounter  Medication Sig Dispense Refill  . acetaminophen (TYLENOL) 500 MG tablet Take 500 mg by mouth every 6 (six) hours as needed for mild pain.     Marland Kitchen amiodarone (PACERONE) 200 MG tablet Take 1 tablet (200 mg total) twice daily for one month.  Then reduce and take 1 tablet daily 180 tablet 2  . apixaban (ELIQUIS) 5 MG TABS tablet Take 1 tablet (5 mg total) by mouth 2 (two) times daily. 60 tablet 3  . CALCIUM PO Take 1 tablet by mouth daily.    . cholecalciferol (VITAMIN D) 1000 UNITS tablet Take 2,000 Units by mouth daily with lunch.     . diltiazem (CARDIZEM CD) 180 MG 24 hr capsule Take 1 capsule (180 mg total) by mouth daily. 30 capsule 2  . levothyroxine (SYNTHROID, LEVOTHROID) 75 MCG tablet Take 75 mcg by mouth daily before breakfast.    . metoprolol tartrate (LOPRESSOR) 25 MG tablet Take 1 tablet (25 mg total) by mouth 2 (two) times daily. 180 tablet 2  . NITROSTAT 0.4 MG SL tablet Place 1 tablet (0.4 mg total) under the tongue every 5 (five) minutes as needed. Chest pain 25 tablet 6  . Polyethyl Glycol-Propyl Glycol (SYSTANE ULTRA) 0.4-0.3 % SOLN Place 1-2 drops into both eyes 3 (three) times daily as needed (dry eyes).    . raNITIdine HCl (ZANTAC PO) Take 1 tablet by mouth daily as needed (heartbrun).    . rosuvastatin (CRESTOR) 10 MG tablet Take 1 tablet (10 mg total) by mouth daily. 90 tablet 2    Objective: BP (!) 143/77 (BP Location: Right Arm)    Pulse 86   Resp 16   SpO2 99%  Exam: General: alert and oriented. Mild discomfort.   Eyes: PERRL. EOMI. No scleral icterus.  ENTM: no oropharyngeal erythema. Moist oral mucosa.   Neck: no thyromegaly or cervical LAD.  Cardiovascular: irregularly irregular rhythm.  Normal rate. No murmurs.  Respiratory: LCTAB bilaterally. No wheezes.  Gastrointestinal: extremely TTP in epigastric region.  Reduced bowel sounds. Soft, no rebound or guarding MSK: 5/5 strength upper and lower extremities bilaterally.   Derm: no rashes. Skin warm and dry.   Neuro:  CN 2-12 grossly intact.   Psych: pleasant affect.    Labs and Imaging: CBC BMET  Recent Labs  Lab 07/22/18 1903  WBC 7.5  HGB 14.2  HCT 42.7  PLT 274   Recent Labs  Lab 07/22/18 1903  NA 134*  K 4.0  CL 99  CO2 20*  BUN 17  CREATININE 0.77  GLUCOSE 152*  CALCIUM 9.9     EKG showing afib  CT abd/pelvis:  1. Dilated loops of small bowel throughout the abdomen with extensive hypoenhancement, fat stranding, and swirling of the mesentery is highly concerning for an internal hernia with ischemic small bowel. Correlation with lactate and surgical consultation is recommended. There is no free air. There is a small amount of free fluid in the upper abdomen. 2. The SMA is widely patent. 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 3. 1.6 cm hyperattenuating wedge-shaped area in hepatic segment 4A, new from prior study. This is favored to represent a flash hemangioma or THAD. Follow-up with nonemergent outpatient ultrasound is recommended. 4. Cardiomegaly with interlobular septal thickening suggestive of developing volume overload. There is a trace right-sided pleural effusion. 5. Cortical thinning of the patient's right kidney consistent with evolving infarcts as demonstrated on prior CT from 04/21/2018. 6. Sigmoid diverticulosis without CT evidence of diverticulitis.   Benay Pike, MD 07/22/2018,  9:50 PM PGY-1, [place] Intern pager: [phone], text pages welcome  FPTS Upper-Level Resident Addendum  I have independently interviewed and examined the patient. I have discussed the above with the original author and agree with their documentation. My edits for correction/addition/clarification are in blue. Please see also any attending notes.   Bufford Lope, DO PGY-3, [place] [facility] 07/23/2018 5:42 AM  FPTS Service pager: [phone] (text pages welcome through [facility])

## 2018-07-22 NOTE — ED Provider Notes (Signed)
[facility] EMERGENCY DEPARTMENT Provider Note   CSN: [id number] Arrival date & time: 07/22/18  1854    History   Chief Complaint Chief Complaint  Patient presents with   Abdominal Pain    HPI JOEL MERICLE is a 85 y.o. female.  HPI 85 year old female with a history of CAD, HTN, hypothyroidism, paroxysmal A. fib on Eliquis, and SBO presents with abdominal pain.  Patient states that her pain started gradually this afternoon and worsened acutely around 6 PM.  Pain described as sharp, constant, nonradiating, and is worse in the epigastrium.  Denies chest pain, shortness of breath, fever, cough, or urinary symptoms.  She had associated nausea and bilious emesis.  She states that the pain feels  similar to prior SBO.  Past Medical History:  Diagnosis Date   Anginal pain ([place])    Arthritis    Complication of anesthesia    Coronary artery disease    Dyspnea    exertion   Hypertension    Hypothyroidism    PAF (paroxysmal atrial fibrillation) ([place])    on Xarelto for maybe a year, opted to stop   PONV (postoperative nausea and vomiting)    Renal infarction ([place])    Small bowel obstruction ([place]) 06/2015   Stroke ([place])    oct 2016   TIA (transient ischemic attack) 03/24/2015   Vertigo, benign positional    to be evaluated, intermittent   Wears glasses     Patient Active Problem List   Diagnosis Date Noted   Small bowel obstruction ([place]) 07/22/2018   Atrial fibrillation ([place]) 05/20/2018   Renal infarct ([place])    Hyperlipidemia 11/27/2016   Bilateral inguinal hernia without obstruction or gangrene 03/07/2016   Incisional hernia, without obstruction or gangrene 03/07/2016   Persistent atrial fibrillation 06/21/2015   TIA (transient ischemic attack) 03/24/2015   CAD (coronary artery disease) 12/26/2014   COPD (chronic obstructive pulmonary disease) ([place]) 12/26/2014   Hypothyroidism 12/26/2014   Cerebral embolism with cerebral  infarction 12/25/2014    Past Surgical History:  Procedure Laterality Date   ABDOMINAL HYSTERECTOMY     CARDIAC CATHETERIZATION N/A 12/24/2014   Procedure: Left Heart Cath and Coronary Angiography;  Surgeon: Sherren Mocha, MD;  Location: [facility];  Service: Cardiovascular;  Laterality: N/A;   CARDIOVERSION N/A 05/22/2018   Procedure: CARDIOVERSION;  Surgeon: Josue Hector, MD;  Location[facility] ENDOSCOPY;  Service: Cardiovascular;  Laterality: N/A;   CATARACT EXTRACTION Bilateral    COLONOSCOPY WITH PROPOFOL N/A 12/01/2013   Procedure: COLONOSCOPY WITH PROPOFOL;  Surgeon: Garlan Fair, MD;  Location: [facility] ENDOSCOPY;  Service: Endoscopy;  Laterality: N/A;   INCISIONAL HERNIA REPAIR N/A 03/10/2016   Procedure: INCISIONAL HERNIA REPAIR TIMES TWO;  Surgeon: Armandina Gemma, MD;  Location: [facility];  Service: General;  Laterality: N/A;   INGUINAL HERNIA REPAIR Bilateral 03/10/2016   Procedure: BILATERAL INGUINAL HERNIA REPAIRS;  Surgeon: Armandina Gemma, MD;  Location: [facility];  Service: General;  Laterality: Bilateral;   INSERTION OF MESH N/A 03/10/2016   Procedure: INSERTION OF MESH TO BILATERAL GROINS AND ABDOMEN;  Surgeon: Armandina Gemma, MD;  Location: [facility];  Service: General;  Laterality: N/A;   laparotomy  06/2015   LEFT HEART CATH AND CORONARY ANGIOGRAPHY N/A 09/27/2016   Procedure: Left Heart Cath and Coronary Angiography;  Surgeon: Sherren Mocha, MD;  Location: [facility];  Service: Cardiovascular;  Laterality: N/A;   TONSILLECTOMY       OB History   No  obstetric history on file.      Home Medications    Prior to Admission medications   Medication Sig Start Date End Date Taking? Authorizing Provider  acetaminophen (TYLENOL) 500 MG tablet Take 500 mg by mouth every 6 (six) hours as needed for mild pain.    Yes [provider]  amiodarone (PACERONE) 200 MG tablet Take 1 tablet (200 mg total) twice daily for one month.  Then reduce and take 1 tablet daily  06/24/18  Yes Nahser, Wonda Cheng, MD  apixaban (ELIQUIS) 5 MG TABS tablet Take 1 tablet (5 mg total) by mouth 2 (two) times daily. 06/24/18  Yes Nahser, Wonda Cheng, MD  CALCIUM PO Take 1 tablet by mouth daily.   Yes [provider]  cholecalciferol (VITAMIN D) 1000 UNITS tablet Take 2,000 Units by mouth daily with lunch.    Yes [provider]  diltiazem (CARDIZEM CD) 180 MG 24 hr capsule Take 1 capsule (180 mg total) by mouth daily. 06/24/18  Yes Nahser, Wonda Cheng, MD  levothyroxine (SYNTHROID, LEVOTHROID) 75 MCG tablet Take 75 mcg by mouth daily before breakfast.   Yes [provider]  metoprolol tartrate (LOPRESSOR) 25 MG tablet Take 1 tablet (25 mg total) by mouth 2 (two) times daily. 06/24/18  Yes Nahser, Wonda Cheng, MD  NITROSTAT 0.4 MG SL tablet Place 1 tablet (0.4 mg total) under the tongue every 5 (five) minutes as needed. Chest pain 06/24/18  Yes Nahser, Wonda Cheng, MD  Polyethyl Glycol-Propyl Glycol (SYSTANE ULTRA) 0.4-0.3 % SOLN Place 1-2 drops into both eyes 3 (three) times daily as needed (dry eyes).   Yes [provider]  raNITIdine HCl (ZANTAC PO) Take 1 tablet by mouth daily as needed (heartbrun).   Yes [provider]  rosuvastatin (CRESTOR) 10 MG tablet Take 1 tablet (10 mg total) by mouth daily. 06/24/18  Yes Nahser, Wonda Cheng, MD    Family History Family History  Problem Relation Age of Onset   Heart attack Father    Stroke Mother    Cancer Sister        breast    Social History Social History   Tobacco Use   Smoking status: Never Smoker   Smokeless tobacco: Never Used  Substance Use Topics   Alcohol use: Not Currently    Comment: occ.   Drug use: No     Allergies   Anesthesia s-i-60   Review of Systems Review of Systems  Constitutional: Negative for chills and fever.  HENT: Negative for ear pain and sore throat.   Eyes: Negative for pain and visual disturbance.  Respiratory: Negative for cough and shortness of  breath.   Cardiovascular: Negative for chest pain and palpitations.  Gastrointestinal: Positive for abdominal pain, nausea and vomiting.  Genitourinary: Negative for dysuria and hematuria.  Musculoskeletal: Negative for arthralgias and back pain.  Skin: Negative for color change and rash.  Neurological: Negative for seizures and syncope.  All other systems reviewed and are negative.     Physical Exam Updated Vital Signs BP (!) 138/93    Pulse 67    Resp 17    SpO2 95%   Physical Exam Vitals signs and nursing note reviewed.  Constitutional:      General: She is not in acute distress.    Appearance: She is well-developed.  HENT:     Head: Normocephalic and atraumatic.  Eyes:     Conjunctiva/sclera: Conjunctivae normal.  Neck:     Musculoskeletal: Neck supple.  Cardiovascular:  Rate and Rhythm: Normal rate and regular rhythm.     Heart sounds: No murmur.  Pulmonary:     Effort: Pulmonary effort is normal. No respiratory distress.     Breath sounds: Normal breath sounds.  Abdominal:     Palpations: Abdomen is soft.     Tenderness: There is generalized abdominal tenderness. There is guarding. There is no rebound.  Skin:    General: Skin is warm and dry.  Neurological:     General: No focal deficit present.     Mental Status: She is alert and oriented to person, place, and time.      ED Treatments / Results  Labs (all labs ordered are listed, but only abnormal results are displayed) Labs Reviewed  COMPREHENSIVE METABOLIC PANEL - Abnormal; Notable for the following components:      Result Value   Sodium 134 (*)    CO2 20 (*)    Glucose, Bld 152 (*)    All other components within normal limits  LACTIC ACID, PLASMA - Abnormal; Notable for the following components:   Lactic Acid, Venous 2.5 (*)    All other components within normal limits  POCT I-STAT 7, (LYTES, BLD GAS, ICA,H+H) - Abnormal; Notable for the following components:   pO2, Arterial 68.0 (*)    Sodium  132 (*)    All other components within normal limits  SARS CORONAVIRUS 2 (HOSPITAL ORDER, [facility])  LIPASE, BLOOD  CBC  LACTIC ACID, PLASMA  LACTIC ACID, PLASMA  URINALYSIS, ROUTINE W REFLEX MICROSCOPIC  BLOOD GAS, ARTERIAL    EKG EKG Interpretation  Date/Time:  Monday Jul 22 2018 19:20:59 EDT Ventricular Rate:  87 PR Interval:    QRS Duration: 84 QT Interval:  387 QTC Calculation: 466 R Axis:   62 Text Interpretation:  Atrial fibrillation Low voltage, precordial leads No acute changes No significant change since last tracing Confirmed by Varney Biles ([id number]) on 07/22/2018 8:38:33 PM   Radiology Ct Abdomen Pelvis W Contrast  Result Date: 07/22/2018 CLINICAL DATA:  Mid abdominal pain. Concern for small bowel obstruction. Nausea and vomiting. EXAM: CT ABDOMEN AND PELVIS WITH CONTRAST TECHNIQUE: Multidetector CT imaging of the abdomen and pelvis was performed using the standard protocol following bolus administration of intravenous contrast. CONTRAST:  110mL OMNIPAQUE IOHEXOL 300 MG/ML  SOLN COMPARISON:  CT dated 04/21/2018. FINDINGS: Lower chest: The heart is significantly enlarged. There is diffuse interlobular septal thickening bilaterally. There is a trace right-sided pleural effusion. There is atelectasis at the lung bases. Hepatobiliary: There is a hyperattenuating 1.6 cm area in hepatic segment 4 a. this is favored to represent a THAD or benign hepatic hemangioma. However, this finding was not well appreciated on the prior CT. The gallbladder is unremarkable. Pancreas: Unremarkable. No pancreatic ductal dilatation or surrounding inflammatory changes. Spleen: Normal in size without focal abnormality. Adrenals/Urinary Tract: There is scattered areas of cortical scarring bilaterally, right worse than left. This likely represents sequela of old remote infarcts as seen on prior CT. The adrenal glands are unremarkable. There is no hydronephrosis. There are no  radiopaque obstructing kidney stones. The urinary bladder is unremarkable. Stomach/Bowel: There is some wall thickening of the gastric antrum. The proximal small bowel is unremarkable. There are multiple hypoenhancing loops of small bowel involving the jejunum and ilium. There is some swirling of the mesentery in the mid abdomen. Air-fluid levels are noted in the small bowel. There is no pneumatosis. Sigmoid diverticulosis is noted. The colon is relatively  decompressed. Vascular/Lymphatic: The SMA is patent. There is moderate narrowing at the origin of the celiac axis. The IMA is patent. Atherosclerotic changes are noted of the abdominal aorta without evidence of an aneurysm. Reproductive: Status post hysterectomy. No adnexal masses. Other: There is a fat containing periumbilical hernia.  Small volume Musculoskeletal: There is a stable compression fracture of the L2 vertebral body. Degenerative changes are noted of the lumbar spine. IMPRESSION: 1. Dilated loops of small bowel throughout the abdomen with extensive hypoenhancement, fat stranding, and swirling of the mesentery is highly concerning for an internal hernia with ischemic small bowel. Correlation with lactate and surgical consultation is recommended. There is no free air. There is a small amount of free fluid in the upper abdomen. 2. The SMA is widely patent. 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 3. 1.6 cm hyperattenuating wedge-shaped area in hepatic segment 4A, new from prior study. This is favored to represent a flash hemangioma or THAD. Follow-up with nonemergent outpatient ultrasound is recommended. 4. Cardiomegaly with interlobular septal thickening suggestive of developing volume overload. There is a trace right-sided pleural effusion. 5. Cortical thinning of the patient's right kidney consistent with evolving infarcts as demonstrated on prior CT from 04/21/2018. 6. Sigmoid diverticulosis without CT evidence of  diverticulitis. These results were called by telephone at the time of interpretation on 07/22/2018 at 9:39 pm to Dr. Trinidad Curet , who verbally acknowledged these results. Electronically Signed   By: Constance Holster M.D.   On: 07/22/2018 21:43   Dg Abd Portable 1v-small Bowel Protocol-position Verification  Result Date: 07/22/2018 CLINICAL DATA:  NG tube placement EXAM: PORTABLE ABDOMEN - 1 VIEW COMPARISON:  None. FINDINGS: The tip of the NG tube projects over the gastric body/fundus. The visualized bowel gas pattern is nonspecific. IMPRESSION: NG tube tip projects over the gastric fundus/body. Electronically Signed   By: Constance Holster M.D.   On: 07/22/2018 22:01    Procedures Procedures (including critical care time)  Medications Ordered in ED Medications  sodium chloride flush (NS) 0.9 % injection 3 mL (3 mLs Intravenous Not Given 07/22/18 1948)  morphine 4 MG/ML injection 4 mg (has no administration in time range)  morphine 4 MG/ML injection 6 mg (6 mg Intravenous Given 07/22/18 1949)  lactated ringers bolus 1,000 mL (0 mLs Intravenous Stopped 07/22/18 2104)  ondansetron (ZOFRAN) injection 4 mg (4 mg Intravenous Given 07/22/18 1949)  morphine 4 MG/ML injection 6 mg (6 mg Intravenous Given 07/22/18 2103)  iohexol (OMNIPAQUE) 300 MG/ML solution 100 mL (100 mLs Intravenous Contrast Given 07/22/18 2047)  HYDROmorphone (DILAUDID) injection 1 mg (1 mg Intravenous Given 07/22/18 2137)  diatrizoate meglumine-sodium (GASTROGRAFIN) 66-10 % solution 90 mL (90 mLs Per NG tube Given 07/22/18 2236)     Initial Impression / Assessment and Plan / ED Course  I have reviewed the triage vital signs and the nursing notes.  Pertinent labs & imaging results that were available during my care of the patient were reviewed by me and considered in my medical decision making (see chart for details).  85 year old female with a history of CAD, HTN, hypothyroidism, paroxysmal A. fib on Eliquis, and SBO presents  with abdominal pain.  Dynamically stable.  Afebrile. Patient has generalized abdominal tenderness on exam with guarding.  Abdomen is soft.  Pain treated with 6 mg of IV morphine x2.  Patient still uncomfortable and given 1 mg of Dilaudid.  Zofran given for nausea.  Bolus of fluids given.  CMP notable  for sodium 134, CO2 20, glucose 152.  CBC unremarkable.  Pace within normal limits.  Lactic acid 2.5.  SARS-CoV-2 negative.  CT of the abdomen pelvis concerning for dilated loops of small bowel with extensive hypoenhancement concerning for an internal hernia with ischemic small bowel.  No free air.  NG tube placed.  General surgery consulted.  No acute surgical intervention indicated.  Patient's lactic acid is improving after fluids. Pain has been improved after NG tube.   Patient admitted to family medicine for further management.  Final Clinical Impressions(s) / ED Diagnoses   Final diagnoses:  Encounter for imaging study to confirm nasogastric (NG) tube placement  Small bowel obstruction [facility])    ED Discharge Orders    None       Trinidad Curet, MD 07/22/18 [place], Ankit, MD 07/23/18 2057

## 2018-07-22 NOTE — Progress Notes (Signed)
Pt re examined  Her abdominal pain is much better after NGT placement Reviewed films with radiology Concern for ischemia and closed loop obstruction but ABG shows no significant metabolic acidosis and lactate 1.2 down from 2.5. She has no rebound,  guarding or signs of peritonitis and normal WBC I suspect this is a SBO from adhesions and less likely closed loop or intestinal ischemia  Would hold anticoagulation for now  Small bowel protocol initiated and will follow films

## 2018-07-22 NOTE — Consult Note (Signed)
Reason for Consult: Abdominal pain Referring Physician: Kathrynn Humble MD  Rachel Park is an 85 y.o. female.  HPI: Asked to see patient at the request of Dr. Kathrynn Humble for sudden onset of severe epigastric abdominal pain that started about 5 hours ago.  The patient has a history of multiple abdominal operations for small bowel obstructions and hernias in the past.  She felt well until earlier in the evening tonight when she developed severe epigastric abdominal pain.  This was associated with nausea and vomiting.  Her last bowel movement was today.  Patient denies any fever or chills.  She was noted to have a mild lactic acidosis and CT scan showed what appeared to be a distal partial small bowel obstruction.  She had no free air or free fluid.  She has multiple medical problems including atrial fibrillation on chronic anticoagulation hypertension and history of vascular disease.  Currently, her pain is in her epigastrium is moderate to severe in intensity and she did get some relief from the morphine she had earlier.  She has a history of multiple abdominal wall hernias and had multiple hernias repair back in 2018 which included bilateral inguinal hernia with mesh and 2 small lower midline ventral hernia.  Past Medical History:  Diagnosis Date  . Anginal pain ([place])   . Arthritis   . Complication of anesthesia   . Coronary artery disease   . Dyspnea    exertion  . Hypertension   . Hypothyroidism   . PAF (paroxysmal atrial fibrillation) ([place])    on Xarelto for maybe a year, opted to stop  . PONV (postoperative nausea and vomiting)   . Renal infarction ([place])   . Small bowel obstruction ([place]) 06/2015  . Stroke [facility])    oct 2016  . TIA (transient ischemic attack) 03/24/2015  . Vertigo, benign positional    to be evaluated, intermittent  . Wears glasses     Past Surgical History:  Procedure Laterality Date  . ABDOMINAL HYSTERECTOMY    . CARDIAC CATHETERIZATION N/A 12/24/2014   Procedure: Left  Heart Cath and Coronary Angiography;  Surgeon: Sherren Mocha, MD;  Location: [facility];  Service: Cardiovascular;  Laterality: N/A;  . CARDIOVERSION N/A 05/22/2018   Procedure: CARDIOVERSION;  Surgeon: Josue Hector, MD;  Location: [facility];  Service: Cardiovascular;  Laterality: N/A;  . CATARACT EXTRACTION Bilateral   . COLONOSCOPY WITH PROPOFOL N/A 12/01/2013   Procedure: COLONOSCOPY WITH PROPOFOL;  Surgeon: Garlan Fair, MD;  Location: [facility] ENDOSCOPY;  Service: Endoscopy;  Laterality: N/A;  . INCISIONAL HERNIA REPAIR N/A 03/10/2016   Procedure: INCISIONAL HERNIA REPAIR TIMES TWO;  Surgeon: Armandina Gemma, MD;  Location: [facility];  Service: General;  Laterality: N/A;  . INGUINAL HERNIA REPAIR Bilateral 03/10/2016   Procedure: BILATERAL INGUINAL HERNIA REPAIRS;  Surgeon: Armandina Gemma, MD;  Location: [facility];  Service: General;  Laterality: Bilateral;  . INSERTION OF MESH N/A 03/10/2016   Procedure: INSERTION OF MESH TO BILATERAL GROINS AND ABDOMEN;  Surgeon: Armandina Gemma, MD;  Location: [facility];  Service: General;  Laterality: N/A;  . laparotomy  06/2015  . LEFT HEART CATH AND CORONARY ANGIOGRAPHY N/A 09/27/2016   Procedure: Left Heart Cath and Coronary Angiography;  Surgeon: Sherren Mocha, MD;  Location: [facility];  Service: Cardiovascular;  Laterality: N/A;  . TONSILLECTOMY      Family History  Problem Relation Age of Onset  . Heart attack Father   . Stroke Mother   . Cancer  Sister        breast    Social History:  reports that she has never smoked. She has never used smokeless tobacco. She reports previous alcohol use. She reports that she does not use drugs.  Allergies:  Allergies  Allergen Reactions  . Anesthesia S-I-60 Nausea And Vomiting    "with hysterectomy years back had post op N/V"    Medications: I have reviewed the patient's current medications.  Results for orders placed or performed during the hospital encounter of 07/22/18 (from the past 48 hour(s))   Lipase, blood     Status: None   Collection Time: 07/22/18  7:03 PM  Result Value Ref Range   Lipase 27 11 - 51 U/L    Comment: Performed at [facility], [place] [address], [place], [place] [address]  Comprehensive metabolic panel     Status: Abnormal   Collection Time: 07/22/18  7:03 PM  Result Value Ref Range   Sodium 134 (L) 135 - 145 mmol/L   Potassium 4.0 3.5 - 5.1 mmol/L   Chloride 99 98 - 111 mmol/L   CO2 20 (L) 22 - 32 mmol/L   Glucose, Bld 152 (H) 70 - 99 mg/dL   BUN 17 8 - 23 mg/dL   Creatinine, Ser 0.77 0.44 - 1.00 mg/dL   Calcium 9.9 8.9 - 10.3 mg/dL   Total Protein 7.4 6.5 - 8.1 g/dL   Albumin 4.3 3.5 - 5.0 g/dL   AST 38 15 - 41 U/L   ALT 42 0 - 44 U/L   Alkaline Phosphatase 104 38 - 126 U/L   Total Bilirubin 0.8 0.3 - 1.2 mg/dL   GFR calc non Af Amer >60 >60 mL/min   GFR calc Af Amer >60 >60 mL/min   Anion gap 15 5 - 15    Comment: Performed at [facility] [address] [place], [place] [address]  CBC     Status: None   Collection Time: 07/22/18  7:03 PM  Result Value Ref Range   WBC 7.5 4.0 - 10.5 K/uL   RBC 4.65 3.87 - 5.11 MIL/uL   Hemoglobin 14.2 12.0 - 15.0 g/dL   HCT 42.7 36.0 - 46.0 %   MCV 91.8 80.0 - 100.0 fL   MCH 30.5 26.0 - 34.0 pg   MCHC 33.3 30.0 - 36.0 g/dL   RDW 14.6 11.5 - 15.5 %   Platelets 274 150 - 400 K/uL   nRBC 0.0 0.0 - 0.2 %    Comment: Performed at [facility], [place] [address], [place], [place] [address]  Lactic acid, plasma     Status: Abnormal   Collection Time: 07/22/18  7:29 PM  Result Value Ref Range   Lactic Acid, Venous 2.5 (HH) 0.5 - 1.9 mmol/L    Comment: CRITICAL RESULT CALLED TO, READ BACK BY AND VERIFIED WITH: S.GRINDSTAFF RN 2016 07/22/2018 MCCORMICK K Performed at [facility], [place] [address], [place], [place] [address]   SARS Coronavirus 2 (CEPHEID - Performed in [facility] hospital lab), Hosp Order     Status: None   Collection Time: 07/22/18  7:52 PM  Result Value Ref Range   SARS  Coronavirus 2 NEGATIVE NEGATIVE    Comment: (NOTE) If result is NEGATIVE SARS-CoV-2 target nucleic acids are NOT DETECTED. The SARS-CoV-2 RNA is generally detectable in upper and lower  respiratory specimens during the acute phase of infection. The lowest  concentration of SARS-CoV-2 viral copies this  assay can detect is 250  copies / mL. A negative result does not preclude SARS-CoV-2 infection  and should not be used as the sole basis for treatment or other  patient management decisions.  A negative result may occur with  improper specimen collection / handling, submission of specimen other  than nasopharyngeal swab, presence of viral mutation(s) within the  areas targeted by this assay, and inadequate number of viral copies  (<250 copies / mL). A negative result must be combined with clinical  observations, patient history, and epidemiological information. If result is POSITIVE SARS-CoV-2 target nucleic acids are DETECTED. The SARS-CoV-2 RNA is generally detectable in upper and lower  respiratory specimens dur ing the acute phase of infection.  Positive  results are indicative of active infection with SARS-CoV-2.  Clinical  correlation with patient history and other diagnostic information is  necessary to determine patient infection status.  Positive results do  not rule out bacterial infection or co-infection with other viruses. If result is PRESUMPTIVE POSTIVE SARS-CoV-2 nucleic acids MAY BE PRESENT.   A presumptive positive result was obtained on the submitted specimen  and confirmed on repeat testing.  While 2019 novel coronavirus  (SARS-CoV-2) nucleic acids may be present in the submitted sample  additional confirmatory testing may be necessary for epidemiological  and / or clinical management purposes  to differentiate between  SARS-CoV-2 and other Sarbecovirus currently known to infect humans.  If clinically indicated additional testing with an alternate test  methodology  [phone]) is advised. The SARS-CoV-2 RNA is generally  detectable in upper and lower respiratory sp ecimens during the acute  phase of infection. The expected result is Negative. Fact Sheet for Patients:  StrictlyIdeas.no Fact Sheet for Healthcare Providers: BankingDealers.co.za This test is not yet approved or cleared by the [place] FDA and has been authorized for detection and/or diagnosis of SARS-CoV-2 by FDA under an Emergency Use Authorization (EUA).  This EUA will remain in effect (meaning this test can be used) for the duration of the COVID-19 declaration under Section 564(b)(1) of the Act, 21 U.S.C. section 360bbb-3(b)(1), unless the authorization is terminated or revoked sooner. Performed at [facility], [place] [address] [place], [place] [address]     No results found.  Review of Systems  Gastrointestinal: Positive for abdominal pain, diarrhea, nausea and vomiting.  All other systems reviewed and are negative.  Blood pressure (!) 143/77, pulse 86, resp. rate 16, SpO2 99 %. Physical Exam  Constitutional: She is oriented to person, place, and time. She appears well-developed.  HENT:  Head: Normocephalic and atraumatic.  Eyes: Pupils are equal, round, and reactive to light. EOM are normal.  Neck: Normal range of motion. Neck supple.  Cardiovascular: Normal rate.  Respiratory: Effort normal.  GI: She exhibits distension. There is abdominal tenderness. There is no rebound and no guarding.    Neurological: She is alert and oriented to person, place, and time.  Skin: Skin is warm and dry.    Assessment/Plan: Abdominal pain with possible distal partial small bowel obstruction without perforation Await final CT reading  Plain films showed no obvious obstruction  Recommend NG tube for decompression.  No acute surgical indication  Will initiate the small bowel protocol  Recommend medical admission due to  complex medical problems  We will follow along while in house       Patient Active Problem List   Diagnosis Date Noted  . Atrial fibrillation ([place]) 05/20/2018  . Renal infarct ([place])   .  Hyperlipidemia 11/27/2016  . Bilateral inguinal hernia without obstruction or gangrene 03/07/2016  . Incisional hernia, without obstruction or gangrene 03/07/2016  . Persistent atrial fibrillation 06/21/2015  . TIA (transient ischemic attack) 03/24/2015  . CAD (coronary artery disease) 12/26/2014  . COPD (chronic obstructive pulmonary disease) ([place]) 12/26/2014  . Hypothyroidism 12/26/2014  . Cerebral embolism with cerebral infarction 12/25/2014    Marcello Moores A Nikhita Mentzel 07/22/2018, 9:38 PM

## 2018-07-22 NOTE — ED Triage Notes (Signed)
Pt reports severe mid abd pain that started today, hx of bowel obstruction. nausea no vomiting.

## 2018-07-22 NOTE — ED Notes (Signed)
Patient transported to CT scan . 

## 2018-07-22 NOTE — ED Notes (Signed)
ED TO INPATIENT HANDOFF REPORT  ED Nurse Name and Phone #:  Clydene Laming [phone]  S Name/Age/Gender Rachel Park 85 y.o. female Room/Bed: 015C/015C  Code Status   Code Status: Prior  Home/SNF/Other Home {Patient oriented to: Person/Time/Place/Situation  Is this baseline? Yes  Triage Complete: Triage complete  Chief Complaint stomach pain  Triage Note Pt reports severe mid abd pain that started today, hx of bowel obstruction. nausea no vomiting.   Allergies Allergies  Allergen Reactions  . Anesthesia S-I-60 Nausea And Vomiting    "with hysterectomy years back had post op N/V"    Level of Care/Admitting Diagnosis ED Disposition    ED Disposition Condition [place]: [place] [[id number]]  Level of Care: Telemetry Medical [104]  Covid Evaluation: Screening Protocol (No Symptoms)  Diagnosis: Small bowel obstruction [facility]) [[id number]]  Admitting Physician: Benay Pike [[id number]]  Attending Physician: Leeanne Rio [phone]  Estimated length of stay: past midnight tomorrow  Certification:: I certify this patient will need inpatient services for at least 2 midnights  PT Class (Do Not Modify): Inpatient [101]  PT Acc Code (Do Not Modify): Private [1]       B Medical/Surgery History Past Medical History:  Diagnosis Date  . Anginal pain ([place])   . Arthritis   . Complication of anesthesia   . Coronary artery disease   . Dyspnea    exertion  . Hypertension   . Hypothyroidism   . PAF (paroxysmal atrial fibrillation) ([place])    on Xarelto for maybe a year, opted to stop  . PONV (postoperative nausea and vomiting)   . Renal infarction ([place])   . Small bowel obstruction ([place]) 06/2015  . Stroke [facility])    oct 2016  . TIA (transient ischemic attack) 03/24/2015  . Vertigo, benign positional    to be evaluated, intermittent  . Wears glasses    Past Surgical History:  Procedure Laterality Date  . ABDOMINAL HYSTERECTOMY    .  CARDIAC CATHETERIZATION N/A 12/24/2014   Procedure: Left Heart Cath and Coronary Angiography;  Surgeon: Sherren Mocha, MD;  Location: [place] CV LAB;  Service: Cardiovascular;  Laterality: N/A;  . CARDIOVERSION N/A 05/22/2018   Procedure: CARDIOVERSION;  Surgeon: Josue Hector, MD;  Location: [facility] ENDOSCOPY;  Service: Cardiovascular;  Laterality: N/A;  . CATARACT EXTRACTION Bilateral   . COLONOSCOPY WITH PROPOFOL N/A 12/01/2013   Procedure: COLONOSCOPY WITH PROPOFOL;  Surgeon: Garlan Fair, MD;  Location: [facility] ENDOSCOPY;  Service: Endoscopy;  Laterality: N/A;  . INCISIONAL HERNIA REPAIR N/A 03/10/2016   Procedure: INCISIONAL HERNIA REPAIR TIMES TWO;  Surgeon: Armandina Gemma, MD;  Location: [place];  Service: General;  Laterality: N/A;  . INGUINAL HERNIA REPAIR Bilateral 03/10/2016   Procedure: BILATERAL INGUINAL HERNIA REPAIRS;  Surgeon: Armandina Gemma, MD;  Location: [place];  Service: General;  Laterality: Bilateral;  . INSERTION OF MESH N/A 03/10/2016   Procedure: INSERTION OF MESH TO BILATERAL GROINS AND ABDOMEN;  Surgeon: Armandina Gemma, MD;  Location: [place];  Service: General;  Laterality: N/A;  . laparotomy  06/2015  . LEFT HEART CATH AND CORONARY ANGIOGRAPHY N/A 09/27/2016   Procedure: Left Heart Cath and Coronary Angiography;  Surgeon: Sherren Mocha, MD;  Location: [place] CV LAB;  Service: Cardiovascular;  Laterality: N/A;  . TONSILLECTOMY       A IV Location/Drains/Wounds Patient Lines/Drains/Airways Status   Active Line/Drains/Airways    Name:   Placement date:   Placement time:  Site:   Days:   Peripheral IV 07/22/18 Left Antecubital   07/22/18    1947    Antecubital   less than 1   NG/OG Tube Nasogastric 18 Fr. Left nare Aucultation   07/22/18    2150    Left nare   less than 1   Incision (Closed) 03/10/16 Abdomen Other (Comment)   03/10/16    0909     864          Intake/Output Last 24 hours No intake or output data in the 24 hours ending 07/22/18 2340  Labs/Imaging Results  for orders placed or performed during the hospital encounter of 07/22/18 (from the past 48 hour(s))  Lipase, blood     Status: None   Collection Time: 07/22/18  7:03 PM  Result Value Ref Range   Lipase 27 11 - 51 U/L    Comment: Performed at [facility], [place] [address], [place], [place] [address]  Comprehensive metabolic panel     Status: Abnormal   Collection Time: 07/22/18  7:03 PM  Result Value Ref Range   Sodium 134 (L) 135 - 145 mmol/L   Potassium 4.0 3.5 - 5.1 mmol/L   Chloride 99 98 - 111 mmol/L   CO2 20 (L) 22 - 32 mmol/L   Glucose, Bld 152 (H) 70 - 99 mg/dL   BUN 17 8 - 23 mg/dL   Creatinine, Ser 0.77 0.44 - 1.00 mg/dL   Calcium 9.9 8.9 - 10.3 mg/dL   Total Protein 7.4 6.5 - 8.1 g/dL   Albumin 4.3 3.5 - 5.0 g/dL   AST 38 15 - 41 U/L   ALT 42 0 - 44 U/L   Alkaline Phosphatase 104 38 - 126 U/L   Total Bilirubin 0.8 0.3 - 1.2 mg/dL   GFR calc non Af Amer >60 >60 mL/min   GFR calc Af Amer >60 >60 mL/min   Anion gap 15 5 - 15    Comment: Performed at [facility] [address] [place], [place] [address]  CBC     Status: None   Collection Time: 07/22/18  7:03 PM  Result Value Ref Range   WBC 7.5 4.0 - 10.5 K/uL   RBC 4.65 3.87 - 5.11 MIL/uL   Hemoglobin 14.2 12.0 - 15.0 g/dL   HCT 42.7 36.0 - 46.0 %   MCV 91.8 80.0 - 100.0 fL   MCH 30.5 26.0 - 34.0 pg   MCHC 33.3 30.0 - 36.0 g/dL   RDW 14.6 11.5 - 15.5 %   Platelets 274 150 - 400 K/uL   nRBC 0.0 0.0 - 0.2 %    Comment: Performed at [facility], [place] [address], [place], [place] [address]  Lactic acid, plasma     Status: Abnormal   Collection Time: 07/22/18  7:29 PM  Result Value Ref Range   Lactic Acid, Venous 2.5 (HH) 0.5 - 1.9 mmol/L    Comment: CRITICAL RESULT CALLED TO, READ BACK BY AND VERIFIED WITH: S.GRINDSTAFF RN 2016 07/22/2018 MCCORMICK K Performed at [facility], [place] [address], [place], [place] [address]   SARS Coronavirus 2 (CEPHEID - Performed in [place] hospital lab),  Hosp Order     Status: None   Collection Time: 07/22/18  7:52 PM  Result Value Ref Range   SARS Coronavirus 2 NEGATIVE NEGATIVE    Comment: (NOTE) If result is NEGATIVE SARS-CoV-2 target nucleic acids are NOT DETECTED. The SARS-CoV-2 RNA is  generally detectable in upper and lower  respiratory specimens during the acute phase of infection. The lowest  concentration of SARS-CoV-2 viral copies this assay can detect is 250  copies / mL. A negative result does not preclude SARS-CoV-2 infection  and should not be used as the sole basis for treatment or other  patient management decisions.  A negative result may occur with  improper specimen collection / handling, submission of specimen other  than nasopharyngeal swab, presence of viral mutation(s) within the  areas targeted by this assay, and inadequate number of viral copies  (<250 copies / mL). A negative result must be combined with clinical  observations, patient history, and epidemiological information. If result is POSITIVE SARS-CoV-2 target nucleic acids are DETECTED. The SARS-CoV-2 RNA is generally detectable in upper and lower  respiratory specimens dur ing the acute phase of infection.  Positive  results are indicative of active infection with SARS-CoV-2.  Clinical  correlation with patient history and other diagnostic information is  necessary to determine patient infection status.  Positive results do  not rule out bacterial infection or co-infection with other viruses. If result is PRESUMPTIVE POSTIVE SARS-CoV-2 nucleic acids MAY BE PRESENT.   A presumptive positive result was obtained on the submitted specimen  and confirmed on repeat testing.  While 2019 novel coronavirus  (SARS-CoV-2) nucleic acids may be present in the submitted sample  additional confirmatory testing may be necessary for epidemiological  and / or clinical management purposes  to differentiate between  SARS-CoV-2 and other Sarbecovirus currently known to  infect humans.  If clinically indicated additional testing with an alternate test  methodology [id number]) is advised. The SARS-CoV-2 RNA is generally  detectable in upper and lower respiratory sp ecimens during the acute  phase of infection. The expected result is Negative. Fact Sheet for Patients:  StrictlyIdeas.no Fact Sheet for Healthcare Providers: BankingDealers.co.za This test is not yet approved or cleared by the [place] FDA and has been authorized for detection and/or diagnosis of SARS-CoV-2 by FDA under an Emergency Use Authorization (EUA).  This EUA will remain in effect (meaning this test can be used) for the duration of the COVID-19 declaration under Section 564(b)(1) of the Act, 21 U.S.C. section 360bbb-3(b)(1), unless the authorization is terminated or revoked sooner. Performed at [facility], [place] [address], [place], [place] [address]   Lactic acid, plasma     Status: None   Collection Time: 07/22/18 10:10 PM  Result Value Ref Range   Lactic Acid, Venous 1.2 0.5 - 1.9 mmol/L    Comment: Performed at [facility] [address], [place], [place] [address]  I-STAT 7, (LYTES, BLD GAS, ICA, H+H)     Status: Abnormal   Collection Time: 07/22/18 11:02 PM  Result Value Ref Range   pH, Arterial 7.360 7.350 - 7.450   pCO2 arterial 41.2 32.0 - 48.0 mmHg   pO2, Arterial 68.0 (L) 83.0 - 108.0 mmHg   Bicarbonate 23.5 20.0 - 28.0 mmol/L   TCO2 25 22 - 32 mmol/L   O2 Saturation 93.0 %   Acid-base deficit 2.0 0.0 - 2.0 mmol/L   Sodium 132 (L) 135 - 145 mmol/L   Potassium 4.4 3.5 - 5.1 mmol/L   Calcium, Ion 1.25 1.15 - 1.40 mmol/L   HCT 39.0 36.0 - 46.0 %   Hemoglobin 13.3 12.0 - 15.0 g/dL   Patient temperature 97.4 F    Collection site RADIAL, ALLEN'S TEST ACCEPTABLE    Drawn by  RT    Sample type ARTERIAL    Ct Abdomen Pelvis W Contrast  Result Date: 07/22/2018 CLINICAL DATA:  Mid abdominal pain. Concern  for small bowel obstruction. Nausea and vomiting. EXAM: CT ABDOMEN AND PELVIS WITH CONTRAST TECHNIQUE: Multidetector CT imaging of the abdomen and pelvis was performed using the standard protocol following bolus administration of intravenous contrast. CONTRAST:  127mL OMNIPAQUE IOHEXOL 300 MG/ML  SOLN COMPARISON:  CT dated 04/21/2018. FINDINGS: Lower chest: The heart is significantly enlarged. There is diffuse interlobular septal thickening bilaterally. There is a trace right-sided pleural effusion. There is atelectasis at the lung bases. Hepatobiliary: There is a hyperattenuating 1.6 cm area in hepatic segment 4 a. this is favored to represent a THAD or benign hepatic hemangioma. However, this finding was not well appreciated on the prior CT. The gallbladder is unremarkable. Pancreas: Unremarkable. No pancreatic ductal dilatation or surrounding inflammatory changes. Spleen: Normal in size without focal abnormality. Adrenals/Urinary Tract: There is scattered areas of cortical scarring bilaterally, right worse than left. This likely represents sequela of old remote infarcts as seen on prior CT. The adrenal glands are unremarkable. There is no hydronephrosis. There are no radiopaque obstructing kidney stones. The urinary bladder is unremarkable. Stomach/Bowel: There is some wall thickening of the gastric antrum. The proximal small bowel is unremarkable. There are multiple hypoenhancing loops of small bowel involving the jejunum and ilium. There is some swirling of the mesentery in the mid abdomen. Air-fluid levels are noted in the small bowel. There is no pneumatosis. Sigmoid diverticulosis is noted. The colon is relatively decompressed. Vascular/Lymphatic: The SMA is patent. There is moderate narrowing at the origin of the celiac axis. The IMA is patent. Atherosclerotic changes are noted of the abdominal aorta without evidence of an aneurysm. Reproductive: Status post hysterectomy. No adnexal masses. Other: There is  a fat containing periumbilical hernia.  Small volume Musculoskeletal: There is a stable compression fracture of the L2 vertebral body. Degenerative changes are noted of the lumbar spine. IMPRESSION: 1. Dilated loops of small bowel throughout the abdomen with extensive hypoenhancement, fat stranding, and swirling of the mesentery is highly concerning for an internal hernia with ischemic small bowel. Correlation with lactate and surgical consultation is recommended. There is no free air. There is a small amount of free fluid in the upper abdomen. 2. The SMA is widely patent. 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 3. 1.6 cm hyperattenuating wedge-shaped area in hepatic segment 4A, new from prior study. This is favored to represent a flash hemangioma or THAD. Follow-up with nonemergent outpatient ultrasound is recommended. 4. Cardiomegaly with interlobular septal thickening suggestive of developing volume overload. There is a trace right-sided pleural effusion. 5. Cortical thinning of the patient's right kidney consistent with evolving infarcts as demonstrated on prior CT from 04/21/2018. 6. Sigmoid diverticulosis without CT evidence of diverticulitis. These results were called by telephone at the time of interpretation on 07/22/2018 at 9:39 pm to Dr. Trinidad Curet , who verbally acknowledged these results. Electronically Signed   By: Constance Holster M.D.   On: 07/22/2018 21:43   Dg Abd Portable 1v-small Bowel Protocol-position Verification  Result Date: 07/22/2018 CLINICAL DATA:  NG tube placement EXAM: PORTABLE ABDOMEN - 1 VIEW COMPARISON:  None. FINDINGS: The tip of the NG tube projects over the gastric body/fundus. The visualized bowel gas pattern is nonspecific. IMPRESSION: NG tube tip projects over the gastric fundus/body. Electronically Signed   By: Constance Holster M.D.   On:  07/22/2018 22:01    Pending Labs Unresulted Labs (From admission, onward)    Start     Ordered    07/22/18 2159  Blood gas, arterial  Once,   R    Question:  Room air or oxygen  Answer:  Room air   07/22/18 2159   07/22/18 1929  Lactic acid, plasma  STAT Now then every 3 hours,   R     07/22/18 1928   07/22/18 1903  Urinalysis, Routine w reflex microscopic  ONCE - STAT,   STAT     07/22/18 1903   Signed and Held  Basic metabolic panel  Tomorrow morning,   R     Signed and Held   Signed and Held  CBC  Tomorrow morning,   R     Signed and Held   Signed and Held  Protime-INR  Tomorrow morning,   R     Signed and Held   Signed and Held  APTT  Tomorrow morning,   R     Signed and Held   Signed and Held  Hemoglobin A1c  Tomorrow morning,   R     Signed and Held          Vitals/Pain Today's Vitals   07/22/18 2230 07/22/18 2242 07/22/18 2325 07/22/18 2332  BP: (!) 127/59   137/82  Pulse: 97   77  Resp: 17   16  SpO2: 96%   98%  PainSc:  0-No pain 0-No pain 0-No pain    Isolation Precautions No active isolations  Medications Medications  sodium chloride flush (NS) 0.9 % injection 3 mL (3 mLs Intravenous Not Given 07/22/18 1948)  morphine 4 MG/ML injection 4 mg (has no administration in time range)  morphine 4 MG/ML injection 6 mg (6 mg Intravenous Given 07/22/18 1949)  lactated ringers bolus 1,000 mL (0 mLs Intravenous Stopped 07/22/18 2104)  ondansetron (ZOFRAN) injection 4 mg (4 mg Intravenous Given 07/22/18 1949)  morphine 4 MG/ML injection 6 mg (6 mg Intravenous Given 07/22/18 2103)  iohexol (OMNIPAQUE) 300 MG/ML solution 100 mL (100 mLs Intravenous Contrast Given 07/22/18 2047)  HYDROmorphone (DILAUDID) injection 1 mg (1 mg Intravenous Given 07/22/18 2137)  diatrizoate meglumine-sodium (GASTROGRAFIN) 66-10 % solution 90 mL (90 mLs Per NG tube Given 07/22/18 2236)    Mobility walks     Focused Assessments Family notified on pt.'s admission/plan of care.   R Recommendations: See Admitting Provider Note  Report given to:   Additional Notes:

## 2018-07-22 NOTE — ED Notes (Signed)
Radiology tech notified that pt. received her Gastrografin per NGT at 2240.

## 2018-07-23 ENCOUNTER — Inpatient Hospital Stay (HOSPITAL_COMMUNITY): Payer: Medicare HMO

## 2018-07-23 ENCOUNTER — Inpatient Hospital Stay (HOSPITAL_COMMUNITY): Payer: Medicare HMO | Admitting: Anesthesiology

## 2018-07-23 ENCOUNTER — Encounter (HOSPITAL_COMMUNITY)
Admission: EM | Disposition: A | Discharge status: Home Health | Payer: Self-pay | Source: Home / Self Care | Attending: Family Medicine

## 2018-07-23 ENCOUNTER — Inpatient Hospital Stay: Payer: Self-pay

## 2018-07-23 ENCOUNTER — Encounter (HOSPITAL_COMMUNITY): Payer: Self-pay

## 2018-07-23 DIAGNOSIS — K56609 Unspecified intestinal obstruction, unspecified as to partial versus complete obstruction: Secondary | ICD-10-CM

## 2018-07-23 DIAGNOSIS — Z9911 Dependence on respirator [ventilator] status: Secondary | ICD-10-CM

## 2018-07-23 DIAGNOSIS — J9601 Acute respiratory failure with hypoxia: Secondary | ICD-10-CM

## 2018-07-23 DIAGNOSIS — R579 Shock, unspecified: Secondary | ICD-10-CM

## 2018-07-23 HISTORY — PX: LAPAROTOMY: SHX154

## 2018-07-23 HISTORY — PX: APPLICATION OF WOUND VAC: SHX5189

## 2018-07-23 HISTORY — PX: BOWEL RESECTION: SHX1257

## 2018-07-23 LAB — CBC
HCT: 45.1 % (ref 36.0–46.0)
HCT: 43.3 % (ref 36.0–46.0)
Hemoglobin: 15.5 g/dL — ABNORMAL HIGH (ref 12.0–15.0)
Hemoglobin: 14.6 g/dL (ref 12.0–15.0)
MCH: 30.8 pg (ref 26.0–34.0)
MCH: 31.1 pg (ref 26.0–34.0)
MCHC: 34.4 g/dL (ref 30.0–36.0)
MCHC: 33.7 g/dL (ref 30.0–36.0)
MCV: 89.5 fL (ref 80.0–100.0)
MCV: 92.1 fL (ref 80.0–100.0)
Platelets: 270 10*3/uL (ref 150–400)
Platelets: 231 10*3/uL (ref 150–400)
RBC: 5.04 MIL/uL (ref 3.87–5.11)
RBC: 4.7 MIL/uL (ref 3.87–5.11)
RDW: 14.6 % (ref 11.5–15.5)
RDW: 14.6 % (ref 11.5–15.5)
WBC: 10.9 10*3/uL — ABNORMAL HIGH (ref 4.0–10.5)
WBC: 15.4 10*3/uL — ABNORMAL HIGH (ref 4.0–10.5)
nRBC: 0 % (ref 0.0–0.2)
nRBC: 0 % (ref 0.0–0.2)

## 2018-07-23 LAB — BASIC METABOLIC PANEL
Anion gap: 13 (ref 5–15)
Anion gap: 8 (ref 5–15)
BUN: 16 mg/dL (ref 8–23)
BUN: 20 mg/dL (ref 8–23)
CO2: 20 mmol/L — ABNORMAL LOW (ref 22–32)
CO2: 19 mmol/L — ABNORMAL LOW (ref 22–32)
Calcium: 9.6 mg/dL (ref 8.9–10.3)
Calcium: 7.9 mg/dL — ABNORMAL LOW (ref 8.9–10.3)
Chloride: 100 mmol/L (ref 98–111)
Chloride: 108 mmol/L (ref 98–111)
Creatinine, Ser: 0.74 mg/dL (ref 0.44–1.00)
Creatinine, Ser: 0.86 mg/dL (ref 0.44–1.00)
GFR calc Af Amer: >60 mL/min (ref >60)
GFR calc Af Amer: >60 mL/min (ref >60)
GFR calc non Af Amer: >60 mL/min (ref >60)
GFR calc non Af Amer: >60 mL/min (ref >60)
Glucose, Bld: 160 mg/dL — ABNORMAL HIGH (ref 70–99)
Glucose, Bld: 160 mg/dL — ABNORMAL HIGH (ref 70–99)
Potassium: 5 mmol/L (ref 3.5–5.1)
Potassium: 4.2 mmol/L (ref 3.5–5.1)
Sodium: 133 mmol/L — ABNORMAL LOW (ref 135–145)
Sodium: 135 mmol/L (ref 135–145)

## 2018-07-23 LAB — HEMOGLOBIN A1C
Hgb A1c MFr Bld: 5.8 % — ABNORMAL HIGH (ref 4.8–5.6)
Mean Plasma Glucose: 119.76 mg/dL

## 2018-07-23 LAB — POCT I-STAT 7, (LYTES, BLD GAS, ICA,H+H)
Acid-base deficit: 8 mmol/L — ABNORMAL HIGH (ref 0.0–2.0)
Acid-base deficit: 8 mmol/L — ABNORMAL HIGH (ref 0.0–2.0)
Bicarbonate: 19.1 mmol/L — ABNORMAL LOW (ref 20.0–28.0)
Bicarbonate: 17 mmol/L — ABNORMAL LOW (ref 20.0–28.0)
Calcium, Ion: 1.17 mmol/L (ref 1.15–1.40)
Calcium, Ion: 1.12 mmol/L — ABNORMAL LOW (ref 1.15–1.40)
HCT: 48 % — ABNORMAL HIGH (ref 36.0–46.0)
HCT: 41 % (ref 36.0–46.0)
Hemoglobin: 16.3 g/dL — ABNORMAL HIGH (ref 12.0–15.0)
Hemoglobin: 13.9 g/dL (ref 12.0–15.0)
O2 Saturation: 100 %
O2 Saturation: 99 %
Potassium: 4.8 mmol/L (ref 3.5–5.1)
Potassium: 4.1 mmol/L (ref 3.5–5.1)
Sodium: 135 mmol/L (ref 135–145)
Sodium: 137 mmol/L (ref 135–145)
TCO2: 20 mmol/L — ABNORMAL LOW (ref 22–32)
TCO2: 18 mmol/L — ABNORMAL LOW (ref 22–32)
pCO2 arterial: 43.6 mmHg (ref 32.0–48.0)
pCO2 arterial: 31.9 mmHg — ABNORMAL LOW (ref 32.0–48.0)
pH, Arterial: 7.25 — ABNORMAL LOW (ref 7.350–7.450)
pH, Arterial: 7.334 — ABNORMAL LOW (ref 7.350–7.450)
pO2, Arterial: 341 mmHg — ABNORMAL HIGH (ref 83.0–108.0)
pO2, Arterial: 132 mmHg — ABNORMAL HIGH (ref 83.0–108.0)

## 2018-07-23 LAB — APTT: aPTT: 27 seconds (ref 24–36)

## 2018-07-23 LAB — SURGICAL PCR SCREEN
MRSA, PCR: NEGATIVE
Staphylococcus aureus: NEGATIVE

## 2018-07-23 LAB — PROTIME-INR
INR: 1.1 (ref 0.8–1.2)
Prothrombin Time: 14.3 seconds (ref 11.4–15.2)

## 2018-07-23 SURGERY — LAPAROTOMY, EXPLORATORY
Anesthesia: General | Site: Abdomen

## 2018-07-23 MED ORDER — ONDANSETRON HCL 4 MG/2ML IJ SOLN
4.0000 mg | Freq: Four times a day (QID) | INTRAMUSCULAR | Status: DC | PRN
  Administered 2018-07-31 – 2018-08-01 (×3): 4 mg via INTRAVENOUS
  Filled 2018-07-23 (×3): qty 2

## 2018-07-23 MED ORDER — ONDANSETRON 4 MG PO TBDP
4.0000 mg | ORAL_TABLET | Freq: Four times a day (QID) | ORAL | Status: DC | PRN
  Administered 2018-07-30 – 2018-08-01 (×3): 4 mg via ORAL
  Filled 2018-07-23 (×3): qty 1

## 2018-07-23 MED ORDER — LEVOTHYROXINE SODIUM 100 MCG/5ML IV SOLN
40.0000 ug | Freq: Every day | INTRAVENOUS | Status: DC
  Administered 2018-07-24 – 2018-07-29 (×6): 40 ug via INTRAVENOUS
  Filled 2018-07-23 (×7): qty 5

## 2018-07-23 MED ORDER — FENTANYL CITRATE (PF) 250 MCG/5ML IJ SOLN
INTRAMUSCULAR | Status: AC
  Filled 2018-07-23: qty 5

## 2018-07-23 MED ORDER — DEXTROSE-NACL 5-0.9 % IV SOLN
INTRAVENOUS | Status: DC
  Administered 2018-07-23: 03:00:00 via INTRAVENOUS

## 2018-07-23 MED ORDER — ENOXAPARIN SODIUM 40 MG/0.4ML ~~LOC~~ SOLN
40.0000 mg | SUBCUTANEOUS | Status: DC
  Administered 2018-07-24 – 2018-07-31 (×8): 40 mg via SUBCUTANEOUS
  Filled 2018-07-23 (×8): qty 0.4

## 2018-07-23 MED ORDER — SODIUM CHLORIDE 0.9% FLUSH
10.0000 mL | Freq: Two times a day (BID) | INTRAVENOUS | Status: DC
  Administered 2018-07-23 – 2018-07-26 (×5): 10 mL
  Administered 2018-07-26: 40 mL
  Administered 2018-07-27: 10 mL
  Administered 2018-07-28: 10:00:00 40 mL
  Administered 2018-07-28 – 2018-07-30 (×4): 10 mL
  Administered 2018-08-04: 20 mL
  Administered 2018-08-05 – 2018-08-09 (×4): 10 mL

## 2018-07-23 MED ORDER — CHLORHEXIDINE GLUCONATE 0.12% ORAL RINSE (MEDLINE KIT)
15.0000 mL | Freq: Two times a day (BID) | OROMUCOSAL | Status: DC
  Administered 2018-07-23 – 2018-07-26 (×6): 15 mL via OROMUCOSAL

## 2018-07-23 MED ORDER — LIDOCAINE 2% (20 MG/ML) 5 ML SYRINGE
INTRAMUSCULAR | Status: DC | PRN
  Administered 2018-07-23: 60 mg via INTRAVENOUS

## 2018-07-23 MED ORDER — ETOMIDATE 2 MG/ML IV SOLN
INTRAVENOUS | Status: DC | PRN
  Administered 2018-07-23: 10 mg via INTRAVENOUS

## 2018-07-23 MED ORDER — PROPOFOL 1000 MG/100ML IV EMUL
0.0000 ug/kg/min | INTRAVENOUS | Status: DC
  Administered 2018-07-23: 45 ug/kg/min via INTRAVENOUS
  Administered 2018-07-23: 50 ug/kg/min via INTRAVENOUS
  Administered 2018-07-23 – 2018-07-24 (×3): 30 ug/kg/min via INTRAVENOUS
  Administered 2018-07-24: 25 ug/kg/min via INTRAVENOUS
  Administered 2018-07-25: 20:00:00 15 ug/kg/min via INTRAVENOUS
  Administered 2018-07-25: 50 ug/kg/min via INTRAVENOUS
  Administered 2018-07-25: 08:00:00 15 ug/kg/min via INTRAVENOUS
  Filled 2018-07-23 (×3): qty 100
  Filled 2018-07-23: qty 200
  Filled 2018-07-23 (×3): qty 100

## 2018-07-23 MED ORDER — ALBUMIN HUMAN 5 % IV SOLN
INTRAVENOUS | Status: DC | PRN
  Administered 2018-07-23 (×2): via INTRAVENOUS

## 2018-07-23 MED ORDER — SODIUM CHLORIDE 0.9 % IV SOLN
1.0000 g | Freq: Once | INTRAVENOUS | Status: AC
  Administered 2018-07-23: 1 g via INTRAVENOUS
  Filled 2018-07-23 (×2): qty 1

## 2018-07-23 MED ORDER — VASOPRESSIN 20 UNIT/ML IV SOLN
0.0100 [IU]/min | INTRAVENOUS | Status: DC
  Filled 2018-07-23: qty 2

## 2018-07-23 MED ORDER — LACTATED RINGERS IV SOLN
INTRAVENOUS | Status: DC | PRN
  Administered 2018-07-23 (×2): via INTRAVENOUS

## 2018-07-23 MED ORDER — FENTANYL CITRATE (PF) 100 MCG/2ML IJ SOLN
25.0000 ug | INTRAMUSCULAR | Status: DC | PRN
  Administered 2018-07-24: 50 ug via INTRAVENOUS
  Administered 2018-07-24: 100 ug via INTRAVENOUS
  Administered 2018-07-25 – 2018-07-26 (×13): 50 ug via INTRAVENOUS
  Filled 2018-07-23 (×10): qty 2

## 2018-07-23 MED ORDER — DEXAMETHASONE SODIUM PHOSPHATE 10 MG/ML IJ SOLN
INTRAMUSCULAR | Status: DC | PRN
  Administered 2018-07-23: 5 mg via INTRAVENOUS

## 2018-07-23 MED ORDER — ROCURONIUM BROMIDE 50 MG/5ML IV SOSY
PREFILLED_SYRINGE | INTRAVENOUS | Status: DC | PRN
  Administered 2018-07-23: 50 mg via INTRAVENOUS

## 2018-07-23 MED ORDER — HYDROMORPHONE HCL 1 MG/ML IJ SOLN
1.0000 mg | Freq: Once | INTRAMUSCULAR | Status: AC
  Administered 2018-07-23: 05:00:00 1 mg via INTRAVENOUS
  Filled 2018-07-23: qty 1

## 2018-07-23 MED ORDER — NOREPINEPHRINE 4 MG/250ML-% IV SOLN
0.0000 ug/min | INTRAVENOUS | Status: DC
  Filled 2018-07-23: qty 250

## 2018-07-23 MED ORDER — PHENYLEPHRINE 40 MCG/ML (10ML) SYRINGE FOR IV PUSH (FOR BLOOD PRESSURE SUPPORT)
PREFILLED_SYRINGE | INTRAVENOUS | Status: DC | PRN
  Administered 2018-07-23: 120 ug via INTRAVENOUS
  Administered 2018-07-23: 160 ug via INTRAVENOUS
  Administered 2018-07-23: 120 ug via INTRAVENOUS
  Administered 2018-07-23: 80 ug via INTRAVENOUS

## 2018-07-23 MED ORDER — SODIUM CHLORIDE 0.9% FLUSH
10.0000 mL | INTRAVENOUS | Status: DC | PRN
  Administered 2018-08-05 – 2018-08-06 (×2): 10 mL
  Filled 2018-07-23 (×2): qty 40

## 2018-07-23 MED ORDER — ORAL CARE MOUTH RINSE
15.0000 mL | OROMUCOSAL | Status: DC
  Administered 2018-07-23 – 2018-07-26 (×29): 15 mL via OROMUCOSAL

## 2018-07-23 MED ORDER — SUCCINYLCHOLINE CHLORIDE 20 MG/ML IJ SOLN
INTRAMUSCULAR | Status: DC | PRN
  Administered 2018-07-23: 80 mg via INTRAVENOUS

## 2018-07-23 MED ORDER — METOPROLOL TARTRATE 5 MG/5ML IV SOLN
5.0000 mg | Freq: Four times a day (QID) | INTRAVENOUS | Status: DC
  Administered 2018-07-23: 5 mg via INTRAVENOUS
  Filled 2018-07-23 (×2): qty 5

## 2018-07-23 MED ORDER — 0.9 % SODIUM CHLORIDE (POUR BTL) OPTIME
TOPICAL | Status: DC | PRN
  Administered 2018-07-23 (×3): 1000 mL

## 2018-07-23 MED ORDER — DILTIAZEM HCL-DEXTROSE 100-5 MG/100ML-% IV SOLN (PREMIX)
5.0000 mg/h | INTRAVENOUS | Status: DC
  Filled 2018-07-23: qty 100

## 2018-07-23 MED ORDER — MUPIROCIN 2 % EX OINT
1.0000 "application " | TOPICAL_OINTMENT | Freq: Two times a day (BID) | CUTANEOUS | Status: AC
  Administered 2018-07-24 – 2018-07-27 (×7): 1 via NASAL
  Filled 2018-07-23 (×2): qty 22

## 2018-07-23 MED ORDER — FENTANYL CITRATE (PF) 100 MCG/2ML IJ SOLN
INTRAMUSCULAR | Status: DC | PRN
  Administered 2018-07-23 (×2): 50 ug via INTRAVENOUS

## 2018-07-23 MED ORDER — PROPOFOL 10 MG/ML IV BOLUS
INTRAVENOUS | Status: AC
  Filled 2018-07-23: qty 20

## 2018-07-23 MED ORDER — CHLORHEXIDINE GLUCONATE CLOTH 2 % EX PADS
6.0000 | MEDICATED_PAD | Freq: Every day | CUTANEOUS | Status: DC
  Administered 2018-07-23 – 2018-08-12 (×20): 6 via TOPICAL

## 2018-07-23 MED ORDER — FENTANYL CITRATE (PF) 100 MCG/2ML IJ SOLN
25.0000 ug | INTRAMUSCULAR | Status: AC | PRN
  Administered 2018-07-23 – 2018-07-25 (×3): 25 ug via INTRAVENOUS
  Filled 2018-07-23 (×3): qty 2

## 2018-07-23 MED ORDER — ACETAMINOPHEN 10 MG/ML IV SOLN
1000.0000 mg | Freq: Four times a day (QID) | INTRAVENOUS | Status: AC
  Administered 2018-07-23 – 2018-07-24 (×4): 1000 mg via INTRAVENOUS
  Filled 2018-07-23 (×4): qty 100

## 2018-07-23 MED ORDER — PANTOPRAZOLE SODIUM 40 MG IV SOLR
40.0000 mg | Freq: Every day | INTRAVENOUS | Status: DC
  Administered 2018-07-23 – 2018-07-29 (×7): 40 mg via INTRAVENOUS
  Filled 2018-07-23 (×7): qty 40

## 2018-07-23 MED ORDER — PROPOFOL 500 MG/50ML IV EMUL
INTRAVENOUS | Status: DC | PRN
  Administered 2018-07-23: 50 ug/kg/min via INTRAVENOUS

## 2018-07-23 MED ORDER — ONDANSETRON HCL 4 MG/2ML IJ SOLN
INTRAMUSCULAR | Status: DC | PRN
  Administered 2018-07-23: 4 mg via INTRAVENOUS

## 2018-07-23 MED ORDER — NOREPINEPHRINE 4 MG/250ML-% IV SOLN: 0.0000 ug/min | INTRAVENOUS | Status: DC

## 2018-07-23 MED ORDER — SODIUM CHLORIDE 0.9 % IV SOLN
INTRAVENOUS | Status: AC
  Administered 2018-07-23 – 2018-07-26 (×6): via INTRAVENOUS

## 2018-07-23 MED ORDER — SODIUM CHLORIDE 0.9 % IV SOLN
INTRAVENOUS | Status: DC | PRN
  Administered 2018-07-23: 50 ug/min via INTRAVENOUS

## 2018-07-23 SURGICAL SUPPLY — 48 items
BLADE CLIPPER SURG (BLADE) ×1 IMPLANT
CANISTER SUCT 3000ML PPV (MISCELLANEOUS) ×2 IMPLANT
CANISTER WOUNDNEG PRESSURE 500 (CANNISTER) ×1 IMPLANT
CHLORAPREP W/TINT 26ML (MISCELLANEOUS) ×2 IMPLANT
COVER SURGICAL LIGHT HANDLE (MISCELLANEOUS) ×2 IMPLANT
COVER WAND RF STERILE (DRAPES) ×2 IMPLANT
DRAPE LAPAROSCOPIC ABDOMINAL (DRAPES) ×2 IMPLANT
DRAPE WARM FLUID 44X44 (DRAPE) ×2 IMPLANT
DRSG OPSITE POSTOP 4X10 (GAUZE/BANDAGES/DRESSINGS) IMPLANT
DRSG OPSITE POSTOP 4X8 (GAUZE/BANDAGES/DRESSINGS) IMPLANT
ELECT BLADE 6.5 EXT (BLADE) ×1 IMPLANT
ELECT CAUTERY BLADE 6.4 (BLADE) IMPLANT
ELECT REM PT RETURN 9FT ADLT (ELECTROSURGICAL) ×2
ELECTRODE REM PT RTRN 9FT ADLT (ELECTROSURGICAL) ×1 IMPLANT
GLOVE BIO SURGEON STRL SZ7 (GLOVE) ×2 IMPLANT
GLOVE BIOGEL PI IND STRL 7.5 (GLOVE) ×1 IMPLANT
GLOVE BIOGEL PI INDICATOR 7.5 (GLOVE) ×1
GOWN STRL REUS W/ TWL LRG LVL3 (GOWN DISPOSABLE) ×2 IMPLANT
GOWN STRL REUS W/TWL LRG LVL3 (GOWN DISPOSABLE) ×4
KIT BASIN OR (CUSTOM PROCEDURE TRAY) ×2 IMPLANT
KIT TURNOVER KIT B (KITS) ×2 IMPLANT
LIGASURE IMPACT 36 18CM CVD LR (INSTRUMENTS) ×1 IMPLANT
MARKER SKIN DUAL TIP RULER LAB (MISCELLANEOUS) ×1 IMPLANT
NS IRRIG 1000ML POUR BTL (IV SOLUTION) ×4 IMPLANT
PACK GENERAL/GYN (CUSTOM PROCEDURE TRAY) ×2 IMPLANT
PAD ARMBOARD 7.5X6 YLW CONV (MISCELLANEOUS) ×2 IMPLANT
PENCIL SMOKE EVACUATOR (MISCELLANEOUS) ×2 IMPLANT
RELOAD PROXIMATE 75MM BLUE (ENDOMECHANICALS) ×2 IMPLANT
RELOAD STAPLE 75 3.8 BLU REG (ENDOMECHANICALS) IMPLANT
SLEEVE SUCTION 125 (MISCELLANEOUS) ×1 IMPLANT
SPECIMEN JAR LARGE (MISCELLANEOUS) IMPLANT
SPONGE ABD ABTHERA ADVANCE (MISCELLANEOUS) ×1 IMPLANT
SPONGE LAP 18X18 RF (DISPOSABLE) IMPLANT
STAPLER PROXIMATE 75MM BLUE (STAPLE) ×1 IMPLANT
STAPLER VISISTAT 35W (STAPLE) ×2 IMPLANT
SUCTION POOLE TIP (SUCTIONS) ×2 IMPLANT
SUT PDS AB 1 TP1 96 (SUTURE) ×2 IMPLANT
SUT SILK 2 0 (SUTURE) ×2
SUT SILK 2 0 SH CR/8 (SUTURE) ×2 IMPLANT
SUT SILK 2-0 18XBRD TIE 12 (SUTURE) ×1 IMPLANT
SUT SILK 3 0 (SUTURE) ×2
SUT SILK 3 0 SH CR/8 (SUTURE) ×2 IMPLANT
SUT SILK 3-0 18XBRD TIE 12 (SUTURE) ×1 IMPLANT
SUT VIC AB 3-0 SH 27 (SUTURE)
SUT VIC AB 3-0 SH 27X BRD (SUTURE) IMPLANT
TOWEL OR 17X26 10 PK STRL BLUE (TOWEL DISPOSABLE) ×2 IMPLANT
TRAY FOLEY MTR SLVR 16FR STAT (SET/KITS/TRAYS/PACK) ×2 IMPLANT
YANKAUER SUCT BULB TIP NO VENT (SUCTIONS) ×1 IMPLANT

## 2018-07-23 NOTE — Op Note (Signed)
Preoperative diagnosis: sbo Postoperative diagnosis: small bowel obstruction, ischemic bowel Procedure: 1. Exploratory laparotomy, lysis of adhesions 2. Small bowel resection 3. Placement of vac for open abdomen Surgeon: Dr Serita Grammes Asst: Dr Georganna Skeans Anesthesia: General Estimated blood loss: 50 cc Complications: None Drains: None Sponge needle count was correct at completion Disposition to ICU in critical condition  Indications: This is an 85 year old female with multiple prior surgeries who presents with a small bowel obstruction.  She initially had a period of nasogastric tube drainage that she felt somewhat improved.  Her CT scan was concerning for an internal hernia with swirling of her mesentery.  When I evaluated her this morning for the first time she had peritoneal signs and clearly needed to proceed to the operating room.  Procedure: After informed consent was obtained she was taken to the operating room.  She was placed on Neo-Synephrine prior to surgery.  She had an arterial line placed.  She was then placed under general anesthesia without complication.  She had a central line placed by anesthesia.  She was prepped and draped in the standard sterile surgical fashion.  Surgical timeout was then performed.  I used her old midline incision.  I started superiorly and track down below the umbilicus.  I was able to enter into her abdomen superiorly where there was no mesh.  This was done without any injury.  There was a lot of bloody ascites when I entered into the abdominal cavity.  I then was able to open her incision in its entirety lysing adhesions the entire time.  I removed both pieces of her old Bard ventral Lex mesh that had been placed at her umbilicus in the inferior abdomen.  I then was able to explore her abdomen.  Her colon, stomach were all both healthy.  Her liver was adherent to her anterior abdominal wall and I used cautery to take down this adhesion.  I then was  able to view her small bowel.  She had an internal hernia with a closed loop obstruction.  I was able to reduce this.  She had 140 cm of small bowel that was completely necrotic.  I was the GIA stapler to divide this on either end.  I then used the LigaSure device as well as sutures to divide the mesentery in its entirety.  This was then passed off the table.  I measured the remaining bowel and she has about 36 cm of terminal ileum left before we could get to her ileocecal valve.  She has overall 190 cm of bowel proximal prior to the ligament of Treitz.  Some of the bowel on either end is dusky.  I then irrigated copiously.  I elected to place a VAC to reexplore in 48 hours to look for any additional ischemia.  The VAC was placed and functional upon completion.  She will be transferred to the ICU in critical condition intubated.

## 2018-07-23 NOTE — Anesthesia Procedure Notes (Signed)
Central Venous Catheter Insertion Performed by: Suzette Battiest, MD, anesthesiologist Start/End5/19/2020 9:45 AM, 07/23/2018 9:55 AM Patient location: Pre-op. Preanesthetic checklist: patient identified, IV checked, site marked, risks and benefits discussed, surgical consent, monitors and equipment checked, pre-op evaluation, timeout performed and anesthesia consent Position: Trendelenburg Lidocaine 1% used for infiltration and patient sedated Hand hygiene performed , maximum sterile barriers used  and Seldinger technique used Catheter size: 8 Fr Total catheter length 16. Central line was placed.Double lumen Procedure performed using ultrasound guided technique. Ultrasound Notes:anatomy identified, needle tip was noted to be adjacent to the nerve/plexus identified, no ultrasound evidence of intravascular and/or intraneural injection and image(s) printed for medical record Attempts: 1 Following insertion, dressing applied, line sutured and Biopatch. Post procedure assessment: blood return through all ports  Patient tolerated the procedure well with no immediate complications.

## 2018-07-23 NOTE — Progress Notes (Signed)
Peripherally Inserted Central Catheter/Midline Placement  The IV Nurse has discussed with the patient and/or persons authorized to consent for the patient, the purpose of this procedure and the potential benefits and risks involved with this procedure.  The benefits include less needle sticks, lab draws from the catheter, and the patient may be discharged home with the catheter. Risks include, but not limited to, infection, bleeding, blood clot (thrombus formation), and puncture of an artery; nerve damage and irregular heartbeat and possibility to perform a PICC exchange if needed/ordered by physician.  Alternatives to this procedure were also discussed.  Bard Power PICC patient education guide, fact sheet on infection prevention and patient information card has been provided to patient /or left at bedside.    PICC/Midline Placement Documentation  PICC Double Lumen 07/23/18 PICC Right Brachial 0 cm 37 cm (Active)  Indication for Insertion or Continuance of Line Prolonged intravenous therapies 07/23/2018  5:50 PM  Exposed Catheter (cm) 0 cm 07/23/2018  5:50 PM  Site Assessment Clean;Dry;Intact 07/23/2018  5:50 PM  Lumen #1 Status Flushed;Blood return noted 07/23/2018  5:50 PM  Lumen #2 Status Flushed;Blood return noted 07/23/2018  5:50 PM  Dressing Type Transparent 07/23/2018  5:50 PM  Dressing Status Clean;Dry;Intact;Antimicrobial disc in place 07/23/2018  5:50 PM  Dressing Intervention New dressing 07/23/2018  5:50 PM  Dressing Change Due 07/30/18 07/23/2018  5:50 PM    Telephone consent by son   Synthia Innocent 07/23/2018, 5:51 PM

## 2018-07-23 NOTE — Progress Notes (Signed)
Subjective/Chief Complaint: Still in great deal of pain, not much out of ng, requiring additional iv pain meds   Objective: Vital signs in last 24 hours: Temp:  [97.6 F (36.4 C)] 97.6 F (36.4 C) (05/19 0218) Pulse Rate:  [67-97] 94 (05/19 0217) Resp:  [14-38] 14 (05/19 0045) BP: (126-171)/(59-107) 171/107 (05/19 0217) SpO2:  [50 %-100 %] 98 % (05/19 0217) Weight:  [65 kg] 65 kg (05/19 0226)    Intake/Output from previous day: No intake/output data recorded. Intake/Output this shift: Total I/O In: -  Out: 50 [Emesis/NG output:50]  GI: moderately tender diffusely but quite tender centrally, no bs well healed incision  Lab Results:  Recent Labs    07/22/18 1903 07/22/18 2302 07/23/18 0236  WBC 7.5  --  10.9*  HGB 14.2 13.3 15.5*  HCT 42.7 39.0 45.1  PLT 274  --  270   BMET Recent Labs    07/22/18 1903 07/22/18 2302 07/23/18 0236  NA 134* 132* 133*  K 4.0 4.4 5.0  CL 99  --  100  CO2 20*  --  20*  GLUCOSE 152*  --  160*  BUN 17  --  16  CREATININE 0.77  --  0.74  CALCIUM 9.9  --  9.6   PT/INR Recent Labs    07/23/18 0236  LABPROT 14.3  INR 1.1   ABG Recent Labs    07/22/18 2302  PHART 7.360  HCO3 23.5    Studies/Results: Ct Abdomen Pelvis W Contrast  Result Date: 07/22/2018 CLINICAL DATA:  Mid abdominal pain. Concern for small bowel obstruction. Nausea and vomiting. EXAM: CT ABDOMEN AND PELVIS WITH CONTRAST TECHNIQUE: Multidetector CT imaging of the abdomen and pelvis was performed using the standard protocol following bolus administration of intravenous contrast. CONTRAST:  13mL OMNIPAQUE IOHEXOL 300 MG/ML  SOLN COMPARISON:  CT dated 04/21/2018. FINDINGS: Lower chest: The heart is significantly enlarged. There is diffuse interlobular septal thickening bilaterally. There is a trace right-sided pleural effusion. There is atelectasis at the lung bases. Hepatobiliary: There is a hyperattenuating 1.6 cm area in hepatic segment 4 a. this is favored  to represent a THAD or benign hepatic hemangioma. However, this finding was not well appreciated on the prior CT. The gallbladder is unremarkable. Pancreas: Unremarkable. No pancreatic ductal dilatation or surrounding inflammatory changes. Spleen: Normal in size without focal abnormality. Adrenals/Urinary Tract: There is scattered areas of cortical scarring bilaterally, right worse than left. This likely represents sequela of old remote infarcts as seen on prior CT. The adrenal glands are unremarkable. There is no hydronephrosis. There are no radiopaque obstructing kidney stones. The urinary bladder is unremarkable. Stomach/Bowel: There is some wall thickening of the gastric antrum. The proximal small bowel is unremarkable. There are multiple hypoenhancing loops of small bowel involving the jejunum and ilium. There is some swirling of the mesentery in the mid abdomen. Air-fluid levels are noted in the small bowel. There is no pneumatosis. Sigmoid diverticulosis is noted. The colon is relatively decompressed. Vascular/Lymphatic: The SMA is patent. There is moderate narrowing at the origin of the celiac axis. The IMA is patent. Atherosclerotic changes are noted of the abdominal aorta without evidence of an aneurysm. Reproductive: Status post hysterectomy. No adnexal masses. Other: There is a fat containing periumbilical hernia.  Small volume Musculoskeletal: There is a stable compression fracture of the L2 vertebral body. Degenerative changes are noted of the lumbar spine. IMPRESSION: 1. Dilated loops of small bowel throughout the abdomen with extensive hypoenhancement, fat stranding, and  swirling of the mesentery is highly concerning for an internal hernia with ischemic small bowel. Correlation with lactate and surgical consultation is recommended. There is no free air. There is a small amount of free fluid in the upper abdomen. 2. The SMA is widely patent. There is a moderate stenosis involving the origin of the  celiac axis. 3. 1.6 cm hyperattenuating wedge-shaped area in hepatic segment 4A, new from prior study. This is favored to represent a flash hemangioma or THAD. Follow-up with nonemergent outpatient ultrasound is recommended. 4. Cardiomegaly with interlobular septal thickening suggestive of developing volume overload. There is a trace right-sided pleural effusion. 5. Cortical thinning of the patient's right kidney consistent with evolving infarcts as demonstrated on prior CT from 04/21/2018. 6. Sigmoid diverticulosis without CT evidence of diverticulitis. These results were called by telephone at the time of interpretation on 07/22/2018 at 9:39 pm to Dr. Trinidad Curet , who verbally acknowledged these results. Electronically Signed   By: Constance Holster M.D.   On: 07/22/2018 21:43   Dg Abd Portable 1v-small Bowel Obstruction Protocol-initial, 8 Hr Delay  Result Date: 07/23/2018 CLINICAL DATA:  Follow up small bowel obstruction EXAM: PORTABLE ABDOMEN - 1 VIEW COMPARISON:  CT from 07/22/2018, plain film from 07/22/2018 FINDINGS: Gastric catheter is noted within the stomach. The gastric fundus is filled with contrast. No significant contrast is seen in the more distal small bowel loops. Mild small bowel dilatation is again noted. Contrast is seen within the renal collecting system and bladder. No acute bony abnormality is noted. IMPRESSION: 8 hour follow-up film demonstrates the majority of contrast material to be within the fundus of the stomach. Right-side-down positioning may be helpful to allow contrast to pass into the small bowel for further evaluation. Electronically Signed   By: Inez Catalina M.D.   On: 07/23/2018 07:12   Dg Abd Portable 1v-small Bowel Protocol-position Verification  Result Date: 07/22/2018 CLINICAL DATA:  NG tube placement EXAM: PORTABLE ABDOMEN - 1 VIEW COMPARISON:  None. FINDINGS: The tip of the NG tube projects over the gastric body/fundus. The visualized bowel gas pattern is  nonspecific. IMPRESSION: NG tube tip projects over the gastric fundus/body. Electronically Signed   By: Constance Holster M.D.   On: 07/22/2018 22:01    Anti-infectives: Anti-infectives (From admission, onward)   Start     Dose/Rate Route Frequency Ordered Stop   07/23/18 0830  cefoTEtan (CEFOTAN) 1 g in sodium chloride 0.9 % 100 mL IVPB     1 g 200 mL/hr over 30 Minutes Intravenous  Once 07/23/18 0825        Assessment/Plan: SBO Peritonitis I think with fact not better with ng, exam and initial ct scan at this point needs to go to or for concern of ischemic bowel with sbo. Discussed with her and her son Dr Donnetta Hutching going to or this am for exploration.  Possible bowel resection, discussed multiple risks with her.    Rolm Bookbinder 07/23/2018

## 2018-07-23 NOTE — Progress Notes (Signed)
OT Cancellation Note  Patient Details Name: Rachel Park MRN: [id number] DOB: 09-Oct-1931   Cancelled Treatment:    Reason Eval/Treat Not Completed: Patient at procedure or test/ unavailable pt currently in surgery. OT will return later when appropriate.   Dorinda Prabhakar OTR/L Acute Rehabilitation Services Office: [place] 07/23/2018, 9:24 AM

## 2018-07-23 NOTE — ED Notes (Addendum)
Attempted to call report to 18M - nurse advised writer that they can not manage pt. with Cardizem drip - admitting MD ( Dr. Jeannine Kitten ) paged by secretary for nurse to update on pt.'s condition . Bed control notified.

## 2018-07-23 NOTE — Progress Notes (Signed)
Attempted to call for report. ED RN made aware that patient has order for cardizem drip in the am. Arin Vanosdol, Wonda Cheng, RN

## 2018-07-23 NOTE — Progress Notes (Signed)
Family Medicine Teaching Service Daily Progress Note Intern Pager: [phone]  Patient name: Rachel Park Medical record number: [id number] Date of birth: 1931/10/17 Age: 85 y.o. Gender: female  Primary Care Provider: Lajean Manes, MD Consultants: General Surgery Code Status: Full  Pt Overview and Major Events to Date:  5/18 Admitted to [facility] with SBO  Assessment and Plan: Rachel Park is a 85 y.o. female presenting with one day of nausea and vomiting with epigastric pain concerning for small bowel obstruction . PMH is significant for small bowel obstruction, hernia,  afib, CVA, hypothyroidism, htn, CAD,   Small bowel obstruction  Hx SBO in April 2018.  CT abdomen concerning for internal hernia and ischemic small bowel, although General Surgery notes no concern for ischemia and initiated SBO protocol. Received 2mg  Dilaudid and 20mg  Morphine overnight.  Surgery planning to take patient to OR today.  Unable to see patient as in surgery. - gen surg consulted, appreciate recs. Recommending small bowel protocol, no emergent need for surgical intervention at this time but hold anticoagulation.  -  NPO - NGT to suction - D5NS @ 155ml/hr mIVF, may consider reducing rate if showing signs of fluid overload - morphine IV 4mg  q4h PRN - zofran IV for nausea - continuous cardiac monitor and pulse ox - PT/OT eval and treat - hold Eliquis for possibility of surgery.   Persistent Atrial fibrillation  Followed closely by [facility], recently saw them in April. Was going to get cardioversion but was in NSR, so amiodarone was started instead.  Failed tikosyn, is on amiodarone 200mg  BID, metoprolol tartrate 25mg  BID, diltiazem 180mg  qdaily, and eliquis.  Currently metoprolol 5mg  IV q6h, dilt gtt has not yet started.  IV amio reserved for patients in RVR.  HR this AM 97. - metop IV 5mg  q6h - will hold off on dilt gtt given moving to surgery and HR well-controlled - holding amiodarone d/t shortage.  If  pt uncontrolled on dilt and metop can start amiodarone gtt.   - holding eliquis d/t possibility of surgery or bowel infarction.   Hypothyroidism - 59mcg daily synthroid at home.  Currently NPO.  - 56mcg IV synthroid  HTN - taking metoprolol BID at home.  BP 171/107 at 0200, this AM 82/62. - IV metop 5mg  q6h.   Incidental hepatic hyperattenuation area. Found on CT abd as a 1.6 wedge-shaped area favored to represent a flash hemangioma or THAD. - f/u outpatient with [place]   FEN/GI: NPO PPx: SCDs  Disposition: pending clinical improvement   Subjective:  Unable to exam patient as in surgery  Objective: Temp:  [97.6 F (36.4 C)] 97.6 F (36.4 C) (05/19 0218) Pulse Rate:  [67-97] 94 (05/19 0217) Resp:  [14-38] 14 (05/19 0045) BP: (126-171)/(59-107) 171/107 (05/19 0217) SpO2:  [50 %-100 %] 98 % (05/19 0217) Weight:  [65 kg] 65 kg (05/19 0226)  Unable to examine patient as in surgery  Laboratory: Recent Labs  Lab 07/22/18 1903 07/22/18 2302 07/23/18 0236  WBC 7.5  --  10.9*  HGB 14.2 13.3 15.5*  HCT 42.7 39.0 45.1  PLT 274  --  270   Recent Labs  Lab 07/22/18 1903 07/22/18 2302 07/23/18 0236  NA 134* 132* 133*  K 4.0 4.4 5.0  CL 99  --  100  CO2 20*  --  20*  BUN 17  --  16  CREATININE 0.77  --  0.74  CALCIUM 9.9  --  9.6  PROT 7.4  --   --  BILITOT 0.8  --   --   ALKPHOS 104  --   --   ALT 42  --   --   AST 38  --   --   GLUCOSE 152*  --  160*    Imaging/Diagnostic Tests: Ct Abdomen Pelvis W Contrast  Result Date: 07/22/2018 CLINICAL DATA:  Mid abdominal pain. Concern for small bowel obstruction. Nausea and vomiting. EXAM: CT ABDOMEN AND PELVIS WITH CONTRAST TECHNIQUE: Multidetector CT imaging of the abdomen and pelvis was performed using the standard protocol following bolus administration of intravenous contrast. CONTRAST:  168mL OMNIPAQUE IOHEXOL 300 MG/ML  SOLN COMPARISON:  CT dated 04/21/2018. FINDINGS: Lower chest: The heart is significantly enlarged.  There is diffuse interlobular septal thickening bilaterally. There is a trace right-sided pleural effusion. There is atelectasis at the lung bases. Hepatobiliary: There is a hyperattenuating 1.6 cm area in hepatic segment 4 a. this is favored to represent a THAD or benign hepatic hemangioma. However, this finding was not well appreciated on the prior CT. The gallbladder is unremarkable. Pancreas: Unremarkable. No pancreatic ductal dilatation or surrounding inflammatory changes. Spleen: Normal in size without focal abnormality. Adrenals/Urinary Tract: There is scattered areas of cortical scarring bilaterally, right worse than left. This likely represents sequela of old remote infarcts as seen on prior CT. The adrenal glands are unremarkable. There is no hydronephrosis. There are no radiopaque obstructing kidney stones. The urinary bladder is unremarkable. Stomach/Bowel: There is some wall thickening of the gastric antrum. The proximal small bowel is unremarkable. There are multiple hypoenhancing loops of small bowel involving the jejunum and ilium. There is some swirling of the mesentery in the mid abdomen. Air-fluid levels are noted in the small bowel. There is no pneumatosis. Sigmoid diverticulosis is noted. The colon is relatively decompressed. Vascular/Lymphatic: The SMA is patent. There is moderate narrowing at the origin of the celiac axis. The IMA is patent. Atherosclerotic changes are noted of the abdominal aorta without evidence of an aneurysm. Reproductive: Status post hysterectomy. No adnexal masses. Other: There is a fat containing periumbilical hernia.  Small volume Musculoskeletal: There is a stable compression fracture of the L2 vertebral body. Degenerative changes are noted of the lumbar spine. IMPRESSION: 1. Dilated loops of small bowel throughout the abdomen with extensive hypoenhancement, fat stranding, and swirling of the mesentery is highly concerning for an internal hernia with ischemic small  bowel. Correlation with lactate and surgical consultation is recommended. There is no free air. There is a small amount of free fluid in the upper abdomen. 2. The SMA is widely patent. 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 3. 1.6 cm hyperattenuating wedge-shaped area in hepatic segment 4A, new from prior study. This is favored to represent a flash hemangioma or THAD. Follow-up with nonemergent outpatient ultrasound is recommended. 4. Cardiomegaly with interlobular septal thickening suggestive of developing volume overload. There is a trace right-sided pleural effusion. 5. Cortical thinning of the patient's right kidney consistent with evolving infarcts as demonstrated on prior CT from 04/21/2018. 6. Sigmoid diverticulosis without CT evidence of diverticulitis. These results were called by telephone at the time of interpretation on 07/22/2018 at 9:39 pm to Dr. Trinidad Curet , who verbally acknowledged these results. Electronically Signed   By: Constance Holster M.D.   On: 07/22/2018 21:43   Dg Abd Portable 1v-small Bowel Obstruction Protocol-initial, 8 Hr Delay  Result Date: 07/23/2018 CLINICAL DATA:  Follow up small bowel obstruction EXAM: PORTABLE ABDOMEN - 1 VIEW COMPARISON:  CT from 07/22/2018, plain film from 07/22/2018 FINDINGS: Gastric catheter is noted within the stomach. The gastric fundus is filled with contrast. No significant contrast is seen in the more distal small bowel loops. Mild small bowel dilatation is again noted. Contrast is seen within the renal collecting system and bladder. No acute bony abnormality is noted. IMPRESSION: 8 hour follow-up film demonstrates the majority of contrast material to be within the fundus of the stomach. Right-side-down positioning may be helpful to allow contrast to pass into the small bowel for further evaluation. Electronically Signed   By: Inez Catalina M.D.   On: 07/23/2018 07:12   Dg Abd Portable 1v-small Bowel  Protocol-position Verification  Result Date: 07/22/2018 CLINICAL DATA:  NG tube placement EXAM: PORTABLE ABDOMEN - 1 VIEW COMPARISON:  None. FINDINGS: The tip of the NG tube projects over the gastric body/fundus. The visualized bowel gas pattern is nonspecific. IMPRESSION: NG tube tip projects over the gastric fundus/body. Electronically Signed   By: Constance Holster M.D.   On: 07/22/2018 22:01    [place], Bernita Raisin, DO 07/23/2018, 8:19 AM PGY-1, [place] Intern pager: [phone], text pages welcome

## 2018-07-23 NOTE — Progress Notes (Signed)
Op note reporting patient transferring to ICU intubated in critical condition.    FPTS will continue to follow socially while in the ICU.  Appreciate excellent care provided by surgical team and CCM.  [place] Constable, D.O.  PGY-1 Family Medicine  07/23/2018 12:19 PM

## 2018-07-23 NOTE — Progress Notes (Signed)
PT Cancellation Note  Patient Details Name: Rachel Park MRN: [id number] DOB: [birth year]   Cancelled Treatment:    Reason Eval/Treat Not Completed: Patient at procedure or test/unavailable. Will follow-up for PT evaluation as schedule permits.  Mabeline Caras, PT, DPT Acute Rehabilitation Services  Pager [phone] Office [phone]  Derry Lory 07/23/2018, 9:33 AM

## 2018-07-23 NOTE — Progress Notes (Signed)
Pt to OR via bed. BP 82/62 manual, so I sent IVF running down to Pre op with patient. I filled out the pre op checklist. Pt completed informed consent. Pt swabbed for MRSA PCR in nare, will send down. I gave report to pre op RN. Holding metoprolol in consult with pre op.

## 2018-07-23 NOTE — Progress Notes (Signed)
FPTS Brief Attending Note  Patient admitted overnight by resident team. I attempted to see patient for admission this AM but she had already been taken to the OR by general surgery for exploratory laparotomy. My colleague Dr. Domenic Schwab will see her later today after surgery as the [facility] attending. Appreciate general surgery recommendations & management.  Chrisandra Netters, MD [place]

## 2018-07-23 NOTE — Anesthesia Preprocedure Evaluation (Addendum)
Anesthesia Evaluation  Patient identified by MRN, date of birth, ID band Patient awake    Reviewed: Allergy & Precautions, NPO status , Patient's Chart, lab work & pertinent test results, reviewed documented beta blocker date and time   Airway Mallampati: II  TM Distance: >3 FB     Dental  (+) Dental Advisory Given   Pulmonary COPD,    breath sounds clear to auscultation       Cardiovascular hypertension, Pt. on medications and Pt. on home beta blockers + CAD and + Cardiac Stents  + dysrhythmias Atrial Fibrillation  Rhythm:Regular Rate:Normal     Neuro/Psych TIACVA    GI/Hepatic negative GI ROS, Neg liver ROS,   Endo/Other  Hypothyroidism   Renal/GU negative Renal ROS     Musculoskeletal  (+) Arthritis ,   Abdominal   Peds  Hematology negative hematology ROS (+)   Anesthesia Other Findings   Reproductive/Obstetrics                             Lab Results  Component Value Date   WBC 10.9 (H) 07/23/2018   HGB 15.5 (H) 07/23/2018   HCT 45.1 07/23/2018   MCV 89.5 07/23/2018   PLT 270 07/23/2018   Lab Results  Component Value Date   CREATININE 0.74 07/23/2018   BUN 16 07/23/2018   NA 133 (L) 07/23/2018   K 5.0 07/23/2018   CL 100 07/23/2018   CO2 20 (L) 07/23/2018    Anesthesia Physical Anesthesia Plan  ASA: IV and emergent  Anesthesia Plan: General   Post-op Pain Management:    Induction: Intravenous and Rapid sequence  PONV Risk Score and Plan: 3 and Dexamethasone, Ondansetron and Treatment may vary due to age or medical condition  Airway Management Planned: Oral ETT  Additional Equipment: Arterial line, CVP and Ultrasound Guidance Line Placement  Intra-op Plan:   Post-operative Plan: Post-operative intubation/ventilation  Informed Consent: I have reviewed the patients History and Physical, chart, labs and discussed the procedure including the risks, benefits and  alternatives for the proposed anesthesia with the patient or authorized representative who has indicated his/her understanding and acceptance.     Dental advisory given  Plan Discussed with: CRNA  Anesthesia Plan Comments:        Anesthesia Quick Evaluation

## 2018-07-23 NOTE — Anesthesia Procedure Notes (Signed)
Arterial Line Insertion Performed by: Wilburn Cornelia, CRNA, CRNA  Preanesthetic checklist: patient identified, IV checked, site marked, risks and benefits discussed, surgical consent, monitors and equipment checked, pre-op evaluation, timeout performed and anesthesia consent Lidocaine 1% used for infiltration Left, radial was placed Catheter size: 20 G Hand hygiene performed  and maximum sterile barriers used  Allen's test indicative of satisfactory collateral circulation Attempts: 2 Procedure performed without using ultrasound guided technique. Following insertion, Biopatch and dressing applied. Post procedure complications: local hematoma. Patient tolerated the procedure well with no immediate complications.

## 2018-07-23 NOTE — Progress Notes (Signed)
Pt arrived to the floor in a great deal of pain. Pt stated that she felt as if she was going to throw up. Pt stated she wanted to use the bathroom upon moving her to the bed. Pt ambulated to the bathroom with assistance.  Pt had a NGT upon arriving to the floor, suction was placed on intermittent suction. Pt was alert and oriented times 4. Pt was giving a bath and her skin was assessed. Pt has a right lower inner leg bruise. No other skin issues to report. Pt's bed alarm was placed on and the pt was educated on using her call bell for any needs.

## 2018-07-23 NOTE — Transfer of Care (Signed)
Immediate Anesthesia Transfer of Care Note  Patient: Rachel Park  Procedure(s) Performed: EXPLORATORY LAPAROTOMY (N/A Abdomen) SMALL BOWEL RESECTION (N/A Abdomen) APPLICATION OF WOUND VAC (N/A Abdomen)  Patient Location: PACU and ICU  Anesthesia Type:General  Level of Consciousness: sedated and Patient remains intubated per anesthesia plan  Airway & Oxygen Therapy: Patient remains intubated per anesthesia plan and Patient placed on Ventilator (see vital sign flow sheet for setting)  Post-op Assessment: Report given to RN and Post -op Vital signs reviewed and stable  Post vital signs: Reviewed and stable  Last Vitals:  Vitals Value Taken Time  BP    Temp    Pulse 63 07/23/2018 12:22 PM  Resp 26 07/23/2018 12:23 PM  SpO2 99 % 07/23/2018 12:22 PM  Vitals shown include unvalidated device data.  Last Pain:  Vitals:   07/23/18 0817  TempSrc:   PainSc: 9       Patients Stated Pain Goal: 2 (15/83/09 4076)  Complications: No apparent anesthesia complications

## 2018-07-23 NOTE — Anesthesia Postprocedure Evaluation (Signed)
Anesthesia Post Note  Patient: Rachel Park  Procedure(s) Performed: EXPLORATORY LAPAROTOMY (N/A Abdomen) SMALL BOWEL RESECTION (N/A Abdomen) APPLICATION OF WOUND VAC (N/A Abdomen)     Patient location during evaluation: SICU Anesthesia Type: General Level of consciousness: sedated Pain management: pain level controlled Vital Signs Assessment: post-procedure vital signs reviewed and stable Respiratory status: patient remains intubated per anesthesia plan Cardiovascular status: stable Postop Assessment: no apparent nausea or vomiting Anesthetic complications: no    Last Vitals:  Vitals:   07/23/18 1400 07/23/18 1415  BP: (!) 87/70   Pulse: 85   Resp: 20 17  Temp:    SpO2: 100%     Last Pain:  Vitals:   07/23/18 0817  TempSrc:   PainSc: 9                  Tiajuana Amass

## 2018-07-23 NOTE — Consult Note (Signed)
NAME:  Rachel Park, MRN:  [id number], DOB:  Jul 19, 1931, LOS: 1 ADMISSION DATE:  07/22/2018, CONSULTATION DATE:  07/23/2018 REFERRING MD:  Dr. Donne Hazel, CHIEF COMPLAINT:  SBO/ Ischemic bowel  Brief History   85 yof with SBO, failed medical management, taken to OR with findings of necrotic bowel s/p resection.  Abd left open with wound vac and returns to ICU on mechanical ventilation.   History of present illness   HPI obtained from chart review as patient is sedated on mechanical ventilation.   85 year old female with history of small bowel obstruction, hernia with prior repair, Afib- on Eliquis, CVA, hypothyroidism, HTN, and CAD presenting with one day history of nausea and vomiting with epigastric pain 5/18. On admit, she had normal WBC with initial lactate of 2.5 improving to 1.2.  Her CT abdomen was concerning for internal hernia and ischemic bowel/ infarction.  Surgery was consulted and NGT placed in which she had resolution of pain, in addition to improving lactate and absence of acidosis was felt reassuring.  Therefore she was admitted to [facility] team.  Overnight, she had minimal NGT output with increasing pain and signs of peritonitis on exam and therefore taken to OR for exploratory laparotomy.  She was found to have necrotic bowel with resection.  Her abdomen was left open with wound vac in placed in anticipation for re-exploration in 48 hours.  She returns to ICU on mechanical ventilation in which PCCM was asked to assist with.    Past Medical History  Small bowel obstruction, hernia with prior repair, Afib- on Eliquis, CVA, hypothyroidism, HTN, CAD  Significant Hospital Events   5/18 Admit 5/19 OR  Consults:   Procedures:  5/19 ETT >> 5/19 left radial aline >> 5/19 R IJ CVL >>  5/19 OR- ex lap/ resection/ wound vac  Significant Diagnostic Tests:  5/18 CT abd >> 1. Dilated loops of small bowel throughout the abdomen with extensive hypoenhancement, fat stranding,  and swirling of the mesentery is highly concerning for an internal hernia with ischemic small bowel. Correlation with lactate and surgical consultation is recommended. There is no free air. There is a small amount of free fluid in the upper abdomen. 2. The SMA is widely patent. 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 3. 1.6 cm hyperattenuating wedge-shaped area in hepatic segment 4A, new from prior study. This is favored to represent a flash hemangioma or THAD. Follow-up with nonemergent outpatient ultrasound is recommended. 4. Cardiomegaly with interlobular septal thickening suggestive of developing volume overload. There is a trace right-sided pleural effusion. 5. Cortical thinning of the patient's right kidney consistent with evolving infarcts as demonstrated on prior CT from 04/21/2018. 6. Sigmoid diverticulosis without CT evidence of diverticulitis.  Micro Data:  5/18 COVID >> neg 5/19 MRSA PCR >> neg  Antimicrobials:  5/19 cefotetan   Interim history/subjective:  Currently on propofol at 50 mcg/kg/min and Neo at 20 mcg/min  Objective   Blood pressure (!) 79/51, pulse 94, temperature 97.6 F (36.4 C), temperature source Oral, resp. rate 14, height 5' 2.5" (1.588 m), weight 65 kg, SpO2 98 %.        Intake/Output Summary (Last 24 hours) at 07/23/2018 1208 Last data filed at 07/23/2018 1201 Gross per 24 hour  Intake 2500 ml  Output 250 ml  Net 2250 ml   Filed Weights   07/23/18 0217 07/23/18 0226  Weight: 65 kg 65 kg   Examination: General:  Critically ill elderly  female sedated on MV HEENT: MM pink/moist, ETT 7.5 at 23, L NGT, pupils 3/reactive, anicteric  Neuro: sedated CV: IRIR PULM: even/non-labored on MV, lungs bilaterally clear GI: midline wound vac in place Extremities: cool/dry, no edema  Skin: no rashes  Resolved Hospital Problem list    Assessment & Plan:   SBO with ischemic bowel s/p ex lap with resection, open  abdomen with wound vac P:  Per CCS- likely to return to OR in 48 hours Continue NGT NPO Defer abx to CCS if needed, no noted gross contamination in op note Trend WBC/ fever curve    Acute respiratory insufficiency in the post-operative setting P:  Full MV support, PRVC 8 cc/kg, rate CXR and ABG now  VAP bundle No weaning with open abd  PAD protocol with propofol/ prn fentanyl   Hypotension - on minimal Neo, likely related to sedation  P:  Neo for goal MAP > 65 Monitor Hgb levels, minimal EBL, was on Eliquis  Monitor UOP/ renal function    Atrial Fib Hx HTN - on amiodarone, metoprolol, diltiazem, and Eliquis P:  Tele monitor Rate currently controlled Hold scheduled IV metoprolol given pressor needs  Hold Eliquis - defer AC to CCS  Hypothyroidism P:  IV synthroid  Best practice:  Diet: NPO Pain/Anxiety/Delirium protocol (if indicated): propofol/ prn fentanyl VAP protocol (if indicated): yes DVT prophylaxis: SCDs GI prophylaxis: PPI Glucose control: CBG q 4, add SSI if > 180 Mobility: BR Code Status: Full  Family Communication:  Disposition: ICU  Labs   CBC: Recent Labs  Lab 07/22/18 1903 07/22/18 2302 07/23/18 0236  WBC 7.5  --  10.9*  HGB 14.2 13.3 15.5*  HCT 42.7 39.0 45.1  MCV 91.8  --  89.5  PLT 274  --  924    Basic Metabolic Panel: Recent Labs  Lab 07/22/18 1903 07/22/18 2302 07/23/18 0236  NA 134* 132* 133*  K 4.0 4.4 5.0  CL 99  --  100  CO2 20*  --  20*  GLUCOSE 152*  --  160*  BUN 17  --  16  CREATININE 0.77  --  0.74  CALCIUM 9.9  --  9.6   GFR: Estimated Creatinine Clearance: 46.9 mL/min (by C-G formula based on SCr of 0.74 mg/dL). Recent Labs  Lab 07/22/18 1903 07/22/18 1929 07/22/18 2210 07/22/18 2240 07/23/18 0236  WBC 7.5  --   --   --  10.9*  LATICACIDVEN  --  2.5* 1.2 1.2  --     Liver Function Tests: Recent Labs  Lab 07/22/18 1903  AST 38  ALT 42  ALKPHOS 104  BILITOT 0.8  PROT 7.4  ALBUMIN 4.3    Recent Labs  Lab 07/22/18 1903  LIPASE 27   No results for input(s): AMMONIA in the last 168 hours.  ABG    Component Value Date/Time   PHART 7.360 07/22/2018 2302   PCO2ART 41.2 07/22/2018 2302   PO2ART 68.0 (L) 07/22/2018 2302   HCO3 23.5 07/22/2018 2302   TCO2 25 07/22/2018 2302   ACIDBASEDEF 2.0 07/22/2018 2302   O2SAT 93.0 07/22/2018 2302     Coagulation Profile: Recent Labs  Lab 07/23/18 0236  INR 1.1    Cardiac Enzymes: No results for input(s): CKTOTAL, CKMB, CKMBINDEX, TROPONINI in the last 168 hours.  HbA1C: Hgb A1c MFr Bld  Date/Time Value Ref Range Status  07/23/2018 02:36 AM 5.8 (H) 4.8 - 5.6 % Final    Comment:    (NOTE) Pre diabetes:  5.7%-6.4% Diabetes:              >6.4% Glycemic control for   <7.0% adults with diabetes   12/25/2014 03:08 AM 5.4 4.8 - 5.6 % Final    Comment:    (NOTE)         Pre-diabetes: 5.7 - 6.4         Diabetes: >6.4         Glycemic control for adults with diabetes: <7.0     CBG: No results for input(s): GLUCAP in the last 168 hours.  Review of Systems:   unable  Past Medical History  She,  has a past medical history of Anginal pain ([place]), Arthritis, Complication of anesthesia, Coronary artery disease, Dyspnea, Hypertension, Hypothyroidism, PAF (paroxysmal atrial fibrillation) ([place]), PONV (postoperative nausea and vomiting), Renal infarction ([place]), Small bowel obstruction ([place]) (06/2015), Stroke ([place]), TIA (transient ischemic attack) (03/24/2015), Vertigo, benign positional, and Wears glasses.   Surgical History    Past Surgical History:  Procedure Laterality Date  . ABDOMINAL HYSTERECTOMY    . CARDIAC CATHETERIZATION N/A 12/24/2014   Procedure: Left Heart Cath and Coronary Angiography;  Surgeon: Sherren Mocha, MD;  Location: [place] CV LAB;  Service: Cardiovascular;  Laterality: N/A;  . CARDIOVERSION N/A 05/22/2018   Procedure: CARDIOVERSION;  Surgeon: Josue Hector, MD;  Location[facility] ENDOSCOPY;   Service: Cardiovascular;  Laterality: N/A;  . CATARACT EXTRACTION Bilateral   . COLONOSCOPY WITH PROPOFOL N/A 12/01/2013   Procedure: COLONOSCOPY WITH PROPOFOL;  Surgeon: Garlan Fair, MD;  Location: [facility] ENDOSCOPY;  Service: Endoscopy;  Laterality: N/A;  . INCISIONAL HERNIA REPAIR N/A 03/10/2016   Procedure: INCISIONAL HERNIA REPAIR TIMES TWO;  Surgeon: Armandina Gemma, MD;  Location: [place];  Service: General;  Laterality: N/A;  . INGUINAL HERNIA REPAIR Bilateral 03/10/2016   Procedure: BILATERAL INGUINAL HERNIA REPAIRS;  Surgeon: Armandina Gemma, MD;  Location: [place];  Service: General;  Laterality: Bilateral;  . INSERTION OF MESH N/A 03/10/2016   Procedure: INSERTION OF MESH TO BILATERAL GROINS AND ABDOMEN;  Surgeon: Armandina Gemma, MD;  Location: [place];  Service: General;  Laterality: N/A;  . laparotomy  06/2015  . LEFT HEART CATH AND CORONARY ANGIOGRAPHY N/A 09/27/2016   Procedure: Left Heart Cath and Coronary Angiography;  Surgeon: Sherren Mocha, MD;  Location: [place] CV LAB;  Service: Cardiovascular;  Laterality: N/A;  . TONSILLECTOMY       Social History   reports that she has never smoked. She has never used smokeless tobacco. She reports previous alcohol use. She reports that she does not use drugs.   Family History   Her family history includes Cancer in her sister; Heart attack in her father; Stroke in her mother.   Allergies Allergies  Allergen Reactions  . Anesthesia S-I-60 Nausea And Vomiting    "with hysterectomy years back had post op N/V"     Home Medications  Prior to Admission medications   Medication Sig Start Date End Date Taking? Authorizing Provider  acetaminophen (TYLENOL) 500 MG tablet Take 500 mg by mouth every 6 (six) hours as needed for mild pain.    Yes [provider]  amiodarone (PACERONE) 200 MG tablet Take 1 tablet (200 mg total) twice daily for one month.  Then reduce and take 1 tablet daily 06/24/18  Yes Nahser, Wonda Cheng, MD  apixaban (ELIQUIS) 5 MG  TABS tablet Take 1 tablet (5 mg total) by mouth 2 (two) times daily. 06/24/18  Yes Nahser, Wonda Cheng, MD  CALCIUM PO Take 1 tablet by mouth daily.   Yes [provider]  cholecalciferol (VITAMIN D) 1000 UNITS tablet Take 2,000 Units by mouth daily with lunch.    Yes [provider]  diltiazem (CARDIZEM CD) 180 MG 24 hr capsule Take 1 capsule (180 mg total) by mouth daily. 06/24/18  Yes Nahser, Wonda Cheng, MD  levothyroxine (SYNTHROID, LEVOTHROID) 75 MCG tablet Take 75 mcg by mouth daily before breakfast.   Yes [provider]  metoprolol tartrate (LOPRESSOR) 25 MG tablet Take 1 tablet (25 mg total) by mouth 2 (two) times daily. 06/24/18  Yes Nahser, Wonda Cheng, MD  NITROSTAT 0.4 MG SL tablet Place 1 tablet (0.4 mg total) under the tongue every 5 (five) minutes as needed. Chest pain 06/24/18  Yes Nahser, Wonda Cheng, MD  Polyethyl Glycol-Propyl Glycol (SYSTANE ULTRA) 0.4-0.3 % SOLN Place 1-2 drops into both eyes 3 (three) times daily as needed (dry eyes).   Yes [provider]  raNITIdine HCl (ZANTAC PO) Take 1 tablet by mouth daily as needed (heartbrun).   Yes [provider]  rosuvastatin (CRESTOR) 10 MG tablet Take 1 tablet (10 mg total) by mouth daily. 06/24/18  Yes Nahser, Wonda Cheng, MD     Critical care time: 35 mins   Kennieth Rad, MSN, AGACNP-BC [facility] Pgr: [phone] or if no answer [phone] 07/23/2018, 2:15 PM

## 2018-07-23 NOTE — Anesthesia Procedure Notes (Signed)
Procedure Name: Intubation Date/Time: 07/23/2018 10:40 AM Performed by: Trinna Post., CRNA Pre-anesthesia Checklist: Patient identified, Emergency Drugs available, Suction available, Patient being monitored and Timeout performed Patient Re-evaluated:Patient Re-evaluated prior to induction Oxygen Delivery Method: Circle system utilized Preoxygenation: Pre-oxygenation with 100% oxygen Induction Type: IV induction, Rapid sequence and Cricoid Pressure applied Laryngoscope Size: Mac and 3 Grade View: Grade I Tube type: Oral Tube size: 7.5 mm Number of attempts: 1 Airway Equipment and Method: Stylet Placement Confirmation: ETT inserted through vocal cords under direct vision,  positive ETCO2 and breath sounds checked- equal and bilateral Secured at: 23 cm Tube secured with: Tape Dental Injury: Teeth and Oropharynx as per pre-operative assessment

## 2018-07-24 ENCOUNTER — Encounter (HOSPITAL_COMMUNITY): Payer: Self-pay | Admitting: General Surgery

## 2018-07-24 ENCOUNTER — Inpatient Hospital Stay (HOSPITAL_COMMUNITY): Payer: Medicare HMO

## 2018-07-24 DIAGNOSIS — Z9889 Other specified postprocedural states: Secondary | ICD-10-CM

## 2018-07-24 DIAGNOSIS — K559 Vascular disorder of intestine, unspecified: Secondary | ICD-10-CM

## 2018-07-24 LAB — RENAL FUNCTION PANEL
Albumin: 3 g/dL — ABNORMAL LOW (ref 3.5–5.0)
Anion gap: 8 (ref 5–15)
BUN: 21 mg/dL (ref 8–23)
CO2: 18 mmol/L — ABNORMAL LOW (ref 22–32)
Calcium: 8.2 mg/dL — ABNORMAL LOW (ref 8.9–10.3)
Chloride: 108 mmol/L (ref 98–111)
Creatinine, Ser: 0.83 mg/dL (ref 0.44–1.00)
GFR calc Af Amer: >60 mL/min (ref >60)
GFR calc non Af Amer: >60 mL/min (ref >60)
Glucose, Bld: 146 mg/dL — ABNORMAL HIGH (ref 70–99)
Phosphorus: 3.9 mg/dL (ref 2.5–4.6)
Potassium: 4.6 mmol/L (ref 3.5–5.1)
Sodium: 134 mmol/L — ABNORMAL LOW (ref 135–145)

## 2018-07-24 LAB — CBC
HCT: 43.4 % (ref 36.0–46.0)
Hemoglobin: 14.4 g/dL (ref 12.0–15.0)
MCH: 30.2 pg (ref 26.0–34.0)
MCHC: 33.2 g/dL (ref 30.0–36.0)
MCV: 91 fL (ref 80.0–100.0)
Platelets: 244 10*3/uL (ref 150–400)
RBC: 4.77 MIL/uL (ref 3.87–5.11)
RDW: 15 % (ref 11.5–15.5)
WBC: 12.6 10*3/uL — ABNORMAL HIGH (ref 4.0–10.5)
nRBC: 0 % (ref 0.0–0.2)

## 2018-07-24 LAB — MAGNESIUM: Magnesium: 1.8 mg/dL (ref 1.7–2.4)

## 2018-07-24 LAB — TRIGLYCERIDES: Triglycerides: 81 mg/dL (ref <150)

## 2018-07-24 MED ORDER — CARVEDILOL 12.5 MG PO TABS
12.5000 mg | ORAL_TABLET | Freq: Two times a day (BID) | ORAL | Status: DC
  Administered 2018-07-24: 12.5 mg via ORAL
  Filled 2018-07-24: qty 1

## 2018-07-24 MED ORDER — CARVEDILOL 12.5 MG PO TABS
12.5000 mg | ORAL_TABLET | Freq: Two times a day (BID) | ORAL | Status: AC
  Administered 2018-07-24: 12.5 mg via ORAL
  Filled 2018-07-24: qty 1

## 2018-07-24 MED ORDER — METOPROLOL TARTRATE 5 MG/5ML IV SOLN
5.0000 mg | Freq: Four times a day (QID) | INTRAVENOUS | Status: DC
  Administered 2018-07-24 (×2): 5 mg via INTRAVENOUS
  Filled 2018-07-24 (×2): qty 5

## 2018-07-24 MED ORDER — CARVEDILOL 25 MG PO TABS
25.0000 mg | ORAL_TABLET | Freq: Two times a day (BID) | ORAL | Status: AC
  Administered 2018-07-25: 25 mg via ORAL
  Filled 2018-07-24: qty 1

## 2018-07-24 NOTE — Progress Notes (Signed)
Initial Nutrition Assessment  DOCUMENTATION CODES:   Not applicable  INTERVENTION:   Monitor for diet advancement s/p additional surgery/extubation  If unable to extubated recommend:  Vital High Protein @ 50 ml/hr  Provides: 1200 kcals (1509 kcal with propofol), 105  grams protein, 1003 ml free water. Meets 107% kcal needs and 100% of protein needs.   NUTRITION DIAGNOSIS:   Increased nutrient needs related to post-op healing as evidenced by estimated needs.  GOAL:   Patient will meet greater than or equal to 90% of their needs  MONITOR:   Diet advancement, Weight trends, Labs, Vent status, TF tolerance, Skin, I & O's  REASON FOR ASSESSMENT:   Ventilator    ASSESSMENT:   Patient with PMH significant for CAD s/p cath, HTN, stroke, TIA, and SBO s/p hernia repair in 2018. Presents this admission small bowel obstruction with ischemic bowel.    5/19- ex lap resection, lysis of adhesions, VAC placement for open abdomen  RD working remotely.  Per surgery, plan for abdominal closure with anastomosis in am. Pt off pressors. Requiring small amount of sedation. NGT to suction. Will monitor for diet advancement or need for nutrition support after additional surgery tomorrow.   Per chart records, pt's weight showed to be stable around 63-69 kg over the last year. Will utilize EDW 65 kg to estimate needs.   Small amount of edema noted in bilateral lower extremities (+1 mild pitting).   Patient is currently intubated on ventilator support MV: 13.6 L/min Temp (24hrs), Avg:98.6 F (37 C), Min:97.5 F (36.4 C), Max:100.2 F (37.9 C)  Propofol: 11.7 ml/hr- provides 309 kcal  I/O: +3,859 ml since admit UOP: 620 ml x 24 hrs NGT: 50 ml x 24 hrs VAC: 800 ml x 24 hrs   Drips: NS @ 100 ml/hr, propofol Labs: Na 134 (L)   Diet Order:   Diet Order            Diet NPO time specified  Diet effective now              EDUCATION NEEDS:   Not appropriate for education at this  time  Skin:  Skin Assessment: Skin Integrity Issues: Skin Integrity Issues:: Wound VAC Wound Vac: open abdomen  Last BM:  PTA  Height:   Ht Readings from Last 1 Encounters:  07/23/18 5' 2.5" (1.588 m)    Weight:   Wt Readings from Last 1 Encounters:  07/24/18 68.8 kg    Ideal Body Weight:  52.3 kg  BMI:  Body mass index is 27.3 kg/m.  Estimated Nutritional Needs:   Kcal:  1408 kcal  Protein:  85-105 grams  Fluid:  >/= 1.4 L/day   Mariana Single RD, LDN Clinical Nutrition Pager # - [phone]

## 2018-07-24 NOTE — Progress Notes (Signed)
1 Day Post-Op   Subjective/Chief Complaint: Doing well on vent, sedated, off pressors   Objective: Vital signs in last 24 hours: Temp:  [97.5 F (36.4 C)-100.2 F (37.9 C)] 97.5 F (36.4 C) (05/20 0338) Pulse Rate:  [80-119] 111 (05/20 0800) Resp:  [12-27] 22 (05/20 0800) BP: (79-164)/(51-112) 135/81 (05/20 0800) SpO2:  [97 %-100 %] 100 % (05/20 0810) Arterial Line BP: (68-167)/(62-110) 145/88 (05/20 0800) FiO2 (%):  [40 %] 40 % (05/20 0810) Weight:  [68.8 kg] 68.8 kg (05/20 0338)    Intake/Output from previous day: 05/19 0701 - 05/20 0700 In: 5289.4 [I.V.:4389.4; IV Piggyback:900] Out: 1570 [Urine:620; Emesis/NG output:50; Drains:800; Blood:100] Intake/Output this shift: Total I/O In: 111.7 [I.V.:111.7] Out: 145 [Urine:45; Drains:100]  GI: soft vac in place   Lab Results:  Recent Labs    07/23/18 1432 07/24/18 0353  WBC 15.4* 12.6*  HGB 14.6 14.4  HCT 43.3 43.4  PLT 231 244   BMET Recent Labs    07/23/18 1432 07/24/18 0353  NA 135 134*  K 4.2 4.6  CL 108 108  CO2 19* 18*  GLUCOSE 160* 146*  BUN 20 21  CREATININE 0.86 0.83  CALCIUM 7.9* 8.2*   PT/INR Recent Labs    07/23/18 0236  LABPROT 14.3  INR 1.1   ABG Recent Labs    07/23/18 1121 07/23/18 1257  PHART 7.250* 7.334*  HCO3 19.1* 17.0*    Studies/Results: Ct Abdomen Pelvis W Contrast  Result Date: 07/22/2018 CLINICAL DATA:  Mid abdominal pain. Concern for small bowel obstruction. Nausea and vomiting. EXAM: CT ABDOMEN AND PELVIS WITH CONTRAST TECHNIQUE: Multidetector CT imaging of the abdomen and pelvis was performed using the standard protocol following bolus administration of intravenous contrast. CONTRAST:  123mL OMNIPAQUE IOHEXOL 300 MG/ML  SOLN COMPARISON:  CT dated 04/21/2018. FINDINGS: Lower chest: The heart is significantly enlarged. There is diffuse interlobular septal thickening bilaterally. There is a trace right-sided pleural effusion. There is atelectasis at the lung bases.  Hepatobiliary: There is a hyperattenuating 1.6 cm area in hepatic segment 4 a. this is favored to represent a THAD or benign hepatic hemangioma. However, this finding was not well appreciated on the prior CT. The gallbladder is unremarkable. Pancreas: Unremarkable. No pancreatic ductal dilatation or surrounding inflammatory changes. Spleen: Normal in size without focal abnormality. Adrenals/Urinary Tract: There is scattered areas of cortical scarring bilaterally, right worse than left. This likely represents sequela of old remote infarcts as seen on prior CT. The adrenal glands are unremarkable. There is no hydronephrosis. There are no radiopaque obstructing kidney stones. The urinary bladder is unremarkable. Stomach/Bowel: There is some wall thickening of the gastric antrum. The proximal small bowel is unremarkable. There are multiple hypoenhancing loops of small bowel involving the jejunum and ilium. There is some swirling of the mesentery in the mid abdomen. Air-fluid levels are noted in the small bowel. There is no pneumatosis. Sigmoid diverticulosis is noted. The colon is relatively decompressed. Vascular/Lymphatic: The SMA is patent. There is moderate narrowing at the origin of the celiac axis. The IMA is patent. Atherosclerotic changes are noted of the abdominal aorta without evidence of an aneurysm. Reproductive: Status post hysterectomy. No adnexal masses. Other: There is a fat containing periumbilical hernia.  Small volume Musculoskeletal: There is a stable compression fracture of the L2 vertebral body. Degenerative changes are noted of the lumbar spine. IMPRESSION: 1. Dilated loops of small bowel throughout the abdomen with extensive hypoenhancement, fat stranding, and swirling of the mesentery is highly concerning for  an internal hernia with ischemic small bowel. Correlation with lactate and surgical consultation is recommended. There is no free air. There is a small amount of free fluid in the upper  abdomen. 2. The SMA is widely patent. 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 3. 1.6 cm hyperattenuating wedge-shaped area in hepatic segment 4A, new from prior study. This is favored to represent a flash hemangioma or THAD. Follow-up with nonemergent outpatient ultrasound is recommended. 4. Cardiomegaly with interlobular septal thickening suggestive of developing volume overload. There is a trace right-sided pleural effusion. 5. Cortical thinning of the patient's right kidney consistent with evolving infarcts as demonstrated on prior CT from 04/21/2018. 6. Sigmoid diverticulosis without CT evidence of diverticulitis. These results were called by telephone at the time of interpretation on 07/22/2018 at 9:39 pm to Dr. Trinidad Curet , who verbally acknowledged these results. Electronically Signed   By: Constance Holster M.D.   On: 07/22/2018 21:43   Portable Chest Xray  Result Date: 07/24/2018 CLINICAL DATA:  Check endotracheal tube placement EXAM: PORTABLE CHEST 1 VIEW COMPARISON:  07/23/2018 FINDINGS: Endotracheal tube and gastric catheter are noted extending into the stomach. Right-sided PICC line is noted at the cavoatrial junction in satisfactory position. Cardiac shadow is within normal limits. The lungs are well aerated with minimal left basilar atelectasis. The right jugular central line has been removed. IMPRESSION: Tubes and lines as described above stable with the exception of interval removal of the right jugular line. Mild left basilar atelectasis. Electronically Signed   By: Inez Catalina M.D.   On: 07/24/2018 07:59   Dg Chest Port 1 View  Result Date: 07/23/2018 CLINICAL DATA:  Status post PICC line placement EXAM: PORTABLE CHEST 1 VIEW COMPARISON:  07/23/2018 FINDINGS: The right-sided PICC line is well positioned with the tip terminating near the cavoatrial junction. Endotracheal tube terminates above the carina. The right IJ line terminates in the right axilla.  There is no pneumothorax. A left basilar airspace opacity is noted. The enteric tube terminates below the left hemidiaphragm. The stomach is distended with contrast. IMPRESSION: 1. Well-positioned right-sided PICC line. 2. No significant interval change in malpositioned right-sided IJ line. 3. Endotracheal tube as above. 4. Persistent left basilar airspace opacity Electronically Signed   By: Constance Holster M.D.   On: 07/23/2018 19:02   Dg Chest Port 1 View  Result Date: 07/23/2018 CLINICAL DATA:  Status post intubation. Status post laparotomy and lysis of adhesions. Small bowel resections. EXAM: PORTABLE CHEST 1 VIEW COMPARISON:  04/21/2018 FINDINGS: ET tube tip is above the carina. There is a right IJ catheter which projects over the right lung apex and is presumably within the right subclavian vein and is oriented distally. There is a enteric tube with tip well below the GE junction. Enteric contrast material opacifies the distended gastric lumen. Normal heart size. Aortic atherosclerosis. Asymmetric elevation of left hemidiaphragm with subsegmental atelectasis. IMPRESSION: 1. The right IJ catheter tip is in the expected location of the right subclavian vein and is oriented distally towards the right upper extremity. 2. Satisfactory position of enteric tube and ET tube. 3. Asymmetric elevation of left hemidiaphragm with mild subsegmental atelectasis. 4. These results will be called to the ordering clinician or representative by the Radiologist Assistant, and communication documented in the PACS or zVision Dashboard. Electronically Signed   By: Kerby Moors M.D.   On: 07/23/2018 13:25   Dg Abd Portable 1v-small Bowel Obstruction Protocol-initial, 8 Hr Delay  Result  Date: 07/23/2018 CLINICAL DATA:  Follow up small bowel obstruction EXAM: PORTABLE ABDOMEN - 1 VIEW COMPARISON:  CT from 07/22/2018, plain film from 07/22/2018 FINDINGS: Gastric catheter is noted within the stomach. The gastric fundus is  filled with contrast. No significant contrast is seen in the more distal small bowel loops. Mild small bowel dilatation is again noted. Contrast is seen within the renal collecting system and bladder. No acute bony abnormality is noted. IMPRESSION: 8 hour follow-up film demonstrates the majority of contrast material to be within the fundus of the stomach. Right-side-down positioning may be helpful to allow contrast to pass into the small bowel for further evaluation. Electronically Signed   By: Inez Catalina M.D.   On: 07/23/2018 07:12   Dg Abd Portable 1v-small Bowel Protocol-position Verification  Result Date: 07/22/2018 CLINICAL DATA:  NG tube placement EXAM: PORTABLE ABDOMEN - 1 VIEW COMPARISON:  None. FINDINGS: The tip of the NG tube projects over the gastric body/fundus. The visualized bowel gas pattern is nonspecific. IMPRESSION: NG tube tip projects over the gastric fundus/body. Electronically Signed   By: Constance Holster M.D.   On: 07/22/2018 22:01   [place] Ekg Site Rite  Result Date: 07/23/2018 If Site Rite image not attached, placement could not be confirmed due to current cardiac rhythm.   Anti-infectives: Anti-infectives (From admission, onward)   Start     Dose/Rate Route Frequency Ordered Stop   07/23/18 1000  cefoTEtan (CEFOTAN) 1 g in sodium chloride 0.9 % 100 mL IVPB     1 g 200 mL/hr over 30 Minutes Intravenous  Once 07/23/18 0825 07/23/18 1117      Assessment/Plan: POD 1 elap, sbr, open abdomen-Briannon Boggio -npo, ng tube -physiologically returning to normal, plan takeback in am with anastomosis and abdominal closure planned Afib -hold anticoagulation -ccm- I added lopressor, can have her po meds via tube and clamp  proph lovenox and scds   Rolm Bookbinder 07/24/2018

## 2018-07-24 NOTE — Progress Notes (Signed)
Family medicine service appreciates excellent care provided by CCM.  We continue to follow socially and will be happy to accept patient back to our service upon transfer to the floor.  [place] Constable, D.O.  PGY-1 Family Medicine  07/24/2018 8:36 AM

## 2018-07-24 NOTE — Progress Notes (Signed)
NAME:  Rachel Park, MRN:  [id number], DOB:  [birth year], LOS: 2 ADMISSION DATE:  07/22/2018, CONSULTATION DATE:  07/23/2018 REFERRING MD:  Dr. Donne Hazel, CHIEF COMPLAINT:  SBO/ Ischemic bowel  Brief History   70 yof with SBO, failed medical management, taken to OR with findings of necrotic bowel s/p resection.  Abd left open with wound vac and returns to ICU on mechanical ventilation.   History of present illness   HPI obtained from chart review as patient is sedated on mechanical ventilation.   85 year old female with history of small bowel obstruction, hernia with prior repair, Afib- on Eliquis, CVA, hypothyroidism, HTN, and CAD presenting with one day history of nausea and vomiting with epigastric pain 5/18. On admit, she had normal WBC with initial lactate of 2.5 improving to 1.2.  Her CT abdomen was concerning for internal hernia and ischemic bowel/ infarction.  Surgery was consulted and NGT placed in which she had resolution of pain, in addition to improving lactate and absence of acidosis was felt reassuring.  Therefore she was admitted to [facility] team.  Overnight, she had minimal NGT output with increasing pain and signs of peritonitis on exam and therefore taken to OR for exploratory laparotomy.  She was found to have necrotic bowel with resection.  Her abdomen was left open with wound vac in placed in anticipation for re-exploration in 48 hours.  She returns to ICU on mechanical ventilation in which PCCM was asked to assist with.    Past Medical History  Small bowel obstruction, hernia with prior repair, Afib- on Eliquis, CVA, hypothyroidism, HTN, CAD  Significant Hospital Events   5/18 Admit 5/19 OR  Consults:   Procedures:  5/19 ETT >> 5/19 left radial aline >> 5/19 R IJ CVL >>  5/19 OR- ex lap/ resection/ wound vac  Significant Diagnostic Tests:  5/18 CT abd >> 1. Dilated loops of small bowel throughout the abdomen with extensive hypoenhancement, fat stranding,  and swirling of the mesentery is highly concerning for an internal hernia with ischemic small bowel. Correlation with lactate and surgical consultation is recommended. There is no free air. There is a small amount of free fluid in the upper abdomen. 2. The SMA is widely patent. 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 3. 1.6 cm hyperattenuating wedge-shaped area in hepatic segment 4A, new from prior study. This is favored to represent a flash hemangioma or THAD. Follow-up with nonemergent outpatient ultrasound is recommended. 4. Cardiomegaly with interlobular septal thickening suggestive of developing volume overload. There is a trace right-sided pleural effusion. 5. Cortical thinning of the patient's right kidney consistent with evolving infarcts as demonstrated on prior CT from 04/21/2018. 6. Sigmoid diverticulosis without CT evidence of diverticulitis.  Micro Data:  5/18 COVID >> neg 5/19 MRSA PCR >> neg  Antimicrobials:  5/19 cefotetan   Interim history/subjective:   No issues overnight. Following commands this morning. Hypertensive.   Objective   Blood pressure (!) 170/104, pulse 99, temperature 99.1 F (37.3 C), temperature source Oral, resp. rate (!) 25, height 5' 2.5" (1.588 m), weight 68.8 kg, SpO2 100 %.    Vent Mode: PRVC FiO2 (%):  [40 %] 40 % Set Rate:  [16 bmp] 16 bmp Vt Set:  [410 mL] 410 mL PEEP:  [5 cmH20] 5 cmH20 Plateau Pressure:  [15 cmH20-17 cmH20] 17 cmH20   Intake/Output Summary (Last 24 hours) at 07/24/2018 1226 Last data filed at 07/24/2018 1200 Gross per 24 hour  Intake 3234.85 ml  Output 1620 ml  Net 1614.85 ml   Filed Weights   07/23/18 0217 07/23/18 0226 07/24/18 0338  Weight: 65 kg 65 kg 68.8 kg   Examination: General:  Elderly fm, intubated, on vent, comfortable  HEENT: MMM, NCAT, sclera clear, tracking  Neuro: following commands, moving all 4 extremities  CV: irregularly irregular, s1 s2  PULM: BL  vented breaths GI: midline incision, wound vac in place  Extremities: cool, dry, no edema  Skin: no rash   Resolved Hospital Problem list    Assessment & Plan:   SBO with ischemic bowel s/p ex lap with resection, open abdomen with wound vac P:  Post op management per CCS NGT in place  NPO except meds and clamp  Going back to OR in the morning per surgery   Acute respiratory insufficiency in the post-operative setting P:  Adult vent protocol  Wean as tolerated Stable on PRVC 8 cc/kg  VAP ppx  PAD protocol  Sedation with fent and propofol  Can consider liberation from vent once abdomen closed and euvolemic, this will given her the best chance not needing reintubation   Hypotension, med effect, weaned off neo at this time  P:  Off pressors at this time will observe   Atrial Fib Hx HTN - on amiodarone, metoprolol, diltiazem, and Eliquis P:  Added coreg for BP control and rate control  Will observe  Was on IV metoprolol, probable able to stop this and have better BP control with coreg  Restart AC per CCS following surgery   Hypothyroidism P:  IV synthroid   Best practice:  Diet: NPO Pain/Anxiety/Delirium protocol (if indicated): propofol/ prn fentanyl VAP protocol (if indicated): yes DVT prophylaxis: SCDs GI prophylaxis: PPI Glucose control: CBG q 4, add SSI if > 180 Mobility: BR Code Status: Full  Family Communication:  Disposition: ICU  Labs   CBC: Recent Labs  Lab 07/22/18 1903  07/23/18 0236 07/23/18 1121 07/23/18 1257 07/23/18 1432 07/24/18 0353  WBC 7.5  --  10.9*  --   --  15.4* 12.6*  HGB 14.2   < > 15.5* 16.3* 13.9 14.6 14.4  HCT 42.7   < > 45.1 48.0* 41.0 43.3 43.4  MCV 91.8  --  89.5  --   --  92.1 91.0  PLT 274  --  270  --   --  231 244   < > = values in this interval not displayed.    Basic Metabolic Panel: Recent Labs  Lab 07/22/18 1903  07/23/18 0236 07/23/18 1121 07/23/18 1257 07/23/18 1432 07/24/18 0353  NA 134*   < > 133*  135 137 135 134*  K 4.0   < > 5.0 4.8 4.1 4.2 4.6  CL 99  --  100  --   --  108 108  CO2 20*  --  20*  --   --  19* 18*  GLUCOSE 152*  --  160*  --   --  160* 146*  BUN 17  --  16  --   --  20 21  CREATININE 0.77  --  0.74  --   --  0.86 0.83  CALCIUM 9.9  --  9.6  --   --  7.9* 8.2*  MG  --   --   --   --   --   --  1.8  PHOS  --   --   --   --   --   --  3.9   < > = values in this interval not displayed.   GFR: Estimated Creatinine Clearance: 46.4 mL/min (by C-G formula based on SCr of 0.83 mg/dL). Recent Labs  Lab 07/22/18 1903 07/22/18 1929 07/22/18 2210 07/22/18 2240 07/23/18 0236 07/23/18 1432 07/24/18 0353  WBC 7.5  --   --   --  10.9* 15.4* 12.6*  LATICACIDVEN  --  2.5* 1.2 1.2  --   --   --     Liver Function Tests: Recent Labs  Lab 07/22/18 1903 07/24/18 0353  AST 38  --   ALT 42  --   ALKPHOS 104  --   BILITOT 0.8  --   PROT 7.4  --   ALBUMIN 4.3 3.0*   Recent Labs  Lab 07/22/18 1903  LIPASE 27   No results for input(s): AMMONIA in the last 168 hours.  ABG    Component Value Date/Time   PHART 7.334 (L) 07/23/2018 1257   PCO2ART 31.9 (L) 07/23/2018 1257   PO2ART 132.0 (H) 07/23/2018 1257   HCO3 17.0 (L) 07/23/2018 1257   TCO2 18 (L) 07/23/2018 1257   ACIDBASEDEF 8.0 (H) 07/23/2018 1257   O2SAT 99.0 07/23/2018 1257     Coagulation Profile: Recent Labs  Lab 07/23/18 0236  INR 1.1    Cardiac Enzymes: No results for input(s): CKTOTAL, CKMB, CKMBINDEX, TROPONINI in the last 168 hours.  HbA1C: Hgb A1c MFr Bld  Date/Time Value Ref Range Status  07/23/2018 02:36 AM 5.8 (H) 4.8 - 5.6 % Final    Comment:    (NOTE) Pre diabetes:          5.7%-6.4% Diabetes:              >6.4% Glycemic control for   <7.0% adults with diabetes   12/25/2014 03:08 AM 5.4 4.8 - 5.6 % Final    Comment:    (NOTE)         Pre-diabetes: 5.7 - 6.4         Diabetes: >6.4         Glycemic control for adults with diabetes: <7.0     CBG: No results for  input(s): GLUCAP in the last 168 hours.  Review of Systems:   unable  Past Medical History  She,  has a past medical history of Anginal pain ([place]), Arthritis, Complication of anesthesia, Coronary artery disease, Dyspnea, Hypertension, Hypothyroidism, PAF (paroxysmal atrial fibrillation) ([place]), PONV (postoperative nausea and vomiting), Renal infarction ([place]), Small bowel obstruction ([place]) (06/2015), Stroke ([place]), TIA (transient ischemic attack) (03/24/2015), Vertigo, benign positional, and Wears glasses.   Surgical History    Past Surgical History:  Procedure Laterality Date  . ABDOMINAL HYSTERECTOMY    . CARDIAC CATHETERIZATION N/A 12/24/2014   Procedure: Left Heart Cath and Coronary Angiography;  Surgeon: Sherren Mocha, MD;  Location: [place] [facility];  Service: Cardiovascular;  Laterality: N/A;  . CARDIOVERSION N/A 05/22/2018   Procedure: CARDIOVERSION;  Surgeon: Josue Hector, MD;  Location: [facility] ENDOSCOPY;  Service: Cardiovascular;  Laterality: N/A;  . CATARACT EXTRACTION Bilateral   . COLONOSCOPY WITH PROPOFOL N/A 12/01/2013   Procedure: COLONOSCOPY WITH PROPOFOL;  Surgeon: Garlan Fair, MD;  Location: [facility] ENDOSCOPY;  Service: Endoscopy;  Laterality: N/A;  . INCISIONAL HERNIA REPAIR N/A 03/10/2016   Procedure: INCISIONAL HERNIA REPAIR TIMES TWO;  Surgeon: Armandina Gemma, MD;  Location: [place];  Service: General;  Laterality: N/A;  . INGUINAL HERNIA REPAIR Bilateral 03/10/2016   Procedure: BILATERAL INGUINAL HERNIA REPAIRS;  Surgeon:  Armandina Gemma, MD;  Location: [place];  Service: General;  Laterality: Bilateral;  . INSERTION OF MESH N/A 03/10/2016   Procedure: INSERTION OF MESH TO BILATERAL GROINS AND ABDOMEN;  Surgeon: Armandina Gemma, MD;  Location: [place];  Service: General;  Laterality: N/A;  . laparotomy  06/2015  . LEFT HEART CATH AND CORONARY ANGIOGRAPHY N/A 09/27/2016   Procedure: Left Heart Cath and Coronary Angiography;  Surgeon: Sherren Mocha, MD;  Location: [place] [facility];   Service: Cardiovascular;  Laterality: N/A;  . TONSILLECTOMY       Social History   reports that she has never smoked. She has never used smokeless tobacco. She reports previous alcohol use. She reports that she does not use drugs.   Family History   Her family history includes Cancer in her sister; Heart attack in her father; Stroke in her mother.   Allergies Allergies  Allergen Reactions  . Anesthesia S-I-60 Nausea And Vomiting    "with hysterectomy years back had post op N/V"     Home Medications  Prior to Admission medications   Medication Sig Start Date End Date Taking? Authorizing Provider  acetaminophen (TYLENOL) 500 MG tablet Take 500 mg by mouth every 6 (six) hours as needed for mild pain.    Yes [provider]  amiodarone (PACERONE) 200 MG tablet Take 1 tablet (200 mg total) twice daily for one month.  Then reduce and take 1 tablet daily 06/24/18  Yes Nahser, Wonda Cheng, MD  apixaban (ELIQUIS) 5 MG TABS tablet Take 1 tablet (5 mg total) by mouth 2 (two) times daily. 06/24/18  Yes Nahser, Wonda Cheng, MD  CALCIUM PO Take 1 tablet by mouth daily.   Yes [provider]  cholecalciferol (VITAMIN D) 1000 UNITS tablet Take 2,000 Units by mouth daily with lunch.    Yes [provider]  diltiazem (CARDIZEM CD) 180 MG 24 hr capsule Take 1 capsule (180 mg total) by mouth daily. 06/24/18  Yes Nahser, Wonda Cheng, MD  levothyroxine (SYNTHROID, LEVOTHROID) 75 MCG tablet Take 75 mcg by mouth daily before breakfast.   Yes [provider]  metoprolol tartrate (LOPRESSOR) 25 MG tablet Take 1 tablet (25 mg total) by mouth 2 (two) times daily. 06/24/18  Yes Nahser, Wonda Cheng, MD  NITROSTAT 0.4 MG SL tablet Place 1 tablet (0.4 mg total) under the tongue every 5 (five) minutes as needed. Chest pain 06/24/18  Yes Nahser, Wonda Cheng, MD  Polyethyl Glycol-Propyl Glycol (SYSTANE ULTRA) 0.4-0.3 % SOLN Place 1-2 drops into both eyes 3 (three) times daily as needed (dry eyes).   Yes  [provider]  raNITIdine HCl (ZANTAC PO) Take 1 tablet by mouth daily as needed (heartbrun).   Yes [provider]  rosuvastatin (CRESTOR) 10 MG tablet Take 1 tablet (10 mg total) by mouth daily. 06/24/18  Yes Nahser, Wonda Cheng, MD     This patient is critically ill with multiple organ system failure; which, requires frequent high complexity decision making, assessment, support, evaluation, and titration of therapies. This was completed through the application of advanced monitoring technologies and extensive interpretation of multiple databases. During this encounter critical care time was devoted to patient care services described in this note for 34 minutes.   Garner Nash, DO [facility] 07/24/2018 12:26 PM  Personal pager: [phone] If unanswered, please page CCM On-call: [phone]

## 2018-07-24 NOTE — Progress Notes (Signed)
PT Cancellation Note  Patient Details Name: Rachel Park MRN: [id number] DOB: [birth year]   Cancelled Treatment:    Reason Eval/Treat Not Completed: Patient not medically ready. Pt on vent and sedated. Plan for further surgery tomorrow AM. PT will continue to f/u with pt acutely as available and appropriate.    [place] 07/24/2018, 9:03 AM

## 2018-07-25 ENCOUNTER — Encounter (HOSPITAL_COMMUNITY)
Admission: EM | Disposition: A | Discharge status: Home Health | Payer: Self-pay | Source: Home / Self Care | Attending: Family Medicine

## 2018-07-25 ENCOUNTER — Inpatient Hospital Stay (HOSPITAL_COMMUNITY): Payer: Medicare HMO | Admitting: Certified Registered Nurse Anesthetist

## 2018-07-25 ENCOUNTER — Inpatient Hospital Stay (HOSPITAL_COMMUNITY): Payer: Medicare HMO

## 2018-07-25 DIAGNOSIS — G8918 Other acute postprocedural pain: Secondary | ICD-10-CM

## 2018-07-25 HISTORY — PX: COMPLEX WOUND CLOSURE: SHX6446

## 2018-07-25 HISTORY — PX: ILEOCECETOMY: SHX5857

## 2018-07-25 LAB — BASIC METABOLIC PANEL
Anion gap: 7 (ref 5–15)
BUN: 20 mg/dL (ref 8–23)
CO2: 19 mmol/L — ABNORMAL LOW (ref 22–32)
Calcium: 8 mg/dL — ABNORMAL LOW (ref 8.9–10.3)
Chloride: 109 mmol/L (ref 98–111)
Creatinine, Ser: 0.62 mg/dL (ref 0.44–1.00)
GFR calc Af Amer: >60 mL/min (ref >60)
GFR calc non Af Amer: >60 mL/min (ref >60)
Glucose, Bld: 128 mg/dL — ABNORMAL HIGH (ref 70–99)
Potassium: 4.4 mmol/L (ref 3.5–5.1)
Sodium: 135 mmol/L (ref 135–145)

## 2018-07-25 LAB — GLUCOSE, CAPILLARY
Glucose-Capillary: 103 mg/dL — ABNORMAL HIGH (ref 70–99)
Glucose-Capillary: 104 mg/dL — ABNORMAL HIGH (ref 70–99)
Glucose-Capillary: 118 mg/dL — ABNORMAL HIGH (ref 70–99)
Glucose-Capillary: 96 mg/dL (ref 70–99)

## 2018-07-25 LAB — CBC
HCT: 39.6 % (ref 36.0–46.0)
Hemoglobin: 13.3 g/dL (ref 12.0–15.0)
MCH: 30.5 pg (ref 26.0–34.0)
MCHC: 33.6 g/dL (ref 30.0–36.0)
MCV: 90.8 fL (ref 80.0–100.0)
Platelets: 224 10*3/uL (ref 150–400)
RBC: 4.36 MIL/uL (ref 3.87–5.11)
RDW: 15.1 % (ref 11.5–15.5)
WBC: 11 10*3/uL — ABNORMAL HIGH (ref 4.0–10.5)
nRBC: 0 % (ref 0.0–0.2)

## 2018-07-25 LAB — TRIGLYCERIDES: Triglycerides: 116 mg/dL (ref <150)

## 2018-07-25 SURGERY — EXCISION, CECUM WITH ILEUM
Anesthesia: General | Site: Abdomen

## 2018-07-25 MED ORDER — ROCURONIUM BROMIDE 10 MG/ML (PF) SYRINGE
PREFILLED_SYRINGE | INTRAVENOUS | Status: AC
  Filled 2018-07-25: qty 10

## 2018-07-25 MED ORDER — 0.9 % SODIUM CHLORIDE (POUR BTL) OPTIME
TOPICAL | Status: DC | PRN
  Administered 2018-07-25 (×2): 2000 mL

## 2018-07-25 MED ORDER — PHENYLEPHRINE 40 MCG/ML (10ML) SYRINGE FOR IV PUSH (FOR BLOOD PRESSURE SUPPORT)
PREFILLED_SYRINGE | INTRAVENOUS | Status: AC
  Filled 2018-07-25: qty 10

## 2018-07-25 MED ORDER — METOPROLOL TARTRATE 5 MG/5ML IV SOLN
5.0000 mg | Freq: Four times a day (QID) | INTRAVENOUS | Status: DC | PRN
  Administered 2018-07-26 (×2): 2.5 mg via INTRAVENOUS
  Filled 2018-07-25: qty 5

## 2018-07-25 MED ORDER — CEFAZOLIN SODIUM-DEXTROSE 2-4 GM/100ML-% IV SOLN
2.0000 g | Freq: Once | INTRAVENOUS | Status: DC
  Filled 2018-07-25: qty 100

## 2018-07-25 MED ORDER — LIDOCAINE 2% (20 MG/ML) 5 ML SYRINGE
INTRAMUSCULAR | Status: AC
  Filled 2018-07-25: qty 5

## 2018-07-25 MED ORDER — PROPOFOL 10 MG/ML IV BOLUS
INTRAVENOUS | Status: DC | PRN
  Administered 2018-07-25: 20 mg via INTRAVENOUS

## 2018-07-25 MED ORDER — CEFAZOLIN SODIUM-DEXTROSE 2-4 GM/100ML-% IV SOLN
2.0000 g | INTRAVENOUS | Status: AC
  Administered 2018-07-25: 2 g via INTRAVENOUS

## 2018-07-25 MED ORDER — HYDROMORPHONE HCL 1 MG/ML IJ SOLN
1.0000 mg | INTRAMUSCULAR | Status: DC | PRN
  Administered 2018-07-25: 0.5 mg via INTRAVENOUS
  Administered 2018-07-25 – 2018-07-30 (×18): 1 mg via INTRAVENOUS
  Filled 2018-07-25 (×19): qty 1

## 2018-07-25 MED ORDER — SODIUM CHLORIDE 0.9 % IV SOLN
INTRAVENOUS | Status: DC | PRN
  Administered 2018-07-25: 20 ug/min via INTRAVENOUS

## 2018-07-25 MED ORDER — PROPOFOL 10 MG/ML IV BOLUS
INTRAVENOUS | Status: AC
  Filled 2018-07-25: qty 20

## 2018-07-25 MED ORDER — ROCURONIUM BROMIDE 10 MG/ML (PF) SYRINGE
PREFILLED_SYRINGE | INTRAVENOUS | Status: DC | PRN
  Administered 2018-07-25: 20 mg via INTRAVENOUS
  Administered 2018-07-25: 50 mg via INTRAVENOUS

## 2018-07-25 MED ORDER — CHLORHEXIDINE GLUCONATE 0.12 % MT SOLN
OROMUCOSAL | Status: AC
  Administered 2018-07-25: 15 mL
  Filled 2018-07-25: qty 15

## 2018-07-25 MED ORDER — PHENYLEPHRINE 40 MCG/ML (10ML) SYRINGE FOR IV PUSH (FOR BLOOD PRESSURE SUPPORT)
PREFILLED_SYRINGE | INTRAVENOUS | Status: DC | PRN
  Administered 2018-07-25: 80 ug via INTRAVENOUS
  Administered 2018-07-25 (×2): 120 ug via INTRAVENOUS

## 2018-07-25 MED ORDER — CEFAZOLIN SODIUM 1 G IJ SOLR
INTRAMUSCULAR | Status: AC
  Filled 2018-07-25: qty 20

## 2018-07-25 MED ORDER — FENTANYL CITRATE (PF) 250 MCG/5ML IJ SOLN
INTRAMUSCULAR | Status: AC
  Filled 2018-07-25: qty 5

## 2018-07-25 MED ORDER — LACTATED RINGERS IV SOLN
INTRAVENOUS | Status: DC | PRN
  Administered 2018-07-25: 11:00:00 via INTRAVENOUS

## 2018-07-25 MED ORDER — FENTANYL CITRATE (PF) 250 MCG/5ML IJ SOLN
INTRAMUSCULAR | Status: DC | PRN
  Administered 2018-07-25 (×3): 25 ug via INTRAVENOUS
  Administered 2018-07-25: 50 ug via INTRAVENOUS
  Administered 2018-07-25: 25 ug via INTRAVENOUS
  Administered 2018-07-25: 50 ug via INTRAVENOUS
  Administered 2018-07-25 (×2): 25 ug via INTRAVENOUS

## 2018-07-25 MED ORDER — ALBUMIN HUMAN 5 % IV SOLN
INTRAVENOUS | Status: DC | PRN
  Administered 2018-07-25: 12:00:00 via INTRAVENOUS

## 2018-07-25 SURGICAL SUPPLY — 50 items
BLADE CLIPPER SURG (BLADE) IMPLANT
BNDG GAUZE ELAST 4 BULKY (GAUZE/BANDAGES/DRESSINGS) ×2 IMPLANT
CANISTER SUCT 3000ML PPV (MISCELLANEOUS) ×3 IMPLANT
CHLORAPREP W/TINT 26ML (MISCELLANEOUS) ×3 IMPLANT
COVER SURGICAL LIGHT HANDLE (MISCELLANEOUS) ×3 IMPLANT
COVER WAND RF STERILE (DRAPES) ×3 IMPLANT
DRAPE LAPAROSCOPIC ABDOMINAL (DRAPES) ×3 IMPLANT
DRAPE WARM FLUID 44X44 (DRAPE) ×3 IMPLANT
DRSG OPSITE POSTOP 4X10 (GAUZE/BANDAGES/DRESSINGS) IMPLANT
DRSG OPSITE POSTOP 4X8 (GAUZE/BANDAGES/DRESSINGS) IMPLANT
ELECT BLADE 6.5 EXT (BLADE) IMPLANT
ELECT CAUTERY BLADE 6.4 (BLADE) ×2 IMPLANT
ELECT REM PT RETURN 9FT ADLT (ELECTROSURGICAL) ×3
ELECTRODE REM PT RTRN 9FT ADLT (ELECTROSURGICAL) ×2 IMPLANT
GLOVE BIO SURGEON STRL SZ7 (GLOVE) ×3 IMPLANT
GLOVE BIOGEL PI IND STRL 7.5 (GLOVE) ×2 IMPLANT
GLOVE BIOGEL PI INDICATOR 7.5 (GLOVE) ×1
GOWN STRL REUS W/ TWL LRG LVL3 (GOWN DISPOSABLE) ×4 IMPLANT
GOWN STRL REUS W/TWL LRG LVL3 (GOWN DISPOSABLE) ×6
KIT BASIN OR (CUSTOM PROCEDURE TRAY) ×3 IMPLANT
KIT TURNOVER KIT B (KITS) ×3 IMPLANT
LIGASURE IMPACT 36 18CM CVD LR (INSTRUMENTS) ×2 IMPLANT
NS IRRIG 1000ML POUR BTL (IV SOLUTION) ×6 IMPLANT
PACK GENERAL/GYN (CUSTOM PROCEDURE TRAY) ×3 IMPLANT
PAD ABD 8X10 STRL (GAUZE/BANDAGES/DRESSINGS) ×2 IMPLANT
PAD ARMBOARD 7.5X6 YLW CONV (MISCELLANEOUS) ×3 IMPLANT
PENCIL SMOKE EVACUATOR (MISCELLANEOUS) ×3 IMPLANT
RELOAD PROXIMATE 75MM BLUE (ENDOMECHANICALS) ×6 IMPLANT
RELOAD STAPLE 75 3.8 BLU REG (ENDOMECHANICALS) IMPLANT
SPECIMEN JAR LARGE (MISCELLANEOUS) IMPLANT
SPONGE LAP 18X18 RF (DISPOSABLE) IMPLANT
STAPLER GUN LINEAR PROX 60 (STAPLE) ×2 IMPLANT
STAPLER VISISTAT 35W (STAPLE) ×3 IMPLANT
SUCTION POOLE TIP (SUCTIONS) ×3 IMPLANT
SUT NOVA 1 T20/GS 25DT (SUTURE) ×4 IMPLANT
SUT PDS AB 1 TP1 96 (SUTURE) ×6 IMPLANT
SUT SILK 2 0 (SUTURE) ×3
SUT SILK 2 0 SH CR/8 (SUTURE) ×5 IMPLANT
SUT SILK 2-0 18XBRD TIE 12 (SUTURE) ×2 IMPLANT
SUT SILK 3 0 (SUTURE) ×3
SUT SILK 3 0 SH CR/8 (SUTURE) ×3 IMPLANT
SUT SILK 3-0 18XBRD TIE 12 (SUTURE) ×2 IMPLANT
SUT VIC AB 2-0 SH 27 (SUTURE) ×3
SUT VIC AB 2-0 SH 27X BRD (SUTURE) ×1 IMPLANT
SUT VIC AB 3-0 SH 27 (SUTURE)
SUT VIC AB 3-0 SH 27X BRD (SUTURE) IMPLANT
TAPE CLOTH SURG 6X10 WHT LF (GAUZE/BANDAGES/DRESSINGS) ×2 IMPLANT
TOWEL OR 17X26 10 PK STRL BLUE (TOWEL DISPOSABLE) ×3 IMPLANT
TRAY FOLEY MTR SLVR 16FR STAT (SET/KITS/TRAYS/PACK) ×3 IMPLANT
YANKAUER SUCT BULB TIP NO VENT (SUCTIONS) IMPLANT

## 2018-07-25 NOTE — Anesthesia Preprocedure Evaluation (Addendum)
Anesthesia Evaluation  Patient identified by MRN, date of birth, ID band Patient awake    Reviewed: Unable to perform ROS - Chart review only  Airway Mallampati: Intubated       Dental   Pulmonary     + decreased breath sounds      Cardiovascular hypertension,  Rhythm:Irregular Rate:Normal     Neuro/Psych    GI/Hepatic   Endo/Other    Renal/GU      Musculoskeletal   Abdominal   Peds  Hematology   Anesthesia Other Findings   Reproductive/Obstetrics                           Anesthesia Physical Anesthesia Plan  ASA: III  Anesthesia Plan: General   Post-op Pain Management:    Induction: Intravenous  PONV Risk Score and Plan:   Airway Management Planned: Oral ETT  Additional Equipment: Arterial line  Intra-op Plan:   Post-operative Plan: Post-operative intubation/ventilation  Informed Consent: I have reviewed the patients History and Physical, chart, labs and discussed the procedure including the risks, benefits and alternatives for the proposed anesthesia with the patient or authorized representative who has indicated his/her understanding and acceptance.       Plan Discussed with: CRNA and Anesthesiologist  Anesthesia Plan Comments:         Anesthesia Quick Evaluation

## 2018-07-25 NOTE — Evaluation (Signed)
Physical Therapy Evaluation Patient Details Name: Rachel Park MRN: [id number] DOB: [birth year] Today's Date: 07/25/2018   History of Present Illness  80 yof with SBO, failed medical management, taken to OR with findings of necrotic bowel s/p resection.  Abd left open with wound vac and returns to ICU on mechanical ventilation. Plan for extubation 07/25/18 after another surgery.   Clinical Impression  Pt admitted with above diagnosis. Pt currently with functional limitations due to the deficits listed below (see PT Problem List). Pt was able to perform exercises in bed.  Nurse asked for bed level eval as plan is to extubate pt later today. Pt has good strength and was able to complete several exercises.  Once mobility is allowed, pt should progress well.  Will follow acutely.   Pt will benefit from skilled PT to increase their independence and safety with mobility to allow discharge to the venue listed below.      Follow Up Recommendations Other (comment)(TBA, may need CIR)    Equipment Recommendations  Other (comment)(TBA)    Recommendations for Other Services       Precautions / Restrictions Precautions Precautions: Fall Precaution Comments: wound VAC abdomen Restrictions Weight Bearing Restrictions: No      Mobility  Bed Mobility               General bed mobility comments: NT as nurse agreed to bed level eval only.    Transfers                 General transfer comment: TBA  Ambulation/Gait             General Gait Details: TBA  Stairs            Wheelchair Mobility    Modified Rankin (Stroke Patients Only)       Balance                                             Pertinent Vitals/Pain Pain Assessment: No/denies pain    Home Living Family/patient expects to be discharged to:: Private residence Living Arrangements: Alone Available Help at Discharge: Family;Available PRN/intermittently Type of Home: House Home  Access: Level entry     Home Layout: One level Home Equipment: None      Prior Function Level of Independence: Independent               Hand Dominance   Dominant Hand: Right    Extremity/Trunk Assessment   Upper Extremity Assessment Upper Extremity Assessment: Defer to OT evaluation    Lower Extremity Assessment Lower Extremity Assessment: Generalized weakness       Communication   Communication: (Intubated)  Cognition Arousal/Alertness: Awake/alert Behavior During Therapy: Flat affect Overall Cognitive Status: Difficult to assess                                        General Comments General comments (skin integrity, edema, etc.): VSS during exercises in bed.      Exercises General Exercises - Upper Extremity Shoulder Flexion: AROM;Both;10 reps;Supine Shoulder Horizontal ADduction: AROM;Both;10 reps;Supine Elbow Flexion: AROM;Both;10 reps;Supine Elbow Extension: AROM;Both;10 reps;Supine Wrist Flexion: AROM;Both;10 reps;Supine Wrist Extension: AROM;Both;10 reps;Supine General Exercises - Lower Extremity Ankle Circles/Pumps: AROM;Both;10 reps;Supine Quad Sets: AROM;Both;10 reps;Supine Heel Slides: AROM;Both;10 reps;Supine Hip ABduction/ADduction:  AROM;Both;10 reps;Supine   Assessment/Plan    PT Assessment Patient needs continued PT services  PT Problem List Decreased activity tolerance;Decreased balance;Decreased mobility;Decreased knowledge of use of DME;Decreased safety awareness;Decreased knowledge of precautions;Cardiopulmonary status limiting activity       PT Treatment Interventions DME instruction;Gait training;Functional mobility training;Therapeutic activities;Therapeutic exercise;Balance training;Patient/family education    PT Goals (Current goals can be found in the Care Plan section)  Acute Rehab PT Goals Patient Stated Goal: unable to state due to intubation PT Goal Formulation: With patient Time For Goal Achievement:  08/08/18 Potential to Achieve Goals: Good    Frequency Min 3X/week   Barriers to discharge Decreased caregiver support      Co-evaluation               AM-PAC PT "6 Clicks" Mobility  Outcome Measure Help needed turning from your back to your side while in a flat bed without using bedrails?: A Lot Help needed moving from lying on your back to sitting on the side of a flat bed without using bedrails?: A Lot Help needed moving to and from a bed to a chair (including a wheelchair)?: A Lot Help needed standing up from a chair using your arms (e.g., wheelchair or bedside chair)?: A Lot Help needed to walk in hospital room?: Total Help needed climbing 3-5 steps with a railing? : Total 6 Click Score: 10    End of Session Equipment Utilized During Treatment: Gait belt;Other (comment)(Intubated) Activity Tolerance: Patient tolerated treatment well Patient left: in bed;with call bell/phone within reach Nurse Communication: Mobility status PT Visit Diagnosis: Muscle weakness (generalized) (M62.81)    Time: 3606-7703 PT Time Calculation (min) (ACUTE ONLY): 16 min   Charges:   PT Evaluation $PT Eval Moderate Complexity: [place] Pager:  [phone]  Office:  [place] 07/25/2018, 8:46 AM

## 2018-07-25 NOTE — Op Note (Signed)
Preoperative diagnosis: small bowel obstruction from internal hernia s/p sbr with open abdomen Postoperative diagnosis: same as above Procedure: 1. Reopening recent laparotomy 2. Ileocecectomy with ileocolonic anastomosis 3. Abdominal wall closure Surgeon: Dr Serita Grammes Asst: Dr Georganna Skeans EBL: minimal Drains none Specimens ileocecectomy Sponge and needle count correct dispo to recovery stable  Indications: This is an 78 yof I operated on 48 hours ago for sbo with small bowel resection. She has had open abdomen for past 48 hours and has a planned return for open abdomen.    Procedure: After informed consent was obtained from the patient's son when she returned to the operating room.  She was placed under general anesthesia.  She was given antibiotics.  SCDs were ready in place.  She had her negative pressure dressing removed.  She was prepped and draped in a standard sterile surgical fashion.  Surgical timeout was then performed.  I reopened her recent laparotomy.  I then explored her entire abdomen.  The liver was hemostatic right taken it down from the anterior abdominal wall previously.  The nasogastric tube was in good position.  Her colon was all healthy.  There was no contamination.  I then proceeded to run her small bowel from the ligament of Treitz to where it was left in discontinuity.  This proximal portion was all very healthy.  The 36 cm of terminal ileum that I left in place was ischemic.  This needed to be removed.  After some conversation I elected to perform an ileocecectomy and then perform an ileocolonic anastomosis.  She has been really off pressors for 48 hours.  Prior to this she was healthy.  I think that the risk of giving her a more proximal ileostomy in the dehydration outweighed the risks of an anastomotic leak at the ileocolonic anastomosis.  I then proceeded to divide the cecum with a GIA stapler.  I used a combination of the LigaSure device and silk sutures to  divide the mesentery of the terminal ileum and this portion of the cecum.  This was then passed off the table.  I then approximated the mesentery to close this defect with 2-0 silk suture.  I then brought the terminal ileum and the cecum into proximity with 3-0 silk sutures.  I then made enterotomies in both.  I created a anastomosis with a GIA stapler.  This was hemostatic.  I then closed the common enterotomy with a TX stapler.  I placed 2-0 Vicryl apex sutures.  This was patent.  I then irrigated copiously.  I then closed her fascia with #1 looped PDS and every few centimeters I placed a #1 Novafil as well.  I then packed her wound with Kerlix.  She tolerated this well and will be returned to the ICU.

## 2018-07-25 NOTE — Progress Notes (Signed)
RT note- Attempted twice to wean, first attempt, patient was in too much pain, splinting. Second attempt pain medication given, now too sleepy. Back to full support, continue to monitor for extubation.

## 2018-07-25 NOTE — Progress Notes (Signed)
PCCM:  Patient post-op, returns to ICU on mechanical ventilation.  She is awake, complaining of pain also hypertensive.  In CPAP/PS mode TV is too small to consider liberation from vent (170-200cc on 15/5).  Once pain better controlled we will see if she is able to come off.   [place] [facility] 07/25/2018 2:26 PM

## 2018-07-25 NOTE — Anesthesia Postprocedure Evaluation (Signed)
Anesthesia Post Note  Patient: Rachel Park  Procedure(s) Performed: Ileocecetomy (Abdomen) Complex Wound Closure (Abdomen)     Patient location during evaluation: SICU Anesthesia Type: General Level of consciousness: sedated and patient remains intubated per anesthesia plan Pain management: pain level controlled Vital Signs Assessment: post-procedure vital signs reviewed and stable Respiratory status: patient remains intubated per anesthesia plan and patient on ventilator - see flowsheet for VS Cardiovascular status: stable Postop Assessment: no apparent nausea or vomiting Anesthetic complications: no    Last Vitals:  Vitals:   07/25/18 1645 07/25/18 1700  BP: 114/82 121/70  Pulse: (!) 123 (!) 110  Resp: 16 16  Temp:    SpO2: 99% 100%    Last Pain:  Vitals:   07/25/18 1347  TempSrc: Oral                 Eliah Ozawa COKER

## 2018-07-25 NOTE — Progress Notes (Signed)
eLink Physician-Brief Progress Note Patient Name: Rachel Park DOB: [birth year] MRN: [id number]   Date of Service  07/25/2018  HPI/Events of Note  Notified of Afib in RVR.  Pt is s/p abdominal wall closure.  She has known afib and is supposed to be on coreg.   On video assessment, BP 126/62, HR 126, RR 16.  Pt is on propofol.  eICU Interventions  Adequate pain control first.  Can give lopressor if HR persists in the 120s.     Intervention Category Intermediate Interventions: Arrhythmia - evaluation and management  Elsie Lincoln 07/25/2018, 11:18 PM

## 2018-07-25 NOTE — Progress Notes (Signed)
RT note-Patient back from the OR, will attempt to wean when awake.

## 2018-07-25 NOTE — Progress Notes (Signed)
Per Surgery note, planning to return to OR today.  Possibly will be extubated later today.  FPTS continues to follow socially and will gladly resume care of patient when transferred from ICU.  Appreciate excellent care provided by surgery and CCM.  [place] Constable, D.O.  PGY-1 Family Medicine  07/25/2018 9:06 AM

## 2018-07-25 NOTE — Progress Notes (Signed)
NAME:  Rachel Park, MRN:  [id number], DOB:  [birth year], LOS: 3 ADMISSION DATE:  07/22/2018, CONSULTATION DATE:  07/23/2018 REFERRING MD:  Dr. Donne Hazel, CHIEF COMPLAINT:  SBO/ Ischemic bowel  Brief History   85 yof with SBO, failed medical management, taken to OR with findings of necrotic bowel s/p resection.  Abd left open with wound vac and returns to ICU on mechanical ventilation.   History of present illness   HPI obtained from chart review as patient is sedated on mechanical ventilation.   85 year old female with history of small bowel obstruction, hernia with prior repair, Afib- on Eliquis, CVA, hypothyroidism, HTN, and CAD presenting with one day history of nausea and vomiting with epigastric pain 5/18. On admit, she had normal WBC with initial lactate of 2.5 improving to 1.2.  Her CT abdomen was concerning for internal hernia and ischemic bowel/ infarction.  Surgery was consulted and NGT placed in which she had resolution of pain, in addition to improving lactate and absence of acidosis was felt reassuring.  Therefore she was admitted to [facility] team.  Overnight, she had minimal NGT output with increasing pain and signs of peritonitis on exam and therefore taken to OR for exploratory laparotomy.  She was found to have necrotic bowel with resection.  Her abdomen was left open with wound vac in placed in anticipation for re-exploration in 48 hours.  She returns to ICU on mechanical ventilation in which PCCM was asked to assist with.    Past Medical History  Small bowel obstruction, hernia with prior repair, Afib- on Eliquis, CVA, hypothyroidism, HTN, CAD  Significant Hospital Events   5/18 Admit 5/19 OR  Consults:   Procedures:  5/19 ETT >> 5/19 left radial aline >> 5/19 R IJ CVL >>  5/19 OR- ex lap/ resection/ wound vac  Significant Diagnostic Tests:  5/18 CT abd >> 1. Dilated loops of small bowel throughout the abdomen with extensive hypoenhancement, fat stranding,  and swirling of the mesentery is highly concerning for an internal hernia with ischemic small bowel. Correlation with lactate and surgical consultation is recommended. There is no free air. There is a small amount of free fluid in the upper abdomen. 2. The SMA is widely patent. 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 3. 1.6 cm hyperattenuating wedge-shaped area in hepatic segment 4A, new from prior study. This is favored to represent a flash hemangioma or THAD. Follow-up with nonemergent outpatient ultrasound is recommended. 4. Cardiomegaly with interlobular septal thickening suggestive of developing volume overload. There is a trace right-sided pleural effusion. 5. Cortical thinning of the patient's right kidney consistent with evolving infarcts as demonstrated on prior CT from 04/21/2018. 6. Sigmoid diverticulosis without CT evidence of diverticulitis.  Micro Data:  5/18 COVID >> neg 5/19 MRSA PCR >> neg  Antimicrobials:  5/19 cefotetan x 1 dose  Ancef 5/21 x 1 preop   Interim history/subjective:  No issues overnight , following simple commands.  Awakes to voice , appears comfortable  To go to OR today at 11am .   Objective   Blood pressure (!) 159/100, pulse (!) 114, temperature 99.4 F (37.4 C), temperature source Axillary, resp. rate 17, height 5' 2.5" (1.588 m), weight 69.7 kg, SpO2 99 %.    Vent Mode: PRVC FiO2 (%):  [40 %] 40 % Set Rate:  [16 bmp] 16 bmp Vt Set:  [410 mL] 410 mL PEEP:  [5 cmH20] 5 cmH20 Plateau Pressure:  [14 cmH20-16  cmH20] 16 cmH20   Intake/Output Summary (Last 24 hours) at 07/25/2018 9470 Last data filed at 07/25/2018 0800 Gross per 24 hour  Intake 2719.22 ml  Output 1015 ml  Net 1704.22 ml   Filed Weights   07/23/18 0226 07/24/18 0338 07/25/18 0442  Weight: 65 kg 68.8 kg 69.7 kg   Examination: General:  Elderly female , intubated on vent , nad  HEENT: MMM , NCAT ,   Neuro: follows commands, mae x 4  CV:  ST , no mrg  PULM: coarse BS on vent  GI: midline incision , wound vac in place   Extremities: warm , dry , tr edema  Skin: intact , no rash , wound vac to abd wound    Resolved Hospital Problem list    Assessment & Plan:   SBO with ischemic bowel s/p ex lap with resection, open abdomen with wound vac 5/21 -to OR today at 11 for closure  P:  Post op management per CCS NGT in place  NPO except meds and clamp  Going back to OR today   Acute respiratory insufficiency in the post-operative setting P:  Adult vent protocol  Wean as tolerated Stable on PRVC 8 cc/kg  VAP ppx  PAD protocol  Sedation with fent and propofol  Can consider liberation from vent once abdomen closed and euvolemic, this will given her the best chance not needing reintubation   Hypotension,-resolved  HTN   P:  Cont to follow b/p   Atrial Fib Hx HTN - on amiodarone, metoprolol, diltiazem, and Eliquis P:  Cont Coreg for BP control and rate control  Restart AC per CCS following surgery   Hypothyroidism P:  IV synthroid   Best practice:  Diet: NPO Pain/Anxiety/Delirium protocol (if indicated): propofol/ prn fentanyl VAP protocol (if indicated): yes DVT prophylaxis: SCDs GI prophylaxis: PPI Glucose control: CBG q 4, add SSI if > 180 Mobility: BR Code Status: Full  Family Communication:  Disposition: ICU  Labs   CBC: Recent Labs  Lab 07/22/18 1903  07/23/18 0236 07/23/18 1121 07/23/18 1257 07/23/18 1432 07/24/18 0353 07/25/18 0010  WBC 7.5  --  10.9*  --   --  15.4* 12.6* 11.0*  HGB 14.2   < > 15.5* 16.3* 13.9 14.6 14.4 13.3  HCT 42.7   < > 45.1 48.0* 41.0 43.3 43.4 39.6  MCV 91.8  --  89.5  --   --  92.1 91.0 90.8  PLT 274  --  270  --   --  231 244 224   < > = values in this interval not displayed.    Basic Metabolic Panel: Recent Labs  Lab 07/22/18 1903  07/23/18 0236 07/23/18 1121 07/23/18 1257 07/23/18 1432 07/24/18 0353 07/25/18 0318  NA 134*   < > 133* 135 137 135  134* 135  K 4.0   < > 5.0 4.8 4.1 4.2 4.6 4.4  CL 99  --  100  --   --  108 108 109  CO2 20*  --  20*  --   --  19* 18* 19*  GLUCOSE 152*  --  160*  --   --  160* 146* 128*  BUN 17  --  16  --   --  20 21 20   CREATININE 0.77  --  0.74  --   --  0.86 0.83 0.62  CALCIUM 9.9  --  9.6  --   --  7.9* 8.2* 8.0*  MG  --   --   --   --   --   --  1.8  --   PHOS  --   --   --   --   --   --  3.9  --    < > = values in this interval not displayed.   GFR: Estimated Creatinine Clearance: 48.5 mL/min (by C-G formula based on SCr of 0.62 mg/dL). Recent Labs  Lab 07/22/18 1929 07/22/18 2210 07/22/18 2240 07/23/18 0236 07/23/18 1432 07/24/18 0353 07/25/18 0010  WBC  --   --   --  10.9* 15.4* 12.6* 11.0*  LATICACIDVEN 2.5* 1.2 1.2  --   --   --   --     Liver Function Tests: Recent Labs  Lab 07/22/18 1903 07/24/18 0353  AST 38  --   ALT 42  --   ALKPHOS 104  --   BILITOT 0.8  --   PROT 7.4  --   ALBUMIN 4.3 3.0*   Recent Labs  Lab 07/22/18 1903  LIPASE 27   No results for input(s): AMMONIA in the last 168 hours.  ABG    Component Value Date/Time   PHART 7.334 (L) 07/23/2018 1257   PCO2ART 31.9 (L) 07/23/2018 1257   PO2ART 132.0 (H) 07/23/2018 1257   HCO3 17.0 (L) 07/23/2018 1257   TCO2 18 (L) 07/23/2018 1257   ACIDBASEDEF 8.0 (H) 07/23/2018 1257   O2SAT 99.0 07/23/2018 1257     Coagulation Profile: Recent Labs  Lab 07/23/18 0236  INR 1.1    Cardiac Enzymes: No results for input(s): CKTOTAL, CKMB, CKMBINDEX, TROPONINI in the last 168 hours.  HbA1C: Hgb A1c MFr Bld  Date/Time Value Ref Range Status  07/23/2018 02:36 AM 5.8 (H) 4.8 - 5.6 % Final    Comment:    (NOTE) Pre diabetes:          5.7%-6.4% Diabetes:              >6.4% Glycemic control for   <7.0% adults with diabetes   12/25/2014 03:08 AM 5.4 4.8 - 5.6 % Final    Comment:    (NOTE)         Pre-diabetes: 5.7 - 6.4         Diabetes: >6.4         Glycemic control for adults with diabetes: <7.0      CBG: Recent Labs  Lab 07/25/18 0013 07/25/18 0407  GLUCAP 104* 103*    Review of Systems:   unable  Past Medical History  She,  has a past medical history of Anginal pain ([place]), Arthritis, Complication of anesthesia, Coronary artery disease, Dyspnea, Hypertension, Hypothyroidism, PAF (paroxysmal atrial fibrillation) ([place]), PONV (postoperative nausea and vomiting), Renal infarction ([place]), Small bowel obstruction ([place]) (06/2015), Stroke ([place]), TIA (transient ischemic attack) (03/24/2015), Vertigo, benign positional, and Wears glasses.   Surgical History    Past Surgical History:  Procedure Laterality Date  . ABDOMINAL HYSTERECTOMY    . APPLICATION OF WOUND VAC N/A 07/23/2018   Procedure: APPLICATION OF WOUND VAC;  Surgeon: Rolm Bookbinder, MD;  Location: [place];  Service: General;  Laterality: N/A;  . BOWEL RESECTION N/A 07/23/2018   Procedure: SMALL BOWEL RESECTION;  Surgeon: Rolm Bookbinder, MD;  Location: [place];  Service: General;  Laterality: N/A;  . CARDIAC CATHETERIZATION N/A 12/24/2014   Procedure: Left Heart Cath and Coronary Angiography;  Surgeon: Sherren Mocha, MD;  Location: [place] [facility];  Service: Cardiovascular;  Laterality: N/A;  . CARDIOVERSION N/A 05/22/2018   Procedure: CARDIOVERSION;  Surgeon: Josue Hector, MD;  Location: [facility]  ENDOSCOPY;  Service: Cardiovascular;  Laterality: N/A;  . CATARACT EXTRACTION Bilateral   . COLONOSCOPY WITH PROPOFOL N/A 12/01/2013   Procedure: COLONOSCOPY WITH PROPOFOL;  Surgeon: Garlan Fair, MD;  Location: [facility] ENDOSCOPY;  Service: Endoscopy;  Laterality: N/A;  . INCISIONAL HERNIA REPAIR N/A 03/10/2016   Procedure: INCISIONAL HERNIA REPAIR TIMES TWO;  Surgeon: Armandina Gemma, MD;  Location: [place];  Service: General;  Laterality: N/A;  . INGUINAL HERNIA REPAIR Bilateral 03/10/2016   Procedure: BILATERAL INGUINAL HERNIA REPAIRS;  Surgeon: Armandina Gemma, MD;  Location: [place];  Service: General;  Laterality: Bilateral;  . INSERTION OF  MESH N/A 03/10/2016   Procedure: INSERTION OF MESH TO BILATERAL GROINS AND ABDOMEN;  Surgeon: Armandina Gemma, MD;  Location: [place];  Service: General;  Laterality: N/A;  . laparotomy  06/2015  . LAPAROTOMY N/A 07/23/2018   Procedure: EXPLORATORY LAPAROTOMY;  Surgeon: Rolm Bookbinder, MD;  Location: [place];  Service: General;  Laterality: N/A;  . LEFT HEART CATH AND CORONARY ANGIOGRAPHY N/A 09/27/2016   Procedure: Left Heart Cath and Coronary Angiography;  Surgeon: Sherren Mocha, MD;  Location: [place] [facility];  Service: Cardiovascular;  Laterality: N/A;  . TONSILLECTOMY       Social History   reports that she has never smoked. She has never used smokeless tobacco. She reports previous alcohol use. She reports that she does not use drugs.   Family History   Her family history includes Cancer in her sister; Heart attack in her father; Stroke in her mother.   Allergies Allergies  Allergen Reactions  . Anesthesia S-I-60 Nausea And Vomiting    "with hysterectomy years back had post op N/V"     Home Medications  Prior to Admission medications   Medication Sig Start Date End Date Taking? Authorizing Provider  acetaminophen (TYLENOL) 500 MG tablet Take 500 mg by mouth every 6 (six) hours as needed for mild pain.    Yes [provider]  amiodarone (PACERONE) 200 MG tablet Take 1 tablet (200 mg total) twice daily for one month.  Then reduce and take 1 tablet daily 06/24/18  Yes Nahser, Wonda Cheng, MD  apixaban (ELIQUIS) 5 MG TABS tablet Take 1 tablet (5 mg total) by mouth 2 (two) times daily. 06/24/18  Yes Nahser, Wonda Cheng, MD  CALCIUM PO Take 1 tablet by mouth daily.   Yes [provider]  cholecalciferol (VITAMIN D) 1000 UNITS tablet Take 2,000 Units by mouth daily with lunch.    Yes [provider]  diltiazem (CARDIZEM CD) 180 MG 24 hr capsule Take 1 capsule (180 mg total) by mouth daily. 06/24/18  Yes Nahser, Wonda Cheng, MD  levothyroxine (SYNTHROID, LEVOTHROID) 75 MCG  tablet Take 75 mcg by mouth daily before breakfast.   Yes [provider]  metoprolol tartrate (LOPRESSOR) 25 MG tablet Take 1 tablet (25 mg total) by mouth 2 (two) times daily. 06/24/18  Yes Nahser, Wonda Cheng, MD  NITROSTAT 0.4 MG SL tablet Place 1 tablet (0.4 mg total) under the tongue every 5 (five) minutes as needed. Chest pain 06/24/18  Yes Nahser, Wonda Cheng, MD  Polyethyl Glycol-Propyl Glycol (SYSTANE ULTRA) 0.4-0.3 % SOLN Place 1-2 drops into both eyes 3 (three) times daily as needed (dry eyes).   Yes [provider]  raNITIdine HCl (ZANTAC PO) Take 1 tablet by mouth daily as needed (heartbrun).   Yes [provider]  rosuvastatin (CRESTOR) 10 MG tablet Take 1 tablet (10 mg total) by mouth daily. 06/24/18  Yes Nahser, Wonda Cheng, MD       Janneth Krasner,NP  [facility] 07/25/2018 9:17 AM  [phone]

## 2018-07-25 NOTE — Discharge Summary (Signed)
[facility] Discharge Summary  Patient name: Rachel Park Medical record number: [id number] Date of birth: [birth year] Age: 85 y.o. Gender: female Date of Admission: 07/22/2018  Date of Discharge: 08/12/2018 Admitting Physician: Benay Pike, MD  Primary Care Provider: Lajean Manes, MD Consultants: Surgery, Cardiology, Vascular  Indication for Hospitalization: SBO  Discharge Diagnoses/Problem List:  Wound dehiscence s/p ileocolic anastomosis Possible ileus Volume overload Hypokalemia Atrial fibrillation Right IJ and right brachial DVT Insomnia Small left pleural effusion Hypothyroidism Pseudo-hypocalcemia Normocytic anemia  Disposition: home with [facility]  Discharge Condition: stable  Discharge Exam:   Physical Exam:  General: 85 y.o. female in NAD Cardio: irregular Lungs: CTAB, no wheezing, no rhonchi, no crackles, no IWOB on RA Abdomen: Midline abdominal wound with binder on Skin: warm and dry Extremities: No edema   Brief Hospital Course:  Rachel Park is a 85 y.o. female presenting with one day of nausea and vomiting found to have ischemic bowel. PMH is significant for small bowel obstruction, hernia,  afib, CVA, hypothyroidism, htn, CAD.  Her hospital course is outlined below.  Patient with prior history of small bowel obstruction in April 2018.  Had CT abdomen concerning for internal hernia and ischemic small bowel.  Initially, patient was managed medically with NGT overnight and did not have improvement.  She was then taken to the OR the morning of 5/19 for an exploratory laparotomy and was noted to have necrotic bowel and had resection.  She was transferred from surgery to the ICU requiring continued intubation and pressor support.  She was able to wean off of pressors, but remained intubated the following day.  On 5/21, patient returned to the OR for exploration during which she had an ileocecectomy with ileocolonic anastomosis.   She was  extubated on 5/22.  The patient was transferred to the family medicine inpatient service. The patient was started on TPN initially until her diet could be advanced.  The patient had a BM on 5/25. At this point her diet was advanced and by 6/1 she was tolerating a soft diet.  She received IV zosyn from 5/29 until 6/2, and patient was afebrile with improving WBC.  The patient worked with PT, who recommended Inpatient Rehabilitation however she was denied by insurance. Home health services were arranged and available at time of discharge.   The patient's wound dehisced during her hospitalization, this was monitored closely by surgery.  They noted that they would not place a wound VAC until she has more granulation tissue.  She was deemed clear for transfer to CIR by surgery and they stated they would continue to follow.  The patient has preexisting afib, for which she was on a DOAC and metoprolol.  Her doac was initially held after surgery while her metoprolol was converted to IV. On 5/27 her metoprolol was converted back to PO and she was started on Eliquis.   Patient also with volume overload during her hospitalization that was diuresed with IV Lasix.  She was transitioned to p.o. torsemide, but was taken off on 6/5.  She was not discharged with diuretic therapy, but noted she may require some in the future.  During admission, patient developed right upper extremity DVT likely 2/2 to catheter placement. Given she was already on chronic anticoagulation, heparin was not started per Vascular Surgery recommendations. Swelling improved daily.  Issues for Follow Up:  1. Possible liver lesion, consider abd [place]  2. F/u surgery for continued open wound. 3. Patient may need cardioversion  when improved from surgical standpoint.   4. K was 3.0 prior to discharge.  Patient was given K-dur 40 mEq x1.  Please recheck K within 1 week. 5. Patient may require some additional diuresis in the future, but was euvolemic on  exam on discharge.  Significant Procedures: 5/19 bowel resection, 9/56 ileocolic anastomosis  Significant Labs and Imaging:  Recent Labs  Lab 08/10/18 0421 08/11/18 0351 08/12/18 0335  WBC 14.0* 10.3 13.4*  HGB 9.3* 9.2* 9.5*  HCT 28.1* 28.0* 28.7*  PLT 679* 678* 713*   Recent Labs  Lab 08/06/18 1347  08/08/18 0436 08/09/18 0343 08/10/18 0421 08/11/18 0351 08/12/18 0335  NA 132*   < > 134* 134* 135 134* 133*  K 3.8   < > 3.8 4.2 3.7 3.5 3.0*  CL 98   < > 95* 98 101 102 104  CO2 26   < > 29 28 25 24 23   GLUCOSE 124*   < > 95 94 96 93 96  BUN 18   < > 23 26* 20 16 16   CREATININE 0.57   < > 0.58 0.67 0.55 0.46 0.45  CALCIUM 8.0*   < > 8.5* 8.3* 8.2* 8.0* 7.9*  MG 1.9  --   --   --   --   --  1.7   < > = values in this interval not displayed.    Ct Abdomen Pelvis W Contrast  Result Date: 07/22/2018 CLINICAL DATA:  Mid abdominal pain. Concern for small bowel obstruction. Nausea and vomiting. EXAM: CT ABDOMEN AND PELVIS WITH CONTRAST TECHNIQUE: Multidetector CT imaging of the abdomen and pelvis was performed using the standard protocol following bolus administration of intravenous contrast. CONTRAST:  117mL OMNIPAQUE IOHEXOL 300 MG/ML  SOLN COMPARISON:  CT dated 04/21/2018. FINDINGS: Lower chest: The heart is significantly enlarged. There is diffuse interlobular septal thickening bilaterally. There is a trace right-sided pleural effusion. There is atelectasis at the lung bases. Hepatobiliary: There is a hyperattenuating 1.6 cm area in hepatic segment 4 a. this is favored to represent a THAD or benign hepatic hemangioma. However, this finding was not well appreciated on the prior CT. The gallbladder is unremarkable. Pancreas: Unremarkable. No pancreatic ductal dilatation or surrounding inflammatory changes. Spleen: Normal in size without focal abnormality. Adrenals/Urinary Tract: There is scattered areas of cortical scarring bilaterally, right worse than left. This likely represents  sequela of old remote infarcts as seen on prior CT. The adrenal glands are unremarkable. There is no hydronephrosis. There are no radiopaque obstructing kidney stones. The urinary bladder is unremarkable. Stomach/Bowel: There is some wall thickening of the gastric antrum. The proximal small bowel is unremarkable. There are multiple hypoenhancing loops of small bowel involving the jejunum and ilium. There is some swirling of the mesentery in the mid abdomen. Air-fluid levels are noted in the small bowel. There is no pneumatosis. Sigmoid diverticulosis is noted. The colon is relatively decompressed. Vascular/Lymphatic: The SMA is patent. There is moderate narrowing at the origin of the celiac axis. The IMA is patent. Atherosclerotic changes are noted of the abdominal aorta without evidence of an aneurysm. Reproductive: Status post hysterectomy. No adnexal masses. Other: There is a fat containing periumbilical hernia.  Small volume Musculoskeletal: There is a stable compression fracture of the L2 vertebral body. Degenerative changes are noted of the lumbar spine. IMPRESSION: 1. Dilated loops of small bowel throughout the abdomen with extensive hypoenhancement, fat stranding, and swirling of the mesentery is highly concerning for an internal hernia with ischemic  small bowel. Correlation with lactate and surgical consultation is recommended. There is no free air. There is a small amount of free fluid in the upper abdomen. 2. The SMA is widely patent. 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 3. 1.6 cm hyperattenuating wedge-shaped area in hepatic segment 4A, new from prior study. This is favored to represent a flash hemangioma or THAD. Follow-up with nonemergent outpatient ultrasound is recommended. 4. Cardiomegaly with interlobular septal thickening suggestive of developing volume overload. There is a trace right-sided pleural effusion. 5. Cortical thinning of the patient's right kidney  consistent with evolving infarcts as demonstrated on prior CT from 04/21/2018. 6. Sigmoid diverticulosis without CT evidence of diverticulitis. These results were called by telephone at the time of interpretation on 07/22/2018 at 9:39 pm to Dr. Trinidad Curet , who verbally acknowledged these results. Electronically Signed   By: Constance Holster M.D.   On: 07/22/2018 21:43   Dg Chest Port 1 View  Result Date: 07/23/2018 CLINICAL DATA:  Status post PICC line placement EXAM: PORTABLE CHEST 1 VIEW COMPARISON:  07/23/2018 FINDINGS: The right-sided PICC line is well positioned with the tip terminating near the cavoatrial junction. Endotracheal tube terminates above the carina. The right IJ line terminates in the right axilla. There is no pneumothorax. A left basilar airspace opacity is noted. The enteric tube terminates below the left hemidiaphragm. The stomach is distended with contrast. IMPRESSION: 1. Well-positioned right-sided PICC line. 2. No significant interval change in malpositioned right-sided IJ line. 3. Endotracheal tube as above. 4. Persistent left basilar airspace opacity Electronically Signed   By: Constance Holster M.D.   On: 07/23/2018 19:02   Dg Chest Port 1 View  Result Date: 07/23/2018 CLINICAL DATA:  Status post intubation. Status post laparotomy and lysis of adhesions. Small bowel resections. EXAM: PORTABLE CHEST 1 VIEW COMPARISON:  04/21/2018 FINDINGS: ET tube tip is above the carina. There is a right IJ catheter which projects over the right lung apex and is presumably within the right subclavian vein and is oriented distally. There is a enteric tube with tip well below the GE junction. Enteric contrast material opacifies the distended gastric lumen. Normal heart size. Aortic atherosclerosis. Asymmetric elevation of left hemidiaphragm with subsegmental atelectasis. IMPRESSION: 1. The right IJ catheter tip is in the expected location of the right subclavian vein and is oriented distally  towards the right upper extremity. 2. Satisfactory position of enteric tube and ET tube. 3. Asymmetric elevation of left hemidiaphragm with mild subsegmental atelectasis. 4. These results will be called to the ordering clinician or representative by the Radiologist Assistant, and communication documented in the PACS or zVision Dashboard. Electronically Signed   By: Kerby Moors M.D.   On: 07/23/2018 13:25   Dg Abd Portable 1v-small Bowel Obstruction Protocol-initial, 8 Hr Delay  Result Date: 07/23/2018 CLINICAL DATA:  Follow up small bowel obstruction EXAM: PORTABLE ABDOMEN - 1 VIEW COMPARISON:  CT from 07/22/2018, plain film from 07/22/2018 FINDINGS: Gastric catheter is noted within the stomach. The gastric fundus is filled with contrast. No significant contrast is seen in the more distal small bowel loops. Mild small bowel dilatation is again noted. Contrast is seen within the renal collecting system and bladder. No acute bony abnormality is noted. IMPRESSION: 8 hour follow-up film demonstrates the majority of contrast material to be within the fundus of the stomach. Right-side-down positioning may be helpful to allow contrast to pass into the small bowel for further evaluation. Electronically Signed  By: Inez Catalina M.D.   On: 07/23/2018 07:12   Dg Abd Portable 1v-small Bowel Protocol-position Verification  Result Date: 07/22/2018 CLINICAL DATA:  NG tube placement EXAM: PORTABLE ABDOMEN - 1 VIEW COMPARISON:  None. FINDINGS: The tip of the NG tube projects over the gastric body/fundus. The visualized bowel gas pattern is nonspecific. IMPRESSION: NG tube tip projects over the gastric fundus/body. Electronically Signed   By: Constance Holster M.D.   On: 07/22/2018 22:01   [place] Ekg Site Rite  Result Date: 07/23/2018 If Site Rite image not attached, placement could not be confirmed due to current cardiac rhythm.  Results/Tests Pending at Time of Discharge: none  Discharge Medications:  Allergies as  of 08/12/2018      Reactions   Anesthesia S-i-60 Nausea And Vomiting   "with hysterectomy years back had post op N/V"      Medication List    TAKE these medications   acetaminophen 500 MG tablet Commonly known as:  TYLENOL Take 500 mg by mouth every 6 (six) hours as needed for mild pain.   amiodarone 200 MG tablet Commonly known as:  PACERONE Take 1 tablet (200 mg total) twice daily for one month.  Then reduce and take 1 tablet daily   apixaban 5 MG Tabs tablet Commonly known as:  ELIQUIS Take 1 tablet (5 mg total) by mouth 2 (two) times daily.   CALCIUM PO Take 1 tablet by mouth daily.   cholecalciferol 25 MCG (1000 UT) tablet Commonly known as:  VITAMIN D Take 2,000 Units by mouth daily with lunch.   diltiazem 180 MG 24 hr capsule Commonly known as:  CARDIZEM CD Take 1 capsule (180 mg total) by mouth daily.   feeding supplement (PRO-STAT SUGAR FREE 64) Liqd Take 30 mLs by mouth 2 (two) times daily.   levothyroxine 75 MCG tablet Commonly known as:  SYNTHROID Take 75 mcg by mouth daily before breakfast.   metoprolol tartrate 25 MG tablet Commonly known as:  LOPRESSOR Take 1 tablet (25 mg total) by mouth 2 (two) times daily.   multivitamin with minerals Tabs tablet Take 1 tablet by mouth daily.   Nitrostat 0.4 MG SL tablet Generic drug:  nitroGLYCERIN Place 1 tablet (0.4 mg total) under the tongue every 5 (five) minutes as needed. Chest pain   oxyCODONE 5 MG immediate release tablet Commonly known as:  Oxy IR/ROXICODONE Take 0.5-1 tablets (2.5-5 mg total) by mouth every 4 (four) hours as needed for up to 7 days for moderate pain or severe pain.   psyllium 95 % Pack Commonly known as:  HYDROCIL/METAMUCIL Take 1 packet by mouth 2 (two) times daily.   rosuvastatin 10 MG tablet Commonly known as:  CRESTOR Take 1 tablet (10 mg total) by mouth daily.   Systane Ultra 0.4-0.3 % Soln Generic drug:  Polyethyl Glycol-Propyl Glycol Place 1-2 drops into both eyes 3  (three) times daily as needed (dry eyes).   ZANTAC PO Take 1 tablet by mouth daily as needed (heartbrun).            Durable Medical Equipment  (From admission, onward)         Start     Ordered   08/10/18 1334  For home use only DME 3 n 1  Once     08/10/18 1335   07/30/18 1424  For home use only DME Walker rolling  Once    Comments:  5" wheels  Question:  Patient needs a walker to treat with the  following condition  Answer:  Dependent on walker for ambulation   07/30/18 1423          Discharge Instructions: Please refer to Patient Instructions section of EMR for full details.  Patient was counseled important signs and symptoms that should prompt return to medical care, changes in medications, dietary instructions, activity restrictions, and follow up appointments.   Follow-Up Appointments: Follow-up Information    Rolm Bookbinder, MD Follow up on 08/28/2018.   Specialty:  General Surgery Why:  at 10:30am. Please arrive 20 minutes prior to complete paperwork. Please bring photo ID and insurance card.  Contact information: [address] [place] [address] [phone]        Nahser, Wonda Cheng, MD. Go on 09/10/2018.   Specialty:  Cardiology Why:  at 9:00 am Contact information: [address] [place] [address] [phone]        Lajean Manes, MD. Schedule an appointment as soon as possible for a visit in 7 day(s).   Specialty:  Internal Medicine Contact information: [address] [place] [address] [phone]        Lajean Manes, MD. Go in 1 week(s).   Specialty:  Internal Medicine Contact information: [address] [place] [address] [phone]        [facility] [place] Follow up.   Why:  for home health services. they will be in contact in the next 1-2 days to make appointment to start services.  Contact information: [place]  [phone]           Cleophas Dunker, DO 08/12/2018, 12:34 PM PGY-1, [place]

## 2018-07-25 NOTE — Anesthesia Procedure Notes (Signed)
Date/Time: 07/25/2018 11:40 AM Performed by: Harden Mo, CRNA Pre-anesthesia Checklist: Patient identified, Emergency Drugs available, Suction available and Patient being monitored Patient Re-evaluated:Patient Re-evaluated prior to induction Oxygen Delivery Method: Circle system utilized Preoxygenation: Pre-oxygenation with 100% oxygen Induction Type: Inhalational induction with existing ETT Placement Confirmation: positive ETCO2 and breath sounds checked- equal and bilateral Dental Injury: Teeth and Oropharynx as per pre-operative assessment

## 2018-07-25 NOTE — Transfer of Care (Signed)
Immediate Anesthesia Transfer of Care Note  Patient: RAYVON BRANDVOLD  Procedure(s) Performed: Ileocecetomy (Abdomen) Complex Wound Closure (Abdomen)  Patient Location: ICU  Anesthesia Type:General  Level of Consciousness: sedated and Patient remains intubated per anesthesia plan  Airway & Oxygen Therapy: Patient remains intubated per anesthesia plan and Patient placed on Ventilator (see vital sign flow sheet for setting)  Post-op Assessment: Report given to RN and Post -op Vital signs reviewed and stable  Post vital signs: Reviewed and stable  Last Vitals:  Vitals Value Taken Time  BP    Temp    Pulse    Resp    SpO2      Last Pain:  Vitals:   07/25/18 0804  TempSrc: Axillary      Patients Stated Pain Goal: 2 (44/58/48 3507)  Complications: No apparent anesthesia complications

## 2018-07-25 NOTE — Progress Notes (Signed)
2 Days Post-Op   Subjective/Chief Complaint: Sedated on vent, awakes follows commands, no pressors   Objective: Vital signs in last 24 hours: Temp:  [97.4 F (36.3 C)-99.4 F (37.4 C)] 97.6 F (36.4 C) (05/21 0436) Pulse Rate:  [82-120] 115 (05/21 0700) Resp:  [16-25] 16 (05/21 0700) BP: (127-170)/(81-127) 144/101 (05/21 0700) SpO2:  [100 %] 100 % (05/21 0700) Arterial Line BP: (110-170)/(77-108) 145/85 (05/21 0700) FiO2 (%):  [40 %] 40 % (05/21 0429) Weight:  [69.7 kg] 69.7 kg (05/21 0442)    Intake/Output from previous day: 05/20 0701 - 05/21 0700 In: 2735.1 [I.V.:2615.1; NG/GT:120] Out: 1160 [Urine:610; Emesis/NG output:50; Drains:500] Intake/Output this shift: No intake/output data recorded.  GI: soft vac in place with expected output  Lab Results:  Recent Labs    07/24/18 0353 07/25/18 0010  WBC 12.6* 11.0*  HGB 14.4 13.3  HCT 43.4 39.6  PLT 244 224   BMET Recent Labs    07/24/18 0353 07/25/18 0318  NA 134* 135  K 4.6 4.4  CL 108 109  CO2 18* 19*  GLUCOSE 146* 128*  BUN 21 20  CREATININE 0.83 0.62  CALCIUM 8.2* 8.0*   PT/INR Recent Labs    07/23/18 0236  LABPROT 14.3  INR 1.1   ABG Recent Labs    07/23/18 1121 07/23/18 1257  PHART 7.250* 7.334*  HCO3 19.1* 17.0*    Studies/Results: Portable Chest Xray  Result Date: 07/24/2018 CLINICAL DATA:  Check endotracheal tube placement EXAM: PORTABLE CHEST 1 VIEW COMPARISON:  07/23/2018 FINDINGS: Endotracheal tube and gastric catheter are noted extending into the stomach. Right-sided PICC line is noted at the cavoatrial junction in satisfactory position. Cardiac shadow is within normal limits. The lungs are well aerated with minimal left basilar atelectasis. The right jugular central line has been removed. IMPRESSION: Tubes and lines as described above stable with the exception of interval removal of the right jugular line. Mild left basilar atelectasis. Electronically Signed   By: Inez Catalina M.D.    On: 07/24/2018 07:59   Dg Chest Port 1 View  Result Date: 07/23/2018 CLINICAL DATA:  Status post PICC line placement EXAM: PORTABLE CHEST 1 VIEW COMPARISON:  07/23/2018 FINDINGS: The right-sided PICC line is well positioned with the tip terminating near the cavoatrial junction. Endotracheal tube terminates above the carina. The right IJ line terminates in the right axilla. There is no pneumothorax. A left basilar airspace opacity is noted. The enteric tube terminates below the left hemidiaphragm. The stomach is distended with contrast. IMPRESSION: 1. Well-positioned right-sided PICC line. 2. No significant interval change in malpositioned right-sided IJ line. 3. Endotracheal tube as above. 4. Persistent left basilar airspace opacity Electronically Signed   By: Constance Holster M.D.   On: 07/23/2018 19:02   Dg Chest Port 1 View  Result Date: 07/23/2018 CLINICAL DATA:  Status post intubation. Status post laparotomy and lysis of adhesions. Small bowel resections. EXAM: PORTABLE CHEST 1 VIEW COMPARISON:  04/21/2018 FINDINGS: ET tube tip is above the carina. There is a right IJ catheter which projects over the right lung apex and is presumably within the right subclavian vein and is oriented distally. There is a enteric tube with tip well below the GE junction. Enteric contrast material opacifies the distended gastric lumen. Normal heart size. Aortic atherosclerosis. Asymmetric elevation of left hemidiaphragm with subsegmental atelectasis. IMPRESSION: 1. The right IJ catheter tip is in the expected location of the right subclavian vein and is oriented distally towards the right upper extremity. 2.  Satisfactory position of enteric tube and ET tube. 3. Asymmetric elevation of left hemidiaphragm with mild subsegmental atelectasis. 4. These results will be called to the ordering clinician or representative by the Radiologist Assistant, and communication documented in the PACS or zVision Dashboard. Electronically  Signed   By: Kerby Moors M.D.   On: 07/23/2018 13:25   [place] Ekg Site Rite  Result Date: 07/23/2018 If Site Rite image not attached, placement could not be confirmed due to current cardiac rhythm.   Anti-infectives: Anti-infectives (From admission, onward)   Start     Dose/Rate Route Frequency Ordered Stop   07/25/18 0745  ceFAZolin (ANCEF) IVPB 2g/100 mL premix     2 g 200 mL/hr over 30 Minutes Intravenous  Once 07/25/18 0735     07/23/18 1000  cefoTEtan (CEFOTAN) 1 g in sodium chloride 0.9 % 100 mL IVPB     1 g 200 mL/hr over 30 Minutes Intravenous  Once 07/23/18 0825 07/23/18 1117      Assessment/Plan: POD 2 elap, sbr, open abdomen-Rachel Park -npo, ng tube -physiologically returning to normal, plan takeback today with anastomosis and abdominal closure planned -if closed hopefully extubated later today Afib -hold anticoagulation   Rolm Bookbinder 07/25/2018

## 2018-07-26 ENCOUNTER — Encounter (HOSPITAL_COMMUNITY): Payer: Self-pay | Admitting: General Surgery

## 2018-07-26 ENCOUNTER — Inpatient Hospital Stay (HOSPITAL_COMMUNITY): Payer: Medicare HMO

## 2018-07-26 LAB — BASIC METABOLIC PANEL
Anion gap: 9 (ref 5–15)
BUN: 20 mg/dL (ref 8–23)
CO2: 17 mmol/L — ABNORMAL LOW (ref 22–32)
Calcium: 7.8 mg/dL — ABNORMAL LOW (ref 8.9–10.3)
Chloride: 113 mmol/L — ABNORMAL HIGH (ref 98–111)
Creatinine, Ser: 0.65 mg/dL (ref 0.44–1.00)
GFR calc Af Amer: >60 mL/min (ref >60)
GFR calc non Af Amer: >60 mL/min (ref >60)
Glucose, Bld: 108 mg/dL — ABNORMAL HIGH (ref 70–99)
Potassium: 3.6 mmol/L (ref 3.5–5.1)
Sodium: 139 mmol/L (ref 135–145)

## 2018-07-26 LAB — CBC
HCT: 34.1 % — ABNORMAL LOW (ref 36.0–46.0)
Hemoglobin: 11.5 g/dL — ABNORMAL LOW (ref 12.0–15.0)
MCH: 30.9 pg (ref 26.0–34.0)
MCHC: 33.7 g/dL (ref 30.0–36.0)
MCV: 91.7 fL (ref 80.0–100.0)
Platelets: 179 10*3/uL (ref 150–400)
RBC: 3.72 MIL/uL — ABNORMAL LOW (ref 3.87–5.11)
RDW: 15.4 % (ref 11.5–15.5)
WBC: 8.6 10*3/uL (ref 4.0–10.5)
nRBC: 0 % (ref 0.0–0.2)

## 2018-07-26 LAB — GLUCOSE, CAPILLARY
Glucose-Capillary: 109 mg/dL — ABNORMAL HIGH (ref 70–99)
Glucose-Capillary: 120 mg/dL — ABNORMAL HIGH (ref 70–99)
Glucose-Capillary: 142 mg/dL — ABNORMAL HIGH (ref 70–99)
Glucose-Capillary: 126 mg/dL — ABNORMAL HIGH (ref 70–99)

## 2018-07-26 LAB — MAGNESIUM: Magnesium: 1.9 mg/dL (ref 1.7–2.4)

## 2018-07-26 LAB — PHOSPHORUS: Phosphorus: 2.4 mg/dL — ABNORMAL LOW (ref 2.5–4.6)

## 2018-07-26 LAB — TRIGLYCERIDES: Triglycerides: 82 mg/dL (ref <150)

## 2018-07-26 MED ORDER — MAGNESIUM SULFATE 2 GM/50ML IV SOLN
2.0000 g | Freq: Once | INTRAVENOUS | Status: AC
  Administered 2018-07-26: 09:00:00 2 g via INTRAVENOUS
  Filled 2018-07-26: qty 50

## 2018-07-26 MED ORDER — ORAL CARE MOUTH RINSE
15.0000 mL | Freq: Two times a day (BID) | OROMUCOSAL | Status: DC
  Administered 2018-07-26 – 2018-08-11 (×19): 15 mL via OROMUCOSAL

## 2018-07-26 MED ORDER — FUROSEMIDE 10 MG/ML IJ SOLN
20.0000 mg | Freq: Once | INTRAMUSCULAR | Status: AC
  Administered 2018-07-26: 20 mg via INTRAVENOUS
  Filled 2018-07-26: qty 2

## 2018-07-26 MED ORDER — SODIUM CHLORIDE 0.9 % IV SOLN
INTRAVENOUS | Status: DC
  Administered 2018-07-26 – 2018-07-27 (×3): via INTRAVENOUS

## 2018-07-26 MED ORDER — POTASSIUM PHOSPHATES 15 MMOLE/5ML IV SOLN
10.0000 mmol | Freq: Once | INTRAVENOUS | Status: AC
  Administered 2018-07-26: 10 mmol via INTRAVENOUS
  Filled 2018-07-26: qty 3.33

## 2018-07-26 MED ORDER — POTASSIUM CHLORIDE 20 MEQ/15ML (10%) PO SOLN
20.0000 meq | Freq: Once | ORAL | Status: AC
  Administered 2018-07-26: 11:00:00 20 meq
  Filled 2018-07-26: qty 15

## 2018-07-26 MED ORDER — CHLORHEXIDINE GLUCONATE 0.12 % MT SOLN
15.0000 mL | Freq: Two times a day (BID) | OROMUCOSAL | Status: DC
  Administered 2018-07-26 – 2018-08-12 (×30): 15 mL via OROMUCOSAL
  Filled 2018-07-26 (×32): qty 15

## 2018-07-26 MED ORDER — INSULIN ASPART 100 UNIT/ML ~~LOC~~ SOLN
0.0000 [IU] | SUBCUTANEOUS | Status: DC
  Administered 2018-07-26 – 2018-07-30 (×11): 1 [IU] via SUBCUTANEOUS

## 2018-07-26 MED ORDER — TRAVASOL 10 % IV SOLN
INTRAVENOUS | Status: AC
  Administered 2018-07-26: 18:00:00 via INTRAVENOUS
  Filled 2018-07-26: qty 484.56

## 2018-07-26 NOTE — Progress Notes (Signed)
Family medicine team continues to follow socially.  Appreciate excellent care provided by CCM and surgery.  Plan to resume care for patient when transferred to floor.  [place] Constable, D.O.  PGY-1 Family Medicine  07/26/2018 7:15 AM

## 2018-07-26 NOTE — Progress Notes (Signed)
Physical Therapy Treatment Patient Details Name: Rachel Park MRN: [id number] DOB: [birth year] Today's Date: 07/26/2018    History of Present Illness 72 yof with SBO, failed medical management, taken to OR with findings of necrotic bowel s/p resection.  Abd left open with wound vac and returns to ICU on mechanical ventilation. Extubated 07/25/18    PT Comments    Pt agreeable to ambulation today, reporting she walks every morning in her neighborhood. Pt is limited in safe mobility by fatigue and related decreased strength and endurance. Pt requires minAx2 for transfers and min-modAx2 for ambulation of a total of 50 feet. 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 PT will continue to follow acutely to progress mobility .     Follow Up Recommendations  CIR     Equipment Recommendations  Other (comment)(TBA)       Precautions / Restrictions Precautions Precautions: Fall Precaution Comments: NG tube Restrictions Weight Bearing Restrictions: No    Mobility  Bed Mobility               General bed mobility comments: OOB in reclinter  Transfers Overall transfer level: Needs assistance Equipment used: Rolling walker (2 wheeled)(Eva walker) Transfers: Sit to/from Stand Sit to Stand: Min assist;+2 physical assistance;+2 safety/equipment         General transfer comment: minAx2 for sit>stand, vc for scooting hips forward in bed, for use of armrests to powerup, and upright posture and posterior pelvic tilt  Ambulation/Gait Ambulation/Gait assistance: Min assist;Mod assist;+2 physical assistance;+2 safety/equipment Gait Distance (Feet): 50 Feet(1x10, 2x20) Assistive device: Rolling walker (2 wheeled)(Eva walker) Gait Pattern/deviations: Step-through pattern;Decreased step length - right;Decreased step length - left;Trunk flexed;Narrow base of support;Antalgic;Shuffle Gait velocity: slowed Gait velocity interpretation:  <1.8 ft/sec, indicate of risk for recurrent falls General Gait Details: intitally utilized RW, pt with poor proximity and flexed posture with use, switched to EVA walker pt with increased stability in upright posture, pt continues to be min A progressing to modA as she fatigues, RN provided assist for lines/leads and close chair follow         Balance Overall balance assessment: Needs assistance Sitting-balance support: No upper extremity supported;Feet supported Sitting balance-Leahy Scale: Fair     Standing balance support: Bilateral upper extremity supported Standing balance-Leahy Scale: Poor Standing balance comment: requires UE support and outside assist                            Cognition Arousal/Alertness: Awake/alert Behavior During Therapy: Flat affect Overall Cognitive Status: Difficult to assess                                 General Comments: Pt appropriate, increased processing time, requires verbal and tactile cuing         General Comments General comments (skin integrity, edema, etc.): with activity SaO2 on RA dropped to 88%O2, however quickly rebounded with rest and pursed lipped breathing, max HR 118 bpm with activity       Pertinent Vitals/Pain Pain Assessment: Faces Faces Pain Scale: Hurts little more Pain Location: abdomen Pain Descriptors / Indicators: Aching;Sore;Grimacing           PT Goals (current goals can now be found in the care plan section) Acute Rehab PT Goals Patient Stated Goal: unable to state due to intubation PT Goal  Formulation: With patient Time For Goal Achievement: 08/08/18 Potential to Achieve Goals: Good Progress towards PT goals: Progressing toward goals    Frequency    Min 3X/week      PT Plan Current plan remains appropriate       AM-PAC PT "6 Clicks" Mobility   Outcome Measure  Help needed turning from your back to your side while in a flat bed without using bedrails?: A Lot Help  needed moving from lying on your back to sitting on the side of a flat bed without using bedrails?: A Lot Help needed moving to and from a bed to a chair (including a wheelchair)?: A Lot Help needed standing up from a chair using your arms (e.g., wheelchair or bedside chair)?: A Lot Help needed to walk in hospital room?: Total Help needed climbing 3-5 steps with a railing? : Total 6 Click Score: 10    End of Session   Activity Tolerance: Patient tolerated treatment well Patient left: with call bell/phone within reach;in chair;with nursing/sitter in room Nurse Communication: Mobility status PT Visit Diagnosis: Muscle weakness (generalized) (M62.81)     Time: 2409-7353 PT Time Calculation (min) (ACUTE ONLY): 25 min  Charges:  $Gait Training: 23-37 mins                     Madex Seals B. Migdalia Dk PT, DPT Acute Rehabilitation Services Pager [phone] Office [phone]    [place] 07/26/2018, 3:08 PM

## 2018-07-26 NOTE — Progress Notes (Signed)
NAME:  Rachel Park, MRN:  [id number], DOB:  1931-04-08, LOS: 4 ADMISSION DATE:  07/22/2018, CONSULTATION DATE:  07/23/2018 REFERRING MD:  Dr. Donne Hazel, CHIEF COMPLAINT:  SBO/ Ischemic bowel  Brief History   18 yof with SBO, failed medical management, taken to OR with findings of necrotic bowel s/p resection.  Abd left open with wound vac and returns to ICU on mechanical ventilation.   History of present illness   HPI obtained from chart review as patient is sedated on mechanical ventilation.   85 year old female with history of small bowel obstruction, hernia with prior repair, Afib- on Eliquis, CVA, hypothyroidism, HTN, and CAD presenting with one day history of nausea and vomiting with epigastric pain 5/18. On admit, she had normal WBC with initial lactate of 2.5 improving to 1.2.  Her CT abdomen was concerning for internal hernia and ischemic bowel/ infarction.  Surgery was consulted and NGT placed in which she had resolution of pain, in addition to improving lactate and absence of acidosis was felt reassuring.  Therefore she was admitted to [facility] team.  Overnight, she had minimal NGT output with increasing pain and signs of peritonitis on exam and therefore taken to OR for exploratory laparotomy.  She was found to have necrotic bowel with resection.  Her abdomen was left open with wound vac in placed in anticipation for re-exploration in 48 hours.  She returns to ICU on mechanical ventilation in which PCCM was asked to assist with.    Past Medical History  Small bowel obstruction, hernia with prior repair, Afib- on Eliquis, CVA, hypothyroidism, HTN, CAD  Significant Hospital Events   5/18 Admit 5/19 OR  Consults:   Procedures:  5/19 ETT >> 5/19 left radial aline >> 5/19 R IJ CVL >>  5/19 OR- ex lap/ resection/ wound vac 5/21 OR -Abd wall closure , Ileocecetomy with ilecolonic anastomosis   Significant Diagnostic Tests:  5/18 CT abd >> 1. Dilated loops of small bowel  throughout the abdomen with extensive hypoenhancement, fat stranding, and swirling of the mesentery is highly concerning for an internal hernia with ischemic small bowel. Correlation with lactate and surgical consultation is recommended. There is no free air. There is a small amount of free fluid in the upper abdomen. 2. The SMA is widely patent. 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 3. 1.6 cm hyperattenuating wedge-shaped area in hepatic segment 4A, new from prior study. This is favored to represent a flash hemangioma or THAD. Follow-up with nonemergent outpatient ultrasound is recommended. 4. Cardiomegaly with interlobular septal thickening suggestive of developing volume overload. There is a trace right-sided pleural effusion. 5. Cortical thinning of the patient's right kidney consistent with evolving infarcts as demonstrated on prior CT from 04/21/2018. 6. Sigmoid diverticulosis without CT evidence of diverticulitis.  Micro Data:  5/18 COVID >> neg 5/19 MRSA PCR >> neg  Antimicrobials:  5/19 cefotetan x 1 dose  Ancef 5/21 x 1 preop   Interim history/subjective:  Return to the OR 5/21 for abdominal wall closure. OR notes showed X chemic terminal ileum requiring ileocecectomy, ileostomy anastomosis Postop return to the ICU attempted to wean but unable due to increased pain Weaning well with this am, good volumes, Sats good .  Did have A. fib with RVR overnight that improved with metoprolol Awake and following commands +7L I/O Bal since admit   Objective   Blood pressure (!) 145/71, pulse (!) 119, temperature 99.3 F (37.4 C), temperature source  Oral, resp. rate (!) 23, height 5' 2.5" (1.588 m), weight 70.7 kg, SpO2 97 %.    Vent Mode: CPAP;PSV FiO2 (%):  [40 %] 40 % Set Rate:  [16 bmp] 16 bmp Vt Set:  [410 mL] 410 mL PEEP:  [5 cmH20] 5 cmH20 Pressure Support:  [10 cmH20] 10 cmH20 Plateau Pressure:  [15 cmH20-16 cmH20] 15 cmH20    Intake/Output Summary (Last 24 hours) at 07/26/2018 5035 Last data filed at 07/26/2018 4656 Gross per 24 hour  Intake 3009.77 ml  Output 1350 ml  Net 1659.77 ml   Filed Weights   07/24/18 0338 07/25/18 0442 07/26/18 0339  Weight: 68.8 kg 69.7 kg 70.7 kg   Examination: General: Elderly female intubated on the vent, no acute distress  HEENT: MMM, NCAT, ETT    Neuro: Alert, follows commands, moves all extremities x4   CV: Sinus tach, no MRG, positive general edema   PULM: Decreased breath sounds in the bases  Abdomen: Dressing in place Extremities: Warm, dry, positive trace to 1+ general edema  Skin: Intact, no rash, abdominal dressing in place clean and dry     Resolved Hospital Problem list    Assessment & Plan:   SBO with ischemic bowel s/p ex lap with resection, open abdomen with wound vac Return to the OR 07/25/2018 with abdominal wall closure, ileocecectomy/ileocolonic anastomosis. P:  Post op management per CCS NGT NPO except meds and clamp   Acute respiratory insufficiency in the post-operative setting 5/22-weaning well today, chest x-ray with increased bibasilar atelectasis/effusions questionable edema P:  Adult vent protocol  Wean as tolerated Stable on PRVC 8 cc/kg  VAP ppx  PAD protocol  Sedation with fent As needed   Evaluate for extubation today  Lasix 20 mg x 1  Decrease IVF 50cc/h   Hypotension,-resolved  HTN  Hypomagnesium   P:  Cont to follow b/p Replace Mg+    Atrial Fib Hx HTN - on amiodarone, metoprolol, diltiazem, and Eliquis at hom  P:  Lopressor as needed Restart AC per CCS following surgery   Hypothyroidism P:  IV synthroid   Best practice:  Diet: NPO Pain/Anxiety/Delirium protocol (if indicated): prn fentanyl VAP protocol (if indicated): yes DVT prophylaxis: SCDs GI prophylaxis: PPI Glucose control: CBG q 4, add SSI if > 180 Mobility: BR Code Status: Full  Family Communication:  Disposition: ICU  Labs   CBC: Recent  Labs  Lab 07/23/18 0236  07/23/18 1257 07/23/18 1432 07/24/18 0353 07/25/18 0010 07/26/18 0332  WBC 10.9*  --   --  15.4* 12.6* 11.0* 8.6  HGB 15.5*   < > 13.9 14.6 14.4 13.3 11.5*  HCT 45.1   < > 41.0 43.3 43.4 39.6 34.1*  MCV 89.5  --   --  92.1 91.0 90.8 91.7  PLT 270  --   --  231 244 224 179   < > = values in this interval not displayed.    Basic Metabolic Panel: Recent Labs  Lab 07/23/18 0236  07/23/18 1257 07/23/18 1432 07/24/18 0353 07/25/18 0318 07/26/18 0332  NA 133*   < > 137 135 134* 135 139  K 5.0   < > 4.1 4.2 4.6 4.4 3.6  CL 100  --   --  108 108 109 113*  CO2 20*  --   --  19* 18* 19* 17*  GLUCOSE 160*  --   --  160* 146* 128* 108*  BUN 16  --   --  20 21 20  20  CREATININE 0.74  --   --  0.86 0.83 0.62 0.65  CALCIUM 9.6  --   --  7.9* 8.2* 8.0* 7.8*  MG  --   --   --   --  1.8  --  1.9  PHOS  --   --   --   --  3.9  --  2.4*   < > = values in this interval not displayed.   GFR: Estimated Creatinine Clearance: 48.8 mL/min (by C-G formula based on SCr of 0.65 mg/dL). Recent Labs  Lab 07/22/18 1929 07/22/18 2210 07/22/18 2240  07/23/18 1432 07/24/18 0353 07/25/18 0010 07/26/18 0332  WBC  --   --   --    < > 15.4* 12.6* 11.0* 8.6  LATICACIDVEN 2.5* 1.2 1.2  --   --   --   --   --    < > = values in this interval not displayed.    Liver Function Tests: Recent Labs  Lab 07/22/18 1903 07/24/18 0353  AST 38  --   ALT 42  --   ALKPHOS 104  --   BILITOT 0.8  --   PROT 7.4  --   ALBUMIN 4.3 3.0*   Recent Labs  Lab 07/22/18 1903  LIPASE 27   No results for input(s): AMMONIA in the last 168 hours.  ABG    Component Value Date/Time   PHART 7.334 (L) 07/23/2018 1257   PCO2ART 31.9 (L) 07/23/2018 1257   PO2ART 132.0 (H) 07/23/2018 1257   HCO3 17.0 (L) 07/23/2018 1257   TCO2 18 (L) 07/23/2018 1257   ACIDBASEDEF 8.0 (H) 07/23/2018 1257   O2SAT 99.0 07/23/2018 1257     Coagulation Profile: Recent Labs  Lab 07/23/18 0236  INR 1.1     Cardiac Enzymes: No results for input(s): CKTOTAL, CKMB, CKMBINDEX, TROPONINI in the last 168 hours.  HbA1C: Hgb A1c MFr Bld  Date/Time Value Ref Range Status  07/23/2018 02:36 AM 5.8 (H) 4.8 - 5.6 % Final    Comment:    (NOTE) Pre diabetes:          5.7%-6.4% Diabetes:              >6.4% Glycemic control for   <7.0% adults with diabetes   12/25/2014 03:08 AM 5.4 4.8 - 5.6 % Final    Comment:    (NOTE)         Pre-diabetes: 5.7 - 6.4         Diabetes: >6.4         Glycemic control for adults with diabetes: <7.0     CBG: Recent Labs  Lab 07/25/18 0013 07/25/18 0407 07/25/18 0726 07/25/18 1619  GLUCAP 104* 103* 118* 96    Review of Systems:   unable  Past Medical History  She,  has a past medical history of Anginal pain ([place]), Arthritis, Complication of anesthesia, Coronary artery disease, Dyspnea, Hypertension, Hypothyroidism, PAF (paroxysmal atrial fibrillation) ([place]), PONV (postoperative nausea and vomiting), Renal infarction ([place]), Small bowel obstruction ([place]) (06/2015), Stroke ([place]), TIA (transient ischemic attack) (03/24/2015), Vertigo, benign positional, and Wears glasses.   Surgical History    Past Surgical History:  Procedure Laterality Date  . ABDOMINAL HYSTERECTOMY    . APPLICATION OF WOUND VAC N/A 07/23/2018   Procedure: APPLICATION OF WOUND VAC;  Surgeon: Rolm Bookbinder, MD;  Location: [place];  Service: General;  Laterality: N/A;  . BOWEL RESECTION N/A 07/23/2018   Procedure: SMALL BOWEL RESECTION;  Surgeon: Rolm Bookbinder, MD;  Location: [place] OR;  Service: General;  Laterality: N/A;  . CARDIAC CATHETERIZATION N/A 12/24/2014   Procedure: Left Heart Cath and Coronary Angiography;  Surgeon: Sherren Mocha, MD;  Location: [place] CV LAB;  Service: Cardiovascular;  Laterality: N/A;  . CARDIOVERSION N/A 05/22/2018   Procedure: CARDIOVERSION;  Surgeon: Josue Hector, MD;  Location: [facility] ENDOSCOPY;  Service: Cardiovascular;  Laterality: N/A;  .  CATARACT EXTRACTION Bilateral   . COLONOSCOPY WITH PROPOFOL N/A 12/01/2013   Procedure: COLONOSCOPY WITH PROPOFOL;  Surgeon: Garlan Fair, MD;  Location: [facility] ENDOSCOPY;  Service: Endoscopy;  Laterality: N/A;  . INCISIONAL HERNIA REPAIR N/A 03/10/2016   Procedure: INCISIONAL HERNIA REPAIR TIMES TWO;  Surgeon: Armandina Gemma, MD;  Location: [place];  Service: General;  Laterality: N/A;  . INGUINAL HERNIA REPAIR Bilateral 03/10/2016   Procedure: BILATERAL INGUINAL HERNIA REPAIRS;  Surgeon: Armandina Gemma, MD;  Location: [place];  Service: General;  Laterality: Bilateral;  . INSERTION OF MESH N/A 03/10/2016   Procedure: INSERTION OF MESH TO BILATERAL GROINS AND ABDOMEN;  Surgeon: Armandina Gemma, MD;  Location: [place];  Service: General;  Laterality: N/A;  . laparotomy  06/2015  . LAPAROTOMY N/A 07/23/2018   Procedure: EXPLORATORY LAPAROTOMY;  Surgeon: Rolm Bookbinder, MD;  Location: [place];  Service: General;  Laterality: N/A;  . LEFT HEART CATH AND CORONARY ANGIOGRAPHY N/A 09/27/2016   Procedure: Left Heart Cath and Coronary Angiography;  Surgeon: Sherren Mocha, MD;  Location: [place] CV LAB;  Service: Cardiovascular;  Laterality: N/A;  . TONSILLECTOMY       Social History   reports that she has never smoked. She has never used smokeless tobacco. She reports previous alcohol use. She reports that she does not use drugs.   Family History   Her family history includes Cancer in her sister; Heart attack in her father; Stroke in her mother.   Allergies Allergies  Allergen Reactions  . Anesthesia S-I-60 Nausea And Vomiting    "with hysterectomy years back had post op N/V"     Home Medications  Prior to Admission medications   Medication Sig Start Date End Date Taking? Authorizing Provider  acetaminophen (TYLENOL) 500 MG tablet Take 500 mg by mouth every 6 (six) hours as needed for mild pain.    Yes [provider]  amiodarone (PACERONE) 200 MG tablet Take 1 tablet (200 mg total) twice daily  for one month.  Then reduce and take 1 tablet daily 06/24/18  Yes Nahser, Wonda Cheng, MD  apixaban (ELIQUIS) 5 MG TABS tablet Take 1 tablet (5 mg total) by mouth 2 (two) times daily. 06/24/18  Yes Nahser, Wonda Cheng, MD  CALCIUM PO Take 1 tablet by mouth daily.   Yes [provider]  cholecalciferol (VITAMIN D) 1000 UNITS tablet Take 2,000 Units by mouth daily with lunch.    Yes [provider]  diltiazem (CARDIZEM CD) 180 MG 24 hr capsule Take 1 capsule (180 mg total) by mouth daily. 06/24/18  Yes Nahser, Wonda Cheng, MD  levothyroxine (SYNTHROID, LEVOTHROID) 75 MCG tablet Take 75 mcg by mouth daily before breakfast.   Yes [provider]  metoprolol tartrate (LOPRESSOR) 25 MG tablet Take 1 tablet (25 mg total) by mouth 2 (two) times daily. 06/24/18  Yes Nahser, Wonda Cheng, MD  NITROSTAT 0.4 MG SL tablet Place 1 tablet (0.4 mg total) under the tongue every 5 (five) minutes as needed. Chest pain 06/24/18  Yes Nahser, Wonda Cheng, MD  Polyethyl Glycol-Propyl Glycol (SYSTANE ULTRA) 0.4-0.3 %  SOLN Place 1-2 drops into both eyes 3 (three) times daily as needed (dry eyes).   Yes [provider]  raNITIdine HCl (ZANTAC PO) Take 1 tablet by mouth daily as needed (heartbrun).   Yes [provider]  rosuvastatin (CRESTOR) 10 MG tablet Take 1 tablet (10 mg total) by mouth daily. 06/24/18  Yes Nahser, Wonda Cheng, MD       [facility] 07/26/2018 8:23 AM  On call pager: [phone]

## 2018-07-26 NOTE — Progress Notes (Addendum)
[facility] CONSULT NOTE   Pharmacy Consult for TPN Indication: Prolonged ileus  Patient Measurements: Height: 5' 2.5" (158.8 cm) Weight: 155 lb 13.8 oz (70.7 kg) IBW/kg (Calculated) : 51.25 TPN AdjBW (KG): 54.7 Body mass index is 28.05 kg/m. Usual Weight: 63-69kg  Assessment: Rachel Park is an 85yo female admitted with hx of SBO with N/V and epigastric pain on 5/18. Later taken to OR for exploratory laparotomy and found to have necrotic bowel with resection with open abdomen followed by closure/ileocecectomy/ileocolonic anastomosis. Surgery consulted pharmacy for TPN as would like patient to have bowel rest and expects prolonged ileus.  GI: s/p bowel resection with expectation of prolonged ileus as above Endo: BG 90-160 now with SSI ordered Insulin requirements in the past 24 hours: 0 units Lytes: Na 139, K 3.6, coCa low at 8.3, phos low at 2.4, Mg 1.9, TG 82 -now s/p 2g Mg and K 60mEq x1 Renal:SCr 0.65 (baselines) Pulm: vent fio2 40 PEEP 5 Cards: BP 120s/60s with HR tachy 110-120s Hepatobil:t bili 0.8, AST/ALT WNL Neuro: nml ID: periop abx  TPN Access: PICC line placed 5/19 TPN start date: 5/22 Nutritional Goals (per RD recommendation on 07/24/2018):  KCal: 1408 kcal Protein: 85-105 grams Fluid: >/= 1.4L/day  Goal TPN rate is 56 ml/hr (provides 90g protein and 306 g dextrose)  Current Nutrition:   Plan:  Initiate TPN at 30 mL/hr. Hold 20% lipid emulsion for first 7 days for ICU patients per ASPEN guidelines (Start date 08/02/2018) This TPN provides 48.5 g of protein, 115.2 g of dextrose which provides 585.5 kCals per day, meeting ~40% of patient needs Electrolytes in TPN: standard -Add 61mmol Kphos today -Change NS infusion to 19mL/hr at 1800 today to meet full fluid needs Add MVI, trace elements MWF to TPN SSI and adjust as needed Monitor TPN labs tomorrow morning  Thank you for involving pharmacy in this patient's care.  Janae Bridgeman, PharmD PGY1 Pharmacy Resident Phone: [phone] 07/26/2018 10:59 AM   _________________________________  ADDENDUM:  After extubation, RD recalculated patient's nutritional requirements.   Kcal: 1500-1700 Protein: 85-105 grams Fluid: >/= 1.5 L  Spoke with RD and will factor these changes into tomorrow's TPN order.  Janae Bridgeman, PharmD PGY1 Pharmacy Resident Phone: [phone] 07/26/2018 12:45 PM

## 2018-07-26 NOTE — Progress Notes (Signed)
1 Day Post-Op   Subjective/Chief Complaint: Awake alert onvent, no events overnight   Objective: Vital signs in last 24 hours: Temp:  [97.5 F (36.4 C)-99.8 F (37.7 C)] 97.7 F (36.5 C) (05/22 0405) Pulse Rate:  [104-136] 108 (05/22 0600) Resp:  [0-29] 16 (05/22 0600) BP: (90-159)/(53-121) 107/72 (05/22 0600) SpO2:  [96 %-100 %] 99 % (05/22 0600) Arterial Line BP: (97-170)/(51-92) 144/73 (05/22 0600) FiO2 (%):  [40 %] 40 % (05/22 0405) Weight:  [70.7 kg] 70.7 kg (05/22 0339) Last BM Date: (UTA)  Intake/Output from previous day: 05/21 0701 - 05/22 0700 In: 3217.2 [I.V.:2867.2; NG/GT:100; IV Piggyback:250] Out: 1350 [Urine:475; Drains:155; Blood:20] Intake/Output this shift: No intake/output data recorded.  GI: few bs approp tender wound clean and open  Lab Results:  Recent Labs    07/25/18 0010 07/26/18 0332  WBC 11.0* 8.6  HGB 13.3 11.5*  HCT 39.6 34.1*  PLT 224 179   BMET Recent Labs    07/25/18 0318 07/26/18 0332  NA 135 139  K 4.4 3.6  CL 109 113*  CO2 19* 17*  GLUCOSE 128* 108*  BUN 20 20  CREATININE 0.62 0.65  CALCIUM 8.0* 7.8*   PT/INR No results for input(s): LABPROT, INR in the last 72 hours. ABG Recent Labs    07/23/18 1121 07/23/18 1257  PHART 7.250* 7.334*  HCO3 19.1* 17.0*    Studies/Results: Dg Or Local Abdomen  Result Date: 07/25/2018 CLINICAL DATA:  Incorrect needle count.  Bowel resection. EXAM: OR LOCAL ABDOMEN COMPARISON:  07/23/2018 FINDINGS: No retained instrument or needle identified. Image quality degraded by mild motion NG in the stomach. Staple line in the region of the cecum. Normal bowel gas pattern. IMPRESSION: No retained needle is present in the abdomen. These results were called by telephone at the time of interpretation on 07/25/2018 at 1:11 pm to [place] , who verbally acknowledged these results. Electronically Signed   By: Franchot Gallo M.D.   On: 07/25/2018 13:12    Anti-infectives: Anti-infectives (From  admission, onward)   Start     Dose/Rate Route Frequency Ordered Stop   07/25/18 1000  ceFAZolin (ANCEF) IVPB 2g/100 mL premix     2 g 200 mL/hr over 30 Minutes Intravenous To Short Stay 07/25/18 0749 07/25/18 1200   07/25/18 0745  ceFAZolin (ANCEF) IVPB 2g/100 mL premix  Status:  Discontinued     2 g 200 mL/hr over 30 Minutes Intravenous  Once 07/25/18 0735 07/25/18 0749   07/23/18 1000  cefoTEtan (CEFOTAN) 1 g in sodium chloride 0.9 % 100 mL IVPB     1 g 200 mL/hr over 30 Minutes Intravenous  Once 07/23/18 0825 07/23/18 1117      Assessment/Plan: POD 3/1 elap, sbr, open abdomen followed by closure/ileocecectomy/ileocolonic anastomosis-Rachel Park -npo, ng tube -I expect prolonged ileus and would like to rest bowel no trickle feeds due to ischemia and her anastomosis- start tpn today -hopefully extubate then oob/ics Afib -hold anticoagulation -proph lovenox only for now Bid dressing changes  Rolm Bookbinder 07/26/2018

## 2018-07-26 NOTE — Progress Notes (Signed)
Nutrition Follow-up  RD working remotely.  DOCUMENTATION CODES:   Not applicable  INTERVENTION:   - TPN per pharmacy  Re-estimated nutritional needs post-extubation:  Kcal: 1500-1700 Protein: 85-105 grams Fluid: >/= 1.5 L  - RD to monitor for diet advancement vs ability to start TF  NUTRITION DIAGNOSIS:   Increased nutrient needs related to post-op healing as evidenced by estimated needs.  Ongoing, being addressed via initiation of TPN  GOAL:   Patient will meet greater than or equal to 90% of their needs  Progressing  MONITOR:   Diet advancement, Weight trends, Labs, Vent status, TF tolerance, Skin, I & O's  REASON FOR ASSESSMENT:   Consult New TPN/TNA  ASSESSMENT:   Patient with PMH significant for CAD s/p cath, HTN, stroke, TIA, and SBO s/p hernia repair in 2018. Presents this admission small bowel obstruction with ischemic bowel.   5/19 - ex-lap resection, LOA, VAC placement for open abdomen 5/21 - s/p ileocecectomy with ileocolonic anastomosis, abdominal closure 5/22 - extubated  Surgery expecting prolonged ileus and would like to rest bowel. No plan for trickle feeds due to ischemia and anastomosis. Plan is to start TPN today at 1800 at 30 ml/hr. This will provide 586 kcal, 49 grams or protein, and 115 grams of dextrose.  Given pt now extubated, will re-estimate nutritional needs. Communicated re-estimated needs TPN pharmacist.  18 French NGT in left nare to low intermittent suction. Drainage clear/green/brown per documentation.  Medications reviewed and include: SSI, Protonix, potassium phosphate 10 mmol once IVF: NS @ 50 ml/hr (plan to decrease to 30 ml/hr at 1800)  Labs reviewed: chloride 113 (H), phosphorus 2.4 (L) CBG's: 96, 118, 103, 104 x 12 hours  UOP: 475 ml x 24 hours I/O's: +7.4 L since admit  Diet Order:   Diet Order            Diet NPO time specified Except for: Sips with Meds  Diet effective now              EDUCATION NEEDS:    Not appropriate for education at this time  Skin:  Skin Assessment: Skin Integrity Issues: Wound Vac: open abdomen Incisions: closed abdomen (VAC removed 5/21)  Last BM:  no documented BM  Height:   Ht Readings from Last 1 Encounters:  07/23/18 5' 2.5" (1.588 m)    Weight:   Wt Readings from Last 1 Encounters:  07/26/18 70.7 kg   EDW: 65 kg  Ideal Body Weight:  52.3 kg  BMI:  Body mass index is 28.05 kg/m.  Estimated Nutritional Needs:   Kcal:  1500-1700 (MSJ x 1.4-1.6)  Protein:  85-105 grams  Fluid:  >/= 1.5 L    Gaynell Face, MS, RD, LDN Inpatient Clinical Dietitian Pager: [phone] Weekend/After Hours: [phone]

## 2018-07-26 NOTE — Procedures (Signed)
Extubation Procedure Note  Patient Details:   Name: Rachel Park DOB: 1931-04-08 MRN: [id number]   Airway Documentation:    Vent end date: 07/26/18 Vent end time: 1102   Evaluation  O2 sats: stable throughout Complications: No apparent complications Patient did tolerate procedure well. Bilateral Breath Sounds: Clear   Yes   PT was extubated to a 3 L [place]  PT has strong cough and is able to clear secretions  Sat are stable   RT to monitor   Tattianna Schnarr, Leonie Douglas 07/26/2018, 11:04 AM

## 2018-07-26 NOTE — Progress Notes (Signed)
Rehab Admissions Coordinator Note:  Patient was screened by Michel Santee, PT, DPT for appropriateness for an Inpatient Acute Rehab Consult.  At this time, we are recommending Inpatient Rehab consult.  I will page MD for order.   Shann Medal, PT, DPT Admissions Coordinator [phone] 07/26/18  3:23 PM

## 2018-07-27 ENCOUNTER — Encounter (HOSPITAL_COMMUNITY): Payer: Self-pay | Admitting: Cardiology

## 2018-07-27 DIAGNOSIS — I4891 Unspecified atrial fibrillation: Secondary | ICD-10-CM

## 2018-07-27 LAB — CBC
HCT: 29.7 % — ABNORMAL LOW (ref 36.0–46.0)
Hemoglobin: 9.6 g/dL — ABNORMAL LOW (ref 12.0–15.0)
MCH: 30.4 pg (ref 26.0–34.0)
MCHC: 32.3 g/dL (ref 30.0–36.0)
MCV: 94 fL (ref 80.0–100.0)
Platelets: 172 10*3/uL (ref 150–400)
RBC: 3.16 MIL/uL — ABNORMAL LOW (ref 3.87–5.11)
RDW: 15.5 % (ref 11.5–15.5)
WBC: 8.8 10*3/uL (ref 4.0–10.5)
nRBC: 0 % (ref 0.0–0.2)

## 2018-07-27 LAB — DIFFERENTIAL
Abs Immature Granulocytes: 0.07 10*3/uL (ref 0.00–0.07)
Basophils Absolute: 0 10*3/uL (ref 0.0–0.1)
Basophils Relative: 0 %
Eosinophils Absolute: 0 10*3/uL (ref 0.0–0.5)
Eosinophils Relative: 0 %
Immature Granulocytes: 1 %
Lymphocytes Relative: 6 %
Lymphs Abs: 0.5 10*3/uL — ABNORMAL LOW (ref 0.7–4.0)
Monocytes Absolute: 0.6 10*3/uL (ref 0.1–1.0)
Monocytes Relative: 7 %
Neutro Abs: 7.7 10*3/uL (ref 1.7–7.7)
Neutrophils Relative %: 86 %
WBC Morphology: INCREASED

## 2018-07-27 LAB — COMPREHENSIVE METABOLIC PANEL
ALT: 24 U/L (ref 0–44)
AST: 21 U/L (ref 15–41)
Albumin: 2 g/dL — ABNORMAL LOW (ref 3.5–5.0)
Alkaline Phosphatase: 44 U/L (ref 38–126)
Anion gap: 3 — ABNORMAL LOW (ref 5–15)
BUN: 18 mg/dL (ref 8–23)
CO2: 22 mmol/L (ref 22–32)
Calcium: 7.9 mg/dL — ABNORMAL LOW (ref 8.9–10.3)
Chloride: 114 mmol/L — ABNORMAL HIGH (ref 98–111)
Creatinine, Ser: 0.43 mg/dL — ABNORMAL LOW (ref 0.44–1.00)
GFR calc Af Amer: >60 mL/min (ref >60)
GFR calc non Af Amer: >60 mL/min (ref >60)
Glucose, Bld: 155 mg/dL — ABNORMAL HIGH (ref 70–99)
Potassium: 3.7 mmol/L (ref 3.5–5.1)
Sodium: 139 mmol/L (ref 135–145)
Total Bilirubin: 1 mg/dL (ref 0.3–1.2)
Total Protein: 4.4 g/dL — ABNORMAL LOW (ref 6.5–8.1)

## 2018-07-27 LAB — PHOSPHORUS: Phosphorus: 1.4 mg/dL — ABNORMAL LOW (ref 2.5–4.6)

## 2018-07-27 LAB — TRIGLYCERIDES: Triglycerides: 76 mg/dL (ref <150)

## 2018-07-27 LAB — PREALBUMIN: Prealbumin: 6.2 mg/dL — ABNORMAL LOW (ref 18–38)

## 2018-07-27 LAB — GLUCOSE, CAPILLARY
Glucose-Capillary: 116 mg/dL — ABNORMAL HIGH (ref 70–99)
Glucose-Capillary: 119 mg/dL — ABNORMAL HIGH (ref 70–99)
Glucose-Capillary: 122 mg/dL — ABNORMAL HIGH (ref 70–99)
Glucose-Capillary: 133 mg/dL — ABNORMAL HIGH (ref 70–99)
Glucose-Capillary: 137 mg/dL — ABNORMAL HIGH (ref 70–99)
Glucose-Capillary: 142 mg/dL — ABNORMAL HIGH (ref 70–99)

## 2018-07-27 LAB — MAGNESIUM: Magnesium: 2.2 mg/dL (ref 1.7–2.4)

## 2018-07-27 MED ORDER — METOPROLOL TARTRATE 5 MG/5ML IV SOLN
2.5000 mg | Freq: Four times a day (QID) | INTRAVENOUS | Status: DC
  Administered 2018-07-27 – 2018-07-29 (×7): 2.5 mg via INTRAVENOUS
  Filled 2018-07-27 (×7): qty 5

## 2018-07-27 MED ORDER — TRAVASOL 10 % IV SOLN
INTRAVENOUS | Status: AC
  Administered 2018-07-27: 18:00:00 via INTRAVENOUS
  Filled 2018-07-27: qty 904.51

## 2018-07-27 MED ORDER — POTASSIUM PHOSPHATES 15 MMOLE/5ML IV SOLN
20.0000 mmol | Freq: Once | INTRAVENOUS | Status: AC
  Administered 2018-07-27: 20 mmol via INTRAVENOUS
  Filled 2018-07-27: qty 6.67

## 2018-07-27 MED ORDER — POTASSIUM CHLORIDE 10 MEQ/100ML IV SOLN
10.0000 meq | INTRAVENOUS | Status: AC
  Administered 2018-07-27 (×2): 10 meq via INTRAVENOUS
  Filled 2018-07-27 (×2): qty 100

## 2018-07-27 MED ORDER — METOPROLOL TARTRATE 25 MG PO TABS: 25.0000 mg | ORAL_TABLET | Freq: Two times a day (BID) | ORAL | Status: DC

## 2018-07-27 NOTE — Progress Notes (Signed)
2 Days Post-Op   Subjective/Chief Complaint: Up in chair, walked this AM, no flatus   Objective: Vital signs in last 24 hours: Temp:  [98.1 F (36.7 C)-99.3 F (37.4 C)] 98.4 F (36.9 C) (05/23 0400) Pulse Rate:  [90-140] 100 (05/23 0700) Resp:  [12-29] 12 (05/23 0700) BP: (92-153)/(45-99) 127/81 (05/23 0700) SpO2:  [91 %-99 %] 95 % (05/23 0700) Arterial Line BP: (114-156)/(59-87) 156/70 (05/22 1200) FiO2 (%):  [40 %] 40 % (05/22 0800) Weight:  [69.3 kg-69.5 kg] 69.5 kg (05/23 0500) Last BM Date: (PTA)  Intake/Output from previous day: 05/22 0701 - 05/23 0700 In: 1793.4 [I.V.:1369.2; NG/GT:100; IV Piggyback:324.1] Out: 8144 [Urine:1185; Emesis/NG output:50] Intake/Output this shift: No intake/output data recorded.  General appearance: alert and cooperative Resp: clear to auscultation bilaterally Cardio: irregularly irregular rhythm GI: soft, wound OK, quiet  Lab Results:  Recent Labs    07/26/18 0332 07/27/18 0336  WBC 8.6 8.8  HGB 11.5* 9.6*  HCT 34.1* 29.7*  PLT 179 172   BMET Recent Labs    07/26/18 0332 07/27/18 0336  NA 139 139  K 3.6 3.7  CL 113* 114*  CO2 17* 22  GLUCOSE 108* 155*  BUN 20 18  CREATININE 0.65 0.43*  CALCIUM 7.8* 7.9*   PT/INR No results for input(s): LABPROT, INR in the last 72 hours. ABG No results for input(s): PHART, HCO3 in the last 72 hours.  Invalid input(s): PCO2, PO2  Studies/Results: Dg Chest Port 1 View  Result Date: 07/26/2018 CLINICAL DATA:  Respiratory failure. EXAM: PORTABLE CHEST 1 VIEW COMPARISON:  Radiograph of Jul 24, 2018. FINDINGS: Stable cardiomegaly. Atherosclerosis of thoracic aorta is noted. Endotracheal and nasogastric tubes are unchanged in position. Right-sided PICC line is unchanged in position. No pneumothorax is noted. Mild bibasilar opacities are noted concerning for atelectasis with possible small pleural effusions. Bony thorax is unremarkable. IMPRESSION: Mild bibasilar subsegmental atelectasis  is noted with possible small pleural effusions. Stable support apparatus. Aortic Atherosclerosis (ICD10-I70.0). Electronically Signed   By: Marijo Conception M.D.   On: 07/26/2018 08:28   Dg Or Local Abdomen  Result Date: 07/25/2018 CLINICAL DATA:  Incorrect needle count.  Bowel resection. EXAM: OR LOCAL ABDOMEN COMPARISON:  07/23/2018 FINDINGS: No retained instrument or needle identified. Image quality degraded by mild motion NG in the stomach. Staple line in the region of the cecum. Normal bowel gas pattern. IMPRESSION: No retained needle is present in the abdomen. These results were called by telephone at the time of interpretation on 07/25/2018 at 1:11 pm to [place] , who verbally acknowledged these results. Electronically Signed   By: Franchot Gallo M.D.   On: 07/25/2018 13:12    Anti-infectives: Anti-infectives (From admission, onward)   Start     Dose/Rate Route Frequency Ordered Stop   07/25/18 1000  ceFAZolin (ANCEF) IVPB 2g/100 mL premix     2 g 200 mL/hr over 30 Minutes Intravenous To Short Stay 07/25/18 0749 07/25/18 1200   07/25/18 0745  ceFAZolin (ANCEF) IVPB 2g/100 mL premix  Status:  Discontinued     2 g 200 mL/hr over 30 Minutes Intravenous  Once 07/25/18 0735 07/25/18 0749   07/23/18 1000  cefoTEtan (CEFOTAN) 1 g in sodium chloride 0.9 % 100 mL IVPB     1 g 200 mL/hr over 30 Minutes Intravenous  Once 07/23/18 0825 07/23/18 1117      Assessment/Plan: POD 4/2 elap, sbr, open abdomen followed by closure/ileocecectomy/ileocolonic anastomosis-[place] -npo except ice, ng tube -Await bowel function  Afib -hold anticoagulation -proph lovenox only for now Bid dressing changes  LOS: 5 days    Zenovia Jarred 07/27/2018

## 2018-07-27 NOTE — Consult Note (Signed)
Cardiology Consultation:   Patient ID: Rachel Park; [id number]; 12-04-1931   Admit date: 07/22/2018 Date of Consult: 07/27/2018  Primary Care Provider: Lajean Manes, MD Primary Cardiologist: Mertie Moores, MD  Primary Electrophysiologist:  Dr. Curt Bears   Patient Profile:   Rachel Park is a 85 y.o. female with a hx of atrial fib who is being seen today for the evaluation of this at the request of Dr. Vaughan Browner.  History of Present Illness:   [name] has a history of atrial fib and CAD.  She failed to maintain NSR on Tikosyn or sotalol and has been treated with amiodarone and also is on Dilt and metoprolol at home as well as Eliquis.  She has had PAF and has had recent dose adjustments for break through episodes with rapid rate.  She has been seen in the [facility] and has seen Dr. Curt Bears.    The patient had exploratory lab 5/19.  She had necrotic bowel.  She returned to the OR for ileocolectomy and ileocolonic anastomosis.  She was in the ICU for vent management and is now extubated.      She has had rapid rates.  However, currently it is well controlled.  She is receiving IV metoprolol scheduled.  She does not feel the palpitations at home.  She notices breakthroughs when the BP monitor reports an elevated HR.  The patient denies any new symptoms such as chest discomfort, neck or arm discomfort. There has been no new shortness of breath, PND or orthopnea. There have been no reported palpitations, presyncope or syncope.   Past Medical History:  Diagnosis Date   Arthritis    Complication of anesthesia    Coronary artery disease    Hypertension    Hypothyroidism    PAF (paroxysmal atrial fibrillation) (Rachel Park)    on Xarelto for maybe a year, opted to stop   PONV (postoperative nausea and vomiting)    Renal infarction (Rachel Park)    Small bowel obstruction (Rachel Park) 06/2015   Stroke (Rachel Park)    oct 2016   TIA (transient ischemic attack) 03/24/2015   Vertigo, benign positional    to  be evaluated, intermittent    Past Surgical History:  Procedure Laterality Date   ABDOMINAL HYSTERECTOMY     APPLICATION OF WOUND VAC N/A 07/23/2018   Procedure: APPLICATION OF WOUND VAC;  Surgeon: Rolm Bookbinder, MD;  Location: [facility];  Service: General;  Laterality: N/A;   BOWEL RESECTION N/A 07/23/2018   Procedure: SMALL BOWEL RESECTION;  Surgeon: Rolm Bookbinder, MD;  Location: [facility];  Service: General;  Laterality: N/A;   CARDIAC CATHETERIZATION N/A 12/24/2014   Procedure: Left Heart Cath and Coronary Angiography;  Surgeon: Sherren Mocha, MD;  Location: [facility];  Service: Cardiovascular;  Laterality: N/A;   CARDIOVERSION N/A 05/22/2018   Procedure: CARDIOVERSION;  Surgeon: Josue Hector, MD;  Location: [facility] ENDOSCOPY;  Service: Cardiovascular;  Laterality: N/A;   CATARACT EXTRACTION Bilateral    COLONOSCOPY WITH PROPOFOL N/A 12/01/2013   Procedure: COLONOSCOPY WITH PROPOFOL;  Surgeon: Garlan Fair, MD;  Location: [facility] ENDOSCOPY;  Service: Endoscopy;  Laterality: N/A;   COMPLEX WOUND CLOSURE  07/25/2018   Procedure: Complex Wound Closure;  Surgeon: Rolm Bookbinder, MD;  Location: [facility];  Service: General;;   Pennie Rushing  07/25/2018   Procedure: Ileocecetomy;  Surgeon: Rolm Bookbinder, MD;  Location: [facility];  Service: General;;   Rachel Park N/A 03/10/2016   Procedure: INCISIONAL HERNIA REPAIR TIMES TWO;  Surgeon:  Rachel Gemma, MD;  Location: [facility];  Service: General;  Laterality: N/A;   INGUINAL HERNIA REPAIR Bilateral 03/10/2016   Procedure: BILATERAL INGUINAL HERNIA REPAIRS;  Surgeon: Rachel Gemma, MD;  Location: [facility];  Service: General;  Laterality: Bilateral;   INSERTION OF MESH N/A 03/10/2016   Procedure: INSERTION OF MESH TO BILATERAL GROINS AND ABDOMEN;  Surgeon: Rachel Gemma, MD;  Location: [facility];  Service: General;  Laterality: N/A;   laparotomy  06/2015   LAPAROTOMY N/A 07/23/2018   Procedure: EXPLORATORY LAPAROTOMY;  Surgeon:  Rolm Bookbinder, MD;  Location: [facility];  Service: General;  Laterality: N/A;   LEFT HEART CATH AND CORONARY ANGIOGRAPHY N/A 09/27/2016   Procedure: Left Heart Cath and Coronary Angiography;  Surgeon: Sherren Mocha, MD;  Location: [facility];  Service: Cardiovascular;  Laterality: N/A;   TONSILLECTOMY       Home Medications:  Prior to Admission medications   Medication Sig Start Date End Date Taking? Authorizing Provider  acetaminophen (TYLENOL) 500 MG tablet Take 500 mg by mouth every 6 (six) hours as needed for mild pain.    Yes [provider]  amiodarone (PACERONE) 200 MG tablet Take 1 tablet (200 mg total) twice daily for one month.  Then reduce and take 1 tablet daily 06/24/18  Yes Nahser, Wonda Cheng, MD  apixaban (ELIQUIS) 5 MG TABS tablet Take 1 tablet (5 mg total) by mouth 2 (two) times daily. 06/24/18  Yes Nahser, Wonda Cheng, MD  CALCIUM PO Take 1 tablet by mouth daily.   Yes [provider]  cholecalciferol (VITAMIN D) 1000 UNITS tablet Take 2,000 Units by mouth daily with lunch.    Yes [provider]  diltiazem (CARDIZEM CD) 180 MG 24 hr capsule Take 1 capsule (180 mg total) by mouth daily. 06/24/18  Yes Nahser, Wonda Cheng, MD  levothyroxine (SYNTHROID, LEVOTHROID) 75 MCG tablet Take 75 mcg by mouth daily before breakfast.   Yes [provider]  metoprolol tartrate (LOPRESSOR) 25 MG tablet Take 1 tablet (25 mg total) by mouth 2 (two) times daily. 06/24/18  Yes Nahser, Wonda Cheng, MD  NITROSTAT 0.4 MG SL tablet Place 1 tablet (0.4 mg total) under the tongue every 5 (five) minutes as needed. Chest pain 06/24/18  Yes Nahser, Wonda Cheng, MD  Polyethyl Glycol-Propyl Glycol (SYSTANE ULTRA) 0.4-0.3 % SOLN Place 1-2 drops into both eyes 3 (three) times daily as needed (dry eyes).   Yes [provider]  raNITIdine HCl (ZANTAC PO) Take 1 tablet by mouth daily as needed (heartbrun).   Yes [provider]  rosuvastatin (CRESTOR) 10 MG tablet  Take 1 tablet (10 mg total) by mouth daily. 06/24/18  Yes Nahser, Wonda Cheng, MD    Inpatient Medications: Scheduled Meds:  chlorhexidine  15 mL Mouth Rinse BID   Chlorhexidine Gluconate Cloth  6 each Topical Daily   enoxaparin (LOVENOX) injection  40 mg Subcutaneous Q24H   insulin aspart  0-9 Units Subcutaneous Q4H   levothyroxine  40 mcg Intravenous Daily   mouth rinse  15 mL Mouth Rinse q12n4p   metoprolol tartrate  2.5 mg Intravenous Q6H   mupirocin ointment  1 application Nasal BID   pantoprazole (PROTONIX) IV  40 mg Intravenous Daily   sodium chloride flush  10-40 mL Intracatheter Q12H   Continuous Infusions:  sodium chloride Stopped (07/27/18 1025)   potassium PHOSPHATE IVPB (in mmol) 20 mmol (07/27/18 1135)   TPN ADULT (ION) 30 mL/hr at 07/27/18 1100   TPN ADULT (  ION)     PRN Meds: HYDROmorphone (DILAUDID) injection, metoprolol tartrate, ondansetron **OR** ondansetron (ZOFRAN) IV, sodium chloride flush  Allergies:    Allergies  Allergen Reactions   Anesthesia S-I-60 Nausea And Vomiting    "with hysterectomy years back had post op N/V"    Social History:   Social History   Socioeconomic History   Marital status: Divorced    Spouse name: Not on file   Number of children: 2   Years of education: HS   Highest education level: Not on file  Occupational History   Occupation: retired  Scientist, product/process development strain: Not on file   Food insecurity:    Worry: Not on file    Inability: Not on Lexicographer needs:    Medical: Not on file    Non-medical: Not on file  Tobacco Use   Smoking status: Never Smoker   Smokeless tobacco: Never Used  Substance and Sexual Activity   Alcohol use: Not Currently    Comment: occ.   Drug use: No   Sexual activity: Not on file  Lifestyle   Physical activity:    Days per week: Not on file    Minutes per session: Not on file   Stress: Not on file  Relationships   Social  connections:    Talks on phone: Not on file    Gets together: Not on file    Attends religious service: Not on file    Active member of club or organization: Not on file    Attends meetings of clubs or organizations: Not on file    Relationship status: Not on file   Intimate partner violence:    Fear of current or ex partner: Not on file    Emotionally abused: Not on file    Physically abused: Not on file    Forced sexual activity: Not on file  Other Topics Concern   Not on file  Social History Narrative   Lives alone.  Widow.  Two children    Family History:    Family History  Problem Relation Age of Onset   Heart attack Father 58       Died age 27   Stroke Mother    Cancer Sister        breast     ROS:  Please see the history of present illness.  As stated in the HPI and negative for all other systems.   Physical Exam/Data:   Vitals:   07/27/18 0900 07/27/18 1000 07/27/18 1100 07/27/18 1133  BP: 124/79 97/64 128/87   Pulse:  (!) 115 (!) 118   Resp: 15 13 (!) 0   Temp:    98.5 F (36.9 C)  TempSrc:    Oral  SpO2:  94% 95%   Weight:      Height:        Intake/Output Summary (Last 24 hours) at 07/27/2018 1349 Last data filed at 07/27/2018 1100 Gross per 24 hour  Intake 1525.86 ml  Output 485 ml  Net 1040.86 ml   Filed Weights   07/26/18 0339 07/26/18 1730 07/27/18 0500  Weight: 70.7 kg 69.3 kg 69.5 kg   Body mass index is 27.58 kg/m.  GENERAL:  No acute distress.  HEENT:   Pupils equal round and reactive, fundi not visualized, oral mucosa unremarkable NECK:  No  jugular venous distention, waveform within normal limits, carotid upstroke brisk and symmetric, no bruits, no thyromegaly LYMPHATICS:  No cervical, inguinal  adenopathy LUNGS:   Clear to auscultation bilaterally BACK:  No CVA tenderness CHEST:   Unremarkable HEART:  PMI not displaced or sustained,S1 and S2 within normal limits, no S3, no clicks, no rubs, no murmurs, irregular ABD:    Decreased bowel sounds.  Surgical dressing in place.  EXT:  2 plus pulses throughout, no  edema, no cyanosis no clubbing SKIN:  No rashes no nodules NEURO:   Cranial nerves II through XII grossly intact, motor grossly intact throughout PSYCH:    Cognitively intact, oriented to person place and time   EKG:  The EKG was personally reviewed and demonstrates:  Atrial fib, rate 87, axis WNL, intervals WNL, no acute ST T wave changes.  Telemetry:  Telemetry was personally reviewed and demonstrates:  Rate controlled  Relevant CV Studies: NA  Laboratory Data:  Chemistry Recent Labs  Lab 07/25/18 0318 07/26/18 0332 07/27/18 0336  NA 135 139 139  K 4.4 3.6 3.7  CL 109 113* 114*  CO2 19* 17* 22  GLUCOSE 128* 108* 155*  BUN 20 20 18   CREATININE 0.62 0.65 0.43*  CALCIUM 8.0* 7.8* 7.9*  GFRNONAA >60 >60 >60  GFRAA >60 >60 >60  ANIONGAP 7 9 3*    Recent Labs  Lab 07/22/18 1903 07/24/18 0353 07/27/18 0336  PROT 7.4  --  4.4*  ALBUMIN 4.3 3.0* 2.0*  AST 38  --  21  ALT 42  --  24  ALKPHOS 104  --  44  BILITOT 0.8  --  1.0   Hematology Recent Labs  Lab 07/25/18 0010 07/26/18 0332 07/27/18 0336  WBC 11.0* 8.6 8.8  RBC 4.36 3.72* 3.16*  HGB 13.3 11.5* 9.6*  HCT 39.6 34.1* 29.7*  MCV 90.8 91.7 94.0  MCH 30.5 30.9 30.4  MCHC 33.6 33.7 32.3  RDW 15.1 15.4 15.5  PLT 224 179 172   Cardiac EnzymesNo results for input(s): TROPONINI in the last 168 hours. No results for input(s): TROPIPOC in the last 168 hours.  BNPNo results for input(s): BNP, PROBNP in the last 168 hours.  DDimer No results for input(s): DDIMER in the last 168 hours.  Radiology/Studies:  Dg Chest Port 1 View  Result Date: 07/26/2018 CLINICAL DATA:  Respiratory failure. EXAM: PORTABLE CHEST 1 VIEW COMPARISON:  Radiograph of Jul 24, 2018. FINDINGS: Stable cardiomegaly. Atherosclerosis of thoracic aorta is noted. Endotracheal and nasogastric tubes are unchanged in position. Right-sided PICC line is unchanged  in position. No pneumothorax is noted. Mild bibasilar opacities are noted concerning for atelectasis with possible small pleural effusions. Bony thorax is unremarkable. IMPRESSION: Mild bibasilar subsegmental atelectasis is noted with possible small pleural effusions. Stable support apparatus. Aortic Atherosclerosis (ICD10-I70.0). Electronically Signed   By: Marijo Conception M.D.   On: 07/26/2018 08:28   Portable Chest Xray  Result Date: 07/24/2018 CLINICAL DATA:  Check endotracheal tube placement EXAM: PORTABLE CHEST 1 VIEW COMPARISON:  07/23/2018 FINDINGS: Endotracheal tube and gastric catheter are noted extending into the stomach. Right-sided PICC line is noted at the cavoatrial junction in satisfactory position. Cardiac shadow is within normal limits. The lungs are well aerated with minimal left basilar atelectasis. The right jugular central line has been removed. IMPRESSION: Tubes and lines as described above stable with the exception of interval removal of the right jugular line. Mild left basilar atelectasis. Electronically Signed   By: Inez Catalina M.D.   On: 07/24/2018 07:59   Dg Chest Port 1 View  Result Date: 07/23/2018 CLINICAL DATA:  Status post  PICC line placement EXAM: PORTABLE CHEST 1 VIEW COMPARISON:  07/23/2018 FINDINGS: The right-sided PICC line is well positioned with the tip terminating near the cavoatrial junction. Endotracheal tube terminates above the carina. The right IJ line terminates in the right axilla. There is no pneumothorax. A left basilar airspace opacity is noted. The enteric tube terminates below the left hemidiaphragm. The stomach is distended with contrast. IMPRESSION: 1. Well-positioned right-sided PICC line. 2. No significant interval change in malpositioned right-sided IJ line. 3. Endotracheal tube as above. 4. Persistent left basilar airspace opacity Electronically Signed   By: Constance Holster M.D.   On: 07/23/2018 19:02   [place] Ekg Site Rite  Result Date:  07/23/2018 If Site Rite image not attached, placement could not be confirmed due to current cardiac rhythm.  Dg Or Local Abdomen  Result Date: 07/25/2018 CLINICAL DATA:  Incorrect needle count.  Bowel resection. EXAM: OR LOCAL ABDOMEN COMPARISON:  07/23/2018 FINDINGS: No retained instrument or needle identified. Image quality degraded by mild motion NG in the stomach. Staple line in the region of the cecum. Normal bowel gas pattern. IMPRESSION: No retained needle is present in the abdomen. These results were called by telephone at the time of interpretation on 07/25/2018 at 1:11 pm to [place] , who verbally acknowledged these results. Electronically Signed   By: Franchot Gallo M.D.   On: 07/25/2018 13:12    Assessment and Plan:   ATRIAL FIB:  Ms. HUMAIRA SCULLEY has a CHA2DS2 - VASc score of 6.  The key will be pain management.  For now IV beta blocker scheduled and PRN seems to work.  Can start IV dilt if necessary.  Anticoagulation after surgery says it is OK.  Would not need bridge but would start Eliquis back when able.   CAD:  No active ischemia.  No further work up.    DYSLIPIDEMIA:  PO meds on hold.     For questions or updates, please contact [place] Please consult [email or web address] for contact info under Cardiology/STEMI.   Signed, Minus Breeding, MD  07/27/2018 1:49 PM

## 2018-07-27 NOTE — Progress Notes (Addendum)
NG tube came out - pt reports tape came loose and "then I felt something in my nose and it came out!". Paging surgery on call.  MD Alvino Blood responded, said okay to trial out, pt should stay NPO with only occassional ice chips. If she becomes nauseated, we can replace NG. I relayed this to pt.

## 2018-07-27 NOTE — Progress Notes (Signed)
[facility] CONSULT NOTE   Pharmacy Consult for TPN Indication: Prolonged ileus  Patient Measurements: Height: 5' 2.5" (158.8 cm) Weight: 153 lb 3.5 oz (69.5 kg) IBW/kg (Calculated) : 51.25 TPN AdjBW (KG): 54.7 Body mass index is 27.58 kg/m. Usual Weight: 63-69kg  Assessment: Rachel Park is an 85yo female admitted with hx of SBO with N/V and epigastric pain on 5/18. Later taken to OR for exploratory laparotomy and found to have necrotic bowel with resection with open abdomen followed by closure/ileocecectomy/ileocolonic anastomosis. Surgery consulted pharmacy for TPN as would like patient to have bowel rest and expects prolonged ileus.  GI: s/p bowel resection with expectation of prolonged ileus as above Endo: BG 109-155 now with SSI ordered Insulin requirements in the past 24 hours: 3 units Lytes: Na 139, K 3.7 (will replace), coCa low at 9.5, phos low at 1.4 (will replace), Mg 2.2, TG 76 Renal:SCr 0.65>0.43 Pulm: extubated on RA Cards: BP 120s/60s with HR tachy 110-120s Hepatobil:t bili 1, AST/ALT WNL Neuro: nml ID: periop abx  TPN Access: PICC line placed 5/19 TPN start date: 5/22 Nutritional Goals (per RD recommendation on 07/26/2018):  Kcal: 1500-1700 Protein: 85-105 grams Fluid: >/= 1.5 L  Goal TPN rate is 56 ml/hr (provides 90g protein and 306 g dextrose)  Current Nutrition:   Plan:  Increase TPN to 56 mL/hr. Hold 20% lipid emulsion for first 7 days for ICU patients per ASPEN guidelines (Start date 08/02/2018) This TPN provides provides 90g protein and 306 g dextrose which provides 1400 kCals per day, meeting ~95% of patient needs Electrolytes in TPN: standard -Add 20 mmol Kphos x 1 - Add KCl 20 meq x 1 - D/c NS infusion at 1800 today Add MVI, trace elements MWF to TPN SSI and adjust as needed Monitor TPN labs tomorrow morning  Thank you for involving pharmacy in this patient's care.  Alanda Slim, PharmD, [facility] Clinical  Pharmacist Please see AMION for all Pharmacists' Contact Phone Numbers 07/27/2018, 7:50 AM

## 2018-07-27 NOTE — Progress Notes (Signed)
Family medicine team continues to follow socially.  Appreciate excellent care provided by CCM and surgery.  Plan to resume care for patient when transferred to floor.  [place] Constable, D.O.  PGY-1 Family Medicine  07/27/2018 7:44 AM

## 2018-07-27 NOTE — Progress Notes (Signed)
NAME:  Rachel Park, MRN:  [id number], DOB:  1931-07-16, LOS: 5 ADMISSION DATE:  07/22/2018, CONSULTATION DATE:  07/23/2018 REFERRING MD:  Dr. Donne Hazel, CHIEF COMPLAINT:  SBO/ Ischemic bowel  Brief History   73 yof with SBO, failed medical management, taken to OR with findings of necrotic bowel s/p resection.  Abd left open with wound vac and returns to ICU on mechanical ventilation. PCCM consulted  Past Medical History  Small bowel obstruction, hernia with prior repair, Afib- on Eliquis, CVA, hypothyroidism, HTN, CAD  Significant Hospital Events   5/18 Admit 5/19 OR 5/19 OR- ex lap/ resection/ wound vac 5/21 OR -Abd wall closure , Ileocecetomy with ilecolonic anastomosis  5/22 Extubated  Consults:   Procedures:  5/19 ETT >> 5/22 5/19 left radial aline >> 5/22 5/19 rt PICC >>  Significant Diagnostic Tests:  5/18 CT abd >>  1. Dilated loops of small bowel throughout the abdomen with extensive hypoenhancement, fat stranding, and swirling of the mesentery is highly concerning for an internal hernia with ischemic small bowel. Correlation with lactate and surgical consultation is recommended. There is no free air. There is a small amount of free fluid in the upper abdomen. 2. The SMA is widely patent. 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 3. 1.6 cm hyperattenuating wedge-shaped area in hepatic segment 4A, new from prior study. This is favored to represent a flash hemangioma or THAD. Follow-up with nonemergent outpatient ultrasound is recommended. 4. Cardiomegaly with interlobular septal thickening suggestive of developing volume overload. There is a trace right-sided pleural effusion. 5. Cortical thinning of the patient's right kidney consistent with evolving infarcts as demonstrated on prior CT from 04/21/2018. 6. Sigmoid diverticulosis without CT evidence of diverticulitis.  Micro Data:  5/18 COVID >> neg 5/19 MRSA PCR >> neg   Antimicrobials:  5/19 cefotetan x 1 dose  Ancef 5/21 x 1 preop   Interim history/subjective:  Extubated yesterday. On room air Sitting up in chair.  Was able to walk around the unit today No dyspnea or chest pain.  Complains of low back pain  Objective   Blood pressure 127/81, pulse 100, temperature 98.1 F (36.7 C), temperature source Oral, resp. rate 12, height 5' 2.5" (1.588 m), weight 69.5 kg, SpO2 95 %.    Vent Mode: CPAP;PSV FiO2 (%):  [40 %] 40 % PEEP:  [5 cmH20] 5 cmH20 Pressure Support:  [5 cmH20-10 cmH20] 5 cmH20   Intake/Output Summary (Last 24 hours) at 07/27/2018 0750 Last data filed at 07/27/2018 0700 Gross per 24 hour  Intake 1793.36 ml  Output 1235 ml  Net 558.36 ml   Filed Weights   07/26/18 0339 07/26/18 1730 07/27/18 0500  Weight: 70.7 kg 69.3 kg 69.5 kg   Examination: Gen:      No acute distress HEENT:  EOMI, sclera anicteric Neck:     No masses; no thyromegaly Lungs:    Clear to auscultation bilaterally; normal respiratory effort CV:         Regular rate and rhythm; no murmurs Abd:      Abdominal dressings, clean and dry. Ext:    No edema; adequate peripheral perfusion Skin:      Warm and dry; no rash Neuro: alert and oriented x 3  Resolved Hospital Problem list     Assessment & Plan:  SBO with ischemic bowel s/p ex lap with resection, open abdomen with wound vac Return to the OR 07/25/2018 with abdominal wall closure, ileocecectomy/ileocolonic anastomosis. P:  Post op management per CCS NGT NPO TPN for nutrition  Acute respiratory insufficiency in the post-operative setting P:  Stable post extubation  Atrial Fib Hx HTN - on amiodarone, metoprolol, diltiazem, and Eliquis at home  P:  Restart standing lopressor Resume anticoagulation when cleared by surgery  Hypothyroidism P:  IV synthroid   Best practice:  Diet: NPO, TPN Pain/Anxiety/Delirium protocol (if indicated): prn fentanyl VAP protocol (if indicated): yes DVT  prophylaxis: SCDs GI prophylaxis: PPI Glucose control: CBG q 4, add SSI if > 180 Mobility: BR Code Status: Full  Family Communication: Patient and Dr. Donnetta Hutching updated.  Disposition:Out of ICU  Labs   CBC: Recent Labs  Lab 07/23/18 1432 07/24/18 0353 07/25/18 0010 07/26/18 0332 07/27/18 0336  WBC 15.4* 12.6* 11.0* 8.6 8.8  NEUTROABS  --   --   --   --  7.7  HGB 14.6 14.4 13.3 11.5* 9.6*  HCT 43.3 43.4 39.6 34.1* 29.7*  MCV 92.1 91.0 90.8 91.7 94.0  PLT 231 244 224 179 267    Basic Metabolic Panel: Recent Labs  Lab 07/23/18 1432 07/24/18 0353 07/25/18 0318 07/26/18 0332 07/27/18 0336  NA 135 134* 135 139 139  K 4.2 4.6 4.4 3.6 3.7  CL 108 108 109 113* 114*  CO2 19* 18* 19* 17* 22  GLUCOSE 160* 146* 128* 108* 155*  BUN 20 21 20 20 18   CREATININE 0.86 0.83 0.62 0.65 0.43*  CALCIUM 7.9* 8.2* 8.0* 7.8* 7.9*  MG  --  1.8  --  1.9 2.2  PHOS  --  3.9  --  2.4* 1.4*   GFR: Estimated Creatinine Clearance: 48.4 mL/min (A) (by C-G formula based on SCr of 0.43 mg/dL (L)). Recent Labs  Lab 07/22/18 1929 07/22/18 2210 07/22/18 2240  07/24/18 0353 07/25/18 0010 07/26/18 0332 07/27/18 0336  WBC  --   --   --    < > 12.6* 11.0* 8.6 8.8  LATICACIDVEN 2.5* 1.2 1.2  --   --   --   --   --    < > = values in this interval not displayed.    Liver Function Tests: Recent Labs  Lab 07/22/18 1903 07/24/18 0353 07/27/18 0336  AST 38  --  21  ALT 42  --  24  ALKPHOS 104  --  44  BILITOT 0.8  --  1.0  PROT 7.4  --  4.4*  ALBUMIN 4.3 3.0* 2.0*   Recent Labs  Lab 07/22/18 1903  LIPASE 27   No results for input(s): AMMONIA in the last 168 hours.  ABG    Component Value Date/Time   PHART 7.334 (L) 07/23/2018 1257   PCO2ART 31.9 (L) 07/23/2018 1257   PO2ART 132.0 (H) 07/23/2018 1257   HCO3 17.0 (L) 07/23/2018 1257   TCO2 18 (L) 07/23/2018 1257   ACIDBASEDEF 8.0 (H) 07/23/2018 1257   O2SAT 99.0 07/23/2018 1257     Coagulation Profile: Recent Labs  Lab 07/23/18  0236  INR 1.1    Cardiac Enzymes: No results for input(s): CKTOTAL, CKMB, CKMBINDEX, TROPONINI in the last 168 hours.  HbA1C: Hgb A1c MFr Bld  Date/Time Value Ref Range Status  07/23/2018 02:36 AM 5.8 (H) 4.8 - 5.6 % Final    Comment:    (NOTE) Pre diabetes:          5.7%-6.4% Diabetes:              >6.4% Glycemic control for   <7.0% adults with diabetes  12/25/2014 03:08 AM 5.4 4.8 - 5.6 % Final    Comment:    (NOTE)         Pre-diabetes: 5.7 - 6.4         Diabetes: >6.4         Glycemic control for adults with diabetes: <7.0     CBG: Recent Labs  Lab 07/26/18 1248 07/26/18 1554 07/26/18 2021 07/26/18 2319 07/27/18 0319  GLUCAP 109* 120* 142* 126* 137*   Kayton Ripp MD [facility] 07/27/2018, 7:51 AM

## 2018-07-27 NOTE — Progress Notes (Signed)
Spoke with ICU attending who noted that patient transferring to floor today.  FPTS will resume care for patient on 5/24 at 0700.  FPTS appreciates excellent care provided by CCM and Surgery.  Briefly, Rachel Park had exploratory laparotomy on 5/19 where she was noted to have necrotic bowel and had resection.  At that time a wound VAC was placed and she was transferred to ICU while intubated and requiring pressor support.  She was able to wean off pressors, but remained intubated the following day.  On 5/21, Rachel Park returned to the OR for exploration during which she had an ileocecectomy with ileocolonic anastomosis.  She was able to be extubated on 5/22.  Dispo plan is that patient will discharge to CIR when medically stable.  Patient has been satting well on room air and has been able to walk with PT.  [place] Constable, D.O.  PGY-1 Family Medicine  07/27/2018 11:12 AM

## 2018-07-27 NOTE — Progress Notes (Signed)
Pt transferred to bed 1, placed on progressive monitor, running Afib rate 90s to 100s. NG tube in place, to low intermittent suction. On room air. TPN infusing throuigh PICC

## 2018-07-28 ENCOUNTER — Inpatient Hospital Stay (HOSPITAL_COMMUNITY): Payer: Medicare HMO

## 2018-07-28 DIAGNOSIS — I480 Paroxysmal atrial fibrillation: Secondary | ICD-10-CM

## 2018-07-28 DIAGNOSIS — E039 Hypothyroidism, unspecified: Secondary | ICD-10-CM

## 2018-07-28 DIAGNOSIS — Z789 Other specified health status: Secondary | ICD-10-CM

## 2018-07-28 DIAGNOSIS — Z7901 Long term (current) use of anticoagulants: Secondary | ICD-10-CM

## 2018-07-28 DIAGNOSIS — K567 Ileus, unspecified: Secondary | ICD-10-CM

## 2018-07-28 DIAGNOSIS — Z9049 Acquired absence of other specified parts of digestive tract: Secondary | ICD-10-CM

## 2018-07-28 LAB — GLUCOSE, CAPILLARY
Glucose-Capillary: 123 mg/dL — ABNORMAL HIGH (ref 70–99)
Glucose-Capillary: 122 mg/dL — ABNORMAL HIGH (ref 70–99)
Glucose-Capillary: 130 mg/dL — ABNORMAL HIGH (ref 70–99)
Glucose-Capillary: 129 mg/dL — ABNORMAL HIGH (ref 70–99)
Glucose-Capillary: 121 mg/dL — ABNORMAL HIGH (ref 70–99)

## 2018-07-28 LAB — BASIC METABOLIC PANEL WITH GFR
Anion gap: 5 (ref 5–15)
BUN: 19 mg/dL (ref 8–23)
CO2: 23 mmol/L (ref 22–32)
Calcium: 8 mg/dL — ABNORMAL LOW (ref 8.9–10.3)
Chloride: 111 mmol/L (ref 98–111)
Creatinine, Ser: 0.39 mg/dL — ABNORMAL LOW (ref 0.44–1.00)
GFR calc Af Amer: >60 mL/min
GFR calc non Af Amer: >60 mL/min
Glucose, Bld: 138 mg/dL — ABNORMAL HIGH (ref 70–99)
Potassium: 4.1 mmol/L (ref 3.5–5.1)
Sodium: 139 mmol/L (ref 135–145)

## 2018-07-28 LAB — CBC
HCT: 28.6 % — ABNORMAL LOW (ref 36.0–46.0)
Hemoglobin: 9.2 g/dL — ABNORMAL LOW (ref 12.0–15.0)
MCH: 30.4 pg (ref 26.0–34.0)
MCHC: 32.2 g/dL (ref 30.0–36.0)
MCV: 94.4 fL (ref 80.0–100.0)
Platelets: 199 10*3/uL (ref 150–400)
RBC: 3.03 MIL/uL — ABNORMAL LOW (ref 3.87–5.11)
RDW: 15.7 % — ABNORMAL HIGH (ref 11.5–15.5)
WBC: 9.1 10*3/uL (ref 4.0–10.5)
nRBC: 0 % (ref 0.0–0.2)

## 2018-07-28 LAB — PHOSPHORUS: Phosphorus: 1.5 mg/dL — ABNORMAL LOW (ref 2.5–4.6)

## 2018-07-28 LAB — MAGNESIUM: Magnesium: 2 mg/dL (ref 1.7–2.4)

## 2018-07-28 MED ORDER — SODIUM PHOSPHATES 45 MMOLE/15ML IV SOLN
30.0000 mmol | Freq: Once | INTRAVENOUS | Status: AC
  Administered 2018-07-28: 30 mmol via INTRAVENOUS
  Filled 2018-07-28: qty 10

## 2018-07-28 MED ORDER — POTASSIUM PHOSPHATES 15 MMOLE/5ML IV SOLN: 20.0000 mmol | Freq: Once | INTRAVENOUS | Status: DC

## 2018-07-28 MED ORDER — CHEWING GUM (ORBIT) SUGAR FREE
1.0000 | CHEWING_GUM | Freq: Four times a day (QID) | ORAL | Status: DC
  Administered 2018-07-28 – 2018-08-02 (×6): 1 via ORAL
  Filled 2018-07-28 (×3): qty 1

## 2018-07-28 MED ORDER — TRAVASOL 10 % IV SOLN
INTRAVENOUS | Status: AC
  Administered 2018-07-28: 18:00:00 via INTRAVENOUS
  Filled 2018-07-28: qty 904.51

## 2018-07-28 NOTE — Progress Notes (Signed)
Performed dressing change per orders. Pt has open area in midline of abdomen with sutures in the base of the wound, visible fat, and some clotted blood. Orders are for wet to dry with saline. Previous dressing removed: it was saturated but not dripping with serous/serosanguinous draining, more serosanguinous noted at the caudal end of the wound. I removed 4 pieces of gauze. No odor noted to wound. I carefully cleansed skin around wound which is intact without redness or rash. I packed the wound with six moist saline soaked 4x4 gauzes and covered it with an ABD pad, securing with medipore tape. Pt did have some pain, premedicated with prn Dilaudid. Active bowel sounds noted x4 with most heard in the RUQ. Pt denies passing gas yet. I noted that pt's abdomen is slightly more distended than yesterday. Pt denied any nausea whatsoever. Abdomen soft with some guarding, most pain to palpation in LUQ.

## 2018-07-28 NOTE — Plan of Care (Signed)
  Problem: Education: Goal: Knowledge of General Education information will improve Description: Including pain rating scale, medication(s)/side effects and non-pharmacologic comfort measures Outcome: Progressing   Problem: Pain Managment: Goal: General experience of comfort will improve Outcome: Progressing   Problem: Safety: Goal: Ability to remain free from injury will improve Outcome: Progressing   

## 2018-07-28 NOTE — Progress Notes (Signed)
Progress Note  Patient Name: Rachel Park Date of Encounter: 07/28/2018  Primary Cardiologist:   Mertie Moores, MD   Subjective   She has usual abdominal pain post op.  Denies palpitations or presyncope.   Inpatient Medications    Scheduled Meds: . chewing gum (ORBIT) sugar free  1 Stick Oral QID  . chlorhexidine  15 mL Mouth Rinse BID  . Chlorhexidine Gluconate Cloth  6 each Topical Daily  . enoxaparin (LOVENOX) injection  40 mg Subcutaneous Q24H  . insulin aspart  0-9 Units Subcutaneous Q4H  . levothyroxine  40 mcg Intravenous Daily  . mouth rinse  15 mL Mouth Rinse q12n4p  . metoprolol tartrate  2.5 mg Intravenous Q6H  . pantoprazole (PROTONIX) IV  40 mg Intravenous Daily  . sodium chloride flush  10-40 mL Intracatheter Q12H   Continuous Infusions: . sodium phosphate  Dextrose 5% IVPB    . TPN ADULT (ION) 56 mL/hr at 07/28/18 0229  . TPN ADULT (ION)     PRN Meds: HYDROmorphone (DILAUDID) injection, metoprolol tartrate, ondansetron **OR** ondansetron (ZOFRAN) IV, sodium chloride flush   Vital Signs    Vitals:   07/27/18 2348 07/28/18 0525 07/28/18 0528 07/28/18 0614  BP: 123/83 (!) 134/96    Pulse: (!) 104 (!) 121  85  Resp: 17 (!) 21  18  Temp:  99.2 F (37.3 C)    TempSrc:  Oral    SpO2: 95% 91%  98%  Weight:   73.7 kg   Height:        Intake/Output Summary (Last 24 hours) at 07/28/2018 1106 Last data filed at 07/28/2018 0229 Gross per 24 hour  Intake 1570.01 ml  Output 80 ml  Net 1490.01 ml   Filed Weights   07/26/18 1730 07/27/18 0500 07/28/18 0528  Weight: 69.3 kg 69.5 kg 73.7 kg    Telemetry    Atrial fib with rate 100 - 110s.   - Personally Reviewed  ECG    NA - Personally Reviewed  Physical Exam   GEN: No acute distress.   Neck: No  JVD Cardiac: Irregular RR, no murmurs, rubs, or gallops.  Respiratory: Clear  to auscultation bilaterally. GI: Soft, nontender, non-distended  MS: No edema; No deformity. Neuro:  Nonfocal  Psych:  Normal affect   Labs    Chemistry Recent Labs  Lab 07/22/18 1903  07/24/18 0353  07/26/18 0332 07/27/18 0336 07/28/18 0322  NA 134*   < > 134*   < > 139 139 139  K 4.0   < > 4.6   < > 3.6 3.7 4.1  CL 99   < > 108   < > 113* 114* 111  CO2 20*   < > 18*   < > 17* 22 23  GLUCOSE 152*   < > 146*   < > 108* 155* 138*  BUN 17   < > 21   < > 20 18 19   CREATININE 0.77   < > 0.83   < > 0.65 0.43* 0.39*  CALCIUM 9.9   < > 8.2*   < > 7.8* 7.9* 8.0*  PROT 7.4  --   --   --   --  4.4*  --   ALBUMIN 4.3  --  3.0*  --   --  2.0*  --   AST 38  --   --   --   --  21  --   ALT 42  --   --   --   --  24  --   ALKPHOS 104  --   --   --   --  44  --   BILITOT 0.8  --   --   --   --  1.0  --   GFRNONAA >60   < > >60   < > >60 >60 >60  GFRAA >60   < > >60   < > >60 >60 >60  ANIONGAP 15   < > 8   < > 9 3* 5   < > = values in this interval not displayed.     Hematology Recent Labs  Lab 07/26/18 0332 07/27/18 0336 07/28/18 0322  WBC 8.6 8.8 9.1  RBC 3.72* 3.16* 3.03*  HGB 11.5* 9.6* 9.2*  HCT 34.1* 29.7* 28.6*  MCV 91.7 94.0 94.4  MCH 30.9 30.4 30.4  MCHC 33.7 32.3 32.2  RDW 15.4 15.5 15.7*  PLT 179 172 199    Cardiac EnzymesNo results for input(s): TROPONINI in the last 168 hours. No results for input(s): TROPIPOC in the last 168 hours.   BNPNo results for input(s): BNP, PROBNP in the last 168 hours.   DDimer No results for input(s): DDIMER in the last 168 hours.   Radiology    Dg Chest Port 1 View  Result Date: 07/28/2018 CLINICAL DATA:  Acute respiratory failure EXAM: PORTABLE CHEST 1 VIEW COMPARISON:  07/26/2018 FINDINGS: Endotracheal tube and NG tube removed. Right arm PICC tip SVC unchanged. Right lower lobe airspace disease unchanged. Progression of left lower lobe airspace disease and small left effusion. Negative for edema. IMPRESSION: Endotracheal tube removed. Progression of left lower lobe airspace disease and small left effusion. Electronically Signed   By: Franchot Gallo M.D.   On: 07/28/2018 09:21    Cardiac Studies   Echo:  04/21/18   1. The left ventricle has normal systolic function, with an ejection fraction of 55-60%. The cavity size was normal. Mild basal septal hypertrophy. Left ventricular diastology could not be evaluated secondary to atrial fibrillation.  2. The right ventricle has normal systolic function. The cavity was normal. There is no increase in right ventricular wall thickness.  3. Left atrial size was moderately dilated.  4. The mitral valve is normal in structure.  5. The tricuspid valve is normal in structure. Tricuspid valve regurgitation is mild-moderate.  6. The aortic valve is tricuspid Mild sclerosis of the aortic valve.  7. The pulmonic valve was normal in structure. Pulmonic valve regurgitation is mild by color flow Doppler.  8. Right atrial pressure is estimated at 3 mmHg.   Patient Profile     85 y.o. female with atrial fib and CAD.  We are seeing her post op for atrial fib.   Assessment & Plan    Atrial Fib:  The rate is reasonably controlled.  She has the BP to go up on the IV beta blocker dose and I will increase this slightly.  It looks like one dose was held yesterday with transient hypotension.  She does have hold parameters listed.    For questions or updates, please contact [place] Please consult [email or web address] for contact info under Cardiology/STEMI.   Signed, Minus Breeding, MD  07/28/2018, 11:06 AM

## 2018-07-28 NOTE — Progress Notes (Signed)
[facility] CONSULT NOTE   Pharmacy Consult for TPN Indication: Prolonged ileus  Patient Measurements: Height: 5' 2.5" (158.8 cm) Weight: 162 lb 7.7 oz (73.7 kg) IBW/kg (Calculated) : 51.25 TPN AdjBW (KG): 54.7 Body mass index is 29.24 kg/m. Usual Weight: 63-69kg  Assessment: Rachel Park is an 85yo female admitted with hx of SBO with N/V and epigastric pain on 5/18. Later taken to OR for exploratory laparotomy and found to have necrotic bowel with resection with open abdomen followed by closure/ileocecectomy/ileocolonic anastomosis. Surgery consulted pharmacy for TPN as would like patient to have bowel rest and expects prolonged ileus.  GI: s/p bowel resection with expectation of prolonged ileus as above; 5/23 NGT came out, will allow trial without; 80 mls emesis/NGT output prior to it coming out Endo: BG 116-138 now with SSI ordered Insulin requirements in the past 24 hours: 3 units Lytes: Na 139, K 4.1, coCa 9.5, phos low at 1.5 (will replace), Mg 2, TG 76 Renal:SCr 0.65>0.43>0.39, BUN 19 Pulm: extubated on RA Cards: BP 120s/60s with HR tachy 110-120s Hepatobil:t bili 1, AST/ALT WNL Neuro: nml ID: periop abx  TPN Access: PICC line placed 5/19 TPN start date: 5/22 Nutritional Goals (per RD recommendation on 07/26/2018):  Kcal: 1500-1700 Protein: 85-105 grams Fluid: >/= 1.5 L  Goal TPN rate is 56 ml/hr (provides 90g protein and 306 g dextrose)  Current Nutrition:   Plan:  Increase TPN to 56 mL/hr. Hold 20% lipid emulsion for first 7 days for ICU patients per ASPEN guidelines (Start date 08/02/2018) This TPN provides provides 90g protein and 306 g dextrose which provides 1400 kCals per day, meeting ~95% of patient needs Electrolytes in TPN: standard, except will incr phos today -Give 30 mmol Naphos x 1 - D/c NS infusion Add MVI, trace elements MWF to TPN SSI and adjust as needed Monitor TPN labs  Thank you for involving pharmacy in this  patient's care.  Alanda Slim, PharmD, [facility] Clinical Pharmacist Please see AMION for all Pharmacists' Contact Phone Numbers 07/28/2018, 7:46 AM

## 2018-07-28 NOTE — Progress Notes (Addendum)
3 Days Post-Op   Subjective/Chief Complaint: Slept well, no flatus, no nausea   Objective: Vital signs in last 24 hours: Temp:  [98.5 F (36.9 C)-99.2 F (37.3 C)] 99.2 F (37.3 C) (05/24 0525) Pulse Rate:  [81-121] 85 (05/24 0614) Resp:  [0-21] 18 (05/24 0614) BP: (90-134)/(54-96) 134/96 (05/24 0525) SpO2:  [91 %-98 %] 98 % (05/24 0614) Weight:  [73.7 kg] 73.7 kg (05/24 0528) Last BM Date: (PTA)  Intake/Output from previous day: 05/23 0701 - 05/24 0700 In: 1919.2 [I.V.:1167.5; NG/GT:30; IV Piggyback:721.7] Out: 205 [Urine:125; Emesis/NG output:80] Intake/Output this shift: No intake/output data recorded.  General appearance: alert and cooperative Resp: clear to auscultation bilaterally Cardio: irregularly irregular rhythm GI: soft, wound OK, few BS  Lab Results:  Recent Labs    07/27/18 0336 07/28/18 0322  WBC 8.8 9.1  HGB 9.6* 9.2*  HCT 29.7* 28.6*  PLT 172 199   BMET Recent Labs    07/27/18 0336 07/28/18 0322  NA 139 139  K 3.7 4.1  CL 114* 111  CO2 22 23  GLUCOSE 155* 138*  BUN 18 19  CREATININE 0.43* 0.39*  CALCIUM 7.9* 8.0*   PT/INR No results for input(s): LABPROT, INR in the last 72 hours. ABG No results for input(s): PHART, HCO3 in the last 72 hours.  Invalid input(s): PCO2, PO2  Studies/Results: No results found.  Anti-infectives: Anti-infectives (From admission, onward)   Start     Dose/Rate Route Frequency Ordered Stop   07/25/18 1000  ceFAZolin (ANCEF) IVPB 2g/100 mL premix     2 g 200 mL/hr over 30 Minutes Intravenous To Short Stay 07/25/18 0749 07/25/18 1200   07/25/18 0745  ceFAZolin (ANCEF) IVPB 2g/100 mL premix  Status:  Discontinued     2 g 200 mL/hr over 30 Minutes Intravenous  Once 07/25/18 0735 07/25/18 0749   07/23/18 1000  cefoTEtan (CEFOTAN) 1 g in sodium chloride 0.9 % 100 mL IVPB     1 g 200 mL/hr over 30 Minutes Intravenous  Once 07/23/18 0825 07/23/18 1117      Assessment/Plan: POD 5/3 elap, sbr, open  abdomen followed by closure/ileocecectomy/ileocolonic anastomosis-[place] -npo except ice, sips with meds -Await bowel function -TNA -dressing changes Afib -hold anticoagulation -proph lovenox only for now   LOS: 6 days    Zenovia Jarred 07/28/2018

## 2018-07-28 NOTE — Progress Notes (Signed)
Physical Therapy Treatment Patient Details Name: Rachel Park MRN: [id number] DOB: 11/14/1931 Today's Date: 07/28/2018    History of Present Illness 48 yof with SBO, failed medical management, taken to OR with findings of necrotic bowel s/p resection.  Abd left open with wound vac and returns to ICU on mechanical ventilation. Extubated 07/25/18    PT Comments    Pt limited by pain and fatigue this session, also hallucinatory stating there was a cat and a man in the room. RN notified and stated pt received Dilaudid prior. However, pt still agreeable and able to participate in therapy session. Requiring moderate assistance for transfers and ambulating 6 feet with walker and min assist. Tachycardic with mobility, HR peak 140's. Continues with decreased cognition, balance impairments, weakness, and decreased endurance. Continue to recommend comprehensive inpatient rehab (CIR) for post-acute therapy needs.     Follow Up Recommendations  CIR     Equipment Recommendations  Rolling walker with 5" wheels    Recommendations for Other Services       Precautions / Restrictions Precautions Precautions: Fall Restrictions Weight Bearing Restrictions: No    Mobility  Bed Mobility Overal bed mobility: Needs Assistance Bed Mobility: Supine to Sit;Sit to Supine     Supine to sit: Mod assist Sit to supine: Mod assist   General bed mobility comments: ModA for trunk elevation to sit edge of bed. ModA for BLE negotiation back into bed  Transfers Overall transfer level: Needs assistance Equipment used: Rolling walker (2 wheeled) Transfers: Sit to/from Stand Sit to Stand: +2 physical assistance;Mod assist         General transfer comment: ModA for lifting   Ambulation/Gait Ambulation/Gait assistance: Min assist Gait Distance (Feet): 6 Feet Assistive device: Rolling walker (2 wheeled) Gait Pattern/deviations: Step-through pattern;Decreased step length - right;Decreased step length -  left;Trunk flexed;Narrow base of support;Antalgic;Shuffle Gait velocity: slowed Gait velocity interpretation: <1.31 ft/sec, indicative of household ambulator General Gait Details: Pt requiring min assist for walker negotiation with turns and obstacles, frequently running into objects. very slow speed, tending to step in place rather than progress forwards. max cues provided for direction, sequencing, and increasing step cadence, pt unable to correct   Stairs             Wheelchair Mobility    Modified Rankin (Stroke Patients Only)       Balance Overall balance assessment: Needs assistance Sitting-balance support: No upper extremity supported;Feet supported Sitting balance-Leahy Scale: Fair     Standing balance support: Bilateral upper extremity supported Standing balance-Leahy Scale: Poor Standing balance comment: requires UE support and outside assist                            Cognition Arousal/Alertness: Awake/alert Behavior During Therapy: Flat affect Overall Cognitive Status: Impaired/Different from baseline Area of Impairment: Attention;Awareness;Orientation;Following commands;Problem solving                 Orientation Level: Disoriented to;Place;Time;Situation Current Attention Level: Focused   Following Commands: Follows one step commands with increased time;Follows one step commands inconsistently   Awareness: Intellectual Problem Solving: Slow processing;Requires verbal cues General Comments: Pt hallucinating, stating there was a cat and a man in the room and [place]. RN notified; pt received dilaudid prior to treatment      Exercises General Exercises - Lower Extremity Ankle Circles/Pumps: 20 reps;Both;Supine Long Arc Quad: 10 reps;Both;Seated Heel Slides: 10 reps;Both;Supine    General  Comments        Pertinent Vitals/Pain Pain Assessment: Faces Faces Pain Scale: Hurts whole lot Pain Location: abdomen Pain  Descriptors / Indicators: Aching;Sore;Grimacing Pain Intervention(s): Monitored during session;Premedicated before session;Limited activity within patient's tolerance    Home Living                      Prior Function            PT Goals (current goals can now be found in the care plan section) Acute Rehab PT Goals Patient Stated Goal: "walk." PT Goal Formulation: With patient Time For Goal Achievement: 08/08/18 Potential to Achieve Goals: Good Progress towards PT goals: Progressing toward goals    Frequency    Min 3X/week      PT Plan Equipment recommendations need to be updated    Co-evaluation              AM-PAC PT "6 Clicks" Mobility   Outcome Measure  Help needed turning from your back to your side while in a flat bed without using bedrails?: A Lot Help needed moving from lying on your back to sitting on the side of a flat bed without using bedrails?: A Lot Help needed moving to and from a bed to a chair (including a wheelchair)?: A Lot Help needed standing up from a chair using your arms (e.g., wheelchair or bedside chair)?: A Lot Help needed to walk in hospital room?: A Little Help needed climbing 3-5 steps with a railing? : Total 6 Click Score: 12    End of Session Equipment Utilized During Treatment: Oxygen;Gait belt Activity Tolerance: Patient limited by fatigue Patient left: with call bell/phone within reach;in bed;with bed alarm set Nurse Communication: Mobility status PT Visit Diagnosis: Muscle weakness (generalized) (M62.81)     Time: 6378-5885 PT Time Calculation (min) (ACUTE ONLY): 26 min  Charges:  $Gait Training: 8-22 mins $Therapeutic Exercise: 8-22 mins                     Ellamae Sia, PT, DPT Acute Rehabilitation Services Pager [phone] Office [phone]    Willy Eddy 07/28/2018, 5:05 PM

## 2018-07-28 NOTE — Progress Notes (Signed)
Family Medicine Teaching Service Daily Progress Note Intern Pager: [phone]  Patient name: Rachel Park Medical record number: [id number] Date of birth: [birth year] Age: 85 y.o. Gender: female  Primary Care Provider: Lajean Manes, MD Consultants: CCM, general surgery, cardiology Code Status: full  Pt Overview and Major Events to Date:  5/18 admitted to [facility] 5/19 ex lap, underwent colonic resection 5/21 underwent ileocolic anastomosis 1/96 extubated 5/24 out to floor, care resumed by FPTS  Assessment and Plan: 85 year old female who presented with severe abdominal pain.  Found to have ischemic bowel on ex lap, bowel resected and underwent reanastomosis.  Has developed ileus postoperatively, extubated on 5/22 and transferred out of floor on 5/24.  Post-operative Ileus Status post ileocolic anastomosis.  Patient still with no bowel function.  Some slight return of bowel sounds.  Ambulated with PT/OT on 5/23.  Now feels a little sore, which is expected.  Receiving TPN.  Tolerating sips with meds and few ice chips.  General surgery following appreciate their recommendations.  Continuing IV medications, will transition to p.o. when able. -Vital signs per floor routine -Continue to ambulate with PT/OT, can receive as needed pain medication prior to ambulating -TPN, pharmacy to dose, 56 mL/h at current time -Placed order to allow patient to chew gum -Continue Protonix 40 mg IV daily for prophylaxis -Continue Lovenox 40 mg daily for DVT prophylaxis -Zofran PRN -Ordered incentive spirometer for room, nursing to educate on how to use  Status post ileocolic anastomosis secondary to ischemic bowel General surgery following appreciate their recommendations.  Still awaiting return to bowel function, discussed above.  Continue TPA, discussed above.  We will continue to hold Eliquis, receiving prophylactic Lovenox.  Ambulate as tolerated.  - Daily CBC, BMP - surgery following, appreciate  recommendations  History of A. Fib Heart rate 120s on telemetry.  Cardiology following appreciate the recommendations.  Per their note can increase scheduled Lopressor slightly.  Also has 5 mg every 6 hours PRN ordered.  Will get repeat EKG on 5/25.  Continue to monitor electrolytes, especially in setting of TPN. -Restart DOAC when okay with surgery -Continue Lovenox for DVT prophylaxis -Daily BMP  Small left pleural effusion Unclear etiology.  Likely secondary to atelectasis.  No progression from last x-ray.  We will continue to monitor for fever or leukocytosis.  Incentive spirometer ordered for patient room.  Hypophosphatemia Phosphorus 1.5 from 1.4.  Will repeat with 30 mEq sodium phosphate.  Recheck in a.m.  Hypocalcemia Calcium 8.0 from 7.9.  When corrected for low albumin calcium is 9.6 which is in the normal range.  Expect albumin to improve once able to tolerate better p.o.  Monitor with daily calcium  Normocytic anemia Hemoglobin 9.2 from 9.6, MCV 94.4.  Likely secondary to blood loss during surgery.  We will continue to follow daily CBC.   FEN/GI: N.p.o., sips with meds and ice chips allowed. PPx: Pantoprazole 40 mg, Lovenox  Disposition: Likely CIR pending clinical course  Subjective:  Doing well this a.m.  Feels a little sore and more tired after activity yesterday.  Not passing any flatus or having bowel movements.  Has had no nausea or significant bloating to report.  Objective: Temp:  [98.8 F (37.1 C)-99.2 F (37.3 C)] 99.2 F (37.3 C) (05/24 0525) Pulse Rate:  [85-121] 85 (05/24 0614) Resp:  [15-21] 18 (05/24 0614) BP: (106-134)/(76-96) 134/96 (05/24 0525) SpO2:  [91 %-98 %] 98 % (05/24 0614) Weight:  [73.7 kg] 73.7 kg (05/24 0528) Physical Exam: General: Well-appearing, no  acute distress, painful winces with laugh or cough Cardiovascular: Regular rhythm, tachycardic, palpable radial pulse bilaterally.  Previous radial arterial line site  well-appearing. Respiratory: Lungs clear to auscultation bilaterally, no accessory muscle use, no wheezing Abdomen: Soft, tender around incision site, otherwise nontender.  Bowel sounds present.  Clean dry intact dressing covering ex lap wound. Extremities: No focal swelling noticed, warm and dry  Laboratory: Recent Labs  Lab 07/26/18 0332 07/27/18 0336 07/28/18 0322  WBC 8.6 8.8 9.1  HGB 11.5* 9.6* 9.2*  HCT 34.1* 29.7* 28.6*  PLT 179 172 199   Recent Labs  Lab 07/22/18 1903  07/26/18 0332 07/27/18 0336 07/28/18 0322  NA 134*   < > 139 139 139  K 4.0   < > 3.6 3.7 4.1  CL 99   < > 113* 114* 111  CO2 20*   < > 17* 22 23  BUN 17   < > 20 18 19   CREATININE 0.77   < > 0.65 0.43* 0.39*  CALCIUM 9.9   < > 7.8* 7.9* 8.0*  PROT 7.4  --   --  4.4*  --   BILITOT 0.8  --   --  1.0  --   ALKPHOS 104  --   --  44  --   ALT 42  --   --  24  --   AST 38  --   --  21  --   GLUCOSE 152*   < > 108* 155* 138*   < > = values in this interval not displayed.    Imaging/Diagnostic Tests: CLINICAL DATA:  Acute respiratory failure  EXAM: PORTABLE CHEST 1 VIEW  COMPARISON:  07/26/2018  FINDINGS: Endotracheal tube and NG tube removed. Right arm PICC tip SVC unchanged.  Right lower lobe airspace disease unchanged. Progression of left lower lobe airspace disease and small left effusion. Negative for edema.  IMPRESSION: Endotracheal tube removed. Progression of left lower lobe airspace disease and small left effusion.  Guadalupe Dawn, MD 07/28/2018, 11:51 AM PGY-2, [place] Intern pager: [phone], text pages welcome

## 2018-07-28 NOTE — Progress Notes (Addendum)
Pain medication given again at 1430 - pt having increased pain in abdomen. Her abdomen is still soft, bowel sounds are decreased. She remains not nauseated. Chewing gum provided, having ice. BP elevated, metoprolol given per MAR. Will continue to monitor.  BP (!) 147/120   Pulse (!) 114   Temp 99.2 F (37.3 C) (Oral)   Resp 19   Ht 5' 2.5" (1.588 m)   Wt 73.7 kg   SpO2 99%   BMI 29.24 kg/m

## 2018-07-28 NOTE — Progress Notes (Addendum)
Family Medicine Teaching Service Daily Progress Note Intern Pager: [phone]  Patient name: Rachel Park Medical record number: [id number] Date of birth: [birth year] Age: 85 y.o. Gender: female  Primary Care Provider: Lajean Manes, MD Consultants: CCM, gen surg, cardiology Code Status: full  Pt Overview and Major Events to Date:  5/18 admitted to [facility] 5/19 ex lap, underwent colonic resection 5/21 underwent ileocolic anastomosis 5/02 extubated 5/24 out to floor, care resumed by FPTS  Assessment and Plan: 85 year old female who presented with severe abdominal pain.  Found to have ischemic bowel on ex lap, bowel resected and underwent reanastomosis.  Has developed ileus postoperatively, extubated on 5/22 and transferred out of floor on 5/24.  Post-operative Ileus Status post ileocolic anastomosis. Not passing flatus, no BM. No bowel sounds. .    Receiving TPN.   General surgery following appreciate their recommendations. Will get PT to work with patient.   Continuing IV medications, will transition to p.o. when able. -Vital signs per floor routine -Continue to ambulate with PT/OT, can receive as needed pain medication prior to ambulating -TPN, pharmacy to dose, 70 mL/h at current time -Zofran PRN - dilaudid 1mg  q3h PRN - tylenol q6h  Status post ileocolic anastomosis secondary to ischemic bowel General surgery following appreciate their recommendations.  Still awaiting return to bowel function, discussed above.  Continue TPA, discussed above.  We will continue to hold Eliquis, receiving prophylactic Lovenox.  Ambulate as tolerated.  - Daily CBC, BMP - surgery following, appreciate recommendations  History of A. Fib  Cardiology following appreciate the recommendations.  Per their note can increase scheduled Lopressor slightly.  Also has 5 mg every 6 hours PRN ordered.  HR in 120s today.   Continue to monitor electrolytes, especially in setting of TPN. - metop 5mg  IV q6h, w/ 2.5mg   PRN q4h.  -Restart DOAC when okay with surgery -Continue Lovenox for DVT prophylaxis until DOAC started.  -Daily BMP  Small left pleural effusion Unclear etiology.  Likely secondary to atelectasis.  No progression from last x-ray.  We will continue to monitor for fever or leukocytosis.   - incentive spirometry  Hypothyroidism -  Pt takes 70mcg at home daily.  - transitioned from IV to PO synthroid today  Hypophosphatemia Phosphorus 2.5 today.    Hypocalcemia Calcium 7.9>8.0>7.7.  When corrected for low albumin calcium is in the normal range.  Expect albumin to improve once able to tolerate better p.o.  - Monitor with daily calcium  Normocytic anemia Hemoglobin 9.7 from 9.6, normocytic.  Likely secondary to blood loss during surgery.  We will continue to follow daily CBC.   FEN/GI: N.p.o., sips with meds and ice chips allowed. PPx: Pantoprazole 40 mg, Lovenox  Disposition: likely SNF  Subjective:  Pt says her pain is well controlled with the pain medication she is on.  She still has no flatus, BM.  She is unsure about taking the foley out if it would cause the nurses to have to come in too often. She is agreeable to PT, she was just too tired when they came last time.   Objective: Temp:  [97.8 F (36.6 C)-99.2 F (37.3 C)] 97.8 F (36.6 C) (05/24 2016) Pulse Rate:  [85-126] 126 (05/24 1704) Resp:  [15-21] 15 (05/24 1704) BP: (123-154)/(83-120) 154/100 (05/24 1704) SpO2:  [91 %-99 %] 96 % (05/24 1704) Weight:  [73.7 kg] 73.7 kg (05/24 0528) Physical Exam: General: alert, oriented.  No acute distress Cardiovascular: irregularly irregular rhythm.  No murmurs.  Tachycardic rate.  Respiratory: LCTAB.  No wheezes or crackles.  Abdomen: recent abdominal surgery covered with dressing.  Tender to palpation along the incision.     Laboratory: Recent Labs  Lab 07/26/18 0332 07/27/18 0336 07/28/18 0322  WBC 8.6 8.8 9.1  HGB 11.5* 9.6* 9.2*  HCT 34.1* 29.7* 28.6*  PLT  179 172 199   Recent Labs  Lab 07/22/18 1903  07/26/18 0332 07/27/18 0336 07/28/18 0322  NA 134*   < > 139 139 139  K 4.0   < > 3.6 3.7 4.1  CL 99   < > 113* 114* 111  CO2 20*   < > 17* 22 23  BUN 17   < > 20 18 19   CREATININE 0.77   < > 0.65 0.43* 0.39*  CALCIUM 9.9   < > 7.8* 7.9* 8.0*  PROT 7.4  --   --  4.4*  --   BILITOT 0.8  --   --  1.0  --   ALKPHOS 104  --   --  44  --   ALT 42  --   --  24  --   AST 38  --   --  21  --   GLUCOSE 152*   < > 108* 155* 138*   < > = values in this interval not displayed.      Benay Pike, MD 07/28/2018, 10:36 PM PGY-1, [place] Intern pager: [phone], text pages welcome

## 2018-07-28 NOTE — Progress Notes (Signed)
Family medicine progress update  Patient about mild increase in pain in abdomen.  Per nursing report patient is describing this pain as around her diaphragm area in her upper abdomen.  Believe this pain is consistent with increased activity after rehab on 5/23 and likely represents medical soreness.  Okay to give next dose of pain medication 20 minutes early.  Patient continues to have worsening pain will likely get abdominal x-ray to make sure there is not some newly developing abdominal process.  Patient's abdomen still soft, with hypoactive bowel sounds at this time.  We will continue to monitor patient closely.  Guadalupe Dawn MD PGY-2 Family Medicine Resident

## 2018-07-29 DIAGNOSIS — R410 Disorientation, unspecified: Secondary | ICD-10-CM

## 2018-07-29 DIAGNOSIS — R5381 Other malaise: Secondary | ICD-10-CM

## 2018-07-29 DIAGNOSIS — I5031 Acute diastolic (congestive) heart failure: Secondary | ICD-10-CM

## 2018-07-29 LAB — CBC
HCT: 29.8 % — ABNORMAL LOW (ref 36.0–46.0)
Hemoglobin: 9.7 g/dL — ABNORMAL LOW (ref 12.0–15.0)
MCH: 30.3 pg (ref 26.0–34.0)
MCHC: 32.6 g/dL (ref 30.0–36.0)
MCV: 93.1 fL (ref 80.0–100.0)
Platelets: 233 10*3/uL (ref 150–400)
RBC: 3.2 MIL/uL — ABNORMAL LOW (ref 3.87–5.11)
RDW: 15.5 % (ref 11.5–15.5)
WBC: 10.5 10*3/uL (ref 4.0–10.5)
nRBC: 0 % (ref 0.0–0.2)

## 2018-07-29 LAB — DIFFERENTIAL
Abs Immature Granulocytes: 0.17 10*3/uL — ABNORMAL HIGH (ref 0.00–0.07)
Basophils Absolute: 0 10*3/uL (ref 0.0–0.1)
Basophils Relative: 0 %
Eosinophils Absolute: 0.2 10*3/uL (ref 0.0–0.5)
Eosinophils Relative: 2 %
Immature Granulocytes: 2 %
Lymphocytes Relative: 8 %
Lymphs Abs: 0.8 10*3/uL (ref 0.7–4.0)
Monocytes Absolute: 1 10*3/uL (ref 0.1–1.0)
Monocytes Relative: 9 %
Neutro Abs: 8.3 10*3/uL — ABNORMAL HIGH (ref 1.7–7.7)
Neutrophils Relative %: 79 %

## 2018-07-29 LAB — COMPREHENSIVE METABOLIC PANEL
ALT: 20 U/L (ref 0–44)
AST: 13 U/L — ABNORMAL LOW (ref 15–41)
Albumin: 1.9 g/dL — ABNORMAL LOW (ref 3.5–5.0)
Alkaline Phosphatase: 50 U/L (ref 38–126)
Anion gap: 8 (ref 5–15)
BUN: 14 mg/dL (ref 8–23)
CO2: 25 mmol/L (ref 22–32)
Calcium: 7.7 mg/dL — ABNORMAL LOW (ref 8.9–10.3)
Chloride: 102 mmol/L (ref 98–111)
Creatinine, Ser: 0.32 mg/dL — ABNORMAL LOW (ref 0.44–1.00)
GFR calc Af Amer: >60 mL/min (ref >60)
GFR calc non Af Amer: >60 mL/min (ref >60)
Glucose, Bld: 121 mg/dL — ABNORMAL HIGH (ref 70–99)
Potassium: 3.5 mmol/L (ref 3.5–5.1)
Sodium: 135 mmol/L (ref 135–145)
Total Bilirubin: 0.8 mg/dL (ref 0.3–1.2)
Total Protein: 4.5 g/dL — ABNORMAL LOW (ref 6.5–8.1)

## 2018-07-29 LAB — GLUCOSE, CAPILLARY
Glucose-Capillary: 110 mg/dL — ABNORMAL HIGH (ref 70–99)
Glucose-Capillary: 115 mg/dL — ABNORMAL HIGH (ref 70–99)
Glucose-Capillary: 117 mg/dL — ABNORMAL HIGH (ref 70–99)
Glucose-Capillary: 119 mg/dL — ABNORMAL HIGH (ref 70–99)
Glucose-Capillary: 124 mg/dL — ABNORMAL HIGH (ref 70–99)
Glucose-Capillary: 139 mg/dL — ABNORMAL HIGH (ref 70–99)

## 2018-07-29 LAB — PHOSPHORUS: Phosphorus: 2.5 mg/dL (ref 2.5–4.6)

## 2018-07-29 LAB — TRIGLYCERIDES: Triglycerides: 103 mg/dL (ref <150)

## 2018-07-29 LAB — PREALBUMIN: Prealbumin: 6.8 mg/dL — ABNORMAL LOW (ref 18–38)

## 2018-07-29 LAB — MAGNESIUM: Magnesium: 1.7 mg/dL (ref 1.7–2.4)

## 2018-07-29 MED ORDER — TRAVASOL 10 % IV SOLN
INTRAVENOUS | Status: AC
  Administered 2018-07-29: 18:00:00 via INTRAVENOUS
  Filled 2018-07-29: qty 991.2

## 2018-07-29 MED ORDER — PANTOPRAZOLE SODIUM 40 MG PO TBEC
40.0000 mg | DELAYED_RELEASE_TABLET | Freq: Every day | ORAL | Status: DC
  Administered 2018-07-30 – 2018-08-12 (×14): 40 mg via ORAL
  Filled 2018-07-29 (×14): qty 1

## 2018-07-29 MED ORDER — POTASSIUM CHLORIDE 10 MEQ/50ML IV SOLN
10.0000 meq | INTRAVENOUS | Status: AC
  Administered 2018-07-29 (×3): 10 meq via INTRAVENOUS
  Filled 2018-07-29 (×3): qty 50

## 2018-07-29 MED ORDER — LEVOTHYROXINE SODIUM 75 MCG PO TABS
75.0000 ug | ORAL_TABLET | Freq: Every day | ORAL | Status: DC
  Administered 2018-07-30 – 2018-08-12 (×14): 75 ug via ORAL
  Filled 2018-07-29 (×14): qty 1

## 2018-07-29 MED ORDER — FUROSEMIDE 10 MG/ML IJ SOLN
40.0000 mg | Freq: Once | INTRAMUSCULAR | Status: AC
  Administered 2018-07-29: 40 mg via INTRAVENOUS
  Filled 2018-07-29: qty 4

## 2018-07-29 MED ORDER — METOPROLOL TARTRATE 5 MG/5ML IV SOLN
5.0000 mg | Freq: Every day | INTRAVENOUS | Status: DC
  Administered 2018-07-29 – 2018-07-31 (×11): 5 mg via INTRAVENOUS
  Filled 2018-07-29 (×12): qty 5

## 2018-07-29 MED ORDER — ACETAMINOPHEN 325 MG PO TABS
650.0000 mg | ORAL_TABLET | Freq: Four times a day (QID) | ORAL | Status: DC
  Administered 2018-07-29 – 2018-07-31 (×7): 650 mg via ORAL
  Filled 2018-07-29 (×7): qty 2

## 2018-07-29 MED ORDER — MAGNESIUM SULFATE 2 GM/50ML IV SOLN
2.0000 g | Freq: Once | INTRAVENOUS | Status: AC
  Administered 2018-07-29: 2 g via INTRAVENOUS
  Filled 2018-07-29: qty 50

## 2018-07-29 MED ORDER — METOPROLOL TARTRATE 5 MG/5ML IV SOLN: 5.0000 mg | Freq: Every day | INTRAVENOUS | Status: DC

## 2018-07-29 MED ORDER — METOPROLOL TARTRATE 5 MG/5ML IV SOLN: 5.0000 mg | Freq: Four times a day (QID) | INTRAVENOUS | Status: DC

## 2018-07-29 MED ORDER — METOPROLOL TARTRATE 5 MG/5ML IV SOLN
2.5000 mg | INTRAVENOUS | Status: DC | PRN
  Administered 2018-07-31 (×2): 2.5 mg via INTRAVENOUS
  Filled 2018-07-29: qty 5

## 2018-07-29 NOTE — Care Management Important Message (Signed)
Important Message  Patient Details  Name: Rachel Park MRN: [id number] Date of Birth: Aug 30, 1931   Medicare Important Message Given:  Yes    [name] Montine Circle 07/29/2018, 3:31 PM

## 2018-07-29 NOTE — Progress Notes (Addendum)
Progress Note  Patient Name: Rachel Park Date of Encounter: 07/29/2018  Primary Cardiologist: Mertie Moores, MD   Subjective   Breathing is fair.  No CP   Abd sore    COnfused last night about time  Inpatient Medications    Scheduled Meds: . chewing gum (ORBIT) sugar free  1 Stick Oral QID  . chlorhexidine  15 mL Mouth Rinse BID  . Chlorhexidine Gluconate Cloth  6 each Topical Daily  . enoxaparin (LOVENOX) injection  40 mg Subcutaneous Q24H  . insulin aspart  0-9 Units Subcutaneous Q4H  . levothyroxine  40 mcg Intravenous Daily  . mouth rinse  15 mL Mouth Rinse q12n4p  . metoprolol tartrate  2.5 mg Intravenous Q6H  . pantoprazole (PROTONIX) IV  40 mg Intravenous Daily  . sodium chloride flush  10-40 mL Intracatheter Q12H   Continuous Infusions: . TPN ADULT (ION) 56 mL/hr at 07/28/18 1803   PRN Meds: HYDROmorphone (DILAUDID) injection, metoprolol tartrate, ondansetron **OR** ondansetron (ZOFRAN) IV, sodium chloride flush   Vital Signs    Vitals:   07/29/18 0033 07/29/18 0418 07/29/18 0534 07/29/18 0758  BP:   139/83   Pulse:   93   Resp:   (!) 24   Temp: 98.7 F (37.1 C) 99.3 F (37.4 C) 98.9 F (37.2 C) 98 F (36.7 C)  TempSrc:   Oral Oral  SpO2:   98%   Weight:      Height:        Intake/Output Summary (Last 24 hours) at 07/29/2018 0827 Last data filed at 07/28/2018 2257 Gross per 24 hour  Intake 712.27 ml  Output 1850 ml  Net -1137.73 ml   Net I/O   8.3 L   POSITIVE   Last 3 Weights 07/28/2018 07/27/2018 07/26/2018  Weight (lbs) 162 lb 7.7 oz 153 lb 3.5 oz 152 lb 12.5 oz  Weight (kg) 73.7 kg 69.5 kg 69.3 kg      Telemetry     Afib  90s to 120s  AVerage around 100 - Personally Reviewed  ECG   No EKG today to review  Physical Exam   GEN: No acute distress.   Neck: JVP is increased Cardiac: Irreg irreg   no murmurs, rubs, or gallops.  Respiratory: Clear to auscultation bilaterally. GI: Sl distended    No BA MS:  Tr edema;   Shins with  bandages   No deformity. Neuro:  Nonfocal  Psych: Normal affect   Labs    Chemistry Recent Labs  Lab 07/22/18 1903  07/24/18 0353  07/27/18 0336 07/28/18 0322 07/29/18 0403  NA 134*   < > 134*   < > 139 139 135  K 4.0   < > 4.6   < > 3.7 4.1 3.5  CL 99   < > 108   < > 114* 111 102  CO2 20*   < > 18*   < > 22 23 25   GLUCOSE 152*   < > 146*   < > 155* 138* 121*  BUN 17   < > 21   < > 18 19 14   CREATININE 0.77   < > 0.83   < > 0.43* 0.39* 0.32*  CALCIUM 9.9   < > 8.2*   < > 7.9* 8.0* 7.7*  PROT 7.4  --   --   --  4.4*  --  4.5*  ALBUMIN 4.3  --  3.0*  --  2.0*  --  1.9*  AST 38  --   --   --  21  --  13*  ALT 42  --   --   --  24  --  20  ALKPHOS 104  --   --   --  44  --  50  BILITOT 0.8  --   --   --  1.0  --  0.8  GFRNONAA >60   < > >60   < > >60 >60 >60  GFRAA >60   < > >60   < > >60 >60 >60  ANIONGAP 15   < > 8   < > 3* 5 8   < > = values in this interval not displayed.     Hematology Recent Labs  Lab 07/27/18 0336 07/28/18 0322 07/29/18 0403  WBC 8.8 9.1 10.5  RBC 3.16* 3.03* 3.20*  HGB 9.6* 9.2* 9.7*  HCT 29.7* 28.6* 29.8*  MCV 94.0 94.4 93.1  MCH 30.4 30.4 30.3  MCHC 32.3 32.2 32.6  RDW 15.5 15.7* 15.5  PLT 172 199 233    Cardiac EnzymesNo results for input(s): TROPONINI in the last 168 hours. No results for input(s): TROPIPOC in the last 168 hours.   BNPNo results for input(s): BNP, PROBNP in the last 168 hours.   DDimer No results for input(s): DDIMER in the last 168 hours.   Radiology    Dg Chest Port 1 View  Result Date: 07/28/2018 CLINICAL DATA:  Acute respiratory failure EXAM: PORTABLE CHEST 1 VIEW COMPARISON:  07/26/2018 FINDINGS: Endotracheal tube and NG tube removed. Right arm PICC tip SVC unchanged. Right lower lobe airspace disease unchanged. Progression of left lower lobe airspace disease and small left effusion. Negative for edema. IMPRESSION: Endotracheal tube removed. Progression of left lower lobe airspace disease and small left  effusion. Electronically Signed   By: Franchot Gallo M.D.   On: 07/28/2018 09:21    Cardiac Studies   Echo:  04/21/18  1. The left ventricle has normal systolic function, with an ejection fraction of 55-60%. The cavity size was normal. Mild basal septal hypertrophy. Left ventricular diastology could not be evaluated secondary to atrial fibrillation. 2. The right ventricle has normal systolic function. The cavity was normal. There is no increase in right ventricular wall thickness. 3. Left atrial size was moderately dilated. 4. The mitral valve is normal in structure. 5. The tricuspid valve is normal in structure. Tricuspid valve regurgitation is mild-moderate. 6. The aortic valve is tricuspid Mild sclerosis of the aortic valve. 7. The pulmonic valve was normal in structure. Pulmonic valve regurgitation is mild by color flow Doppler. 8. Right atrial pressure is estimated at 3 mmHg.    Patient Profile     85 y.o. 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  Found to have SBO from internal hernia  Underwent resection and then reanastomosis.   Has been in atrial fibrillation  Assessment & Plan    1.   Atrial fibrillation  Patient remains in afib   Rates not optimal.  I would recomm increasing metoprolol   COuld use IV diltiazem  I would not recomm amiodarone.   Has not been on anticoag for 1 wk at minimum   DO not want to cardiovert pt with medicine   (had renal infarct this winter    2   CAD  Acth in 2018  Patent stent to LAD (placed in 2016)  3  Acute diastolic CHF  Volume is up on exam Has gotten IV fluids     I would give lasix x 1  Follow urine ourut  Output  Dosing depends on response and BP  4  Renal:  Renal infarct (cannot exclude mass, repeat Ct recommended)  in Feb 2020 when not  on anticoagulation    Cr down from admit  Most likely dilutional   DIurese     For questions or updates, please contact [facility] Please consult [email or web address] for contact info under        Signed, Dorris Carnes, MD  07/29/2018, 8:27 AM

## 2018-07-29 NOTE — Progress Notes (Signed)
[facility] CONSULT NOTE   Pharmacy Consult for TPN Indication: Prolonged ileus  Patient Measurements: Height: 5' 2.5" (158.8 cm) Weight: 162 lb 7.7 oz (73.7 kg) IBW/kg (Calculated) : 51.25 TPN AdjBW (KG): 54.7 Body mass index is 29.24 kg/m. Usual Weight: 63-69kg  Assessment: Rachel Park is an 85yo female admitted with hx of SBO with N/V and epigastric pain on 5/18. Later taken to OR for exploratory laparotomy and found to have necrotic bowel with resection with open abdomen followed by closure/ileocecectomy/ileocolonic anastomosis. Surgery consulted pharmacy for TPN as would like patient to have bowel rest and expects prolonged ileus.  GI: s/p bowel resection with expectation of prolonged ileus as above; 5/23 NGT came out, will allow trial without; 80 mls emesis/NGT output prior to it coming out Prealb 6.8 Endo: BG < 140s now with SSI ordered Insulin requirements in the past 24 hours: 3 units Lytes: K 3.5 (goal > 4) Cor Cal ok Mg 1.7 (goal > 2) Renal:SCr 0.32 good UOP Pulm: extubated on RA Cards: BP 120s/60s with HR tachy 110-120s Hepatobil:t bili 0.8, AST/ALT WNL Trigs 103 Neuro: nml ID: periop abx  TPN Access: PICC line placed 5/19 TPN start date: 5/22 Nutritional Goals (per RD recommendation on 07/26/2018):  Kcal: 1500-1700 Protein: 85-105 grams Fluid: >/= 1.5 L  Current Nutrition: TPN  Plan:  Advance TPN to goal 70 mL/hr, also add lipids as patient out of the ICU  This TPN provides 100 g protein, 44 g lipids, and total of 1634 cal/day; meeting 100% of patient needs  Electrolytes in TPN: standard, continue inc phos (will not adjust today due to advancement to goal) Continue MVI, trace elements MWF only due to shortage Continue SSI q4h - potentially could dec to q6 if remains stable  Mag 2 g x 1 KCl 10 mEq x 3  Monitor TPN labs; Bmet Mg in AM  Rachel Park, PharmD, BCPS, BCCCP Clinical Pharmacist [phone]  Please check AMION  for all [place] numbers  07/29/2018 8:30 AM

## 2018-07-29 NOTE — Progress Notes (Signed)
4 Days Post-Op   Subjective/Chief Complaint: No flatus, did not sleep as well, hoping to get up today   Objective: Vital signs in last 24 hours: Temp:  [97.8 F (36.6 C)-99.3 F (37.4 C)] 98 F (36.7 C) (05/25 0758) Pulse Rate:  [93-126] 93 (05/25 0534) Resp:  [15-25] 24 (05/25 0534) BP: (139-154)/(83-120) 139/83 (05/25 0534) SpO2:  [96 %-99 %] 98 % (05/25 0534) Last BM Date: (PTA)  Intake/Output from previous day: 05/24 0701 - 05/25 0700 In: 712.3 [I.V.:712.3] Out: 1850 [Urine:1850] Intake/Output this shift: No intake/output data recorded.  Alert Lungs CTA CV irreg irreg Abd soft, wound clean with wet to dry Calves soft  Lab Results:  Recent Labs    07/28/18 0322 07/29/18 0403  WBC 9.1 10.5  HGB 9.2* 9.7*  HCT 28.6* 29.8*  PLT 199 233   BMET Recent Labs    07/28/18 0322 07/29/18 0403  NA 139 135  K 4.1 3.5  CL 111 102  CO2 23 25  GLUCOSE 138* 121*  BUN 19 14  CREATININE 0.39* 0.32*  CALCIUM 8.0* 7.7*   PT/INR No results for input(s): LABPROT, INR in the last 72 hours. ABG No results for input(s): PHART, HCO3 in the last 72 hours.  Invalid input(s): PCO2, PO2  Studies/Results: Dg Chest Port 1 View  Result Date: 07/28/2018 CLINICAL DATA:  Acute respiratory failure EXAM: PORTABLE CHEST 1 VIEW COMPARISON:  07/26/2018 FINDINGS: Endotracheal tube and NG tube removed. Right arm PICC tip SVC unchanged. Right lower lobe airspace disease unchanged. Progression of left lower lobe airspace disease and small left effusion. Negative for edema. IMPRESSION: Endotracheal tube removed. Progression of left lower lobe airspace disease and small left effusion. Electronically Signed   By: Franchot Gallo M.D.   On: 07/28/2018 09:21    Anti-infectives: Anti-infectives (From admission, onward)   Start     Dose/Rate Route Frequency Ordered Stop   07/25/18 1000  ceFAZolin (ANCEF) IVPB 2g/100 mL premix     2 g 200 mL/hr over 30 Minutes Intravenous To Short Stay 07/25/18  0749 07/25/18 1200   07/25/18 0745  ceFAZolin (ANCEF) IVPB 2g/100 mL premix  Status:  Discontinued     2 g 200 mL/hr over 30 Minutes Intravenous  Once 07/25/18 0735 07/25/18 0749   07/23/18 1000  cefoTEtan (CEFOTAN) 1 g in sodium chloride 0.9 % 100 mL IVPB     1 g 200 mL/hr over 30 Minutes Intravenous  Once 07/23/18 0825 07/23/18 1117      Assessment/Plan: POD 6/4 elap, sbr, open abdomen followed by closure/ileocecectomy/ileocolonic anastomosis-[place] -sips from the floor -Await bowel function -TNA -dressing changes Afib -Cardiology following -hold anticoagulation -proph lovenox only for now  LOS: 7 days    Rachel Park 07/29/2018

## 2018-07-29 NOTE — Progress Notes (Signed)
Patient ID: Rachel Park, female   DOB: [birth year], 85 y.o.   MRN: [id number] Doing well, no bowel function yet, not unexpected, working with pt today

## 2018-07-29 NOTE — Consult Note (Signed)
Physical Medicine and Rehabilitation Consult\  Reason for Consult: Debility Referring Physician: Dr. Vaughan Browner   HPI: Rachel Park is a 85 y.o. female with history of CAD, PAF, TIA, abdominal hernias, SBO; who was admitted on 07/19/18 with one day history of N/V and moderate to severe abdominal pain.  Work up revealed ischemic bowel with distal partial SBO from internal hernia and lactic acidosis. She was started on bowel rest with NGT for decompression and developed peritoneal signs therefore taken to OR by Dr. Donne Hazel on 5/19 for Exp Lap with lysis of adhesions, small bowel resection and abdomen kept open with placement of wound VAC.  Post op kept intubated and taken back to OR on 5/21 for ileocecectomy with ileocolonic anastomosis and abdominal closure.  She tolerated extubation on 5/22 and tolerating clears. She developed A fib with RVR 5/23 treated with IV BB. She has had increase in abdominal pain with ileus and to start TPN for nutritional support. She has had issues with delirium as well as SOB today due to fluid overload.  Therapy ongoing and patient limited by pain, fatigue as well as cognitive deficits with difficulty follow ing commands.  CIR recommended due to functional deficits.    Review of Systems  Constitutional: Negative for chills and fever.  HENT: Negative for hearing loss and tinnitus.   Eyes: Negative for blurred vision.  Respiratory: Positive for shortness of breath. Negative for cough and wheezing.   Cardiovascular: Negative for chest pain and palpitations.  Gastrointestinal: Positive for abdominal pain. Negative for nausea and vomiting.  Musculoskeletal: Positive for back pain and myalgias.  Skin: Negative for itching and rash.  Neurological: Positive for weakness. Negative for dizziness, sensory change and headaches.  Psychiatric/Behavioral: Positive for hallucinations. Negative for memory loss. The patient does not have insomnia.       Past Medical History:   Diagnosis Date  . Arthritis   . Complication of anesthesia   . Coronary artery disease   . Hypertension   . Hypothyroidism   . PAF (paroxysmal atrial fibrillation) ([place])    on Xarelto for maybe a year, opted to stop  . PONV (postoperative nausea and vomiting)   . Renal infarction ([place])   . Small bowel obstruction ([place]) 06/2015  . Stroke [facility])    oct 2016  . TIA (transient ischemic attack) 03/24/2015  . Vertigo, benign positional    to be evaluated, intermittent    Past Surgical History:  Procedure Laterality Date  . ABDOMINAL HYSTERECTOMY    . APPLICATION OF WOUND VAC N/A 07/23/2018   Procedure: APPLICATION OF WOUND VAC;  Surgeon: Rolm Bookbinder, MD;  Location: [facility];  Service: General;  Laterality: N/A;  . BOWEL RESECTION N/A 07/23/2018   Procedure: SMALL BOWEL RESECTION;  Surgeon: Rolm Bookbinder, MD;  Location: [facility];  Service: General;  Laterality: N/A;  . CARDIAC CATHETERIZATION N/A 12/24/2014   Procedure: Left Heart Cath and Coronary Angiography;  Surgeon: Sherren Mocha, MD;  Location: [facility] CV LAB;  Service: Cardiovascular;  Laterality: N/A;  . CARDIOVERSION N/A 05/22/2018   Procedure: CARDIOVERSION;  Surgeon: Josue Hector, MD;  Location: [facility] ENDOSCOPY;  Service: Cardiovascular;  Laterality: N/A;  . CATARACT EXTRACTION Bilateral   . COLONOSCOPY WITH PROPOFOL N/A 12/01/2013   Procedure: COLONOSCOPY WITH PROPOFOL;  Surgeon: Garlan Fair, MD;  Location: [facility] ENDOSCOPY;  Service: Endoscopy;  Laterality: N/A;  . COMPLEX WOUND CLOSURE  07/25/2018   Procedure: Complex Wound Closure;  Surgeon: Rolm Bookbinder, MD;  Location: [facility];  Service: General;;  . Pennie Rushing  07/25/2018   Procedure: Ileocecetomy;  Surgeon: Rolm Bookbinder, MD;  Location: [facility];  Service: General;;  . INCISIONAL HERNIA REPAIR N/A 03/10/2016   Procedure: INCISIONAL HERNIA REPAIR TIMES TWO;  Surgeon: Armandina Gemma, MD;  Location: [place];  Service: General;  Laterality: N/A;  . INGUINAL HERNIA  REPAIR Bilateral 03/10/2016   Procedure: BILATERAL INGUINAL HERNIA REPAIRS;  Surgeon: Armandina Gemma, MD;  Location: [place];  Service: General;  Laterality: Bilateral;  . INSERTION OF MESH N/A 03/10/2016   Procedure: INSERTION OF MESH TO BILATERAL GROINS AND ABDOMEN;  Surgeon: Armandina Gemma, MD;  Location: [facility];  Service: General;  Laterality: N/A;  . laparotomy  06/2015  . LAPAROTOMY N/A 07/23/2018   Procedure: EXPLORATORY LAPAROTOMY;  Surgeon: Rolm Bookbinder, MD;  Location: [facility];  Service: General;  Laterality: N/A;  . LEFT HEART CATH AND CORONARY ANGIOGRAPHY N/A 09/27/2016   Procedure: Left Heart Cath and Coronary Angiography;  Surgeon: Sherren Mocha, MD;  Location: [facility] CV LAB;  Service: Cardiovascular;  Laterality: N/A;  . TONSILLECTOMY      Family History  Problem Relation Age of Onset  . Heart attack Father 3       Died age 24  . Stroke Mother   . Cancer Sister        breast    Social History:  Lives alone. Independent and active walks 45 minutes daily. She reports that she has never smoked. She has never used smokeless tobacco. She reports previous alcohol use. She reports that she does not use drugs.    Allergies  Allergen Reactions  . Anesthesia S-I-60 Nausea And Vomiting    "with hysterectomy years back had post op N/V"    Medications Prior to Admission  Medication Sig Dispense Refill  . acetaminophen (TYLENOL) 500 MG tablet Take 500 mg by mouth every 6 (six) hours as needed for mild pain.     Marland Kitchen amiodarone (PACERONE) 200 MG tablet Take 1 tablet (200 mg total) twice daily for one month.  Then reduce and take 1 tablet daily 180 tablet 2  . apixaban (ELIQUIS) 5 MG TABS tablet Take 1 tablet (5 mg total) by mouth 2 (two) times daily. 60 tablet 3  . CALCIUM PO Take 1 tablet by mouth daily.    . cholecalciferol (VITAMIN D) 1000 UNITS tablet Take 2,000 Units by mouth daily with lunch.     . diltiazem (CARDIZEM CD) 180 MG 24 hr capsule Take 1 capsule (180 mg total) by mouth  daily. 30 capsule 2  . levothyroxine (SYNTHROID, LEVOTHROID) 75 MCG tablet Take 75 mcg by mouth daily before breakfast.    . metoprolol tartrate (LOPRESSOR) 25 MG tablet Take 1 tablet (25 mg total) by mouth 2 (two) times daily. 180 tablet 2  . NITROSTAT 0.4 MG SL tablet Place 1 tablet (0.4 mg total) under the tongue every 5 (five) minutes as needed. Chest pain 25 tablet 6  . Polyethyl Glycol-Propyl Glycol (SYSTANE ULTRA) 0.4-0.3 % SOLN Place 1-2 drops into both eyes 3 (three) times daily as needed (dry eyes).    . raNITIdine HCl (ZANTAC PO) Take 1 tablet by mouth daily as needed (heartbrun).    . rosuvastatin (CRESTOR) 10 MG tablet Take 1 tablet (10 mg total) by mouth daily. 90 tablet 2    Home: Home Living Family/patient expects to be discharged to:: Private residence Living Arrangements: Alone Available Help at Discharge: Family, Available PRN/intermittently Type of Home: House  Home Access: Level entry Home Layout: One level Bathroom Shower/Tub: Multimedia programmer: Standard Home Equipment: None  Functional History: Prior Function Level of Independence: Independent Functional Status:  Mobility: Bed Mobility Overal bed mobility: Needs Assistance Bed Mobility: Supine to Sit, Sit to Supine Supine to sit: Mod assist Sit to supine: Mod assist General bed mobility comments: ModA for trunk elevation to sit edge of bed. ModA for BLE negotiation back into bed Transfers Overall transfer level: Needs assistance Equipment used: Rolling walker (2 wheeled) Transfers: Sit to/from Stand Sit to Stand: +2 physical assistance, Mod assist General transfer comment: ModA for lifting  Ambulation/Gait Ambulation/Gait assistance: Min assist Gait Distance (Feet): 6 Feet Assistive device: Rolling walker (2 wheeled) Gait Pattern/deviations: Step-through pattern, Decreased step length - right, Decreased step length - left, Trunk flexed, Narrow base of support, Antalgic, Shuffle General Gait  Details: Pt requiring min assist for walker negotiation with turns and obstacles, frequently running into objects. very slow speed, tending to step in place rather than progress forwards. max cues provided for direction, sequencing, and increasing step cadence, pt unable to correct Gait velocity: slowed Gait velocity interpretation: <1.31 ft/sec, indicative of household ambulator    ADL:    Cognition: Cognition Overall Cognitive Status: Impaired/Different from baseline Orientation Level: Oriented X4 Cognition Arousal/Alertness: Awake/alert Behavior During Therapy: Flat affect Overall Cognitive Status: Impaired/Different from baseline Area of Impairment: Attention, Awareness, Orientation, Following commands, Problem solving Orientation Level: Disoriented to, Place, Time, Situation Current Attention Level: Focused Following Commands: Follows one step commands with increased time, Follows one step commands inconsistently Awareness: Intellectual Problem Solving: Slow processing, Requires verbal cues General Comments: Pt hallucinating, stating there was a cat and a man in the room and that she was on a farm. RN notified; pt received dilaudid prior to treatment Difficult to assess due to: Intubated   Blood pressure 139/83, pulse 93, temperature 98 F (36.7 C), temperature source Oral, resp. rate (!) 24, height 5' 2.5" (1.588 m), weight 73.7 kg, SpO2 98 %. Physical Exam  Nursing note and vitals reviewed. Constitutional: She is oriented to person, place, and time. She appears well-developed and well-nourished. No distress.  Fatigued appearing.   HENT:  Head: Normocephalic and atraumatic.  Eyes: Pupils are equal, round, and reactive to light.  Neck: Normal range of motion.  Cardiovascular: An irregularly irregular rhythm present.  tachycardic  Respiratory:  Increased WOB with conversation.  GI: Soft. She exhibits no distension. Bowel sounds are decreased. There is abdominal tenderness.   Abdominal wound with dry dressing with dry serous drainage.   Genitourinary:    Genitourinary Comments: foley   Musculoskeletal:        General: No edema.  Neurological: She is alert and oriented to person, place, and time.  UE 4/5 with pain inhibition. LE: 3/5 HF, KE and 4/5 distally. No focal sensory changes  Skin: She is not diaphoretic.  Abdominal wounds dressed  Psychiatric:  Pleasant, a little anxious. Sometime lost concentration during conversation    Results for orders placed or performed during the hospital encounter of 07/22/18 (from the past 24 hour(s))  Glucose, capillary     Status: Abnormal   Collection Time: 07/28/18 12:12 PM  Result Value Ref Range   Glucose-Capillary 130 (H) 70 - 99 mg/dL  Glucose, capillary     Status: Abnormal   Collection Time: 07/28/18  5:24 PM  Result Value Ref Range   Glucose-Capillary 129 (H) 70 - 99 mg/dL  Glucose, capillary     Status:  Abnormal   Collection Time: 07/28/18  8:17 PM  Result Value Ref Range   Glucose-Capillary 121 (H) 70 - 99 mg/dL  Glucose, capillary     Status: Abnormal   Collection Time: 07/29/18 12:31 AM  Result Value Ref Range   Glucose-Capillary 139 (H) 70 - 99 mg/dL  Comprehensive metabolic panel     Status: Abnormal   Collection Time: 07/29/18  4:03 AM  Result Value Ref Range   Sodium 135 135 - 145 mmol/L   Potassium 3.5 3.5 - 5.1 mmol/L   Chloride 102 98 - 111 mmol/L   CO2 25 22 - 32 mmol/L   Glucose, Bld 121 (H) 70 - 99 mg/dL   BUN 14 8 - 23 mg/dL   Creatinine, Ser 0.32 (L) 0.44 - 1.00 mg/dL   Calcium 7.7 (L) 8.9 - 10.3 mg/dL   Total Protein 4.5 (L) 6.5 - 8.1 g/dL   Albumin 1.9 (L) 3.5 - 5.0 g/dL   AST 13 (L) 15 - 41 U/L   ALT 20 0 - 44 U/L   Alkaline Phosphatase 50 38 - 126 U/L   Total Bilirubin 0.8 0.3 - 1.2 mg/dL   GFR calc non Af Amer >60 >60 mL/min   GFR calc Af Amer >60 >60 mL/min   Anion gap 8 5 - 15  Magnesium     Status: None   Collection Time: 07/29/18  4:03 AM  Result Value Ref Range    Magnesium 1.7 1.7 - 2.4 mg/dL  Phosphorus     Status: None   Collection Time: 07/29/18  4:03 AM  Result Value Ref Range   Phosphorus 2.5 2.5 - 4.6 mg/dL  CBC     Status: Abnormal   Collection Time: 07/29/18  4:03 AM  Result Value Ref Range   WBC 10.5 4.0 - 10.5 K/uL   RBC 3.20 (L) 3.87 - 5.11 MIL/uL   Hemoglobin 9.7 (L) 12.0 - 15.0 g/dL   HCT 29.8 (L) 36.0 - 46.0 %   MCV 93.1 80.0 - 100.0 fL   MCH 30.3 26.0 - 34.0 pg   MCHC 32.6 30.0 - 36.0 g/dL   RDW 15.5 11.5 - 15.5 %   Platelets 233 150 - 400 K/uL   nRBC 0.0 0.0 - 0.2 %  Differential     Status: Abnormal   Collection Time: 07/29/18  4:03 AM  Result Value Ref Range   Neutrophils Relative % 79 %   Neutro Abs 8.3 (H) 1.7 - 7.7 K/uL   Lymphocytes Relative 8 %   Lymphs Abs 0.8 0.7 - 4.0 K/uL   Monocytes Relative 9 %   Monocytes Absolute 1.0 0.1 - 1.0 K/uL   Eosinophils Relative 2 %   Eosinophils Absolute 0.2 0.0 - 0.5 K/uL   Basophils Relative 0 %   Basophils Absolute 0.0 0.0 - 0.1 K/uL   Immature Granulocytes 2 %   Abs Immature Granulocytes 0.17 (H) 0.00 - 0.07 K/uL  Triglycerides     Status: None   Collection Time: 07/29/18  4:03 AM  Result Value Ref Range   Triglycerides 103 <150 mg/dL  Prealbumin     Status: Abnormal   Collection Time: 07/29/18  4:03 AM  Result Value Ref Range   Prealbumin 6.8 (L) 18 - 38 mg/dL  Glucose, capillary     Status: Abnormal   Collection Time: 07/29/18  4:12 AM  Result Value Ref Range   Glucose-Capillary 117 (H) 70 - 99 mg/dL  Glucose, capillary     Status:  Abnormal   Collection Time: 07/29/18  7:55 AM  Result Value Ref Range   Glucose-Capillary 115 (H) 70 - 99 mg/dL   Dg Chest Port 1 View  Result Date: 07/28/2018 CLINICAL DATA:  Acute respiratory failure EXAM: PORTABLE CHEST 1 VIEW COMPARISON:  07/26/2018 FINDINGS: Endotracheal tube and NG tube removed. Right arm PICC tip SVC unchanged. Right lower lobe airspace disease unchanged. Progression of left lower lobe airspace disease and  small left effusion. Negative for edema. IMPRESSION: Endotracheal tube removed. Progression of left lower lobe airspace disease and small left effusion. Electronically Signed   By: Franchot Gallo M.D.   On: 07/28/2018 09:21     Assessment/Plan: Diagnosis: debility related to SBO, multiple medical issues 1. Does the need for close, 24 hr/day medical supervision in concert with the patient's rehab needs make it unreasonable for this patient to be served in a less intensive setting? Yes 2. Co-Morbidities requiring supervision/potential complications: CAD, PAF,TIA 3. Due to bladder management, bowel management, safety, skin/wound care, disease management, medication administration, pain management and patient education, does the patient require 24 hr/day rehab nursing? Yes 4. Does the patient require coordinated care of a physician, rehab nurse, PT (1-2 hrs/day, 5 days/week) and OT (1-2 hrs/day, 5 days/week) to address physical and functional deficits in the context of the above medical diagnosis(es)? Yes Addressing deficits in the following areas: balance, endurance, locomotion, strength, transferring, bowel/bladder control, bathing, dressing, feeding, grooming, toileting and psychosocial support 5. Can the patient actively participate in an intensive therapy program of at least 3 hrs of therapy per day at least 5 days per week? Yes 6. The potential for patient to make measurable gains while on inpatient rehab is excellent 7. Anticipated functional outcomes upon discharge from inpatient rehab are modified independent and supervision  with PT, modified independent and supervision with OT, n/a with SLP. 8. Estimated rehab length of stay to reach the above functional goals is: 9-13 days 9. Anticipated D/C setting: Home 10. Anticipated post D/C treatments: [place] therapy 11. Overall Rehab/Functional Prognosis: excellent  RECOMMENDATIONS: This patient's condition is appropriate for continued rehabilitative  care in the following setting: CIR Patient has agreed to participate in recommended program. Yes Note that insurance prior authorization may be required for reimbursement for recommended care.  Comment: Rehab Admissions Coordinator to follow up.  Thanks,  Meredith Staggers, MD, Mellody Drown  I have personally performed a face to face diagnostic evaluation of this patient. Additionally, I have examined pertinent labs and radiographic images. I have reviewed and concur with the physician assistant's documentation above.    Bary Leriche, PA-C 07/29/2018

## 2018-07-29 NOTE — Evaluation (Deleted)
Physical Therapy Evaluation Patient Details Name: Rachel Park MRN: [id number] DOB: 09-17-1931 Today's Date: 07/29/2018   History of Present Illness  89 yof with SBO, failed medical management, taken to OR with findings of necrotic bowel s/p resection.  Abd left open with wound vac and returns to ICU on mechanical ventilation. Extubated 07/25/18  Clinical Impression  Pt progressing towards goals and seemed to be more alert this session. Able to ambulate to chair with min A. Required mod A for bed mobility and to stand with RW. HR elevating to mid to upper 130s during short distance ambulation. Pt slightly confused and asked if I could take her to her room. Educated that she was in her room. Feel current recommendations appropriate. Will continue to follow acutely to maximize functional mobility independence and safety.     Follow Up Recommendations CIR    Equipment Recommendations  Rolling walker with 5" wheels    Recommendations for Other Services       Precautions / Restrictions Precautions Precautions: Fall Restrictions Weight Bearing Restrictions: No      Mobility  Bed Mobility Overal bed mobility: Needs Assistance Bed Mobility: Supine to Sit     Supine to sit: Mod assist     General bed mobility comments: Mod A for trunk elevation. Increased time required to perform bed mobility tasks.   Transfers Overall transfer level: Needs assistance Equipment used: Rolling walker (2 wheeled) Transfers: Sit to/from Stand Sit to Stand: Mod assist         General transfer comment: Mod A for lift assist and steadying. Cues for safe hand placement.   Ambulation/Gait Ambulation/Gait assistance: Min assist Gait Distance (Feet): 5 Feet Assistive device: Rolling walker (2 wheeled) Gait Pattern/deviations: Step-through pattern;Decreased step length - right;Decreased step length - left;Trunk flexed;Narrow base of support;Antalgic;Shuffle Gait velocity: slowed   General Gait  Details: Pt with improved use of RW this session and able to ambulate to the chair. Pt fatiguing with HR elevating to mid to upper 130s, so further mobility limited.   Stairs            Wheelchair Mobility    Modified Rankin (Stroke Patients Only)       Balance Overall balance assessment: Needs assistance Sitting-balance support: No upper extremity supported;Feet supported Sitting balance-Leahy Scale: Fair     Standing balance support: Bilateral upper extremity supported Standing balance-Leahy Scale: Poor Standing balance comment: reliant on UE and external assist                             Pertinent Vitals/Pain Pain Assessment: Faces Faces Pain Scale: Hurts little more Pain Location: abdomen Pain Descriptors / Indicators: Aching;Sore;Grimacing Pain Intervention(s): Limited activity within patient's tolerance;Monitored during session;Repositioned    Home Living                        Prior Function                 Hand Dominance        Extremity/Trunk Assessment                Communication      Cognition Arousal/Alertness: Awake/alert Behavior During Therapy: WFL for tasks assessed/performed Overall Cognitive Status: Impaired/Different from baseline Area of Impairment: Attention;Awareness;Following commands;Problem solving;Memory                   Current Attention Level: Sustained Memory: Decreased short-term  memory Following Commands: Follows one step commands with increased time;Follows one step commands inconsistently   Awareness: Emergent Problem Solving: Slow processing;Requires verbal cues General Comments: Pt very concerned that she was not in her room upon entry. Explained that she was in her room at this time. Pt reports "I feel so dumb."      General Comments      Exercises General Exercises - Lower Extremity Ankle Circles/Pumps: AROM;Both;10 reps;Seated   Assessment/Plan    PT Assessment     PT Problem List         PT Treatment Interventions      PT Goals (Current goals can be found in the Care Plan section)  Acute Rehab PT Goals Patient Stated Goal: to get up and walk  PT Goal Formulation: With patient Time For Goal Achievement: 08/08/18 Potential to Achieve Goals: Good    Frequency Min 3X/week   Barriers to discharge        Co-evaluation               AM-PAC PT "6 Clicks" Mobility  Outcome Measure Help needed turning from your back to your side while in a flat bed without using bedrails?: A Lot Help needed moving from lying on your back to sitting on the side of a flat bed without using bedrails?: A Lot Help needed moving to and from a bed to a chair (including a wheelchair)?: A Little Help needed standing up from a chair using your arms (e.g., wheelchair or bedside chair)?: A Lot Help needed to walk in hospital room?: A Little Help needed climbing 3-5 steps with a railing? : Total 6 Click Score: 13    End of Session Equipment Utilized During Treatment: Gait belt;Oxygen Activity Tolerance: Patient tolerated treatment well Patient left: in chair;with call bell/phone within reach;with chair alarm set Nurse Communication: Mobility status PT Visit Diagnosis: Muscle weakness (generalized) (M62.81)    Time: 3825-0539 PT Time Calculation (min) (ACUTE ONLY): 24 min   Charges:     PT Treatments $Therapeutic Activity: 23-37 mins        Leighton Ruff, PT, DPT  Acute Rehabilitation Services  Pager: [phone] Office: [phone]   Rudean Hitt 07/29/2018, 1:46 PM

## 2018-07-29 NOTE — Progress Notes (Signed)
Physical Therapy Treatment Patient Details Name: Rachel Park MRN: [id number] DOB: [birth year] Today's Date: 07/29/2018    History of Present Illness 28 yof with SBO, failed medical management, taken to OR with findings of necrotic bowel s/p resection.  Abd left open with wound vac and returns to ICU on mechanical ventilation. Extubated 07/25/18    PT Comments    Pt progressing towards goals and seemed to be more alert this session. Able to ambulate to chair with min A. Required mod A for bed mobility and to stand with RW. HR elevating to mid to upper 130s during short distance ambulation. Pt slightly confused and asked if I could take her to her room. Educated that she was in her room. Feel current recommendations appropriate. Will continue to follow acutely to maximize functional mobility independence and safety.     Follow Up Recommendations  CIR     Equipment Recommendations  Rolling walker with 5" wheels    Recommendations for Other Services       Precautions / Restrictions Precautions Precautions: Fall Restrictions Weight Bearing Restrictions: No    Mobility  Bed Mobility Overal bed mobility: Needs Assistance Bed Mobility: Supine to Sit     Supine to sit: Mod assist     General bed mobility comments: Mod A for trunk elevation. Increased time required to perform bed mobility tasks.   Transfers Overall transfer level: Needs assistance Equipment used: Rolling walker (2 wheeled) Transfers: Sit to/from Stand Sit to Stand: Mod assist         General transfer comment: Mod A for lift assist and steadying. Cues for safe hand placement.   Ambulation/Gait Ambulation/Gait assistance: Min assist Gait Distance (Feet): 5 Feet Assistive device: Rolling walker (2 wheeled) Gait Pattern/deviations: Step-through pattern;Decreased step length - right;Decreased step length - left;Trunk flexed;Narrow base of support;Antalgic;Shuffle Gait velocity: slowed   General Gait  Details: Pt with improved use of RW this session and able to ambulate to the chair. Pt fatiguing with HR elevating to mid to upper 130s, so further mobility limited.    Stairs             Wheelchair Mobility    Modified Rankin (Stroke Patients Only)       Balance Overall balance assessment: Needs assistance Sitting-balance support: No upper extremity supported;Feet supported Sitting balance-Leahy Scale: Fair     Standing balance support: Bilateral upper extremity supported Standing balance-Leahy Scale: Poor Standing balance comment: reliant on UE and external assist                            Cognition Arousal/Alertness: Awake/alert Behavior During Therapy: WFL for tasks assessed/performed Overall Cognitive Status: Impaired/Different from baseline Area of Impairment: Attention;Awareness;Following commands;Problem solving;Memory                   Current Attention Level: Sustained Memory: Decreased short-term memory Following Commands: Follows one step commands with increased time;Follows one step commands inconsistently   Awareness: Emergent Problem Solving: Slow processing;Requires verbal cues General Comments: Pt very concerned that she was not in her room upon entry. Explained that she was in her room at this time. Pt reports "I feel so dumb."      Exercises General Exercises - Lower Extremity Ankle Circles/Pumps: AROM;Both;10 reps;Seated    General Comments        Pertinent Vitals/Pain Pain Assessment: Faces Faces Pain Scale: Hurts little more Pain Location: abdomen Pain Descriptors / Indicators: Aching;Sore;Grimacing Pain  Intervention(s): Limited activity within patient's tolerance;Monitored during session;Repositioned    Home Living                      Prior Function            PT Goals (current goals can now be found in the care plan section) Acute Rehab PT Goals Patient Stated Goal: to get up and walk  PT Goal  Formulation: With patient Time For Goal Achievement: 08/08/18 Potential to Achieve Goals: Good Progress towards PT goals: Progressing toward goals    Frequency    Min 3X/week      PT Plan Current plan remains appropriate    Co-evaluation              AM-PAC PT "6 Clicks" Mobility   Outcome Measure  Help needed turning from your back to your side while in a flat bed without using bedrails?: A Lot Help needed moving from lying on your back to sitting on the side of a flat bed without using bedrails?: A Lot Help needed moving to and from a bed to a chair (including a wheelchair)?: A Little Help needed standing up from a chair using your arms (e.g., wheelchair or bedside chair)?: A Lot Help needed to walk in hospital room?: A Little Help needed climbing 3-5 steps with a railing? : Total 6 Click Score: 13    End of Session Equipment Utilized During Treatment: Gait belt;Oxygen Activity Tolerance: Patient tolerated treatment well Patient left: in chair;with call bell/phone within reach;with chair alarm set Nurse Communication: Mobility status PT Visit Diagnosis: Muscle weakness (generalized) (M62.81)     Time: 3151-7616 PT Time Calculation (min) (ACUTE ONLY): 24 min  Charges:  $Therapeutic Activity: 23-37 mins                     Leighton Ruff, PT, DPT  Acute Rehabilitation Services  Pager: [phone] Office: [phone]    Rudean Hitt 07/29/2018, 1:50 PM

## 2018-07-29 NOTE — Progress Notes (Addendum)
Family Medicine Teaching Service Daily Progress Note Intern Pager: [phone]  Patient name: Rachel Park Medical record number: [id number] Date of birth: [birth year] Age: 85 y.o. Gender: female  Primary Care Provider: Lajean Manes, MD Consultants: CCM, gen surg, cardiology Code Status: full  Pt Overview and Major Events to Date:  5/18 admitted to [facility] 5/19 ex lap, underwent colonic resection 5/21 underwent ileocolic anastomosis 7/61 extubated 5/24 out to floor, care resumed by FPTS  Assessment and Plan: 84 year old female who presented with severe abdominal pain.  Found to have ischemic bowel on ex lap, bowel resected and underwent reanastomosis.  Has developed ileus postoperatively, extubated on 5/22 and transferred out of floor on 5/24.  Post-operative Ileus, resolved Status post ileocolic anastomosis. Patient has had two BM, last one this morning. Received 3 doses dilaudid yesterday.   Receiving TPN.   General surgery following appreciate their recommendations. Will get PT to work with patient.   Continuing IV medications, will transition to p.o. when able. -Vital signs per floor routine -Continue to ambulate with PT/OT, can receive as needed pain medication prior to ambulating -TPN, pharmacy to dose, 70 mL/h at current time -Zofran PRN - oxy 2.5mg  q4h PRN - tylenol q6h  Status post ileocolic anastomosis secondary to ischemic bowel General surgery following appreciate their recommendations.  Still awaiting return to bowel function, discussed above.  Continue TPA, discussed above.  We will continue to hold Eliquis, receiving prophylactic Lovenox.  Ambulate as tolerated.  - Daily CBC, BMP - surgery following, appreciate recommendations - pt to go to CIR  History of A. Fib  Cardiology following appreciate the recommendations.  Per their note can increase scheduled Lopressor slightly.  Also has 2.5mg  every 6 hours PRN ordered.  HR in 90s today.   Continue to monitor  electrolytes, especially in setting of TPN.hasn't received her PRN metoprolol today.  - metop 5mg  IV q6h, w/ 2.5mg  PRN q4h.  -Restart DOAC when okay with surgery -Continue Lovenox for DVT prophylaxis until DOAC started.  -Daily BMP  Small left pleural effusion Unclear etiology.  Likely secondary to atelectasis.  No progression from last x-ray.  We will continue to monitor for fever or leukocytosis.   - incentive spirometry  Hypothyroidism -  Pt takes 86mcg at home daily.  - transitioned from IV to PO synthroid today  Hypophosphatemia Phosphorus 2.5 today.    Hypocalcemia Calcium 7.9>8.0>7.7>7.9  When corrected for low albumin calcium is in the normal range.  Expect albumin to improve once able to tolerate better p.o.  - Monitor with daily calcium  Normocytic anemia Hemoglobin 9.7 from 9.6, normocytic.  Likely secondary to blood loss during surgery.  We will continue to follow daily CBC.   FEN/GI: N.p.o., sips with meds and ice chips allowed. PPx: Pantoprazole 40 mg, Lovenox  Disposition: likely CIR  Subjective:  Patient states she is having bowel movements.  Did not notice any blood.  She was able to walk to the bathroom using a walker without getting SOB.  She is still having some abdominal pain along the incision but had some additional discomfort with cough.  She says yesterday she got confused and thought she was shopping at the mall.  She is aware of her location today.    Objective: Temp:  [97.8 F (36.6 C)-99.3 F (37.4 C)] 97.8 F (36.6 C) (05/25 2100) Pulse Rate:  [84-117] 103 (05/25 2100) Resp:  [20-29] 29 (05/25 2100) BP: (111-145)/(74-93) 115/74 (05/25 2100) SpO2:  [94 %-98 %] 96 % (05/25 1757)  Physical Exam: General: alert, oriented.  No acute distress Cardiovascular: irregularly irregular rhythm.  No murmurs. No pedal edema.   Respiratory: LCTAB.  No wheezes or crackles.  Abdomen: TTP to light touch along the site of incision.     Laboratory: Recent  Labs  Lab 07/27/18 0336 07/28/18 0322 07/29/18 0403  WBC 8.8 9.1 10.5  HGB 9.6* 9.2* 9.7*  HCT 29.7* 28.6* 29.8*  PLT 172 199 233   Recent Labs  Lab 07/27/18 0336 07/28/18 0322 07/29/18 0403  NA 139 139 135  K 3.7 4.1 3.5  CL 114* 111 102  CO2 22 23 25   BUN 18 19 14   CREATININE 0.43* 0.39* 0.32*  CALCIUM 7.9* 8.0* 7.7*  PROT 4.4*  --  4.5*  BILITOT 1.0  --  0.8  ALKPHOS 44  --  50  ALT 24  --  20  AST 21  --  13*  GLUCOSE 155* 138* 121*      Benay Pike, MD 07/29/2018, 10:03 PM PGY-1, [place] Intern pager: [phone], text pages welcome

## 2018-07-30 DIAGNOSIS — I4819 Other persistent atrial fibrillation: Secondary | ICD-10-CM

## 2018-07-30 LAB — BASIC METABOLIC PANEL
Anion gap: 5 (ref 5–15)
BUN: 21 mg/dL (ref 8–23)
CO2: 28 mmol/L (ref 22–32)
Calcium: 7.9 mg/dL — ABNORMAL LOW (ref 8.9–10.3)
Chloride: 102 mmol/L (ref 98–111)
Creatinine, Ser: 0.34 mg/dL — ABNORMAL LOW (ref 0.44–1.00)
GFR calc Af Amer: >60 mL/min (ref >60)
GFR calc non Af Amer: >60 mL/min (ref >60)
Glucose, Bld: 124 mg/dL — ABNORMAL HIGH (ref 70–99)
Potassium: 3.7 mmol/L (ref 3.5–5.1)
Sodium: 135 mmol/L (ref 135–145)

## 2018-07-30 LAB — GLUCOSE, CAPILLARY
Glucose-Capillary: 103 mg/dL — ABNORMAL HIGH (ref 70–99)
Glucose-Capillary: 116 mg/dL — ABNORMAL HIGH (ref 70–99)
Glucose-Capillary: 124 mg/dL — ABNORMAL HIGH (ref 70–99)
Glucose-Capillary: 128 mg/dL — ABNORMAL HIGH (ref 70–99)
Glucose-Capillary: 134 mg/dL — ABNORMAL HIGH (ref 70–99)
Glucose-Capillary: 140 mg/dL — ABNORMAL HIGH (ref 70–99)

## 2018-07-30 LAB — MAGNESIUM: Magnesium: 1.9 mg/dL (ref 1.7–2.4)

## 2018-07-30 MED ORDER — INSULIN ASPART 100 UNIT/ML ~~LOC~~ SOLN
0.0000 [IU] | Freq: Four times a day (QID) | SUBCUTANEOUS | Status: DC
  Administered 2018-07-30 – 2018-08-04 (×4): 1 [IU] via SUBCUTANEOUS

## 2018-07-30 MED ORDER — MAGNESIUM SULFATE 2 GM/50ML IV SOLN
2.0000 g | Freq: Once | INTRAVENOUS | Status: AC
  Administered 2018-07-30: 2 g via INTRAVENOUS
  Filled 2018-07-30: qty 50

## 2018-07-30 MED ORDER — ENSURE ENLIVE PO LIQD
237.0000 mL | Freq: Two times a day (BID) | ORAL | Status: DC
  Administered 2018-07-30: 13:00:00 237 mL via ORAL
  Filled 2018-07-30: qty 237

## 2018-07-30 MED ORDER — TRAVASOL 10 % IV SOLN
INTRAVENOUS | Status: AC
  Administered 2018-07-30: 18:00:00 via INTRAVENOUS
  Filled 2018-07-30: qty 991.2

## 2018-07-30 MED ORDER — OXYCODONE HCL 5 MG PO TABS
2.5000 mg | ORAL_TABLET | ORAL | Status: DC | PRN
  Administered 2018-07-30 – 2018-08-01 (×6): 2.5 mg via ORAL
  Filled 2018-07-30 (×8): qty 1

## 2018-07-30 MED ORDER — POTASSIUM CHLORIDE 10 MEQ/50ML IV SOLN
10.0000 meq | INTRAVENOUS | Status: AC
  Administered 2018-07-30 (×3): 10 meq via INTRAVENOUS
  Filled 2018-07-30 (×3): qty 50

## 2018-07-30 NOTE — Progress Notes (Signed)
  Progress Note: General Surgery Service   Assessment/Plan: Active Problems:   Small bowel obstruction (HCC)   SBO (small bowel obstruction) (HCC)  s/p Procedure(s): Ileocecetomy Complex Wound Closure 07/25/2018  POD 7/5 ex lap, SBR, open abdomen followed by closure/ileocecectomy/ileocolonic anastomosis -full liquids, ensure -TPN -continue dressing changes  Atrial fibrillation -cardiology following -hold anticoagulation -lovenox prophylactic dose   LOS: 8 days  Chief Complaint/Subjective: Much better night, 2 bowel movements, no appetite, pain moderate  Objective: Vital signs in last 24 hours: Temp:  [97.8 F (36.6 C)-98.9 F (37.2 C)] 98 F (36.7 C) (05/26 0534) Pulse Rate:  [84-117] 98 (05/26 0534) Resp:  [20-29] 24 (05/26 0534) BP: (111-145)/(74-93) 120/89 (05/26 0534) SpO2:  [94 %-97 %] 96 % (05/25 1757) Last BM Date: 07/29/18  Intake/Output from previous day: 05/25 0701 - 05/26 0700 In: -  Out: 1725 [Urine:1725] Intake/Output this shift: No intake/output data recorded.  Lungs: nonlabored  Cardiovascular: irreg irreg  Abd: soft, ATTP, open wound with dark base  Extremities: no edema  Neuro: AOx4  Lab Results: CBC  Recent Labs    07/28/18 0322 07/29/18 0403  WBC 9.1 10.5  HGB 9.2* 9.7*  HCT 28.6* 29.8*  PLT 199 233   BMET Recent Labs    07/29/18 0403 07/30/18 0302  NA 135 135  K 3.5 3.7  CL 102 102  CO2 25 28  GLUCOSE 121* 124*  BUN 14 21  CREATININE 0.32* 0.34*  CALCIUM 7.7* 7.9*   PT/INR No results for input(s): LABPROT, INR in the last 72 hours. ABG No results for input(s): PHART, HCO3 in the last 72 hours.  Invalid input(s): PCO2, PO2  Studies/Results:  Anti-infectives: Anti-infectives (From admission, onward)   Start     Dose/Rate Route Frequency Ordered Stop   07/25/18 1000  ceFAZolin (ANCEF) IVPB 2g/100 mL premix     2 g 200 mL/hr over 30 Minutes Intravenous To Short Stay 07/25/18 0749 07/25/18 1200   07/25/18  0745  ceFAZolin (ANCEF) IVPB 2g/100 mL premix  Status:  Discontinued     2 g 200 mL/hr over 30 Minutes Intravenous  Once 07/25/18 0735 07/25/18 0749   07/23/18 1000  cefoTEtan (CEFOTAN) 1 g in sodium chloride 0.9 % 100 mL IVPB     1 g 200 mL/hr over 30 Minutes Intravenous  Once 07/23/18 0825 07/23/18 1117      Medications: Scheduled Meds: . acetaminophen  650 mg Oral Q6H  . chewing gum (ORBIT) sugar free  1 Stick Oral QID  . chlorhexidine  15 mL Mouth Rinse BID  . Chlorhexidine Gluconate Cloth  6 each Topical Daily  . enoxaparin (LOVENOX) injection  40 mg Subcutaneous Q24H  . insulin aspart  0-9 Units Subcutaneous Q4H  . levothyroxine  75 mcg Oral QAC breakfast  . mouth rinse  15 mL Mouth Rinse q12n4p  . metoprolol tartrate  5 mg Intravenous 6 X Daily  . pantoprazole  40 mg Oral Daily  . sodium chloride flush  10-40 mL Intracatheter Q12H   Continuous Infusions: . magnesium sulfate bolus IVPB 2 g (07/30/18 0815)  . potassium chloride    . TPN ADULT (ION) 70 mL/hr at 07/29/18 1737   PRN Meds:.HYDROmorphone (DILAUDID) injection, metoprolol tartrate, ondansetron **OR** ondansetron (ZOFRAN) IV, sodium chloride flush  Mickeal Skinner, MD [facility]

## 2018-07-30 NOTE — Progress Notes (Signed)
Physical Therapy Treatment Patient Details Name: Rachel Park MRN: [id number] DOB: 1932/01/20 Today's Date: 07/30/2018    History of Present Illness 23 yof with SBO, failed medical management, taken to OR with findings of necrotic bowel s/p resection.  Abd left open with wound vac and returns to ICU on mechanical ventilation. Extubated 07/25/18    PT Comments    Pt performed gait training and functional mobility with decreased assistance.  She continues to require mod assistance for trunk elevation from sidelying position but required min guard to stand and min assistance to ambulate.  She increased gait distance but required min assistance due to poor safety and need for min assistance.  Continue to recommend aggressive CIR therapies to return patient's function to baseline.  Pt is a promising candidate.      Follow Up Recommendations  CIR     Equipment Recommendations  Rolling walker with 5" wheels    Recommendations for Other Services       Precautions / Restrictions Precautions Precautions: Fall Precaution Comments: abdominal incision. heart pillow in room to splint incision Restrictions Weight Bearing Restrictions: No    Mobility  Bed Mobility Overal bed mobility: Needs Assistance Bed Mobility: Supine to Sit     Supine to sit: Mod assist     General bed mobility comments: Mod A for trunk elevation. Increased time required to perform bed mobility tasks.   Transfers Overall transfer level: Needs assistance Equipment used: Rolling walker (2 wheeled) Transfers: Sit to/from Stand Sit to Stand: Min guard         General transfer comment: Cues for hand placement to and from seated surface.    Ambulation/Gait Ambulation/Gait assistance: Min assist Gait Distance (Feet): 140 Feet Assistive device: Rolling walker (2 wheeled) Gait Pattern/deviations: Step-through pattern;Trunk flexed;Narrow base of support;Antalgic;Shuffle Gait velocity: slowed   General Gait  Details: Assistance for upper trunk control, maintaining RW position.  Pt required cues for negotiating obstacles in halls.     Stairs             Wheelchair Mobility    Modified Rankin (Stroke Patients Only)       Balance Overall balance assessment: Needs assistance Sitting-balance support: No upper extremity supported;Feet supported Sitting balance-Leahy Scale: Fair       Standing balance-Leahy Scale: Poor Standing balance comment: reliant on UE and external assist                            Cognition Arousal/Alertness: Awake/alert Behavior During Therapy: WFL for tasks assessed/performed Overall Cognitive Status: Within Functional Limits for tasks assessed                                 General Comments: Pt A/ox4 with appropriate interactions during session.        Exercises      General Comments        Pertinent Vitals/Pain Pain Assessment: Faces Faces Pain Scale: Hurts little more Pain Location: abdomen Pain Descriptors / Indicators: Aching;Sore;Grimacing Pain Intervention(s): Monitored during session;Repositioned    Home Living                      Prior Function            PT Goals (current goals can now be found in the care plan section) Acute Rehab PT Goals Patient Stated Goal: to get up and  walk  Potential to Achieve Goals: Good Progress towards PT goals: Progressing toward goals    Frequency    Min 3X/week      PT Plan Current plan remains appropriate    Co-evaluation              AM-PAC PT "6 Clicks" Mobility   Outcome Measure  Help needed turning from your back to your side while in a flat bed without using bedrails?: A Lot Help needed moving from lying on your back to sitting on the side of a flat bed without using bedrails?: A Lot Help needed moving to and from a bed to a chair (including a wheelchair)?: A Little Help needed standing up from a chair using your arms (e.g.,  wheelchair or bedside chair)?: A Little Help needed to walk in hospital room?: A Little Help needed climbing 3-5 steps with a railing? : A Little 6 Click Score: 16    End of Session Equipment Utilized During Treatment: Oxygen(did not use gt belt due to abdominal incision) Activity Tolerance: Patient tolerated treatment well Patient left: in chair;with call bell/phone within reach;with chair alarm set Nurse Communication: Mobility status PT Visit Diagnosis: Muscle weakness (generalized) (M62.81)     Time: 6546-5035 PT Time Calculation (min) (ACUTE ONLY): 32 min  Charges:  $Gait Training: 8-22 mins $Therapeutic Activity: 8-22 mins                     Governor Rooks, PTA Acute Rehabilitation Services Pager [phone] Office [phone]     Jaquese Irving Eli Hose 07/30/2018, 12:45 PM

## 2018-07-30 NOTE — Progress Notes (Signed)
Nutrition Follow-up  RD working remotely.  DOCUMENTATION CODES:   Not applicable  INTERVENTION:   -Continue Ensure Enlive po BID, each supplement provides 350 kcal and 20 grams of protein -TPN management per pharmacy -RD will follow for diet advancement and ability to wean TPN; adjust supplement regimen as appropriate  NUTRITION DIAGNOSIS:   Increased nutrient needs related to post-op healing as evidenced by estimated needs.  Ongoing  GOAL:   Patient will meet greater than or equal to 90% of their needs  Progressing   MONITOR:   PO intake, Supplement acceptance, Diet advancement, Labs, Weight trends, Skin, I & O's  REASON FOR ASSESSMENT:   Consult New TPN/TNA  ASSESSMENT:   Patient with PMH significant for CAD s/p cath, HTN, stroke, TIA, and SBO s/p hernia repair in 2018. Presents this admission small bowel obstruction with ischemic bowel.   5/19 - ex-lap resection, LOA, VAC placement for open abdomen 5/21 - s/p ileocecectomy with ileocolonic anastomosis, abdominal closure 5/22 - extubated 5/23- NGT d/c, transferred from ICU to PCU 5/26- advanced to full liquids, Ensure added BID by general surgery  Pt just advanced to full liquids this morning and no documented meal intake available to assess yet. Per chart review, pt endorses poor appetite. Pt refused AM dose of Ensure, but accepted afternoon dose.  Pt now receiving TPN at goal rate of 70 ml/hr, which provides 1634 kcals and 100 grams protein, meeting 99% of estimated kcal and 100 % of estimated protein needs.  Per CIR admission coordinator, plan to d/c to CIR once medically stable.   Labs reviewed: CBGS: 128-140 (inpatient order for glycemic control are 0-9 units insulin aspart every 6 hours)  Diet Order:   Diet Order            Diet full liquid Room service appropriate? Yes; Fluid consistency: Thin  Diet effective now              EDUCATION NEEDS:   Not appropriate for education at this  time  Skin:  Skin Assessment: Skin Integrity Issues: Skin Integrity Issues:: Incisions Wound Vac: d/c on 07/25/18 Incisions: closed abdomen  Last BM:  07/30/18  Height:   Ht Readings from Last 1 Encounters:  07/23/18 5' 2.5" (1.588 m)    Weight:   Wt Readings from Last 1 Encounters:  07/28/18 73.7 kg    Ideal Body Weight:  52.3 kg  BMI:  Body mass index is 29.24 kg/m.  Estimated Nutritional Needs:   Kcal:  1650-1850  Protein:  85-105 grams  Fluid:  >/= 1.5 L    Ayaana Biondo A. Jimmye Norman, RD, LDN, [place] Registered Dietitian II Certified Diabetes Care and Education Specialist Pager: [phone] After hours Pager: [phone]

## 2018-07-30 NOTE — Progress Notes (Signed)
[place] CONSULT NOTE   Pharmacy Consult for TPN Indication: Prolonged ileus  Patient Measurements: Height: 5' 2.5" (158.8 cm) Weight: 162 lb 7.7 oz (73.7 kg) IBW/kg (Calculated) : 51.25 TPN AdjBW (KG): 54.7 Body mass index is 29.24 kg/m. Usual Weight: 63-69kg  Assessment: Rachel Park is an 85yo female admitted with hx of SBO with N/V and epigastric pain on 5/18. Later taken to OR for exploratory laparotomy and found to have necrotic bowel with resection with open abdomen followed by closure/ileocecectomy/ileocolonic anastomosis. Surgery consulted pharmacy for TPN as would like patient to have bowel rest and expects prolonged ileus.  GI: s/p bowel resection with expectation of prolonged ileus as above; 5/23 NGT came out, will allow trial without; 80 mls emesis/NGT output prior to it coming out Prealb 6.8; LBM 5/25 Endo: BG < 140s now with SSI ordered Insulin requirements in the past 24 hours: 2 units Lytes: K 3.7 (goal > 4); Cor Cal ok; Mg 1.9 (goal > 2) Renal:SCr 0.34, UOP 1 ml/kg/hr Pulm: extubated on RA Cards: BP 120s/60s with HR tachy 110-120s Hepatobil:t bili 0.8, AST/ALT WNL Trigs 103 Neuro: nml ID: [email or web address] abx  TPN Access: PICC line placed 5/19 TPN start date: 5/22 Nutritional Goals (per RD recommendation on 07/26/2018):  Kcal: 1500-1700 Protein: 85-105 grams Fluid: >/= 1.5 L  Current Nutrition: TPN Start full liquid diet  Plan:  Advance TPN to goal 70 mL/hr, also add lipids as patient out of the ICU  This TPN provides 100 g protein, 44 g lipids, and total of 1634 cal/day; meeting 100% of patient needs  Electrolytes in TPN: standard, continue inc phos (will not adjust today due to advancement to goal) Continue MVI, trace elements MWF only due to shortage Continue SSI q4h -  dec to q6   Mag 2 g x 1 KCl 10 mEq x 3  Monitor TPN labs; Bmet Mg and Phos in AM Monitor po intake (full liquid diet)  Alanda Slim, PharmD,  [facility] Clinical Pharmacist Please see AMION for all Pharmacists' Contact Phone Numbers 07/30/2018, 7:53 AM

## 2018-07-30 NOTE — Progress Notes (Signed)
Progress Note  Patient Name: Rachel Park Date of Encounter: 07/30/2018  Primary Cardiologist: Mertie Moores, MD   Subjective   No dyspnea or CP; complains of abdominal pain  Inpatient Medications    Scheduled Meds: . acetaminophen  650 mg Oral Q6H  . chewing gum (ORBIT) sugar free  1 Stick Oral QID  . chlorhexidine  15 mL Mouth Rinse BID  . Chlorhexidine Gluconate Cloth  6 each Topical Daily  . enoxaparin (LOVENOX) injection  40 mg Subcutaneous Q24H  . feeding supplement (ENSURE ENLIVE)  237 mL Oral BID BM  . insulin aspart  0-9 Units Subcutaneous Q6H  . levothyroxine  75 mcg Oral QAC breakfast  . mouth rinse  15 mL Mouth Rinse q12n4p  . metoprolol tartrate  5 mg Intravenous 6 X Daily  . pantoprazole  40 mg Oral Daily  . sodium chloride flush  10-40 mL Intracatheter Q12H   Continuous Infusions: . potassium chloride 10 mEq (07/30/18 0950)  . TPN ADULT (ION) 70 mL/hr at 07/29/18 1737  . TPN ADULT (ION)     PRN Meds: HYDROmorphone (DILAUDID) injection, metoprolol tartrate, ondansetron **OR** ondansetron (ZOFRAN) IV, sodium chloride flush   Vital Signs    Vitals:   07/29/18 1757 07/29/18 2100 07/30/18 0534 07/30/18 0945  BP: 127/90 115/74 120/89 127/78  Pulse: 98 (!) 103 98 (!) 113  Resp: (!) 24 (!) 29 (!) 24 (!) 25  Temp:  97.8 F (36.6 C) 98 F (36.7 C) 98 F (36.7 C)  TempSrc:  Oral Oral Oral  SpO2: 96%   94%  Weight:      Height:        Intake/Output Summary (Last 24 hours) at 07/30/2018 1044 Last data filed at 07/30/2018 0819 Gross per 24 hour  Intake 10 ml  Output 1275 ml  Net -1265 ml   Last 3 Weights 07/28/2018 07/27/2018 07/26/2018  Weight (lbs) 162 lb 7.7 oz 153 lb 3.5 oz 152 lb 12.5 oz  Weight (kg) 73.7 kg 69.5 kg 69.3 kg      Telemetry    Atrial fibrillation rate mildly elevated- Personally Reviewed  Physical Exam   GEN: No acute distress.   Neck: No JVD Cardiac: irregular and tachycardic Respiratory: Clear to auscultation bilaterally.  GI: Mild tenderness to palpation; Dressing in place MS: No edema; No deformity. Neuro:  Nonfocal  Psych: Normal affect   Labs    Chemistry Recent Labs  Lab 07/24/18 0353  07/27/18 0336 07/28/18 0322 07/29/18 0403 07/30/18 0302  NA 134*   < > 139 139 135 135  K 4.6   < > 3.7 4.1 3.5 3.7  CL 108   < > 114* 111 102 102  CO2 18*   < > 22 23 25 28   GLUCOSE 146*   < > 155* 138* 121* 124*  BUN 21   < > 18 19 14 21   CREATININE 0.83   < > 0.43* 0.39* 0.32* 0.34*  CALCIUM 8.2*   < > 7.9* 8.0* 7.7* 7.9*  PROT  --   --  4.4*  --  4.5*  --   ALBUMIN 3.0*  --  2.0*  --  1.9*  --   AST  --   --  21  --  13*  --   ALT  --   --  24  --  20  --   ALKPHOS  --   --  44  --  50  --   BILITOT  --   --  1.0  --  0.8  --   GFRNONAA >60   < > >60 >60 >60 >60  GFRAA >60   < > >60 >60 >60 >60  ANIONGAP 8   < > 3* 5 8 5    < > = values in this interval not displayed.     Hematology Recent Labs  Lab 07/27/18 0336 07/28/18 0322 07/29/18 0403  WBC 8.8 9.1 10.5  RBC 3.16* 3.03* 3.20*  HGB 9.6* 9.2* 9.7*  HCT 29.7* 28.6* 29.8*  MCV 94.0 94.4 93.1  MCH 30.4 30.4 30.3  MCHC 32.3 32.2 32.6  RDW 15.5 15.7* 15.5  PLT 172 199 233     Cardiac Studies   Echo:  04/21/18  1. The left ventricle has normal systolic function, with an ejection fraction of 55-60%. The cavity size was normal. Mild basal septal hypertrophy. Left ventricular diastology could not be evaluated secondary to atrial fibrillation. 2. The right ventricle has normal systolic function. The cavity was normal. There is no increase in right ventricular wall thickness. 3. Left atrial size was moderately dilated. 4. The mitral valve is normal in structure. 5. The tricuspid valve is normal in structure. Tricuspid valve regurgitation is mild-moderate. 6. The aortic valve is tricuspid Mild sclerosis of the aortic valve. 7. The pulmonic valve was normal in structure. Pulmonic valve regurgitation is mild by color flow Doppler. 8.  Right atrial pressure is estimated at 3 mmHg.   Patient Profile     85 y.o. female with atrial fib and CAD.  We are seeing her post op for atrial fib.    Assessment & Plan    1 Persistent atrial fibrillation-rate mildly elevated; continue IV metoprolol; transition to preadmission doses of oral metoprolol and cardizem when able. Resume apixaban when ok with surgery. Ultimate goal will be DCCV once pt recovers from surgery.  2 small bowel resection s/p ileocecetomy-per surgery  3 CAD-resume statin at DC.  4 possible liver lesion -abd ultrasound as outpt recommended.  For questions or updates, please contact [place] Please consult [email or web address] for contact info under        Signed, Kirk Ruths, MD  07/30/2018, 10:44 AM

## 2018-07-30 NOTE — Progress Notes (Signed)
Inpatient Rehabilitation-Admissions Coordinator    Met with patient at the bedside to discuss team's recommendation for inpatient rehabilitation. Shared booklets, expectations while in CIR, expected length of stay, and anticipated functional level at DC. Pt interested in CIR and would like to pursue once medically ready. AC did speak with pt's son to confirm plan for some supervision as needed.  Plan to follow for timing of medical readiness, insurance authorization. Please call if questions.   Jhonnie Garner, OTR/L  Rehab Admissions Coordinator  [phone] 07/30/2018 11:44 AM

## 2018-07-31 LAB — CBC
HCT: 30.2 % — ABNORMAL LOW (ref 36.0–46.0)
Hemoglobin: 10.1 g/dL — ABNORMAL LOW (ref 12.0–15.0)
MCH: 30.3 pg (ref 26.0–34.0)
MCHC: 33.4 g/dL (ref 30.0–36.0)
MCV: 90.7 fL (ref 80.0–100.0)
Platelets: 353 10*3/uL (ref 150–400)
RBC: 3.33 MIL/uL — ABNORMAL LOW (ref 3.87–5.11)
RDW: 15.1 % (ref 11.5–15.5)
WBC: 11.6 10*3/uL — ABNORMAL HIGH (ref 4.0–10.5)
nRBC: 0 % (ref 0.0–0.2)

## 2018-07-31 LAB — GLUCOSE, CAPILLARY
Glucose-Capillary: 129 mg/dL — ABNORMAL HIGH (ref 70–99)
Glucose-Capillary: 105 mg/dL — ABNORMAL HIGH (ref 70–99)
Glucose-Capillary: 111 mg/dL — ABNORMAL HIGH (ref 70–99)
Glucose-Capillary: 115 mg/dL — ABNORMAL HIGH (ref 70–99)
Glucose-Capillary: 128 mg/dL — ABNORMAL HIGH (ref 70–99)

## 2018-07-31 LAB — PHOSPHORUS: Phosphorus: 3.7 mg/dL (ref 2.5–4.6)

## 2018-07-31 LAB — BASIC METABOLIC PANEL
Anion gap: 9 (ref 5–15)
BUN: 26 mg/dL — ABNORMAL HIGH (ref 8–23)
CO2: 26 mmol/L (ref 22–32)
Calcium: 8 mg/dL — ABNORMAL LOW (ref 8.9–10.3)
Chloride: 98 mmol/L (ref 98–111)
Creatinine, Ser: 0.4 mg/dL — ABNORMAL LOW (ref 0.44–1.00)
GFR calc Af Amer: >60 mL/min (ref >60)
GFR calc non Af Amer: >60 mL/min (ref >60)
Glucose, Bld: 120 mg/dL — ABNORMAL HIGH (ref 70–99)
Potassium: 4.6 mmol/L (ref 3.5–5.1)
Sodium: 133 mmol/L — ABNORMAL LOW (ref 135–145)

## 2018-07-31 LAB — MAGNESIUM: Magnesium: 2 mg/dL (ref 1.7–2.4)

## 2018-07-31 MED ORDER — BOOST / RESOURCE BREEZE PO LIQD CUSTOM
1.0000 | Freq: Three times a day (TID) | ORAL | Status: DC
  Administered 2018-07-31 – 2018-08-05 (×14): 1 via ORAL

## 2018-07-31 MED ORDER — ACETAMINOPHEN 500 MG PO TABS
1000.0000 mg | ORAL_TABLET | Freq: Four times a day (QID) | ORAL | Status: DC
  Administered 2018-07-31 – 2018-08-12 (×48): 1000 mg via ORAL
  Filled 2018-07-31 (×49): qty 2

## 2018-07-31 MED ORDER — TRAVASOL 10 % IV SOLN
INTRAVENOUS | Status: AC
  Administered 2018-07-31: 18:00:00 via INTRAVENOUS
  Filled 2018-07-31: qty 991.2

## 2018-07-31 MED ORDER — APIXABAN 5 MG PO TABS
5.0000 mg | ORAL_TABLET | Freq: Two times a day (BID) | ORAL | Status: DC
  Administered 2018-07-31 – 2018-08-12 (×25): 5 mg via ORAL
  Filled 2018-07-31 (×25): qty 1

## 2018-07-31 MED ORDER — DILTIAZEM HCL 60 MG PO TABS
60.0000 mg | ORAL_TABLET | Freq: Three times a day (TID) | ORAL | Status: DC
  Administered 2018-07-31 – 2018-08-01 (×3): 60 mg via ORAL
  Filled 2018-07-31 (×3): qty 1

## 2018-07-31 MED ORDER — METOPROLOL TARTRATE 25 MG PO TABS
25.0000 mg | ORAL_TABLET | Freq: Two times a day (BID) | ORAL | Status: DC
  Administered 2018-07-31 – 2018-08-12 (×23): 25 mg via ORAL
  Filled 2018-07-31 (×25): qty 1

## 2018-07-31 NOTE — Consult Note (Addendum)
   [facility] Inpatient Consult   07/31/2018  Rachel Park 1931/04/01 [id number]    Patient'schart has been reviewed as benefitfromher [employer] Medicare plan and forhigh risk scoreof23%for unplanned readmissions, with 3 hospitalizations in the last 6 months.   History and physical dated 07/22/18 and chart review, show as follows: Rachel Park a 85 y.o.femalepresenting with one day of nausea and vomiting found to have ischemic bowel. PMH is significant for small bowel obstruction, hernia, A-fib, CVA, hypothyroidism, HTN, CAD. Patient was admitted for abdominal pain and a small bowel obstruction, sadly, she required surgery and was found to have a significant length of necrotic small bowel from an incarcerated internal hernia which required ICU stay. (small bowel resection s/p ileocecectomy, persistent atrial fibrillation).  PT and OT reviewed notes, currently recommend inpatient level rehab therapies (CIR- [facility]). TPN management per pharmacy. RD following patient on this admission.  Per Inpatient Rehab Admissions Coordinator note, patient is interested in CIR and would like to pursue once medically ready. She lives alone at home.   Will followfordisposition.Ifthere are any changes in dispositionand needs for appropriate community follow-up,please referto [facility] care management.  Of note, [facility] Care Management services does not replace or interfere with any services.  Primary care provider is Dr. Lajean Manes with [facility] at [facility], listed as providing transition of care follow-up.   For questions and additional information, please call:  Marvia Troost A. Stavros Cail, BSN, RN-BC [facility] Liaison Cell: [phone]

## 2018-07-31 NOTE — Progress Notes (Signed)
Nutrition Follow-up  RD working remotely.  DOCUMENTATION CODES:   Not applicable  INTERVENTION:   -TPN management per pharmacy -Continue Boost Breeze po TID, each supplement provides 250 kcal and 9 grams of protein -Initiate 48 hour calorie count per MD request  NUTRITION DIAGNOSIS:   Increased nutrient needs related to post-op healing as evidenced by estimated needs.  Ongoing  GOAL:   Patient will meet greater than or equal to 90% of their needs  Met with TPN  MONITOR:   PO intake, Supplement acceptance, Diet advancement, Labs, Weight trends, Skin, I & O's  REASON FOR ASSESSMENT:   Consult New TPN/TNA  ASSESSMENT:   Patient with PMH significant for CAD s/p cath, HTN, stroke, TIA, and SBO s/p hernia repair in 2018. Presents this admission small bowel obstruction with ischemic bowel.   5/19 - ex-lap resection,LOA, VAC placement for open abdomen 5/21 - s/p ileocecectomy with ileocolonic anastomosis, abdominal closure 5/22 - extubated 5/23- NGT d/c, transferred from ICU to PCU 5/26- advanced to full liquids, Ensure added BID by general surgery 5/27- advanced to soft diet   Reviewed I/O's: +1.5 L x 24 hours and +8.1 L since admission  UOP: 300 ml x 24 hours  Attempted to call pt x 4, however, unable to reach. Phone message reports "call cannot be completed as dialed".   Calorie count ordered this AM, however, no meal intake data documented to assess at this time. Chart review indicates that pt with poor appetite. She is refusing Ensure supplements because they are too sweet. General surgery ordered Boost Breeze and calorie count to determine if TPN can be weaned. Pt with necrotic tissue in abdominal wound and dressing changes have been changed to TID for debridement.   Per pharmacy note, pt continues to receive TPN at goal rate of 70 ml/hr, which provides 1634 kcals and 100 grams protein daily, meeting meeting 99% of estimated kcal and 100 % of estimated protein  needs.  Per CIR admission coordinator, plan to d/c to CIR once medically stable.   Labs reviewed: Na: 133, CBGS: 105-129 (inpatient orders for glycemic control are 0-9 units insulin aspart every 6 hours).   Diet Order:   Diet Order            DIET SOFT Room service appropriate? Yes; Fluid consistency: Thin  Diet effective now              EDUCATION NEEDS:   Not appropriate for education at this time  Skin:  Skin Assessment: Skin Integrity Issues: Skin Integrity Issues:: Incisions Wound Vac: d/c on 07/25/18 Incisions: closed abdomen  Last BM:  07/30/18  Height:   Ht Readings from Last 1 Encounters:  07/23/18 5' 2.5" (1.588 m)    Weight:   Wt Readings from Last 1 Encounters:  07/31/18 73.4 kg    Ideal Body Weight:  52.3 kg  BMI:  Body mass index is 29.13 kg/m.  Estimated Nutritional Needs:   Kcal:  1650-1850  Protein:  85-105 grams  Fluid:  >/= 1.5 L    Marlo Arriola A. Jimmye Norman, RD, LDN, [place] Registered Dietitian II Certified Diabetes Care and Education Specialist Pager: [phone] After hours Pager: [phone]

## 2018-07-31 NOTE — Progress Notes (Signed)
Patient ID: Rachel Park, female   DOB: 07/24/1931, 85 y.o.   MRN: [id number]    6 Days Post-Op  Subjective: Feels ok this morning, but just not much appetite.  Didn't eat much yesterday.  Ensure too sweet.  3 BMs yesterday.  No blood present.  Minimal nausea this am at 0500.  Objective: Vital signs in last 24 hours: Temp:  [97.8 F (36.6 C)-99.1 F (37.3 C)] 97.8 F (36.6 C) (05/27 0439) Pulse Rate:  [91-113] 108 (05/27 0741) Resp:  [18-28] 18 (05/27 0741) BP: (117-141)/(78-94) 120/80 (05/27 0741) SpO2:  [92 %-96 %] 95 % (05/27 0741) Weight:  [73.4 kg] 73.4 kg (05/27 0300) Last BM Date: 07/30/18  Intake/Output from previous day: 05/26 0701 - 05/27 0700 In: 1789.7 [P.O.:680; I.V.:909.7; IV Piggyback:200] Out: 300 [Urine:300] Intake/Output this shift: No intake/output data recorded.  PE: Heart: irregular Lungs: CTAB Abd: soft, appropriately tender, +BS, midline wound has an overall necrotic base.  Sides are clean.       Lab Results:  Recent Labs    07/29/18 0403  WBC 10.5  HGB 9.7*  HCT 29.8*  PLT 233   BMET Recent Labs    07/30/18 0302 07/31/18 0335  NA 135 133*  K 3.7 4.6  CL 102 98  CO2 28 26  GLUCOSE 124* 120*  BUN 21 26*  CREATININE 0.34* 0.40*  CALCIUM 7.9* 8.0*   PT/INR No results for input(s): LABPROT, INR in the last 72 hours. CMP     Component Value Date/Time   NA 133 (L) 07/31/2018 0335   NA 133 (L) 02/21/2018 0817   K 4.6 07/31/2018 0335   CL 98 07/31/2018 0335   CO2 26 07/31/2018 0335   GLUCOSE 120 (H) 07/31/2018 0335   BUN 26 (H) 07/31/2018 0335   BUN 11 02/21/2018 0817   CREATININE 0.40 (L) 07/31/2018 0335   CREATININE 0.46 (L) 06/30/2015 0848   CALCIUM 8.0 (L) 07/31/2018 0335   PROT 4.5 (L) 07/29/2018 0403   PROT 6.0 02/21/2018 0817   ALBUMIN 1.9 (L) 07/29/2018 0403   ALBUMIN 4.1 02/21/2018 0817   AST 13 (L) 07/29/2018 0403   ALT 20 07/29/2018 0403   ALKPHOS 50 07/29/2018 0403   BILITOT 0.8 07/29/2018 0403   BILITOT 0.3  02/21/2018 0817   GFRNONAA >60 07/31/2018 0335   GFRAA >60 07/31/2018 0335   Lipase     Component Value Date/Time   LIPASE 27 07/22/2018 1903       Studies/Results: No results found.  Anti-infectives: Anti-infectives (From admission, onward)   Start     Dose/Rate Route Frequency Ordered Stop   07/25/18 1000  ceFAZolin (ANCEF) IVPB 2g/100 mL premix     2 g 200 mL/hr over 30 Minutes Intravenous To Short Stay 07/25/18 0749 07/25/18 1200   07/25/18 0745  ceFAZolin (ANCEF) IVPB 2g/100 mL premix  Status:  Discontinued     2 g 200 mL/hr over 30 Minutes Intravenous  Once 07/25/18 0735 07/25/18 0749   07/23/18 1000  cefoTEtan (CEFOTAN) 1 g in sodium chloride 0.9 % 100 mL IVPB     1 g 200 mL/hr over 30 Minutes Intravenous  Once 07/23/18 0825 07/23/18 1117       Assessment/Plan POD8/6elap, sbr, open abdomen followed by closure/ileocecectomy/ileocolonic anastomosis-[place] -adv to soft diet as she had 3 BMs yesterday -DC ensure as she doesn't like it.  Will try [employer] -calorie count to see if we can start to wean TNA -mobilize and cont to work with  therapies.  Ok for tx to rehab from our standpoint as we can continue to follow her there. -ok to resume eliquis -cont scheduled tylenol and prn oxy -midline wound is necrotic.  Will increase WD dressing changes to TID to continue to debride this tissue.  Afib -Cardiology following -may resume eliquis  FEN - soft diet, resource breeze, calorie count, cont TNA for now VTE - Lovenox, may resume eliquis ID - none currently   LOS: 9 days    Henreitta Cea , [facility] Surgery 07/31/2018, 8:22 AM Pager: [phone]

## 2018-07-31 NOTE — Progress Notes (Signed)
Family Medicine Teaching Service Daily Progress Note Intern Pager: [phone]  Patient name: Rachel Park Medical record number: [id number] Date of birth: 01/05/1932 Age: 85 y.o. Gender: female  Primary Care Provider: Lajean Manes, MD Consultants: CCM, gen surg, cardiology Code Status: full  Pt Overview and Major Events to Date:  5/18 admitted to [facility] 5/19 ex lap, underwent colonic resection 5/21 underwent ileocolic anastomosis 2/13 extubated 5/24 out to floor, care resumed by FPTS  Assessment and Plan: 85 year old female who presented with severe abdominal pain.  Found to have ischemic bowel on ex lap, bowel resected and underwent reanastomosis.  Has developed ileus postoperatively, extubated on 5/22 and transferred out of floor on 5/24.  Post-operative Ileus, resolved Status post ileocolic anastomosis. Patient hhad 3 BM yesterday.   Receiving TPN, managed by surgery and pharmacy.   General surgery following appreciate their recommendations.  Transitioning to PO meds. PT seeing pt, awaiting CIR placement.  -Vital signs per floor routine -Continue to ambulate with PT/OT, can receive as needed pain medication prior to ambulating -TPN, pharmacy to dose,  -Zofran PRN - oxy 2.5mg  q4h PRN - tylenol q6h - medically stable for inpatient rehab   Status post ileocolic anastomosis secondary to ischemic bowel General surgery following appreciate their recommendations.  Wound appears to have some necrosis. Surgery increasing wet to dry dressing to 3 times a day.  - Daily CBC, BMP - surgery following, appreciate recommendations - pt to go to CIR  History of A. Fib  Cardiology following appreciate the recommendations.  plan to transition to oral metoprolol today.  HR in 90s today.   Continue to monitor electrolytes, especially in setting of TPN. - d/c iv metop, switch to cardizem 60mg  q8h and metop 25mg  BID.  -d/c lovenox and start apixaban 5mg  BID -Daily BMP  Small left pleural  effusion Unclear etiology.  Likely secondary to atelectasis.  No progression from last x-ray.  We will continue to monitor for fever or leukocytosis.   - incentive spirometry  Hypothyroidism -  Pt takes 38mcg at home daily.  - transitioned from IV to PO synthroid today  Hypocalcemia Calcium 7.9>8.0>7.7>7.9  When corrected for low albumin calcium is in the normal range.  Expect albumin to improve once able to tolerate better p.o.  - Monitor with daily calcium  Normocytic anemia   Likely secondary to blood loss during surgery. Cbc this am shows stable hgb at 11.6  FEN/GI: N.p.o., sips with meds and ice chips allowed. PPx: Pantoprazole 40 mg, Lovenox  Disposition: likely CIR  Subjective:  Patient stated she became somewhat nauseous with her food yesterday but did not vomit.  She had 3 total BMs yesterday.  She says she still has some discomfort but feels it is well controlled with her current pain medicine. .    Objective: Temp:  [97.8 F (36.6 C)-99.1 F (37.3 C)] 97.8 F (36.6 C) (05/27 0439) Pulse Rate:  [91-113] 105 (05/27 0446) Resp:  [20-28] 24 (05/27 0446) BP: (117-141)/(78-94) 141/86 (05/27 0439) SpO2:  [92 %-96 %] 94 % (05/27 0446) Weight:  [73.4 kg] 73.4 kg (05/27 0300) Physical Exam: General: alert, oriented.  No acute distress Cardiovascular: irregularly irregular rhythm.  HR in the 110 range. No murmurs. No pedal edema.   Respiratory: LCTAB.  No wheezes or crackles.  Abdomen: TTP to light touch along the site of incision. Similar to yesterday's exam.  Normal bowel sounds.         Laboratory: Recent Labs  Lab 07/27/18 [id number] 07/28/18 7846  07/29/18 0403  WBC 8.8 9.1 10.5  HGB 9.6* 9.2* 9.7*  HCT 29.7* 28.6* 29.8*  PLT 172 199 233   Recent Labs  Lab 07/27/18 0336  07/29/18 0403 07/30/18 0302 07/31/18 0335  NA 139   < > 135 135 133*  K 3.7   < > 3.5 3.7 4.6  CL 114*   < > 102 102 98  CO2 22   < > 25 28 26   BUN 18   < > 14 21 26*  CREATININE 0.43*    < > 0.32* 0.34* 0.40*  CALCIUM 7.9*   < > 7.7* 7.9* 8.0*  PROT 4.4*  --  4.5*  --   --   BILITOT 1.0  --  0.8  --   --   ALKPHOS 44  --  50  --   --   ALT 24  --  20  --   --   AST 21  --  13*  --   --   GLUCOSE 155*   < > 121* 124* 120*   < > = values in this interval not displayed.      Benay Pike, MD 07/31/2018, 6:44 AM PGY-1, [place] Intern pager: [phone], text pages welcome

## 2018-07-31 NOTE — Progress Notes (Signed)
Inpatient Rehabilitation-Admissions Coordinator   [facility] met with pt bedside. Pt reports nausea and only able to tolerate a few bites of soft food at this time. Pt appears to have some discomfort at times with HR up to 128 on monitor while AC in room and pt supine. Noted dressing changes increased to TID due to necrotic tissue.   Would like to monitor additional day for tolerance of diet, overall tolerance with therapy since change in pain medication from IV to PO, and wound appearance since increase in dressing changes.   Please call if questions.   Jhonnie Garner, OTR/L  Rehab Admissions Coordinator  [phone] 07/31/2018 12:49 PM

## 2018-07-31 NOTE — Progress Notes (Signed)
[place] CONSULT NOTE   Pharmacy Consult for TPN Indication: Prolonged ileus  Patient Measurements: Height: 5' 2.5" (158.8 cm) Weight: 161 lb 13.1 oz (73.4 kg) IBW/kg (Calculated) : 51.25 TPN AdjBW (KG): 54.7 Body mass index is 29.13 kg/m. Usual Weight: 63-69kg  Assessment: Rachel Park is an 85yo female admitted with hx of SBO with N/V and epigastric pain on 5/18. Later taken to OR for exploratory laparotomy and found to have necrotic bowel with resection with open abdomen followed by closure/ileocecectomy/ileocolonic anastomosis. Surgery consulted pharmacy for TPN as would like patient to have bowel rest and expects prolonged ileus.  GI: s/p bowel resection with expectation of prolonged ileus as above; 5/23 NGT came out, will allow trial without; 80 mls emesis/NGT output prior to it coming out Prealb 6.8; LBM 5/26 Endo: BG < 140s now with SSI ordered Insulin requirements in the past 24 hours: 2 units Lytes: Na 133, K 4.6 (goal > 4); Cor Cal ok; Mg 2 (goal > 2), phos 3.7 Renal:SCr 0.4, UOP 0.2 ml/kg/hr + 1 unmeasured occurence Pulm: extubated on RA Cards: BP 120s/60s with HR tachy 110-120s Hepatobil:t bili 0.8, AST/ALT WNL Trigs 103 Neuro: nml ID: periop abx  TPN Access: PICC line placed 5/19 TPN start date: 5/22 Nutritional Goals (per RD recommendation on 07/26/2018):  Kcal: 1500-1700 Protein: 85-105 grams Fluid: >/= 1.5 L  Current Nutrition: TPN Start full liquid diet 5/27 start calorie count to see if we can start to wean TNA  Plan:  Advance TPN to goal 70 mL/hr, also add lipids as patient out of the ICU  This TPN provides 100 g protein, 44 g lipids, and total of 1634 cal/day; meeting 100% of patient needs  Electrolytes in TPN: standard, increase Na, decr phos to normal Continue MVI, trace elements MWF only due to shortage Continue SSI q4h -  dec to q6   Monitor TPN labs Monitor po intake (full liquid diet) and calorie  count  Alanda Slim, PharmD, [place] Clinical Pharmacist Please see AMION for all Pharmacists' Contact Phone Numbers 07/31/2018, 8:37 AM

## 2018-07-31 NOTE — Progress Notes (Signed)
Progress Note  Patient Name: Rachel Park Date of Encounter: 07/31/2018  Primary Cardiologist: Mertie Moores, MD   Subjective   Some dyspnea with activities; no CP  Inpatient Medications    Scheduled Meds: . acetaminophen  1,000 mg Oral Q6H  . chewing gum (ORBIT) sugar free  1 Stick Oral QID  . chlorhexidine  15 mL Mouth Rinse BID  . Chlorhexidine Gluconate Cloth  6 each Topical Daily  . enoxaparin (LOVENOX) injection  40 mg Subcutaneous Q24H  . feeding supplement  1 Container Oral TID BM  . insulin aspart  0-9 Units Subcutaneous Q6H  . levothyroxine  75 mcg Oral QAC breakfast  . mouth rinse  15 mL Mouth Rinse q12n4p  . metoprolol tartrate  5 mg Intravenous 6 X Daily  . pantoprazole  40 mg Oral Daily  . sodium chloride flush  10-40 mL Intracatheter Q12H   Continuous Infusions: . TPN ADULT (ION) 70 mL/hr at 07/31/18 0700  . TPN ADULT (ION)     PRN Meds: metoprolol tartrate, ondansetron **OR** ondansetron (ZOFRAN) IV, oxyCODONE, sodium chloride flush   Vital Signs    Vitals:   07/31/18 0439 07/31/18 0444 07/31/18 0446 07/31/18 0741  BP: (!) 141/86   120/80  Pulse: 91 (!) 104 (!) 105 (!) 108  Resp: (!) 28 (!) 27 (!) 24 18  Temp: 97.8 F (36.6 C)     TempSrc: Axillary     SpO2: 96% 95% 94% 95%  Weight:      Height:        Intake/Output Summary (Last 24 hours) at 07/31/2018 0922 Last data filed at 07/31/2018 0700 Gross per 24 hour  Intake 1779.71 ml  Output 300 ml  Net 1479.71 ml   Last 3 Weights 07/31/2018 07/28/2018 07/27/2018  Weight (lbs) 161 lb 13.1 oz 162 lb 7.7 oz 153 lb 3.5 oz  Weight (kg) 73.4 kg 73.7 kg 69.5 kg      Telemetry    Atrial fibrillation rate mildly elevated- Personally Reviewed  Physical Exam   GEN: No acute distress.  WD WN Neck: No JVD, supple Cardiac: irregular and mildly tachycardic Respiratory: CTA anteriorly GI: Mild tenderness to palpation; Dressing in place MS: No edema Neuro:  Grossly intact Psych: Normal affect    Labs    Chemistry Recent Labs  Lab 07/27/18 0336  07/29/18 0403 07/30/18 0302 07/31/18 0335  NA 139   < > 135 135 133*  K 3.7   < > 3.5 3.7 4.6  CL 114*   < > 102 102 98  CO2 22   < > 25 28 26   GLUCOSE 155*   < > 121* 124* 120*  BUN 18   < > 14 21 26*  CREATININE 0.43*   < > 0.32* 0.34* 0.40*  CALCIUM 7.9*   < > 7.7* 7.9* 8.0*  PROT 4.4*  --  4.5*  --   --   ALBUMIN 2.0*  --  1.9*  --   --   AST 21  --  13*  --   --   ALT 24  --  20  --   --   ALKPHOS 44  --  50  --   --   BILITOT 1.0  --  0.8  --   --   GFRNONAA >60   < > >60 >60 >60  GFRAA >60   < > >60 >60 >60  ANIONGAP 3*   < > 8 5 9    < > =  values in this interval not displayed.     Hematology Recent Labs  Lab 07/27/18 0336 07/28/18 0322 07/29/18 0403  WBC 8.8 9.1 10.5  RBC 3.16* 3.03* 3.20*  HGB 9.6* 9.2* 9.7*  HCT 29.7* 28.6* 29.8*  MCV 94.0 94.4 93.1  MCH 30.4 30.4 30.3  MCHC 32.3 32.2 32.6  RDW 15.5 15.7* 15.5  PLT 172 199 233     Cardiac Studies   Echo:  04/21/18  1. The left ventricle has normal systolic function, with an ejection fraction of 55-60%. The cavity size was normal. Mild basal septal hypertrophy. Left ventricular diastology could not be evaluated secondary to atrial fibrillation. 2. The right ventricle has normal systolic function. The cavity was normal. There is no increase in right ventricular wall thickness. 3. Left atrial size was moderately dilated. 4. The mitral valve is normal in structure. 5. The tricuspid valve is normal in structure. Tricuspid valve regurgitation is mild-moderate. 6. The aortic valve is tricuspid Mild sclerosis of the aortic valve. 7. The pulmonic valve was normal in structure. Pulmonic valve regurgitation is mild by color flow Doppler. 8. Right atrial pressure is estimated at 3 mmHg.   Patient Profile     85 y.o. female with atrial fib and CAD.  We are seeing her post op for atrial fib.    Assessment & Plan    1 Persistent atrial  fibrillation-rate remains mildly elevated but likely driven by hyperadrenergic state associated with recent surgery and pain.  She is able to take oral medications by report.  Discontinue IV Lopressor.  Begin Cardizem 60 mg every 8 hours (patient was on 180 mg daily at home) and metoprolol 25 mg twice daily.  Follow heart rate and adjust regimen as needed.  Per surgery can now anticoagulate.  Discontinue subcutaneous Lovenox.  Begin apixaban 5 mg twice daily.  Once patient recovers from surgery the ultimate goal will be to proceed with cardioversion.    2 small bowel resection s/p ileocecetomy-per surgery  3 CAD-resume statin at DC.  4 possible liver lesion -abd ultrasound as outpt recommended.  For questions or updates, please contact [place] Please consult [email or web address] for contact info under        Signed, Kirk Ruths, MD  07/31/2018, 9:22 AM

## 2018-07-31 NOTE — Progress Notes (Signed)
Patient ambulated using front rolling walker approximately 120 ft

## 2018-08-01 ENCOUNTER — Inpatient Hospital Stay (HOSPITAL_COMMUNITY): Payer: Medicare HMO

## 2018-08-01 DIAGNOSIS — K567 Ileus, unspecified: Secondary | ICD-10-CM

## 2018-08-01 LAB — COMPREHENSIVE METABOLIC PANEL
ALT: 23 U/L (ref 0–44)
AST: 22 U/L (ref 15–41)
Albumin: 1.8 g/dL — ABNORMAL LOW (ref 3.5–5.0)
Alkaline Phosphatase: 72 U/L (ref 38–126)
Anion gap: 10 (ref 5–15)
BUN: 22 mg/dL (ref 8–23)
CO2: 25 mmol/L (ref 22–32)
Calcium: 7.8 mg/dL — ABNORMAL LOW (ref 8.9–10.3)
Chloride: 96 mmol/L — ABNORMAL LOW (ref 98–111)
Creatinine, Ser: 0.42 mg/dL — ABNORMAL LOW (ref 0.44–1.00)
GFR calc Af Amer: >60 mL/min (ref >60)
GFR calc non Af Amer: >60 mL/min (ref >60)
Glucose, Bld: 105 mg/dL — ABNORMAL HIGH (ref 70–99)
Potassium: 4.4 mmol/L (ref 3.5–5.1)
Sodium: 131 mmol/L — ABNORMAL LOW (ref 135–145)
Total Bilirubin: 0.5 mg/dL (ref 0.3–1.2)
Total Protein: 4.6 g/dL — ABNORMAL LOW (ref 6.5–8.1)

## 2018-08-01 LAB — PHOSPHORUS: Phosphorus: 3.1 mg/dL (ref 2.5–4.6)

## 2018-08-01 LAB — GLUCOSE, CAPILLARY
Glucose-Capillary: 103 mg/dL — ABNORMAL HIGH (ref 70–99)
Glucose-Capillary: 110 mg/dL — ABNORMAL HIGH (ref 70–99)
Glucose-Capillary: 115 mg/dL — ABNORMAL HIGH (ref 70–99)
Glucose-Capillary: 118 mg/dL — ABNORMAL HIGH (ref 70–99)
Glucose-Capillary: 123 mg/dL — ABNORMAL HIGH (ref 70–99)
Glucose-Capillary: 126 mg/dL — ABNORMAL HIGH (ref 70–99)

## 2018-08-01 LAB — MAGNESIUM: Magnesium: 1.8 mg/dL (ref 1.7–2.4)

## 2018-08-01 MED ORDER — MAGNESIUM SULFATE 2 GM/50ML IV SOLN
2.0000 g | Freq: Once | INTRAVENOUS | Status: AC
  Administered 2018-08-01: 2 g via INTRAVENOUS
  Filled 2018-08-01: qty 50

## 2018-08-01 MED ORDER — OXYCODONE HCL 5 MG PO TABS
2.5000 mg | ORAL_TABLET | ORAL | Status: DC | PRN
  Administered 2018-08-01 – 2018-08-04 (×5): 5 mg via ORAL
  Administered 2018-08-05: 2.5 mg via ORAL
  Administered 2018-08-05: 5 mg via ORAL
  Administered 2018-08-06: 2.5 mg via ORAL
  Administered 2018-08-06 – 2018-08-08 (×5): 5 mg via ORAL
  Administered 2018-08-09 – 2018-08-10 (×2): 2.5 mg via ORAL
  Administered 2018-08-11: 5 mg via ORAL
  Filled 2018-08-01 (×17): qty 1

## 2018-08-01 MED ORDER — TRAVASOL 10 % IV SOLN
INTRAVENOUS | Status: AC
  Administered 2018-08-01: 18:00:00 via INTRAVENOUS
  Filled 2018-08-01: qty 1008

## 2018-08-01 MED ORDER — METHOCARBAMOL 500 MG PO TABS
500.0000 mg | ORAL_TABLET | Freq: Three times a day (TID) | ORAL | Status: DC | PRN
  Administered 2018-08-03: 500 mg via ORAL
  Filled 2018-08-01: qty 1

## 2018-08-01 MED ORDER — DILTIAZEM HCL ER COATED BEADS 180 MG PO CP24
180.0000 mg | ORAL_CAPSULE | Freq: Every day | ORAL | Status: DC
  Administered 2018-08-01 – 2018-08-12 (×11): 180 mg via ORAL
  Filled 2018-08-01 (×12): qty 1

## 2018-08-01 MED ORDER — RAMELTEON 8 MG PO TABS
8.0000 mg | ORAL_TABLET | Freq: Every day | ORAL | Status: DC
  Administered 2018-08-01 – 2018-08-11 (×11): 8 mg via ORAL
  Filled 2018-08-01 (×12): qty 1

## 2018-08-01 NOTE — Progress Notes (Signed)
Physical Therapy Treatment Patient Details Name: Rachel Park MRN: [id number] DOB: 12-01-1931 Today's Date: 08/01/2018    History of Present Illness 58 yof with SBO, failed medical management, taken to OR with findings of necrotic bowel s/p resection.  Abd left open with wound vac and returns to ICU on mechanical ventilation. Extubated 07/25/18    PT Comments    Pt seen for mobility progression. Pt is motivated to participate in therapy and regain independence. Patient is making progress toward PT goals and tolerated mobility without increased pain. Pt requires mod A for bed mobility for protection of incision site and min A for gait training this session. Continue to progress as tolerated.    Follow Up Recommendations  CIR     Equipment Recommendations  Rolling walker with 5" wheels    Recommendations for Other Services       Precautions / Restrictions Precautions Precautions: Fall Precaution Comments: abdominal incision. heart pillow in room to splint incision Restrictions Weight Bearing Restrictions: No    Mobility  Bed Mobility Overal bed mobility: Needs Assistance Bed Mobility: Rolling;Sidelying to Sit Rolling: Min assist Sidelying to sit: Mod assist;HOB elevated     Sit to sidelying: Mod assist General bed mobility comments: cues for seuqencing and assist to elevate trunk and bring bilat LE into bed to decrease strain on abdominals/incision  Transfers Overall transfer level: Needs assistance Equipment used: Rolling walker (2 wheeled) Transfers: Sit to/from Stand Sit to Stand: Min guard         General transfer comment: Cues for hand placement to and from seated surface.    Ambulation/Gait Ambulation/Gait assistance: Min assist Gait Distance (Feet): 150 Feet Assistive device: Rolling walker (2 wheeled) Gait Pattern/deviations: Step-through pattern;Decreased stride length;Trunk flexed;Wide base of support Gait velocity: decreased   General Gait Details:  cues for upright posture and proximity to RW; assist to steady when becoming fatigued   Stairs             Wheelchair Mobility    Modified Rankin (Stroke Patients Only)       Balance Overall balance assessment: Needs assistance Sitting-balance support: No upper extremity supported;Feet supported Sitting balance-Leahy Scale: Fair     Standing balance support: Bilateral upper extremity supported Standing balance-Leahy Scale: Poor                              Cognition Arousal/Alertness: Awake/alert Behavior During Therapy: WFL for tasks assessed/performed Overall Cognitive Status: Within Functional Limits for tasks assessed                                        Exercises      General Comments        Pertinent Vitals/Pain Pain Assessment: Faces Faces Pain Scale: Hurts little more Pain Location: abdomen Pain Descriptors / Indicators: Aching;Sore;Grimacing;Guarding Pain Intervention(s): Limited activity within patient's tolerance;Monitored during session;Repositioned;Premedicated before session    Home Living                      Prior Function            PT Goals (current goals can now be found in the care plan section) Acute Rehab PT Goals Patient Stated Goal: get back to independent, meet my great grandson Progress towards PT goals: Progressing toward goals    Frequency  Min 3X/week      PT Plan Current plan remains appropriate    Co-evaluation              AM-PAC PT "6 Clicks" Mobility   Outcome Measure  Help needed turning from your back to your side while in a flat bed without using bedrails?: A Lot Help needed moving from lying on your back to sitting on the side of a flat bed without using bedrails?: A Lot Help needed moving to and from a bed to a chair (including a wheelchair)?: A Little Help needed standing up from a chair using your arms (e.g., wheelchair or bedside chair)?: A  Little Help needed to walk in hospital room?: A Little Help needed climbing 3-5 steps with a railing? : A Little 6 Click Score: 16    End of Session   Activity Tolerance: Patient tolerated treatment well Patient left: in bed;with call bell/phone within reach;with bed alarm set Nurse Communication: Mobility status PT Visit Diagnosis: Muscle weakness (generalized) (M62.81)     Time: 0973-5329 PT Time Calculation (min) (ACUTE ONLY): 23 min  Charges:  $Gait Training: 8-22 mins $Therapeutic Activity: 8-22 mins                     Earney Navy, PTA Acute Rehabilitation Services Pager: [phone] Office: [phone]     Darliss Cheney 08/01/2018, 5:09 PM

## 2018-08-01 NOTE — Progress Notes (Addendum)
[place] CONSULT NOTE   Pharmacy Consult for TPN Indication: Prolonged ileus  Patient Measurements: Height: 5' 2.5" (158.8 cm) Weight: 166 lb 14.2 oz (75.7 kg) IBW/kg (Calculated) : 51.25 TPN AdjBW (KG): 54.7 Body mass index is 30.04 kg/m. Usual Weight: 63-69kg  Assessment: Rachel Park is an 85yo female admitted with hx of SBO with N/V and epigastric pain on 5/18. Later taken to OR for exploratory laparotomy and found to have necrotic bowel with resection with open abdomen followed by closure/ileocecectomy/ileocolonic anastomosis. Surgery consulted pharmacy for TPN as would like patient to have bowel rest and expects prolonged ileus.  GI: s/p bowel resection with expectation of prolonged ileus as above; 5/23 NGT came out, will allow trial without; 80 mls emesis/NGT output prior to it coming out. 3 stools 5/27.  Prealb 6.8; LBM 5/26 Endo: BG < 140s on SSI Insulin requirements in the past 24 hours: 0 units Lytes: Na 135>>133>>131 (increased in TPN), K 4.4 (goal > 4); Cor Cal 9.6 (alb 1.8); Mg 1.8 (goal > 2; will supplement), phos 3.1 Renal:SCr 0.42, UOP 0.3 ml/kg/hr + 2 unmeasured occurence Pulm: extubated on RA Cards: BP 150s/90s with HR110-120s (Atrial fibrillation on Apixaban). Hepatobil:t bili 0.5, AST/ALT WNL Trigs 103 Neuro: Pain score 3-6. Oxycodone.  ID: periop abx completed. WBC up slightly 11.6. Afebrile.   TPN Access: PICC line placed 5/19 TPN start date: 5/22 Nutritional Goals (per RD recommendation on 07/31/2018): Kcal: 5027-7412 Protein: 85-105 grams Fluid: >/= 1.5 L  Current Nutrition: TPN Full liquids - 0% intake Resource Breeze - 2 yesterday 5/27 start calorie count to see if we can start to wean TNA  Plan:  Continue TPN at goal 70 mL/hr. This TPN provides 100 g protein, 45 g lipids, and total of 1655 kcal/day; meeting 100% of patient needs Electrolytes in TPN: standard, increase Na and Mag Continue MVI, trace elements MWF  only due to shortage Continue SSI q4h -  dec to q6  Magnesium 2gm IV x1 today Monitor TPN labs Monitor po intake (full liquid diet) and calorie count  Sloan Leiter, PharmD, BCPS, BCCCP Clinical Pharmacist Please refer to [facility] for [place] numbers 08/01/2018, 7:23 AM

## 2018-08-01 NOTE — Progress Notes (Signed)
Progress Note  Patient Name: Rachel Park Date of Encounter: 08/01/2018  Primary Cardiologist: Mertie Moores, MD   Subjective   No dyspnea or CP; complains of abdominal pain  Inpatient Medications    Scheduled Meds: . acetaminophen  1,000 mg Oral Q6H  . apixaban  5 mg Oral BID  . chewing gum (ORBIT) sugar free  1 Stick Oral QID  . chlorhexidine  15 mL Mouth Rinse BID  . Chlorhexidine Gluconate Cloth  6 each Topical Daily  . diltiazem  60 mg Oral Q8H  . feeding supplement  1 Container Oral TID BM  . insulin aspart  0-9 Units Subcutaneous Q6H  . levothyroxine  75 mcg Oral QAC breakfast  . mouth rinse  15 mL Mouth Rinse q12n4p  . metoprolol tartrate  25 mg Oral BID  . pantoprazole  40 mg Oral Daily  . sodium chloride flush  10-40 mL Intracatheter Q12H   Continuous Infusions: . magnesium sulfate bolus IVPB    . TPN ADULT (ION) 70 mL/hr at 07/31/18 1734  . TPN ADULT (ION)     PRN Meds: methocarbamol, ondansetron **OR** ondansetron (ZOFRAN) IV, oxyCODONE, sodium chloride flush   Vital Signs    Vitals:   08/01/18 0350 08/01/18 0510 08/01/18 0530 08/01/18 0815  BP:   (!) 152/98 (!) 145/89  Pulse:   (!) 122 92  Resp:   (!) 29   Temp: 98.5 F (36.9 C)  98.4 F (36.9 C) 97.8 F (36.6 C)  TempSrc: Oral  Oral Oral  SpO2:   93% 92%  Weight:  75.7 kg    Height:        Intake/Output Summary (Last 24 hours) at 08/01/2018 0958 Last data filed at 08/01/2018 0300 Gross per 24 hour  Intake 1256.6 ml  Output 550 ml  Net 706.6 ml   Last 3 Weights 08/01/2018 07/31/2018 07/28/2018  Weight (lbs) 166 lb 14.2 oz 161 lb 13.1 oz 162 lb 7.7 oz  Weight (kg) 75.7 kg 73.4 kg 73.7 kg      Telemetry    Atrial fibrillation rate upper normal to mildly elevated- Personally Reviewed  Physical Exam   GEN: NAD Neck: supple Cardiac: irregular Respiratory: CTA anteriorly; no wheeze GI: s/p abdominal surgery MS: No edema Neuro:  No focal findings Psych: Normal affect   Labs     Chemistry Recent Labs  Lab 07/27/18 0336  07/29/18 0403 07/30/18 0302 07/31/18 0335 08/01/18 0335  NA 139   < > 135 135 133* 131*  K 3.7   < > 3.5 3.7 4.6 4.4  CL 114*   < > 102 102 98 96*  CO2 22   < > 25 28 26 25   GLUCOSE 155*   < > 121* 124* 120* 105*  BUN 18   < > 14 21 26* 22  CREATININE 0.43*   < > 0.32* 0.34* 0.40* 0.42*  CALCIUM 7.9*   < > 7.7* 7.9* 8.0* 7.8*  PROT 4.4*  --  4.5*  --   --  4.6*  ALBUMIN 2.0*  --  1.9*  --   --  1.8*  AST 21  --  13*  --   --  22  ALT 24  --  20  --   --  23  ALKPHOS 44  --  50  --   --  72  BILITOT 1.0  --  0.8  --   --  0.5  GFRNONAA >60   < > >60 >60 >  60 >60  GFRAA >60   < > >60 >60 >60 >60  ANIONGAP 3*   < > 8 5 9 10    < > = values in this interval not displayed.     Hematology Recent Labs  Lab 07/28/18 0322 07/29/18 0403 07/31/18 0916  WBC 9.1 10.5 11.6*  RBC 3.03* 3.20* 3.33*  HGB 9.2* 9.7* 10.1*  HCT 28.6* 29.8* 30.2*  MCV 94.4 93.1 90.7  MCH 30.4 30.3 30.3  MCHC 32.2 32.6 33.4  RDW 15.7* 15.5 15.1  PLT 199 233 353     Cardiac Studies   Echo:  04/21/18  1. The left ventricle has normal systolic function, with an ejection fraction of 55-60%. The cavity size was normal. Mild basal septal hypertrophy. Left ventricular diastology could not be evaluated secondary to atrial fibrillation. 2. The right ventricle has normal systolic function. The cavity was normal. There is no increase in right ventricular wall thickness. 3. Left atrial size was moderately dilated. 4. The mitral valve is normal in structure. 5. The tricuspid valve is normal in structure. Tricuspid valve regurgitation is mild-moderate. 6. The aortic valve is tricuspid Mild sclerosis of the aortic valve. 7. The pulmonic valve was normal in structure. Pulmonic valve regurgitation is mild by color flow Doppler. 8. Right atrial pressure is estimated at 3 mmHg.   Patient Profile     85 y.o. female with atrial fib and CAD.  We are seeing her  post op for atrial fib.    Assessment & Plan    1 Persistent atrial fibrillation-rate remains mildly elevated but likely driven by hyperadrenergic state associated with recent surgery and pain.  Continue present dose of metoprolol; change cardizem to CD. Continue apixaban. Once patient recovers from surgery the ultimate goal will be to proceed with cardioversion.    2 small bowel resection s/p ileocecetomy-per surgery  3 CAD-resume statin at DC.  4 possible liver lesion -abd ultrasound as outpt recommended.  For questions or updates, please contact [place] Please consult [email or web address] for contact info under        Signed, Kirk Ruths, MD  08/01/2018, 9:58 AM

## 2018-08-01 NOTE — Progress Notes (Signed)
Inpatient Rehabilitation-Admissions Coordinator   [facility] has just received insurance approval for CIR.   However, noted wound dehiscence today per surgery note with recommendation to hold on transfer to CIR. AC will continue to follow pt for medical readiness.   Please call if questions.   Jhonnie Garner, OTR/L  Rehab Admissions Coordinator  [phone] 08/01/2018 10:23 AM

## 2018-08-01 NOTE — Progress Notes (Signed)
PT Cancellation Note  Patient Details Name: Rachel Park MRN: [id number] DOB: [birth year]   Cancelled Treatment:    Reason Eval/Treat Not Completed: Patient declined, no reason specified Patient declined participating in PT at this time. Pt was up with OT this am and then had to return to bed for X ray and now she has increased pain. Pt requests PT return this afternoon. PT will continue to follow acutely.    Earney Navy, PTA Acute Rehabilitation Services Pager: [phone] Office: [phone]   08/01/2018, 10:03 AM

## 2018-08-01 NOTE — Progress Notes (Signed)
Patient ID: Rachel Park, female   DOB: Feb 04, 1932, 85 y.o.   MRN: [id number]    7 Days Post-Op  Subjective: Patient feels nauseated today despite still passing flatus and having BMs.  She feels bloated today as well.  Pain doesn't seem well controlled specifically at night.  Objective: Vital signs in last 24 hours: Temp:  [97.5 F (36.4 C)-98.5 F (36.9 C)] 97.8 F (36.6 C) (05/28 0815) Pulse Rate:  [92-126] 92 (05/28 0815) Resp:  [29-33] 29 (05/28 0530) BP: (129-154)/(89-98) 145/89 (05/28 0815) SpO2:  [92 %-96 %] 92 % (05/28 0815) Weight:  [75.7 kg] 75.7 kg (05/28 0510) Last BM Date: 07/30/18  Intake/Output from previous day: 05/27 0701 - 05/28 0700 In: 1256.6 [I.V.:1256.6] Out: 550 [Urine:550] Intake/Output this shift: No intake/output data recorded.  PE: Heart: irregular Lungs: CTAB Abd: soft, but more distended today than yesterday, some BS present.  Midline wound is still necrotic at the base with dehiscence today.  Still with malodorous presence.     Lab Results:  Recent Labs    07/31/18 0916  WBC 11.6*  HGB 10.1*  HCT 30.2*  PLT 353   BMET Recent Labs    07/31/18 0335 08/01/18 0335  NA 133* 131*  K 4.6 4.4  CL 98 96*  CO2 26 25  GLUCOSE 120* 105*  BUN 26* 22  CREATININE 0.40* 0.42*  CALCIUM 8.0* 7.8*   PT/INR No results for input(s): LABPROT, INR in the last 72 hours. CMP     Component Value Date/Time   NA 131 (L) 08/01/2018 0335   NA 133 (L) 02/21/2018 0817   K 4.4 08/01/2018 0335   CL 96 (L) 08/01/2018 0335   CO2 25 08/01/2018 0335   GLUCOSE 105 (H) 08/01/2018 0335   BUN 22 08/01/2018 0335   BUN 11 02/21/2018 0817   CREATININE 0.42 (L) 08/01/2018 0335   CREATININE 0.46 (L) 06/30/2015 0848   CALCIUM 7.8 (L) 08/01/2018 0335   PROT 4.6 (L) 08/01/2018 0335   PROT 6.0 02/21/2018 0817   ALBUMIN 1.8 (L) 08/01/2018 0335   ALBUMIN 4.1 02/21/2018 0817   AST 22 08/01/2018 0335   ALT 23 08/01/2018 0335   ALKPHOS 72 08/01/2018 0335   BILITOT 0.5 08/01/2018 0335   BILITOT 0.3 02/21/2018 0817   GFRNONAA >60 08/01/2018 0335   GFRAA >60 08/01/2018 0335   Lipase     Component Value Date/Time   LIPASE 27 07/22/2018 1903       Studies/Results: No results found.  Anti-infectives: Anti-infectives (From admission, onward)   Start     Dose/Rate Route Frequency Ordered Stop   07/25/18 1000  ceFAZolin (ANCEF) IVPB 2g/100 mL premix     2 g 200 mL/hr over 30 Minutes Intravenous To Short Stay 07/25/18 0749 07/25/18 1200   07/25/18 0745  ceFAZolin (ANCEF) IVPB 2g/100 mL premix  Status:  Discontinued     2 g 200 mL/hr over 30 Minutes Intravenous  Once 07/25/18 0735 07/25/18 0749   07/23/18 1000  cefoTEtan (CEFOTAN) 1 g in sodium chloride 0.9 % 100 mL IVPB     1 g 200 mL/hr over 30 Minutes Intravenous  Once 07/23/18 0825 07/23/18 1117       Assessment/Plan POD9/7elap, sbr, open abdomen followed by closure/ileocecectomy/ileocolonic anastomosis-[place] -on soft diet, but will check plain film to eval for distention/ileus.  Still moving her bowels so could be related to more gastric type ileu -[employer] -calorie count to see if we can start to wean TNA -mobilize  and cont to work with therapies.  hold tx to CIR for now -cont scheduled tylenol and prn oxy, but will increase to 5mg  and add prn robaxin, lowest dose at 500mg  TID prn to help with better pain control -midline wound is still necrotic with dehiscence today.  Cont TID dressing changes and I have discussed with therapies to minimize use of abdominal muscles when getting out of bed etc to help protect her wound.  Afib -Cardiology following -may resume eliquis  FEN - soft diet, resource breeze, calorie count, cont TNA for now, check plain film to rule out ileus given nausea VTE - Lovenox, may resume eliquis ID - none currently Follow up Rachel Park   LOS: 10 days    Henreitta Cea , [facility] Surgery 08/01/2018, 9:48 AM Pager:  [phone]

## 2018-08-01 NOTE — Evaluation (Signed)
Occupational Therapy Evaluation Patient Details Name: Rachel Park MRN: [id number] DOB: 1931-05-22 Today's Date: 08/01/2018    History of Present Illness 14 yof with SBO, failed medical management, taken to OR with findings of necrotic bowel s/p resection.  Abd left open with wound vac and returns to ICU on mechanical ventilation. Extubated 07/25/18   Clinical Impression   PTA Pt independent in ADL and mobility. Enjoys "living the simple life, and finding joy in little things" as well as her family. Today she was mod A for bed mobility with education for rolling to sidelying, ad sidelying to sitting to protect abdominal muscles and wound as much as possible. MAx A for LB dressing (donning socks) to protect wound. Short ambulation to sink with RW at min guard for transfers with O2 dropping to 82 with unclear wave form, sitting at sink with purse lip breathing she returned to 93%. Able to complete seated grooming at min guard. Pt will require skilled OT in the acute setting as well as CIR post-acute to maximize safety and independence in ADL and functional transfers. Next session to focus on AE for LB ADL, energy conservation education (initiate) and continue to stress low impact abdomen bed mobility.     Follow Up Recommendations  CIR;Supervision/Assistance - 24 hour    Equipment Recommendations  Other (comment)(defer to next venue of care)    Recommendations for Other Services       Precautions / Restrictions Precautions Precautions: Fall Precaution Comments: abdominal incision. heart pillow in room to splint incision Restrictions Weight Bearing Restrictions: No      Mobility Bed Mobility Overal bed mobility: Needs Assistance Bed Mobility: Rolling;Sidelying to Sit Rolling: Min assist Sidelying to sit: Mod assist       General bed mobility comments: PA stressed importance of NOT taxing trunk, so mod A provided, cues for sequencing log roll and sidelying to sit, use of rails to  assist  Transfers Overall transfer level: Needs assistance Equipment used: Rolling walker (2 wheeled) Transfers: Sit to/from Stand Sit to Stand: Min guard         General transfer comment: Cues for hand placement to and from seated surface.      Balance Overall balance assessment: Needs assistance Sitting-balance support: No upper extremity supported;Feet supported Sitting balance-Leahy Scale: Fair     Standing balance support: Bilateral upper extremity supported Standing balance-Leahy Scale: Poor Standing balance comment: reliant on UE and external assist                           ADL either performed or assessed with clinical judgement   ADL Overall ADL's : Needs assistance/impaired Eating/Feeding: Set up   Grooming: Wash/dry hands;Wash/dry face;Oral care;Brushing hair;Set up;Sitting Grooming Details (indicate cue type and reason): sitting at sink for energy conservation, can stand min guard for short periods Upper Body Bathing: Moderate assistance;Sitting Upper Body Bathing Details (indicate cue type and reason): for back Lower Body Bathing: Maximal assistance;Sitting/lateral leans Lower Body Bathing Details (indicate cue type and reason): due to abdominal wound, encouraged Pt NOT to bend for LB ADL Upper Body Dressing : Minimal assistance;Sitting   Lower Body Dressing: Maximal assistance;Sit to/from stand Lower Body Dressing Details (indicate cue type and reason): due to abdominal wound, encouraged Pt NOT to bend for LB ADL Toilet Transfer: Min guard;Ambulation;RW Toilet Transfer Details (indicate cue type and reason): short in room distance Toileting- Clothing Manipulation and Hygiene: Moderate assistance;Sit to/from stand  Functional mobility during ADLs: Min guard;Minimal assistance;Rolling walker General ADL Comments: will need extensive education in AE and compensatory strategies to access LB for ADL due to wound     Vision Baseline  Vision/History: Wears glasses Wears Glasses: At all times Patient Visual Report: No change from baseline Vision Assessment?: No apparent visual deficits     Perception     Praxis      Pertinent Vitals/Pain Pain Assessment: Faces Faces Pain Scale: Hurts even more Pain Location: abdomen Pain Descriptors / Indicators: Aching;Sore;Grimacing Pain Intervention(s): Monitored during session;Repositioned(very careful NOT to tax core during bed mobility)     Hand Dominance Right   Extremity/Trunk Assessment Upper Extremity Assessment Upper Extremity Assessment: Generalized weakness   Lower Extremity Assessment Lower Extremity Assessment: Generalized weakness   Cervical / Trunk Assessment Cervical / Trunk Assessment: Other exceptions Cervical / Trunk Exceptions: open abdominal wound   Communication Communication Communication: No difficulties   Cognition Arousal/Alertness: Awake/alert Behavior During Therapy: WFL for tasks assessed/performed Overall Cognitive Status: Within Functional Limits for tasks assessed                                     General Comments  with activity, O2 dropped to 82% (insuffient wave form), back up to 93% with pursed lip breathing, HR up to 116 with activity    Exercises     Shoulder Instructions      Home Living Family/patient expects to be discharged to:: Private residence Living Arrangements: Alone Available Help at Discharge: Family;Available PRN/intermittently Type of Home: House Home Access: Level entry     Home Layout: One level     Bathroom Shower/Tub: Occupational psychologist: Standard     Home Equipment: None          Prior Functioning/Environment Level of Independence: Independent                 OT Problem List: Decreased activity tolerance;Decreased range of motion;Impaired balance (sitting and/or standing);Decreased safety awareness;Decreased knowledge of use of DME or AE;Decreased  knowledge of precautions;Pain;Increased edema      OT Treatment/Interventions: Self-care/ADL training;Energy conservation;DME and/or AE instruction;Therapeutic activities;Patient/family education;Balance training    OT Goals(Current goals can be found in the care plan section) Acute Rehab OT Goals Patient Stated Goal: get back to independent, meet my great grandson OT Goal Formulation: With patient Time For Goal Achievement: 08/15/18 Potential to Achieve Goals: Good ADL Goals Pt Will Perform Grooming: with modified independence;standing;sitting Pt Will Perform Upper Body Dressing: with modified independence;sitting Pt Will Perform Lower Body Dressing: with modified independence;with adaptive equipment;sit to/from stand Pt Will Transfer to Toilet: with modified independence;ambulating Pt Will Perform Toileting - Clothing Manipulation and hygiene: with modified independence;sit to/from stand;with adaptive equipment Additional ADL Goal #1: Pt will recall 3 energy conservation strategies for ADL at independent level  OT Frequency: Min 2X/week   Barriers to D/C:            Co-evaluation              AM-PAC OT "6 Clicks" Daily Activity     Outcome Measure Help from another person eating meals?: A Little Help from another person taking care of personal grooming?: A Little Help from another person toileting, which includes using toliet, bedpan, or urinal?: A Lot Help from another person bathing (including washing, rinsing, drying)?: A Lot Help from another person to put on and taking  off regular upper body clothing?: A Little Help from another person to put on and taking off regular lower body clothing?: A Lot 6 Click Score: 15   End of Session Equipment Utilized During Treatment: Rolling walker Nurse Communication: Mobility status;Precautions  Activity Tolerance: Patient tolerated treatment well Patient left: Other (comment)(in chair at sink getting full bath with NT)  OT  Visit Diagnosis: Unsteadiness on feet (R26.81);Other abnormalities of gait and mobility (R26.89);Muscle weakness (generalized) (M62.81);Pain Pain - part of body: (abdomen)                Time: 0240-9735 OT Time Calculation (min): 25 min Charges:  OT General Charges $OT Visit: 1 Visit OT Evaluation $OT Eval Moderate Complexity: 1 Mod OT Treatments $Self Care/Home Management : 8-22 mins  Hulda Humphrey OTR/L Acute Rehabilitation Services Pager: [phone] Office: [place] 08/01/2018, 9:31 AM

## 2018-08-01 NOTE — Progress Notes (Addendum)
Nutrition Follow-up  DOCUMENTATION CODES:   Not applicable  INTERVENTION:   -D/c calorie count -Continue Boost Breeze po TID, each supplement provides 250 kcal and 9 grams of protein -RD will follow for diet advancement and supplement diet as appropriate -TPN management pert pharmacy  NUTRITION DIAGNOSIS:   Increased nutrient needs related to post-op healing as evidenced by estimated needs.  Ongoing  GOAL:   Patient will meet greater than or equal to 90% of their needs  Met with TPN  MONITOR:   PO intake, Supplement acceptance, Diet advancement, Labs, Weight trends, Skin, I & O's  REASON FOR ASSESSMENT:   Consult New TPN/TNA  ASSESSMENT:   Patient with PMH significant for CAD s/p cath, HTN, stroke, TIA, and SBO s/p hernia repair in 2018. Presents this admission small bowel obstruction with ischemic bowel.   5/19 - ex-lap resection,LOA, VAC placement for open abdomen 5/21 - s/p ileocecectomy with ileocolonic anastomosis, abdominal closure 5/22 - extubated 5/23- NGT d/c, transferred from ICU to PCU 5/26- advanced to full liquids, Ensure added BID by general surgery 5/27- advanced to soft diet   Reviewed I/O's: +707 ml x 24 hours and +8.8 L since admission  UOP: 550 ml x 24 hours  Pt downgraded to clear liquids this morning secondary to nausea. Per general surgery notes, plan to check film to evaluate for distention/ileus.   Pt remains with poor appetite, only consuming bites and sips at meals. Pt was resting quietly at time of visit and RD did not wake during visit. Observed two Boost Breeze supplements on tray table- one unopened and one about 1/3 consumed).   Calorie count completed; pt consuming only 14% of estimated nutritional needs and 4% of protein needs.   07/31/18 Breakfast: 148 kcals, 0 grams protein Lunch: 0% (missed meal) Dinner: no data recorded Supplements: consumed 1/3 of Boost Breeze supplement (83 kcals and 3 grams protein)  Total  intake: 231 kcal (14% of minimum estimated needs)  3 grams protein (4% of minimum estimated needs)  Per pharmacy note, pt continues to receive TPN at goal rate of 70 ml/hr, which provides 1634 kcals and 100 grams protein daily, meeting meeting 99% of estimated kcal and 100 % of estimated protein needs.  Per CIR admission coordinator, plan to d/c to CIR once medically stable.  Labs reviewed: Na: 131, CBGS: 110-118 (inpatient orders for glycemic control are 0-9 units insulin aspart every 6 hours).   Nutrition-Focused Physical Exam:    Most Recent Value  Orbital Region  No depletion  Upper Arm Region  No depletion  Thoracic and Lumbar Region  No depletion  Buccal Region  No depletion  Temple Region  No depletion  Clavicle Bone Region  No depletion  Clavicle and Acromion Bone Region  No depletion  Scapular Bone Region  No depletion  Dorsal Hand  No depletion  Patellar Region  No depletion  Anterior Thigh Region  No depletion  Posterior Calf Region  No depletion  Edema (RD Assessment)  None  Hair  Reviewed  Eyes  Reviewed  Mouth  Reviewed  Skin  Reviewed  Nails  Reviewed      Diet Order:   Diet Order            Diet clear liquid Room service appropriate? Yes; Fluid consistency: Thin  Diet effective now              EDUCATION NEEDS:   Not appropriate for education at this time  Skin:  Skin Assessment: Skin  Integrity Issues: Skin Integrity Issues:: Incisions Wound Vac: d/c on 07/25/18 Incisions: closed abdomen  Last BM:  08/01/18  Height:   Ht Readings from Last 1 Encounters:  07/23/18 5' 2.5" (1.588 m)    Weight:   Wt Readings from Last 1 Encounters:  08/01/18 75.7 kg    Ideal Body Weight:  52.3 kg  BMI:  Body mass index is 30.04 kg/m.  Estimated Nutritional Needs:   Kcal:  1650-1850  Protein:  85-105 grams  Fluid:  >/= 1.5 L    Kaitrin Seybold A. Jimmye Norman, RD, LDN, [place] Registered Dietitian II Certified Diabetes Care and Education  Specialist Pager: [phone] After hours Pager: [phone]

## 2018-08-01 NOTE — Progress Notes (Signed)
Consult received regarding edema to RFA. PICC line to RUA. There is no swelling or concerns with RUA, however, RFA is cool to touch, with edema noted around elbow extending through FA. Measurements 10" R mid-forearm, 11 3/4" RUA; 8 7/8" L mid-forearm, 11 1/2" LUA. Spoke with Sherlynn Stalls RN regarding R arm. Instructed her to contact MD to notify of concerns and to continue to keep elevated. VU. Fran Lowes, RN VSAT

## 2018-08-01 NOTE — Care Management Important Message (Signed)
Important Message  Patient Details  Name: Rachel Park MRN: [id number] Date of Birth: Aug 18, 1931   Medicare Important Message Given:  Yes    Shakti Fleer Montine [address] 08/01/2018, 4:03 PM

## 2018-08-01 NOTE — Progress Notes (Signed)
Family Medicine Teaching Service Daily Progress Note Intern Pager: [phone]  Patient name: Rachel Park Medical record number: [id number] Date of birth: 03/14/1931 Age: 85 y.o. Gender: female  Primary Care Provider: Lajean Manes, MD Consultants: CCM, gen surg, cardiology Code Status: full  Pt Overview and Major Events to Date:  5/18 admitted to [facility] 5/19 ex lap, underwent colonic resection 5/21 underwent ileocolic anastomosis 8/41 extubated 5/24 out to floor, care resumed by FPTS  Assessment and Plan: 85 year old female who presented with severe abdominal pain.  Found to have ischemic bowel on ex lap, bowel resected and underwent reanastomosis.  Has developed ileus postoperatively, extubated on 5/22 and transferred out of floor on 5/24.  Ischemic bowel s/p ileocolic anastomosis. Possible post op ileus  Patient had 3 BM yesterday.   Receiving TPN, managed by surgery and pharmacy.   General surgery following appreciate their recommendations.  Transitioned to PO meds. PT seeing pt, awaiting CIR placement. Received oxycodone 3 times yesterday. Wound appears to be dehiscing. Abdominal xray today showing possible ileus. -Vital signs per floor routine -Continue to ambulate with PT/OT, can receive as needed pain medication prior to ambulating -TPN, pharmacy to dose,  -Zofran PRN - increased oxy to 5mg  q4h PRN - added robaxin 500mg  TID PRN, per surgery - tylenol q6h - wet to dry dressing TID - awaiting stability for CIR placement  A. Fib  Cardiology following appreciate the recommendations.  transitioned to oral meds. HR in 120s today.   Continue to monitor electrolytes, especially in setting of TPN. - cardizem CD 180 Qdaily and metop 25mg  BID.  -apixaban 5mg  BID -Daily BMP  Insomnia - pt having difficulty sleeping, likely from combination of pain and anxiety.   - Ramelteon qhs  Tachypnea -  Pt respirations in the high 20s to 30s yesterday. Initially thought to be 2/2 pain, could  also represent PE. Is on 93% room air. Pt already on eliquis.  No swelling of any extremities noted.     Small left pleural effusion  Likely secondary to atelectasis.  No progression from last x-ray.  We will continue to monitor for fever or leukocytosis.   - incentive spirometry  Hypothyroidism -  Pt takes 61mcg synthroid at home daily.  - continue home med  Hypocalcemia Calcium 7.9>8.0>7.7>7.9  When corrected for low albumin calcium is in the normal range.  Expect albumin to improve once able to tolerate better p.o.  - Monitor with daily calcium  Normocytic anemia   Likely secondary to blood loss during surgery. Stable.  - weekly cbc  FEN/GI: N.p.o., sips with meds and ice chips allowed. PPx: Pantoprazole 40 mg, Lovenox  Disposition: likely CIR  Subjective:  Patient states she was feeling nauseous for most of the day yesterday, though she did not have vomiting.  She ate two small meals yesterday and had two bowel movements.  She says she couldn't sleep last night.    Objective: Temp:  [97.5 F (36.4 C)-98.5 F (36.9 C)] 98.4 F (36.9 C) (05/28 0530) Pulse Rate:  [101-126] 122 (05/28 0530) Resp:  [18-33] 29 (05/28 0530) BP: (120-154)/(80-98) 152/98 (05/28 0530) SpO2:  [93 %-96 %] 93 % (05/28 0530) Weight:  [75.7 kg] 75.7 kg (05/28 0510) Physical Exam: General: alert, oriented.  No acute distress Cardiovascular: irregularly irregular rhythm.  HR in the 110 range. No murmurs. No pedal edema.   Respiratory: LCTAB.  No wheezes or crackles.  Abdomen: TTP to light touch along the site of incision. Similar to yesterday's exam.  Normal bowel sounds.           Laboratory: Recent Labs  Lab 07/28/18 0322 07/29/18 0403 07/31/18 0916  WBC 9.1 10.5 11.6*  HGB 9.2* 9.7* 10.1*  HCT 28.6* 29.8* 30.2*  PLT 199 233 353   Recent Labs  Lab 07/27/18 0336  07/29/18 0403 07/30/18 0302 07/31/18 0335  NA 139   < > 135 135 133*  K 3.7   < > 3.5 3.7 4.6  CL 114*   < > 102  102 98  CO2 22   < > 25 28 26   BUN 18   < > 14 21 26*  CREATININE 0.43*   < > 0.32* 0.34* 0.40*  CALCIUM 7.9*   < > 7.7* 7.9* 8.0*  PROT 4.4*  --  4.5*  --   --   BILITOT 1.0  --  0.8  --   --   ALKPHOS 44  --  50  --   --   ALT 24  --  20  --   --   AST 21  --  13*  --   --   GLUCOSE 155*   < > 121* 124* 120*   < > = values in this interval not displayed.    Benay Pike, MD 08/01/2018, 7:21 AM PGY-1, [place] Intern pager: [phone], text pages welcome

## 2018-08-02 ENCOUNTER — Inpatient Hospital Stay (HOSPITAL_COMMUNITY): Payer: Medicare HMO

## 2018-08-02 DIAGNOSIS — I82629 Acute embolism and thrombosis of deep veins of unspecified upper extremity: Secondary | ICD-10-CM

## 2018-08-02 DIAGNOSIS — I82621 Acute embolism and thrombosis of deep veins of right upper extremity: Secondary | ICD-10-CM

## 2018-08-02 DIAGNOSIS — R609 Edema, unspecified: Secondary | ICD-10-CM

## 2018-08-02 LAB — GLUCOSE, CAPILLARY
Glucose-Capillary: 98 mg/dL (ref 70–99)
Glucose-Capillary: 89 mg/dL (ref 70–99)
Glucose-Capillary: 110 mg/dL — ABNORMAL HIGH (ref 70–99)

## 2018-08-02 LAB — BASIC METABOLIC PANEL
Anion gap: 9 (ref 5–15)
BUN: 24 mg/dL — ABNORMAL HIGH (ref 8–23)
CO2: 24 mmol/L (ref 22–32)
Calcium: 7.7 mg/dL — ABNORMAL LOW (ref 8.9–10.3)
Chloride: 96 mmol/L — ABNORMAL LOW (ref 98–111)
Creatinine, Ser: 0.34 mg/dL — ABNORMAL LOW (ref 0.44–1.00)
GFR calc Af Amer: >60 mL/min (ref >60)
GFR calc non Af Amer: >60 mL/min (ref >60)
Glucose, Bld: 113 mg/dL — ABNORMAL HIGH (ref 70–99)
Potassium: 4.8 mmol/L (ref 3.5–5.1)
Sodium: 129 mmol/L — ABNORMAL LOW (ref 135–145)

## 2018-08-02 LAB — MAGNESIUM: Magnesium: 2 mg/dL (ref 1.7–2.4)

## 2018-08-02 LAB — CBC
HCT: 28.9 % — ABNORMAL LOW (ref 36.0–46.0)
Hemoglobin: 9.3 g/dL — ABNORMAL LOW (ref 12.0–15.0)
MCH: 29.8 pg (ref 26.0–34.0)
MCHC: 32.2 g/dL (ref 30.0–36.0)
MCV: 92.6 fL (ref 80.0–100.0)
Platelets: 410 10*3/uL — ABNORMAL HIGH (ref 150–400)
RBC: 3.12 MIL/uL — ABNORMAL LOW (ref 3.87–5.11)
RDW: 15.4 % (ref 11.5–15.5)
WBC: 16.7 10*3/uL — ABNORMAL HIGH (ref 4.0–10.5)
nRBC: 0 % (ref 0.0–0.2)

## 2018-08-02 MED ORDER — PIPERACILLIN-TAZOBACTAM 3.375 G IVPB
3.3750 g | Freq: Three times a day (TID) | INTRAVENOUS | Status: AC
  Administered 2018-08-02 – 2018-08-06 (×14): 3.375 g via INTRAVENOUS
  Filled 2018-08-02 (×15): qty 50

## 2018-08-02 MED ORDER — TRAVASOL 10 % IV SOLN
INTRAVENOUS | Status: AC
  Administered 2018-08-02: 17:00:00 via INTRAVENOUS
  Filled 2018-08-02: qty 1008

## 2018-08-02 MED ORDER — IOHEXOL 300 MG/ML  SOLN: 100.0000 mL | Freq: Once | INTRAMUSCULAR | Status: DC

## 2018-08-02 MED ORDER — IOHEXOL 300 MG/ML  SOLN
100.0000 mL | Freq: Once | INTRAMUSCULAR | Status: AC | PRN
  Administered 2018-08-02: 100 mL via INTRAVENOUS

## 2018-08-02 MED ORDER — FUROSEMIDE 10 MG/ML IJ SOLN
20.0000 mg | Freq: Once | INTRAMUSCULAR | Status: AC
  Administered 2018-08-02: 20 mg via INTRAVENOUS
  Filled 2018-08-02: qty 2

## 2018-08-02 NOTE — Consult Note (Addendum)
Hospital Consult    Reason for Consult:  RUE DVT Requesting Physician:  Criss Rosales MRN #:  [id number]  History of Present Illness: This is a 85 y.o. female who was admitted ~ 10 days ago and taken to the operating room and underwent laparotomy with ileocectomy with ileiocolonic anastomosis on 07/25/2018.   She has a hx of afib and is currently on Xarelto.  She had a CT of abdomen/pelvis today, which revealed some dilated small bowel and somme intra-abdominal fluid collections that are too small to drain and collection in abdominal wall inferior to the wound.  She is receiving TPN.  Her midline wound is necrotic with dehiscence and some visible bowel and undergoing tid dressing changes.  She is being watched closely for EC fistula.    She did have a RUE venous duplex today, which revealed findings consistent with acute DVT involving the right IJ and right brachial vein.  There are findings consistent with superficial vein thrombosis involving the right basilic vein.   Vascular surgery is consulted for this.     Past Medical History:  Diagnosis Date  . Arthritis   . Complication of anesthesia   . Coronary artery disease   . Hypertension   . Hypothyroidism   . PAF (paroxysmal atrial fibrillation) ([place])    on Xarelto for maybe a year, opted to stop  . PONV (postoperative nausea and vomiting)   . Renal infarction ([place])   . Small bowel obstruction ([place]) 06/2015  . Stroke [facility])    oct 2016  . TIA (transient ischemic attack) 03/24/2015  . Vertigo, benign positional    to be evaluated, intermittent    Past Surgical History:  Procedure Laterality Date  . ABDOMINAL HYSTERECTOMY    . APPLICATION OF WOUND VAC N/A 07/23/2018   Procedure: APPLICATION OF WOUND VAC;  Surgeon: Rolm Bookbinder, MD;  Location: [place];  Service: General;  Laterality: N/A;  . BOWEL RESECTION N/A 07/23/2018   Procedure: SMALL BOWEL RESECTION;  Surgeon: Rolm Bookbinder, MD;  Location: [place];  Service: General;   Laterality: N/A;  . CARDIAC CATHETERIZATION N/A 12/24/2014   Procedure: Left Heart Cath and Coronary Angiography;  Surgeon: Sherren Mocha, MD;  Location: [place] CV LAB;  Service: Cardiovascular;  Laterality: N/A;  . CARDIOVERSION N/A 05/22/2018   Procedure: CARDIOVERSION;  Surgeon: Josue Hector, MD;  Location: [facility] ENDOSCOPY;  Service: Cardiovascular;  Laterality: N/A;  . CATARACT EXTRACTION Bilateral   . COLONOSCOPY WITH PROPOFOL N/A 12/01/2013   Procedure: COLONOSCOPY WITH PROPOFOL;  Surgeon: Garlan Fair, MD;  Location: [facility] ENDOSCOPY;  Service: Endoscopy;  Laterality: N/A;  . COMPLEX WOUND CLOSURE  07/25/2018   Procedure: Complex Wound Closure;  Surgeon: Rolm Bookbinder, MD;  Location: [place];  Service: General;;  . Pennie Rushing  07/25/2018   Procedure: Ileocecetomy;  Surgeon: Rolm Bookbinder, MD;  Location: [place];  Service: General;;  . INCISIONAL HERNIA REPAIR N/A 03/10/2016   Procedure: INCISIONAL HERNIA REPAIR TIMES TWO;  Surgeon: Armandina Gemma, MD;  Location: [place];  Service: General;  Laterality: N/A;  . INGUINAL HERNIA REPAIR Bilateral 03/10/2016   Procedure: BILATERAL INGUINAL HERNIA REPAIRS;  Surgeon: Armandina Gemma, MD;  Location: [place];  Service: General;  Laterality: Bilateral;  . INSERTION OF MESH N/A 03/10/2016   Procedure: INSERTION OF MESH TO BILATERAL GROINS AND ABDOMEN;  Surgeon: Armandina Gemma, MD;  Location: [place];  Service: General;  Laterality: N/A;  . laparotomy  06/2015  . LAPAROTOMY N/A 07/23/2018   Procedure: EXPLORATORY LAPAROTOMY;  Surgeon: Rolm Bookbinder, MD;  Location: [place];  Service: General;  Laterality: N/A;  . LEFT HEART CATH AND CORONARY ANGIOGRAPHY N/A 09/27/2016   Procedure: Left Heart Cath and Coronary Angiography;  Surgeon: Sherren Mocha, MD;  Location: [place] CV LAB;  Service: Cardiovascular;  Laterality: N/A;  . TONSILLECTOMY      Allergies  Allergen Reactions  . Anesthesia S-I-60 Nausea And Vomiting    "with hysterectomy years back had  post op N/V"    Prior to Admission medications   Medication Sig Start Date End Date Taking? Authorizing Provider  acetaminophen (TYLENOL) 500 MG tablet Take 500 mg by mouth every 6 (six) hours as needed for mild pain.    Yes [provider]  amiodarone (PACERONE) 200 MG tablet Take 1 tablet (200 mg total) twice daily for one month.  Then reduce and take 1 tablet daily 06/24/18  Yes Nahser, Wonda Cheng, MD  apixaban (ELIQUIS) 5 MG TABS tablet Take 1 tablet (5 mg total) by mouth 2 (two) times daily. 06/24/18  Yes Nahser, Wonda Cheng, MD  CALCIUM PO Take 1 tablet by mouth daily.   Yes [provider]  cholecalciferol (VITAMIN D) 1000 UNITS tablet Take 2,000 Units by mouth daily with lunch.    Yes [provider]  diltiazem (CARDIZEM CD) 180 MG 24 hr capsule Take 1 capsule (180 mg total) by mouth daily. 06/24/18  Yes Nahser, Wonda Cheng, MD  levothyroxine (SYNTHROID, LEVOTHROID) 75 MCG tablet Take 75 mcg by mouth daily before breakfast.   Yes [provider]  metoprolol tartrate (LOPRESSOR) 25 MG tablet Take 1 tablet (25 mg total) by mouth 2 (two) times daily. 06/24/18  Yes Nahser, Wonda Cheng, MD  NITROSTAT 0.4 MG SL tablet Place 1 tablet (0.4 mg total) under the tongue every 5 (five) minutes as needed. Chest pain 06/24/18  Yes Nahser, Wonda Cheng, MD  Polyethyl Glycol-Propyl Glycol (SYSTANE ULTRA) 0.4-0.3 % SOLN Place 1-2 drops into both eyes 3 (three) times daily as needed (dry eyes).   Yes [provider]  raNITIdine HCl (ZANTAC PO) Take 1 tablet by mouth daily as needed (heartbrun).   Yes [provider]  rosuvastatin (CRESTOR) 10 MG tablet Take 1 tablet (10 mg total) by mouth daily. 06/24/18  Yes Nahser, Wonda Cheng, MD    Social History   Socioeconomic History  . Marital status: Divorced    Spouse name: Not on file  . Number of children: 2  . Years of education: HS  . Highest education level: Not on file  Occupational History  . Occupation: retired   Scientific laboratory technician  . Financial resource strain: Not on file  . Food insecurity:    Worry: Not on file    Inability: Not on file  . Transportation needs:    Medical: Not on file    Non-medical: Not on file  Tobacco Use  . Smoking status: Never Smoker  . Smokeless tobacco: Never Used  Substance and Sexual Activity  . Alcohol use: Not Currently    Comment: occ.  . Drug use: No  . Sexual activity: Not on file  Lifestyle  . Physical activity:    Days per week: Not on file    Minutes per session: Not on file  . Stress: Not on file  Relationships  . Social connections:    Talks on phone: Not on file    Gets together: Not on file    Attends religious service: Not on file    Active  member of club or organization: Not on file    Attends meetings of clubs or organizations: Not on file    Relationship status: Not on file  . Intimate partner violence:    Fear of current or ex partner: Not on file    Emotionally abused: Not on file    Physically abused: Not on file    Forced sexual activity: Not on file  Other Topics Concern  . Not on file  Social History Narrative   Lives alone.  Widow.  Two children     Family History  Problem Relation Age of Onset  . Heart attack Father 29       Died age 7  . Stroke Mother   . Cancer Sister        breast    ROS: [x]  Positive   [ ]  Negative   [ ]  All sytems reviewed and are negative  Cardiac: [x]  afib on Xarelto  Vascular: [x]  hx of DVT  Pulmonary: []  productive cough []  asthma/wheezing []  home O2  Neurologic: [x]  hx of CVA []  mini stroke [x]  hx vertigo  Hematologic: []  hx of cancer []  bleeding problems []  problems with blood clotting easily  Endocrine:  [x]  thyroid disease  GI [x]  small bowel obstruction  GU: []  CKD/renal failure []  HD--[]  M/W/F or []  T/T/S  Psychiatric: []  anxiety []  depression  Musculoskeletal: [x]  arthritis  Integumentary: [x]  bruising right arm   Constitutional: []  fever []  chills    Physical Examination  Vitals:   08/02/18 0807 08/02/18 0843  BP: 127/86   Pulse: (!) 106   Resp: (!) 23   Temp:  97.9 F (36.6 C)  SpO2: 92%    Body mass index is 29.92 kg/m.  General:  WDWN in NAD Gait: Not observed HENT: WNL, normocephalic Pulmonary: normal non-labored breathing, without Rales, rhonchi,  wheezing Cardiac: irregular Abdomen: distended; bandage in place Skin: bruising right arm Vascular Exam/Pulses: +palpable bilateral DP pulses +palpable right radial pulse Extremities:  PICC line in place right arm with ecchymosis; mild swelling right arm.  Musculoskeletal: no muscle wasting or atrophy  Neurologic: A&O X 3;  No focal weakness or paresthesias are detected; speech is fluent/normal Psychiatric:  The pt states she feels discouraged  CBC    Component Value Date/Time   WBC 16.7 (H) 08/02/2018 0359   RBC 3.12 (L) 08/02/2018 0359   HGB 9.3 (L) 08/02/2018 0359   HGB 13.4 09/20/2016 1043   HCT 28.9 (L) 08/02/2018 0359   HCT 38.5 09/20/2016 1043   PLT 410 (H) 08/02/2018 0359   PLT 263 09/20/2016 1043   MCV 92.6 08/02/2018 0359   MCV 92 09/20/2016 1043   MCH 29.8 08/02/2018 0359   MCHC 32.2 08/02/2018 0359   RDW 15.4 08/02/2018 0359   RDW 14.2 09/20/2016 1043   LYMPHSABS 0.8 07/29/2018 0403   MONOABS 1.0 07/29/2018 0403   EOSABS 0.2 07/29/2018 0403   BASOSABS 0.0 07/29/2018 0403    BMET    Component Value Date/Time   NA 129 (L) 08/02/2018 0359   NA 133 (L) 02/21/2018 0817   K 4.8 08/02/2018 0359   CL 96 (L) 08/02/2018 0359   CO2 24 08/02/2018 0359   GLUCOSE 113 (H) 08/02/2018 0359   BUN 24 (H) 08/02/2018 0359   BUN 11 02/21/2018 0817   CREATININE 0.34 (L) 08/02/2018 0359   CREATININE 0.46 (L) 06/30/2015 0848   CALCIUM 7.7 (L) 08/02/2018 0359   GFRNONAA >60 08/02/2018 0359   GFRAA >60  08/02/2018 0359    COAGS: Lab Results  Component Value Date   INR 1.1 07/23/2018   INR 0.9 09/20/2016   INR 0.97 12/24/2014     Non-Invasive Vascular  Imaging:   RUE venous duplex 08/02/2018: Right: Findings consistent with acute deep vein thrombosis involving the right internal jugular veins and right brachial veins. Findings consistent with acute superficial vein thrombosis involving the right basilic vein.   Left: No evidence of thrombosis in the subclavian.   ASSESSMENT/PLAN: This is a 85 y.o. female with recent small bowel resection and complicated by wound dehiscence now with RUE DVT  -pt with afib and is currently on Xarelto.  Anticoagulation is the treatment for this RUE DVT & given she is on Xarelto, would continue this.  Nothing further to do from vascular standpoint.     Leontine Locket, PA-C [facility] [phone]  I have seen and evaluated the patient. I agree with the PA note as documented above. Very pleasant 85 yo F with complex abdominal hx and underwent ex-lap and SBR with open abdomen and initially sick in ICU.  Now in continuity and on floor.  Likely incidental discovery of catheter associated DVT in right IJ.  She did have a RIJ catheter as noted on CXR from 5/19 with tip in likely right subclavian vein.  No previous hx of blood clot per Ms. Bratton.  Already on anticoagulation for afib and Eliquis restarted which should appropriately cover IJ DVT.  Current chest guidelines state can leave catheter in place if on anticoagulation and PICC can stay in place given use for TPN.  Right arm not symptomatic.  If issues can elevate and gentle compression.  Call vascular with questions or concerns.    Marty Heck, MD [facility] [place] Office: [phone] Pager: [phone]

## 2018-08-02 NOTE — Progress Notes (Deleted)
Error

## 2018-08-02 NOTE — Progress Notes (Signed)
Family Medicine Teaching Service Daily Progress Note Intern Pager: [phone]  Patient name: Rachel Park Medical record number: [id number] Date of birth: [birth year] Age: 85 y.o. Gender: female  Primary Care Provider: Lajean Manes, MD Consultants: CCM, gen surg, cardiology Code Status: full  Pt Overview and Major Events to Date:  5/18 admitted to [facility] 5/19 ex lap, underwent colonic resection 5/21 underwent ileocolic anastomosis 3/24 extubated 5/24 out to floor, care resumed by FPTS 5/28 - worsening dehiscence of wound 5/29 - CT abd/CXR  Assessment and Plan: 85 year old female who presented with severe abdominal pain.  Found to have ischemic bowel on ex lap, bowel resected and underwent reanastomosis.  Has developed ileus postoperatively, extubated on 5/22 and transferred out of floor on 5/24.  Ischemic bowel s/p ileocolic anastomosis. Possible post op ileus  Patient had no recorded BM yesterday but did have one this morning.Marland Kitchen   Receiving TPN, managed by surgery and pharmacy.   General surgery following appreciate their recommendations.    Received oxycodone 2 times yesterday. Wound appears to be dehiscing, no surgical intervention recommended at the moment. Abdominal xray today showing possible ileus, and patient was put on full liquid diet yesterday. . - CXR today to check for infection/pleural effusion - CT abd, per surgery.   -Vital signs per floor routine -Continue to ambulate with PT/OT, can receive as needed pain medication prior to ambulating -TPN, pharmacy to dose, currently 91ml/hr -Zofran PRN - increased oxy to 5mg  q4h PRN - robaxin 500mg  TID PRN, per surgery - tylenol q6h - wet to dry dressing TID - awaiting stability for CIR placement - PT/OT - CIR, rolling walker  A. Fib  Cardiology following appreciate the recommendations.  transitioned to oral meds. HR < 100 most recently Cards states the recent  elevation likely d/t recent surgery and pain.  Continue to monitor  electrolytes, especially in setting of TPN. - cardizem CD 180 Qdaily and metop 25mg  BID.  -apixaban 5mg  BID -Daily BMP - eventual cardioversion after recovers from surgery.   Right arm swelling - same arm as PICC.  Noted overnight. resolved this morning. Likely from IV.  D/c'd the ultrasound.   Insomnia - pt having difficulty sleeping, likely from combination of pain and anxiety.   - Ramelteon qhs  Tachypnea -  Pt respirations in the high 20s yesterday. Improving this morning.  Initially thought to be 2/2 pain, could also represent PE. Is on 93% room air. Pt already on eliquis.  No swelling of any extremities noted.     Small left pleural effusion  Likely secondary to atelectasis.  No progression from last x-ray.  We will continue to monitor for fever or leukocytosis.   - incentive spirometry  Hypothyroidism -  Pt takes 28mcg synthroid at home daily.  - continue home med  Hypocalcemia Calcium 7.9>8.0>7.7>7.9  When corrected for low albumin calcium is in the normal range.  Expect albumin to improve once able to tolerate better p.o.  - Monitor with daily calcium  Normocytic anemia   Likely secondary to blood loss during surgery. Stable.  - weekly cbc  FEN/GI: N.p.o., sips with meds and ice chips allowed. PPx: Pantoprazole 40 mg, Lovenox  Disposition: likely CIR  Subjective:  Pt saying she is having a 'little pain' and was reminded she can take her pain medication every 4 hours.  She said she had some SOB last night, but this improved with better positioning when she sat in her chair.     Objective: Temp:  [97.7  F (36.5 C)-98.1 F (36.7 C)] 97.9 F (36.6 C) (05/29 0541) Pulse Rate:  [59-101] 95 (05/29 0541) Resp:  [17-27] 19 (05/29 0541) BP: (92-145)/(60-90) 125/90 (05/29 0541) SpO2:  [87 %-94 %] 93 % (05/29 0541) Physical Exam: General: alert, oriented.  No acute distress  Cardiovascular: irregularly irregular rhythm.  HR < 100.  No murmurs. No pedal edema.    Respiratory: lower right lobe crackles. Abdomen: TTP to light touch along the site of incision. Similar to previous exams.  Normal bowel sounds.    pic from 5/28       Laboratory: Recent Labs  Lab 07/29/18 0403 07/31/18 0916 08/02/18 0359  WBC 10.5 11.6* 16.7*  HGB 9.7* 10.1* 9.3*  HCT 29.8* 30.2* 28.9*  PLT 233 353 410*   Recent Labs  Lab 07/27/18 0336  07/29/18 0403 07/30/18 0302 07/31/18 0335 08/01/18 0335  NA 139   < > 135 135 133* 131*  K 3.7   < > 3.5 3.7 4.6 4.4  CL 114*   < > 102 102 98 96*  CO2 22   < > 25 28 26 25   BUN 18   < > 14 21 26* 22  CREATININE 0.43*   < > 0.32* 0.34* 0.40* 0.42*  CALCIUM 7.9*   < > 7.7* 7.9* 8.0* 7.8*  PROT 4.4*  --  4.5*  --   --  4.6*  BILITOT 1.0  --  0.8  --   --  0.5  ALKPHOS 44  --  50  --   --  72  ALT 24  --  20  --   --  23  AST 21  --  13*  --   --  22  GLUCOSE 155*   < > 121* 124* 120* 105*   < > = values in this interval not displayed.    Benay Pike, MD 08/02/2018, 6:30 AM PGY-1, [place] Intern pager: [phone], text pages welcome

## 2018-08-02 NOTE — Progress Notes (Signed)
FPTS Interim Progress Note   Patient seen and examined at bedside as [place] still pending and nursing had reported improvement in RUE edema and redness.  Patient denies any tightness or discomfort in the area.    Physical Exam: RUE: 0-trace edema of RFA, hematoma of right hand, 2+ radial pulse, sensation intact, hematoma somewhat cool to touch, otherwise forearm and fingers seem warm, 3x4cm area of redness and surrounding petechiae on dorsomedial aspect of right forearm.  A/P:  Patient's RUE [place] still ordered and not yest completed.  Suspect hematoma formed 2/2 microtrauma of patient's blood vessel.  Reassured as it is now improved, she remains neurovascularly intact.  She has also been back on Eliquis 2 days.  Will continue to monitor and plan to get RUE [place], although DVT is low on differential.  Will defer cancellation to day team.   [place] Constable, D.O.  PGY-1 Family Medicine  08/02/2018 6:48 AM

## 2018-08-02 NOTE — Progress Notes (Signed)
Pharmacy Antibiotic Note  Rachel Park is a 85 y.o. female admitted on 07/22/2018 with Intra-abdominal infection.  Pharmacy has been consulted for zosyn dosing.  Plan: Zosyn 3.375gm IV q8h (EI) Monitor for clinical course, LOT, and deescalation.   Height: 5' 2.5" (158.8 cm) Weight: 166 lb 3.6 oz (75.4 kg) IBW/kg (Calculated) : 51.25  Temp (24hrs), Avg:97.9 F (36.6 C), Min:97.7 F (36.5 C), Max:98.1 F (36.7 C)  Recent Labs  Lab 07/27/18 0336 07/28/18 0322 07/29/18 0403 07/30/18 0302 07/31/18 0335 07/31/18 0916 08/01/18 0335 08/02/18 0359  WBC 8.8 9.1 10.5  --   --  11.6*  --  16.7*  CREATININE 0.43* 0.39* 0.32* 0.34* 0.40*  --  0.42* 0.34*    Estimated Creatinine Clearance: 50.3 mL/min (A) (by C-G formula based on SCr of 0.34 mg/dL (L)).    Allergies  Allergen Reactions  . Anesthesia S-I-60 Nausea And Vomiting    "with hysterectomy years back had post op N/V"     Rachel Park A. Levada Dy, PharmD, [place] Please utilize Amion for appropriate phone number to reach the unit pharmacist ([place])   08/02/2018 3:52 PM

## 2018-08-02 NOTE — Progress Notes (Addendum)
Right upper extremity venous duplex completed. Refer to "CV Proc" under chart review to view preliminary results.  Preliminary results discussed with Primitivo Gauze, RN.  08/02/2018 10:34 AM Maudry Mayhew, MHA, RVT, RDCS, RDMS

## 2018-08-02 NOTE — Progress Notes (Signed)
Patient's abdominal incision dressing changed - mepitel added to wound base with wet to dry gauze. Abdominal binder placed at this time. Re-educated on importance of minimizing use of abdominal muscles while standing. Per patient, scheduled tylenol at 1800 will be sufficient for pain management at this time. Denies any other complaints. Will continue to monitor.   Hiram Comber, RN 08/02/2018 5:20 PM

## 2018-08-02 NOTE — Progress Notes (Addendum)
Paged regarding swelling of RFA.  Nurse reports IV team has seen and noted PICC in RUA flushes and no concerns.  Did have some swelling in RFA that has now improved with elevation.  Nurse notes that forearm now with only mild erythema.  Patient does not report discomfort.  Nurse reports right radial pulse intact and arm is not warm to palpation.  VSS, no fever, therefore doubt fever.  Given that patient has had improvement with elevation, will continue to elevate.  Patient currently on Eliquis 5mg  BID.  Given that PICC is flushing and without concerns per IV team, is patient's only access, and is overnight, will leave in place at this time.  Per chart review, PICC has been in place for 9 days.  Will order RUE [place] to rule out PICC-associated DVT.

## 2018-08-02 NOTE — Progress Notes (Signed)
Progress Note  Patient Name: Rachel Park Date of Encounter: 08/02/2018  Primary Cardiologist: Mertie Moores, MD   Subjective   Denies CP; mild dyspnea  Inpatient Medications    Scheduled Meds: . acetaminophen  1,000 mg Oral Q6H  . apixaban  5 mg Oral BID  . chewing gum (ORBIT) sugar free  1 Stick Oral QID  . chlorhexidine  15 mL Mouth Rinse BID  . Chlorhexidine Gluconate Cloth  6 each Topical Daily  . diltiazem  180 mg Oral Daily  . feeding supplement  1 Container Oral TID BM  . insulin aspart  0-9 Units Subcutaneous Q6H  . levothyroxine  75 mcg Oral QAC breakfast  . mouth rinse  15 mL Mouth Rinse q12n4p  . metoprolol tartrate  25 mg Oral BID  . pantoprazole  40 mg Oral Daily  . ramelteon  8 mg Oral QHS  . sodium chloride flush  10-40 mL Intracatheter Q12H   Continuous Infusions: . TPN ADULT (ION) 70 mL/hr at 08/01/18 1807   PRN Meds: methocarbamol, ondansetron **OR** ondansetron (ZOFRAN) IV, oxyCODONE, sodium chloride flush   Vital Signs    Vitals:   08/02/18 0036 08/02/18 0541 08/02/18 0703 08/02/18 0807  BP: 125/83 125/90  127/86  Pulse: 88 95  (!) 106  Resp: (!) 27 19  (!) 23  Temp: 97.7 F (36.5 C) 97.9 F (36.6 C)    TempSrc:      SpO2: (!) 87% 93%  92%  Weight:   75.4 kg   Height:        Intake/Output Summary (Last 24 hours) at 08/02/2018 0826 Last data filed at 08/02/2018 0300 Gross per 24 hour  Intake 618.42 ml  Output -  Net 618.42 ml   Last 3 Weights 08/02/2018 08/01/2018 07/31/2018  Weight (lbs) 166 lb 3.6 oz 166 lb 14.2 oz 161 lb 13.1 oz  Weight (kg) 75.4 kg 75.7 kg 73.4 kg      Telemetry    Atrial fibrillation rate controlled- Personally Reviewed  Physical Exam   GEN: NAD, WD Neck: supple, no JVD Cardiac: irregular, normal rate Respiratory: CTA GI: s/p abdominal surgery MS: Trace edema Neuro:  Grossly intact  Labs    Chemistry Recent Labs  Lab 07/27/18 0336  07/29/18 0403  07/31/18 0335 08/01/18 0335 08/02/18 0359  NA  139   < > 135   < > 133* 131* 129*  K 3.7   < > 3.5   < > 4.6 4.4 4.8  CL 114*   < > 102   < > 98 96* 96*  CO2 22   < > 25   < > 26 25 24   GLUCOSE 155*   < > 121*   < > 120* 105* 113*  BUN 18   < > 14   < > 26* 22 24*  CREATININE 0.43*   < > 0.32*   < > 0.40* 0.42* 0.34*  CALCIUM 7.9*   < > 7.7*   < > 8.0* 7.8* 7.7*  PROT 4.4*  --  4.5*  --   --  4.6*  --   ALBUMIN 2.0*  --  1.9*  --   --  1.8*  --   AST 21  --  13*  --   --  22  --   ALT 24  --  20  --   --  23  --   ALKPHOS 44  --  50  --   --  72  --  BILITOT 1.0  --  0.8  --   --  0.5  --   GFRNONAA >60   < > >60   < > >60 >60 >60  GFRAA >60   < > >60   < > >60 >60 >60  ANIONGAP 3*   < > 8   < > 9 10 9    < > = values in this interval not displayed.     Hematology Recent Labs  Lab 07/29/18 0403 07/31/18 0916 08/02/18 0359  WBC 10.5 11.6* 16.7*  RBC 3.20* 3.33* 3.12*  HGB 9.7* 10.1* 9.3*  HCT 29.8* 30.2* 28.9*  MCV 93.1 90.7 92.6  MCH 30.3 30.3 29.8  MCHC 32.6 33.4 32.2  RDW 15.5 15.1 15.4  PLT 233 353 410*     Cardiac Studies   Echo:  04/21/18  1. The left ventricle has normal systolic function, with an ejection fraction of 55-60%. The cavity size was normal. Mild basal septal hypertrophy. Left ventricular diastology could not be evaluated secondary to atrial fibrillation. 2. The right ventricle has normal systolic function. The cavity was normal. There is no increase in right ventricular wall thickness. 3. Left atrial size was moderately dilated. 4. The mitral valve is normal in structure. 5. The tricuspid valve is normal in structure. Tricuspid valve regurgitation is mild-moderate. 6. The aortic valve is tricuspid Mild sclerosis of the aortic valve. 7. The pulmonic valve was normal in structure. Pulmonic valve regurgitation is mild by color flow Doppler. 8. Right atrial pressure is estimated at 3 mmHg.   Patient Profile     85 y.o. female with atrial fib and CAD.  We are seeing her post op for  atrial fib.    Assessment & Plan    1 Persistent atrial fibrillation-patient's heart rate is now controlled.  Continue present dose of Cardizem and metoprolol.  Continue apixaban.  Once patient recovers from surgery the ultimate goal will be to proceed with cardioversion.    2 small bowel resection s/p ileocecetomy-per surgery  3 CAD-resume statin at DC.  4 possible liver lesion -abd ultrasound as outpt recommended.  5 acute diastolic congestive heart failure-mild volume excess on examination this morning.  We will give Lasix 20 mg IV x1 and follow.  For questions or updates, please contact [place] Please consult [email or web address] for contact info under        Signed, Kirk Ruths, MD  08/02/2018, 8:26 AM

## 2018-08-02 NOTE — Progress Notes (Signed)
PHARMACY - ADULT TOTAL PARENTERAL NUTRITION CONSULT NOTE   Pharmacy Consult for TPN Indication: Prolonged ileus  Patient Measurements: Height: 5' 2.5" (158.8 cm) Weight: 166 lb 3.6 oz (75.4 kg) IBW/kg (Calculated) : 51.25 TPN AdjBW (KG): 54.7 Body mass index is 29.92 kg/m. Usual Weight: 63-69kg  Assessment: Rachel Park is an 85yo female admitted with hx of SBO with N/V and epigastric pain on 5/18. Later taken to OR for exploratory laparotomy and found to have necrotic bowel with resection with open abdomen followed by closure/ileocecectomy/ileocolonic anastomosis. Surgery consulted pharmacy for TPN as would like patient to have bowel rest and expects prolonged ileus.  GI: s/p bowel resection with expectation of prolonged ileus as above; 5/23 NGT out. 3 stools 5/27.  Prealb 6.8; LBM 5/28 Endo: BG < 140s on SSI Insulin requirements in the past 24 hours: 1 units Lytes: Na down 129 (increased in TPN), K up 4.8 (goal > 4); Cor Cal 10.1 (alb 1.8); Mg 2 (goal > 2; post supplement), phos 3.1 Renal:SCr 0.34, UOP 0.3 ml/kg/hr + 2 unmeasured occurence Pulm: extubated on RA Cards: BP 120s/90s (improved) with HR 80-90s (Atrial fibrillation on Apixaban). Hepatobil:t bili 0.5, AST/ALT WNL Trigs 103 Neuro: Pain score 2-7. Oxycodone.  ID: periop abx completed. WBC up slightly 16.7. Afebrile.   TPN Access: PICC line placed 5/19 TPN start date: 5/22 Nutritional Goals (per RD recommendation on 08/01/2018): Kcal: 3785-8850 Protein: 85-105 grams Fluid: >/= 1.5 L  Current Nutrition: TPN Full liquids - 14% nutritional needs with only 4% protein needs [employer] - 2 yesterday 5/27 start calorie count to see if we can start to wean TNA  Plan:  Continue TPN at goal 70 mL/hr. This TPN provides 100 g protein, 45 g lipids, and total of 1655 kcal/day; meeting 100% of patient needs Electrolytes in TPN: increase Na further, continue increased Mag, decrease K.  Continue MVI, trace elements MWF only due  to shortage Continue SSI q4h -  dec to q6  Monitor TPN labs Monitor po intake (full liquid diet) and calorie count  Sloan Leiter, PharmD, BCPS, BCCCP Clinical Pharmacist Please refer to [facility] for [place] numbers 08/02/2018, 8:30 AM

## 2018-08-02 NOTE — Progress Notes (Signed)
Received page from attending that ultrasound had resulted.  Reviewed findings which were right-sided IJ and brachial DVT, along with superficial thrombosis of basilic vein.  This was ordered due to complaint of right arm swelling that was relayed by overnight team, patient was clinically improved by a.m. rounds with no distal sensation or perfusion deficits.  Patient has been on Eliquis since the 27th so is unsure what further intervention could be done and is clinically asymptomatic patient who is significant other complications going on at the time after recent surgery.  Consulted vascular surgeon for further evaluation, also notified son  -Dr. Criss Rosales

## 2018-08-02 NOTE — Progress Notes (Signed)
[facility] Progress Note  8 Days Post-Op  Subjective: CC: abdominal pain Patient still having some abdominal pain but feels that pain control has improved some since yesterday. Feels less nauseated this AM and had a BM earlier.   Her son, Dr. Donnetta Hutching, was at the bedside this AM.   Objective: Vital signs in last 24 hours: Temp:  [97.7 F (36.5 C)-98.1 F (36.7 C)] 97.9 F (36.6 C) (05/29 0843) Pulse Rate:  [59-106] 106 (05/29 0807) Resp:  [17-27] 23 (05/29 0807) BP: (92-140)/(60-90) 127/86 (05/29 0807) SpO2:  [87 %-94 %] 92 % (05/29 0807) Weight:  [75.4 kg] 75.4 kg (05/29 0703) Last BM Date: 08/01/18  Intake/Output from previous day: 05/28 0701 - 05/29 0700 In: 618.4 [I.V.:618.4] Out: -  Intake/Output this shift: No intake/output data recorded.  PE: Gen:  Alert, NAD, pleasant Card:  Regular rate and rhythm, 1+ LE edema bilaterally  Pulm:  Normal effort Abd: Soft, appropriately tender, mildly distended; wound dehisced with necrotic tissue in wound base with visible bowel and audible peristalsis but no obvious fistula at this time, malodorous; Small pocket of brown serous fluid evacuated from inferior wound on the left  Psych: A&Ox3   Lab Results:  Recent Labs    07/31/18 0916 08/02/18 0359  WBC 11.6* 16.7*  HGB 10.1* 9.3*  HCT 30.2* 28.9*  PLT 353 410*   BMET Recent Labs    08/01/18 0335 08/02/18 0359  NA 131* 129*  K 4.4 4.8  CL 96* 96*  CO2 25 24  GLUCOSE 105* 113*  BUN 22 24*  CREATININE 0.42* 0.34*  CALCIUM 7.8* 7.7*   PT/INR No results for input(s): LABPROT, INR in the last 72 hours. CMP     Component Value Date/Time   NA 129 (L) 08/02/2018 0359   NA 133 (L) 02/21/2018 0817   K 4.8 08/02/2018 0359   CL 96 (L) 08/02/2018 0359   CO2 24 08/02/2018 0359   GLUCOSE 113 (H) 08/02/2018 0359   BUN 24 (H) 08/02/2018 0359   BUN 11 02/21/2018 0817   CREATININE 0.34 (L) 08/02/2018 0359   CREATININE 0.46 (L) 06/30/2015 0848   CALCIUM 7.7  (L) 08/02/2018 0359   PROT 4.6 (L) 08/01/2018 0335   PROT 6.0 02/21/2018 0817   ALBUMIN 1.8 (L) 08/01/2018 0335   ALBUMIN 4.1 02/21/2018 0817   AST 22 08/01/2018 0335   ALT 23 08/01/2018 0335   ALKPHOS 72 08/01/2018 0335   BILITOT 0.5 08/01/2018 0335   BILITOT 0.3 02/21/2018 0817   GFRNONAA >60 08/02/2018 0359   GFRAA >60 08/02/2018 0359   Lipase     Component Value Date/Time   LIPASE 27 07/22/2018 1903       Studies/Results: Dg Abd Portable 1v  Result Date: 08/01/2018 CLINICAL DATA:  Ileus. Recent closed loop small bowel obstruction with ischemia requiring small bowel resection and ileocecectomy. EXAM: PORTABLE ABDOMEN - 1 VIEW COMPARISON:  Abdominal x-ray dated Jul 25, 2018. FINDINGS: There are multiple new mildly dilated loops of air-filled small bowel in the abdomen measuring up to 4.5 cm in diameter. A small amount of stool is seen in the rectum. No acute osseous abnormality. IMPRESSION: Findings consistent with ileus. Electronically Signed   By: Titus Dubin M.D.   On: 08/01/2018 10:05    Anti-infectives: Anti-infectives (From admission, onward)   Start     Dose/Rate Route Frequency Ordered Stop   07/25/18 1000  ceFAZolin (ANCEF) IVPB 2g/100 mL premix     2 g 200 mL/hr  over 30 Minutes Intravenous To Short Stay 07/25/18 0749 07/25/18 1200   07/25/18 0745  ceFAZolin (ANCEF) IVPB 2g/100 mL premix  Status:  Discontinued     2 g 200 mL/hr over 30 Minutes Intravenous  Once 07/25/18 0735 07/25/18 0749   07/23/18 1000  cefoTEtan (CEFOTAN) 1 g in sodium chloride 0.9 % 100 mL IVPB     1 g 200 mL/hr over 30 Minutes Intravenous  Once 07/23/18 0825 07/23/18 1117       Assessment/Plan POD10/8elap, sbr, open abdomen followed by closure/ileocecectomy/ileocolonic anastomosis-[place] - plain film yesterday showed ileus, but clinically patient feels better today - CT abd/pelvis today which still shows some dilated small bowel and some intra-abdominal fluid collections too  small to drain, and a collection in abdominal wall inferior to wound - [employer] - calorie count completed, unable to wean TPN at this time,  - mobilize and cont to work with therapies. hold tx to CIR for now - cont scheduled tylenol and prn oxy 5mg , prn robaxin - midline wound is necrotic with dehiscence and some visible bowel.  Cont TID dressing changes but add mepitel to wound base. Monitor closely for signs of EC fistula. Abdominal binder and minimize use of abdominal muscles when getting up.   Afib - Cardiology following, eliquis R IJ and brachial DVT  FEN -liquids only, resource breeze, TPN VTE -eliquis ID -none currently Follow up Donne [facility]el  LOS: 11 days    Brigid Re , [facility] 08/02/2018, 9:29 AM Pager: [place]: [phone]

## 2018-08-02 NOTE — Progress Notes (Signed)
Inpatient Rehabilitation-Admissions Coordinator   Pt not medically ready for CIR at this time. Will follow up Monday.   Please call if questions.   Jhonnie Garner, OTR/L  Rehab Admissions Coordinator  [phone] 08/02/2018 12:53 PM

## 2018-08-03 LAB — URINALYSIS, ROUTINE W REFLEX MICROSCOPIC
Bilirubin Urine: NEGATIVE
Glucose, UA: NEGATIVE mg/dL
Ketones, ur: NEGATIVE mg/dL
Nitrite: NEGATIVE
Protein, ur: NEGATIVE mg/dL
Specific Gravity, Urine: 1.011 (ref 1.005–1.030)
pH: 5 (ref 5.0–8.0)

## 2018-08-03 LAB — BASIC METABOLIC PANEL
Anion gap: 7 (ref 5–15)
BUN: 21 mg/dL (ref 8–23)
CO2: 25 mmol/L (ref 22–32)
Calcium: 7.5 mg/dL — ABNORMAL LOW (ref 8.9–10.3)
Chloride: 97 mmol/L — ABNORMAL LOW (ref 98–111)
Creatinine, Ser: 0.43 mg/dL — ABNORMAL LOW (ref 0.44–1.00)
GFR calc Af Amer: >60 mL/min (ref >60)
GFR calc non Af Amer: >60 mL/min (ref >60)
Glucose, Bld: 114 mg/dL — ABNORMAL HIGH (ref 70–99)
Potassium: 4.3 mmol/L (ref 3.5–5.1)
Sodium: 129 mmol/L — ABNORMAL LOW (ref 135–145)

## 2018-08-03 LAB — GLUCOSE, CAPILLARY
Glucose-Capillary: 106 mg/dL — ABNORMAL HIGH (ref 70–99)
Glucose-Capillary: 112 mg/dL — ABNORMAL HIGH (ref 70–99)
Glucose-Capillary: 114 mg/dL — ABNORMAL HIGH (ref 70–99)
Glucose-Capillary: 117 mg/dL — ABNORMAL HIGH (ref 70–99)

## 2018-08-03 LAB — CBC
HCT: 28.1 % — ABNORMAL LOW (ref 36.0–46.0)
Hemoglobin: 9.3 g/dL — ABNORMAL LOW (ref 12.0–15.0)
MCH: 30.2 pg (ref 26.0–34.0)
MCHC: 33.1 g/dL (ref 30.0–36.0)
MCV: 91.2 fL (ref 80.0–100.0)
Platelets: 416 10*3/uL — ABNORMAL HIGH (ref 150–400)
RBC: 3.08 MIL/uL — ABNORMAL LOW (ref 3.87–5.11)
RDW: 15.3 % (ref 11.5–15.5)
WBC: 16.7 10*3/uL — ABNORMAL HIGH (ref 4.0–10.5)
nRBC: 0 % (ref 0.0–0.2)

## 2018-08-03 MED ORDER — FUROSEMIDE 10 MG/ML IJ SOLN
20.0000 mg | Freq: Once | INTRAMUSCULAR | Status: AC
  Administered 2018-08-03: 20 mg via INTRAVENOUS
  Filled 2018-08-03: qty 2

## 2018-08-03 MED ORDER — TRAVASOL 10 % IV SOLN
INTRAVENOUS | Status: AC
  Administered 2018-08-03: 18:00:00 via INTRAVENOUS
  Filled 2018-08-03: qty 1008

## 2018-08-03 NOTE — Progress Notes (Signed)
Progress Note  Patient Name: Rachel Park Date of Encounter: 08/03/2018  Primary Cardiologist: Mertie Moores, MD   Subjective   Pt denies CP   Breathing is fair   APpears a little tachypneic  Inpatient Medications    Scheduled Meds: . acetaminophen  1,000 mg Oral Q6H  . apixaban  5 mg Oral BID  . chewing gum (ORBIT) sugar free  1 Stick Oral QID  . chlorhexidine  15 mL Mouth Rinse BID  . Chlorhexidine Gluconate Cloth  6 each Topical Daily  . diltiazem  180 mg Oral Daily  . feeding supplement  1 Container Oral TID BM  . insulin aspart  0-9 Units Subcutaneous Q6H  . iohexol  100 mL Intravenous Once  . levothyroxine  75 mcg Oral QAC breakfast  . mouth rinse  15 mL Mouth Rinse q12n4p  . metoprolol tartrate  25 mg Oral BID  . pantoprazole  40 mg Oral Daily  . ramelteon  8 mg Oral QHS  . sodium chloride flush  10-40 mL Intracatheter Q12H   Continuous Infusions: . piperacillin-tazobactam (ZOSYN)  IV 3.375 g (08/03/18 0706)  . TPN ADULT (ION) 70 mL/hr at 08/02/18 1725  . TPN ADULT (ION)     PRN Meds: methocarbamol, ondansetron **OR** ondansetron (ZOFRAN) IV, oxyCODONE, sodium chloride flush   Vital Signs    Vitals:   08/02/18 0703 08/02/18 0807 08/02/18 0843 08/02/18 2010  BP:  127/86  134/87  Park:  (!) 106  (!) 54  Resp:  (!) 23    Temp:   97.9 F (36.6 C) 98 F (36.7 C)  TempSrc:    Oral  SpO2:  92%    Weight: 75.4 kg     Height:        Intake/Output Summary (Last 24 hours) at 08/03/2018 0908 Last data filed at 08/03/2018 0045 Gross per 24 hour  Intake 560.66 ml  Output 2100 ml  Net -1539.34 ml    Net +7.8 L     Last 3 Weights 08/02/2018 08/01/2018 07/31/2018  Weight (lbs) 166 lb 3.6 oz 166 lb 14.2 oz 161 lb 13.1 oz  Weight (kg) 75.4 kg 75.7 kg 73.4 kg      Telemetry    Atrial fibrillation rate controlled 90s to 100s - Personally Reviewed  Physical Exam   GEN: NAD, WD Neck: supple, JVP is increased   Cardiac: irregular, normal rate Respiratory:  REl clear anteriorly   GI: s/p abdominal surgery  Sl distended   + BS   MS: 1+  edema Neuro:  Grossly intact  Labs    Chemistry Recent Labs  Lab 07/29/18 0403  08/01/18 0335 08/02/18 0359 08/03/18 0421  NA 135   < > 131* 129* 129*  K 3.5   < > 4.4 4.8 4.3  CL 102   < > 96* 96* 97*  CO2 25   < > 25 24 25   GLUCOSE 121*   < > 105* 113* 114*  BUN 14   < > 22 24* 21  CREATININE 0.32*   < > 0.42* 0.34* 0.43*  CALCIUM 7.7*   < > 7.8* 7.7* 7.5*  PROT 4.5*  --  4.6*  --   --   ALBUMIN 1.9*  --  1.8*  --   --   AST 13*  --  22  --   --   ALT 20  --  23  --   --   ALKPHOS 50  --  72  --   --  BILITOT 0.8  --  0.5  --   --   GFRNONAA >60   < > >60 >60 >60  GFRAA >60   < > >60 >60 >60  ANIONGAP 8   < > 10 9 7    < > = values in this interval not displayed.     Hematology Recent Labs  Lab 07/31/18 0916 08/02/18 0359 08/03/18 0421  WBC 11.6* 16.7* 16.7*  RBC 3.33* 3.12* 3.08*  HGB 10.1* 9.3* 9.3*  HCT 30.2* 28.9* 28.1*  MCV 90.7 92.6 91.2  MCH 30.3 29.8 30.2  MCHC 33.4 32.2 33.1  RDW 15.1 15.4 15.3  PLT 353 410* 416*     Cardiac Studies   Echo:  04/21/18  1. The left ventricle has normal systolic function, with an ejection fraction of 55-60%. The cavity size was normal. Mild basal septal hypertrophy. Left ventricular diastology could not be evaluated secondary to atrial fibrillation. 2. The right ventricle has normal systolic function. The cavity was normal. There is no increase in right ventricular wall thickness. 3. Left atrial size was moderately dilated. 4. The mitral valve is normal in structure. 5. The tricuspid valve is normal in structure. Tricuspid valve regurgitation is mild-moderate. 6. The aortic valve is tricuspid Mild sclerosis of the aortic valve. 7. The pulmonic valve was normal in structure. Pulmonic valve regurgitation is mild by color flow Doppler. 8. Right atrial pressure is estimated at 3 mmHg.   Patient Profile     85 y.o. female  with atrial fib and CAD.  We are seeing her post op for atrial fib.    Assessment & Plan    1 Persistent atrial fibrillation-patient's heart rate is now controlled.  Continue rate control with  Cardizem and metoprolol.  On apixiban   Could transition to heparin  Once patient recovers from surgery the ultimate goal will be to proceed with cardioversion.    2 small bowel resection s/p ileocecetomy-per surgery WBC is increased   On ABX  + BS    3 CAD-resume statin at DC.  4 possible liver lesion -abd ultrasound as outpt recommended.  5 acute diastolic congestive heart failure  Volume is up   WIll give lasix x 1   Follow exam and urine output   6  DVT  USN yesterday showed   R IJ and R brachial veins    On anticoagulation    7   Increased WBC   On ABX  May reflect DVT   Follow   Afebrile    For questions or updates, please contact [facility] Please consult [email or web address] for contact info under        Signed, Dorris Carnes, MD  08/03/2018, 9:08 AM

## 2018-08-03 NOTE — Progress Notes (Addendum)
PHARMACY - ADULT TOTAL PARENTERAL NUTRITION CONSULT NOTE   Pharmacy Consult for TPN Indication: Prolonged ileus  Patient Measurements: Height: 5' 2.5" (158.8 cm) Weight: 166 lb 3.6 oz (75.4 kg) IBW/kg (Calculated) : 51.25 TPN AdjBW (KG): 54.7 Body mass index is 29.92 kg/m. Usual Weight: 63-69kg  Assessment: Rachel Park is an 85yo female admitted with hx of SBO with N/V and epigastric pain on 5/18. Later taken to OR for exploratory laparotomy and found to have necrotic bowel with resection with open abdomen followed by closure/ileocecectomy/ileocolonic anastomosis. Surgery consulted pharmacy for TPN as would like patient to have bowel rest and expects prolonged ileus.  GI: s/p bowel resection with expectation of prolonged ileus as above; 5/23 NGT out. 3 stools 5/27.  Prealb 6.8; LBM 5/28 Endo: BG < 140s on SSI Insulin requirements in the past 24 hours: 0 units Lytes: Na remains 129, K 4.3 (goal > 4) Renal:SCr 0.43; 1.2 mL/kg/hr UOP Pulm: extubated on RA Cards: BP 120s/90s (improved) with HR 80-90s (Atrial fibrillation on Apixaban). PICC DVT - superficial and on AC - ok to continue PICC Hepatobil:t bili 0.5, AST/ALT WNL Trigs 103 Neuro: Pain score 2-7. Oxycodone.  ID: periop abx completed. WBC up slightly 16.7. Afebrile.   TPN Access: PICC line placed 5/19 TPN start date: 5/22 Nutritional Goals (per RD recommendation on 08/01/2018): Kcal: 6644-0347 Protein: 85-105 grams Fluid: >/= 1.5 L  Current Nutrition: TPN Full liquids - 14% nutritional needs with only 4% protein needs [employer] - 2 yesterday 5/27 start calorie count to see if we can start to wean TNA  Plan:  Continue TPN at goal 70 mL/hr. This TPN provides 100 g protein, 45 g lipids, and total of 1655 kcal/day; meeting 100% of patient needs  Electrolytes in TPN: increase Na further, continue increased Mag, increase K.  Continue MVI, trace elements MWF only due to shortage Continue SSIq6  Monitor TPN  labs Monitor po intake (full liquid diet) and calorie count  Levester Fresh, PharmD, BCPS, BCCCP Clinical Pharmacist [phone]  Please check AMION for all [place] numbers  08/03/2018 7:52 AM

## 2018-08-03 NOTE — Progress Notes (Addendum)
Family Medicine Teaching Service Daily Progress Note Intern Pager: [phone]  Patient name: Rachel Park Medical record number: [id number] Date of birth: 12-06-1931 Age: 85 y.o. Gender: female  Primary Care Provider: Lajean Manes, MD Consultants: CCM (s/o), gen surg, cardiology, VVS Code Status: full  Pt Overview and Major Events to Date:  5/18 admitted to [facility] 5/19 ex lap, underwent colonic resection 5/21 underwent ileocolic anastomosis 1/02 extubated 5/24 out to floor, care resumed by FPTS 5/28 - worsening dehiscence of wound 5/29 - CT abd/CXR  Assessment and Plan: 85 year old female who presented with severe abdominal pain.  Found to have ischemic bowel on ex lap, bowel resected and underwent reanastomosis.  Has developed ileus postoperatively, extubated on 5/22 and transferred out of floor on 5/24.  Wound Dehiscence s/p ileocolic anastomosis  Wound continues to further dehisce and become more necrotic.  Large fluid collection within the anterior abdominal wall along the inferior margin of the incision.  Surgery started Zosyn on 5/29.  Patient's wound healing complicated by her poor nutritional status secondary to multiple surgeries and intubation and old age.  Receiving TPN pharmacy consult, appreciate their help.  Appreciate surgery help, per their note advance diet to full liquids and continue 3 times daily dressing changes.  Wonder if patient would benefit from wound VAC at this point, but unclear given the necrosis of the wound. -Surgery following appreciate recs -TPN, pharmacy to dose, currently 70 mL/h - electrolyte repletion per pharmacy -Continue Zosyn, pharmacy to dose -Abdominal binder when ambulating -3 times daily when dressing changes per surgery -Oxycodone 2.5 mg every 4 hours as needed -Tylenol 1 g every 6 hours -Discharge to CIR when appropriate -Robaxin 500 mg every 8 hours as needed per surgery  Possible Ileus Patient with small bowel dilatation proximal to  repair site on CT abdomen pelvis from 5/29.  Read as possible ileus.  Patient continues to have good output, reports multiple stools yesterday and good flatus.  No nausea vomiting.  Per surgery will advance diet to full liquids and see how she does.  If develops nausea vomiting will make n.p.o. and put down NG tube.  Volume overload Patient with low oxygen requirement on 5/29 and has noticeable pain edema which is often her baseline.  Given 20 mg IV Lasix on 5/29 with 2.1 L out last 24 hours.  Patient still has at least 2+ pitting edema bilateral lower extremities, mild crackles left side.  Will give an additional 20 mg IV Lasix this a.m. and monitor.  Volume overload likely secondary to a number of factors including poor blood protein and receiving a lot of fluid during the admission.   Atrial fibrillation Heart rates between 85 and 96 last 24 hours, regular rate and rhythm on exam this a.m.  Appreciate cardiology recommendations.  On apixaban 5 mg twice daily.  On diltiazem 24-hour capsule 180 mg, Lopressor 25 mg twice daily. -Continue Cardizem CD 100 mg daily, metoprolol 25 mg twice daily -Continue apixaban 5 mg twice daily -Daily BMP -Appreciate cardiology recommendations -Likely candidate for cardioversion after recovers from inpatient ailments  Right IJ and right brachial DVT Likely catheter associated as she has had a right IJ catheter with tip terminating right subclavian vein.  Vascular surgery was consulted for this, appreciate recommendations.  Recommendations are that given the placement of the DVT and that she is already on Eliquis 5 mg twice daily, no medication changes are necessary and the catheter can stay in.  Insomnia - pt having difficulty sleeping, likely  from combination of pain and anxiety.   - Ramelteon qhs  Small left pleural effusion  Likely secondary to atelectasis.  No progression from last x-ray.  We will continue to monitor for fever or leukocytosis.   - incentive  spirometry  Hypothyroidism -  Pt takes 23mcg synthroid at home daily.  - continue home med  Pseudo-hypocalcemia Calcium 7.5 this a.m., albumin 1.8 on CMP from 5/28.  Corrects to 9.3, within normal range - Monitor with daily calcium  Normocytic anemia Low 9.3, MCV 91.  Likely secondary to blood loss and poor nutrition we will continue to follow with CBC.  FEN/GI:  Full liquid diet PPx: Pantoprazole 40 mg, Eliquis 5 mg twice daily  Disposition: likely CIR  Subjective:  Settle down this a.m.  Patient states that she is come to terms of how long her recovery is going to take.  Having good bowel movements, no nausea vomiting.  Pain well controlled  Objective: Temp:  [98 F (36.7 C)-98.3 F (36.8 C)] 98.3 F (36.8 C) (05/30 0917) Pulse Rate:  [54-96] 96 (05/30 1044) Resp:  [19-29] 19 (05/30 1044) BP: (117-134)/(85-87) 117/85 (05/30 0917) SpO2:  [96 %-98 %] 98 % (05/30 1044) Physical Exam: General: A and O x4, no acute distress, resting comfortably Cardiovascular: Regular rate, irregular rhythm.  Skin warm and dry.  Notable right upper extremity swelling, has improved some per reports.  Small ecchymoses on right hand. Respiratory: No sensory muscle use, recently on room air, no distress Abdomen: Abdomen tender to palpation along incision line, bowel sounds present, no distention  Laboratory: Recent Labs  Lab 07/31/18 0916 08/02/18 0359 08/03/18 0421  WBC 11.6* 16.7* 16.7*  HGB 10.1* 9.3* 9.3*  HCT 30.2* 28.9* 28.1*  PLT 353 410* 416*   Recent Labs  Lab 07/29/18 0403  08/01/18 0335 08/02/18 0359 08/03/18 0421  NA 135   < > 131* 129* 129*  K 3.5   < > 4.4 4.8 4.3  CL 102   < > 96* 96* 97*  CO2 25   < > 25 24 25   BUN 14   < > 22 24* 21  CREATININE 0.32*   < > 0.42* 0.34* 0.43*  CALCIUM 7.7*   < > 7.8* 7.7* 7.5*  PROT 4.5*  --  4.6*  --   --   BILITOT 0.8  --  0.5  --   --   ALKPHOS 50  --  72  --   --   ALT 20  --  23  --   --   AST 13*  --  22  --   --    GLUCOSE 121*   < > 105* 113* 114*   < > = values in this interval not displayed.    Guadalupe Dawn, MD 08/03/2018, 11:17 AM PGY-2, [place] Intern pager: [phone], text pages welcome

## 2018-08-03 NOTE — Progress Notes (Signed)
[facility]  Subjective  - Sitting up in chair.  Very pleasant.  Stopped by to answer any additional questions or concerns.   Objective 117/85 85 98.3 F (36.8 C) (Oral) (!) 29 96%  Intake/Output Summary (Last 24 hours) at 08/03/2018 0945 Last data filed at 08/03/2018 0045 Gross per 24 hour  Intake 560.66 ml  Output 2100 ml  Net -1539.34 ml    Right arm picc, no significant swelling, RIJ catheter previously removed, right radial pulse 2+  Laboratory Lab Results: Recent Labs    08/02/18 0359 08/03/18 0421  WBC 16.7* 16.7*  HGB 9.3* 9.3*  HCT 28.9* 28.1*  PLT 410* 416*   BMET Recent Labs    08/02/18 0359 08/03/18 0421  NA 129* 129*  K 4.8 4.3  CL 96* 97*  CO2 24 25  GLUCOSE 113* 114*  BUN 24* 21  CREATININE 0.34* 0.43*  CALCIUM 7.7* 7.5*    COAG Lab Results  Component Value Date   INR 1.1 07/23/2018   INR 0.9 09/20/2016   INR 0.97 12/24/2014   No results found for: PTT  Assessment/Planning:  As previously noted, likely incidental discovery of catheter associated DVT in right IJ.  She did have a RIJ catheter as noted on CXR from 5/19 with tip in likely right subclavian vein.  Already on anticoagulation for afib with Eliquis restarted which should be appropriate coverage for IJ DVT.  Current chest guidelines state can leave catheter in place if on anticoagulation and PICC can stay in place given use for TPN.  Right arm not symptomatic.  If issues can elevate and gentle compression.  Call vascular with questions or concerns, we will sign off.     Marty Heck 08/03/2018 9:45 AM --

## 2018-08-03 NOTE — Progress Notes (Signed)
9 Days Post-Op    VO:[id number] pain/nausea and vomiting  Subjective: She looks pretty good, up to chair.  Says her breathing is a little better.  Midline wound soupy with some odor.  Getting TID dressing changes.  Tolerating clears and says she likes the broth, some stool, no  complaints about pain.    Objective: Vital signs in last 24 hours: Temp:  [98 F (36.7 C)] 98 F (36.7 C) (05/29 2010) Pulse Rate:  [54] 54 (05/29 2010) BP: (134)/(87) 134/87 (05/29 2010) Last BM Date: 08/01/18 Nothing PO recorded 560 urine recorded 2100 urine Stool x 2 Afebrile, VSS Na 120 CA 7.5 WBC 16.7 H/H 9.3/28 CT abdomen pelvis with contrast 08/02/2018: Interval partial small bowel resection, and ileocolonic anastomosis.  There is dilatation of the distal small bowel with a patent anastomosis, most likely secondary postoperative ileus. No bowel wall thickening or other signs of recurrent bowel ischemia.  No extravasated enteric contrast.  Small intra-abdominal fluid collection adjacent to the ileocolonic anastomosis and small bowel mesentery no drainable collection there is a large collection within the anterior abdominal wall along the inferior margins of the abdomen incision.  Generalized anasarca/soft tissue edema, pleural effusions/pelvic ascites.   Intake/Output from previous day: 05/29 0701 - 05/30 0700 In: 560.7 [I.V.:560.7] Out: 2100 [Urine:2100] Intake/Output this shift: No intake/output data recorded.  General appearance: alert, cooperative and no distress Resp: clear to auscultation bilaterally and breathing is better, still in AF GI: soft, sore, open wound looks about the same, soupy with dehiscence.  + BS, +BM tolerating clears  Lab Results:  Recent Labs    08/02/18 0359 08/03/18 0421  WBC 16.7* 16.7*  HGB 9.3* 9.3*  HCT 28.9* 28.1*  PLT 410* 416*    BMET Recent Labs    08/02/18 0359 08/03/18 0421  NA 129* 129*  K 4.8 4.3  CL 96* 97*  CO2 24 25  GLUCOSE 113* 114*   BUN 24* 21  CREATININE 0.34* 0.43*  CALCIUM 7.7* 7.5*   PT/INR No results for input(s): LABPROT, INR in the last 72 hours.  Recent Labs  Lab 07/29/18 0403 08/01/18 0335  AST 13* 22  ALT 20 23  ALKPHOS 50 72  BILITOT 0.8 0.5  PROT 4.5* 4.6*  ALBUMIN 1.9* 1.8*     Lipase     Component Value Date/Time   LIPASE 27 07/22/2018 1903     Prior to Admission medications   Medication Sig Start Date End Date Taking? Authorizing Provider  acetaminophen (TYLENOL) 500 MG tablet Take 500 mg by mouth every 6 (six) hours as needed for mild pain.    Yes [provider]  amiodarone (PACERONE) 200 MG tablet Take 1 tablet (200 mg total) twice daily for one month.  Then reduce and take 1 tablet daily 06/24/18  Yes Nahser, Wonda Cheng, MD  apixaban (ELIQUIS) 5 MG TABS tablet Take 1 tablet (5 mg total) by mouth 2 (two) times daily. 06/24/18  Yes Nahser, Wonda Cheng, MD  CALCIUM PO Take 1 tablet by mouth daily.   Yes [provider]  cholecalciferol (VITAMIN D) 1000 UNITS tablet Take 2,000 Units by mouth daily with lunch.    Yes [provider]  diltiazem (CARDIZEM CD) 180 MG 24 hr capsule Take 1 capsule (180 mg total) by mouth daily. 06/24/18  Yes Nahser, Wonda Cheng, MD  levothyroxine (SYNTHROID, LEVOTHROID) 75 MCG tablet Take 75 mcg by mouth daily before breakfast.   Yes [provider]  metoprolol tartrate (LOPRESSOR) 25 MG  tablet Take 1 tablet (25 mg total) by mouth 2 (two) times daily. 06/24/18  Yes Nahser, Wonda Cheng, MD  NITROSTAT 0.4 MG SL tablet Place 1 tablet (0.4 mg total) under the tongue every 5 (five) minutes as needed. Chest pain 06/24/18  Yes Nahser, Wonda Cheng, MD  Polyethyl Glycol-Propyl Glycol (SYSTANE ULTRA) 0.4-0.3 % SOLN Place 1-2 drops into both eyes 3 (three) times daily as needed (dry eyes).   Yes [provider]  raNITIdine HCl (ZANTAC PO) Take 1 tablet by mouth daily as needed (heartbrun).   Yes [provider]  rosuvastatin (CRESTOR) 10  MG tablet Take 1 tablet (10 mg total) by mouth daily. 06/24/18  Yes Nahser, Wonda Cheng, MD   Medications: . acetaminophen  1,000 mg Oral Q6H  . apixaban  5 mg Oral BID  . chewing gum (ORBIT) sugar free  1 Stick Oral QID  . chlorhexidine  15 mL Mouth Rinse BID  . Chlorhexidine Gluconate Cloth  6 each Topical Daily  . diltiazem  180 mg Oral Daily  . feeding supplement  1 Container Oral TID BM  . insulin aspart  0-9 Units Subcutaneous Q6H  . iohexol  100 mL Intravenous Once  . levothyroxine  75 mcg Oral QAC breakfast  . mouth rinse  15 mL Mouth Rinse q12n4p  . metoprolol tartrate  25 mg Oral BID  . pantoprazole  40 mg Oral Daily  . ramelteon  8 mg Oral QHS  . sodium chloride flush  10-40 mL Intracatheter Q12H   . piperacillin-tazobactam (ZOSYN)  IV 3.375 g (08/03/18 0706)  . TPN ADULT (ION) 70 mL/hr at 08/02/18 1725  . TPN ADULT (ION)     Assessment/Plan Atrial fibrillation benign lesion -currently on Eliquis Hx CAD/acute diastolic CHF Right IJ for acute DVT - Xarelto per Dr. Monica Martinez Hypothyroid - on Synthroid Left pleural effusion Hx of hypercholesterolemia Protein calorie malnutrition -prealbumin 6.2 >>6.8 (5/24)  Small bowel obstruction with ischemic bowel .  Exploratory laparotomy, small bowel resection, 511/19/2020, Dr. Rolm Bookbinder POD#11 Exploratory laparotomy, ileocecectomy with ileocolonic anastomosis, abdominal closure 11/25/2018, Dr. Rolm Bookbinder POD #9  FEN: TPN/Clear liquids ID:  Zosyn 5/29 >> day 2 DVT:  apixaban Follow up:  Dr. Donne Hazel POC:   Mathis Fare   [phone]  Early,Tammi Daughter   [phone]      Plan:  Advance diet to full liquids, Dr. Harrington Challenger is going to give her more lasix.  Continue TID dressing changes.   LOS: 12 days    Rodger Giangregorio 08/03/2018 [phone]

## 2018-08-04 LAB — GLUCOSE, CAPILLARY
Glucose-Capillary: 119 mg/dL — ABNORMAL HIGH (ref 70–99)
Glucose-Capillary: 126 mg/dL — ABNORMAL HIGH (ref 70–99)
Glucose-Capillary: 110 mg/dL — ABNORMAL HIGH (ref 70–99)
Glucose-Capillary: 145 mg/dL — ABNORMAL HIGH (ref 70–99)

## 2018-08-04 LAB — CBC
HCT: 28 % — ABNORMAL LOW (ref 36.0–46.0)
Hemoglobin: 9.3 g/dL — ABNORMAL LOW (ref 12.0–15.0)
MCH: 30.1 pg (ref 26.0–34.0)
MCHC: 33.2 g/dL (ref 30.0–36.0)
MCV: 90.6 fL (ref 80.0–100.0)
Platelets: 498 10*3/uL — ABNORMAL HIGH (ref 150–400)
RBC: 3.09 MIL/uL — ABNORMAL LOW (ref 3.87–5.11)
RDW: 15.3 % (ref 11.5–15.5)
WBC: 11.8 10*3/uL — ABNORMAL HIGH (ref 4.0–10.5)
nRBC: 0 % (ref 0.0–0.2)

## 2018-08-04 LAB — BASIC METABOLIC PANEL
Anion gap: 12 (ref 5–15)
BUN: 19 mg/dL (ref 8–23)
CO2: 23 mmol/L (ref 22–32)
Calcium: 7.8 mg/dL — ABNORMAL LOW (ref 8.9–10.3)
Chloride: 97 mmol/L — ABNORMAL LOW (ref 98–111)
Creatinine, Ser: 0.48 mg/dL (ref 0.44–1.00)
GFR calc Af Amer: >60 mL/min (ref >60)
GFR calc non Af Amer: >60 mL/min (ref >60)
Glucose, Bld: 120 mg/dL — ABNORMAL HIGH (ref 70–99)
Potassium: 3.7 mmol/L (ref 3.5–5.1)
Sodium: 132 mmol/L — ABNORMAL LOW (ref 135–145)

## 2018-08-04 MED ORDER — PHENOL 1.4 % MT LIQD: 1.0000 | OROMUCOSAL | Status: DC | PRN

## 2018-08-04 MED ORDER — POTASSIUM CHLORIDE CRYS ER 20 MEQ PO TBCR
20.0000 meq | EXTENDED_RELEASE_TABLET | Freq: Once | ORAL | Status: AC
  Administered 2018-08-05: 20 meq via ORAL
  Filled 2018-08-04: qty 1

## 2018-08-04 MED ORDER — TRAVASOL 10 % IV SOLN
INTRAVENOUS | Status: AC
  Administered 2018-08-04: 18:00:00 via INTRAVENOUS
  Filled 2018-08-04: qty 1008

## 2018-08-04 MED ORDER — HYDROCORTISONE 1 % EX CREA
1.0000 "application " | TOPICAL_CREAM | Freq: Three times a day (TID) | CUTANEOUS | Status: DC | PRN
  Filled 2018-08-04: qty 28

## 2018-08-04 MED ORDER — ALTEPLASE 2 MG IJ SOLR
2.0000 mg | Freq: Once | INTRAMUSCULAR | Status: AC
  Administered 2018-08-04: 2 mg
  Filled 2018-08-04: qty 2

## 2018-08-04 MED ORDER — SODIUM CHLORIDE 0.9% FLUSH
3.0000 mL | Freq: Two times a day (BID) | INTRAVENOUS | Status: DC
  Administered 2018-08-04 – 2018-08-09 (×5): 3 mL via INTRAVENOUS

## 2018-08-04 MED ORDER — FUROSEMIDE 10 MG/ML IJ SOLN
80.0000 mg | Freq: Once | INTRAMUSCULAR | Status: AC
  Administered 2018-08-05: 80 mg via INTRAVENOUS
  Filled 2018-08-04: qty 8

## 2018-08-04 MED ORDER — GUAIFENESIN-DM 100-10 MG/5ML PO SYRP: 10.0000 mL | ORAL_SOLUTION | ORAL | Status: DC | PRN

## 2018-08-04 MED ORDER — HYDROCORTISONE (PERIANAL) 2.5 % EX CREA
1.0000 "application " | TOPICAL_CREAM | Freq: Four times a day (QID) | CUTANEOUS | Status: DC | PRN
  Filled 2018-08-04: qty 28.35

## 2018-08-04 MED ORDER — MENTHOL 3 MG MT LOZG
1.0000 | LOZENGE | OROMUCOSAL | Status: DC | PRN
  Filled 2018-08-04 (×2): qty 9

## 2018-08-04 MED ORDER — SODIUM CHLORIDE 0.9 % IV SOLN: 250.0000 mL | INTRAVENOUS | Status: DC | PRN

## 2018-08-04 MED ORDER — FUROSEMIDE 10 MG/ML IJ SOLN
80.0000 mg | Freq: Once | INTRAMUSCULAR | Status: AC
  Administered 2018-08-04: 80 mg via INTRAVENOUS
  Filled 2018-08-04: qty 8

## 2018-08-04 MED ORDER — LIP MEDEX EX OINT
1.0000 "application " | TOPICAL_OINTMENT | Freq: Two times a day (BID) | CUTANEOUS | Status: DC
  Administered 2018-08-04 – 2018-08-12 (×17): 1 via TOPICAL
  Filled 2018-08-04: qty 7

## 2018-08-04 MED ORDER — FUROSEMIDE 10 MG/ML IJ SOLN: 20.0000 mg | Freq: Two times a day (BID) | INTRAMUSCULAR | Status: DC

## 2018-08-04 MED ORDER — PSYLLIUM 95 % PO PACK
1.0000 | PACK | Freq: Two times a day (BID) | ORAL | Status: DC
  Administered 2018-08-04 – 2018-08-12 (×14): 1 via ORAL
  Filled 2018-08-04 (×18): qty 1

## 2018-08-04 MED ORDER — ALUM & MAG HYDROXIDE-SIMETH 200-200-20 MG/5ML PO SUSP: 30.0000 mL | Freq: Four times a day (QID) | ORAL | Status: DC | PRN

## 2018-08-04 MED ORDER — BISMUTH SUBSALICYLATE 262 MG/15ML PO SUSP
30.0000 mL | Freq: Three times a day (TID) | ORAL | Status: DC | PRN
  Filled 2018-08-04: qty 236

## 2018-08-04 MED ORDER — SODIUM CHLORIDE 0.9% FLUSH: 3.0000 mL | INTRAVENOUS | Status: DC | PRN

## 2018-08-04 NOTE — Progress Notes (Signed)
10 Days Post-Op    CC: Abdominal pain, nausea and vomiting  Subjective: She is rather tired this a.m.  She said she did not really have that good and evening.  She had trouble sleeping some shortness of breath and just generalized discomfort.  She was up in the chair about 4 hours yesterday.    Her open abdominal site is about the same yesterday I have a picture below.  It has this white soupy covering and there is some necrosis along with the right side in the upper half of the wound.  She has a fascial separation but there is no enteric leakage.    Objective: Vital signs in last 24 hours: Temp:  [97.5 F (36.4 C)-98.3 F (36.8 C)] 98.3 F (36.8 C) (05/31 0400) Pulse Rate:  [85-100] 98 (05/30 1318) Resp:  [16-29] 16 (05/30 2015) BP: (106-117)/(60-85) 107/60 (05/30 1318) SpO2:  [94 %-98 %] 94 % (05/30 1318) Weight:  [53.4 kg] 53.4 kg (05/31 0556) Last BM Date: 08/03/18 357 IV 1600 urine Stool x 4 Afebrile, VSS No labs/radiology studies yesterday-today   Intake/Output from previous day: 05/30 0701 - 05/31 0700 In: 357 [P.O.:357] Out: 1600 [Urine:1600] Intake/Output this shift: No intake/output data recorded.  General appearance: alert, cooperative, no distress and Tired Resp: clear to auscultation bilaterally GI: Abdomen soft.  Pictures below shows some necrosis and some soupy exudate covering the wound.  The majority of the wound is pink and looks pretty healthy.  She is having bowel movements.  Tolerating clears well.    Lab Results:  Recent Labs    08/02/18 0359 08/03/18 0421  WBC 16.7* 16.7*  HGB 9.3* 9.3*  HCT 28.9* 28.1*  PLT 410* 416*    BMET Recent Labs    08/02/18 0359 08/03/18 0421  NA 129* 129*  K 4.8 4.3  CL 96* 97*  CO2 24 25  GLUCOSE 113* 114*  BUN 24* 21  CREATININE 0.34* 0.43*  CALCIUM 7.7* 7.5*   PT/INR No results for input(s): LABPROT, INR in the last 72 hours.  Recent Labs  Lab 07/29/18 0403 08/01/18 0335  AST 13* 22  ALT 20  23  ALKPHOS 50 72  BILITOT 0.8 0.5  PROT 4.5* 4.6*  ALBUMIN 1.9* 1.8*     Lipase     Component Value Date/Time   LIPASE 27 07/22/2018 1903     Medications: . acetaminophen  1,000 mg Oral Q6H  . apixaban  5 mg Oral BID  . chewing gum (ORBIT) sugar free  1 Stick Oral QID  . chlorhexidine  15 mL Mouth Rinse BID  . Chlorhexidine Gluconate Cloth  6 each Topical Daily  . diltiazem  180 mg Oral Daily  . feeding supplement  1 Container Oral TID BM  . insulin aspart  0-9 Units Subcutaneous Q6H  . iohexol  100 mL Intravenous Once  . levothyroxine  75 mcg Oral QAC breakfast  . mouth rinse  15 mL Mouth Rinse q12n4p  . metoprolol tartrate  25 mg Oral BID  . pantoprazole  40 mg Oral Daily  . ramelteon  8 mg Oral QHS  . sodium chloride flush  10-40 mL Intracatheter Q12H    Assessment/Plan  Atrial fibrillation benign lesion -currently on Eliquis Hx CAD/acute diastolic CHF Right IJ for acute DVT - Xarelto per Dr. Monica Martinez Hypothyroid - on Synthroid Left pleural effusion Hx of hypercholesterolemia Protein calorie malnutrition -prealbumin 6.2 >>6.8 (5/24)  Small bowel obstruction with ischemic bowel .  Exploratory laparotomy, small  bowel resection, 511/19/2020, Dr. Rolm Bookbinder POD#12 Exploratory laparotomy, ileocecectomy with ileocolonic anastomosis, abdominal closure 11/25/2018, Dr. Rolm Bookbinder POD #10  FEN: TPN/Clear liquids ID:  Zosyn 5/29 >> day 3 DVT:  apixaban Follow up:  Dr. Donne Hazel POC:   Mathis Fare   [phone]  Early,Tammi Daughter   [phone]    Plan: We plan to advance her to full liquids yesterday but failed to accomplish that.  I have made sure she is on full liquids.  I have asked nutrition to see her and help get some protein in her.  I redid her dressing this morning and gently cleaned away some of the exudate.  I would continue her TPN until we have her on a soft diet and know she has adequate calorie intake.       LOS:  13 days    Rachel Park 08/04/2018 [phone]

## 2018-08-04 NOTE — Progress Notes (Signed)
Family Medicine Teaching Service Daily Progress Note Intern Pager: [phone]  Patient name: Rachel Park Medical record number: [id number] Date of birth: February 19, 1932 Age: 85 y.o. Gender: female  Primary Care Provider: Lajean Manes, MD Consultants: CCM (s/o), gen surg, cardiology, VVS Code Status: full  Pt Overview and Major Events to Date:  5/18 admitted to [facility] 5/19 ex lap, underwent colonic resection 5/21 underwent ileocolic anastomosis 9/70 extubated 5/24 out to floor, care resumed by FPTS 5/28 - worsening dehiscence of wound 5/29 - CT abd/CXR  Assessment and Plan: 85 year old female who presented with severe abdominal pain.  Found to have ischemic bowel on ex lap, bowel resected and underwent reanastomosis.  Has developed ileus postoperatively, extubated on 5/22 and transferred out of floor on 5/24.  Wound Dehiscence s/p ileocolic anastomosis  Wound continues to further dehisce and become more necrotic.  Large fluid collection within the anterior abdominal wall along the inferior margin of the incision.  Surgery started Zosyn on 5/29.  Patient's wound healing complicated by her poor nutritional status secondary to multiple surgeries and intubation and old age.  Receiving TPN pharmacy consult, appreciate their help.  Appreciate surgery help, per their note advance diet to full liquids and continue 3 times daily dressing changes.   -Surgery following appreciate recs -TPN, pharmacy to dose, currently 70 mL/h - electrolyte repletion per pharmacy -Continue Zosyn, pharmacy to dose -Abdominal binder when ambulating -3 times daily when dressing changes per surgery -Oxycodone 2.5-5mg  every 4 hours as needed -Tylenol 1 g every 6 hours -Discharge to CIR when appropriate -Robaxin 500 mg every 8 hours as needed per surgery  Possible Ileus, resolving Patient with small bowel dilatation proximal to repair site on CT abdomen pelvis from 5/29.  Read as possible ileus. 4 BM yesterday. States  her appetite is better.  - ADAT: full liquid  Volume overload  1658ml out yesterday. 1+ pitting edema in the feet bilaterally. Volume overload likely secondary to a number of factors including poor blood protein and receiving a lot of fluid during the admission. - 20mg  IV lasix BID  Atrial fibrillation Rate controlled, regular rhythm this am.   Appreciate cardiology recommendations.  On apixaban 5 mg twice daily.  On diltiazem 24-hour capsule 180 mg, Lopressor 25 mg twice daily. -Continue Cardizem CD 100 mg daily, metoprolol 25 mg twice daily -Continue apixaban 5 mg twice daily -Daily BMP -Appreciate cardiology recommendations -Likely candidate for cardioversion after recovers from inpatient ailments  Right IJ and right brachial DVT Likely catheter associated as she has had a right IJ catheter with tip terminating right subclavian vein.  Vascular surgery was consulted for this, appreciate recommendations.  Recommendations are that given the placement of the DVT and that she is already on Eliquis 5 mg twice daily, no medication changes are necessary and the catheter can stay in.  Insomnia - pt having difficulty sleeping, likely from combination of pain and anxiety. Says the medicine we gave helped her get to sleep but she wakes up 3 hours later and has trouble getting back to sleep. - Ramelteon qhs - encourage out of bed    Small left pleural effusion  Likely secondary to atelectasis.  No progression from last x-ray.  We will continue to monitor for fever or leukocytosis.   - incentive spirometry  Hypothyroidism -  Pt takes 16mcg synthroid at home daily.  - continue home med  Pseudo-hypocalcemia Calcium 7.5, albumin 1.8 on CMP from 5/28.  Corrects to 9.3, within normal range - has been  stable, monitor w/ twice weekly BMP  Normocytic anemia Low 9.3, MCV 91.  Likely secondary to blood loss and poor nutrition we will continue to follow with CBC.  FEN/GI:  Full liquid diet PPx:  Pantoprazole 40 mg, Eliquis 5 mg twice daily  Disposition: likely CIR  Subjective:  Pt states she is feeling better. Her appetite is improved and would like some milk today.  Her pain is still present but manageable. She was SOB in bed but improved when she sat in her chair.  The medicine helps her get to sleep but she wakes up 3 hrs later and cannot get back to sleep.    Objective: Temp:  [97.5 F (36.4 C)-98.3 F (36.8 C)] 97.6 F (36.4 C) (05/31 0856) Pulse Rate:  [85-108] 108 (05/31 0856) Resp:  [16-29] 27 (05/31 0856) BP: (106-117)/(60-85) 116/71 (05/31 0856) SpO2:  [94 %-98 %] 97 % (05/31 0856) Weight:  [53.4 kg] 53.4 kg (05/31 0556) Physical Exam: General: A and O x4, no acute distress, resting comfortably Cardiovascular: Regular rate, irregular rhythm.  Skin warm and dry.  Notable right upper extremity swelling, has improved some per reports.  Small ecchymoses on right hand. Respiratory: LCTAB.  No crackles or wheezes.  Abdomen: Abdomen tender to palpation along incision line, bowel sounds present, no distention. Wound appears to be more dehiscedd, but no necrosis or signs of infection seen.        Laboratory: Recent Labs  Lab 07/31/18 0916 08/02/18 0359 08/03/18 0421  WBC 11.6* 16.7* 16.7*  HGB 10.1* 9.3* 9.3*  HCT 30.2* 28.9* 28.1*  PLT 353 410* 416*   Recent Labs  Lab 07/29/18 0403  08/01/18 0335 08/02/18 0359 08/03/18 0421  NA 135   < > 131* 129* 129*  K 3.5   < > 4.4 4.8 4.3  CL 102   < > 96* 96* 97*  CO2 25   < > 25 24 25   BUN 14   < > 22 24* 21  CREATININE 0.32*   < > 0.42* 0.34* 0.43*  CALCIUM 7.7*   < > 7.8* 7.7* 7.5*  PROT 4.5*  --  4.6*  --   --   BILITOT 0.8  --  0.5  --   --   ALKPHOS 50  --  72  --   --   ALT 20  --  23  --   --   AST 13*  --  22  --   --   GLUCOSE 121*   < > 105* 113* 114*   < > = values in this interval not displayed.    Benay Pike, MD 08/04/2018, 9:09 AM PGY-2, [place] Intern pager:  [phone], text pages welcome

## 2018-08-04 NOTE — Progress Notes (Signed)
Progress Note  Patient Name: Rachel Park Date of Encounter: 08/04/2018  Primary Cardiologist: Mertie Moores, MD   Subjective   No CP   No palpitations  No SOB in chair    Inpatient Medications    Scheduled Meds: . acetaminophen  1,000 mg Oral Q6H  . apixaban  5 mg Oral BID  . chlorhexidine  15 mL Mouth Rinse BID  . Chlorhexidine Gluconate Cloth  6 each Topical Daily  . diltiazem  180 mg Oral Daily  . feeding supplement  1 Container Oral TID BM  . furosemide  20 mg Intravenous BID  . insulin aspart  0-9 Units Subcutaneous Q6H  . iohexol  100 mL Intravenous Once  . levothyroxine  75 mcg Oral QAC breakfast  . lip balm  1 application Topical BID  . mouth rinse  15 mL Mouth Rinse q12n4p  . metoprolol tartrate  25 mg Oral BID  . pantoprazole  40 mg Oral Daily  . psyllium  1 packet Oral BID  . ramelteon  8 mg Oral QHS  . sodium chloride flush  10-40 mL Intracatheter Q12H  . sodium chloride flush  3 mL Intravenous Q12H   Continuous Infusions: . sodium chloride    . piperacillin-tazobactam (ZOSYN)  IV 3.375 g (08/04/18 1238)  . TPN ADULT (ION) 70 mL/hr at 08/03/18 1811  . TPN ADULT (ION)     PRN Meds: sodium chloride, alum & mag hydroxide-simeth, bismuth subsalicylate, guaiFENesin-dextromethorphan, hydrocortisone, hydrocortisone cream, menthol-cetylpyridinium, methocarbamol, ondansetron **OR** ondansetron (ZOFRAN) IV, oxyCODONE, phenol, sodium chloride flush, sodium chloride flush   Vital Signs    Vitals:   08/04/18 0827 08/04/18 0856 08/04/18 0949 08/04/18 1233  BP:  116/71  126/69  Pulse:  (!) 108 85 85  Resp: (!) 26 (!) 27 17 18   Temp: 97.6 F (36.4 C) 97.6 F (36.4 C)  97.8 F (36.6 C)  TempSrc: Oral Oral  Oral  SpO2: 95% 97% 95% 98%  Weight:      Height:        Intake/Output Summary (Last 24 hours) at 08/04/2018 1328 Last data filed at 08/04/2018 1257 Gross per 24 hour  Intake 1290 ml  Output 1450 ml  Net -160 ml    I/O incomplete   Last 3 Weights  08/04/2018 08/02/2018 08/01/2018  Weight (lbs) 117 lb 11.6 oz 166 lb 3.6 oz 166 lb 14.2 oz  Weight (kg) 53.4 kg 75.4 kg 75.7 kg      Telemetry    Atrial fibrillation rate controlled 80s to 90s  - Personally Reviewed  Physical Exam   GEN: NAD, WD Neck: supple, JVP is increased   Cardiac: irregularly irreg   S1 S2  N omurmurs   Respiratory: REl clear anteriorly   GI: s/p abdominal surgery  Sl distended   + BS   MS: 1--2+  edema Neuro:  Grossly intact  Labs    Chemistry Recent Labs  Lab 07/29/18 0403  08/01/18 0335 08/02/18 0359 08/03/18 0421  NA 135   < > 131* 129* 129*  K 3.5   < > 4.4 4.8 4.3  CL 102   < > 96* 96* 97*  CO2 25   < > 25 24 25   GLUCOSE 121*   < > 105* 113* 114*  BUN 14   < > 22 24* 21  CREATININE 0.32*   < > 0.42* 0.34* 0.43*  CALCIUM 7.7*   < > 7.8* 7.7* 7.5*  PROT 4.5*  --  4.6*  --   --  ALBUMIN 1.9*  --  1.8*  --   --   AST 13*  --  22  --   --   ALT 20  --  23  --   --   ALKPHOS 50  --  72  --   --   BILITOT 0.8  --  0.5  --   --   GFRNONAA >60   < > >60 >60 >60  GFRAA >60   < > >60 >60 >60  ANIONGAP 8   < > 10 9 7    < > = values in this interval not displayed.     Hematology Recent Labs  Lab 07/31/18 0916 08/02/18 0359 08/03/18 0421  WBC 11.6* 16.7* 16.7*  RBC 3.33* 3.12* 3.08*  HGB 10.1* 9.3* 9.3*  HCT 30.2* 28.9* 28.1*  MCV 90.7 92.6 91.2  MCH 30.3 29.8 30.2  MCHC 33.4 32.2 33.1  RDW 15.1 15.4 15.3  PLT 353 410* 416*     Cardiac Studies   Echo:  04/21/18  1. The left ventricle has normal systolic function, with an ejection fraction of 55-60%. The cavity size was normal. Mild basal septal hypertrophy. Left ventricular diastology could not be evaluated secondary to atrial fibrillation. 2. The right ventricle has normal systolic function. The cavity was normal. There is no increase in right ventricular wall thickness. 3. Left atrial size was moderately dilated. 4. The mitral valve is normal in structure. 5. The tricuspid  valve is normal in structure. Tricuspid valve regurgitation is mild-moderate. 6. The aortic valve is tricuspid Mild sclerosis of the aortic valve. 7. The pulmonic valve was normal in structure. Pulmonic valve regurgitation is mild by color flow Doppler. 8. Right atrial pressure is estimated at 3 mmHg.   Patient Profile     85 y.o. female with atrial fib and CAD.  We are seeing her post op for atrial fib.    Assessment & Plan    1 Persistent atrial fibrillation-patient's heart rate is now controlled.  Continue rate control with  Cardizem and metoprolol.  On apixiban     2 small bowel resection s/p ileocecetomy-per surgery Pt tolerating some PO   Bowels moving    3 CAD-will resume Crestor 10.  4 possible liver lesion -abd ultrasound as outpt recommended.  5 acute diastolic congestive heart failure  Volume is still  up   Getting TPN   WIll give lasix 80 today  Strict I/O       6  DVT  USN yesterday showed   R IJ and R brachial veins    On anticoagulation    7   Increased WBC   On ABX  May reflect DVT   Afebrile   WBC not done today    8  Anemia  Hgb 9.3   Follow as diruese     For questions or updates, please contact [facility] Please consult [email or web address] for contact info under        Signed, Dorris Carnes, MD  08/04/2018, 1:28 PM

## 2018-08-04 NOTE — Progress Notes (Signed)
Patient ID: Rachel Park, female   DOB: 1931-11-11, 85 y.o.   MRN: [id number] Agree with plan,she looks well today, hopefully to soft food tomorrow and then wean tpn. Discussed plan with her son Dr Donnetta Hutching

## 2018-08-04 NOTE — Progress Notes (Signed)
Pt received 80 mg lasix earlier today   Appears to be having very good response Will give lasix again in AM with 20 KCL

## 2018-08-04 NOTE — Progress Notes (Signed)
[facility] CONSULT NOTE   Pharmacy Consult for TPN Indication: Prolonged ileus  Patient Measurements: Height: 5' 2.5" (158.8 cm) Weight: 117 lb 11.6 oz (53.4 kg) IBW/kg (Calculated) : 51.25 TPN AdjBW (KG): 54.7 Body mass index is 21.19 kg/m. Usual Weight: 63-69kg  Assessment: Rachel Park is an 85yo female admitted with hx of SBO with N/V and epigastric pain on 5/18. Later taken to OR for exploratory laparotomy and found to have necrotic bowel with resection with open abdomen followed by closure/ileocecectomy/ileocolonic anastomosis. Surgery consulted pharmacy for TPN as would like patient to have bowel rest and expects prolonged ileus.  GI: s/p bowel resection with expectation of prolonged ileus as above; 5/23 NGT out. 3 stools 5/27.  Prealb 6.8; LBM 5/28 Endo: BG < 140s on SSI Insulin requirements in the past 24 hours: 0 units Lytes: Na remains 129, K 4.3 (goal > 4) Renal:SCr 0.43; 1.2 mL/kg/hr UOP Pulm: extubated on RA Cards: BP 120s/90s (improved) with HR 80-90s (Atrial fibrillation on Apixaban). PICC DVT - superficial and on AC - ok to continue PICC Volume up per cards; giving lasix Hepatobil:t bili 0.5, AST/ALT WNL Trigs 103 Neuro: Pain score 2-7. Oxycodone.  ID: periop abx completed. WBC up slightly 16.7. Afebrile.   TPN Access: PICC line placed 5/19 TPN start date: 5/22 Nutritional Goals (per RD recommendation on 08/01/2018): Kcal: 1517-6160 Protein: 85-105 grams Fluid: >/= 1.5 L  Current Nutrition: TPN Full liquids - 14% nutritional needs with only 4% protein needs Resource Breeze - 3 yesterday  Plan:  Continue TPN at goal This TPN provides 100 g protein, 45 g lipids, and total of 1655 kcal/day; meeting 100% of patient needs  Electrolytes in TPN: increase Na further, continue increased Mag, increase K.  Continue MVI, trace elements MWF only due to shortage Continue SSIq6  Monitor TPN labs Monitor po intake (full liquid diet) and  calorie count; begin weaning Monday?  Rachel Park, PharmD, BCPS, BCCCP Clinical Pharmacist [phone]  Please check AMION for all [facility] numbers  08/04/2018 7:35 AM

## 2018-08-05 LAB — GLUCOSE, CAPILLARY
Glucose-Capillary: 106 mg/dL — ABNORMAL HIGH (ref 70–99)
Glucose-Capillary: 107 mg/dL — ABNORMAL HIGH (ref 70–99)
Glucose-Capillary: 118 mg/dL — ABNORMAL HIGH (ref 70–99)
Glucose-Capillary: 124 mg/dL — ABNORMAL HIGH (ref 70–99)
Glucose-Capillary: 128 mg/dL — ABNORMAL HIGH (ref 70–99)
Glucose-Capillary: 99 mg/dL (ref 70–99)

## 2018-08-05 LAB — CBC
HCT: 26 % — ABNORMAL LOW (ref 36.0–46.0)
Hemoglobin: 8.6 g/dL — ABNORMAL LOW (ref 12.0–15.0)
MCH: 30.1 pg (ref 26.0–34.0)
MCHC: 33.1 g/dL (ref 30.0–36.0)
MCV: 90.9 fL (ref 80.0–100.0)
Platelets: 473 10*3/uL — ABNORMAL HIGH (ref 150–400)
RBC: 2.86 MIL/uL — ABNORMAL LOW (ref 3.87–5.11)
RDW: 15.5 % (ref 11.5–15.5)
WBC: 9.9 10*3/uL (ref 4.0–10.5)
nRBC: 0 % (ref 0.0–0.2)

## 2018-08-05 LAB — DIFFERENTIAL
Abs Immature Granulocytes: 0.61 10*3/uL — ABNORMAL HIGH (ref 0.00–0.07)
Basophils Absolute: 0 10*3/uL (ref 0.0–0.1)
Basophils Relative: 0 %
Eosinophils Absolute: 0.2 10*3/uL (ref 0.0–0.5)
Eosinophils Relative: 2 %
Immature Granulocytes: 6 %
Lymphocytes Relative: 14 %
Lymphs Abs: 1.4 10*3/uL (ref 0.7–4.0)
Monocytes Absolute: 0.9 10*3/uL (ref 0.1–1.0)
Monocytes Relative: 9 %
Neutro Abs: 6.8 10*3/uL (ref 1.7–7.7)
Neutrophils Relative %: 69 %

## 2018-08-05 LAB — COMPREHENSIVE METABOLIC PANEL
ALT: 17 U/L (ref 0–44)
AST: 15 U/L (ref 15–41)
Albumin: 1.6 g/dL — ABNORMAL LOW (ref 3.5–5.0)
Alkaline Phosphatase: 64 U/L (ref 38–126)
Anion gap: 7 (ref 5–15)
BUN: 19 mg/dL (ref 8–23)
CO2: 27 mmol/L (ref 22–32)
Calcium: 7.5 mg/dL — ABNORMAL LOW (ref 8.9–10.3)
Chloride: 98 mmol/L (ref 98–111)
Creatinine, Ser: 0.45 mg/dL (ref 0.44–1.00)
GFR calc Af Amer: >60 mL/min (ref >60)
GFR calc non Af Amer: >60 mL/min (ref >60)
Glucose, Bld: 116 mg/dL — ABNORMAL HIGH (ref 70–99)
Potassium: 3.2 mmol/L — ABNORMAL LOW (ref 3.5–5.1)
Sodium: 132 mmol/L — ABNORMAL LOW (ref 135–145)
Total Bilirubin: 0.4 mg/dL (ref 0.3–1.2)
Total Protein: 4.3 g/dL — ABNORMAL LOW (ref 6.5–8.1)

## 2018-08-05 LAB — MAGNESIUM: Magnesium: 1.9 mg/dL (ref 1.7–2.4)

## 2018-08-05 LAB — PREALBUMIN: Prealbumin: 12.4 mg/dL — ABNORMAL LOW (ref 18–38)

## 2018-08-05 LAB — TRIGLYCERIDES: Triglycerides: 64 mg/dL (ref <150)

## 2018-08-05 LAB — PHOSPHORUS: Phosphorus: 3 mg/dL (ref 2.5–4.6)

## 2018-08-05 MED ORDER — POTASSIUM CHLORIDE 10 MEQ/50ML IV SOLN
10.0000 meq | INTRAVENOUS | Status: AC
  Administered 2018-08-05 (×4): 10 meq via INTRAVENOUS
  Filled 2018-08-05 (×4): qty 50

## 2018-08-05 MED ORDER — FUROSEMIDE 10 MG/ML IJ SOLN
80.0000 mg | Freq: Once | INTRAMUSCULAR | Status: AC
  Administered 2018-08-06: 80 mg via INTRAVENOUS
  Filled 2018-08-05: qty 8

## 2018-08-05 MED ORDER — JUVEN PO PACK
1.0000 | PACK | Freq: Two times a day (BID) | ORAL | Status: DC
  Administered 2018-08-05 – 2018-08-08 (×5): 1 via ORAL
  Filled 2018-08-05 (×5): qty 1

## 2018-08-05 MED ORDER — MAGNESIUM SULFATE 2 GM/50ML IV SOLN
2.0000 g | Freq: Once | INTRAVENOUS | Status: AC
  Administered 2018-08-05: 2 g via INTRAVENOUS
  Filled 2018-08-05: qty 50

## 2018-08-05 MED ORDER — POTASSIUM CHLORIDE CRYS ER 20 MEQ PO TBCR
30.0000 meq | EXTENDED_RELEASE_TABLET | Freq: Two times a day (BID) | ORAL | Status: DC
  Administered 2018-08-05 – 2018-08-08 (×7): 30 meq via ORAL
  Filled 2018-08-05 (×7): qty 1

## 2018-08-05 MED ORDER — ENSURE ENLIVE PO LIQD
237.0000 mL | Freq: Three times a day (TID) | ORAL | Status: DC
  Administered 2018-08-05 – 2018-08-07 (×5): 237 mL via ORAL

## 2018-08-05 NOTE — Progress Notes (Signed)
Physical Therapy Treatment Patient Details Name: Rachel Park MRN: [id number] DOB: 1931-11-13 Today's Date: 08/05/2018    History of Present Illness 89 yof with SBO, failed medical management, taken to OR with findings of necrotic bowel s/p resection.  Abd left open with wound vac and returns to ICU on mechanical ventilation. Extubated 07/25/18    PT Comments    Patient seen for mobility progression. Pt requires min/mod A for bed mobility and min guard/min A for gait training. Current plan remains appropriate.   Follow Up Recommendations  CIR     Equipment Recommendations  Rolling walker with 5" wheels    Recommendations for Other Services       Precautions / Restrictions Precautions Precautions: Fall Precaution Comments: abdominal incision. heart pillow in room to splint incision Restrictions Weight Bearing Restrictions: No    Mobility  Bed Mobility Overal bed mobility: Needs Assistance Bed Mobility: Supine to Sit;Sit to Sidelying;Rolling Rolling: Min guard   Supine to sit: Min assist;HOB elevated   Sit to sidelying: Mod assist General bed mobility comments: use of rail; HOB elevated when going from supine to sit; assistance to elevate trunk into sitting and then to bring bilat LE into bed to decrease strain on abdominals  Transfers Overall transfer level: Needs assistance Equipment used: Rolling walker (2 wheeled) Transfers: Sit to/from Stand Sit to Stand: Min guard         General transfer comment: Cues for hand placement to and from seated surface.    Ambulation/Gait Ambulation/Gait assistance: Min assist;Min guard Gait Distance (Feet): 120 Feet Assistive device: Rolling walker (2 wheeled) Gait Pattern/deviations: Step-through pattern;Decreased stride length;Trunk flexed;Shuffle Gait velocity: decreased   General Gait Details: cues for posture, increased stride length, and maintaining safe proximity to [employer]  Mobility    Modified Rankin (Stroke Patients Only)       Balance Overall balance assessment: Needs assistance Sitting-balance support: No upper extremity supported;Feet supported Sitting balance-Leahy Scale: Fair     Standing balance support: Bilateral upper extremity supported Standing balance-Leahy Scale: Poor Standing balance comment: pt is able to static stand without UE support but requires UE support for dynamic tasks/gait                            Cognition Arousal/Alertness: Awake/alert Behavior During Therapy: WFL for tasks assessed/performed Overall Cognitive Status: Within Functional Limits for tasks assessed Area of Impairment: Memory                     Memory: Decreased short-term memory                Exercises      General Comments        Pertinent Vitals/Pain Pain Assessment: Faces Faces Pain Scale: Hurts a little bit Pain Location: abdomen Pain Descriptors / Indicators: Guarding;Discomfort Pain Intervention(s): Limited activity within patient's tolerance;Monitored during session;Repositioned    Home Living                      Prior Function            PT Goals (current goals can now be found in the care plan section) Acute Rehab PT Goals Patient Stated Goal: get back to independent, meet my great grandson Progress towards PT goals: Progressing toward goals    Frequency    Min 3X/week  PT Plan Current plan remains appropriate    Co-evaluation              AM-PAC PT "6 Clicks" Mobility   Outcome Measure  Help needed turning from your back to your side while in a flat bed without using bedrails?: A Lot Help needed moving from lying on your back to sitting on the side of a flat bed without using bedrails?: A Lot Help needed moving to and from a bed to a chair (including a wheelchair)?: A Little Help needed standing up from a chair using your arms (e.g., wheelchair or bedside chair)?: A  Little Help needed to walk in hospital room?: A Little Help needed climbing 3-5 steps with a railing? : A Little 6 Click Score: 16    End of Session   Activity Tolerance: Patient tolerated treatment well Patient left: in bed;with call bell/phone within reach;with nursing/sitter in room Nurse Communication: Mobility status PT Visit Diagnosis: Muscle weakness (generalized) (M62.81)     Time: 1430-1456 PT Time Calculation (min) (ACUTE ONLY): 26 min  Charges:  $Gait Training: 8-22 mins $Therapeutic Activity: 8-22 mins                     Earney Navy, PTA Acute Rehabilitation Services Pager: [phone] Office: [phone]     Darliss Cheney 08/05/2018, 3:08 PM

## 2018-08-05 NOTE — Progress Notes (Signed)
Progress Note  Patient Name: Rachel Park Date of Encounter: 08/05/2018  Primary Cardiologist: Mertie Moores, MD   Subjective   85 yo with hx of persistent atrial fib,  Admitted with ischemic bowel,  She has had bowel resection. Had poor wound healing  Has been on TPN and has become fluid overloaded.- diastolic dysfunction   Is diuresing very well   Inpatient Medications    Scheduled Meds: . acetaminophen  1,000 mg Oral Q6H  . apixaban  5 mg Oral BID  . chlorhexidine  15 mL Mouth Rinse BID  . Chlorhexidine Gluconate Cloth  6 each Topical Daily  . diltiazem  180 mg Oral Daily  . feeding supplement  1 Container Oral TID BM  . insulin aspart  0-9 Units Subcutaneous Q6H  . levothyroxine  75 mcg Oral QAC breakfast  . lip balm  1 application Topical BID  . mouth rinse  15 mL Mouth Rinse q12n4p  . metoprolol tartrate  25 mg Oral BID  . pantoprazole  40 mg Oral Daily  . psyllium  1 packet Oral BID  . ramelteon  8 mg Oral QHS  . sodium chloride flush  10-40 mL Intracatheter Q12H  . sodium chloride flush  3 mL Intravenous Q12H   Continuous Infusions: . sodium chloride    . magnesium sulfate bolus IVPB    . piperacillin-tazobactam (ZOSYN)  IV 3.375 g (08/05/18 0618)  . potassium chloride    . TPN ADULT (ION) 70 mL/hr at 08/04/18 1730   PRN Meds: sodium chloride, alum & mag hydroxide-simeth, bismuth subsalicylate, guaiFENesin-dextromethorphan, hydrocortisone, hydrocortisone cream, menthol-cetylpyridinium, methocarbamol, ondansetron **OR** ondansetron (ZOFRAN) IV, oxyCODONE, phenol, sodium chloride flush, sodium chloride flush   Vital Signs    Vitals:   08/05/18 0614 08/05/18 0806 08/05/18 0806 08/05/18 0834  BP:  (!) 137/91    Pulse:    97  Resp:      Temp:   97.7 F (36.5 C)   TempSrc:   Oral   SpO2:  98%    Weight: 76 kg     Height:        Intake/Output Summary (Last 24 hours) at 08/05/2018 1030 Last data filed at 08/05/2018 0827 Gross per 24 hour  Intake 360 ml   Output 2200 ml  Net -1840 ml   Last 3 Weights 08/05/2018 08/04/2018 08/02/2018  Weight (lbs) 167 lb 8.8 oz 117 lb 11.6 oz 166 lb 3.6 oz  Weight (kg) 76 kg 53.4 kg 75.4 kg      Telemetry    Afib ,  With HR 90s - Personally Reviewed  ECG     - Personally Reviewed  Physical Exam   GEN:  elderly female,   NAD   Neck: No JVD Cardiac:  Irreg. Irreg.  Respiratory: Clear to auscultation bilaterally. GI: Soft, nontender, non-distended  MS: 2-3 + edema in legs  Neuro:  Nonfocal  Psych: Normal affect   Labs    Chemistry Recent Labs  Lab 08/01/18 0335  08/03/18 0421 08/04/18 1530 08/05/18 0346  NA 131*   < > 129* 132* 132*  K 4.4   < > 4.3 3.7 3.2*  CL 96*   < > 97* 97* 98  CO2 25   < > 25 23 27   GLUCOSE 105*   < > 114* 120* 116*  BUN 22   < > 21 19 19   CREATININE 0.42*   < > 0.43* 0.48 0.45  CALCIUM 7.8*   < > 7.5* 7.8* 7.5*  PROT 4.6*  --   --   --  4.3*  ALBUMIN 1.8*  --   --   --  1.6*  AST 22  --   --   --  15  ALT 23  --   --   --  17  ALKPHOS 72  --   --   --  64  BILITOT 0.5  --   --   --  0.4  GFRNONAA >60   < > >60 >60 >60  GFRAA >60   < > >60 >60 >60  ANIONGAP 10   < > 7 12 7    < > = values in this interval not displayed.     Hematology Recent Labs  Lab 08/03/18 0421 08/04/18 1530 08/05/18 0346  WBC 16.7* 11.8* 9.9  RBC 3.08* 3.09* 2.86*  HGB 9.3* 9.3* 8.6*  HCT 28.1* 28.0* 26.0*  MCV 91.2 90.6 90.9  MCH 30.2 30.1 30.1  MCHC 33.1 33.2 33.1  RDW 15.3 15.3 15.5  PLT 416* 498* 473*    Cardiac EnzymesNo results for input(s): TROPONINI in the last 168 hours. No results for input(s): TROPIPOC in the last 168 hours.   BNPNo results for input(s): BNP, PROBNP in the last 168 hours.   DDimer No results for input(s): DDIMER in the last 168 hours.   Radiology    No results found.  Cardiac Studies      Patient Profile     85 y.o. female with AFib, Has developed diastolic dysfunction with volume overload .  Has been getting TPN   Assessment &  Plan    1.   Acute diastolic dysfunction:   While getting lots of volume with meds an TPN.   Is diuresing well - she is net negative 563 cc as of today . Will give another dose of IV lasix tomorrow.  She is eating well.  Hopefully, she can stop the TPN today  Increase potassium chloride   2.  Atrial fib:  HR is well controlled.   3.   Ischemic bowel:   Plans per surgery .  She is eating well as of this am  Hopefully we can DC TPN soon / ? Today       For questions or updates, please contact [facility] Please consult [email or web address] for contact info under        Signed, Mertie Moores, MD  08/05/2018, 10:30 AM

## 2018-08-05 NOTE — Progress Notes (Signed)
Pharmacy Antibiotic Note  Rachel Park is a 85 y.o. female admitted on 07/22/2018 with intra-abdominal infection.  Pharmacy has been consulted for Zosyn dosing - today is day 4. She is afebrile, WBC down to normal, and renal function is stable. No culture data.  Plan: Zosyn 3.375gm IV q8h (EI) Monitor for clinical course, LOT, and deescalation.  Height: 5' 2.5" (158.8 cm) Weight: 167 lb 8.8 oz (76 kg) IBW/kg (Calculated) : 51.25  Temp (24hrs), Avg:97.9 F (36.6 C), Min:97.4 F (36.3 C), Max:98.6 F (37 C)  Recent Labs  Lab 07/31/18 0916 08/01/18 0335 08/02/18 0359 08/03/18 0421 08/04/18 1530 08/05/18 0346  WBC 11.6*  --  16.7* 16.7* 11.8* 9.9  CREATININE  --  0.42* 0.34* 0.43* 0.48 0.45    Estimated Creatinine Clearance: 50.6 mL/min (by C-G formula based on SCr of 0.45 mg/dL).    Allergies  Allergen Reactions  . Anesthesia S-I-60 Nausea And Vomiting    "with hysterectomy years back had post op N/V"    Thank you for involving pharmacy in this patient's care.  Renold Genta, PharmD, BCPS Clinical Pharmacist Clinical phone for 08/05/2018 until 3p is [phone] 08/05/2018 9:28 AM  **Pharmacist phone directory can be found on [email or web address] listed under [place]**

## 2018-08-05 NOTE — Progress Notes (Addendum)
[facility] CONSULT NOTE   Pharmacy Consult for TPN Indication: Prolonged ileus  Patient Measurements: Height: 5' 2.5" (158.8 cm) Weight: 167 lb 8.8 oz (76 kg) IBW/kg (Calculated) : 51.25 TPN AdjBW (KG): 54.7 Body mass index is 30.16 kg/m. Usual Weight: 63-69kg  Assessment: Rachel Park is an 85yo female admitted with hx of SBO with N/V and epigastric pain on 5/18. Later taken to OR for exploratory laparotomy and found to have necrotic bowel with resection with open abdomen followed by closure/ileocecectomy/ileocolonic anastomosis. Surgery consulted pharmacy for TPN as would like patient to have bowel rest and expects prolonged ileus.  GI: s/p bowel resection with expectation of prolonged ileus as above; 5/23 NGT removed. 4 stools 5/31 on Psyllium. Pre-albumin 6.8 >>12.4. PPI po. Endo: BG < 140s on SSI Insulin requirements in the past 24 hours: 1 units Lytes: Na up 132, K down 3.2 on IV Lasix (goal > 4; supplemented 20 mEq x1), Mg 1.9 (goal >2) Renal:SCr 0.43; UOP 1 mL/kg/hr + several unmeasured instances.  Pulm: extubated on RA Cards: BP 120s/90s (improved) with HR 80-90s (Atrial fibrillation on Apixaban- Hgb 8.6). Diltiazem, Lopressor, IV Lasix.  PICC DVT - superficial and on AC - ok to continue PICC Volume up per cards; giving lasix Hepatobil:t bili 0.5, AST/ALT WNL Trigs 103>>64 Neuro: Pain score 2-7. Oxycodone.  ID: periop abx completed. WBC down to 9.9. Afebrile.   TPN Access: PICC line placed 5/19 TPN start date: 5/22 Nutritional Goals (per RD recommendation on 08/01/2018): Kcal: 1610-9604 Protein: 85-105 grams Fluid: >/= 1.5 L  Current Nutrition: TPN Soft Diet - 90-100% breakfast documented yesterday and today Resource Breeze - 3 yesterday  Plan:  Wean TPN to 1/2 rate then stop at 1600 per Dr. Redmond Pulling, Surgery.  Will discontinue TPN labs and further orders.   Give Magnesium 2g IV x1 Give KCl 15mEq IV x4.  Sloan Leiter, PharmD, BCPS,  BCCCP Clinical Pharmacist Please check AMION for all [place] numbers  08/05/2018 8:49 AM

## 2018-08-05 NOTE — Plan of Care (Signed)
  Problem: Pain Managment: Goal: General experience of comfort will improve Outcome: Progressing   Problem: Safety: Goal: Ability to remain free from injury will improve Outcome: Progressing   Problem: Clinical Measurements: Goal: Ability to maintain clinical measurements within normal limits will improve Outcome: Progressing Goal: Will remain free from infection Outcome: Progressing Goal: Diagnostic test results will improve Outcome: Progressing Goal: Respiratory complications will improve Outcome: Progressing Goal: Cardiovascular complication will be avoided Outcome: Progressing   

## 2018-08-05 NOTE — Care Management Important Message (Signed)
Important Message  Patient Details  Name: Rachel Park MRN: [id number] Date of Birth: [birth year]   Medicare Important Message Given:  Yes    Elzy Tomasello [address]ine Circle 08/05/2018, 3:56 PM

## 2018-08-05 NOTE — Progress Notes (Signed)
Patient request not to be disturb when asleep due to her having difficulty fallen asleep.

## 2018-08-05 NOTE — Progress Notes (Signed)
Nutrition Follow-up   RD working remotely.  DOCUMENTATION CODES:   Not applicable  INTERVENTION:  TPN wean per Pharmacy.  Provide Ensure Enlive po TID, each supplement provides 350 kcal and 20 grams of protein  Provide 1 packet Juven BID, each packet provides 90 calories, 2.5 grams of protein, 8 grams of carbohydrate, and 14 grams of amino acids; supplement contains CaHMB, glutamine, and arginine, to promote wound healing  Encourage adequate PO intake.   NUTRITION DIAGNOSIS:   Increased nutrient needs related to post-op healing as evidenced by estimated needs; ongoing  GOAL:   Patient will meet greater than or equal to 90% of their needs; progressing  MONITOR:   PO intake, Supplement acceptance, Weight trends, Labs, Skin, I & O's  REASON FOR ASSESSMENT:   Consult New TPN/TNA  ASSESSMENT:   Patient with PMH significant for CAD s/p cath, HTN, stroke, TIA, and SBO s/p hernia repair in 2018. Presents this admission small bowel obstruction with ischemic bowel.   5/19 - ex-lap resection,LOA, VAC placement for open abdomen 5/21 - s/p ileocecectomy with ileocolonic anastomosis, abdominal closure 5/22 - extubated 5/23- NGT d/c, transferred from ICU to PCU  Diet has been advanced to a soft diet this AM. Pt has been tolerating her liquid diet prior to advancement and has been consuming her Boost Breeze supplements. Plans to wean TPN to half rate today then stop at 1800. Per MD abdominal wound continues to further dehisce and become more necrotic. Possible plans to add wound vac today. RD to order additional oral nutritional supplements to aid in caloric and protein needs as well as in wound healing.   Labs and medications reviewed.   Diet Order:   Diet Order            DIET SOFT Room service appropriate? Yes; Fluid consistency: Thin  Diet effective now              EDUCATION NEEDS:   Not appropriate for education at this time  Skin:  Skin Assessment: Skin Integrity  Issues: Skin Integrity Issues:: Incisions Wound Vac: d/c on 07/25/18 Incisions: abdomen dehisced  Last BM:  5/31  Height:   Ht Readings from Last 1 Encounters:  07/23/18 5' 2.5" (1.588 m)    Weight:   Wt Readings from Last 1 Encounters:  08/05/18 76 kg    Ideal Body Weight:  52.3 kg  BMI:  Body mass index is 30.16 kg/m.  Estimated Nutritional Needs:   Kcal:  1650-1850  Protein:  85-105 grams  Fluid:  >/= 1.5 L    Corrin Parker, MS, RD, LDN Pager # [phone] After hours/ weekend pager # [phone]

## 2018-08-05 NOTE — Progress Notes (Signed)
Patient ID: Rachel Park, female   DOB: Dec 24, 1931, 85 y.o.   MRN: [id number]   Acute Care Surgery Service Progress Note:    Chief Complaint/Subjective: Feeling better No n/v Still having some loose stools; occasional accidents   Objective: Vital signs in last 24 hours: Temp:  [97.4 F (36.3 C)-98.6 F (37 C)] 97.7 F (36.5 C) (06/01 0806) Pulse Rate:  [88-97] 97 (06/01 0834) Resp:  [23] 23 (05/31 1555) BP: (137)/(91-97) 137/91 (06/01 0806) SpO2:  [98 %-99 %] 98 % (06/01 0806) Weight:  [76 kg] 76 kg (06/01 0614) Last BM Date: 08/04/18  Intake/Output from previous day: 05/31 0701 - 06/01 0700 In: 860 [P.O.:260; I.V.:550; IV Piggyback:50] Out: 1900 [Urine:1900] Intake/Output this shift: Total I/O In: 240 [P.O.:240] Out: 1500 [Urine:1500]  Lungs: cta, nonlabored  Cardiovascular: reg  Abd: soft, min TTP, open midline incision/wound with dehis, fascial sutures visible  Extremities: mild edema, +SCDs  Neuro: alert, nonfocal  Lab Results: CBC  Recent Labs    08/04/18 1530 08/05/18 0346  WBC 11.8* 9.9  HGB 9.3* 8.6*  HCT 28.0* 26.0*  PLT 498* 473*   BMET Recent Labs    08/04/18 1530 08/05/18 0346  NA 132* 132*  K 3.7 3.2*  CL 97* 98  CO2 23 27  GLUCOSE 120* 116*  BUN 19 19  CREATININE 0.48 0.45  CALCIUM 7.8* 7.5*   LFT Hepatic Function Latest Ref Rng & Units 08/05/2018 08/01/2018 07/29/2018  Total Protein 6.5 - 8.1 g/dL 4.3(L) 4.6(L) 4.5(L)  Albumin 3.5 - 5.0 g/dL 1.6(L) 1.8(L) 1.9(L)  AST 15 - 41 U/L 15 22 13(L)  ALT 0 - 44 U/L 17 23 20   Alk Phosphatase 38 - 126 U/L 64 72 50  Total Bilirubin 0.3 - 1.2 mg/dL 0.4 0.5 0.8  Bilirubin, Direct 0.00 - 0.40 mg/dL - - -   PT/INR No results for input(s): LABPROT, INR in the last 72 hours. ABG No results for input(s): PHART, HCO3 in the last 72 hours.  Invalid input(s): PCO2, PO2  Studies/Results:  Anti-infectives: Anti-infectives (From admission, onward)   Start     Dose/Rate Route Frequency Ordered  Stop   08/02/18 1600  piperacillin-tazobactam (ZOSYN) IVPB 3.375 g     3.375 g 12.5 mL/hr over 240 Minutes Intravenous Every 8 hours 08/02/18 1554     07/25/18 1000  ceFAZolin (ANCEF) IVPB 2g/100 mL premix     2 g 200 mL/hr over 30 Minutes Intravenous To Short Stay 07/25/18 0749 07/25/18 1200   07/25/18 0745  ceFAZolin (ANCEF) IVPB 2g/100 mL premix  Status:  Discontinued     2 g 200 mL/hr over 30 Minutes Intravenous  Once 07/25/18 0735 07/25/18 0749   07/23/18 1000  cefoTEtan (CEFOTAN) 1 g in sodium chloride 0.9 % 100 mL IVPB     1 g 200 mL/hr over 30 Minutes Intravenous  Once 07/23/18 0825 07/23/18 1117      Medications: Scheduled Meds:  acetaminophen  1,000 mg Oral Q6H   apixaban  5 mg Oral BID   chlorhexidine  15 mL Mouth Rinse BID   Chlorhexidine Gluconate Cloth  6 each Topical Daily   diltiazem  180 mg Oral Daily   feeding supplement (ENSURE ENLIVE)  237 mL Oral TID BM   [START ON 08/06/2018] furosemide  80 mg Intravenous Once   levothyroxine  75 mcg Oral QAC breakfast   lip balm  1 application Topical BID   mouth rinse  15 mL Mouth Rinse q12n4p   metoprolol tartrate  25 mg Oral BID   nutrition supplement (JUVEN)  1 packet Oral BID BM   pantoprazole  40 mg Oral Daily   potassium chloride  30 mEq Oral BID   psyllium  1 packet Oral BID   ramelteon  8 mg Oral QHS   sodium chloride flush  10-40 mL Intracatheter Q12H   sodium chloride flush  3 mL Intravenous Q12H   Continuous Infusions:  sodium chloride     piperacillin-tazobactam (ZOSYN)  IV 3.375 g (08/05/18 1345)   TPN ADULT (ION) 35 mL/hr at 08/05/18 1205   PRN Meds:.sodium chloride, alum & mag hydroxide-simeth, bismuth subsalicylate, guaiFENesin-dextromethorphan, hydrocortisone, hydrocortisone cream, menthol-cetylpyridinium, methocarbamol, ondansetron **OR** ondansetron (ZOFRAN) IV, oxyCODONE, phenol, sodium chloride flush, sodium chloride flush  Assessment/Plan: Patient Active Problem List    Diagnosis Date Noted   Acute deep vein thrombosis (DVT) of upper extremity (HCC)    Ileus (HCC)    SBO (small bowel obstruction) ([place]) 07/23/2018   Small bowel obstruction ([place]) 07/22/2018   Atrial fibrillation ([place]) 05/20/2018   Renal infarct ([place])    Hyperlipidemia 11/27/2016   Bilateral inguinal hernia without obstruction or gangrene 03/07/2016   Incisional hernia, without obstruction or gangrene 03/07/2016   Persistent atrial fibrillation 06/21/2015   TIA (transient ischemic attack) 03/24/2015   CAD (coronary artery disease) 12/26/2014   COPD (chronic obstructive pulmonary disease) ([place]) 12/26/2014   Hypothyroidism 12/26/2014   Cerebral embolism with cerebral infarction 12/25/2014   Atrial fibrillation benign lesion-currently on Eliquis Hx CAD/acute diastolic CHF Right IJ for acute DVT- Xarelto per Dr. Monica Martinez Hypothyroid - on Synthroid Left pleural effusion Hx of hypercholesterolemia Protein calorie malnutrition-prealbumin 6.2>>6.8 (5/24)  Small bowel obstruction with ischemic bowel . Exploratory laparotomy, small bowel resection,511/19/2020, Dr. Rolm Bookbinder POD#13 Exploratory laparotomy, ileocecectomy with ileocolonic anastomosis, abdominal closure 07/25/2018,Dr. Rolm Bookbinder POD #11  FEN: TPN/soft diet ID: Zosyn 5/29 >>day 4 RAX:[id number] Follow up: Dr. Donne Hazel  Disposition: adv to soft diet this am, cont soft diet; do not renew TPN; cont current wound care - would NOT place a wound vac at this time - perhaps later on when there is much more granulation tissue.  -can stop zosyn in AM (no fever, no wbc, no abscess on CT) -I believe she can transition to inpt rehab on Tues/Wednesday (from an abdominal standpoint) -discussed with her son, Dr Donnetta Hutching  LOS: 54 days    Leighton Ruff. Redmond Pulling, MD, FACS General, Bariatric, & Minimally Invasive Surgery [phone] [facility]

## 2018-08-05 NOTE — Progress Notes (Signed)
Inpatient Rehabilitation-Admissions Coordinator   [facility] continues to follow for medical readiness. Will have to complete new insurance authorization process once medically ready. Will follow up tomorrow.   Please call if questions.   Jhonnie Garner, OTR/L  Rehab Admissions Coordinator  [phone] 08/05/2018 6:26 PM

## 2018-08-05 NOTE — Progress Notes (Signed)
Family Medicine Teaching Service Daily Progress Note Intern Pager: [phone]  Patient name: Rachel Park Medical record number: [id number] Date of birth: 07-25-1931 Age: 85 y.o. Gender: female  Primary Care Provider: Lajean Manes, MD Consultants: CCM (s/o), gen surg, cardiology, VVS Code Status: full  Pt Overview and Major Events to Date:  5/18 admitted to [facility] 5/19 ex lap, underwent colonic resection 5/21 underwent ileocolic anastomosis 2/95 extubated 5/24 out to floor, care resumed by FPTS 5/28 - worsening dehiscence of wound 5/29 - CT abd/CXR  Assessment and Plan: 85 year old female who presented with severe abdominal pain.  Found to have ischemic bowel on ex lap, bowel resected and underwent reanastomosis.  Has developed ileus postoperatively, extubated on 5/22 and transferred out of floor on 5/24.  Wound Dehiscence s/p ileocolic anastomosis  Wound continues to further dehisce and become more necrotic.  Large fluid collection within the anterior abdominal wall along the inferior margin of the incision.  Surgery started Zosyn on 5/29.  Patient's wound healing complicated by her poor nutritional status secondary to multiple surgeries and intubation and old age.  Receiving TPN pharmacy consult, appreciate their help.  Per surgery, patient advanced to full liquids on 5/31 and will continue with TPN until on soft diet. Possibly adding wound vac today.   -Surgery following appreciate recs -TPN, pharmacy to dose, currently 70 mL/h - electrolyte repletion per pharmacy -Continue Zosyn, pharmacy to dose -Abdominal binder when ambulating -3 times daily when dressing changes per surgery -Oxycodone 2.5-5mg  every 4 hours as needed -Tylenol 1 g every 6 hours -Discharge to CIR when appropriate -Robaxin 500 mg every 8 hours as needed per surgery  Possible Ileus, resolving Patient with small bowel dilatation proximal to repair site on CT abdomen pelvis from 5/29.  Read as possible ileus. 4 BM  5/31. - ADAT: full liquid  Volume overload Volume overload likely secondary to a number of factors including poor blood protein and receiving a lot of fluid during the admission.  Received Lasix 80 mg IV x1 on 5/31 with UOP 1.9 L per cardiology recs.  Planning to receive again this AM. -IV Lasix 80 mg x 1 -Cardiology following, appreciate recommendations  Hypokalemia K 3.2, likely 2/2 diuresis. - received 20mg  K-Dur x1 - f/u BMP  Atrial fibrillation Remains rate controlled.  Appreciate cardiology recommendations.  On apixaban 5 mg twice daily.  On diltiazem 24-hour capsule 180 mg, Lopressor 25 mg twice daily. -Continue Cardizem CD 100 mg daily, metoprolol 25 mg twice daily -Continue apixaban 5 mg twice daily -Daily BMP -Appreciate cardiology recommendations -Likely candidate for cardioversion after recovers from inpatient ailments  Right IJ and right brachial DVT Likely catheter associated as she has had a right IJ catheter with tip terminating right subclavian vein.  Vascular surgery was consulted for this, appreciate recommendations.  Recommendations are that given the placement of the DVT and that she is already on Eliquis 5 mg twice daily, no medication changes are necessary and the catheter can stay in.  Insomnia - pt having difficulty sleeping, likely from combination of pain and anxiety.  Patient states that she was able to sleep overnight. - Ramelteon qhs - encourage out of bed    Small left pleural effusion  Likely secondary to atelectasis.  No progression from last x-ray.  We will continue to monitor for fever or leukocytosis.   - incentive spirometry -Diuresis per above  Hypothyroidism -  Pt takes 26mcg synthroid at home daily.  - continue home med  Pseudo-hypocalcemia Calcium 7.5,  albumin 1.6 on CMP from 6/1.  Corrects to 9.4, within normal range - has been stable, monitor w/ BMP  Normocytic anemia 9.3>8.6, MCV 90.9.  Likely secondary to blood loss and poor  nutrition we will continue to follow with CBC.  FEN/GI:  Full liquid diet PPx: Pantoprazole 40 mg, Eliquis 5 mg twice daily  Disposition: likely CIR  Subjective:  Patient states that she was able to sleep overnight.  She states that she is tolerating her full liquid diet well.  She denies any other complaints this morning.  Objective: Temp:  [97.4 F (36.3 C)-98.6 F (37 C)] 97.8 F (36.6 C) (06/01 0348) Pulse Rate:  [85-108] 88 (05/31 1555) Resp:  [17-27] 23 (05/31 1555) BP: (116-137)/(69-97) 137/97 (05/31 1555) SpO2:  [95 %-99 %] 99 % (05/31 1555) Weight:  [76 kg] 76 kg (06/01 3299)  Physical Exam:  General: 85 y.o. female in NAD Cardio: irregular Lungs: CTAB, no wheezing, no rhonchi, no crackles, no IWOB on RA Abdomen: midline abdominal wound, see surgery note  Skin: warm and dry Extremities: 1+ pitting edema BLE     Laboratory: Recent Labs  Lab 08/03/18 0421 08/04/18 1530 08/05/18 0346  WBC 16.7* 11.8* 9.9  HGB 9.3* 9.3* 8.6*  HCT 28.1* 28.0* 26.0*  PLT 416* 498* 473*   Recent Labs  Lab 08/01/18 0335  08/03/18 0421 08/04/18 1530 08/05/18 0346  NA 131*   < > 129* 132* 132*  K 4.4   < > 4.3 3.7 3.2*  CL 96*   < > 97* 97* 98  CO2 25   < > 25 23 27   BUN 22   < > 21 19 19   CREATININE 0.42*   < > 0.43* 0.48 0.45  CALCIUM 7.8*   < > 7.5* 7.8* 7.5*  PROT 4.6*  --   --   --  4.3*  BILITOT 0.5  --   --   --  0.4  ALKPHOS 72  --   --   --  64  ALT 23  --   --   --  17  AST 22  --   --   --  15  GLUCOSE 105*   < > 114* 120* 116*   < > = values in this interval not displayed.   No results found.  Ayse Mccartin, Bernita Raisin, DO 08/05/2018, 7:52 AM PGY-1, [place] Intern pager: [phone], text pages welcome

## 2018-08-06 DIAGNOSIS — I5033 Acute on chronic diastolic (congestive) heart failure: Secondary | ICD-10-CM

## 2018-08-06 LAB — BASIC METABOLIC PANEL
Anion gap: 8 (ref 5–15)
BUN: 18 mg/dL (ref 8–23)
CO2: 26 mmol/L (ref 22–32)
Calcium: 8 mg/dL — ABNORMAL LOW (ref 8.9–10.3)
Chloride: 98 mmol/L (ref 98–111)
Creatinine, Ser: 0.57 mg/dL (ref 0.44–1.00)
GFR calc Af Amer: >60 mL/min (ref >60)
GFR calc non Af Amer: >60 mL/min (ref >60)
Glucose, Bld: 124 mg/dL — ABNORMAL HIGH (ref 70–99)
Potassium: 3.8 mmol/L (ref 3.5–5.1)
Sodium: 132 mmol/L — ABNORMAL LOW (ref 135–145)

## 2018-08-06 LAB — GLUCOSE, CAPILLARY
Glucose-Capillary: 82 mg/dL (ref 70–99)
Glucose-Capillary: 90 mg/dL (ref 70–99)
Glucose-Capillary: 101 mg/dL — ABNORMAL HIGH (ref 70–99)
Glucose-Capillary: 84 mg/dL (ref 70–99)

## 2018-08-06 LAB — CBC
HCT: 28 % — ABNORMAL LOW (ref 36.0–46.0)
Hemoglobin: 9.3 g/dL — ABNORMAL LOW (ref 12.0–15.0)
MCH: 30.2 pg (ref 26.0–34.0)
MCHC: 33.2 g/dL (ref 30.0–36.0)
MCV: 90.9 fL (ref 80.0–100.0)
Platelets: 578 10*3/uL — ABNORMAL HIGH (ref 150–400)
RBC: 3.08 MIL/uL — ABNORMAL LOW (ref 3.87–5.11)
RDW: 15.7 % — ABNORMAL HIGH (ref 11.5–15.5)
WBC: 12 10*3/uL — ABNORMAL HIGH (ref 4.0–10.5)
nRBC: 0 % (ref 0.0–0.2)

## 2018-08-06 LAB — MAGNESIUM: Magnesium: 1.9 mg/dL (ref 1.7–2.4)

## 2018-08-06 MED ORDER — FUROSEMIDE 10 MG/ML IJ SOLN
INTRAMUSCULAR | Status: AC
  Administered 2018-08-06: 80 mg via INTRAVENOUS
  Filled 2018-08-06: qty 8

## 2018-08-06 MED ORDER — FUROSEMIDE 10 MG/ML IJ SOLN
40.0000 mg | Freq: Two times a day (BID) | INTRAMUSCULAR | Status: DC
  Administered 2018-08-06 – 2018-08-07 (×2): 40 mg via INTRAVENOUS
  Filled 2018-08-06 (×2): qty 4

## 2018-08-06 MED ORDER — ALTEPLASE 2 MG IJ SOLR
2.0000 mg | Freq: Once | INTRAMUSCULAR | Status: DC
  Filled 2018-08-06 (×2): qty 2

## 2018-08-06 MED ORDER — ALTEPLASE 2 MG IJ SOLR
2.0000 mg | Freq: Once | INTRAMUSCULAR | Status: AC
  Administered 2018-08-06: 2 mg
  Filled 2018-08-06 (×2): qty 2

## 2018-08-06 MED ORDER — LOPERAMIDE HCL 2 MG PO CAPS
2.0000 mg | ORAL_CAPSULE | Freq: Every day | ORAL | Status: DC
  Administered 2018-08-06 – 2018-08-08 (×3): 2 mg via ORAL
  Filled 2018-08-06 (×4): qty 1

## 2018-08-06 NOTE — Progress Notes (Addendum)
Progress Note  Patient Name: Rachel Park Date of Encounter: 08/06/2018  Primary Cardiologist: Mertie Moores, MD   Subjective   Still somewhat SOB, not back to baseline yet. Mild abdominal tenderness.  Inpatient Medications    Scheduled Meds: . acetaminophen  1,000 mg Oral Q6H  . alteplase  2 mg Intracatheter Once  . apixaban  5 mg Oral BID  . chlorhexidine  15 mL Mouth Rinse BID  . Chlorhexidine Gluconate Cloth  6 each Topical Daily  . diltiazem  180 mg Oral Daily  . feeding supplement (ENSURE ENLIVE)  237 mL Oral TID BM  . furosemide  80 mg Intravenous Once  . levothyroxine  75 mcg Oral QAC breakfast  . lip balm  1 application Topical BID  . mouth rinse  15 mL Mouth Rinse q12n4p  . metoprolol tartrate  25 mg Oral BID  . nutrition supplement (JUVEN)  1 packet Oral BID BM  . pantoprazole  40 mg Oral Daily  . potassium chloride  30 mEq Oral BID  . psyllium  1 packet Oral BID  . ramelteon  8 mg Oral QHS  . sodium chloride flush  10-40 mL Intracatheter Q12H  . sodium chloride flush  3 mL Intravenous Q12H   Continuous Infusions: . sodium chloride    . piperacillin-tazobactam (ZOSYN)  IV 3.375 g (08/06/18 0713)   PRN Meds: sodium chloride, alum & mag hydroxide-simeth, bismuth subsalicylate, guaiFENesin-dextromethorphan, hydrocortisone, hydrocortisone cream, menthol-cetylpyridinium, methocarbamol, ondansetron **OR** ondansetron (ZOFRAN) IV, oxyCODONE, phenol, sodium chloride flush, sodium chloride flush   Vital Signs    Vitals:   08/06/18 0154 08/06/18 0300 08/06/18 0433 08/06/18 0830  BP:   (!) 142/98 (!) 131/91  Pulse: (!) 101  86 (!) 106  Resp: (!) 25  (!) 32 (!) 24  Temp:  97.9 F (36.6 C)  98.1 F (36.7 C)  TempSrc:  Oral  Oral  SpO2: 95%  96% 98%  Weight:  73 kg    Height:        Intake/Output Summary (Last 24 hours) at 08/06/2018 0849 Last data filed at 08/06/2018 0600 Gross per 24 hour  Intake 2520 ml  Output 1200 ml  Net 1320 ml   Last 3 Weights  08/06/2018 08/05/2018 08/04/2018  Weight (lbs) 160 lb 15 oz 167 lb 8.8 oz 117 lb 11.6 oz  Weight (kg) 73 kg 76 kg 53.4 kg      Telemetry    Atrial flutter HR 90-100s - Personally Reviewed  ECG    Atrial flutter 07/29/2018 - Personally Reviewed  Physical Exam   GEN: No acute distress.   Neck: No JVD Cardiac: irregular.  Respiratory: decreased breath sound in RLL GI: Soft, nontender, non-distended  MS: No edema; No deformity. Neuro:  Nonfocal  Psych: Normal affect   Labs    Chemistry Recent Labs  Lab 08/01/18 0335  08/03/18 0421 08/04/18 1530 08/05/18 0346  NA 131*   < > 129* 132* 132*  K 4.4   < > 4.3 3.7 3.2*  CL 96*   < > 97* 97* 98  CO2 25   < > 25 23 27   GLUCOSE 105*   < > 114* 120* 116*  BUN 22   < > 21 19 19   CREATININE 0.42*   < > 0.43* 0.48 0.45  CALCIUM 7.8*   < > 7.5* 7.8* 7.5*  PROT 4.6*  --   --   --  4.3*  ALBUMIN 1.8*  --   --   --  1.6*  AST 22  --   --   --  15  ALT 23  --   --   --  17  ALKPHOS 72  --   --   --  64  BILITOT 0.5  --   --   --  0.4  GFRNONAA >60   < > >60 >60 >60  GFRAA >60   < > >60 >60 >60  ANIONGAP 10   < > 7 12 7    < > = values in this interval not displayed.     Hematology Recent Labs  Lab 08/03/18 0421 08/04/18 1530 08/05/18 0346  WBC 16.7* 11.8* 9.9  RBC 3.08* 3.09* 2.86*  HGB 9.3* 9.3* 8.6*  HCT 28.1* 28.0* 26.0*  MCV 91.2 90.6 90.9  MCH 30.2 30.1 30.1  MCHC 33.1 33.2 33.1  RDW 15.3 15.3 15.5  PLT 416* 498* 473*    Cardiac EnzymesNo results for input(s): TROPONINI in the last 168 hours. No results for input(s): TROPIPOC in the last 168 hours.   BNPNo results for input(s): BNP, PROBNP in the last 168 hours.   DDimer No results for input(s): DDIMER in the last 168 hours.   Radiology    No results found.  Cardiac Studies   Echo 04/21/2018 IMPRESSIONS    1. The left ventricle has normal systolic function, with an ejection fraction of 55-60%. The cavity size was normal. Mild basal septal hypertrophy.  Left ventricular diastology could not be evaluated secondary to atrial fibrillation.  2. The right ventricle has normal systolic function. The cavity was normal. There is no increase in right ventricular wall thickness.  3. Left atrial size was moderately dilated.  4. The mitral valve is normal in structure.  5. The tricuspid valve is normal in structure. Tricuspid valve regurgitation is mild-moderate.  6. The aortic valve is tricuspid Mild sclerosis of the aortic valve.  7. The pulmonic valve was normal in structure. Pulmonic valve regurgitation is mild by color flow Doppler.  8. Right atrial pressure is estimated at 3 mmHg.   RUE doppler 08/02/2018 Summary:   Right: Findings consistent with acute deep vein thrombosis involving the right internal jugular veins and right brachial veins. Findings consistent with acute superficial vein thrombosis involving the right basilic vein. PICC line is visualized and free of thrombus.   Left: No evidence of thrombosis in the subclavian.   Patient Profile     85 y.o. female with PMH of atrial fibrillation, renal infarct (04/2018) when off anticoagulation and CAD (stent to LAD 2016) presented on 5/18 with severe abdominal pain and found to have ischemic bowel, underwent exp lap and small bowel resection on 5/19. Ileocecectomy with ileocolonic anastomosis with abdominal wall closure on 5/21. Cardiology initially consulted for management of atrial fibrillation. Patient later developed volume overload. US revealed RUE DVT which was felt to be related to catheter.   Assessment & Plan    1. Acute on chronic diastolic CHF: still has 2+ pitting edema, pending another dose of IV lasix today  2. Persistent atrial fibrillation: boderline rate controlled, will consider cardioversion once she recover from surgery. Eliquis restarted on 5/27.   3. Ischemic bowel s/p small bowel resection and ileocecetomy  4. Possible liver lesion: seen on CT of abdomen 5/18,  however this was not visualized on repeat CT on 5/29. Dr. Stanford Breed has recommended a abdominal US as outpatient, unclear if it is still needed.   5. Renal infarct in Feb 2020 when off anticoagulation  6. RUE DVT: noted on [place] 5/29. On anticoagulation. Felt to be related to catheter placement      For questions or updates, please contact [place] Please consult [email or web address] for contact info under        Signed, Almyra Deforest, [place]  08/06/2018, 8:49 AM    Attending Note:   The patient was seen and examined.  Agree with assessment and plan as noted above.  Changes made to the above note as needed.  Patient seen and independently examined with Almyra Deforest, PA .   We discussed all aspects of the encounter. I agree with the assessment and plan as stated above.  1.   Chronic diastolic CHF:  Still volume overloaded.   Is getting good urine output but has been getting so much IV fluids.   Hopefully, she will make some progress with diuresis now that the TPN has been stopped.  Continue lasix 40 mg BID ,  Supplement potassium     2.   Atrial fib:  HR is well controlled.   Continue eliquis - has been on eliquis since May 27.   She seems to do better in NSR.   Consider TEE / cardioversion next week if she improves .     3. Hypokalemia:   Have increased Kdur.  Check bmp today and daily for 2 days   4.  Disposition:  She is still volume overloaded.   I think she needs additional diuresis.   I've learned that the plan is for her to be discharged to CIR - this will allow [place] to be able to continue to follow her and make medication changes.      I have spent a total of 40 minutes with patient reviewing hospital  notes , telemetry, EKGs, labs and examining patient as well as establishing an assessment and plan that was discussed with the patient. > 50% of time was spent in direct patient care.    Thayer Headings, Brooke Bonito., MD, [facility] 08/06/2018, 10:06 AM [address],  [place] Pager [phone]

## 2018-08-06 NOTE — Progress Notes (Signed)
Patient refusing 1400 Juven and Ensure Enlive. Per patient, she is full from lunch and still sipping on 1000 Juven. Patient states "they prescribe me all these nutritional drinks but then have to give me lasix to get all the extra fluid off." Patient educated on importance on nutritional supplements but continues to refuse as she doesn't want to over do it. Will continue to monitor.    Hiram Comber, RN 08/06/2018 2:09 PM

## 2018-08-06 NOTE — Progress Notes (Signed)
Received call from IV team RN at [facility], no blood return therefore unable to draw labs; patient did not want to be stuck for lab draw. Informed IV team RN told per handoff DVT in right arm (PICC in right arm). IV team RN stated needs MD order for TPA to flush right PICC. Discussed need for clarity around continued use of PICC given DVT.   Paged MD poke with Tarry Kos at [phone]; informed IV team unable to draw labs. Inquired if ok to still run IV ABX and KVO fluids with known DVT in right arm; [facility] stated ok. Informed [facility] IV team needs order for TPA to flush picc that requires MD orders. Upon callback from [facility] stated will order TPA. Informed day RN to put in IV team consult once TPA arrives from pharmacy.   Wound dressing changed completed at 0645.

## 2018-08-06 NOTE — Progress Notes (Signed)
Physical Therapy Treatment Patient Details Name: Rachel Park MRN: [id number] DOB: [birth year] Today's Date: 08/06/2018    History of Present Illness 34 yof with SBO, failed medical management, taken to OR with findings of necrotic bowel s/p resection.  Abd left open with wound vac and returns to ICU on mechanical ventilation. Extubated 07/25/18    PT Comments    Patient seen for mobility progression. Pt is eager to participate in therapy and get back to PLOF. Pt presents with generalized weakness and decreased activity tolerance but is making progress toward PT goals and overall requires min guard/min A for OOB mobility. Pt will continue to benefit from further skilled PT services both acute and post acute to reach mod I/Independent level for safe return home alone.    Follow Up Recommendations  CIR     Equipment Recommendations  Rolling walker with 5" wheels    Recommendations for Other Services       Precautions / Restrictions Precautions Precautions: Fall Precaution Comments: abdominal incision. heart pillow in room to splint incision Restrictions Weight Bearing Restrictions: No    Mobility  Bed Mobility               General bed mobility comments: NA; pt OOB in chair upon arrival  Transfers Overall transfer level: Needs assistance Equipment used: Rolling walker (2 wheeled) Transfers: Sit to/from Stand Sit to Stand: Min assist         General transfer comment: assist to power up from lower surface; cues for safe hand placement  Ambulation/Gait Ambulation/Gait assistance: Min assist;Min guard Gait Distance (Feet): 140 Feet Assistive device: Rolling walker (2 wheeled) Gait Pattern/deviations: Step-through pattern;Decreased stride length;Trunk flexed;Shuffle Gait velocity: decreased   General Gait Details: grossly min guard for safety with gait; cues for safe use of AD especially when turning, upright posture, and increased stride length   Stairs              Wheelchair Mobility    Modified Rankin (Stroke Patients Only)       Balance Overall balance assessment: Needs assistance Sitting-balance support: No upper extremity supported;Feet supported Sitting balance-Leahy Scale: Fair     Standing balance support: Bilateral upper extremity supported Standing balance-Leahy Scale: Poor Standing balance comment: pt is able to static stand without UE support but requires UE support for dynamic tasks/gait                            Cognition Arousal/Alertness: Awake/alert Behavior During Therapy: WFL for tasks assessed/performed Overall Cognitive Status: Within Functional Limits for tasks assessed Area of Impairment: Memory                     Memory: Decreased short-term memory                Exercises      General Comments        Pertinent Vitals/Pain Pain Assessment: Faces Faces Pain Scale: Hurts a little bit Pain Location: abdomen Pain Descriptors / Indicators: Guarding;Discomfort Pain Intervention(s): Limited activity within patient's tolerance;Monitored during session;Repositioned    Home Living                      Prior Function            PT Goals (current goals can now be found in the care plan section) Acute Rehab PT Goals Patient Stated Goal: get back to independent, meet my great  grandson Progress towards PT goals: Progressing toward goals    Frequency    Min 3X/week      PT Plan Current plan remains appropriate    Co-evaluation              AM-PAC PT "6 Clicks" Mobility   Outcome Measure  Help needed turning from your back to your side while in a flat bed without using bedrails?: A Lot Help needed moving from lying on your back to sitting on the side of a flat bed without using bedrails?: A Lot Help needed moving to and from a bed to a chair (including a wheelchair)?: A Little Help needed standing up from a chair using your arms (e.g., wheelchair  or bedside chair)?: A Little Help needed to walk in hospital room?: A Little Help needed climbing 3-5 steps with a railing? : A Little 6 Click Score: 16    End of Session   Activity Tolerance: Patient tolerated treatment well Patient left: with call bell/phone within reach;in chair;Other (comment)(OT present end of session) Nurse Communication: Mobility status PT Visit Diagnosis: Muscle weakness (generalized) (M62.81)     Time: 6834-1962 PT Time Calculation (min) (ACUTE ONLY): 15 min  Charges:  $Gait Training: 8-22 mins                     Earney Navy, PTA Acute Rehabilitation Services Pager: [phone] Office: [phone]     Darliss Cheney 08/06/2018, 9:21 AM

## 2018-08-06 NOTE — Progress Notes (Signed)
Occupational Therapy Treatment Patient Details Name: Rachel Park MRN: [id number] DOB: 03-Jun-1931 Today's Date: 08/06/2018    History of present illness 71 yof with SBO, failed medical management, taken to OR with findings of necrotic bowel s/p resection.  Abd left open with wound vac and returns to ICU on mechanical ventilation. Extubated 07/25/18   OT comments  Pt progressing toward established goals. Pt currently requires minguard-minA for upper body ADL and maxA for LB ADL. Pt requires minguard-minA for functional mobility with use of RW. Maintained SpO2 >97% RA throughout session;HR 104-122 with brief spike 144 while completing ADL. Pt continues to require vc for energy conservation strategies during ADL. Pt will continue to benefit from skilled OT services to maximize safety and independence with ADL/IADL and functional mobility. Will continue to follow and progress as tolerated.    Follow Up Recommendations  CIR;Supervision/Assistance - 24 hour    Equipment Recommendations  Other (comment)(AE)    Recommendations for Other Services      Precautions / Restrictions Precautions Precautions: Fall Precaution Comments: abdominal incision. heart pillow in room to splint incision Restrictions Weight Bearing Restrictions: No       Mobility Bed Mobility               General bed mobility comments: NA; pt OOB in chair upon arrival  Transfers Overall transfer level: Needs assistance Equipment used: Rolling walker (2 wheeled) Transfers: Sit to/from Stand Sit to Stand: Min guard         General transfer comment: minguard for stability;vc for safe hand placement    Balance Overall balance assessment: Needs assistance Sitting-balance support: No upper extremity supported;Feet supported Sitting balance-Leahy Scale: Fair     Standing balance support: Single extremity supported;During functional activity Standing balance-Leahy Scale: Poor Standing balance comment: pt  demonstrated preference for single UE support during ADL task while standing at the sink;pt able to static stand without UE bur requires UE support for dynamic tasks                           ADL either performed or assessed with clinical judgement   ADL Overall ADL's : Needs assistance/impaired Eating/Feeding: Set up   Grooming: Oral care;Brushing hair;Wash/dry face;Min guard Grooming Details (indicate cue type and reason): standing at sink with available seat next to her for energy conservation;pt did not require rest break during ADL this session             Lower Body Dressing: Maximal assistance Lower Body Dressing Details (indicate cue type and reason): pt requires continued assistance for LB ADL due to abdominal wound and encouragement to NOT bend  Toilet Transfer: Min guard;Ambulation;RW Toilet Transfer Details (indicate cue type and reason): short in room distance Toileting- Clothing Manipulation and Hygiene: Min guard Toileting - Clothing Manipulation Details (indicate cue type and reason): minguard sit<>stand     Functional mobility during ADLs: Min guard;Minimal assistance;Rolling walker General ADL Comments: continued to need vc for energy conservation strategies during ADL;continued to reinforce energy conservation strategies due to abdominal wound to access LB for ADL     Vision   Vision Assessment?: No apparent visual deficits Additional Comments: wears glasses at all times;encouraged pt for family to bring her glasses to her during hospital stay   Perception     Praxis      Cognition Arousal/Alertness: Awake/alert Behavior During Therapy: WFL for tasks assessed/performed Overall Cognitive Status: Within Functional Limits for tasks assessed Area of  Impairment: Memory                     Memory: Decreased short-term memory         General Comments: pt with increased processing time        Exercises     Shoulder Instructions        General Comments tachy HR during ADL briefly 144, HR 104-122 throughout session with brief spike'SpO2 >97% RA throughout session;continued to reinforce general UB/LB exercises    Pertinent Vitals/ Pain       Pain Assessment: No/denies pain Faces Pain Scale: Hurts a little bit Pain Location: abdomen Pain Descriptors / Indicators: Guarding;Discomfort Pain Intervention(s): Monitored during session  Home Living                                          Prior Functioning/Environment              Frequency  Min 2X/week        Progress Toward Goals  OT Goals(current goals can now be found in the care plan section)  Progress towards OT goals: Progressing toward goals  Acute Rehab OT Goals Patient Stated Goal: to be independent again OT Goal Formulation: With patient Time For Goal Achievement: 08/15/18 Potential to Achieve Goals: Good ADL Goals Pt Will Perform Grooming: with modified independence;standing;sitting Pt Will Perform Upper Body Dressing: with modified independence;sitting Pt Will Perform Lower Body Dressing: with modified independence;with adaptive equipment;sit to/from stand Pt Will Transfer to Toilet: with modified independence;ambulating Pt Will Perform Toileting - Clothing Manipulation and hygiene: with modified independence;sit to/from stand;with adaptive equipment Additional ADL Goal #1: Pt will recall 3 energy conservation strategies for ADL at independent level  Plan Discharge plan remains appropriate    Co-evaluation                 AM-PAC OT "6 Clicks" Daily Activity     Outcome Measure   Help from another person eating meals?: A Little Help from another person taking care of personal grooming?: A Little Help from another person toileting, which includes using toliet, bedpan, or urinal?: A Lot Help from another person bathing (including washing, rinsing, drying)?: A Lot Help from another person to put on and taking  off regular upper body clothing?: A Little Help from another person to put on and taking off regular lower body clothing?: A Lot 6 Click Score: 15    End of Session Equipment Utilized During Treatment: Rolling walker  OT Visit Diagnosis: Unsteadiness on feet (R26.81);Other abnormalities of gait and mobility (R26.89);Muscle weakness (generalized) (M62.81);Pain   Activity Tolerance Patient tolerated treatment well   Patient Left in chair;with call bell/phone within reach;with nursing/sitter in room   Nurse Communication Mobility status        Time: 1191-4782 OT Time Calculation (min): 26 min  Charges: OT General Charges $OT Visit: 1 Visit OT Treatments $Self Care/Home Management : 23-37 mins  [place] Office: [place] 08/06/2018, 9:39 AM

## 2018-08-06 NOTE — Progress Notes (Signed)
Family Medicine Teaching Service Daily Progress Note Intern Pager: [phone]  Patient name: Rachel Park Medical record number: [id number] Date of birth: 09-19-1931 Age: 85 y.o. Gender: female  Primary Care Provider: Lajean Manes, MD Consultants: CCM (s/o), gen surg, cardiology, VVS Code Status: full  Pt Overview and Major Events to Date:  5/18 admitted to [facility] 5/19 ex lap, underwent colonic resection 5/21 underwent ileocolic anastomosis 0/24 extubated 5/24 out to floor, care resumed by FPTS 5/28 - worsening dehiscence of wound 5/29 - CT abd/CXR  Assessment and Plan: 85 year old female who presented with severe abdominal pain.  Found to have ischemic bowel on ex lap, bowel resected and underwent reanastomosis.  Has developed ileus postoperatively, extubated on 5/22 and transferred out of floor on 5/24.  Wound Dehiscence s/p ileocolic anastomosis  Patient's wound healing complicated by her poor nutritional status secondary to multiple surgeries and intubation and old age.  Receiving TPN pharmacy consult, appreciate their help.  Per surgery, patient advance to soft diet on 6/1.  Plan to continue TPN until she has adequate calorie intake with soft diet.  Surgery notes that they would not place a wound VAC now until she has more granulation tissue.  Per surgery, plan to discontinue Zosyn if patient well-appearing today. -Surgery following appreciate recs -TPN, pharmacy to dose, currently 70 mL/h, decrease as PO increases - electrolyte repletion per pharmacy -Continue Zosyn, likely d/c today -Abdominal binder when ambulating -3 times daily when dressing changes per surgery -Oxycodone 2.5-5mg  every 4 hours as needed -Tylenol 1 g every 6 hours -Discharge to CIR when appropriate -Robaxin 500 mg every 8 hours as needed per surgery  Possible Ileus, resolving Patient with small bowel dilatation proximal to repair site on CT abdomen pelvis from 5/29.  Read as possible ileus. 9 BMs on  6/1. - ADAT: soft diet  Volume overload Volume overload likely secondary to a number of factors including poor blood protein and receiving a lot of fluid during the admission.  Received IV Lasix 80 mg on 6/1 with UOP 1.5 L with 8 unmeasured urine occurrences.  Cardiology notes they plan for another dose of IV Lasix 80 mg today.  Weight down 3 kg from yesterday, 76-73. -IV Lasix 80 mg x1 today -Cardiology following, appreciate recommendations  Hypokalemia K 3.2> labs pending this a.m. due to PICC line malfunction -Receiving K-Dur 30 mEq twice daily - f/u BMP  Atrial fibrillation Borderline rate control, HR max overnight 121, this AM 86.  Appreciate cardiology recommendations.  On apixaban 5 mg twice daily.  On diltiazem 24-hour capsule 180 mg, Lopressor 25 mg twice daily. -Continue Cardizem CD 100 mg daily, metoprolol 25 mg twice daily -Continue apixaban 5 mg twice daily -Daily BMP -Appreciate cardiology recommendations -Likely candidate for cardioversion after recovers from inpatient ailments  Right IJ and right brachial DVT Likely catheter associated as she has had a right IJ catheter with tip terminating right subclavian vein.  Vascular surgery was consulted for this, appreciate recommendations.  Recommendations are that given the placement of the DVT and that she is already on Eliquis 5 mg twice daily, no medication changes are necessary and the catheter can stay in.  Insomnia: Improved Patient has been able to sleep with current regimen. - Ramelteon qhs - encourage out of bed    Small left pleural effusion  Likely secondary to atelectasis.  No progression from last x-ray.  We will continue to monitor for fever or leukocytosis.   - incentive spirometry -Diuresis per above  Hypothyroidism -  Pt takes 42mcg synthroid at home daily.  - continue home med  Pseudo-hypocalcemia Calcium 7.5, albumin 1.6 on CMP from 6/1.  Corrects to 9.4, within normal range - has been stable,  monitor w/ BMP  Normocytic anemia 9.3>8.6> pending this a.m. given PICC malfunction.  Likely secondary to blood loss and poor nutrition. -cont to monitor CBC  FEN/GI:  Soft diet PPx: Pantoprazole 40 mg, Eliquis 5 mg twice daily  Disposition: likely CIR  Subjective:  Patient reports that she did not sleep well overnight, but states "that is just how it is going to be for a little while."  She denies any other pain or complaints.  Objective: Temp:  [97.7 F (36.5 C)-98.8 F (37.1 C)] 97.9 F (36.6 C) (06/02 0300) Pulse Rate:  [86-111] 86 (06/02 0433) Resp:  [19-32] 32 (06/02 0433) BP: (105-142)/(73-98) 142/98 (06/02 0433) SpO2:  [95 %-98 %] 96 % (06/02 0433) Weight:  [73 kg] 73 kg (06/02 0300)  Physical Exam:  General: 85 y.o. female in NAD Cardio: Irregularly irregular Lungs: CTAB, no wheezing, no rhonchi, no crackles, no IWOB on RA Abdomen: Midline abdominal wound, see surgery note Skin: warm and dry Extremities: 1+ pitting edema   Laboratory: Recent Labs  Lab 08/03/18 0421 08/04/18 1530 08/05/18 0346  WBC 16.7* 11.8* 9.9  HGB 9.3* 9.3* 8.6*  HCT 28.1* 28.0* 26.0*  PLT 416* 498* 473*   Recent Labs  Lab 08/01/18 0335  08/03/18 0421 08/04/18 1530 08/05/18 0346  NA 131*   < > 129* 132* 132*  K 4.4   < > 4.3 3.7 3.2*  CL 96*   < > 97* 97* 98  CO2 25   < > 25 23 27   BUN 22   < > 21 19 19   CREATININE 0.42*   < > 0.43* 0.48 0.45  CALCIUM 7.8*   < > 7.5* 7.8* 7.5*  PROT 4.6*  --   --   --  4.3*  BILITOT 0.5  --   --   --  0.4  ALKPHOS 72  --   --   --  64  ALT 23  --   --   --  17  AST 22  --   --   --  15  GLUCOSE 105*   < > 114* 120* 116*   < > = values in this interval not displayed.   No results found.  Meccariello, Bernita Raisin, DO 08/06/2018, 7:40 AM PGY-1, [place] Intern pager: [phone], text pages welcome

## 2018-08-06 NOTE — Progress Notes (Addendum)
Called daughter Dolores Patty at [phone]. Informed plan is to d/c to inpt rehab, however, unsure of date. Informed wound dressing will be continued and surgical team follow in inpt rehab; no changes to wound care. Informed pain management with tylenol and oxy and premedicating prior to dressing change. Informed appetite is fair with mild discomfort with eating and getting nutrient dense drinks. Informed restlessness at night with 3-4 hours sleep but resting otherwise.  Wound dressing change completed at 0600.

## 2018-08-06 NOTE — Progress Notes (Signed)
Patient ID: Rachel Park, female   DOB: 03/30/1931, 85 y.o.   MRN: [id number]   Acute Care Surgery Service Progress Note:    Chief Complaint/Subjective: No n/v.  Having multiple loose stools Solids went well.   Objective: Vital signs in last 24 hours: Temp:  [97.8 F (36.6 C)-98.8 F (37.1 C)] 98.1 F (36.7 C) (06/02 0830) Pulse Rate:  [86-115] 106 (06/02 0830) Resp:  [19-32] 24 (06/02 0830) BP: (105-142)/(73-98) 131/91 (06/02 0830) SpO2:  [95 %-98 %] 98 % (06/02 0830) Weight:  [73 kg] 73 kg (06/02 0300) Last BM Date: 08/05/18(2-3 loose during day)  Intake/Output from previous day: 06/01 0701 - 06/02 0700 In: 2520 [P.O.:870; I.V.:1400; IV Piggyback:250] Out: 1500 [Urine:1500] Intake/Output this shift: No intake/output data recorded.  Lungs: cta, nonlabored  Cardiovascular: reg  Abd: soft, min TTP; open wound - see pic; dehisc, fibrinous exudate in base  Extremities:some edema, +SCDs  Neuro: alert, nonfocal    Lab Results: CBC  Recent Labs    08/04/18 1530 08/05/18 0346  WBC 11.8* 9.9  HGB 9.3* 8.6*  HCT 28.0* 26.0*  PLT 498* 473*   BMET Recent Labs    08/04/18 1530 08/05/18 0346  NA 132* 132*  K 3.7 3.2*  CL 97* 98  CO2 23 27  GLUCOSE 120* 116*  BUN 19 19  CREATININE 0.48 0.45  CALCIUM 7.8* 7.5*   LFT Hepatic Function Latest Ref Rng & Units 08/05/2018 08/01/2018 07/29/2018  Total Protein 6.5 - 8.1 g/dL 4.3(L) 4.6(L) 4.5(L)  Albumin 3.5 - 5.0 g/dL 1.6(L) 1.8(L) 1.9(L)  AST 15 - 41 U/L 15 22 13(L)  ALT 0 - 44 U/L 17 23 20   Alk Phosphatase 38 - 126 U/L 64 72 50  Total Bilirubin 0.3 - 1.2 mg/dL 0.4 0.5 0.8  Bilirubin, Direct 0.00 - 0.40 mg/dL - - -   PT/INR No results for input(s): LABPROT, INR in the last 72 hours. ABG No results for input(s): PHART, HCO3 in the last 72 hours.  Invalid input(s): PCO2, PO2  Studies/Results:  Anti-infectives: Anti-infectives (From admission, onward)   Start     Dose/Rate Route Frequency Ordered Stop   08/02/18 1600  piperacillin-tazobactam (ZOSYN) IVPB 3.375 g     3.375 g 12.5 mL/hr over 240 Minutes Intravenous Every 8 hours 08/02/18 1554     07/25/18 1000  ceFAZolin (ANCEF) IVPB 2g/100 mL premix     2 g 200 mL/hr over 30 Minutes Intravenous To Short Stay 07/25/18 0749 07/25/18 1200   07/25/18 0745  ceFAZolin (ANCEF) IVPB 2g/100 mL premix  Status:  Discontinued     2 g 200 mL/hr over 30 Minutes Intravenous  Once 07/25/18 0735 07/25/18 0749   07/23/18 1000  cefoTEtan (CEFOTAN) 1 g in sodium chloride 0.9 % 100 mL IVPB     1 g 200 mL/hr over 30 Minutes Intravenous  Once 07/23/18 0825 07/23/18 1117      Medications: Scheduled Meds: . acetaminophen  1,000 mg Oral Q6H  . alteplase  2 mg Intracatheter Once  . apixaban  5 mg Oral BID  . chlorhexidine  15 mL Mouth Rinse BID  . Chlorhexidine Gluconate Cloth  6 each Topical Daily  . diltiazem  180 mg Oral Daily  . feeding supplement (ENSURE ENLIVE)  237 mL Oral TID BM  . levothyroxine  75 mcg Oral QAC breakfast  . lip balm  1 application Topical BID  . mouth rinse  15 mL Mouth Rinse q12n4p  . metoprolol tartrate  25 mg  Oral BID  . nutrition supplement (JUVEN)  1 packet Oral BID BM  . pantoprazole  40 mg Oral Daily  . potassium chloride  30 mEq Oral BID  . psyllium  1 packet Oral BID  . ramelteon  8 mg Oral QHS  . sodium chloride flush  10-40 mL Intracatheter Q12H  . sodium chloride flush  3 mL Intravenous Q12H   Continuous Infusions: . sodium chloride    . piperacillin-tazobactam (ZOSYN)  IV 3.375 g (08/06/18 0713)   PRN Meds:.sodium chloride, alum & mag hydroxide-simeth, bismuth subsalicylate, guaiFENesin-dextromethorphan, hydrocortisone, hydrocortisone cream, menthol-cetylpyridinium, methocarbamol, ondansetron **OR** ondansetron (ZOFRAN) IV, oxyCODONE, phenol, sodium chloride flush, sodium chloride flush  Assessment/Plan: Patient Active Problem List   Diagnosis Date Noted  . Acute deep vein thrombosis (DVT) of upper extremity  ([place])   . Ileus ([place])   . SBO (small bowel obstruction) ([place]) 07/23/2018  . Small bowel obstruction ([place]) 07/22/2018  . Atrial fibrillation ([place]) 05/20/2018  . Renal infarct ([place])   . Hyperlipidemia 11/27/2016  . Bilateral inguinal hernia without obstruction or gangrene 03/07/2016  . Incisional hernia, without obstruction or gangrene 03/07/2016  . Persistent atrial fibrillation 06/21/2015  . TIA (transient ischemic attack) 03/24/2015  . CAD (coronary artery disease) 12/26/2014  . COPD (chronic obstructive pulmonary disease) ([place]) 12/26/2014  . Hypothyroidism 12/26/2014  . Cerebral embolism with cerebral infarction 12/25/2014   Atrial fibrillation benign lesion-currently on Eliquis Hx CAD/acute diastolic CHF Right IJ for acute DVT- Xarelto per Dr. Monica Martinez Hypothyroid - on Synthroid Left pleural effusion Hx of hypercholesterolemia Protein calorie malnutrition-prealbumin 6.2>>6.8 (5/24)>>12.4 (6/1)  Small bowel obstruction with ischemic bowel . Exploratory laparotomy, small bowel resection,511/19/2020, Dr. Rolm Bookbinder POD#14 Exploratory laparotomy, ileocecectomy with ileocolonic anastomosis, abdominal closure 07/25/2018,Dr. Rolm Bookbinder POD #11  FEN: soft diet ID: Zosyn 5/29 >>day5 KVT:[id number] Follow up: Dr. Donne Hazel  Disposition: stop zosyn today -no fever. No wbc. Has had 5 days.  Will add low dose imodium Can go to inpt rehab from my standpoint Cont current wound (mepital on base of wound, then moist gauze then abd pads/tape) Will follow while in inpt rehab   LOS: 15 days    Leighton Ruff. Redmond Pulling, MD, FACS General, Bariatric, & Minimally Invasive Surgery [phone] [facility]

## 2018-08-06 NOTE — Progress Notes (Signed)
Morning labs unable to be drawn due to PICC line not having blood return. TPA currently on unit. IV team notified.

## 2018-08-06 NOTE — Progress Notes (Signed)
Inpatient Rehabilitation-Admissions Coordinator   [facility] will begin insurance authorization for possible admit to CIR.   Please call if questions.   Jhonnie Garner, OTR/L  Rehab Admissions Coordinator  [phone] 08/06/2018 10:45 AM

## 2018-08-07 LAB — GLUCOSE, CAPILLARY
Glucose-Capillary: 88 mg/dL (ref 70–99)
Glucose-Capillary: 128 mg/dL — ABNORMAL HIGH (ref 70–99)
Glucose-Capillary: 106 mg/dL — ABNORMAL HIGH (ref 70–99)
Glucose-Capillary: 115 mg/dL — ABNORMAL HIGH (ref 70–99)

## 2018-08-07 LAB — CBC
HCT: 28.3 % — ABNORMAL LOW (ref 36.0–46.0)
Hemoglobin: 9.2 g/dL — ABNORMAL LOW (ref 12.0–15.0)
MCH: 29.7 pg (ref 26.0–34.0)
MCHC: 32.5 g/dL (ref 30.0–36.0)
MCV: 91.3 fL (ref 80.0–100.0)
Platelets: 618 10*3/uL — ABNORMAL HIGH (ref 150–400)
RBC: 3.1 MIL/uL — ABNORMAL LOW (ref 3.87–5.11)
RDW: 15.7 % — ABNORMAL HIGH (ref 11.5–15.5)
WBC: 12.3 10*3/uL — ABNORMAL HIGH (ref 4.0–10.5)
nRBC: 0 % (ref 0.0–0.2)

## 2018-08-07 LAB — BASIC METABOLIC PANEL
Anion gap: 12 (ref 5–15)
BUN: 18 mg/dL (ref 8–23)
CO2: 26 mmol/L (ref 22–32)
Calcium: 8.2 mg/dL — ABNORMAL LOW (ref 8.9–10.3)
Chloride: 95 mmol/L — ABNORMAL LOW (ref 98–111)
Creatinine, Ser: 0.62 mg/dL (ref 0.44–1.00)
GFR calc Af Amer: >60 mL/min (ref >60)
GFR calc non Af Amer: >60 mL/min (ref >60)
Glucose, Bld: 89 mg/dL (ref 70–99)
Potassium: 4.1 mmol/L (ref 3.5–5.1)
Sodium: 133 mmol/L — ABNORMAL LOW (ref 135–145)

## 2018-08-07 MED ORDER — TORSEMIDE 20 MG PO TABS
20.0000 mg | ORAL_TABLET | Freq: Two times a day (BID) | ORAL | Status: DC
  Administered 2018-08-07 – 2018-08-08 (×2): 20 mg via ORAL
  Filled 2018-08-07 (×2): qty 1

## 2018-08-07 NOTE — Consult Note (Signed)
   [facility] CM Inpatient Consult   08/07/2018  Rachel Park 03/20/1931 [id number]    Follow-up note:  Continue to follow patient for disposition/ needs as benefit from her [facility] plan and noted that current plan is still to discharge patient to Inpatient Rehab ([facility]) once bed is available, inorder to reach mod I/ Independent level for safe return to home- alone.   For questions and referral, please contact:  Rochanda Harpham A. Keerthana Vanrossum, BSN, RN-BC [facility] Liaison Cell: [phone]

## 2018-08-07 NOTE — Progress Notes (Addendum)
Progress Note  Patient Name: Rachel Park Date of Encounter: 08/07/2018  Primary Cardiologist: Mertie Moores, MD   Subjective   Overall feels much better. Asymptomatic w/ Afib. Rates currently ~107. Reports good UOP and states that her LEE has improved a great deal. No resting dyspnea or CP.   Inpatient Medications    Scheduled Meds: . acetaminophen  1,000 mg Oral Q6H  . apixaban  5 mg Oral BID  . chlorhexidine  15 mL Mouth Rinse BID  . Chlorhexidine Gluconate Cloth  6 each Topical Daily  . diltiazem  180 mg Oral Daily  . feeding supplement (ENSURE ENLIVE)  237 mL Oral TID BM  . furosemide  40 mg Intravenous BID  . levothyroxine  75 mcg Oral QAC breakfast  . lip balm  1 application Topical BID  . loperamide  2 mg Oral Daily  . mouth rinse  15 mL Mouth Rinse q12n4p  . metoprolol tartrate  25 mg Oral BID  . nutrition supplement (JUVEN)  1 packet Oral BID BM  . pantoprazole  40 mg Oral Daily  . potassium chloride  30 mEq Oral BID  . psyllium  1 packet Oral BID  . ramelteon  8 mg Oral QHS  . sodium chloride flush  10-40 mL Intracatheter Q12H  . sodium chloride flush  3 mL Intravenous Q12H   Continuous Infusions: . sodium chloride     PRN Meds: sodium chloride, alum & mag hydroxide-simeth, bismuth subsalicylate, guaiFENesin-dextromethorphan, hydrocortisone, hydrocortisone cream, menthol-cetylpyridinium, methocarbamol, ondansetron **OR** ondansetron (ZOFRAN) IV, oxyCODONE, phenol, sodium chloride flush, sodium chloride flush   Vital Signs    Vitals:   08/06/18 2304 08/06/18 2355 08/07/18 0355 08/07/18 0500  BP: (!) 139/91     Pulse: (!) 102     Resp:      Temp:  97.8 F (36.6 C) (!) 97.3 F (36.3 C)   TempSrc:  Oral Oral   SpO2: 98%     Weight:    69.2 kg  Height:        Intake/Output Summary (Last 24 hours) at 08/07/2018 0910 Last data filed at 08/07/2018 0000 Gross per 24 hour  Intake 1068.39 ml  Output 2650 ml  Net -1581.61 ml   Last 3 Weights 08/07/2018  08/06/2018 08/05/2018  Weight (lbs) 152 lb 8.9 oz 160 lb 15 oz 167 lb 8.8 oz  Weight (kg) 69.2 kg 73 kg 76 kg      Telemetry    Atrial fibrillation 107 bpm  - Personally Reviewed  ECG    Not performed today - Personally Reviewed  Physical Exam   GEN: elderly WF in No acute distress.   Neck: No JVD Cardiac: irregularly irregular rhythm, slightly tachy rate Respiratory: Clear to auscultation bilaterally. GI: Soft, nontender, non-distended  MS: trace LEE Neuro:  Nonfocal  Psych: Normal affect   Labs    Chemistry Recent Labs  Lab 08/01/18 0335  08/05/18 0346 08/06/18 1347 08/07/18 0406  NA 131*   < > 132* 132* 133*  K 4.4   < > 3.2* 3.8 4.1  CL 96*   < > 98 98 95*  CO2 25   < > 27 26 26   GLUCOSE 105*   < > 116* 124* 89  BUN 22   < > 19 18 18   CREATININE 0.42*   < > 0.45 0.57 0.62  CALCIUM 7.8*   < > 7.5* 8.0* 8.2*  PROT 4.6*  --  4.3*  --   --  ALBUMIN 1.8*  --  1.6*  --   --   AST 22  --  15  --   --   ALT 23  --  17  --   --   ALKPHOS 72  --  64  --   --   BILITOT 0.5  --  0.4  --   --   GFRNONAA >60   < > >60 >60 >60  GFRAA >60   < > >60 >60 >60  ANIONGAP 10   < > 7 8 12    < > = values in this interval not displayed.     Hematology Recent Labs  Lab 08/05/18 0346 08/06/18 1347 08/07/18 0406  WBC 9.9 12.0* 12.3*  RBC 2.86* 3.08* 3.10*  HGB 8.6* 9.3* 9.2*  HCT 26.0* 28.0* 28.3*  MCV 90.9 90.9 91.3  MCH 30.1 30.2 29.7  MCHC 33.1 33.2 32.5  RDW 15.5 15.7* 15.7*  PLT 473* 578* 618*    Cardiac EnzymesNo results for input(s): TROPONINI in the last 168 hours. No results for input(s): TROPIPOC in the last 168 hours.   BNPNo results for input(s): BNP, PROBNP in the last 168 hours.   DDimer No results for input(s): DDIMER in the last 168 hours.   Radiology    No results found.  Cardiac Studies   Echo 04/21/2018 IMPRESSIONS   1. The left ventricle has normal systolic function, with an ejection fraction of 55-60%. The cavity size was normal. Mild  basal septal hypertrophy. Left ventricular diastology could not be evaluated secondary to atrial fibrillation. 2. The right ventricle has normal systolic function. The cavity was normal. There is no increase in right ventricular wall thickness. 3. Left atrial size was moderately dilated. 4. The mitral valve is normal in structure. 5. The tricuspid valve is normal in structure. Tricuspid valve regurgitation is mild-moderate. 6. The aortic valve is tricuspid Mild sclerosis of the aortic valve. 7. The pulmonic valve was normal in structure. Pulmonic valve regurgitation is mild by color flow Doppler. 8. Right atrial pressure is estimated at 3 mmHg.   RUE doppler 08/02/2018 Summary:  Right: Findings consistent with acute deep vein thrombosis involving the right internal jugular veins and right brachial veins. Findings consistent with acute superficial vein thrombosis involving the right basilic vein. PICC line is visualized and free of thrombus.  Left: No evidence of thrombosis in the subclavian.  Patient Profile        85 y.o. female with PMH of atrial fibrillation, renal infarct (04/2018) when off anticoagulation and CAD (stent to LAD 2016) presented on 5/18 with severe abdominal pain and found to have ischemic bowel, underwent exp lap and small bowel resection on 5/19. Ileocecectomy with ileocolonic anastomosis with abdominal wall closure on 5/21. Cardiology initially consulted for management of atrial fibrillation. Patient later developed volume overload. US revealed RUE DVT which was felt to be related to catheter.   Assessment & Plan    1. Acute on chronic diastolic CHF: good UOP yesterday -2.6L out. Still + fluid balance but trending downward. Weight also trending downward from 160>>152 lb today. No resting dyspnea and pt reports significant improvement w/ less LEE today. Renal function and K stable. Transition to PO diuretics within the next 24 hrs. Continue daily weights,  strict I/Os and close monitoring of renal function, electrolytes and BP.   2. Persistent atrial fibrillation: boderline rate controlled. HR in the low 100s. Asymptomatic. Continue metoprolol and Cardizem. She has BP room for further titration of AV  nodal blocking agents. Continue Eliquis, restarted on 5/27.   3. Ischemic bowel: s/p small bowel resection and ileocecectomy. Management per general surgery.   4. Possible liver lesion: seen on CT of abdomen 5/18, however this was not visualized on repeat CT on 5/29. Dr. Stanford Breed has recommended a abdominal US as outpatient.  5. H/O Renal infarct: occured in Feb 2020 when off anticoagulation. Now on Eliquis for secondary prevention as well as for management of acute DVT.   6. RUE DVT: noted on [place] 5/29. On anticoagulation. Felt to be related to catheter placement  7. Hypokalemia: resolved. K has been WNL the last 2 days. Continue to monitor while diuresing.   8. Anemia: Likely secondary to blood loss from surgery and poor nutrition.  Stable at 9.2   For questions or updates, please contact [place] Please consult [email or web address] for contact info under        Signed, Lyda Jester, PA-C  08/07/2018, 9:10 AM     Attending Note:   The patient was seen and examined.  Agree with assessment and plan as noted above.  Changes made to the above note as needed.  Patient seen and independently examined with Lyda Jester, PA .   We discussed all aspects of the encounter. I agree with the assessment and plan as stated above.  1.  Acute on chronic diastolic CHF:   Finally diuresing .   Is net negative 1.5 liters .   Continue current lasix.   Consider changing to PO Torsemide tomorrow .   2. Atrial fib:  HR is well controlled. ,  Continue eliquis .   3.  Disposition:   She has improved.   The plan is to DC her to CIR.   I do think she is stable enough from a cardiac standpoint to go to CIR  Will change IV lasix to PO torsemide 20 mg  BID      I have spent a total of 40 minutes with patient reviewing hospital  notes , telemetry, EKGs, labs and examining patient as well as establishing an assessment and plan that was discussed with the patient. > 50% of time was spent in direct patient care.    Thayer Headings, Brooke Bonito., MD, [facility] 08/07/2018, 10:41 AM [address]  [place] Pager [phone]

## 2018-08-07 NOTE — Progress Notes (Signed)
Patient requested to keep PICC line in until discharge from facility. Patient states she is a difficult stick and upon assessment with the ultrasound the patient has very small veins, RN notified and MD will be sent a message.

## 2018-08-07 NOTE — Progress Notes (Signed)
Patient ID: Rachel Park, female   DOB: [birth year], 85 y.o.   MRN: [id number]   Acute Care Surgery Service Progress Note:    Chief Complaint/Subjective: No c/o. Son (Dr Early) at [facility].  Appetite ok Reports BM may be firming up a little  Objective: Vital signs in last 24 hours: Temp:  [97.3 F (36.3 C)-98.3 F (36.8 C)] 97.3 F (36.3 C) (06/03 0355) Pulse Rate:  [65-115] 102 (06/02 2304) BP: (111-139)/(74-91) 139/91 (06/02 2304) SpO2:  [96 %-98 %] 98 % (06/02 2304) Weight:  [69.2 kg] 69.2 kg (06/03 0500) Last BM Date: 08/07/18  Intake/Output from previous day: 06/02 0701 - 06/03 0700 In: 1308.4 [P.O.:1157; I.V.:10; IV Piggyback:141.4] Out: 2650 [Urine:2650] Intake/Output this shift: No intake/output data recorded.  Lungs: nonlabored  Cardiovascular: mild tachy  Abd: soft, nd; open midline wound with dehis; granulation tissue with exposed fascial sutures and fibrinous exudate (unchanged from yesterday)  Extremities: trace edema, +SCDs  Neuro: alert, nonfocal  Lab Results: CBC  Recent Labs    08/06/18 1347 08/07/18 0406  WBC 12.0* 12.3*  HGB 9.3* 9.2*  HCT 28.0* 28.3*  PLT 578* 618*   BMET Recent Labs    08/06/18 1347 08/07/18 0406  NA 132* 133*  K 3.8 4.1  CL 98 95*  CO2 26 26  GLUCOSE 124* 89  BUN 18 18  CREATININE 0.57 0.62  CALCIUM 8.0* 8.2*   LFT Hepatic Function Latest Ref Rng & Units 08/05/2018 08/01/2018 07/29/2018  Total Protein 6.5 - 8.1 g/dL 4.3(L) 4.6(L) 4.5(L)  Albumin 3.5 - 5.0 g/dL 1.6(L) 1.8(L) 1.9(L)  AST 15 - 41 U/L 15 22 13(L)  ALT 0 - 44 U/L 17 23 20   Alk Phosphatase 38 - 126 U/L 64 72 50  Total Bilirubin 0.3 - 1.2 mg/dL 0.4 0.5 0.8  Bilirubin, Direct 0.00 - 0.40 mg/dL - - -   PT/INR No results for input(s): LABPROT, INR in the last 72 hours. ABG No results for input(s): PHART, HCO3 in the last 72 hours.  Invalid input(s): PCO2, PO2  Studies/Results:  Anti-infectives: Anti-infectives (From admission, onward)   Start      Dose/Rate Route Frequency Ordered Stop   08/02/18 1600  piperacillin-tazobactam (ZOSYN) IVPB 3.375 g     3.375 g 12.5 mL/hr over 240 Minutes Intravenous Every 8 hours 08/02/18 1554 08/07/18 0306   07/25/18 1000  ceFAZolin (ANCEF) IVPB 2g/100 mL premix     2 g 200 mL/hr over 30 Minutes Intravenous To Short Stay 07/25/18 0749 07/25/18 1200   07/25/18 0745  ceFAZolin (ANCEF) IVPB 2g/100 mL premix  Status:  Discontinued     2 g 200 mL/hr over 30 Minutes Intravenous  Once 07/25/18 0735 07/25/18 0749   07/23/18 1000  cefoTEtan (CEFOTAN) 1 g in sodium chloride 0.9 % 100 mL IVPB     1 g 200 mL/hr over 30 Minutes Intravenous  Once 07/23/18 0825 07/23/18 1117      Medications: Scheduled Meds: . acetaminophen  1,000 mg Oral Q6H  . apixaban  5 mg Oral BID  . chlorhexidine  15 mL Mouth Rinse BID  . Chlorhexidine Gluconate Cloth  6 each Topical Daily  . diltiazem  180 mg Oral Daily  . feeding supplement (ENSURE ENLIVE)  237 mL Oral TID BM  . furosemide  40 mg Intravenous BID  . levothyroxine  75 mcg Oral QAC breakfast  . lip balm  1 application Topical BID  . loperamide  2 mg Oral Daily  . mouth rinse  15 mL Mouth Rinse q12n4p  . metoprolol tartrate  25 mg Oral BID  . nutrition supplement (JUVEN)  1 packet Oral BID BM  . pantoprazole  40 mg Oral Daily  . potassium chloride  30 mEq Oral BID  . psyllium  1 packet Oral BID  . ramelteon  8 mg Oral QHS  . sodium chloride flush  10-40 mL Intracatheter Q12H  . sodium chloride flush  3 mL Intravenous Q12H   Continuous Infusions: . sodium chloride     PRN Meds:.sodium chloride, alum & mag hydroxide-simeth, bismuth subsalicylate, guaiFENesin-dextromethorphan, hydrocortisone, hydrocortisone cream, menthol-cetylpyridinium, methocarbamol, ondansetron **OR** ondansetron (ZOFRAN) IV, oxyCODONE, phenol, sodium chloride flush, sodium chloride flush  Assessment/Plan: Patient Active Problem List   Diagnosis Date Noted  . Acute deep vein thrombosis (DVT)  of upper extremity ([place])   . Ileus ([place])   . SBO (small bowel obstruction) ([place]) 07/23/2018  . Small bowel obstruction ([place]) 07/22/2018  . Atrial fibrillation ([place]) 05/20/2018  . Renal infarct ([place])   . Hyperlipidemia 11/27/2016  . Bilateral inguinal hernia without obstruction or gangrene 03/07/2016  . Incisional hernia, without obstruction or gangrene 03/07/2016  . Persistent atrial fibrillation 06/21/2015  . TIA (transient ischemic attack) 03/24/2015  . CAD (coronary artery disease) 12/26/2014  . COPD (chronic obstructive pulmonary disease) ([place]) 12/26/2014  . Hypothyroidism 12/26/2014  . Cerebral embolism with cerebral infarction 12/25/2014   Atrial fibrillation benign lesion-currently on Eliquis Hx CAD/acute diastolic CHF Right IJ for acute DVT- Xarelto per Dr. Monica Martinez Hypothyroid - on Synthroid Left pleural effusion Hx of hypercholesterolemia Protein calorie malnutrition-prealbumin 6.2>>6.8 (5/24)>>12.4 (6/1)  Small bowel obstruction with ischemic bowel . Exploratory laparotomy, small bowel resection,511/19/2020, Dr. Rolm Bookbinder  Exploratory laparotomy, ileocecectomy with ileocolonic anastomosis, abdominal closure5/21/2020,Dr. Rolm Bookbinder   FEN: soft diet ID: Zosyn 5/29 >>day5 (d/c 6/2) [id number] Follow up: Dr. Donne Hazel  Disposition:abd is stable. Discussed protein shakes with pt. Can go to inpt rehab from my standpoint Cont current wound (mepital on base of wound, then moist gauze then abd pads/tape) Will follow while in inpt rehab  LOS: 16 days    Leighton Ruff. Redmond Pulling, MD, FACS General, Bariatric, & Minimally Invasive Surgery [phone] [facility]

## 2018-08-07 NOTE — Progress Notes (Signed)
Administered Tylenol and 2.5 mg oxycodone at 2323; RN at bedside when patient took medication. Upon return to bedside, oxy tablet still remained in patient's medication cup. Tylenol and oxy were given in same medication cup and is likely patient did not realize did not take small half (2.5 mg) half. Wasted half tablet in stericycle bin in med room with RN Elisa at 0800.

## 2018-08-07 NOTE — Progress Notes (Signed)
Family Medicine Teaching Service Daily Progress Note Intern Pager: [phone]  Patient name: Rachel Park Medical record number: [id number] Date of birth: [birth year] Age: 85 y.o. Gender: female  Primary Care Provider: Lajean Manes, MD Consultants: CCM (s/o), gen surg, cardiology, VVS Code Status: full  Pt Overview and Major Events to Date:  5/18 admitted to [facility] 5/19 ex lap, underwent colonic resection 5/21 underwent ileocolic anastomosis 1/75 extubated 5/24 out to floor, care resumed by FPTS 5/28 - worsening dehiscence of wound 5/29 - CT abd/CXR  Assessment and Plan: 85 year old female who presented with severe abdominal pain.  Found to have ischemic bowel on ex lap, bowel resected and underwent reanastomosis.  Has developed ileus postoperatively, extubated on 5/22 and transferred out of floor on 5/24.  Wound Dehiscence s/p ileocolic anastomosis  Patient's wound healing complicated by her poor nutritional status secondary to multiple surgeries and intubation and old age.  TPN discontinued, patient tolerating soft diet since 6/1.  Per surgery, wound is stable for discharge to CIR from their standpoint.  Zosyn DC'd on 6/2.  WBC 12.3, continuing to monitor. -Surgery following appreciate recs - electrolyte repletion per pharmacy -Abdominal binder when ambulating -3 times daily when dressing changes per surgery -Oxycodone 2.5-5mg  every 4 hours as needed -Tylenol 1 g every 6 hours -Robaxin 500 mg every 8 hours as needed per surgery -DC to CIR when bed available and accepted -PICC to be removed today  Possible Ileus: Resolving Patient with small bowel dilatation proximal to repair site on CT abdomen pelvis from 5/29 with possible ileus.  7 stools yesterday.  Was started on imodium on 6/2, with improvement from 7-9.   - ADAT: soft diet  Volume overload Volume overload likely secondary to a number of factors including poor blood protein and receiving a lot of fluid during the  admission.  Patient received IV Lasix 80 mg once yesterday and IV Lasix 40 mg.  Per cardiology, plan to continue with IV Lasix 40 mg twice daily. UOP on 6/2 2.6L. Cr mildly increased to 0.62 from 0.45 on 6/1.  On exam this AM, no BLE edema.   -IV Lasix 40mg  BID - likely can transition to PO today given improvement in edema, will await cards recs, would like to remove PICC -Cardiology following, appreciate recommendations - K-Dur 30 mEq BID per cardiology  Atrial fibrillation On apixaban 5 mg twice daily.  On diltiazem 24-hour capsule 180 mg, Lopressor 25 mg twice daily.  HR 93-115 overnight. -Continue Cardizem CD 100 mg daily, metoprolol 25 mg twice daily -Continue apixaban 5 mg twice daily -Daily BMP -Appreciate cardiology recommendations -Likely candidate for cardioversion after recovers from inpatient ailments  Right IJ and right brachial DVT Likely catheter associated as she has had a right IJ catheter with tip terminating right subclavian vein.  Vascular surgery was consulted for this, appreciate recommendations.  Recommendations are that given the placement of the DVT and that she is already on Eliquis 5 mg twice daily, no medication changes are necessary and the catheter can stay in.  Insomnia: Improved Patient has been able to sleep with current regimen. - Ramelteon qhs - encourage out of bed    Small left pleural effusion  Likely secondary to atelectasis.  No progression from last x-ray.  We will continue to monitor for fever or leukocytosis.   - incentive spirometry -Diuresis per above  Hypothyroidism -  Pt takes 61mcg synthroid at home daily.  - continue home med  Pseudo-hypocalcemia Calcium 7.5, albumin 1.6 on CMP  from 6/1.  Corrects to 9.4, within normal range - has been stable, monitor w/ BMP  Normocytic anemia Likely secondary to blood loss and poor nutrition.  Stable at 9.2. -cont to monitor CBC  FEN/GI:  Soft diet PPx: Pantoprazole 40 mg, Eliquis 5 mg twice  daily  Disposition: likely CIR  Subjective:  Patient states that she is feeling very well this morning.  States that she now understands that she needs to drink her Ensure and that that will not increase the swelling in her legs.  States that her pain is well controlled on current regimen.  Was able to sleep overnight.  Objective: Temp:  [97.3 F (36.3 C)-98.3 F (36.8 C)] 97.3 F (36.3 C) (06/03 0355) Pulse Rate:  [65-115] 102 (06/02 2304) Resp:  [24] 24 (06/02 0830) BP: (111-139)/(74-91) 139/91 (06/02 2304) SpO2:  [96 %-98 %] 98 % (06/02 2304) Weight:  [69.2 kg] 69.2 kg (06/03 0500)  Physical Exam:  General: 85 y.o. female in NAD Cardio: RRR no m/r/g Lungs: CTAB, no wheezing, no rhonchi, no crackles, no IWOB on room air Abdomen: Midline incision, per surgery note Skin: warm and dry Extremities: No edema   Laboratory: Recent Labs  Lab 08/05/18 0346 08/06/18 1347 08/07/18 0406  WBC 9.9 12.0* 12.3*  HGB 8.6* 9.3* 9.2*  HCT 26.0* 28.0* 28.3*  PLT 473* 578* 618*   Recent Labs  Lab 08/01/18 0335  08/05/18 0346 08/06/18 1347 08/07/18 0406  NA 131*   < > 132* 132* 133*  K 4.4   < > 3.2* 3.8 4.1  CL 96*   < > 98 98 95*  CO2 25   < > 27 26 26   BUN 22   < > 19 18 18   CREATININE 0.42*   < > 0.45 0.57 0.62  CALCIUM 7.8*   < > 7.5* 8.0* 8.2*  PROT 4.6*  --  4.3*  --   --   BILITOT 0.5  --  0.4  --   --   ALKPHOS 72  --  64  --   --   ALT 23  --  17  --   --   AST 22  --  15  --   --   GLUCOSE 105*   < > 116* 124* 89   < > = values in this interval not displayed.   No results found.  Meccariello, Bernita Raisin, DO 08/07/2018, 7:22 AM PGY-1, [place] Intern pager: [phone], text pages welcome

## 2018-08-08 LAB — CBC
HCT: 29.2 % — ABNORMAL LOW (ref 36.0–46.0)
Hemoglobin: 9.6 g/dL — ABNORMAL LOW (ref 12.0–15.0)
MCH: 29.8 pg (ref 26.0–34.0)
MCHC: 32.9 g/dL (ref 30.0–36.0)
MCV: 90.7 fL (ref 80.0–100.0)
Platelets: 670 10*3/uL — ABNORMAL HIGH (ref 150–400)
RBC: 3.22 MIL/uL — ABNORMAL LOW (ref 3.87–5.11)
RDW: 15.2 % (ref 11.5–15.5)
WBC: 11.6 10*3/uL — ABNORMAL HIGH (ref 4.0–10.5)
nRBC: 0 % (ref 0.0–0.2)

## 2018-08-08 LAB — BASIC METABOLIC PANEL
Anion gap: 10 (ref 5–15)
BUN: 23 mg/dL (ref 8–23)
CO2: 29 mmol/L (ref 22–32)
Calcium: 8.5 mg/dL — ABNORMAL LOW (ref 8.9–10.3)
Chloride: 95 mmol/L — ABNORMAL LOW (ref 98–111)
Creatinine, Ser: 0.58 mg/dL (ref 0.44–1.00)
GFR calc Af Amer: >60 mL/min (ref >60)
GFR calc non Af Amer: >60 mL/min (ref >60)
Glucose, Bld: 95 mg/dL (ref 70–99)
Potassium: 3.8 mmol/L (ref 3.5–5.1)
Sodium: 134 mmol/L — ABNORMAL LOW (ref 135–145)

## 2018-08-08 LAB — GLUCOSE, CAPILLARY
Glucose-Capillary: 124 mg/dL — ABNORMAL HIGH (ref 70–99)
Glucose-Capillary: 77 mg/dL (ref 70–99)
Glucose-Capillary: 95 mg/dL (ref 70–99)
Glucose-Capillary: 89 mg/dL (ref 70–99)

## 2018-08-08 MED ORDER — TORSEMIDE 20 MG PO TABS
20.0000 mg | ORAL_TABLET | Freq: Every day | ORAL | Status: DC
  Filled 2018-08-08 (×2): qty 1

## 2018-08-08 MED ORDER — PRO-STAT SUGAR FREE PO LIQD
30.0000 mL | Freq: Two times a day (BID) | ORAL | Status: DC
  Administered 2018-08-08 – 2018-08-12 (×9): 30 mL via ORAL
  Filled 2018-08-08 (×9): qty 30

## 2018-08-08 MED ORDER — ADULT MULTIVITAMIN W/MINERALS CH
1.0000 | ORAL_TABLET | Freq: Every day | ORAL | Status: DC
  Administered 2018-08-08 – 2018-08-12 (×5): 1 via ORAL
  Filled 2018-08-08 (×5): qty 1

## 2018-08-08 MED ORDER — POTASSIUM CHLORIDE CRYS ER 20 MEQ PO TBCR
30.0000 meq | EXTENDED_RELEASE_TABLET | Freq: Every day | ORAL | Status: DC
  Administered 2018-08-09: 30 meq via ORAL
  Filled 2018-08-08: qty 1

## 2018-08-08 NOTE — Progress Notes (Addendum)
Family Medicine Teaching Service Daily Progress Note Intern Pager: [phone]  Patient name: Rachel Park Medical record number: [id number] Date of birth: [birth year] Age: 85 y.o. Gender: female  Primary Care Provider: Lajean Manes, MD Consultants: CCM (s/o), gen surg, cardiology, VVS Code Status: full  Pt Overview and Major Events to Date:  5/18 admitted to [facility] 5/19 ex lap, underwent colonic resection 5/21 underwent ileocolic anastomosis 9/62 extubated 5/24 out to floor, care resumed by FPTS 5/28 - worsening dehiscence of wound 5/29 - CT abd/CXR  Assessment and Plan: 85 year old female who presented with severe abdominal pain.  Found to have ischemic bowel on ex lap, bowel resected and underwent reanastomosis.  Has developed ileus postoperatively, extubated on 5/22 and transferred out of floor on 5/24.  Patiently is appropriate for transfer to CIR.  Wound Dehiscence s/p ileocolic anastomosis  Being followed by surgery, note she is stable for discharge to CIR from this standpoint.  S/p Zosyn 5/29 to 6/2.  WBCs this a.m. 11.6, down from 12.3.  Patient has remained afebrile with normal vitals.  Given the patient's Zosyn has been discontinued and she was no longer receiving IV medications, PICC line been in place for 14 days and was recommended to be removed yesterday.  Patient refused removal of PEG until she was transferred to CIR because she states "I am a difficult stick." -Surgery following appreciate recs - electrolyte repletion per pharmacy -Abdominal binder when ambulating -3 times daily when dressing changes per surgery -Oxycodone 2.5-5mg  every 4 hours as needed -Tylenol 1 g every 6 hours -Robaxin 500 mg every 8 hours as needed per surgery -DC to CIR when bed available and accepted  Possible Post-Op Ileus: Resolved Patient had been having loose bowel movements about 9 times a day, improved to 2 times yesterday. - ADAT: soft diet -Imodium daily  Volume overload Volume  overload likely secondary to a number of factors including poor blood protein and receiving a lot of fluid during the admission.  Patient given IV Lasix 40 mg x 1 yesterday and started on torsemide 20 mg twice daily.  UOP on 6/3 3.8 L.  On exam this a.m. no edema BLE. -Cardiology decreasing torsemide to 20 mg daily -Cardiology following, appreciate recommendations - K-Dur 30 mEq QD per cardiology  Atrial fibrillation On apixaban 5 mg twice daily.  On diltiazem 24-hour capsule 180 mg, Lopressor 25 mg twice daily.  HR overnight 93-1 06.  Did have heart rate of 48 recorded, but believed to be outlier as was recorded pulse the same time as ECG heart rate of 93. -Continue Cardizem CD 100 mg daily, metoprolol 25 mg twice daily -Continue apixaban 5 mg twice daily -Daily BMP -Appreciate cardiology recommendations -Likely candidate for cardioversion after recovers from inpatient ailments  Right IJ and right brachial DVT Likely catheter associated as she has had a right IJ catheter with tip terminating right subclavian vein.  Vascular surgery was consulted for this, appreciate recommendations.  Recommendations are that given the placement of the DVT and that she is already on Eliquis 5 mg twice daily, no medication changes are necessary and the catheter can stay in.  Insomnia: Improved Patient has been able to sleep with current regimen. - Ramelteon qhs - encourage out of bed    Small left pleural effusion  Likely secondary to atelectasis.  No progression from last x-ray.  We will continue to monitor for fever or leukocytosis.   - incentive spirometry -Diuresis per above  Hypothyroidism -  Pt takes 75mcg  synthroid at home daily.  - continue home med  Pseudo-hypocalcemia Calcium 7.5, albumin 1.6 on CMP from 6/1.  Corrects to 9.4, within normal range - has been stable, monitor w/ BMP  Normocytic anemia Likely secondary to blood loss and poor nutrition.  Stable at 9.6. -cont to monitor  CBC  FEN/GI:  Soft diet PPx: Pantoprazole 40 mg, Eliquis 5 mg twice daily  Disposition: CIR when bed available  Subjective:  Patient states that she is feeling well this morning.  States that her leg swelling is much improved.  She is looking forward to going to CIR.  Objective: Temp:  [97.6 F (36.4 C)-98.1 F (36.7 C)] 97.6 F (36.4 C) (06/03 2327) Pulse Rate:  [48-133] 48 (06/04 0327) Resp:  [16-20] 20 (06/04 0327) BP: (116-137)/(73-82) 122/73 (06/04 0327) SpO2:  [95 %-96 %] 96 % (06/04 0327) Weight:  [65.2 kg] 65.2 kg (06/04 0659)  Physical Exam:  General: 85 y.o. female in NAD Cardio: RRR no m/r/g Lungs: CTAB, no wheezing, no rhonchi, no crackles, no IWOB on RA Abdomen: Midline abdominal wound per surgery Skin: warm and dry Extremities: No edema   Laboratory: Recent Labs  Lab 08/06/18 1347 08/07/18 0406 08/08/18 0436  WBC 12.0* 12.3* 11.6*  HGB 9.3* 9.2* 9.6*  HCT 28.0* 28.3* 29.2*  PLT 578* 618* 670*   Recent Labs  Lab 08/05/18 0346 08/06/18 1347 08/07/18 0406 08/08/18 0436  NA 132* 132* 133* 134*  K 3.2* 3.8 4.1 3.8  CL 98 98 95* 95*  CO2 27 26 26 29   BUN 19 18 18 23   CREATININE 0.45 0.57 0.62 0.58  CALCIUM 7.5* 8.0* 8.2* 8.5*  PROT 4.3*  --   --   --   BILITOT 0.4  --   --   --   ALKPHOS 64  --   --   --   ALT 17  --   --   --   AST 15  --   --   --   GLUCOSE 116* 124* 89 95   No results found.  Hanford Lust, Bernita Raisin, DO 08/08/2018, 8:14 AM PGY-1, [place] Intern pager: [phone], text pages welcome

## 2018-08-08 NOTE — Progress Notes (Signed)
Physical Therapy Treatment Patient Details Name: Rachel Park MRN: [id number] DOB: May 11, 1931 Today's Date: 08/08/2018    History of Present Illness 49 yof with SBO, failed medical management, taken to OR with findings of necrotic bowel s/p resection.  Abd left open with wound vac and returns to ICU on mechanical ventilation. Extubated 07/25/18    PT Comments    Patient progressing well wit therapy today, increasing distance and now supervision level with RW. Patient does not use AD at baseline, and lives alone with some concerns of bathing if she were to graduate to [place]. Will discuss with OT and continue to assess next PT session, updating recs formally to [place] if appropriate.     Follow Up Recommendations  CIR;Other (comment)(possible progression to HHPT- will re-assess next visit. )     Equipment Recommendations  Rolling walker with 5" wheels    Recommendations for Other Services       Precautions / Restrictions Precautions Precautions: Fall Precaution Comments: abdominal incision. heart pillow in room to splint incision Restrictions Weight Bearing Restrictions: No    Mobility  Bed Mobility Overal bed mobility: Needs Assistance Bed Mobility: Supine to Sit;Sit to Sidelying;Rolling              Transfers Overall transfer level: Needs assistance Equipment used: Rolling walker (2 wheeled) Transfers: Sit to/from Stand Sit to Stand: Min guard         General transfer comment: minguard for stability;vc for safe hand placement  Ambulation/Gait Ambulation/Gait assistance: Supervision Gait Distance (Feet): 200 Feet Assistive device: Rolling walker (2 wheeled) Gait Pattern/deviations: Step-through pattern;Decreased stride length;Trunk flexed;Shuffle Gait velocity: decreased   General Gait Details: supervision level today 200', RW required at this time due to weakness.   Stairs             Wheelchair Mobility    Modified Rankin (Stroke Patients Only)        Balance Overall balance assessment: Needs assistance Sitting-balance support: No upper extremity supported;Feet supported Sitting balance-Leahy Scale: Fair     Standing balance support: Single extremity supported;During functional activity Standing balance-Leahy Scale: Poor Standing balance comment: pt demonstrated preference for single UE support during ADL task while standing at the sink;pt able to static stand without UE bur requires UE support for dynamic tasks                            Cognition Arousal/Alertness: Awake/alert Behavior During Therapy: WFL for tasks assessed/performed Overall Cognitive Status: Within Functional Limits for tasks assessed Area of Impairment: Memory                     Memory: Decreased short-term memory         General Comments: pt with increased processing time      Exercises      General Comments        Pertinent Vitals/Pain Pain Assessment: No/denies pain    Home Living                      Prior Function            PT Goals (current goals can now be found in the care plan section) Acute Rehab PT Goals Patient Stated Goal: to be independent again PT Goal Formulation: With patient Time For Goal Achievement: 08/08/18 Potential to Achieve Goals: Good Progress towards PT goals: Progressing toward goals    Frequency  Min 3X/week      PT Plan Current plan remains appropriate    Co-evaluation              AM-PAC PT "6 Clicks" Mobility   Outcome Measure  Help needed turning from your back to your side while in a flat bed without using bedrails?: A Little Help needed moving from lying on your back to sitting on the side of a flat bed without using bedrails?: A Little Help needed moving to and from a bed to a chair (including a wheelchair)?: A Lot Help needed standing up from a chair using your arms (e.g., wheelchair or bedside chair)?: A Little Help needed to walk in hospital  room?: A Little Help needed climbing 3-5 steps with a railing? : A Lot 6 Click Score: 16    End of Session   Activity Tolerance: Patient tolerated treatment well Patient left: with call bell/phone within reach;in chair;Other (comment) Nurse Communication: Mobility status PT Visit Diagnosis: Muscle weakness (generalized) (M62.81)     Time: 1342-1400 PT Time Calculation (min) (ACUTE ONLY): 18 min  Charges:  $Gait Training: 8-22 mins                     Reinaldo Berber, PT, DPT Acute Rehabilitation Services Pager: [phone] Office: [phone]     Reinaldo Berber 08/08/2018, 4:09 PM

## 2018-08-08 NOTE — Care Management Important Message (Signed)
Important Message  Patient Details  Name: Rachel Park MRN: [id number] Date of Birth: August 09, 1931   Medicare Important Message Given:  Yes    Orbie Pyo 08/08/2018, 2:29 PM

## 2018-08-08 NOTE — Progress Notes (Addendum)
Inpatient Rehabilitation-Admissions Coordinator   [facility] has just received denial from pt's insurance company. Pt made aware and per her request [facility] will call her son to notify him of the denial.   Pt open to other rehab settings at this time. Will communicate with CM/SW regarding need for new dispo plans.   AC will sign off.   Please call if questions.    Jhonnie Garner, OTR/L  Rehab Admissions Coordinator  [phone] 08/08/2018 4:54 PM   Addendum: 5:03PM. Spoke with pt's son who is insisting we complete appeal. AC will speak with admitting PM&R MD tomorrow morning to see if he feels peer to peer is warranted. AC will follow up tomorrow.   Please call if questions.   Jhonnie Garner, OTR/L  Rehab Admissions Coordinator  [phone] 08/08/2018 5:06 PM

## 2018-08-08 NOTE — Progress Notes (Signed)
Progress Note  Patient Name: Rachel Park Date of Encounter: 08/08/2018  Primary Cardiologist: Mertie Moores, MD   Subjective   Overall feels much better. Asymptomatic w/ Afib. Rates currently ~107. Reports good UOP and states that her LEE has improved a great deal. No resting dyspnea or CP.  Good diuresis  Has diuresed 4.7 liters so far this admission Last BP is low   She would like to have some green vegetables if possible.    Inpatient Medications    Scheduled Meds: . acetaminophen  1,000 mg Oral Q6H  . apixaban  5 mg Oral BID  . chlorhexidine  15 mL Mouth Rinse BID  . Chlorhexidine Gluconate Cloth  6 each Topical Daily  . diltiazem  180 mg Oral Daily  . feeding supplement (PRO-STAT SUGAR FREE 64)  30 mL Oral BID  . levothyroxine  75 mcg Oral QAC breakfast  . lip balm  1 application Topical BID  . loperamide  2 mg Oral Daily  . mouth rinse  15 mL Mouth Rinse q12n4p  . metoprolol tartrate  25 mg Oral BID  . multivitamin with minerals  1 tablet Oral Daily  . pantoprazole  40 mg Oral Daily  . potassium chloride  30 mEq Oral BID  . psyllium  1 packet Oral BID  . ramelteon  8 mg Oral QHS  . sodium chloride flush  10-40 mL Intracatheter Q12H  . sodium chloride flush  3 mL Intravenous Q12H  . torsemide  20 mg Oral BID   Continuous Infusions: . sodium chloride     PRN Meds: sodium chloride, alum & mag hydroxide-simeth, bismuth subsalicylate, guaiFENesin-dextromethorphan, hydrocortisone, hydrocortisone cream, menthol-cetylpyridinium, methocarbamol, ondansetron **OR** ondansetron (ZOFRAN) IV, oxyCODONE, phenol, sodium chloride flush, sodium chloride flush   Vital Signs    Vitals:   08/08/18 0659 08/08/18 0834 08/08/18 0856 08/08/18 0900  BP:   (!) 84/53 (!) 79/53  Pulse:   94 95  Resp:    (!) 23  Temp:  (!) 97.3 F (36.3 C)    TempSrc:  Oral    SpO2:    97%  Weight: 65.2 kg     Height:        Intake/Output Summary (Last 24 hours) at 08/08/2018 1005 Last data  filed at 08/08/2018 0954 Gross per 24 hour  Intake 600 ml  Output 3900 ml  Net -3300 ml   Last 3 Weights 08/08/2018 08/07/2018 08/06/2018  Weight (lbs) 143 lb 11.8 oz 152 lb 8.9 oz 160 lb 15 oz  Weight (kg) 65.2 kg 69.2 kg 73 kg      Telemetry    Atrial fibrillation 99 - 105  bpm  - Personally Reviewed  ECG    Not performed today - Personally Reviewed  Physical Exam   Physical Exam: Blood pressure (!) 79/53, pulse 95, temperature (!) 97.3 F (36.3 C), temperature source Oral, resp. rate (!) 23, height 5' 2.5" (1.588 m), weight 65.2 kg, SpO2 97 %.  GEN:  Elderly female,   HEENT: Normal NECK: No JVD; No carotid bruits LYMPHATICS: No lymphadenopathy CARDIAC: Irreg. Irreg.  RESPIRATORY:  Clear to auscultation without rales, wheezing or rhonchi  ABDOMEN: Soft, non-tender, non-distended MUSCULOSKELETAL:  No edema; No deformity ,  Edema is gone ,  Decreased skin turgur.  SKIN: Warm and dry NEUROLOGIC:  Alert and oriented x 3  Labs    Chemistry Recent Labs  Lab 08/05/18 0346 08/06/18 1347 08/07/18 0406 08/08/18 0436  NA 132* 132* 133* 134*  K  3.2* 3.8 4.1 3.8  CL 98 98 95* 95*  CO2 27 26 26 29   GLUCOSE 116* 124* 89 95  BUN 19 18 18 23   CREATININE 0.45 0.57 0.62 0.58  CALCIUM 7.5* 8.0* 8.2* 8.5*  PROT 4.3*  --   --   --   ALBUMIN 1.6*  --   --   --   AST 15  --   --   --   ALT 17  --   --   --   ALKPHOS 64  --   --   --   BILITOT 0.4  --   --   --   GFRNONAA >60 >60 >60 >60  GFRAA >60 >60 >60 >60  ANIONGAP 7 8 12 10      Hematology Recent Labs  Lab 08/06/18 1347 08/07/18 0406 08/08/18 0436  WBC 12.0* 12.3* 11.6*  RBC 3.08* 3.10* 3.22*  HGB 9.3* 9.2* 9.6*  HCT 28.0* 28.3* 29.2*  MCV 90.9 91.3 90.7  MCH 30.2 29.7 29.8  MCHC 33.2 32.5 32.9  RDW 15.7* 15.7* 15.2  PLT 578* 618* 670*    Cardiac EnzymesNo results for input(s): TROPONINI in the last 168 hours. No results for input(s): TROPIPOC in the last 168 hours.   BNPNo results for input(s): BNP, PROBNP in  the last 168 hours.   DDimer No results for input(s): DDIMER in the last 168 hours.   Radiology    No results found.  Cardiac Studies   Echo 04/21/2018 IMPRESSIONS   1. The left ventricle has normal systolic function, with an ejection fraction of 55-60%. The cavity size was normal. Mild basal septal hypertrophy. Left ventricular diastology could not be evaluated secondary to atrial fibrillation. 2. The right ventricle has normal systolic function. The cavity was normal. There is no increase in right ventricular wall thickness. 3. Left atrial size was moderately dilated. 4. The mitral valve is normal in structure. 5. The tricuspid valve is normal in structure. Tricuspid valve regurgitation is mild-moderate. 6. The aortic valve is tricuspid Mild sclerosis of the aortic valve. 7. The pulmonic valve was normal in structure. Pulmonic valve regurgitation is mild by color flow Doppler. 8. Right atrial pressure is estimated at 3 mmHg.   RUE doppler 08/02/2018 Summary:  Right: Findings consistent with acute deep vein thrombosis involving the right internal jugular veins and right brachial veins. Findings consistent with acute superficial vein thrombosis involving the right basilic vein. PICC line is visualized and free of thrombus.  Left: No evidence of thrombosis in the subclavian.  Patient Profile        85 y.o. female with PMH of atrial fibrillation, renal infarct (04/2018) when off anticoagulation and CAD (stent to LAD 2016) presented on 5/18 with severe abdominal pain and found to have ischemic bowel, underwent exp lap and small bowel resection on 5/19. Ileocecectomy with ileocolonic anastomosis with abdominal wall closure on 5/21. Cardiology initially consulted for management of atrial fibrillation. Patient later developed volume overload. US revealed RUE DVT which was felt to be related to catheter.   Assessment & Plan    1. Acute on chronic diastolic CHF:  Has  diuresed.    Is now a bit   volume depleted .  Will reduce the Torsemide and Kdur to daily ( instead of BID)  She did not require diuretics prior to this admission.   She may not need them daily once she is over this surgery  2. Persistent atrial fibrillation:  continue eliquis   3.  Ischemic bowel: s/p small bowel resection and ileocecectomy. Management per general surgery.   4. Possible liver lesion:  Further work up per Int. Medicine team and surgical team .   5. H/O Renal infarct: occured in Feb 2020 when off anticoagulation. Now on Eliquis for secondary prevention as well as for management of acute DVT.   6. RUE DVT: noted on [place] 5/29. On anticoagulation. Felt to be related to catheter placement  7. Hypokalemia: resolved. K has been WNL the last 2 days. Continue to monitor while diuresing.   8. Anemia: Hb is stable       Mertie Moores, MD  08/08/2018 10:17 AM    [address] [place],  [place],   [id number] Pager [phone] Phone: [phone]; Fax: [phone]

## 2018-08-08 NOTE — Progress Notes (Signed)
Patient ID: Rachel Park, female   DOB: 1931/04/24, 85 y.o.   MRN: [id number]   Acute Care Surgery Service Progress Note:    Chief Complaint/Subjective: Has had some low BP this am. Reports she was a little dizzy earlier but better now. BP up to 90sys when I was in the room Wants some raw vegetables  Objective: Vital signs in last 24 hours: Temp:  [97.3 F (36.3 C)-98.1 F (36.7 C)] 97.3 F (36.3 C) (06/04 0834) Pulse Rate:  [48-133] 95 (06/04 0900) Resp:  [16-23] 23 (06/04 0900) BP: (79-122)/(53-77) 79/53 (06/04 0900) SpO2:  [95 %-97 %] 97 % (06/04 0900) Weight:  [65.2 kg] 65.2 kg (06/04 0659) Last BM Date: 08/07/18  Intake/Output from previous day: 06/03 0701 - 06/04 0700 In: 840 [P.O.:840] Out: 4200 [Urine:3800; Stool:400] Intake/Output this shift: No intake/output data recorded.  Lungs:  nonlabored  Cardiovascular: irreg  Abd: soft, min TTP, open midline wound/dehis, some granulation tissue, less fibrinous exudate today. No sign of fistula  Extremities: no edema, +SCDs  Neuro: alert, nonfocal  Lab Results: CBC  Recent Labs    08/07/18 0406 08/08/18 0436  WBC 12.3* 11.6*  HGB 9.2* 9.6*  HCT 28.3* 29.2*  PLT 618* 670*   BMET Recent Labs    08/07/18 0406 08/08/18 0436  NA 133* 134*  K 4.1 3.8  CL 95* 95*  CO2 26 29  GLUCOSE 89 95  BUN 18 23  CREATININE 0.62 0.58  CALCIUM 8.2* 8.5*   LFT Hepatic Function Latest Ref Rng & Units 08/05/2018 08/01/2018 07/29/2018  Total Protein 6.5 - 8.1 g/dL 4.3(L) 4.6(L) 4.5(L)  Albumin 3.5 - 5.0 g/dL 1.6(L) 1.8(L) 1.9(L)  AST 15 - 41 U/L 15 22 13(L)  ALT 0 - 44 U/L _0 Alk Phosphatase 38 - 126 U/L 64 72 50  Total Bilirubin 0.3 - 1.2 mg/dL 0.4 0.5 0.8  Bilirubin, Direct 0.00 - 0.40 mg/dL - - -   PT/INR No results for input(s): LABPROT, INR in the last 72 hours. ABG No results for input(s): PHART, HCO3 in the last 72 hours.  Invalid input(s): PCO2, PO2  Studies/Results:  Anti-infectives:  Anti-infectives (From admission, onward)   Start     Dose/Rate Route Frequency Ordered Stop   08/02/18 1600  piperacillin-tazobactam (ZOSYN) IVPB 3.375 g     3.375 g 12.5 mL/hr over 240 Minutes Intravenous Every 8 hours 08/02/18 1554 08/07/18 0306   07/25/18 1000  ceFAZolin (ANCEF) IVPB 2g/100 mL premix     2 g 200 mL/hr over 30 Minutes Intravenous To Short Stay 07/25/18 0749 07/25/18 1200   07/25/18 0745  ceFAZolin (ANCEF) IVPB 2g/100 mL premix  Status:  Discontinued     2 g 200 mL/hr over 30 Minutes Intravenous  Once 07/25/18 0735 07/25/18 0749   07/23/18 1000  cefoTEtan (CEFOTAN) 1 g in sodium chloride 0.9 % 100 mL IVPB     1 g 200 mL/hr over 30 Minutes Intravenous  Once 07/23/18 0825 07/23/18 1117      Medications: Scheduled Meds: . acetaminophen  1,000 mg Oral Q6H  . apixaban  5 mg Oral BID  . chlorhexidine  15 mL Mouth Rinse BID  . Chlorhexidine Gluconate Cloth  6 each Topical Daily  . diltiazem  180 mg Oral Daily  . feeding supplement (PRO-STAT SUGAR FREE 64)  30 mL Oral BID  . levothyroxine  75 mcg Oral QAC breakfast  . lip balm  1 application Topical BID  . loperamide  2  mg Oral Daily  . mouth rinse  15 mL Mouth Rinse q12n4p  . metoprolol tartrate  25 mg Oral BID  . multivitamin with minerals  1 tablet Oral Daily  . pantoprazole  40 mg Oral Daily  . [START ON 08/09/2018] potassium chloride  30 mEq Oral Daily  . psyllium  1 packet Oral BID  . ramelteon  8 mg Oral QHS  . sodium chloride flush  10-40 mL Intracatheter Q12H  . sodium chloride flush  3 mL Intravenous Q12H  . [START ON 08/09/2018] torsemide  20 mg Oral Daily   Continuous Infusions: . sodium chloride     PRN Meds:.sodium chloride, alum & mag hydroxide-simeth, bismuth subsalicylate, guaiFENesin-dextromethorphan, hydrocortisone, hydrocortisone cream, menthol-cetylpyridinium, methocarbamol, ondansetron **OR** ondansetron (ZOFRAN) IV, oxyCODONE, phenol, sodium chloride flush, sodium chloride flush   Assessment/Plan: Patient Active Problem List   Diagnosis Date Noted  . Acute deep vein thrombosis (DVT) of upper extremity ([place])   . Ileus ([place])   . SBO (small bowel obstruction) ([place]) 07/23/2018  . Small bowel obstruction ([place]) 07/22/2018  . Atrial fibrillation ([place]) 05/20/2018  . Renal infarct ([place])   . Hyperlipidemia 11/27/2016  . Bilateral inguinal hernia without obstruction or gangrene 03/07/2016  . Incisional hernia, without obstruction or gangrene 03/07/2016  . Persistent atrial fibrillation 06/21/2015  . TIA (transient ischemic attack) 03/24/2015  . CAD (coronary artery disease) 12/26/2014  . COPD (chronic obstructive pulmonary disease) ([place]) 12/26/2014  . Hypothyroidism 12/26/2014  . Cerebral embolism with cerebral infarction 12/25/2014   Atrial fibrillation benign lesion-currently on Eliquis Hx CAD/acute diastolic CHF Right IJ for acute DVT- Xarelto per Dr. Monica Martinez Hypothyroid - on Synthroid Left pleural effusion Hx of hypercholesterolemia Protein calorie malnutrition-prealbumin 6.2>>6.8 (5/24)>>12.4 (6/1)  Small bowel obstruction with ischemic bowel . Exploratory laparotomy, small bowel resection,511/19/2020, Dr. Rolm Bookbinder  Exploratory laparotomy, ileocecectomy with ileocolonic anastomosis, abdominal closure5/21/2020,Dr. Rolm Bookbinder   FEN: will change to cardiac diet  ID: Zosyn 5/29 >>day5 (d/c 6/2) ERD:[id number] Follow up: Dr. Donne Hazel  Disposition:abd is stable. Can have heart healthy diet/raw veggies; believe her BP is a little low from being diuresed. No signs of bleeding. Pt is eating/drinking well.   Still stable for CIR from our standpoint   LOS: 17 days    Leighton Ruff. Redmond Pulling, MD, FACS General, Bariatric, & Minimally Invasive Surgery [phone] [facility]

## 2018-08-08 NOTE — Progress Notes (Signed)
Nutrition Follow-up  RD working remotely.  DOCUMENTATION CODES:   Not applicable  INTERVENTION:   -D/c Ensure Enlive po TID, each supplement provides 350 kcal and 20 grams of protein -D/c Juven -30 ml Prostat BID, each supplement provides 100 kcals and 15 grams protein -MVI with minerals daily  NUTRITION DIAGNOSIS:   Increased nutrient needs related to post-op healing as evidenced by estimated needs.  Ongoing  GOAL:   Patient will meet greater than or equal to 90% of their needs  Progressing  MONITOR:   PO intake, Supplement acceptance, Weight trends, Labs, Skin, I & O's  REASON FOR ASSESSMENT:   Consult New TPN/TNA  ASSESSMENT:   Patient with PMH significant for CAD s/p cath, HTN, stroke, TIA, and SBO s/p hernia repair in 2018. Presents this admission small bowel obstruction with ischemic bowel.   5/19 - ex-lap resection,LOA, VAC placement for open abdomen 5/21 - s/p ileocecectomy with ileocolonic anastomosis, abdominal closure 5/22 - extubated 5/23- NGT d/c, transferred from ICU to PCU 5/26- advanced to full liquids, Ensure added BID by general surgery 5/27- advanced to soft diet  6/1- TPN d/c  Reviewed I/O's: -3.4 L x 24 hours and -1.2 L since 07/25/18  UOP: 3.8 L x 24 hours  Pt's appetite has improved; noted meal completion 50-100%. Pt intermittently accepts Ensure and Juven supplements; expressed frustration regarding multiple supplements and having to take lasix.   Plan to d/c to CIR once insurance authorization has been obtained.   Labs reviewed: Na: 134, CBGS: 106-128.   Diet Order:   Diet Order            DIET SOFT Room service appropriate? Yes; Fluid consistency: Thin  Diet effective now              EDUCATION NEEDS:   Not appropriate for education at this time  Skin:  Skin Assessment: Skin Integrity Issues: Skin Integrity Issues:: Incisions Wound Vac: d/c on 07/25/18 Incisions: abdomen dehisced  Last BM:  08/07/18  Height:   Ht  Readings from Last 1 Encounters:  07/23/18 5' 2.5" (1.588 m)    Weight:   Wt Readings from Last 1 Encounters:  08/08/18 65.2 kg    Ideal Body Weight:  52.3 kg  BMI:  Body mass index is 25.87 kg/m.  Estimated Nutritional Needs:   Kcal:  1650-1850  Protein:  85-105 grams  Fluid:  >/= 1.5 L    Zaine Elsass A. Jimmye Norman, RD, LDN, [place] Registered Dietitian II Certified Diabetes Care and Education Specialist Pager: [phone] After hours Pager: [phone]

## 2018-08-08 NOTE — Plan of Care (Signed)
  Problem: Education: Goal: Knowledge of General Education information will improve Description Including pain rating scale, medication(s)/side effects and non-pharmacologic comfort measures Outcome: Progressing   Problem: Pain Managment: Goal: General experience of comfort will improve Outcome: Progressing   Problem: Safety: Goal: Ability to remain free from injury will improve Outcome: Progressing   Problem: Education: Goal: Knowledge of General Education information will improve Description Including pain rating scale, medication(s)/side effects and non-pharmacologic comfort measures Outcome: Progressing   Problem: Health Behavior/Discharge Planning: Goal: Ability to manage health-related needs will improve Outcome: Progressing   Problem: Clinical Measurements: Goal: Ability to maintain clinical measurements within normal limits will improve Outcome: Progressing Goal: Will remain free from infection Outcome: Progressing Goal: Diagnostic test results will improve Outcome: Progressing Goal: Respiratory complications will improve Outcome: Progressing   Problem: Activity: Goal: Risk for activity intolerance will decrease Outcome: Progressing   Problem: Nutrition: Goal: Adequate nutrition will be maintained Outcome: Progressing   Problem: Coping: Goal: Level of anxiety will decrease Outcome: Progressing   Problem: Elimination: Goal: Will not experience complications related to bowel motility Outcome: Progressing Goal: Will not experience complications related to urinary retention Outcome: Progressing   Problem: Pain Managment: Goal: General experience of comfort will improve Outcome: Progressing   Problem: Safety: Goal: Ability to remain free from injury will improve Outcome: Progressing   Problem: Skin Integrity: Goal: Risk for impaired skin integrity will decrease Outcome: Not Met (add Reason)   Problem: Clinical Measurements: Goal: Cardiovascular  complication will be avoided Outcome: Not Applicable

## 2018-08-09 LAB — BASIC METABOLIC PANEL
Anion gap: 8 (ref 5–15)
BUN: 26 mg/dL — ABNORMAL HIGH (ref 8–23)
CO2: 28 mmol/L (ref 22–32)
Calcium: 8.3 mg/dL — ABNORMAL LOW (ref 8.9–10.3)
Chloride: 98 mmol/L (ref 98–111)
Creatinine, Ser: 0.67 mg/dL (ref 0.44–1.00)
GFR calc Af Amer: >60 mL/min (ref >60)
GFR calc non Af Amer: >60 mL/min (ref >60)
Glucose, Bld: 94 mg/dL (ref 70–99)
Potassium: 4.2 mmol/L (ref 3.5–5.1)
Sodium: 134 mmol/L — ABNORMAL LOW (ref 135–145)

## 2018-08-09 LAB — CBC
HCT: 27.6 % — ABNORMAL LOW (ref 36.0–46.0)
Hemoglobin: 9 g/dL — ABNORMAL LOW (ref 12.0–15.0)
MCH: 29.9 pg (ref 26.0–34.0)
MCHC: 32.6 g/dL (ref 30.0–36.0)
MCV: 91.7 fL (ref 80.0–100.0)
Platelets: 639 10*3/uL — ABNORMAL HIGH (ref 150–400)
RBC: 3.01 MIL/uL — ABNORMAL LOW (ref 3.87–5.11)
RDW: 15.5 % (ref 11.5–15.5)
WBC: 11.3 10*3/uL — ABNORMAL HIGH (ref 4.0–10.5)
nRBC: 0 % (ref 0.0–0.2)

## 2018-08-09 LAB — GLUCOSE, CAPILLARY
Glucose-Capillary: 139 mg/dL — ABNORMAL HIGH (ref 70–99)
Glucose-Capillary: 92 mg/dL (ref 70–99)
Glucose-Capillary: 90 mg/dL (ref 70–99)

## 2018-08-09 NOTE — Progress Notes (Signed)
Physical Therapy Treatment Patient Details Name: Rachel Park MRN: [id number] DOB: 08/19/1931 Today's Date: 08/09/2018    History of Present Illness 53 yof with SBO, failed medical management, taken to OR with findings of necrotic bowel s/p resection.  Abd left open with wound vac and returns to ICU on mechanical ventilation. Extubated 07/25/18    PT Comments    Pt assisted with log roll technique and ambulated in hallway using RW.  Possible d/c to CIR pending.  If not CIR, pt will need max HH services (such as PT, RN, aide) due to living alone.  Follow Up Recommendations  CIR(if not CIR, will need as much HH services as possible due to living alone)     Equipment Recommendations  Rolling walker with 5" wheels    Recommendations for Other Services       Precautions / Restrictions Precautions Precautions: Fall Precaution Comments: abdominal incision. heart pillow in room to splint incision    Mobility  Bed Mobility Overal bed mobility: Needs Assistance Bed Mobility: Rolling;Sidelying to Sit Rolling: Min guard Sidelying to sit: Min assist       General bed mobility comments: verbal cues for log roll technique with pillow hug, assisted with trunk to alleviate muscular/abdominal straining, pt reports she has not been wearing abdominal binder due to it not staying in place while she is mobile.  Transfers Overall transfer level: Needs assistance Equipment used: Rolling walker (2 wheeled) Transfers: Sit to/from Stand Sit to Stand: Min guard         General transfer comment: verbal cues for hand placement, min/guard for safety  Ambulation/Gait Ambulation/Gait assistance: Supervision;Min guard Gait Distance (Feet): 200 Feet Assistive device: Rolling walker (2 wheeled) Gait Pattern/deviations: Step-through pattern;Decreased stride length Gait velocity: decreased   General Gait Details: required UE support through RW, HR 110-135 bpm, Spo2 89-96% on room air   Stairs              Wheelchair Mobility    Modified Rankin (Stroke Patients Only)       Balance Overall balance assessment: Needs assistance           Standing balance-Leahy Scale: Fair Standing balance comment: static standing fair however pt requires UE support during dynamic tasks                            Cognition Arousal/Alertness: Awake/alert Behavior During Therapy: WFL for tasks assessed/performed Overall Cognitive Status: Within Functional Limits for tasks assessed                                        Exercises General Exercises - Lower Extremity Ankle Circles/Pumps: AROM;Both;10 reps;Seated Long Arc Quad: 10 reps;Both;Seated;AROM    General Comments        Pertinent Vitals/Pain Pain Assessment: 0-10 Pain Score: 3  Pain Location: abdomen Pain Descriptors / Indicators: Guarding;Discomfort Pain Intervention(s): Monitored during session;Repositioned    Home Living                      Prior Function            PT Goals (current goals can now be found in the care plan section) Acute Rehab PT Goals PT Goal Formulation: With patient Time For Goal Achievement: 08/16/18 Potential to Achieve Goals: Good Progress towards PT goals: Progressing toward goals  Frequency    Min 3X/week      PT Plan Current plan remains appropriate    Co-evaluation              AM-PAC PT "6 Clicks" Mobility   Outcome Measure  Help needed turning from your back to your side while in a flat bed without using bedrails?: A Little Help needed moving from lying on your back to sitting on the side of a flat bed without using bedrails?: A Little Help needed moving to and from a bed to a chair (including a wheelchair)?: A Little Help needed standing up from a chair using your arms (e.g., wheelchair or bedside chair)?: A Little Help needed to walk in hospital room?: A Little Help needed climbing 3-5 steps with a railing? : A  Little 6 Click Score: 18    End of Session   Activity Tolerance: Patient tolerated treatment well Patient left: with call bell/phone within reach;in chair;with chair alarm set   PT Visit Diagnosis: Muscle weakness (generalized) (M62.81)     Time: 4356-8616 PT Time Calculation (min) (ACUTE ONLY): 17 min  Charges:  $Gait Training: 8-22 mins                     Carmelia Bake, PT, DPT Acute Rehabilitation Services Office: [phone] Pager: [phone]  Rachel Park 08/09/2018, 1:05 PM

## 2018-08-09 NOTE — Progress Notes (Signed)
Progress Note  Patient Name: Rachel Park Date of Encounter: 08/09/2018  Primary Cardiologist: Mertie Moores, MD   Subjective   Overall feels much better. Asymptomatic w/ Afib. Rates currently ~107. Reports good UOP and states that her LEE has improved a great deal. No resting dyspnea or CP.   Has diuresed 6.2 liters so far this admission BP was low this am HR is elevate.  Will DC torsemide      Inpatient Medications    Scheduled Meds: . acetaminophen  1,000 mg Oral Q6H  . apixaban  5 mg Oral BID  . chlorhexidine  15 mL Mouth Rinse BID  . Chlorhexidine Gluconate Cloth  6 each Topical Daily  . diltiazem  180 mg Oral Daily  . feeding supplement (PRO-STAT SUGAR FREE 64)  30 mL Oral BID  . levothyroxine  75 mcg Oral QAC breakfast  . lip balm  1 application Topical BID  . mouth rinse  15 mL Mouth Rinse q12n4p  . metoprolol tartrate  25 mg Oral BID  . multivitamin with minerals  1 tablet Oral Daily  . pantoprazole  40 mg Oral Daily  . potassium chloride  30 mEq Oral Daily  . psyllium  1 packet Oral BID  . ramelteon  8 mg Oral QHS  . sodium chloride flush  10-40 mL Intracatheter Q12H  . sodium chloride flush  3 mL Intravenous Q12H  . torsemide  20 mg Oral Daily   Continuous Infusions: . sodium chloride     PRN Meds: sodium chloride, alum & mag hydroxide-simeth, bismuth subsalicylate, guaiFENesin-dextromethorphan, hydrocortisone, hydrocortisone cream, menthol-cetylpyridinium, methocarbamol, ondansetron **OR** ondansetron (ZOFRAN) IV, oxyCODONE, phenol, sodium chloride flush, sodium chloride flush   Vital Signs    Vitals:   08/08/18 2243 08/09/18 0055 08/09/18 0500 08/09/18 0730  BP:  121/84 126/87   Pulse: (!) 103 (!) 109 (!) 113   Resp: 15 16 (!) 23   Temp:  98.2 F (36.8 C) 97.6 F (36.4 C)   TempSrc:  Oral Oral   SpO2: 98% 95% 97%   Weight:    66.4 kg  Height:        Intake/Output Summary (Last 24 hours) at 08/09/2018 1020 Last data filed at 08/09/2018 0547  Gross per 24 hour  Intake 120 ml  Output 700 ml  Net -580 ml   Last 3 Weights 08/09/2018 08/08/2018 08/07/2018  Weight (lbs) 146 lb 6.2 oz 143 lb 11.8 oz 152 lb 8.9 oz  Weight (kg) 66.4 kg 65.2 kg 69.2 kg      Telemetry    Atrial fibrillation 99 - 105  bpm  - Personally Reviewed  ECG    Not performed today - Personally Reviewed  Physical Exam    Physical Exam: Blood pressure 126/87, pulse (!) 113, temperature 97.6 F (36.4 C), temperature source Oral, resp. rate (!) 23, height 5' 2.5" (1.588 m), weight 66.4 kg, SpO2 97 %.  GEN:   Elderly female,  NAD  HEENT: Normal NECK: No JVD; No carotid bruits LYMPHATICS: No lymphadenopathy CARDIAC:  Irreg. Irreg.  RESPIRATORY:  Clear to auscultation without rales, wheezing or rhonchi  ABDOMEN: Soft, non-tender, non-distended MUSCULOSKELETAL:  No edema; No deformity  SKIN: Warm and dry NEUROLOGIC:  Alert and oriented x 3   Labs    Chemistry Recent Labs  Lab 08/05/18 0346  08/07/18 0406 08/08/18 0436 08/09/18 0343  NA 132*   < > 133* 134* 134*  K 3.2*   < > 4.1 3.8 4.2  CL  98   < > 95* 95* 98  CO2 27   < > 26 29 28   GLUCOSE 116*   < > 89 95 94  BUN 19   < > 18 23 26*  CREATININE 0.45   < > 0.62 0.58 0.67  CALCIUM 7.5*   < > 8.2* 8.5* 8.3*  PROT 4.3*  --   --   --   --   ALBUMIN 1.6*  --   --   --   --   AST 15  --   --   --   --   ALT 17  --   --   --   --   ALKPHOS 64  --   --   --   --   BILITOT 0.4  --   --   --   --   GFRNONAA >60   < > >60 >60 >60  GFRAA >60   < > >60 >60 >60  ANIONGAP 7   < > 12 10 8    < > = values in this interval not displayed.     Hematology Recent Labs  Lab 08/07/18 0406 08/08/18 0436 08/09/18 0343  WBC 12.3* 11.6* 11.3*  RBC 3.10* 3.22* 3.01*  HGB 9.2* 9.6* 9.0*  HCT 28.3* 29.2* 27.6*  MCV 91.3 90.7 91.7  MCH 29.7 29.8 29.9  MCHC 32.5 32.9 32.6  RDW 15.7* 15.2 15.5  PLT 618* 670* 639*    Cardiac EnzymesNo results for input(s): TROPONINI in the last 168 hours. No results for  input(s): TROPIPOC in the last 168 hours.   BNPNo results for input(s): BNP, PROBNP in the last 168 hours.   DDimer No results for input(s): DDIMER in the last 168 hours.   Radiology    No results found.  Cardiac Studies   Echo 04/21/2018 IMPRESSIONS   1. The left ventricle has normal systolic function, with an ejection fraction of 55-60%. The cavity size was normal. Mild basal septal hypertrophy. Left ventricular diastology could not be evaluated secondary to atrial fibrillation. 2. The right ventricle has normal systolic function. The cavity was normal. There is no increase in right ventricular wall thickness. 3. Left atrial size was moderately dilated. 4. The mitral valve is normal in structure. 5. The tricuspid valve is normal in structure. Tricuspid valve regurgitation is mild-moderate. 6. The aortic valve is tricuspid Mild sclerosis of the aortic valve. 7. The pulmonic valve was normal in structure. Pulmonic valve regurgitation is mild by color flow Doppler. 8. Right atrial pressure is estimated at 3 mmHg.   RUE doppler 08/02/2018 Summary:  Right: Findings consistent with acute deep vein thrombosis involving the right internal jugular veins and right brachial veins. Findings consistent with acute superficial vein thrombosis involving the right basilic vein. PICC line is visualized and free of thrombus.  Left: No evidence of thrombosis in the subclavian.  Patient Profile        85 y.o. female with PMH of atrial fibrillation, renal infarct (04/2018) when off anticoagulation and CAD (stent to LAD 2016) presented on 5/18 with severe abdominal pain and found to have ischemic bowel, underwent exp lap and small bowel resection on 5/19. Ileocecectomy with ileocolonic anastomosis with abdominal wall closure on 5/21. Cardiology initially consulted for management of atrial fibrillation. Patient later developed volume overload. US revealed RUE DVT which was felt to be  related to catheter.   Assessment & Plan    1. Acute on chronic diastolic CHF:  Has diuresed.  Is now a bit   volume depleted .  Will DC torsemide  Will follow   2. Persistent atrial fibrillation:  continue eliquis , HR is a bit elevated today - possibly due to some volume depletion  Will see if it improves with holding the torsemide.  We can consider increasing her diltiazem ( as long as her HR is stable )   3. Ischemic bowel: s/p small bowel resection and ileocecectomy. Management per general surgery.   4. Possible liver lesion:  Further work up per Int. Medicine team and surgical team .   5. H/O Renal infarct: occured in Feb 2020 when off anticoagulation. Now on Eliquis for secondary prevention as well as for management of acute DVT.   6. RUE DVT: noted on [place] 5/29. On anticoagulation. Felt to be related to catheter placement  7. Hypokalemia: . Resolved   8. Anemia: Hb is stable       Mertie Moores, MD  08/09/2018 10:20 AM    [address],  [place], [place]  [address] Pager [phone] Phone: [phone]; Fax: [phone]

## 2018-08-09 NOTE — TOC Initial Note (Signed)
Transition of Care [facility]) - Initial/Assessment Note    Patient Details  Name: Rachel Park MRN: [id number] Date of Birth: February 02, 1932  [facility] CM/SW Contact:    Gelene Mink, [place] Phone Number: 08/09/2018, 2:35 PM  Clinical Narrative:             The patient has been denied CIR. The peer to peer was not approved. CSW called and spoke with the patient's son Dr. Sherren Mocha Early. He is a physician within [facility]. He stated that he would like his mother to come home with home health if she is not able to go to CIR. Kelly from CIR has already called and spoke with Dr. Donnetta Hutching. CSW is sending a copy of the [place] and Skilled nursing compare list.   He stated that he would be making the decision for which home health agency he would like to use.   Weekend CSW will follow up with Dr. Donnetta Hutching to see what home health agency he is wanting. The patient will need a new evaluation to determine what equipment will be needed.         Expected Discharge Plan: [place] Barriers to Discharge: Continued Medical Work up   Patient Goals and CMS Choice Patient states their goals for this hospitalization and ongoing recovery are:: Pt son would like his mother to come home with home health CMS Medicare.gov Compare Post Acute Care list provided to:: Patient Represenative (must comment) Choice offered to / list presented to : Adult Children  Expected Discharge Plan and Services Expected Discharge Plan: [place] In-house Referral: Clinical Social Work   Post Acute Care Choice: [place] arrangements for the past 2 months: Single Family Home                           HH Arranged: NA [place] Agency: (Awaiting a decision for home health agency, pt son is supposed to follow back up with [place] regarding the decision)        Prior Living Arrangements/Services Living arrangements for the past 2 months: Single Family Home Lives with::  Self Patient language and need for interpreter reviewed:: No Do you feel safe going back to the place where you live?: Yes      Need for Family Participation in Patient Care: Yes (Comment)     Criminal Activity/Legal Involvement Pertinent to Current Situation/Hospitalization: No - Comment as needed  Activities of Daily Living Home Assistive Devices/Equipment: Eyeglasses, Grab bars in shower ADL Screening (condition at time of admission) Patient's cognitive ability adequate to safely complete daily activities?: Yes Is the patient deaf or have difficulty hearing?: No Does the patient have difficulty seeing, even when wearing glasses/contacts?: Yes Does the patient have difficulty concentrating, remembering, or making decisions?: No Patient able to express need for assistance with ADLs?: Yes Does the patient have difficulty dressing or bathing?: No Independently performs ADLs?: Yes (appropriate for developmental age) Does the patient have difficulty walking or climbing stairs?: No Weakness of Legs: None Weakness of Arms/Hands: None  Permission Sought/Granted Permission sought to share information with : Case Manager Permission granted to share information with : Yes, Verbal Permission Granted  Share Information with NAME: Curt Jews  Permission granted to share info w AGENCY: [place] granted to share info w Relationship: Son     Emotional Assessment Appearance:: Appears stated age Attitude/Demeanor/Rapport: Unable to Assess  Affect (typically observed): Unable to Assess Orientation: : Oriented to Self, Oriented to  Time, Oriented to Place, Oriented to Situation Alcohol / Substance Use: Not Applicable Psych Involvement: No (comment)  Admission diagnosis:  Small bowel obstruction ([place]) [K56.609] Encounter for imaging study to confirm nasogastric (NG) tube placement [Z01.89] SBO (small bowel obstruction) ([place]) [K56.609] Patient Active Problem List   Diagnosis  Date Noted  . Acute deep vein thrombosis (DVT) of upper extremity ([place])   . Ileus ([place])   . SBO (small bowel obstruction) ([place]) 07/23/2018  . Small bowel obstruction ([place]) 07/22/2018  . Atrial fibrillation ([place]) 05/20/2018  . Renal infarct ([place])   . Hyperlipidemia 11/27/2016  . Bilateral inguinal hernia without obstruction or gangrene 03/07/2016  . Incisional hernia, without obstruction or gangrene 03/07/2016  . Persistent atrial fibrillation 06/21/2015  . TIA (transient ischemic attack) 03/24/2015  . CAD (coronary artery disease) 12/26/2014  . COPD (chronic obstructive pulmonary disease) ([place]) 12/26/2014  . Hypothyroidism 12/26/2014  . Cerebral embolism with cerebral infarction 12/25/2014   PCP:  Lajean Manes, MD Pharmacy:   [place], [place] - [address] [place] [place] [address] Phone: [phone] Fax: [phone]     Social Determinants of Health (SDOH) Interventions    Readmission Risk Interventions No flowsheet data found.

## 2018-08-09 NOTE — Progress Notes (Signed)
Inpatient Rehabilitation-Admissions Coordinator   Dr. Delice Lesch has agreed to do peer to peer conference to attempt to overturn insurance denial. Will update once a determination has been made.   Please call if questions.   Jhonnie Garner, OTR/L  Rehab Admissions Coordinator  [phone] 08/09/2018 8:22 AM

## 2018-08-09 NOTE — Progress Notes (Addendum)
Family Medicine Teaching Service Daily Progress Note Intern Pager: [phone]  Patient name: Rachel Park Medical record number: [id number] Date of birth: 1931-09-10 Age: 85 y.o. Gender: female  Primary Care Provider: Lajean Manes, MD Consultants: CCM (s/o), gen surg, cardiology, VVS Code Status: full  Pt Overview and Major Events to Date:  5/18 admitted to [facility] 5/19 ex lap, underwent colonic resection 5/21 underwent ileocolic anastomosis 2/42 extubated 5/24 out to floor, care resumed by FPTS 5/28 - worsening dehiscence of wound 5/29 - CT abd/CXR  Assessment and Plan: 85 year old female who presented with severe abdominal pain.  Found to have ischemic bowel on ex lap, bowel resected and underwent reanastomosis.  Has developed ileus postoperatively, extubated on 5/22 and transferred out of floor on 5/24.  Patiently is appropriate for transfer to CIR.  Wound Dehiscence s/p ileocolic anastomosis  Being followed by surgery, note she is stable for discharge to CIR from this standpoint.  S/p Zosyn 5/29 to 6/2.  Patient continues remain afebrile with normal vitals.  Was advanced to cardiac diet on 6/4.  WBC 11.3. -Surgery following appreciate recs - electrolyte repletion per pharmacy -Abdominal binder when ambulating -3 times daily when dressing changes per surgery -Oxycodone 2.5-5mg  every 4 hours as needed -Tylenol 1 g every 6 hours -Robaxin 500 mg every 8 hours as needed per surgery -DC to CIR when bed available and accepted, Dr. Delice Lesch attempting peer-to-peer  Possible Post-Op Ileus: Resolved 2 stools yesterday.  Advance to cardiac diet on 6/4. -React diet -d/c imodium per surgery - cont metamucil  Volume overload Volume overload likely secondary to a number of factors including poor blood protein and receiving a lot of fluid during the admission.  Patient now receiving torsemide 20 mg daily.  UOP on 6/4 with multiple unrecorded's, 700 mL charted.  On exam no BLE  edema. -Continue torsemide 20 mg daily -Cardiology following, appreciate recommendations - K-Dur 30 mEq QD per cardiology  Atrial fibrillation On apixaban 5 mg twice daily.  On diltiazem 24-hour capsule 180 mg, Lopressor 25 mg twice daily.  Heart rate control stable, 81-125 overnight. -Continue Cardizem CD 100 mg daily, metoprolol 25 mg twice daily -Continue apixaban 5 mg twice daily -Daily BMP -Appreciate cardiology recommendations -Likely candidate for cardioversion after recovers from inpatient ailments  Right IJ and right brachial DVT Likely catheter associated as she has had a right IJ catheter with tip terminating right subclavian vein.  Vascular surgery was consulted for this, appreciate recommendations.  Recommendations are that given the placement of the DVT and that she is already on Eliquis 5 mg twice daily, no medication changes are necessary and the catheter can stay in.  Insomnia: Improved Patient has been able to sleep with current regimen. - Ramelteon qhs - encourage out of bed    Small left pleural effusion  Likely secondary to atelectasis.  No progression from last x-ray.  We will continue to monitor for fever or leukocytosis.   - incentive spirometry -Diuresis per above  Hypothyroidism -  Pt takes 49mcg synthroid at home daily.  - continue home med  Pseudo-hypocalcemia Calcium 7.5, albumin 1.6 on CMP from 6/1.  Corrects to 9.4, within normal range - has been stable, monitor w/ BMP  Normocytic anemia Likely secondary to blood loss and poor nutrition.  Stable at 9.0. -cont to monitor CBC  FEN/GI:  Soft diet PPx: Pantoprazole 40 mg, Eliquis 5 mg twice daily  Disposition: To CIR when approved and bed available  Subjective:  Patient resting comfortably  this AM.  Insurance had denied CIR, but Peer-to-Peer is being attempted.  Objective: Temp:  [97.6 F (36.4 C)-98.2 F (36.8 C)] 97.6 F (36.4 C) (06/05 0500) Pulse Rate:  [89-137] 113 (06/05  0500) Resp:  [15-29] 23 (06/05 0500) BP: (79-129)/(53-89) 126/87 (06/05 0500) SpO2:  [93 %-98 %] 97 % (06/05 0500) Weight:  [66.4 kg] 66.4 kg (06/05 0730)  Physical Exam:  General: 85 y.o. female in NAD, resting comfortably Cardio: irregular Lungs: CTAB, no wheezing, no rhonchi, no crackles, no IWOB on RA Abdomen: midline abdominal incision per surgery Skin: warm and dry Extremities: No edema    Laboratory: Recent Labs  Lab 08/07/18 0406 08/08/18 0436 08/09/18 0343  WBC 12.3* 11.6* 11.3*  HGB 9.2* 9.6* 9.0*  HCT 28.3* 29.2* 27.6*  PLT 618* 670* 639*   Recent Labs  Lab 08/05/18 0346  08/07/18 0406 08/08/18 0436 08/09/18 0343  NA 132*   < > 133* 134* 134*  K 3.2*   < > 4.1 3.8 4.2  CL 98   < > 95* 95* 98  CO2 27   < > 26 29 28   BUN 19   < > 18 23 26*  CREATININE 0.45   < > 0.62 0.58 0.67  CALCIUM 7.5*   < > 8.2* 8.5* 8.3*  PROT 4.3*  --   --   --   --   BILITOT 0.4  --   --   --   --   ALKPHOS 64  --   --   --   --   ALT 17  --   --   --   --   AST 15  --   --   --   --   GLUCOSE 116*   < > 89 95 94   < > = values in this interval not displayed.   No results found.  Ikea Demicco, Bernita Raisin, DO 08/09/2018, 8:45 AM PGY-1, [place] Intern pager: [phone], text pages welcome

## 2018-08-09 NOTE — Progress Notes (Addendum)
Inpatient Rehabilitation-Admissions Coordinator   Per [employer] company, denial upheld after peer to peer conference. AC will update pt's son and communicate with CM/SW regarding need for new dispo plans.   AC to sign off.   Please call if questions.   Jhonnie Garner, OTR/L  Rehab Admissions Coordinator  [phone] 08/09/2018 1:55 PM

## 2018-08-09 NOTE — Progress Notes (Signed)
Patient ID: Rachel Park, female   DOB: 1931/04/23, 85 y.o.   MRN: [id number]   Acute Care Surgery Service Progress Note:    Chief Complaint/Subjective: BP much improved.reports stools are firmer. No n/v.  Eating well. Min discomfort with dressing changes.   Objective: Vital signs in last 24 hours: Temp:  [97.6 F (36.4 C)-98.2 F (36.8 C)] 97.6 F (36.4 C) (06/05 0500) Pulse Rate:  [89-137] 113 (06/05 0500) Resp:  [15-29] 23 (06/05 0500) BP: (79-129)/(53-89) 126/87 (06/05 0500) SpO2:  [93 %-98 %] 97 % (06/05 0500) Weight:  [66.4 kg] 66.4 kg (06/05 0730) Last BM Date: 08/08/18  Intake/Output from previous day: 06/04 0701 - 06/05 0700 In: 120 [P.O.:120] Out: 700 [Urine:700] Intake/Output this shift: No intake/output data recorded.  Lungs:  nonlabored  Cardiovascular: irreg  Abd: soft, min TTP, open midline wound/dehis, some granulation tissue, min fibrinous exudate today. No sign of fistula  Extremities: no edema, +SCDs  Neuro: alert, nonfocal  Lab Results: CBC  Recent Labs    08/08/18 0436 08/09/18 0343  WBC 11.6* 11.3*  HGB 9.6* 9.0*  HCT 29.2* 27.6*  PLT 670* 639*   BMET Recent Labs    08/08/18 0436 08/09/18 0343  NA 134* 134*  K 3.8 4.2  CL 95* 98  CO2 29 28  GLUCOSE 95 94  BUN 23 26*  CREATININE 0.58 0.67  CALCIUM 8.5* 8.3*   LFT Hepatic Function Latest Ref Rng & Units 08/05/2018 08/01/2018 07/29/2018  Total Protein 6.5 - 8.1 g/dL 4.3(L) 4.6(L) 4.5(L)  Albumin 3.5 - 5.0 g/dL 1.6(L) 1.8(L) 1.9(L)  AST 15 - 41 U/L 15 22 13(L)  ALT 0 - 44 U/L _0 Alk Phosphatase 38 - 126 U/L 64 72 50  Total Bilirubin 0.3 - 1.2 mg/dL 0.4 0.5 0.8  Bilirubin, Direct 0.00 - 0.40 mg/dL - - -   PT/INR No results for input(s): LABPROT, INR in the last 72 hours. ABG No results for input(s): PHART, HCO3 in the last 72 hours.  Invalid input(s): PCO2, PO2  Studies/Results:  Anti-infectives: Anti-infectives (From admission, onward)   Start     Dose/Rate Route  Frequency Ordered Stop   08/02/18 1600  piperacillin-tazobactam (ZOSYN) IVPB 3.375 g     3.375 g 12.5 mL/hr over 240 Minutes Intravenous Every 8 hours 08/02/18 1554 08/07/18 0306   07/25/18 1000  ceFAZolin (ANCEF) IVPB 2g/100 mL premix     2 g 200 mL/hr over 30 Minutes Intravenous To Short Stay 07/25/18 0749 07/25/18 1200   07/25/18 0745  ceFAZolin (ANCEF) IVPB 2g/100 mL premix  Status:  Discontinued     2 g 200 mL/hr over 30 Minutes Intravenous  Once 07/25/18 0735 07/25/18 0749   07/23/18 1000  cefoTEtan (CEFOTAN) 1 g in sodium chloride 0.9 % 100 mL IVPB     1 g 200 mL/hr over 30 Minutes Intravenous  Once 07/23/18 0825 07/23/18 1117      Medications: Scheduled Meds: . acetaminophen  1,000 mg Oral Q6H  . apixaban  5 mg Oral BID  . chlorhexidine  15 mL Mouth Rinse BID  . Chlorhexidine Gluconate Cloth  6 each Topical Daily  . diltiazem  180 mg Oral Daily  . feeding supplement (PRO-STAT SUGAR FREE 64)  30 mL Oral BID  . levothyroxine  75 mcg Oral QAC breakfast  . lip balm  1 application Topical BID  . loperamide  2 mg Oral Daily  . mouth rinse  15 mL Mouth Rinse q12n4p  .  metoprolol tartrate  25 mg Oral BID  . multivitamin with minerals  1 tablet Oral Daily  . pantoprazole  40 mg Oral Daily  . potassium chloride  30 mEq Oral Daily  . psyllium  1 packet Oral BID  . ramelteon  8 mg Oral QHS  . sodium chloride flush  10-40 mL Intracatheter Q12H  . sodium chloride flush  3 mL Intravenous Q12H  . torsemide  20 mg Oral Daily   Continuous Infusions: . sodium chloride     PRN Meds:.sodium chloride, alum & mag hydroxide-simeth, bismuth subsalicylate, guaiFENesin-dextromethorphan, hydrocortisone, hydrocortisone cream, menthol-cetylpyridinium, methocarbamol, ondansetron **OR** ondansetron (ZOFRAN) IV, oxyCODONE, phenol, sodium chloride flush, sodium chloride flush  Assessment/Plan: Patient Active Problem List   Diagnosis Date Noted  . Acute deep vein thrombosis (DVT) of upper  extremity ([place])   . Ileus ([place])   . SBO (small bowel obstruction) ([place]) 07/23/2018  . Small bowel obstruction ([place]) 07/22/2018  . Atrial fibrillation ([place]) 05/20/2018  . Renal infarct ([place])   . Hyperlipidemia 11/27/2016  . Bilateral inguinal hernia without obstruction or gangrene 03/07/2016  . Incisional hernia, without obstruction or gangrene 03/07/2016  . Persistent atrial fibrillation 06/21/2015  . TIA (transient ischemic attack) 03/24/2015  . CAD (coronary artery disease) 12/26/2014  . COPD (chronic obstructive pulmonary disease) ([place]) 12/26/2014  . Hypothyroidism 12/26/2014  . Cerebral embolism with cerebral infarction 12/25/2014   Atrial fibrillation benign lesion-currently on Eliquis Hx CAD/acute diastolic CHF Right IJ for acute DVT- Xarelto per Dr. Monica Martinez Hypothyroid - on Synthroid Left pleural effusion Hx of hypercholesterolemia Protein calorie malnutrition-prealbumin 6.2>>6.8 (5/24)>>12.4 (6/1)  Small bowel obstruction with ischemic bowel . Exploratory laparotomy, small bowel resection,511/19/2020, Dr. Rolm Bookbinder  Exploratory laparotomy, ileocecectomy with ileocolonic anastomosis, abdominal closure5/21/2020,Dr. Rolm Bookbinder   FEN:  cardiac diet  ID: Zosyn 5/29 >>day5 (d/c 6/2) ION:[id number] Follow up: Dr. Donne Hazel  Disposition:abd is stable. Will d/c imodium now. Cont metamucil. Cont current wound care  Still stable for CIR from our standpoint   LOS: 18 days    Leighton Ruff. Redmond Pulling, MD, FACS General, Bariatric, & Minimally Invasive Surgery [phone] [facility]

## 2018-08-09 NOTE — Progress Notes (Signed)
CSW called and left a message with the patient's son. Peer to peer is going to be completed today by Dr. Posey Pronto. If the patient is not able to go to CIR, the patient will have to go to SNF or home with home health.   CSW will continue to follow and assist with disposition planning.   Domenic Schwab, MSW, [place]

## 2018-08-09 NOTE — Progress Notes (Signed)
Occupational Therapy Treatment Patient Details Name: Rachel Park MRN: [id number] DOB: 1931/04/16 Today's Date: 08/09/2018    History of present illness 77 yof with SBO, failed medical management, taken to OR with findings of necrotic bowel s/p resection.  Abd left open with wound vac and returns to ICU on mechanical ventilation. Extubated 07/25/18   OT comments  Pt progressing well. Pt performing bed mobility with minA for trunk elevation; transfers with minguardA. Pt performing ADL functional mobility with RW and fair balance at sink x5 mins for grooming. Pt ambulating well and performing own ADLs with decreased caregiver assist to minA overall. Pt performing figure 4 technique for LB dressing. Pt progressing well with therapy, but requiring dressing changes to stomach x3 times daily. Pt has family/neighbors to assist, but may not be 24/7. Pt would benefit from continued OT skilled services to be independent prior to d/c home with ADL and mobility. OT following acutely.  HR 90-115 BPM   Follow Up Recommendations  CIR;Supervision/Assistance - 24 hour(Likely to progress to [facility] therapies, but needs RN for wounds)    Equipment Recommendations       Recommendations for Other Services      Precautions / Restrictions Precautions Precautions: Fall Precaution Comments: abdominal incision. heart pillow in room to splint incision       Mobility Bed Mobility Overal bed mobility: Needs Assistance Bed Mobility: Rolling;Sidelying to Sit Rolling: Min guard Sidelying to sit: Min assist   Sit to supine: Min assist   General bed mobility comments: pt log rolling and using pillow for incision stability. Pt tolerating well.  Transfers Overall transfer level: Needs assistance Equipment used: Rolling walker (2 wheeled) Transfers: Sit to/from Stand Sit to Stand: Min guard         General transfer comment: hand placement cues    Balance Overall balance assessment: Needs assistance    Sitting balance-Leahy Scale: Fair       Standing balance-Leahy Scale: Fair Standing balance comment: dynamic standing at sink                           ADL either performed or assessed with clinical judgement   ADL Overall ADL's : Needs assistance/impaired     Grooming: Set up;Wash/dry hands;Wash/dry face;Oral care                   Toilet Transfer: Min guard;Ambulation;RW Toilet Transfer Details (indicate cue type and reason): able to perform without straining abdomen incision Toileting- Clothing Manipulation and Hygiene: Min guard;Sitting/lateral lean;Sit to/from stand       Functional mobility during ADLs: Min guard;Rolling walker General ADL Comments: Pt education and handout provided on energy conservation. Pt stood at sink x5 mins for grooming tasks, fair balance with RW.     Vision   Vision Assessment?: No apparent visual deficits   Perception     Praxis      Cognition Arousal/Alertness: Awake/alert Behavior During Therapy: WFL for tasks assessed/performed Overall Cognitive Status: Within Functional Limits for tasks assessed                                 General Comments: Pt following all commands with no delay.        Exercises General Exercises - Lower Extremity Ankle Circles/Pumps: AROM;Both;10 reps;Seated Long Arc Quad: 10 reps;Both;Seated;AROM   Shoulder Instructions       General Comments HR 90-116  BPM with activity    Pertinent Vitals/ Pain       Pain Assessment: 0-10 Pain Score: 2  Pain Location: abdomen Pain Descriptors / Indicators: Guarding;Discomfort Pain Intervention(s): Monitored during session  Home Living                                          Prior Functioning/Environment              Frequency  Min 2X/week        Progress Toward Goals  OT Goals(current goals can now be found in the care plan section)  Progress towards OT goals: Progressing toward  goals  Acute Rehab OT Goals Patient Stated Goal: to be independent again OT Goal Formulation: With patient Time For Goal Achievement: 08/15/18 Potential to Achieve Goals: Good ADL Goals Pt Will Perform Grooming: with modified independence;standing;sitting Pt Will Perform Upper Body Dressing: with modified independence;sitting Pt Will Perform Lower Body Dressing: with modified independence;with adaptive equipment;sit to/from stand Pt Will Transfer to Toilet: with modified independence;ambulating Pt Will Perform Toileting - Clothing Manipulation and hygiene: with modified independence;sit to/from stand;with adaptive equipment Additional ADL Goal #1: Pt will recall 3 energy conservation strategies for ADL at independent level  Plan Discharge plan remains appropriate    Co-evaluation                 AM-PAC OT "6 Clicks" Daily Activity     Outcome Measure   Help from another person eating meals?: None Help from another person taking care of personal grooming?: A Little Help from another person toileting, which includes using toliet, bedpan, or urinal?: A Little Help from another person bathing (including washing, rinsing, drying)?: A Little Help from another person to put on and taking off regular upper body clothing?: A Little Help from another person to put on and taking off regular lower body clothing?: A Little 6 Click Score: 19    End of Session Equipment Utilized During Treatment: Rolling walker  OT Visit Diagnosis: Unsteadiness on feet (R26.81);Other abnormalities of gait and mobility (R26.89);Muscle weakness (generalized) (M62.81);Pain   Activity Tolerance Patient tolerated treatment well   Patient Left in bed;with call bell/phone within reach;with bed alarm set   Nurse Communication Mobility status        Time: 0034-9179 OT Time Calculation (min): 19 min  Charges: OT General Charges $OT Visit: 1 Visit OT Treatments $Self Care/Home Management : 8-22  mins  Darryl Nestle) Marsa Aris OTR/L Acute Rehabilitation Services Pager: [phone] Office: [phone]   Audie Pinto 08/09/2018, 2:47 PM

## 2018-08-10 LAB — CBC
HCT: 28.1 % — ABNORMAL LOW (ref 36.0–46.0)
Hemoglobin: 9.3 g/dL — ABNORMAL LOW (ref 12.0–15.0)
MCH: 30.4 pg (ref 26.0–34.0)
MCHC: 33.1 g/dL (ref 30.0–36.0)
MCV: 91.8 fL (ref 80.0–100.0)
Platelets: 679 10*3/uL — ABNORMAL HIGH (ref 150–400)
RBC: 3.06 MIL/uL — ABNORMAL LOW (ref 3.87–5.11)
RDW: 15.5 % (ref 11.5–15.5)
WBC: 14 10*3/uL — ABNORMAL HIGH (ref 4.0–10.5)
nRBC: 0 % (ref 0.0–0.2)

## 2018-08-10 LAB — BASIC METABOLIC PANEL
Anion gap: 9 (ref 5–15)
BUN: 20 mg/dL (ref 8–23)
CO2: 25 mmol/L (ref 22–32)
Calcium: 8.2 mg/dL — ABNORMAL LOW (ref 8.9–10.3)
Chloride: 101 mmol/L (ref 98–111)
Creatinine, Ser: 0.55 mg/dL (ref 0.44–1.00)
GFR calc Af Amer: >60 mL/min (ref >60)
GFR calc non Af Amer: >60 mL/min (ref >60)
Glucose, Bld: 96 mg/dL (ref 70–99)
Potassium: 3.7 mmol/L (ref 3.5–5.1)
Sodium: 135 mmol/L (ref 135–145)

## 2018-08-10 LAB — GLUCOSE, CAPILLARY
Glucose-Capillary: 93 mg/dL (ref 70–99)
Glucose-Capillary: 99 mg/dL (ref 70–99)
Glucose-Capillary: 99 mg/dL (ref 70–99)
Glucose-Capillary: 100 mg/dL — ABNORMAL HIGH (ref 70–99)

## 2018-08-10 MED ORDER — ADULT MULTIVITAMIN W/MINERALS CH: 1.0000 | ORAL_TABLET | Freq: Every day | ORAL | Status: DC

## 2018-08-10 MED ORDER — PSYLLIUM 95 % PO PACK: 1.0000 | PACK | Freq: Two times a day (BID) | ORAL | Status: DC

## 2018-08-10 MED ORDER — PRO-STAT SUGAR FREE PO LIQD: 30.0000 mL | Freq: Two times a day (BID) | ORAL | 0 refills | Status: DC

## 2018-08-10 MED ORDER — OXYCODONE HCL 5 MG PO TABS: 2.5000 mg | ORAL_TABLET | ORAL | 0 refills | Status: AC | PRN

## 2018-08-10 NOTE — Progress Notes (Signed)
Abdominal dressing was changed per MD order. Patient tolerated well. Will continue to monitor.

## 2018-08-10 NOTE — Progress Notes (Addendum)
Family Medicine Teaching Service Daily Progress Note Intern Pager: [phone]  Patient name: Rachel Park Medical record number: [id number] Date of birth: 26-Feb-1932 Age: 85 y.o. Gender: female  Primary Care Provider: Lajean Manes, MD Consultants: CCM (s/o), gen surg, cardiology, VVS Code Status: full  Pt Overview and Major Events to Date:  5/18 admitted to [facility] 5/19 ex lap, underwent colonic resection 5/21 underwent ileocolic anastomosis 1/28 extubated 5/24 out to floor, care resumed by FPTS 5/28 - worsening dehiscence of wound 5/29 - CT abd/CXR 6/5: s/p Peer-to-peer --> insurance declined CIR, family opts for [facility]  Assessment and Plan: 85 year old female who presented with severe abdominal pain.  Found to have ischemic bowel on ex lap, bowel resected and underwent reanastomosis.  Has developed ileus postoperatively, extubated on 5/22 and transferred out of floor on 5/24.  Patiently is appropriate for transfer to CIR.  Wound Dehiscence s/p ileocolic anastomosis  By surgery, notes she is stable for discharge to CIR. S/p Zosyn 5/29 to 6/2.  Patient continues remain afebrile with normal vitals.  Peer-to-peer conference on held on 6/5 with patient's insurance and she was denied CIR placement. Patient's son opted for home health services. CSW working with patient's son to arrange this. Tolerating soft diet well. WBC 11.3>14. -Surgery following appreciate recs - electrolyte repletion per pharmacy - advance to cardiac diet -Abdominal binder when ambulating -3 times daily when dressing changes per surgery -Oxycodone 2.5-5mg  every 4 hours as needed -Tylenol 1 g every 6 hours -Robaxin 500 mg every 8 hours as needed per surgery -DC with Home health, CSW working on arranging this, will follow up - follow up PT/OT recs - f/u up with cards and surgery concerning HH placement  Possible Post-Op Ileus: Resolved 2 stools yesterday, 1 this morning.  Tolerating soft diet. Imodium discontinued per  surgery. Improvement in stools with Metamucil - advance to cardiac diet - cont metamucil  Volume overload: resolved Patient initially overloaded likely secondary to a number of factors including poor blood protein and receiving a lot of fluid during admission.  Cardiology following and discontinue torsemide given volume status appeared down. Trace lower extremity edema bilaterally. - Cardiology following, appreciate recommendations - Diuresis per cards recs - K-Dur 30 mEq QD per cardiology  Atrial fibrillation Home meds: Apixaban 5mg  BID, Diltiazem 180mg  QD, Lopressor 25mg  BID. HR 90-100 overnight. Slightly elevated, expect 2/2 to some volume depletion. - Per cards, hold torsemide due to volume status and monitor HR + consider increase in Dilt if HR stable -Continue Cardizem CD 180 mg daily, metoprolol 25 mg twice daily -Continue apixaban 5 mg twice daily -Daily BMPns -Likely candidate for cardioversion af -Appreciate cardiology recommendatioter recovers from inpatient ailments  Right IJ and right brachial DVT: improved RLE swelling resolved. Denies any pain. Expected likely likely catheter associated as she has had a right IJ catheter with tip terminating right subclavian vein. Per VVS, no further management required given patient already on Eliquis 5mg  BID.    Insomnia: Improved - Ramelteon qhs - encourage out of bed    Small left pleural effusion  Likely secondary to atelectasis.   - incentive spirometry - Diuresis per above - continue to monitor for fever or leukocytosis, repeat CXR if develops  Hypothyroidism: Home meds: Synthroid 23mcg QD - continue home med  Normocytic anemia: stable  Likely secondary to blood loss and poor nutrition.  Stable at 9.3. -cont to monitor CBC  FEN/GI:  Cardiac diet PPx: Pantoprazole 40 mg, Eliquis 5 mg twice daily  Disposition:  Home with home health  Subjective:  Patient comfortably this morning.  Denies any chest pain, shortness of  breath, abdominal pain.  Tolerating soft diet well, thought she was going to get to have a cardiac diet but this was never ordered for her.  Notes her diarrhea is improved and is very happy with the Metamucil.  She is disappointed that inpatient rehab was denied by her insurance.  But she also looks forward to going home.    Objective: Temp:  [97.5 F (36.4 C)-98.3 F (36.8 C)] 97.5 F (36.4 C) (06/06 0106) Pulse Rate:  [106-114] 109 (06/05 1815) Resp:  [15-21] 21 (06/05 1815) BP: (96-129)/(64-81) 102/64 (06/05 1815) SpO2:  [95 %-96 %] 96 % (06/05 1815) Weight:  [66.2 kg-66.4 kg] 66.2 kg (06/06 0320)  Physical Exam:  General: pleasant elderly female, NAD, lying comfortably in bed  CV: irregularly irregular,  Trace LE edema bilaterally, 2+ radial and pedal pulses bilterally Lungs: clear to auscultation bilaterally with normal work of breathing Abdomen: large midline abdominal incision with large bandage, no blood or drainage appreciated when inspected, bandage clean and intact as well, normoactive bowel sounds Skin: warm, dry Extremities: warm and well perfused Neuro: Alert and oriented, speech normal  Laboratory: Recent Labs  Lab 08/08/18 0436 08/09/18 0343 08/10/18 0421  WBC 11.6* 11.3* 14.0*  HGB 9.6* 9.0* 9.3*  HCT 29.2* 27.6* 28.1*  PLT 670* 639* 679*   Recent Labs  Lab 08/05/18 0346  08/08/18 0436 08/09/18 0343 08/10/18 0421  NA 132*   < > 134* 134* 135  K 3.2*   < > 3.8 4.2 3.7  CL 98   < > 95* 98 101  CO2 27   < > 29 28 25   BUN 19   < > 23 26* 20  CREATININE 0.45   < > 0.58 0.67 0.55  CALCIUM 7.5*   < > 8.5* 8.3* 8.2*  PROT 4.3*  --   --   --   --   BILITOT 0.4  --   --   --   --   ALKPHOS 64  --   --   --   --   ALT 17  --   --   --   --   AST 15  --   --   --   --   GLUCOSE 116*   < > 95 94 96   < > = values in this interval not displayed.   No results found.  Mina Marble [place], DO 08/10/2018, 6:24 AM PGY-1, [place] Intern  pager: [phone], text pages welcome

## 2018-08-10 NOTE — Progress Notes (Signed)
Progress Note  Patient Name: Rachel Park Date of Encounter: 08/10/2018  Primary Cardiologist: Mertie Moores, MD   Subjective   Overall feels much better. Asymptomatic w/ Afib. Rates currently ~107. Reports good UOP and states that her LEE has improved a great deal. No resting dyspnea or CP.   We stopped her diuretics yesterday    HR is better today  She is very stable from a cardiac standpoint    Inpatient Medications    Scheduled Meds: . acetaminophen  1,000 mg Oral Q6H  . apixaban  5 mg Oral BID  . chlorhexidine  15 mL Mouth Rinse BID  . Chlorhexidine Gluconate Cloth  6 each Topical Daily  . diltiazem  180 mg Oral Daily  . feeding supplement (PRO-STAT SUGAR FREE 64)  30 mL Oral BID  . levothyroxine  75 mcg Oral QAC breakfast  . lip balm  1 application Topical BID  . mouth rinse  15 mL Mouth Rinse q12n4p  . metoprolol tartrate  25 mg Oral BID  . multivitamin with minerals  1 tablet Oral Daily  . pantoprazole  40 mg Oral Daily  . psyllium  1 packet Oral BID  . ramelteon  8 mg Oral QHS  . sodium chloride flush  10-40 mL Intracatheter Q12H  . sodium chloride flush  3 mL Intravenous Q12H   Continuous Infusions: . sodium chloride     PRN Meds: sodium chloride, alum & mag hydroxide-simeth, bismuth subsalicylate, guaiFENesin-dextromethorphan, hydrocortisone, hydrocortisone cream, menthol-cetylpyridinium, methocarbamol, ondansetron **OR** ondansetron (ZOFRAN) IV, oxyCODONE, phenol, sodium chloride flush, sodium chloride flush   Vital Signs    Vitals:   08/10/18 0106 08/10/18 0320 08/10/18 0609 08/10/18 0752  BP:    120/61  Pulse:    (!) 101  Resp:    17  Temp: (!) 97.5 F (36.4 C)  98 F (36.7 C) 98 F (36.7 C)  TempSrc: Oral  Oral Oral  SpO2:    99%  Weight:  66.2 kg    Height:        Intake/Output Summary (Last 24 hours) at 08/10/2018 1146 Last data filed at 08/10/2018 0916 Gross per 24 hour  Intake 500 ml  Output 300 ml  Net 200 ml   Last 3 Weights  08/10/2018 08/09/2018 08/08/2018  Weight (lbs) 145 lb 15.1 oz 146 lb 6.2 oz 143 lb 11.8 oz  Weight (kg) 66.2 kg 66.4 kg 65.2 kg      Telemetry    Atrial fibrillation 99 - 105  bpm  - Personally Reviewed  ECG    Not performed today - Personally Reviewed  Physical Exam    Physical Exam: Blood pressure 120/61, pulse (!) 101, temperature 98 F (36.7 C), temperature source Oral, resp. rate 17, height 5' 2.5" (1.588 m), weight 66.2 kg, SpO2 99 %.  GEN:  Elderly female,  NAD  HEENT: Normal NECK: No JVD; No carotid bruits LYMPHATICS: No lymphadenopathy CARDIAC:  Irreg. Irreg.  RESPIRATORY:  Clear to auscultation without rales, wheezing or rhonchi  ABDOMEN: Soft, non-tender, non-distended MUSCULOSKELETAL:  No edema; No deformity  SKIN: Warm and dry NEUROLOGIC:  Alert and oriented x 3    Labs    Chemistry Recent Labs  Lab 08/05/18 0346  08/08/18 0436 08/09/18 0343 08/10/18 0421  NA 132*   < > 134* 134* 135  K 3.2*   < > 3.8 4.2 3.7  CL 98   < > 95* 98 101  CO2 27   < > 29 28 25  GLUCOSE 116*   < > 95 94 96  BUN 19   < > 23 26* 20  CREATININE 0.45   < > 0.58 0.67 0.55  CALCIUM 7.5*   < > 8.5* 8.3* 8.2*  PROT 4.3*  --   --   --   --   ALBUMIN 1.6*  --   --   --   --   AST 15  --   --   --   --   ALT 17  --   --   --   --   ALKPHOS 64  --   --   --   --   BILITOT 0.4  --   --   --   --   GFRNONAA >60   < > >60 >60 >60  GFRAA >60   < > >60 >60 >60  ANIONGAP 7   < > 10 8 9    < > = values in this interval not displayed.     Hematology Recent Labs  Lab 08/08/18 0436 08/09/18 0343 08/10/18 0421  WBC 11.6* 11.3* 14.0*  RBC 3.22* 3.01* 3.06*  HGB 9.6* 9.0* 9.3*  HCT 29.2* 27.6* 28.1*  MCV 90.7 91.7 91.8  MCH 29.8 29.9 30.4  MCHC 32.9 32.6 33.1  RDW 15.2 15.5 15.5  PLT 670* 639* 679*    Cardiac EnzymesNo results for input(s): TROPONINI in the last 168 hours. No results for input(s): TROPIPOC in the last 168 hours.   BNPNo results for input(s): BNP, PROBNP in the  last 168 hours.   DDimer No results for input(s): DDIMER in the last 168 hours.   Radiology    No results found.  Cardiac Studies   Echo 04/21/2018 IMPRESSIONS   1. The left ventricle has normal systolic function, with an ejection fraction of 55-60%. The cavity size was normal. Mild basal septal hypertrophy. Left ventricular diastology could not be evaluated secondary to atrial fibrillation. 2. The right ventricle has normal systolic function. The cavity was normal. There is no increase in right ventricular wall thickness. 3. Left atrial size was moderately dilated. 4. The mitral valve is normal in structure. 5. The tricuspid valve is normal in structure. Tricuspid valve regurgitation is mild-moderate. 6. The aortic valve is tricuspid Mild sclerosis of the aortic valve. 7. The pulmonic valve was normal in structure. Pulmonic valve regurgitation is mild by color flow Doppler. 8. Right atrial pressure is estimated at 3 mmHg.   RUE doppler 08/02/2018 Summary:  Right: Findings consistent with acute deep vein thrombosis involving the right internal jugular veins and right brachial veins. Findings consistent with acute superficial vein thrombosis involving the right basilic vein. PICC line is visualized and free of thrombus.  Left: No evidence of thrombosis in the subclavian.  Patient Profile        85 y.o. female with PMH of atrial fibrillation, renal infarct (04/2018) when off anticoagulation and CAD (stent to LAD 2016) presented on 5/18 with severe abdominal pain and found to have ischemic bowel, underwent exp lap and small bowel resection on 5/19. Ileocecectomy with ileocolonic anastomosis with abdominal wall closure on 5/21. Cardiology initially consulted for management of atrial fibrillation. Patient later developed volume overload. US revealed RUE DVT which was felt to be related to catheter.   Assessment & Plan    1. Acute on chronic diastolic CHF:  Has  improved.   Is off diuretics  I anticipate that she will go home off diuretics.   We can add  back as OP if needed.   2. Persistent atrial fibrillation:  continue eliquis ,  She is stable from an AF standpoint   3. Ischemic bowel:  Management per surgery   4. Possible liver lesion:  Further work up per Int. Medicine team and surgical team .   5. H/O Renal infarct: occured in Feb 2020 when off anticoagulation. Now on Eliquis for secondary prevention as well as for management of acute DVT.   6. RUE DVT: noted on [place] 5/29. On anticoagulation. Felt to be related to catheter placement  7. Hypokalemia: .    She appears to be stable from my standpoint.  She is tolerating her AFib fairly well.    We can discuss doing a cardioversion once she is over this hospitalization .         Mertie Moores, MD  08/10/2018 11:46 AM    [facility],  [address] [place], [place]  [address] Pager [phone] Phone: [phone]; Fax: [phone]

## 2018-08-10 NOTE — Progress Notes (Signed)
16 Days Post-Op   Subjective/Chief Complaint: Pt doing well with no c/o Tol PO  BMs   Objective: Vital signs in last 24 hours: Temp:  [97.5 F (36.4 C)-98.3 F (36.8 C)] 98 F (36.7 C) (06/06 0752) Pulse Rate:  [101-114] 101 (06/06 0752) Resp:  [15-21] 17 (06/06 0752) BP: (96-129)/(61-81) 120/61 (06/06 0752) SpO2:  [95 %-99 %] 99 % (06/06 0752) Weight:  [66.2 kg] 66.2 kg (06/06 0320) Last BM Date: 08/08/18  Intake/Output from previous day: No intake/output data recorded. Intake/Output this shift: Total I/O In: 500 [P.O.:500] Out: 300 [Urine:300]  General appearance: alert and cooperative GI: soft, non-tender; bowel sounds normal; no masses,  no organomegaly and wound c/d/i, [place]  Lab Results:  Recent Labs    08/09/18 0343 08/10/18 0421  WBC 11.3* 14.0*  HGB 9.0* 9.3*  HCT 27.6* 28.1*  PLT 639* 679*   BMET Recent Labs    08/09/18 0343 08/10/18 0421  NA 134* 135  K 4.2 3.7  CL 98 101  CO2 28 25  GLUCOSE 94 96  BUN 26* 20  CREATININE 0.67 0.55  CALCIUM 8.3* 8.2*   PT/INR No results for input(s): LABPROT, INR in the last 72 hours. ABG No results for input(s): PHART, HCO3 in the last 72 hours.  Invalid input(s): PCO2, PO2  Studies/Results: No results found.  Anti-infectives: Anti-infectives (From admission, onward)   Start     Dose/Rate Route Frequency Ordered Stop   08/02/18 1600  piperacillin-tazobactam (ZOSYN) IVPB 3.375 g     3.375 g 12.5 mL/hr over 240 Minutes Intravenous Every 8 hours 08/02/18 1554 08/07/18 0306   07/25/18 1000  ceFAZolin (ANCEF) IVPB 2g/100 mL premix     2 g 200 mL/hr over 30 Minutes Intravenous To Short Stay 07/25/18 0749 07/25/18 1200   07/25/18 0745  ceFAZolin (ANCEF) IVPB 2g/100 mL premix  Status:  Discontinued     2 g 200 mL/hr over 30 Minutes Intravenous  Once 07/25/18 0735 07/25/18 0749   07/23/18 1000  cefoTEtan (CEFOTAN) 1 g in sodium chloride 0.9 % 100 mL IVPB     1 g 200 mL/hr over 30 Minutes  Intravenous  Once 07/23/18 0825 07/23/18 1117      Assessment/Plan: Patient Active Problem List   Diagnosis Date Noted  . Acute deep vein thrombosis (DVT) of upper extremity ([place])   . Ileus ([place])   . SBO (small bowel obstruction) ([place]) 07/23/2018  . Small bowel obstruction ([place]) 07/22/2018  . Atrial fibrillation ([place]) 05/20/2018  . Renal infarct ([place])   . Hyperlipidemia 11/27/2016  . Bilateral inguinal hernia without obstruction or gangrene 03/07/2016  . Incisional hernia, without obstruction or gangrene 03/07/2016  . Persistent atrial fibrillation 06/21/2015  . TIA (transient ischemic attack) 03/24/2015  . CAD (coronary artery disease) 12/26/2014  . COPD (chronic obstructive pulmonary disease) ([place]) 12/26/2014  . Hypothyroidism 12/26/2014  . Cerebral embolism with cerebral infarction 12/25/2014   Atrial fibrillation benign lesion-currently on Eliquis Hx CAD/acute diastolic CHF Right IJ for acute DVT- Xarelto per Dr. Monica Martinez Hypothyroid - on Synthroid Left pleural effusion Hx of hypercholesterolemia Protein calorie malnutrition-prealbumin 6.2>>6.8 (5/24)>>12.4 (6/1)  Small bowel obstruction with ischemic bowel . Exploratory laparotomy, small bowel resection,511/19/2020, Dr. Rolm Bookbinder  Exploratory laparotomy, ileocecectomy with ileocolonic anastomosis, abdominal closure5/21/2020,Dr. Rolm Bookbinder   FEN:  cardiac diet  ID: Zosyn 5/29 >>day5 (d/c 6/2) [id number] Follow up: Dr. Donne Hazel  Disposition:abd is stable. con't wound care Still stable for DC from our standpoint  LOS: 19 days    Rachel Park 08/10/2018

## 2018-08-10 NOTE — Progress Notes (Signed)
Abdominal dressing was changed second time this shift per MD order with minimal patient's participation. Writer educated/show to patient how to change the dressing; will continue to monitor and educate the patient.

## 2018-08-10 NOTE — Care Management (Addendum)
1:30 Spoke w patient's son Dr Rachel Park on the phone. He was surprised that she had a DC order as he was just made aware yesterday that she was denied from CIR and sent [place] options after 5pm. He confirms that he does not want her to go to SNF.He stated that with her dressing changes three times a day and needing to educate providers at home to do dressing changes the earliest that she would be able to come home would be Monday. He believes the plan would be for her to go home to her house, she lives alone.  Focused conversation on what we could accomplish today. Discussed need for DME RW and 3/1. Dr Rachel Park stated that he would be able to transport patient home, so equipment will be requested to be delivered to room today in preporation for DC. Dr Rachel Park stated that he is comfortable with [facility] from experience he has had with his own patients. Choice was confirmed with patient and referral made to [facility] w [facility]. Awaiting to hear back on status on referral.  No medication needs identified. Met with patient in room. She confirmed that her plan would be to go home and she lives by herself. We discussed HH and she is aware that a nurse would only come 2-3 times a week. She understands that family will need to be educated to perform dressing changes to open abdominal wound. She confirms that her dressing changes are three times a day and that she has not changed a dressing herself to this point. Discussed with bedside RN, Rachel Park that patient education would need done and to chart how well patient did changing dressing.   She states that she got up to the use the bathroom and for the first time today was able to wipe her backside herself. She does not feel medically stable for DC and would like to continue to work on goals of dressing changes, wound care, and stability ambulating to bathroom.   16:00 Spoke w Dr Rachel Park, patient's son, notified that [facility] would not be able to take, discussed other [facility] providers,  agreed upon referral to [facility]. Notified Rachel Park w [facility].    CM will continue to follow.

## 2018-08-11 LAB — CBC
HCT: 28 % — ABNORMAL LOW (ref 36.0–46.0)
Hemoglobin: 9.2 g/dL — ABNORMAL LOW (ref 12.0–15.0)
MCH: 29.9 pg (ref 26.0–34.0)
MCHC: 32.9 g/dL (ref 30.0–36.0)
MCV: 90.9 fL (ref 80.0–100.0)
Platelets: 678 10*3/uL — ABNORMAL HIGH (ref 150–400)
RBC: 3.08 MIL/uL — ABNORMAL LOW (ref 3.87–5.11)
RDW: 15.3 % (ref 11.5–15.5)
WBC: 10.3 10*3/uL (ref 4.0–10.5)
nRBC: 0 % (ref 0.0–0.2)

## 2018-08-11 LAB — BASIC METABOLIC PANEL
Anion gap: 8 (ref 5–15)
BUN: 16 mg/dL (ref 8–23)
CO2: 24 mmol/L (ref 22–32)
Calcium: 8 mg/dL — ABNORMAL LOW (ref 8.9–10.3)
Chloride: 102 mmol/L (ref 98–111)
Creatinine, Ser: 0.46 mg/dL (ref 0.44–1.00)
GFR calc Af Amer: >60 mL/min (ref >60)
GFR calc non Af Amer: >60 mL/min (ref >60)
Glucose, Bld: 93 mg/dL (ref 70–99)
Potassium: 3.5 mmol/L (ref 3.5–5.1)
Sodium: 134 mmol/L — ABNORMAL LOW (ref 135–145)

## 2018-08-11 LAB — GLUCOSE, CAPILLARY
Glucose-Capillary: 89 mg/dL (ref 70–99)
Glucose-Capillary: 89 mg/dL (ref 70–99)
Glucose-Capillary: 103 mg/dL — ABNORMAL HIGH (ref 70–99)

## 2018-08-11 NOTE — Progress Notes (Signed)
17 Days Post-Op  Subjective:  Doing well.  Has made excellent progress this past week.  Possibly discharge home tomorrow. Ambulating Tolerating diet Says she is only having 2 bowel movements a day which is very reasonable considering the intestinal resection that she endured Wound care has stabilized  I explained wound healing issues to her.  Eventually will completely heal by secondary intention but she will be left with a large ventral hernia.  Whether she chooses to have this repaired or simply managed with abdominal binder is a decision for the future  She is planning on going back to her home here in [place], possibly tomorrow, with home health support.  She says OT issues for safety have been taken care of.  Objective: Vital signs in last 24 hours: Temp:  [97.7 F (36.5 C)-98.6 F (37 C)] 97.8 F (36.6 C) (06/07 0739) Pulse Rate:  [86-91] 90 (06/06 1609) Resp:  [14-26] 20 (06/07 0739) BP: (106-140)/(69-87) 140/82 (06/07 0739) SpO2:  [95 %-97 %] 97 % (06/07 0739) Weight:  [65.8 kg] 65.8 kg (06/07 0226) Last BM Date: 08/10/18  Intake/Output from previous day: 06/06 0701 - 06/07 0700 In: 920 [P.O.:920] Out: 875 [Urine:875] Intake/Output this shift: Total I/O In: 260 [P.O.:260] Out: -    Exam: General: Alert.  No distress.  Mental status normal with excellent insight Lungs: Clear to auscultation Abdomen: Soft and nontender.  No distention.  Midline wound now with fairly healthy granulation tissue.  No bowel visible.  Some loose strands of PDS suture present which possibly will trimmed tomorrow or in office   Lab Results:  Results for orders placed or performed during the hospital encounter of 07/22/18 (from the past 24 hour(s))  Glucose, capillary     Status: None   Collection Time: 08/10/18 12:10 PM  Result Value Ref Range   Glucose-Capillary 99 70 - 99 mg/dL  Glucose, capillary     Status: None   Collection Time: 08/10/18  5:03 PM  Result Value Ref Range    Glucose-Capillary 99 70 - 99 mg/dL  Glucose, capillary     Status: Abnormal   Collection Time: 08/10/18  9:15 PM  Result Value Ref Range   Glucose-Capillary 100 (H) 70 - 99 mg/dL  CBC     Status: Abnormal   Collection Time: 08/11/18  3:51 AM  Result Value Ref Range   WBC 10.3 4.0 - 10.5 K/uL   RBC 3.08 (L) 3.87 - 5.11 MIL/uL   Hemoglobin 9.2 (L) 12.0 - 15.0 g/dL   HCT 28.0 (L) 36.0 - 46.0 %   MCV 90.9 80.0 - 100.0 fL   MCH 29.9 26.0 - 34.0 pg   MCHC 32.9 30.0 - 36.0 g/dL   RDW 15.3 11.5 - 15.5 %   Platelets 678 (H) 150 - 400 K/uL   nRBC 0.0 0.0 - 0.2 %  Basic metabolic panel     Status: Abnormal   Collection Time: 08/11/18  3:51 AM  Result Value Ref Range   Sodium 134 (L) 135 - 145 mmol/L   Potassium 3.5 3.5 - 5.1 mmol/L   Chloride 102 98 - 111 mmol/L   CO2 24 22 - 32 mmol/L   Glucose, Bld 93 70 - 99 mg/dL   BUN 16 8 - 23 mg/dL   Creatinine, Ser 0.46 0.44 - 1.00 mg/dL   Calcium 8.0 (L) 8.9 - 10.3 mg/dL   GFR calc non Af Amer >60 >60 mL/min   GFR calc Af Amer >60 >60 mL/min  Anion gap 8 5 - 15  Glucose, capillary     Status: None   Collection Time: 08/11/18  7:56 AM  Result Value Ref Range   Glucose-Capillary 89 70 - 99 mg/dL     Studies/Results: No results found.  Marland Kitchen acetaminophen  1,000 mg Oral Q6H  . apixaban  5 mg Oral BID  . chlorhexidine  15 mL Mouth Rinse BID  . Chlorhexidine Gluconate Cloth  6 each Topical Daily  . diltiazem  180 mg Oral Daily  . feeding supplement (PRO-STAT SUGAR FREE 64)  30 mL Oral BID  . levothyroxine  75 mcg Oral QAC breakfast  . lip balm  1 application Topical BID  . mouth rinse  15 mL Mouth Rinse q12n4p  . metoprolol tartrate  25 mg Oral BID  . multivitamin with minerals  1 tablet Oral Daily  . pantoprazole  40 mg Oral Daily  . psyllium  1 packet Oral BID  . ramelteon  8 mg Oral QHS  . sodium chloride flush  10-40 mL Intracatheter Q12H  . sodium chloride flush  3 mL Intravenous Q12H     Assessment/Plan: s/p  Procedure(s): Ileocecetomy Complex Wound Closure  Patient Active Problem List   Diagnosis Date Noted  . Acute deep vein thrombosis (DVT) of upper extremity ([place])   . Ileus ([employer])   . SBO (small bowel obstruction) ([place]) 07/23/2018  . Small bowel obstruction ([place]) 07/22/2018  . Atrial fibrillation ([place]) 05/20/2018  . Renal infarct ([place])   . Hyperlipidemia 11/27/2016  . Bilateral inguinal hernia without obstruction or gangrene 03/07/2016  . Incisional hernia, without obstruction or gangrene 03/07/2016  . Persistent atrial fibrillation 06/21/2015  . TIA (transient ischemic attack) 03/24/2015  . CAD (coronary artery disease) 12/26/2014  . COPD (chronic obstructive pulmonary disease) ([place]) 12/26/2014  . Hypothyroidism 12/26/2014  . Cerebral embolism with cerebral infarction 12/25/2014   Atrial fibrillation benign lesion-currently on Eliquis Hx CAD/acute diastolic CHF Right IJ for acute DVT- Xarelto per Dr. Monica Martinez Hypothyroid - on Synthroid Left pleural effusion Hx of hypercholesterolemia Protein calorie malnutrition-prealbumin 6.2>>6.8 (5/24)>>12.4 (6/1)  Small bowel obstruction with ischemic bowel . Exploratory laparotomy, small bowel resection,511/19/2020, Dr. Rolm Bookbinder  Exploratory laparotomy, ileocecectomy with ileocolonic anastomosis, abdominal closure5/21/2020,Dr. Rolm Bookbinder   FEN: cardiac diet  ID: Zosyn 5/29 >>day5 (d/c 6/2) [id number] Follow up: Dr. Donne Hazel  Disposition:abd is stable.wound carecan be twice daily at home, at most Possibly could cut out some of the PDS sutures tomorrow or in office Still stable for DC from our standpoint Obviously will need HHRN, PT,OT She will follow-up with Dr. Donne Hazel 2 weeks post discharge    @PROBHOSP @  LOS: 20 days    Adin Hector 08/11/2018  . .prob

## 2018-08-11 NOTE — Progress Notes (Signed)
Family Medicine Teaching Service Daily Progress Note Intern Pager: [phone]  Patient name: Rachel Park Medical record number: [id number] Date of birth: 1932-02-03 Age: 85 y.o. Gender: female  Primary Care Provider: Lajean Manes, MD Consultants: CCM (s/o), gen surg, cardiology, VVS Code Status: full  Pt Overview and Major Events to Date:  5/18 admitted to [facility] 5/19 ex lap, underwent colonic resection 5/21 underwent ileocolic anastomosis 7/67 extubated 5/24 out to floor, care resumed by FPTS 5/28 - worsening dehiscence of wound 5/29 - CT abd/CXR 6/5: s/p Peer-to-peer --> insurance declined CIR, family opts for [facility]  Assessment and Plan: 85 year old female who presented with severe abdominal pain.  Found to have ischemic bowel on ex lap, bowel resected and underwent reanastomosis.  Has developed ileus postoperatively, extubated on 5/22 and transferred out of floor on 5/24.   Wound Dehiscence s/p ileocolic anastomosis  By surgery, notes she is stable for discharge. S/p Zosyn 5/29 to 6/2.  Patient has remained afebrile with stable vital signs.  WBC 14>10.3.  She has been tolerating a cardiac diet well.  Patient was denied from CIR following peer-to-peer, now waiting for home health needs to be met.  Pending ability to have everything set up at home for patient, as she lives alone and will be having a lot of family support, will likely discharge on 6/8. -Surgery following appreciate recs - electrolyte repletion per pharmacy - cont cardiac diet -Abdominal binder when ambulating -3 times daily dressing changes per surgery -Oxycodone 2.5-23m every 4 hours as needed -Tylenol 1 g every 6 hours -Robaxin 500 mg every 8 hours as needed per surgery -Social work and care management involved with home health needs -We will likely DC on 6/8 pending ability to obtain all home health equipment and proper education with dressing changes  Volume overload: resolved Patient initially overloaded likely  secondary to a number of factors including poor blood protein and receiving a lot of fluid during admission.  Patient's diuresis was discontinued on 6/5.  Per cardiology note on 6/6, patient will likely go home off diuretics and can add back as outpatient as necessary.  On exam this a.m. BLE without edema and lungs CTAB. - Cardiology following, appreciate recommendations -Will likely DC without diuretics  Atrial fibrillation Home meds: Apixaban 575mBID, Diltiazem 18034mD, Lopressor 10m91mD.  HR 88-98 overnight, stable.  No changes yesterday per cardiology for A. fib. -Continue Cardizem CD 180 mg daily, metoprolol 25 mg twice daily -Continue apixaban 5 mg twice daily -Daily BMPs -Likely candidate for cardioversion when improved from surgical standpoint -Appreciate cardiology recommendatioter recovers from inpatient ailments  Right IJ and right brachial DVT: improved RLE swelling resolved. Denies any pain. Expected likely likely catheter associated as she has had a right IJ catheter with tip terminating right subclavian vein. Per VVS, no further management required given patient already on Eliquis 5mg 3m.    Insomnia: Improved - Ramelteon qhs - encourage out of bed    Small left pleural effusion  Likely secondary to atelectasis.   - incentive spirometry - Diuresis per above - continue to monitor for fever or leukocytosis, repeat CXR if develops  Hypothyroidism: Home meds: Synthroid 75mcg23m- continue home med  Normocytic anemia: stable  Likely secondary to blood loss and poor nutrition.  Stable at 9.2. -cont to monitor CBC  FEN/GI:  Cardiac diet PPx: Pantoprazole 40 mg, Eliquis 5 mg twice daily  Disposition: Home with home health, likely 6/8 pending equipment and ability to perform dressing changes  Subjective:  Patient denies any complaints this morning.  She states that her son, Dr. Donnetta Hutching, is working on getting all of the things ready at home for her to come home.  She  states that she would like to go home tomorrow, so that they have time to get things ready.  Objective: Temp:  [97.7 F (36.5 C)-98.6 F (37 C)] 97.8 F (36.6 C) (06/07 0739) Pulse Rate:  [86-91] 90 (06/06 1609) Resp:  [14-26] 20 (06/07 0739) BP: (106-140)/(69-87) 140/82 (06/07 0739) SpO2:  [95 %-97 %] 97 % (06/07 0739) Weight:  [65.8 kg] 65.8 kg (06/07 0226)  Physical Exam:  General: 85 y.o. female in NAD Cardio: RRR no m/r/g Lungs: CTAB, no wheezing, no rhonchi, no crackles, no IWOB on RA Abdomen: midline abdominal wound per surgery Skin: warm and dry Extremities: No edema   Laboratory: Recent Labs  Lab 08/09/18 0343 08/10/18 0421 08/11/18 0351  WBC 11.3* 14.0* 10.3  HGB 9.0* 9.3* 9.2*  HCT 27.6* 28.1* 28.0*  PLT 639* 679* 678*   Recent Labs  Lab 08/05/18 0346  08/09/18 0343 08/10/18 0421 08/11/18 0351  NA 132*   < > 134* 135 134*  K 3.2*   < > 4.2 3.7 3.5  CL 98   < > 98 101 102  CO2 27   < > _0 BUN 19   < > 26* 20 16  CREATININE 0.45   < > 0.67 0.55 0.46  CALCIUM 7.5*   < > 8.3* 8.2* 8.0*  PROT 4.3*  --   --   --   --   BILITOT 0.4  --   --   --   --   ALKPHOS 64  --   --   --   --   ALT 17  --   --   --   --   AST 15  --   --   --   --   GLUCOSE 116*   < > 94 96 93   < > = values in this interval not displayed.   No results found.  [place], DO 08/11/2018, 10:01 AM PGY-1, [place] Intern pager: [phone], text pages welcome

## 2018-08-11 NOTE — Progress Notes (Signed)
Abdominal dressing was changed per MD order. Patient tolerated well. Will continue to educate the patient with dressing change.

## 2018-08-11 NOTE — Progress Notes (Signed)
Patient able to ambulate in the hallway approximately 300 feet using the walker. No acute distress noted, no complaints. Will continue to monitor.

## 2018-08-11 NOTE — Progress Notes (Signed)
Patient walked again in the hallway 300 feet with no distress; also, the abdominal dressing was changed. Will continue to monitor.

## 2018-08-12 LAB — CBC
HCT: 28.7 % — ABNORMAL LOW (ref 36.0–46.0)
Hemoglobin: 9.5 g/dL — ABNORMAL LOW (ref 12.0–15.0)
MCH: 30.1 pg (ref 26.0–34.0)
MCHC: 33.1 g/dL (ref 30.0–36.0)
MCV: 90.8 fL (ref 80.0–100.0)
Platelets: 713 10*3/uL — ABNORMAL HIGH (ref 150–400)
RBC: 3.16 MIL/uL — ABNORMAL LOW (ref 3.87–5.11)
RDW: 15.3 % (ref 11.5–15.5)
WBC: 13.4 10*3/uL — ABNORMAL HIGH (ref 4.0–10.5)
nRBC: 0 % (ref 0.0–0.2)

## 2018-08-12 LAB — GLUCOSE, CAPILLARY
Glucose-Capillary: 122 mg/dL — ABNORMAL HIGH (ref 70–99)
Glucose-Capillary: 105 mg/dL — ABNORMAL HIGH (ref 70–99)

## 2018-08-12 LAB — BASIC METABOLIC PANEL
Anion gap: 6 (ref 5–15)
BUN: 16 mg/dL (ref 8–23)
CO2: 23 mmol/L (ref 22–32)
Calcium: 7.9 mg/dL — ABNORMAL LOW (ref 8.9–10.3)
Chloride: 104 mmol/L (ref 98–111)
Creatinine, Ser: 0.45 mg/dL (ref 0.44–1.00)
GFR calc Af Amer: >60 mL/min (ref >60)
GFR calc non Af Amer: >60 mL/min (ref >60)
Glucose, Bld: 96 mg/dL (ref 70–99)
Potassium: 3 mmol/L — ABNORMAL LOW (ref 3.5–5.1)
Sodium: 133 mmol/L — ABNORMAL LOW (ref 135–145)

## 2018-08-12 LAB — MAGNESIUM: Magnesium: 1.7 mg/dL (ref 1.7–2.4)

## 2018-08-12 MED ORDER — POTASSIUM CHLORIDE CRYS ER 20 MEQ PO TBCR
40.0000 meq | EXTENDED_RELEASE_TABLET | Freq: Once | ORAL | Status: AC
  Administered 2018-08-12: 40 meq via ORAL
  Filled 2018-08-12: qty 2

## 2018-08-12 NOTE — Consult Note (Signed)
   [facility] CM Inpatient Consult   08/12/2018  Rachel Park January 03, 1932 [id number]    Update Note:  Following patient's disposition to CIR but per transition of care CM note, patient was denied and with options for home health services at discharge since son confirmed that he does not want patient to go to SNF(skilled nursing facility). Patient had Wound dehiscence s/p ileocolic anastomosis and was monitored closely by surgery.  Plan for discharge per Transition of care CM is home with home health with [facility] and that [facility] has been accepted for [facility], PT, OT, Aide and SW and NO other CM needs determined at this time.  Per MD note today, states that [facility] RN and the patient's son (Dr. Sherren Mocha Early) will perform dressing changes at home (had discussed this situation)- BID dressings for at least the first couple of weeks if possible, then transition to once daily as the wound improves.   Spoke with patient over the hospital phone (HIPAA verified). She reports having no problem with medications (self-managed with "pillbox" filled weekly), pharmacy [facility] [place]), transportation (daughter in-law and next door neighbors); has housekeeper that cleans the house once a month, groceries done by next door neighbor. Patient lives alone and reports having a "life alert" device. Her neighbors check on her regularly for needs. Patient mentioned that she is not able to see her abdominal wound to do the dressing properly but [facility] RN and her family will be helping her with that. Patient is very much aware to monitor and record her blood pressure and weight after discharge and bring results to her provider visit.  Primary care provider is Dr. Lajean Manes with [facility] at [facility], listed as providing transition of care follow-up.  Patient agreed that she could benefit from [facility] calls to follow-up her continued recovery.   Referral made for [facility] General calls post discharge.    For  questionsor needs pleasecontact:   Edwena Felty A. Geryl Dohn, BSN, RN-BC [facility] Liaison Cell: [phone]

## 2018-08-12 NOTE — Care Management (Signed)
CM confirmed with pt that all equipment was delivered to beside on yesterday - pts son has already transported equipment to the home.  CM also confirmed with [facility] that [facility] has been accepted for [facility], PT, [place], [place] and SW - agency aware of discharge home today.   NO other CM needs determined at this time - CM signing off

## 2018-08-12 NOTE — Discharge Instructions (Signed)
[facility]      [facility] °[phone] ° °OPEN ABDOMINAL SURGERY: POST OP INSTRUCTIONS ° °Always review your discharge instruction sheet given to you by the facility where your surgery was performed. ° °IF YOU HAVE DISABILITY OR FAMILY LEAVE FORMS, YOU MUST BRING THEM TO THE OFFICE FOR PROCESSING.  PLEASE DO NOT GIVE THEM TO YOUR DOCTOR. ° °1. A prescription for pain medication may be given to you upon discharge.  Take your pain medication as prescribed, if needed.  If narcotic pain medicine is not needed, then you may take acetaminophen (Tylenol) or ibuprofen (Advil) as needed. °2. Take your usually prescribed medications unless otherwise directed. °3. If you need a refill on your pain medication, please contact your pharmacy. They will contact our office to request authorization.  Prescriptions will not be filled after 5pm or on week-ends. °4. You should follow a light diet the first few days after arrival home, such as soup and crackers, pudding, etc.unless your doctor has advised otherwise. A high-fiber, low fat diet can be resumed as tolerated.   Be sure to include lots of fluids daily. Most patients will experience some swelling and bruising on the chest and neck area.  Ice packs will help.  Swelling and bruising can take several days to resolve °5. Most patients will experience some swelling and bruising in the area of the incision. Ice pack will help. Swelling and bruising can take several days to resolve..  °6. It is common to experience some constipation if taking pain medication after surgery.  Increasing fluid intake and taking a stool softener will usually help or prevent this problem from occurring.  A mild laxative (Milk of Magnesia or Miralax) should be taken according to package directions if there are no bowel movements after 48 hours. °7.  You may have steri-strips (small skin tapes) in place directly over the incision.  These strips should be left on the skin for 7-10 days.  If your  surgeon used skin glue on the incision, you may shower in 24 hours.  The glue will flake off over the next 2-3 weeks.  Any sutures or staples will be removed at the office during your follow-up visit. You may find that a light gauze bandage over your incision may keep your staples from being rubbed or pulled. You may shower and replace the bandage daily. °8. ACTIVITIES:  You may resume regular (light) daily activities beginning the next day--such as daily self-care, walking, climbing stairs--gradually increasing activities as tolerated.  You may have sexual intercourse when it is comfortable.  Refrain from any heavy lifting or straining until approved by your doctor. °a. You may drive when you no longer are taking prescription pain medication, you can comfortably wear a seatbelt, and you can safely maneuver your car and apply brakes °b. Return to Work: ___________________________________ °9. You should see your doctor in the office for a follow-up appointment approximately two weeks after your surgery.  Make sure that you call for this appointment within a day or two after you arrive home to insure a convenient appointment time. °OTHER INSTRUCTIONS:  °_____________________________________________________________ °_____________________________________________________________ ° °WHEN TO CALL YOUR DOCTOR: °1. Fever over 101.0 °2. Inability to urinate °3. Nausea and/or vomiting °4. Extreme swelling or bruising °5. Continued bleeding from incision. °6. Increased pain, redness, or drainage from the incision. °7. Difficulty swallowing or breathing °8. Muscle cramping or spasms. °9. Numbness or tingling in hands or feet or around lips. ° °The clinic staff is available to   answer your questions during regular business hours.  Please don’t hesitate to call and ask to speak to one of the nurses if you have concerns. ° °For further questions, please visit [email or web address] ° °MIDLINE WOUND CARE: °- midline dressing  to be changed twice daily °- supplies: sterile saline, kerlix, scissors, ABD pads, tape  °- remove dressing and all packing carefully, moistening with sterile saline as needed to avoid packing/internal dressing sticking to the wound. °- clean edges of skin around the wound with water/gauze, making sure there is no tape debris or leakage left on skin that could cause skin irritation or breakdown. °- dampen and clean kerlix with sterile saline and pack wound from wound base to skin level, making sure to take note of any possible areas of wound tracking, tunneling and packing appropriately. Wound can be packed loosely. Trim kerlix to size if a whole kerlix is not required. °- cover wound with a dry ABD pad and secure with tape.  °- write the date/time on the dry dressing/tape to better track when the last dressing change occurred. °- apply any skin protectant/powder recommended by clinician to protect skin/skin folds. °- change dressing as needed if leakage occurs, wound gets contaminated, or patient requests to shower. °- patient may shower daily with wound open and following the shower the wound should be dried and a clean dressing placed.  ° °

## 2018-08-12 NOTE — Progress Notes (Addendum)
Progress Note  Patient Name: Rachel Park Date of Encounter: 08/12/2018  Primary Cardiologist: Mertie Moores, MD   Subjective   No significant overnight events. Patient denies any cardiac symptoms including chest pain, shortness of breath, palpitations, lightheadedness, or dizziness. Patient able to ambulate with PT/OT this morning without any difficulties. Per primary team, may be able to be discharged today.  Inpatient Medications    Scheduled Meds: . acetaminophen  1,000 mg Oral Q6H  . apixaban  5 mg Oral BID  . chlorhexidine  15 mL Mouth Rinse BID  . Chlorhexidine Gluconate Cloth  6 each Topical Daily  . diltiazem  180 mg Oral Daily  . feeding supplement (PRO-STAT SUGAR FREE 64)  30 mL Oral BID  . levothyroxine  75 mcg Oral QAC breakfast  . lip balm  1 application Topical BID  . mouth rinse  15 mL Mouth Rinse q12n4p  . metoprolol tartrate  25 mg Oral BID  . multivitamin with minerals  1 tablet Oral Daily  . pantoprazole  40 mg Oral Daily  . psyllium  1 packet Oral BID  . ramelteon  8 mg Oral QHS  . sodium chloride flush  10-40 mL Intracatheter Q12H  . sodium chloride flush  3 mL Intravenous Q12H   Continuous Infusions: . sodium chloride     PRN Meds: sodium chloride, alum & mag hydroxide-simeth, bismuth subsalicylate, guaiFENesin-dextromethorphan, hydrocortisone, hydrocortisone cream, menthol-cetylpyridinium, methocarbamol, ondansetron **OR** ondansetron (ZOFRAN) IV, oxyCODONE, phenol, sodium chloride flush, sodium chloride flush   Vital Signs    Vitals:   08/12/18 0154 08/12/18 0425 08/12/18 0551 08/12/18 0554  BP: (!) 127/99   137/83  Pulse: 99   82  Resp: 18   (!) 23  Temp:  97.9 F (36.6 C)    TempSrc:  Oral    SpO2: 98%   97%  Weight:   64.3 kg   Height:        Intake/Output Summary (Last 24 hours) at 08/12/2018 0913 Last data filed at 08/12/2018 0551 Gross per 24 hour  Intake 600 ml  Output 1200 ml  Net -600 ml   Filed Weights   08/10/18 0320  08/11/18 0226 08/12/18 0551  Weight: 66.2 kg 65.8 kg 64.3 kg    Telemetry    Atrial fibrillation with ventricular rates ranging from the 90's to 130's.  - Personally Reviewed  ECG    No new ECG tracing today. - Personally Reviewed  Physical Exam   GEN: Elderly Caucasian female resting comfortably. Alert and in no acute distress.   Neck: Supple.  Cardiac: Mildly tachycardic with irregularly irregular rhythm. No murmurs, gallops, or rubs. Radial pulses 2+ and equal bilaterally. Respiratory: Clear to auscultation bilaterally. No wheezes, rhonchi, or rales. GI: Abdomen soft, non-distended, and minimally tender tender. Bowel sounds present. Extremities: No lower extremity edema. No deformity.  Skin: Warm and dry. Neuro:  No focal deficits.  Psych: Normal affect. Responds appropriately.  Labs    Chemistry Recent Labs  Lab 08/10/18 0421 08/11/18 0351 08/12/18 0335  NA 135 134* 133*  K 3.7 3.5 3.0*  CL 101 102 104  CO2 25 24 23   GLUCOSE 96 93 96  BUN 20 16 16   CREATININE 0.55 0.46 0.45  CALCIUM 8.2* 8.0* 7.9*  GFRNONAA >60 >60 >60  GFRAA >60 >60 >60  ANIONGAP 9 8 6      Hematology Recent Labs  Lab 08/10/18 0421 08/11/18 0351 08/12/18 0335  WBC 14.0* 10.3 13.4*  RBC 3.06* 3.08* 3.16*  HGB 9.3* 9.2* 9.5*  HCT 28.1* 28.0* 28.7*  MCV 91.8 90.9 90.8  MCH 30.4 29.9 30.1  MCHC 33.1 32.9 33.1  RDW 15.5 15.3 15.3  PLT 679* 678* 713*    Cardiac EnzymesNo results for input(s): TROPONINI in the last 168 hours. No results for input(s): TROPIPOC in the last 168 hours.   BNPNo results for input(s): BNP, PROBNP in the last 168 hours.   DDimer No results for input(s): DDIMER in the last 168 hours.   Radiology    No results found.  Cardiac Studies   Echocardiogram 04/21/2018: Impressions: 1. The left ventricle has normal systolic function, with an ejection fraction of 55-60%. The cavity size was normal. Mild basal septal hypertrophy. Left ventricular diastology could  not be evaluated secondary to atrial fibrillation.  2. The right ventricle has normal systolic function. The cavity was normal. There is no increase in right ventricular wall thickness.  3. Left atrial size was moderately dilated.  4. The mitral valve is normal in structure.  5. The tricuspid valve is normal in structure. Tricuspid valve regurgitation is mild-moderate.  6. The aortic valve is tricuspid Mild sclerosis of the aortic valve.  7. The pulmonic valve was normal in structure. Pulmonic valve regurgitation is mild by color flow Doppler.  8. Right atrial pressure is estimated at 3 mmHg.  Patient Profile   Rachel Park is a 85 y.o. female with a history of CAD s/p stent to LAD in 2016, atrial fibrillation, recent renal infarct in 2/20 while off anticoagulation, CVA, hypertension, and hypothyroidism who presented on 07/22/2018 with severe abdominal pain and was found to have an ischemic bowel. Patient underwent explorative laparotomy and small bowel resection on 07/23/2018 and then ileocecectomy with ileocolonic anastomosis and abdominal wall closure on 07/25/2018. Cardiology was initially consulted for management of atrial fibrillation on 07/27/2018. Patient later developed volume overload. Ultrasound on 08/02/2018 revealed right upper extremity DVT which was felt to be related to catheter.  Assessment & Plan    Persistent Atrial Fibrillation - Telemetry shows atrial fibrillation with ventricular rates in the 90's to 130's this morning. Suspect rapid rates this morning due to working with PT/OT. - Continue Diltiazem 180mg  daily and  Lopressor 25mg  twice daily. - Continue Eliquis for anticoagulation. - Can discuss possible DCCV at outpatient follow-up.  Acute on Chronic Diastolic CHF - LVEF of 23-76% on Echo in 04/2018. - Torsemide was discontinue don 08/09/2018. Net negative 5 L since admission.  - Can add back diuretics as outpatient if needed. - Continue to monitor volume status closely.   Ischemic Bowel - Management per Surgery.  History of Renal Infarct - Occurred in 04/2018 when off anticoagulation. Now on Eliquis for secondary prevention as well as for management of acute DVT.   Right Upper Extremity DVT - Noted on ultrasound on 08/02/2018. Felt to be related to catheter placement.  - On Eliquis for anticoagulation.  Hypokalemia - Potassium 3.0 today. Will supplement with K-Dur 49mEq.  - Recheck BMET in the morning.  Otherwise, per primary team.  For questions or updates, please contact [place] Please consult [email or web address] for contact info under Cardiology/STEMI.      SignedDarreld Mclean, PA-C  08/12/2018, 9:13 AM   Pager: [phone]  Patient examined chart reviewed. Telemetry with good rate control of afib. On eliquis for afib and RUE DVT related to catheter. Per Dr Acie Fredrickson can f/u as outpatient with him and consider [facility] after recovery from surgery and 3 weeks of uninterrupted anticoagulation  Jenkins Rouge

## 2018-08-12 NOTE — Progress Notes (Signed)
Rachel Park to be D/C'd home per MD order. Discussed with the patient and son, Rachel Park, and all questions fully answered. VVS, Skin clean and dry without evidence of skin break down, no evidence of skin tears noted. Surgery changed dressing just before discharge. PICC line removed by IV team. An After Visit Summary was printed and given to the patient. Wound care supplies provided to last until next home health visit. Patient escorted via [place], and D/C home via private auto.  Rachel Park  08/12/2018 3:33 PM

## 2018-08-12 NOTE — Progress Notes (Signed)
Physical Therapy Treatment Patient Details Name: Rachel Park MRN: [id number] DOB: 1932/02/16 Today's Date: 08/12/2018    History of Present Illness 25 yof with SBO, failed medical management, taken to OR with findings of necrotic bowel s/p resection.  Abd left open with wound vac and returns to ICU on mechanical ventilation. Extubated 07/25/18    PT Comments    Patient seen for mobility progression. Pt continues to make progress toward PT goals and is mod I/Supervision level for mobility this session. Pt tolerated gait distance of 300 ft with RW. Continue to progress as tolerated.     Follow Up Recommendations  Home health PT;Supervision - Intermittent;Other (comment)(24 hour supervision/assistance initially upon d/c home)     Equipment Recommendations  Rolling walker with 5" wheels    Recommendations for Other Services       Precautions / Restrictions Precautions Precautions: Fall Precaution Comments: abdominal incision. heart pillow in room to splint incision Restrictions Weight Bearing Restrictions: No    Mobility  Bed Mobility Overal bed mobility: Modified Independent Bed Mobility: Rolling;Sit to Sidelying           General bed mobility comments: use of rail and increased time/effort; good "log roll" technique   Transfers Overall transfer level: Needs assistance Equipment used: Rolling walker (2 wheeled) Transfers: Sit to/from Stand Sit to Stand: Supervision         General transfer comment: supervision for safety  Ambulation/Gait Ambulation/Gait assistance: Supervision Gait Distance (Feet): 300 Feet Assistive device: Rolling walker (2 wheeled) Gait Pattern/deviations: Step-through pattern     General Gait Details: pt with improving stride length, posture, and cadence   Stairs             Wheelchair Mobility    Modified Rankin (Stroke Patients Only)       Balance Overall balance assessment: Needs assistance Sitting-balance support: No  upper extremity supported;Feet supported Sitting balance-Leahy Scale: Fair     Standing balance support: During functional activity Standing balance-Leahy Scale: Fair                              Cognition Arousal/Alertness: Awake/alert Behavior During Therapy: WFL for tasks assessed/performed Overall Cognitive Status: Within Functional Limits for tasks assessed                                        Exercises      General Comments General comments (skin integrity, edema, etc.): pt with Afib at baseline; HR noted to be into 130s at times; pt asymptomatic      Pertinent Vitals/Pain Pain Assessment: Faces Faces Pain Scale: No hurt Pain Intervention(s): Monitored during session    Home Living                      Prior Function            PT Goals (current goals can now be found in the care plan section) Acute Rehab PT Goals Patient Stated Goal: to be independent again Progress towards PT goals: Progressing toward goals    Frequency    Min 3X/week      PT Plan Current plan remains appropriate    Co-evaluation              AM-PAC PT "6 Clicks" Mobility   Outcome Measure  Help needed turning from your  back to your side while in a flat bed without using bedrails?: A Little Help needed moving from lying on your back to sitting on the side of a flat bed without using bedrails?: A Little Help needed moving to and from a bed to a chair (including a wheelchair)?: A Little Help needed standing up from a chair using your arms (e.g., wheelchair or bedside chair)?: A Little Help needed to walk in hospital room?: A Little Help needed climbing 3-5 steps with a railing? : A Little 6 Click Score: 18    End of Session Equipment Utilized During Treatment: Gait belt Activity Tolerance: Patient tolerated treatment well Patient left: with call bell/phone within reach;in bed Nurse Communication: Mobility status PT Visit Diagnosis:  Muscle weakness (generalized) (M62.81)     Time: 8003-4917 PT Time Calculation (min) (ACUTE ONLY): 16 min  Charges:  $Gait Training: 8-22 mins                     Earney Navy, PTA Acute Rehabilitation Services Pager: [phone] Office: [phone]     Darliss Cheney 08/12/2018, 10:05 AM

## 2018-08-12 NOTE — Progress Notes (Addendum)
Occupational Therapy Treatment Patient Details Name: Rachel Park MRN: [id number] DOB: 12-29-1931 Today's Date: 08/12/2018    History of present illness 67 yof with SBO, failed medical management, taken to OR with findings of necrotic bowel s/p resection.  Abd left open with wound vac and returns to ICU on mechanical ventilation. Extubated 07/25/18   OT comments  Pt making steady progress towards OT goals, presents seated in recliner pleasant and willing to participate in therapy session. Pt performing room level mobility using RW with minguard-supervision throughout. Completing LB dressing with minguard assist, standing grooming ADL x2 tasks with minguard progressed to supervision. Pt taking seated rest break after completion of standing ADL only due to elevated HR (up to 130s with activity, pt noted in Afib, asymptomatic throughout session). Discharge recommendations have been updated to reflect pt progress. Recommend follow up [facility] therapy services to continue progression of pt's safety and independence with ADL and mobility after return home. Will continue to follow acutely.   Follow Up Recommendations  Home health OT;Supervision/Assistance - 24 hour(24hr initially)    Equipment Recommendations  3 in 1 bedside commode          Precautions / Restrictions Precautions Precautions: Fall Precaution Comments: abdominal incision. heart pillow in room to splint incision Restrictions Weight Bearing Restrictions: No       Mobility Bed Mobility Overal bed mobility: Modified Independent Bed Mobility: Rolling;Sit to Sidelying           General bed mobility comments: Pt received OOB in recliner  Transfers Overall transfer level: Needs assistance Equipment used: Rolling walker (2 wheeled) Transfers: Sit to/from Stand Sit to Stand: Supervision         General transfer comment: supervision for safety    Balance Overall balance assessment: Needs assistance Sitting-balance support:  No upper extremity supported;Feet supported Sitting balance-Leahy Scale: Fair     Standing balance support: During functional activity Standing balance-Leahy Scale: Fair                             ADL either performed or assessed with clinical judgement   ADL Overall ADL's : Needs assistance/impaired     Grooming: Oral care;Brushing hair;Min guard;Supervision/safety;Standing Grooming Details (indicate cue type and reason): for standing balance; performing x2 tasks while standing at sink             Lower Body Dressing: Min guard;Sit to/from stand Lower Body Dressing Details (indicate cue type and reason): pt able to utilize figure 4 technique to don socks today; minguard-supervision for standing balance; educated pt to only use this technique if able to do so without putting strain on abdominal region, pt reports no pain/strain with task completion today       Toileting - Clothing Manipulation Details (indicate cue type and reason): pt reports she has now been able to perform peri-care without assist      Functional mobility during ADLs: Min guard;Supervision/safety;Rolling walker       Vision       Perception     Praxis      Cognition Arousal/Alertness: Awake/alert Behavior During Therapy: WFL for tasks assessed/performed Overall Cognitive Status: Within Functional Limits for tasks assessed                                          Exercises     Shoulder  Instructions       General Comments pt with Afib at baseline; HR up to the 130s with activity today, pt asymptomatic    Pertinent Vitals/ Pain       Pain Assessment: Faces Faces Pain Scale: No hurt Pain Intervention(s): Monitored during session  Home Living                                          Prior Functioning/Environment              Frequency  Min 2X/week        Progress Toward Goals  OT Goals(current goals can now be found in the  care plan section)  Progress towards OT goals: Progressing toward goals  Acute Rehab OT Goals Patient Stated Goal: to be independent again; home today OT Goal Formulation: With patient Time For Goal Achievement: 08/15/18 Potential to Achieve Goals: Good  Plan Discharge plan needs to be updated    Co-evaluation                 AM-PAC OT "6 Clicks" Daily Activity     Outcome Measure   Help from another person eating meals?: None Help from another person taking care of personal grooming?: None Help from another person toileting, which includes using toliet, bedpan, or urinal?: A Little Help from another person bathing (including washing, rinsing, drying)?: A Little Help from another person to put on and taking off regular upper body clothing?: A Little Help from another person to put on and taking off regular lower body clothing?: A Little 6 Click Score: 20    End of Session Equipment Utilized During Treatment: Gait belt;Rolling walker  OT Visit Diagnosis: Unsteadiness on feet (R26.81);Other abnormalities of gait and mobility (R26.89);Muscle weakness (generalized) (M62.81);Pain Pain - part of body: (abdomen)   Activity Tolerance Patient tolerated treatment well   Patient Left Other (comment)(hand off to PT to begin session)   Nurse Communication Mobility status        Time: 9675-9163 OT Time Calculation (min): 20 min  Charges: OT General Charges $OT Visit: 1 Visit OT Treatments $Self Care/Home Management : 8-22 mins  Lou Cal, OT [facility] Pager [phone] Office [phone]    Raymondo Band 08/12/2018, 1:25 PM

## 2018-08-12 NOTE — Progress Notes (Addendum)
[facility]/Trauma Progress Note  18 Days Post-Op   Assessment/Plan Atrial fibrillation benign lesion-currently on Eliquis Hx CAD/acute diastolic CHF Right IJ for acute DVT- Xarelto per Dr. Monica Martinez Hypothyroid - on Synthroid Left pleural effusion Hx of hypercholesterolemia Protein calorie malnutrition-prealbumin 6.2>>6.8 (5/24)>>12.4 (6/1)  Small bowel obstruction with ischemic bowel S/P Exploratory laparotomy, small bowel resection,511/19/2020, Dr. Rolm Bookbinder  Exploratory laparotomy, ileocecectomy with ileocolonic anastomosis, abdominal closure5/21/2020,Dr. Rolm Bookbinder   FEN: cardiac diet  ID: Zosyn 5/29 >>day5 (d/c 6/2) TDD:[id number] Follow up: Dr. Donne Hazel  Disposition:abd is stable.wound carecan be twice daily at home, Still stable forDCfrom our standpoint Obviously will need HHRN, PT,OT She will follow-up with Dr. Donne Hazel 2 weeks post discharge   LOS: 21 days    Subjective: CC: no complaints  Pt states she lives alone but her son and daughter in law are going to help her. She does not think she can change her midline dressing herself. She is unsure who will help her. I will discuss with Dr. Donnetta Hutching, son. She denies nausea, vomiting or abdominal pain. She is still having BM's but only one today.   Objective: Vital signs in last 24 hours: Temp:  [97.2 F (36.2 C)-98.2 F (36.8 C)] 97.2 F (36.2 C) (06/08 0900) Pulse Rate:  [82-104] 82 (06/08 0554) Resp:  [18-23] 23 (06/08 0554) BP: (121-137)/(76-99) 137/83 (06/08 0554) SpO2:  [97 %-100 %] 97 % (06/08 0554) Weight:  [64.3 kg] 64.3 kg (06/08 0551) Last BM Date: 08/12/18  Intake/Output from previous day: 06/07 0701 - 06/08 0700 In: 860 [P.O.:860] Out: 1200 [Urine:1200] Intake/Output this shift: No intake/output data recorded.  PE: Gen:  Alert, NAD, pleasant, cooperative Pulm:  Rate and effort normal Abd: Soft, NT/ND, +BS, wound see below Skin: no  rashes noted, warm and dry      Anti-infectives: Anti-infectives (From admission, onward)   Start     Dose/Rate Route Frequency Ordered Stop   08/02/18 1600  piperacillin-tazobactam (ZOSYN) IVPB 3.375 g     3.375 g 12.5 mL/hr over 240 Minutes Intravenous Every 8 hours 08/02/18 1554 08/07/18 0306   07/25/18 1000  ceFAZolin (ANCEF) IVPB 2g/100 mL premix     2 g 200 mL/hr over 30 Minutes Intravenous To Short Stay 07/25/18 0749 07/25/18 1200   07/25/18 0745  ceFAZolin (ANCEF) IVPB 2g/100 mL premix  Status:  Discontinued     2 g 200 mL/hr over 30 Minutes Intravenous  Once 07/25/18 0735 07/25/18 0749   07/23/18 1000  cefoTEtan (CEFOTAN) 1 g in sodium chloride 0.9 % 100 mL IVPB     1 g 200 mL/hr over 30 Minutes Intravenous  Once 07/23/18 0825 07/23/18 1117      Lab Results:  Recent Labs    08/11/18 0351 08/12/18 0335  WBC 10.3 13.4*  HGB 9.2* 9.5*  HCT 28.0* 28.7*  PLT 678* 713*   BMET Recent Labs    08/11/18 0351 08/12/18 0335  NA 134* 133*  K 3.5 3.0*  CL 102 104  CO2 24 23  GLUCOSE 93 96  BUN 16 16  CREATININE 0.46 0.45  CALCIUM 8.0* 7.9*   PT/INR No results for input(s): LABPROT, INR in the last 72 hours. CMP     Component Value Date/Time   NA 133 (L) 08/12/2018 0335   NA 133 (L) 02/21/2018 0817   K 3.0 (L) 08/12/2018 0335   CL 104 08/12/2018 0335   CO2 23 08/12/2018 0335   GLUCOSE 96 08/12/2018 0335   BUN 16 08/12/2018  0335   BUN 11 02/21/2018 0817   CREATININE 0.45 08/12/2018 0335   CREATININE 0.46 (L) 06/30/2015 0848   CALCIUM 7.9 (L) 08/12/2018 0335   PROT 4.3 (L) 08/05/2018 0346   PROT 6.0 02/21/2018 0817   ALBUMIN 1.6 (L) 08/05/2018 0346   ALBUMIN 4.1 02/21/2018 0817   AST 15 08/05/2018 0346   ALT 17 08/05/2018 0346   ALKPHOS 64 08/05/2018 0346   BILITOT 0.4 08/05/2018 0346   BILITOT 0.3 02/21/2018 0817   GFRNONAA >60 08/12/2018 0335   GFRAA >60 08/12/2018 0335   Lipase     Component Value Date/Time   LIPASE 27 07/22/2018 1903     Studies/Results: No results found.    Kalman Drape , [facility] 08/12/2018, 11:29 AM  Pager: [phone] Mon-Wed, Friday 7:00am-4:30pm Thurs 7am-11:30am  Consults: [phone]

## 2018-08-12 NOTE — Progress Notes (Addendum)
Family Medicine Teaching Service Daily Progress Note Intern Pager: [phone]  Patient name: Rachel Park Medical record number: [id number] Date of birth: 02-May-1931 Age: 85 y.o. Gender: female  Primary Care Provider: Lajean Manes, MD Consultants: CCM (s/o), gen surg, cardiology, VVS Code Status: full  Pt Overview and Major Events to Date:  5/18 admitted to [facility] 5/19 ex lap, underwent colonic resection 5/21 underwent ileocolic anastomosis 7/86 extubated 5/24 out to floor, care resumed by FPTS 5/28 - worsening dehiscence of wound 5/29 - CT abd/CXR 6/5: s/p Peer-to-peer --> insurance declined CIR, family opts for [facility]  Assessment and Plan: 85 year old female who presented with severe abdominal pain.  Found to have ischemic bowel on ex lap, bowel resected and underwent reanastomosis.  Has developed ileus postoperatively, extubated on 5/22 and transferred out of floor on 5/24.   Wound Dehiscence s/p ileocolic anastomosis  Per surgery, patient stable for discharge.  S/p Zosyn 5/29 to 6/2.  She continues to remain afebrile with stable vital signs.  WBC this a.m. 13.4.  [name] continues to tolerate a cardiac diet well.  She was kept over the weekend due to peer-to-peer denial for insurance coverage for CIR.  She is planning to go home with home health likely today if family has everything ready for her.  She has been requiring about 1 oxycodone a day. -Surgery following appreciate recs - electrolyte repletion per pharmacy - cont cardiac diet -Abdominal binder when ambulating -2 times daily dressing changes per surgery -Oxycodone 2.5-5mg  every 4 hours as needed -Tylenol 1 g every 6 hours -Robaxin 500 mg every 8 hours as needed per surgery -Social work and care management involved with home health needs -We will reach out to family today to confirm that home health needs are in place, likely discharge today  Hypokalemia K 3.0 this AM.   - K-Dur 40 x1 - check Mag - f/u BMP as  outpatient  Volume overload: resolved Patient initially overloaded likely secondary to a number of factors including poor blood protein and receiving a lot of fluid during admission.  Patient's diuresis was discontinued on 6/5.  Per cardiology note on 6/6, patient will likely go home off diuretics and can add back as outpatient as necessary.  On exam this a.m., she remains without lower extremity edema and lungs are clear to auscultation with patient on room air. - Cardiology following, appreciate recommendations -Will likely DC without diuretics  Atrial fibrillation Home meds: Apixaban 5mg  BID, Diltiazem 180mg  QD, Lopressor 25mg  BID.  Heart rate 82-104 in last 24 hours, stable for patient. -Continue Cardizem CD 180 mg daily, metoprolol 25 mg twice daily -Continue apixaban 5 mg twice daily -Daily BMPs -Likely candidate for cardioversion when improved from surgical standpoint -Appreciate cardiology recommendations  Right IJ and right brachial DVT: improved RLE swelling resolved. Denies any pain. Expected likely catheter associated as she has had a right IJ catheter with tip terminating right subclavian vein. Per VVS, no further management required given patient already on Eliquis 5mg  BID.    Insomnia: Improved - Ramelteon qhs - encourage out of bed    Small left pleural effusion  Likely secondary to atelectasis.   - incentive spirometry - Diuresis per above - continue to monitor for fever or leukocytosis, repeat CXR if develops  Hypothyroidism: Home meds: Synthroid 35mcg QD - continue home med  Normocytic anemia: stable  Likely secondary to blood loss and poor nutrition.  Stable at 9.5. -cont to monitor CBC  FEN/GI:  Cardiac diet PPx: Pantoprazole  40 mg, Eliquis 5 mg twice daily  Disposition: Home with home health, today  Subjective:  Patient denies any complaints this AM.  She states that her son has everything that she needs at home.  She states that she can be discharged  today, and that he will take her home this afternoon after his surgeries.  Objective: Temp:  [97.7 F (36.5 C)-98.2 F (36.8 C)] 97.9 F (36.6 C) (06/08 0425) Pulse Rate:  [82-104] 82 (06/08 0554) Resp:  [18-23] 23 (06/08 0554) BP: (121-137)/(76-99) 137/83 (06/08 0554) SpO2:  [97 %-100 %] 97 % (06/08 0554) Weight:  [64.3 kg] 64.3 kg (06/08 0551)  Physical Exam:  General: 85 y.o. female in NAD Cardio: irregular Lungs: CTAB, no wheezing, no rhonchi, no crackles, no IWOB on RA Abdomen: Midline abdominal wound with binder on Skin: warm and dry Extremities: No edema    Laboratory: Recent Labs  Lab 08/10/18 0421 08/11/18 0351 08/12/18 0335  WBC 14.0* 10.3 13.4*  HGB 9.3* 9.2* 9.5*  HCT 28.1* 28.0* 28.7*  PLT 679* 678* 713*   Recent Labs  Lab 08/10/18 0421 08/11/18 0351 08/12/18 0335  NA 135 134* 133*  K 3.7 3.5 3.0*  CL 101 102 104  CO2 25 24 23   BUN 20 16 16   CREATININE 0.55 0.46 0.45  CALCIUM 8.2* 8.0* 7.9*  GLUCOSE 96 93 96   No results found.  [place], DO 08/12/2018, 7:43 AM PGY-1, [place] Intern pager: [phone], text pages welcome

## 2018-08-13 DIAGNOSIS — Z4801 Encounter for change or removal of surgical wound dressing: Secondary | ICD-10-CM | POA: Diagnosis not present

## 2018-08-13 DIAGNOSIS — I10 Essential (primary) hypertension: Secondary | ICD-10-CM | POA: Diagnosis not present

## 2018-08-13 DIAGNOSIS — K460 Unspecified abdominal hernia with obstruction, without gangrene: Secondary | ICD-10-CM | POA: Diagnosis not present

## 2018-08-13 DIAGNOSIS — Z7901 Long term (current) use of anticoagulants: Secondary | ICD-10-CM | POA: Diagnosis not present

## 2018-08-13 DIAGNOSIS — E039 Hypothyroidism, unspecified: Secondary | ICD-10-CM | POA: Diagnosis not present

## 2018-08-13 DIAGNOSIS — Z48815 Encounter for surgical aftercare following surgery on the digestive system: Secondary | ICD-10-CM | POA: Diagnosis not present

## 2018-08-13 DIAGNOSIS — E785 Hyperlipidemia, unspecified: Secondary | ICD-10-CM | POA: Diagnosis not present

## 2018-08-13 DIAGNOSIS — I2510 Atherosclerotic heart disease of native coronary artery without angina pectoris: Secondary | ICD-10-CM | POA: Diagnosis not present

## 2018-08-13 DIAGNOSIS — I4819 Other persistent atrial fibrillation: Secondary | ICD-10-CM | POA: Diagnosis not present

## 2018-08-13 DIAGNOSIS — J449 Chronic obstructive pulmonary disease, unspecified: Secondary | ICD-10-CM | POA: Diagnosis not present

## 2018-08-15 ENCOUNTER — Inpatient Hospital Stay: Payer: Medicare HMO

## 2018-08-15 DIAGNOSIS — K460 Unspecified abdominal hernia with obstruction, without gangrene: Secondary | ICD-10-CM | POA: Diagnosis not present

## 2018-08-15 DIAGNOSIS — Z48815 Encounter for surgical aftercare following surgery on the digestive system: Secondary | ICD-10-CM | POA: Diagnosis not present

## 2018-08-15 DIAGNOSIS — I10 Essential (primary) hypertension: Secondary | ICD-10-CM | POA: Diagnosis not present

## 2018-08-15 DIAGNOSIS — Z7901 Long term (current) use of anticoagulants: Secondary | ICD-10-CM | POA: Diagnosis not present

## 2018-08-15 DIAGNOSIS — E039 Hypothyroidism, unspecified: Secondary | ICD-10-CM | POA: Diagnosis not present

## 2018-08-15 DIAGNOSIS — I2510 Atherosclerotic heart disease of native coronary artery without angina pectoris: Secondary | ICD-10-CM | POA: Diagnosis not present

## 2018-08-15 DIAGNOSIS — I4819 Other persistent atrial fibrillation: Secondary | ICD-10-CM | POA: Diagnosis not present

## 2018-08-15 DIAGNOSIS — Z4801 Encounter for change or removal of surgical wound dressing: Secondary | ICD-10-CM | POA: Diagnosis not present

## 2018-08-15 DIAGNOSIS — E785 Hyperlipidemia, unspecified: Secondary | ICD-10-CM | POA: Diagnosis not present

## 2018-08-15 DIAGNOSIS — J449 Chronic obstructive pulmonary disease, unspecified: Secondary | ICD-10-CM | POA: Diagnosis not present

## 2018-08-20 ENCOUNTER — Encounter: Payer: Self-pay | Admitting: *Deleted

## 2018-08-22 ENCOUNTER — Encounter: Payer: Self-pay | Admitting: Cardiology

## 2018-08-22 ENCOUNTER — Other Ambulatory Visit: Payer: Self-pay

## 2018-08-22 ENCOUNTER — Telehealth (INDEPENDENT_AMBULATORY_CARE_PROVIDER_SITE_OTHER): Payer: Medicare HMO | Admitting: Cardiology

## 2018-08-22 VITALS — BP 123/97 | HR 109 | Ht 62.5 in | Wt 131.0 lb

## 2018-08-22 DIAGNOSIS — I4819 Other persistent atrial fibrillation: Secondary | ICD-10-CM | POA: Diagnosis not present

## 2018-08-22 DIAGNOSIS — N280 Ischemia and infarction of kidney: Secondary | ICD-10-CM

## 2018-08-22 DIAGNOSIS — I5032 Chronic diastolic (congestive) heart failure: Secondary | ICD-10-CM

## 2018-08-22 DIAGNOSIS — Z9049 Acquired absence of other specified parts of digestive tract: Secondary | ICD-10-CM

## 2018-08-22 DIAGNOSIS — E785 Hyperlipidemia, unspecified: Secondary | ICD-10-CM

## 2018-08-22 DIAGNOSIS — I2510 Atherosclerotic heart disease of native coronary artery without angina pectoris: Secondary | ICD-10-CM

## 2018-08-22 DIAGNOSIS — I82621 Acute embolism and thrombosis of deep veins of right upper extremity: Secondary | ICD-10-CM

## 2018-08-22 MED ORDER — FUROSEMIDE 20 MG PO TABS: ORAL_TABLET | ORAL | 1 refills | Status: DC

## 2018-08-22 NOTE — Progress Notes (Signed)
Virtual Visit via Video Note   This visit type was conducted due to national recommendations for restrictions regarding the COVID-19 Pandemic (e.g. social distancing) in an effort to limit this patient's exposure and mitigate transmission in our community.  Due to her co-morbid illnesses, this patient is at least at moderate risk for complications without adequate follow up.  This format is felt to be most appropriate for this patient at this time.  All issues noted in this document were discussed and addressed.  A limited physical exam was performed with this format.  Please refer to the patient's chart for her consent to telehealth for [facility].   Date:  08/22/2018   ID:  Rachel Park, DOB October 08, 1931, MRN [id number]  Patient Location: Home Provider Location: Office  PCP:  Lajean Manes, MD  Cardiologist:  Mertie Moores, MD  Electrophysiologist:  Constance Haw, MD   Evaluation Performed:  Follow-Up Visit  Chief Complaint:  A fib  History of Present Illness:    Rachel Park is a 85 y.o. female with  a hx of atrial fib and CAD.  She failed to maintain NSR on Tikosyn or sotalol and has been treated with amiodarone and also is on Dilt and metoprolol at home as well as Eliquis.  She has had PAF and has had recent dose adjustments for break through episodes with rapid rate.  She has been seen in the [facility] and has seen Dr. Curt Bears.   CHA2DS2 - VASc score of 6.   The patient had exploratory lab 5/19.  She had necrotic bowel.  She returned to the OR for ileocolectomy and ileocolonic anastomosis.  She was in the ICU for vent management, extubated and improved.  She did have a fib with RVR. Improved with IV BB,  seh then developed volume overload and ws treated.  Ultrasound on 08/02/2018 revealed right upper extremity DVT which was felt to be related to catheter.  Hx of renal infarct 04/2018 while  off anticoagulants  Now with RUext DVT     D/c'd 08/12/18  By 08/10/18 she was stable  with her a fib and her diuretics were stopped.  D/c'd with dilt 180 mg daily, lopressor 25 BID eliquis and possible DCCV as outpt   Would need 3 weeks of eliquis.   She was diuresed and neg 5 L in hosp.  May need to add back her lasix    Today she is feeling great still in a fib by HR.  No chest pain and no SOB.  occ lower ext edema. Just at ankles.  We discussed DCCV and she would prefer to go back on amiodarone to see if this will convert her like it did last time.    She has been on eliquis since 07/30/18 now 3 weeks.  The patient does not have symptoms concerning for COVID-19 infection (fever, chills, cough, or new shortness of breath).    Past Medical History:  Diagnosis Date  . Arthritis   . Complication of anesthesia   . Coronary artery disease   . Hypertension   . Hypothyroidism   . PAF (paroxysmal atrial fibrillation) ([place])    on Xarelto for maybe a year, opted to stop  . PONV (postoperative nausea and vomiting)   . Renal infarction ([place])   . Small bowel obstruction ([place]) 06/2015  . Stroke [facility])    oct 2016  . TIA (transient ischemic attack) 03/24/2015  . Vertigo, benign positional    to be evaluated,  intermittent   Past Surgical History:  Procedure Laterality Date  . ABDOMINAL HYSTERECTOMY    . APPLICATION OF WOUND VAC N/A 07/23/2018   Procedure: APPLICATION OF WOUND VAC;  Surgeon: Rolm Bookbinder, MD;  Location: [place];  Service: General;  Laterality: N/A;  . BOWEL RESECTION N/A 07/23/2018   Procedure: SMALL BOWEL RESECTION;  Surgeon: Rolm Bookbinder, MD;  Location: [place];  Service: General;  Laterality: N/A;  . CARDIAC CATHETERIZATION N/A 12/24/2014   Procedure: Left Heart Cath and Coronary Angiography;  Surgeon: Sherren Mocha, MD;  Location: [place] CV LAB;  Service: Cardiovascular;  Laterality: N/A;  . CARDIOVERSION N/A 05/22/2018   Procedure: CARDIOVERSION;  Surgeon: Josue Hector, MD;  Location: [facility] ENDOSCOPY;  Service: Cardiovascular;  Laterality: N/A;  .  CATARACT EXTRACTION Bilateral   . COLONOSCOPY WITH PROPOFOL N/A 12/01/2013   Procedure: COLONOSCOPY WITH PROPOFOL;  Surgeon: Garlan Fair, MD;  Location: [facility] ENDOSCOPY;  Service: Endoscopy;  Laterality: N/A;  . COMPLEX WOUND CLOSURE  07/25/2018   Procedure: Complex Wound Closure;  Surgeon: Rolm Bookbinder, MD;  Location: [place];  Service: General;;  . Pennie Rushing  07/25/2018   Procedure: Ileocecetomy;  Surgeon: Rolm Bookbinder, MD;  Location: [place];  Service: General;;  . INCISIONAL HERNIA REPAIR N/A 03/10/2016   Procedure: INCISIONAL HERNIA REPAIR TIMES TWO;  Surgeon: Armandina Gemma, MD;  Location: [place];  Service: General;  Laterality: N/A;  . INGUINAL HERNIA REPAIR Bilateral 03/10/2016   Procedure: BILATERAL INGUINAL HERNIA REPAIRS;  Surgeon: Armandina Gemma, MD;  Location: [place];  Service: General;  Laterality: Bilateral;  . INSERTION OF MESH N/A 03/10/2016   Procedure: INSERTION OF MESH TO BILATERAL GROINS AND ABDOMEN;  Surgeon: Armandina Gemma, MD;  Location: [place];  Service: General;  Laterality: N/A;  . laparotomy  06/2015  . LAPAROTOMY N/A 07/23/2018   Procedure: EXPLORATORY LAPAROTOMY;  Surgeon: Rolm Bookbinder, MD;  Location: [place];  Service: General;  Laterality: N/A;  . LEFT HEART CATH AND CORONARY ANGIOGRAPHY N/A 09/27/2016   Procedure: Left Heart Cath and Coronary Angiography;  Surgeon: Sherren Mocha, MD;  Location: [place] CV LAB;  Service: Cardiovascular;  Laterality: N/A;  . TONSILLECTOMY       Current Meds  Medication Sig  . acetaminophen (TYLENOL) 500 MG tablet Take 500 mg by mouth every 6 (six) hours as needed for mild pain.   . Amino Acids-Protein Hydrolys (FEEDING SUPPLEMENT, PRO-STAT SUGAR FREE 64,) LIQD Take 30 mLs by mouth 2 (two) times daily.  Marland Kitchen apixaban (ELIQUIS) 5 MG TABS tablet Take 1 tablet (5 mg total) by mouth 2 (two) times daily.  Marland Kitchen CALCIUM PO Take 1 tablet by mouth daily.  . cholecalciferol (VITAMIN D) 1000 UNITS tablet Take 2,000 Units by mouth daily with  lunch.   . diltiazem (CARDIZEM CD) 180 MG 24 hr capsule Take 1 capsule (180 mg total) by mouth daily.  Marland Kitchen levothyroxine (SYNTHROID, LEVOTHROID) 75 MCG tablet Take 75 mcg by mouth daily before breakfast.  . metoprolol tartrate (LOPRESSOR) 25 MG tablet Take 1 tablet (25 mg total) by mouth 2 (two) times daily.  . Multiple Vitamin (MULTIVITAMIN WITH MINERALS) TABS tablet Take 1 tablet by mouth daily.  Marland Kitchen NITROSTAT 0.4 MG SL tablet Place 1 tablet (0.4 mg total) under the tongue every 5 (five) minutes as needed. Chest pain  . Polyethyl Glycol-Propyl Glycol (SYSTANE ULTRA) 0.4-0.3 % SOLN Place 1-2 drops into both eyes 3 (three) times daily as needed (dry eyes).  . psyllium (HYDROCIL/METAMUCIL) 95 % PACK  Take 1 packet by mouth 2 (two) times daily.  . raNITIdine HCl (ZANTAC PO) Take 1 tablet by mouth daily as needed (heartbrun).  . rosuvastatin (CRESTOR) 10 MG tablet Take 1 tablet (10 mg total) by mouth daily.     Allergies:   Anesthesia s-i-60   Social History   Tobacco Use  . Smoking status: Never Smoker  . Smokeless tobacco: Never Used  Substance Use Topics  . Alcohol use: Not Currently    Comment: occ.  . Drug use: No     Family Hx: The patient's family history includes Cancer in her sister; Heart attack (age of onset: 46) in her father; Stroke in her mother.  ROS:   Please see the history of present illness.    General:no colds or fevers, weight down 10 lbs since discharge Skin:no rashes or ulcers HEENT:no blurred vision, no congestion CV:see HPI PUL:see HPI GI:no diarrhea constipation or melena, no indigestion GU:no hematuria, no dysuria MS:no joint pain, no claudication Neuro:no syncope, no lightheadedness Endo:no diabetes, no thyroid disease  All other systems reviewed and are negative.   Prior CV studies:   The following studies were reviewed today:  Echo 04/21/18  IMPRESSIONS    1. The left ventricle has normal systolic function, with an ejection fraction of 55-60%.  The cavity size was normal. Mild basal septal hypertrophy. Left ventricular diastology could not be evaluated secondary to atrial fibrillation.  2. The right ventricle has normal systolic function. The cavity was normal. There is no increase in right ventricular wall thickness.  3. Left atrial size was moderately dilated.  4. The mitral valve is normal in structure.  5. The tricuspid valve is normal in structure. Tricuspid valve regurgitation is mild-moderate.  6. The aortic valve is tricuspid Mild sclerosis of the aortic valve.  7. The pulmonic valve was normal in structure. Pulmonic valve regurgitation is mild by color flow Doppler.  8. Right atrial pressure is estimated at 3 mmHg.  FINDINGS  Left Ventricle: The left ventricle has normal systolic function, with an ejection fraction of 55-60%. The cavity size was normal. Mild basal septal hypertrophy. Left ventricular diastology could not be evaluated secondary to atrial fibrillation. Right Ventricle: The right ventricle has normal systolic function. The cavity was normal. There is no increase in right ventricular wall thickness. Left Atrium: left atrial size was moderately dilated Right Atrium: right atrial size was normal in size Right atrial pressure is estimated at 3 mmHg. Interatrial Septum: No atrial level shunt detected by color flow Doppler. Pericardium: There is no evidence of pericardial effusion. Mitral Valve: The mitral valve is normal in structure. Mitral valve regurgitation is mild by color flow Doppler. Tricuspid Valve: The tricuspid valve is normal in structure. Tricuspid valve regurgitation is mild-moderate by color flow Doppler. Aortic Valve: The aortic valve is tricuspid Mild sclerosis of the aortic valve Aortic valve regurgitation was not visualized by color flow Doppler. Pulmonic Valve: The pulmonic valve was normal in structure. Pulmonic valve regurgitation is mild by color flow Doppler. Venous: The inferior vena cava is  normal in size with greater than 50% respiratory variability.   LEFT VENTRICLE PLAX 2D ([place])              Biplane EF (MOD) LV EF:          54.8 %       LV Biplane EF:   56.2 % LVIDd:          3.60 cm  LV A4C EF:       53.2 % LVIDs:          2.60 cm      LV A2C EF:       56.3 % LV PW:          0.80 cm LV IVS:         1.20 cm      Diastology LVOT diam:      1.90 cm      LV e' lateral:   11.90 cm/s LV SV:          30 ml        LV E/e' lateral: 8.2 LVOT Area:      2.84 cm     LV e' medial:    11.20 cm/s                              LV E/e' medial:  8.7 LV Volumes (MOD) LV area d, A2C:    20.10 cm LV area d, A4C:    23.60 cm LV area s, A2C:    12.30 cm LV area s, A4C:    14.70 cm LV major d, A2C:   6.39 cm LV major d, A4C:   6.89 cm LV major s, A2C:   6.05 cm LV major s, A4C:   5.86 cm LV vol d, MOD A2C: 52.6 ml LV vol d, MOD A4C: 67.1 ml LV vol s, MOD A2C: 23.0 ml LV vol s, MOD A4C: 31.4 ml LV SV MOD A2C:     29.6 ml LV SV MOD A4C:     67.1 ml LV SV MOD BP:      34.7 ml  RIGHT VENTRICLE RV S prime:     13.10 cm/s TAPSE (M-mode): 1.5 cm RVSP:           22.4 mmHg  LEFT ATRIUM             Index       RIGHT ATRIUM           Index LA diam:        3.80 cm 2.31 cm/m  RA Pressure: 3 mmHg LA Vol (A2C):   62.7 ml 38.17 ml/m RA Area:     14.50 cm LA Vol (A4C):   74.1 ml 45.11 ml/m RA Volume:   30.50 ml  18.57 ml/m LA Biplane Vol: 70.3 ml 42.79 ml/m  AORTIC VALVE LVOT Vmax:   79.10 cm/s LVOT Vmean:  59.200 cm/s LVOT VTI:    0.146 m   AORTA Ao Root diam: 2.70 cm Ao Asc diam:  3.20 cm  MITRAL VALVE               TRICUSPID VALVE MV Area (PHT): 1.97 cm    TR Peak grad:   19.4 mmHg MV PHT:        111.94 msec TR Vmax:        232.00 cm/s MV Decel Time: 386 msec    RVSP:           22.4 mmHg MR Peak grad: 97.2 mmHg MR Mean grad: 65.0 mmHg MR Vmax:      493.00 cm/s MR Vmean:     384.0 cm/s MR PISA:      0.25 MV E velocity: 97.33 cm/s   cardiac cath 09/27/16 1.  Single-vessel coronary artery disease with patency of the proximal LAD stent and mild nonobstructive stenosis in the  mid vessel 2. Widely patent left main, left circumflex, and RCA with no significant obstructive lesions. 3. Normal LV function with estimated LVEF 55-65%   Labs/Other Tests and Data Reviewed:    EKG:  An ECG dated 07/29/18 was personally reviewed today and demonstrated:  a fib with RVR at 111, nonspecific T wave abnromality  Recent Labs: 04/21/2018: TSH 2.863 08/05/2018: ALT 17 08/12/2018: BUN 16; Creatinine, Ser 0.45; Hemoglobin 9.5; Magnesium 1.7; Platelets 713; Potassium 3.0; Sodium 133   Recent Lipid Panel Lab Results  Component Value Date/Time   CHOL 135 02/21/2018 08:17 AM   TRIG 64 08/05/2018 03:46 AM   HDL 63 02/21/2018 08:17 AM   CHOLHDL 2.1 02/21/2018 08:17 AM   CHOLHDL 2.3 06/30/2015 08:48 AM   LDLCALC 62 02/21/2018 08:17 AM    Wt Readings from Last 3 Encounters:  08/22/18 131 lb (59.4 kg)  08/12/18 141 lb 12.1 oz (64.3 kg)  06/05/18 138 lb (62.6 kg)     Objective:    Vital Signs:  BP (!) 123/97   Pulse (!) 109   Ht 5' 2.5" (1.588 m)   Wt 131 lb (59.4 kg)   BMI 23.58 kg/m    VITAL SIGNS:  reviewed  General female in NAD Neuro alert and oreinted X 3 MAE follows commands Lungs can speak in complete sentences without SOB Legs, tr edema no pitting Psych pleasant affect   ASSESSMENT & PLAN:    1. Persistent a fib.  On anticoagulation for 3 weeks now she has not missed doses.  Will check with Dr Acie Fredrickson if she could resume amiodarone to see if this could convert her as before - she will see him in office in July and if still in a fib then plan DCCV 2. Volume overload, chronic diastolic HF, improved on no diuretics - will give lasix 20 po prn for wt gain of 3 lbs in a day or 5 lbs in a week.  She will have labs on follow up visit  3. small bowel resection s/p ileocecetomy-per surgery doing well walking daily. 4. CAD on statin.  5. DVT of upper ext  recent renal  Infarct due to being off anticoagulation.  6. 1.6 cm hyperattenuating wedge-shaped area in hepatic segment 4A, new from prior study. This is favored to represent a flash hemangioma or THAD. Follow-up with nonemergent outpatient ultrasound is recommended though follow up CTA of abd did not show this.   COVID-19 Education: The signs and symptoms of COVID-19 were discussed with the patient and how to seek care for testing (follow up with PCP or arrange E-visit).  The importance of social distancing was discussed today.  Time:   Today, I have spent 19 minutes with the patient with telehealth technology discussing the above problems.     Medication Adjustments/Labs and Tests Ordered: Current medicines are reviewed at length with the patient today.  Concerns regarding medicines are outlined above.   Tests Ordered: No orders of the defined types were placed in this encounter.   Medication Changes: No orders of the defined types were placed in this encounter.   Follow Up:  In Person in 2 week(s)  Signed, Cecilie Kicks, NP  08/22/2018 1:35 PM    [facility]

## 2018-08-22 NOTE — Patient Instructions (Addendum)
Medication Instructions:  Your physician has recommended you make the following change in your medication:  1.  START Lasix 20 mg ONLY TAKE 1 TABLET DAILY ONLY AS NEEDED FOR WEIGHT GAIN OF 3 LBS IN 1 DAY or 5 LBS IN A WEEK  If you need a refill on your cardiac medications before your next appointment, please call your pharmacy.   Lab work: None ordered  If you have labs (blood work) drawn today and your tests are completely normal, you will receive your results only by: Marland Kitchen MyChart Message (if you have MyChart) OR . A paper copy in the mail If you have any lab test that is abnormal or we need to change your treatment, we will call you to review the results.  Testing/Procedures: None ordered   Follow-Up: At [facility], you and your health needs are our priority.  As part of our continuing mission to provide you with exceptional heart care, we have created designated Provider Care Teams.  These Care Teams include your primary Cardiologist (physician) and Advanced Practice Providers (APPs -  Physician Assistants and Nurse Practitioners) who all work together to provide you with the care you need, when you need it. You will need a follow up appointment in: 09/10/2018 WITH DR. Acie Fredrickson AT 9:00 FOR AN IN-OFFICE VISIT.  ARRIVE 15 MINUTES EARLY FOR REGISTRATION.  Any Other Special Instructions Will Be Listed Below (If Applicable).

## 2018-08-23 DIAGNOSIS — I4819 Other persistent atrial fibrillation: Secondary | ICD-10-CM | POA: Diagnosis not present

## 2018-08-23 DIAGNOSIS — I10 Essential (primary) hypertension: Secondary | ICD-10-CM | POA: Diagnosis not present

## 2018-08-23 DIAGNOSIS — Z7901 Long term (current) use of anticoagulants: Secondary | ICD-10-CM | POA: Diagnosis not present

## 2018-08-23 DIAGNOSIS — I2510 Atherosclerotic heart disease of native coronary artery without angina pectoris: Secondary | ICD-10-CM | POA: Diagnosis not present

## 2018-08-23 DIAGNOSIS — K460 Unspecified abdominal hernia with obstruction, without gangrene: Secondary | ICD-10-CM | POA: Diagnosis not present

## 2018-08-23 DIAGNOSIS — J449 Chronic obstructive pulmonary disease, unspecified: Secondary | ICD-10-CM | POA: Diagnosis not present

## 2018-08-23 DIAGNOSIS — Z4801 Encounter for change or removal of surgical wound dressing: Secondary | ICD-10-CM | POA: Diagnosis not present

## 2018-08-23 DIAGNOSIS — E039 Hypothyroidism, unspecified: Secondary | ICD-10-CM | POA: Diagnosis not present

## 2018-08-23 DIAGNOSIS — E785 Hyperlipidemia, unspecified: Secondary | ICD-10-CM | POA: Diagnosis not present

## 2018-08-23 DIAGNOSIS — Z48815 Encounter for surgical aftercare following surgery on the digestive system: Secondary | ICD-10-CM | POA: Diagnosis not present

## 2018-08-27 DIAGNOSIS — I2510 Atherosclerotic heart disease of native coronary artery without angina pectoris: Secondary | ICD-10-CM | POA: Diagnosis not present

## 2018-08-27 DIAGNOSIS — J449 Chronic obstructive pulmonary disease, unspecified: Secondary | ICD-10-CM | POA: Diagnosis not present

## 2018-08-27 DIAGNOSIS — Z48815 Encounter for surgical aftercare following surgery on the digestive system: Secondary | ICD-10-CM | POA: Diagnosis not present

## 2018-08-27 DIAGNOSIS — K460 Unspecified abdominal hernia with obstruction, without gangrene: Secondary | ICD-10-CM | POA: Diagnosis not present

## 2018-08-27 DIAGNOSIS — E039 Hypothyroidism, unspecified: Secondary | ICD-10-CM | POA: Diagnosis not present

## 2018-08-27 DIAGNOSIS — I10 Essential (primary) hypertension: Secondary | ICD-10-CM | POA: Diagnosis not present

## 2018-08-27 DIAGNOSIS — Z7901 Long term (current) use of anticoagulants: Secondary | ICD-10-CM | POA: Diagnosis not present

## 2018-08-27 DIAGNOSIS — I4819 Other persistent atrial fibrillation: Secondary | ICD-10-CM | POA: Diagnosis not present

## 2018-08-27 DIAGNOSIS — Z4801 Encounter for change or removal of surgical wound dressing: Secondary | ICD-10-CM | POA: Diagnosis not present

## 2018-08-27 DIAGNOSIS — E785 Hyperlipidemia, unspecified: Secondary | ICD-10-CM | POA: Diagnosis not present

## 2018-08-29 DIAGNOSIS — I2510 Atherosclerotic heart disease of native coronary artery without angina pectoris: Secondary | ICD-10-CM | POA: Diagnosis not present

## 2018-08-29 DIAGNOSIS — K460 Unspecified abdominal hernia with obstruction, without gangrene: Secondary | ICD-10-CM | POA: Diagnosis not present

## 2018-08-29 DIAGNOSIS — E785 Hyperlipidemia, unspecified: Secondary | ICD-10-CM | POA: Diagnosis not present

## 2018-08-29 DIAGNOSIS — J449 Chronic obstructive pulmonary disease, unspecified: Secondary | ICD-10-CM | POA: Diagnosis not present

## 2018-08-29 DIAGNOSIS — I10 Essential (primary) hypertension: Secondary | ICD-10-CM | POA: Diagnosis not present

## 2018-08-29 DIAGNOSIS — Z7901 Long term (current) use of anticoagulants: Secondary | ICD-10-CM | POA: Diagnosis not present

## 2018-08-29 DIAGNOSIS — I4819 Other persistent atrial fibrillation: Secondary | ICD-10-CM | POA: Diagnosis not present

## 2018-08-29 DIAGNOSIS — Z4801 Encounter for change or removal of surgical wound dressing: Secondary | ICD-10-CM | POA: Diagnosis not present

## 2018-08-29 DIAGNOSIS — Z48815 Encounter for surgical aftercare following surgery on the digestive system: Secondary | ICD-10-CM | POA: Diagnosis not present

## 2018-08-29 DIAGNOSIS — E039 Hypothyroidism, unspecified: Secondary | ICD-10-CM | POA: Diagnosis not present

## 2018-09-03 ENCOUNTER — Telehealth: Payer: Self-pay | Admitting: Nurse Practitioner

## 2018-09-03 DIAGNOSIS — E785 Hyperlipidemia, unspecified: Secondary | ICD-10-CM | POA: Diagnosis not present

## 2018-09-03 DIAGNOSIS — I10 Essential (primary) hypertension: Secondary | ICD-10-CM | POA: Diagnosis not present

## 2018-09-03 DIAGNOSIS — Z7901 Long term (current) use of anticoagulants: Secondary | ICD-10-CM | POA: Diagnosis not present

## 2018-09-03 DIAGNOSIS — J449 Chronic obstructive pulmonary disease, unspecified: Secondary | ICD-10-CM | POA: Diagnosis not present

## 2018-09-03 DIAGNOSIS — I4819 Other persistent atrial fibrillation: Secondary | ICD-10-CM | POA: Diagnosis not present

## 2018-09-03 DIAGNOSIS — Z48815 Encounter for surgical aftercare following surgery on the digestive system: Secondary | ICD-10-CM | POA: Diagnosis not present

## 2018-09-03 DIAGNOSIS — E039 Hypothyroidism, unspecified: Secondary | ICD-10-CM | POA: Diagnosis not present

## 2018-09-03 DIAGNOSIS — K460 Unspecified abdominal hernia with obstruction, without gangrene: Secondary | ICD-10-CM | POA: Diagnosis not present

## 2018-09-03 DIAGNOSIS — Z4801 Encounter for change or removal of surgical wound dressing: Secondary | ICD-10-CM | POA: Diagnosis not present

## 2018-09-03 DIAGNOSIS — I2510 Atherosclerotic heart disease of native coronary artery without angina pectoris: Secondary | ICD-10-CM | POA: Diagnosis not present

## 2018-09-03 MED ORDER — AMIODARONE HCL 200 MG PO TABS: 200.0000 mg | ORAL_TABLET | Freq: Every day | ORAL | 3 refills | Status: DC

## 2018-09-03 NOTE — Telephone Encounter (Signed)
Spoke with patient regarding restarting amiodarone per message from Dr. Acie Fredrickson: Rachel Park has been discharged for 3 weeks.  She should have been discharged on Eliquis at the time of discharge and so could be started on Amiodarone at this time  Please verify that she has been on Eliquis since her DC and if so, please start Amiodarone 200 mg a day .  Will see her for a virtual visit ( or live visit in 2-3 weeks )   Patient states she has been consistent with her Eliquis. She agrees to start amiodarone 200 mg once daily. She states she has the medication at home. Her appointment with Dr. Acie Fredrickson was rescheduled due to her caregiver's schedule and Dr. Elmarie Shiley office schedule. I advised the patient to call sooner with questions or concerns. She verbalized understanding and agreement and thanked me for the call.

## 2018-09-05 DIAGNOSIS — I10 Essential (primary) hypertension: Secondary | ICD-10-CM | POA: Diagnosis not present

## 2018-09-05 DIAGNOSIS — J449 Chronic obstructive pulmonary disease, unspecified: Secondary | ICD-10-CM | POA: Diagnosis not present

## 2018-09-05 DIAGNOSIS — Z7901 Long term (current) use of anticoagulants: Secondary | ICD-10-CM | POA: Diagnosis not present

## 2018-09-05 DIAGNOSIS — E785 Hyperlipidemia, unspecified: Secondary | ICD-10-CM | POA: Diagnosis not present

## 2018-09-05 DIAGNOSIS — K460 Unspecified abdominal hernia with obstruction, without gangrene: Secondary | ICD-10-CM | POA: Diagnosis not present

## 2018-09-05 DIAGNOSIS — Z48815 Encounter for surgical aftercare following surgery on the digestive system: Secondary | ICD-10-CM | POA: Diagnosis not present

## 2018-09-05 DIAGNOSIS — E039 Hypothyroidism, unspecified: Secondary | ICD-10-CM | POA: Diagnosis not present

## 2018-09-05 DIAGNOSIS — I4819 Other persistent atrial fibrillation: Secondary | ICD-10-CM | POA: Diagnosis not present

## 2018-09-05 DIAGNOSIS — Z4801 Encounter for change or removal of surgical wound dressing: Secondary | ICD-10-CM | POA: Diagnosis not present

## 2018-09-05 DIAGNOSIS — I2510 Atherosclerotic heart disease of native coronary artery without angina pectoris: Secondary | ICD-10-CM | POA: Diagnosis not present

## 2018-09-10 ENCOUNTER — Ambulatory Visit: Payer: Medicare HMO | Admitting: Cardiovascular Disease

## 2018-09-10 DIAGNOSIS — Z4801 Encounter for change or removal of surgical wound dressing: Secondary | ICD-10-CM | POA: Diagnosis not present

## 2018-09-10 DIAGNOSIS — J449 Chronic obstructive pulmonary disease, unspecified: Secondary | ICD-10-CM | POA: Diagnosis not present

## 2018-09-10 DIAGNOSIS — Z7901 Long term (current) use of anticoagulants: Secondary | ICD-10-CM | POA: Diagnosis not present

## 2018-09-10 DIAGNOSIS — E039 Hypothyroidism, unspecified: Secondary | ICD-10-CM | POA: Diagnosis not present

## 2018-09-10 DIAGNOSIS — I10 Essential (primary) hypertension: Secondary | ICD-10-CM | POA: Diagnosis not present

## 2018-09-10 DIAGNOSIS — Z48815 Encounter for surgical aftercare following surgery on the digestive system: Secondary | ICD-10-CM | POA: Diagnosis not present

## 2018-09-10 DIAGNOSIS — K460 Unspecified abdominal hernia with obstruction, without gangrene: Secondary | ICD-10-CM | POA: Diagnosis not present

## 2018-09-10 DIAGNOSIS — I2510 Atherosclerotic heart disease of native coronary artery without angina pectoris: Secondary | ICD-10-CM | POA: Diagnosis not present

## 2018-09-10 DIAGNOSIS — I4819 Other persistent atrial fibrillation: Secondary | ICD-10-CM | POA: Diagnosis not present

## 2018-09-10 DIAGNOSIS — E785 Hyperlipidemia, unspecified: Secondary | ICD-10-CM | POA: Diagnosis not present

## 2018-09-12 DIAGNOSIS — I2510 Atherosclerotic heart disease of native coronary artery without angina pectoris: Secondary | ICD-10-CM | POA: Diagnosis not present

## 2018-09-12 DIAGNOSIS — I4819 Other persistent atrial fibrillation: Secondary | ICD-10-CM | POA: Diagnosis not present

## 2018-09-12 DIAGNOSIS — E039 Hypothyroidism, unspecified: Secondary | ICD-10-CM | POA: Diagnosis not present

## 2018-09-12 DIAGNOSIS — I10 Essential (primary) hypertension: Secondary | ICD-10-CM | POA: Diagnosis not present

## 2018-09-12 DIAGNOSIS — Z7901 Long term (current) use of anticoagulants: Secondary | ICD-10-CM | POA: Diagnosis not present

## 2018-09-12 DIAGNOSIS — K460 Unspecified abdominal hernia with obstruction, without gangrene: Secondary | ICD-10-CM | POA: Diagnosis not present

## 2018-09-12 DIAGNOSIS — E785 Hyperlipidemia, unspecified: Secondary | ICD-10-CM | POA: Diagnosis not present

## 2018-09-12 DIAGNOSIS — Z48815 Encounter for surgical aftercare following surgery on the digestive system: Secondary | ICD-10-CM | POA: Diagnosis not present

## 2018-09-12 DIAGNOSIS — Z4801 Encounter for change or removal of surgical wound dressing: Secondary | ICD-10-CM | POA: Diagnosis not present

## 2018-09-12 DIAGNOSIS — J449 Chronic obstructive pulmonary disease, unspecified: Secondary | ICD-10-CM | POA: Diagnosis not present

## 2018-09-18 DIAGNOSIS — Z4801 Encounter for change or removal of surgical wound dressing: Secondary | ICD-10-CM | POA: Diagnosis not present

## 2018-09-18 DIAGNOSIS — J449 Chronic obstructive pulmonary disease, unspecified: Secondary | ICD-10-CM | POA: Diagnosis not present

## 2018-09-18 DIAGNOSIS — I10 Essential (primary) hypertension: Secondary | ICD-10-CM | POA: Diagnosis not present

## 2018-09-18 DIAGNOSIS — Z7901 Long term (current) use of anticoagulants: Secondary | ICD-10-CM | POA: Diagnosis not present

## 2018-09-18 DIAGNOSIS — K460 Unspecified abdominal hernia with obstruction, without gangrene: Secondary | ICD-10-CM | POA: Diagnosis not present

## 2018-09-18 DIAGNOSIS — E039 Hypothyroidism, unspecified: Secondary | ICD-10-CM | POA: Diagnosis not present

## 2018-09-18 DIAGNOSIS — E785 Hyperlipidemia, unspecified: Secondary | ICD-10-CM | POA: Diagnosis not present

## 2018-09-18 DIAGNOSIS — I4819 Other persistent atrial fibrillation: Secondary | ICD-10-CM | POA: Diagnosis not present

## 2018-09-18 DIAGNOSIS — I2510 Atherosclerotic heart disease of native coronary artery without angina pectoris: Secondary | ICD-10-CM | POA: Diagnosis not present

## 2018-09-18 DIAGNOSIS — Z48815 Encounter for surgical aftercare following surgery on the digestive system: Secondary | ICD-10-CM | POA: Diagnosis not present

## 2018-09-19 DIAGNOSIS — Z7901 Long term (current) use of anticoagulants: Secondary | ICD-10-CM | POA: Diagnosis not present

## 2018-09-19 DIAGNOSIS — Z48815 Encounter for surgical aftercare following surgery on the digestive system: Secondary | ICD-10-CM | POA: Diagnosis not present

## 2018-09-19 DIAGNOSIS — E785 Hyperlipidemia, unspecified: Secondary | ICD-10-CM | POA: Diagnosis not present

## 2018-09-19 DIAGNOSIS — I2510 Atherosclerotic heart disease of native coronary artery without angina pectoris: Secondary | ICD-10-CM | POA: Diagnosis not present

## 2018-09-19 DIAGNOSIS — Z4801 Encounter for change or removal of surgical wound dressing: Secondary | ICD-10-CM | POA: Diagnosis not present

## 2018-09-19 DIAGNOSIS — E039 Hypothyroidism, unspecified: Secondary | ICD-10-CM | POA: Diagnosis not present

## 2018-09-19 DIAGNOSIS — I4819 Other persistent atrial fibrillation: Secondary | ICD-10-CM | POA: Diagnosis not present

## 2018-09-19 DIAGNOSIS — I10 Essential (primary) hypertension: Secondary | ICD-10-CM | POA: Diagnosis not present

## 2018-09-19 DIAGNOSIS — K460 Unspecified abdominal hernia with obstruction, without gangrene: Secondary | ICD-10-CM | POA: Diagnosis not present

## 2018-09-19 DIAGNOSIS — J449 Chronic obstructive pulmonary disease, unspecified: Secondary | ICD-10-CM | POA: Diagnosis not present

## 2018-09-23 ENCOUNTER — Telehealth: Payer: Self-pay | Admitting: Cardiovascular Disease

## 2018-09-23 NOTE — Telephone Encounter (Signed)
New Message ° ° ° °Left message to confirm appt and answer covid questions  °

## 2018-09-23 NOTE — Progress Notes (Signed)
Cardiology Office Note   Date:  09/24/2018   ID:  PREZLEY QADIR, DOB 1931-11-05, MRN [id number]  PCP:  Lajean Manes, MD  Cardiologist:   Mertie Moores, MD   No chief complaint on file.   problem list 1. Coronary artery  disease-status post stenting of her proximal LAD 2.  CVA- occurred during the stenting 3.  Atrial Fibrillation    Previous notes  Rachel Park is a 85 y.o. female who presents for follow up of her stenting She has done well.  Is a bit anxious - may account for her elevated BP today .  Memory has improved ,  No residual CVA symptoms.   Feb. 1, 2017: Doing well Has some fatigue. Has some bruising on lower logs  -  On Plavix   June 21, 2015:  Rachel Park was diagnosed with atrial fib, was started on eliquis 5 mg BID.  She was in [place] with her sister. Developed abdominal pain .   Severe hypoxemia .   In great distress. Was found to have a small bowell perforation .   Was in the ICU for 2 days.   Developed hypotension , was started on levophed,   Developed atrial fib  plavix was stopped,   Heparin was started.   Has converted to NSR at this time  Has had some band like chest pain since that time Has shortness of breath - at rest and with exertion .     September 22, 2015:  Rachel Park is doing well.  We DC'd her Eliquis - due to bleeding.   She is on Plavix  Her only episode of a-fib was in the setting of a small bowel obstruction   Is very fatigued.   Was put on Iron tablet by Dr. Felipa Eth. Hb is still low  Rachel Park also increased her Coreg to 25 BID .  BP readings at home have been well controlled.  Has not had any syncope Goes for a walk every day and is fatigued when she is done with the walk.   No CP .  Has occasional chest tightness .  Same symptoms back in April - [name] was normal in April, 2017   Feb. 16, 2018:  Rachel Park is seen today . Has had surgery to repair 4 hernias.   Cant get her energy  Its only been a month - I reminded  her that it takes time to heal after surgery .  No CP or dyspnea.   Sept. 24, 2018:  Rachel Park is seen today  Had some CP in July Repeat cath shows no obstructive CAD ,  LAD stent was widely patent  Has had some bursitis of left hip and knee.  No CP .   Thought to have reflux.   Tried Zantac, caused diarrhea, did not continue the zantac.   She has tried Crestor 10 mg in the past. But we had to decrease the dose to 5 g because of muscle aches. She thinks that she might be eating little bit more than she should.   May 23, 2017:  Doing well.  Has been working with the pharmacist at Dr. Sherryll Burger office  They are concerned about her HR in the 19s .  Has not had any syncope.   Has profound lack of energy - several hours after taking the coreg   Sept. 17, 2019: Rachel Park is seen for follow up of her CAD and PAF HR is typically low. Has not had any further  episodes of presyncope since we reduced the dose of Coreg. Still eats a bit more salt than she should   September 24, 2018  Rachel Park was admitted with ischemia bowel on Jul 23, 2018.   She had a prolonged ICU stay and developed AF with RVR.   She had a prolonged hospitalization with slow recovery.  She was started on Eliquis at the time of discharge.   She diuresed well   She was seen by Cecilie Kicks, NP a month ago .  Seemed to be doing well,  Was started back on amiodarone. Her torsemide has been changed to PRN  Breathing is better.  Eating well.   Abdomen is better.   Past Medical History:  Diagnosis Date  . Arthritis   . Complication of anesthesia   . Coronary artery disease   . Hypertension   . Hypothyroidism   . PAF (paroxysmal atrial fibrillation) ([place])    on Xarelto for maybe a year, opted to stop  . PONV (postoperative nausea and vomiting)   . Renal infarction ([place])   . Small bowel obstruction ([place]) 06/2015  . Stroke [facility])    oct 2016  . TIA (transient ischemic attack) 03/24/2015  . Vertigo, benign positional    to be  evaluated, intermittent    Past Surgical History:  Procedure Laterality Date  . ABDOMINAL HYSTERECTOMY    . APPLICATION OF WOUND VAC N/A 07/23/2018   Procedure: APPLICATION OF WOUND VAC;  Surgeon: Rolm Bookbinder, MD;  Location: [place];  Service: General;  Laterality: N/A;  . BOWEL RESECTION N/A 07/23/2018   Procedure: SMALL BOWEL RESECTION;  Surgeon: Rolm Bookbinder, MD;  Location: [place];  Service: General;  Laterality: N/A;  . CARDIAC CATHETERIZATION N/A 12/24/2014   Procedure: Left Heart Cath and Coronary Angiography;  Surgeon: Sherren Mocha, MD;  Location: [place] CV LAB;  Service: Cardiovascular;  Laterality: N/A;  . CARDIOVERSION N/A 05/22/2018   Procedure: CARDIOVERSION;  Surgeon: Josue Hector, MD;  Location: [facility] ENDOSCOPY;  Service: Cardiovascular;  Laterality: N/A;  . CATARACT EXTRACTION Bilateral   . COLONOSCOPY WITH PROPOFOL N/A 12/01/2013   Procedure: COLONOSCOPY WITH PROPOFOL;  Surgeon: Garlan Fair, MD;  Location: [facility] ENDOSCOPY;  Service: Endoscopy;  Laterality: N/A;  . COMPLEX WOUND CLOSURE  07/25/2018   Procedure: Complex Wound Closure;  Surgeon: Rolm Bookbinder, MD;  Location: [place];  Service: General;;  . Pennie Rushing  07/25/2018   Procedure: Ileocecetomy;  Surgeon: Rolm Bookbinder, MD;  Location: [place];  Service: General;;  . INCISIONAL HERNIA REPAIR N/A 03/10/2016   Procedure: INCISIONAL HERNIA REPAIR TIMES TWO;  Surgeon: Armandina Gemma, MD;  Location: [place];  Service: General;  Laterality: N/A;  . INGUINAL HERNIA REPAIR Bilateral 03/10/2016   Procedure: BILATERAL INGUINAL HERNIA REPAIRS;  Surgeon: Armandina Gemma, MD;  Location: [place];  Service: General;  Laterality: Bilateral;  . INSERTION OF MESH N/A 03/10/2016   Procedure: INSERTION OF MESH TO BILATERAL GROINS AND ABDOMEN;  Surgeon: Armandina Gemma, MD;  Location: [place];  Service: General;  Laterality: N/A;  . laparotomy  06/2015  . LAPAROTOMY N/A 07/23/2018   Procedure: EXPLORATORY LAPAROTOMY;  Surgeon: Rolm Bookbinder, MD;  Location: [place];  Service: General;  Laterality: N/A;  . LEFT HEART CATH AND CORONARY ANGIOGRAPHY N/A 09/27/2016   Procedure: Left Heart Cath and Coronary Angiography;  Surgeon: Sherren Mocha, MD;  Location: [place] CV LAB;  Service: Cardiovascular;  Laterality: N/A;  . TONSILLECTOMY       Current  Outpatient Medications  Medication Sig Dispense Refill  . acetaminophen (TYLENOL) 500 MG tablet Take 500 mg by mouth every 6 (six) hours as needed for mild pain.     Marland Kitchen AMINO ACIDS-PROTEIN HYDROLYS PO Take 30 mLs by mouth daily.    Marland Kitchen amiodarone (PACERONE) 200 MG tablet Take 1 tablet (200 mg total) by mouth daily. 90 tablet 3  . apixaban (ELIQUIS) 5 MG TABS tablet Take 1 tablet (5 mg total) by mouth 2 (two) times daily. 180 tablet 3  . CALCIUM PO Take 1 tablet by mouth daily.    . cholecalciferol (VITAMIN D) 1000 UNITS tablet Take 2,000 Units by mouth daily with lunch.     . diltiazem (CARDIZEM CD) 180 MG 24 hr capsule Take 1 capsule (180 mg total) by mouth daily. 30 capsule 2  . furosemide (LASIX) 20 MG tablet TAKE 1 TABLET ONLY AS NEEDED FOR WEIGHT GAIN OF 3 LBS IN ONE DAY 10 tablet 1  . levothyroxine (SYNTHROID, LEVOTHROID) 75 MCG tablet Take 75 mcg by mouth daily before breakfast.    . metoprolol tartrate (LOPRESSOR) 25 MG tablet Take 1 tablet (25 mg total) by mouth 2 (two) times daily. 180 tablet 2  . Multiple Vitamin (MULTIVITAMIN WITH MINERALS) TABS tablet Take 1 tablet by mouth daily.    Marland Kitchen NITROSTAT 0.4 MG SL tablet Place 1 tablet (0.4 mg total) under the tongue every 5 (five) minutes as needed. Chest pain 25 tablet 6  . Polyethyl Glycol-Propyl Glycol (SYSTANE ULTRA) 0.4-0.3 % SOLN Place 1-2 drops into both eyes 3 (three) times daily as needed (dry eyes).    . psyllium (HYDROCIL/METAMUCIL) 95 % PACK Take 1 packet by mouth daily.    . rosuvastatin (CRESTOR) 10 MG tablet Take 1 tablet (10 mg total) by mouth daily. 90 tablet 2   No current facility-administered medications for  this visit.     Allergies:   Anesthesia s-i-60    Social History:  The patient  reports that she has never smoked. She has never used smokeless tobacco. She reports previous alcohol use. She reports that she does not use drugs.   Family History:  The patient's family history includes Cancer in her sister; Heart attack (age of onset: 5) in her father; Stroke in her mother.    ROS:     Physical Exam: Blood pressure 134/62, pulse 75, height 5\' 2"  (1.575 m), weight 133 lb 6.4 oz (60.5 kg), SpO2 98 %.  GEN:  Well nourished, well developed in no acute distress HEENT: Normal NECK: No JVD; No carotid bruits LYMPHATICS: No lymphadenopathy CARDIAC:  Irreg. Irreg.  RESPIRATORY:  Clear to auscultation without rales, wheezing or rhonchi  ABDOMEN: Soft, non-tender, non-distended MUSCULOSKELETAL:  No edema; No deformity  SKIN: Warm and dry NEUROLOGIC:  Alert and oriented x 3    EKG:    Recent Labs: 04/21/2018: TSH 2.863 08/05/2018: ALT 17 08/12/2018: Magnesium 1.7 09/24/2018: BUN 15; Creatinine, Ser 0.67; Hemoglobin WILL FOLLOW; Platelets WILL FOLLOW; Potassium 5.6; Sodium 134    Lipid Panel    Component Value Date/Time   CHOL 135 02/21/2018 0817   TRIG 64 08/05/2018 0346   HDL 63 02/21/2018 0817   CHOLHDL 2.1 02/21/2018 0817   CHOLHDL 2.3 06/30/2015 0848   VLDL 14 06/30/2015 0848   LDLCALC 62 02/21/2018 0817      Wt Readings from Last 3 Encounters:  09/24/18 133 lb 6.4 oz (60.5 kg)  08/22/18 131 lb (59.4 kg)  08/12/18 141 lb 12.1 oz (64.3 kg)  Other studies Reviewed: Additional studies/ records that were reviewed today include: . Review of the above records demonstrates:    ASSESSMENT AND PLAN:  1.  CAD -    No angina   2. Persistent  atrial fib - CHADS2VASC schore of 4-6  ( female, age > 62, vascular disease,   TIA and a CVA ( as a complication to her cath)  She is been in persistent atrial fibrillation since she was admitted with a small bowel obstruction a  month or so ago.  She is been on Eliquis 5 mg p.o. twice daily and amiodarone.  Her weight is 60.5 kg she is 85 years old.  If she loses any weight we should change her to Eliquis 2.5 mg twice a day.  We will schedule her for cardioversion next week.    3. Hyperlipidemia:    Renew rosuvastatin.  4.  Sinus bradycardia.:    5.  Hypertension:: Pressure readings have been stable.  She is no longer having any episodes of presyncope.  Current medicines are reviewed at length with the patient today.  The patient does not have concerns regarding medicines.  The following changes have been made:  no change  Labs/ tests ordered today include:   Orders Placed This Encounter  Procedures  . CBC  . Basic Metabolic Panel (BMET)    Disposition:   FU with me in 3 months    Mertie Moores, MD  09/24/2018 5:36 PM    [facility] [place], [place], [place]  [address] Phone: [phone]; Fax: [phone]

## 2018-09-24 ENCOUNTER — Encounter: Payer: Self-pay | Admitting: Cardiovascular Disease

## 2018-09-24 ENCOUNTER — Ambulatory Visit (INDEPENDENT_AMBULATORY_CARE_PROVIDER_SITE_OTHER): Payer: Medicare HMO | Admitting: Cardiovascular Disease

## 2018-09-24 ENCOUNTER — Other Ambulatory Visit: Payer: Self-pay

## 2018-09-24 VITALS — BP 134/62 | HR 75 | Ht 62.0 in | Wt 133.4 lb

## 2018-09-24 DIAGNOSIS — I2510 Atherosclerotic heart disease of native coronary artery without angina pectoris: Secondary | ICD-10-CM | POA: Diagnosis not present

## 2018-09-24 DIAGNOSIS — I4819 Other persistent atrial fibrillation: Secondary | ICD-10-CM

## 2018-09-24 DIAGNOSIS — E782 Mixed hyperlipidemia: Secondary | ICD-10-CM

## 2018-09-24 LAB — CBC
Hematocrit: 35.2 % (ref 34.0–46.6)
Hemoglobin: 11.1 g/dL (ref 11.1–15.9)
MCH: 28.9 pg (ref 26.6–33.0)
MCHC: 31.5 g/dL (ref 31.5–35.7)
MCV: 92 fL (ref 79–97)
Platelets: 375 10*3/uL (ref 150–450)
RBC: 3.84 x10E6/uL (ref 3.77–5.28)
RDW: 14.2 % (ref 11.7–15.4)
WBC: 8.5 10*3/uL (ref 3.4–10.8)

## 2018-09-24 LAB — BASIC METABOLIC PANEL
BUN/Creatinine Ratio: 22 (ref 12–28)
BUN: 15 mg/dL (ref 8–27)
CO2: 23 mmol/L (ref 20–29)
Calcium: 9.5 mg/dL (ref 8.7–10.3)
Chloride: 96 mmol/L (ref 96–106)
Creatinine, Ser: 0.67 mg/dL (ref 0.57–1.00)
GFR calc Af Amer: 93 mL/min/{1.73_m2} (ref >59)
GFR calc non Af Amer: 81 mL/min/{1.73_m2} (ref >59)
Glucose: 94 mg/dL (ref 65–99)
Potassium: 5.6 mmol/L — ABNORMAL HIGH (ref 3.5–5.2)
Sodium: 134 mmol/L (ref 134–144)

## 2018-09-24 MED ORDER — APIXABAN 5 MG PO TABS: 5.0000 mg | ORAL_TABLET | Freq: Two times a day (BID) | ORAL | 3 refills | Status: DC

## 2018-09-24 NOTE — Patient Instructions (Addendum)
Medication Instructions:  Your physician recommends that you continue on your current medications as directed. Please refer to the Current Medication list given to you today.  **Please let us know if you lose any weight because we will need to change your dose of Eliquis  If you need a refill on your cardiac medications before your next appointment, please call your pharmacy.   Lab work: TODAY - CBC, BMET If you have labs (blood work) drawn today and your tests are completely normal, you will receive your results only by: Marland Kitchen MyChart Message (if you have MyChart) OR . A paper copy in the mail If you have any lab test that is abnormal or we need to change your treatment, we will call you to review the results.  Testing/Procedures: You are scheduled for a Cardioversion on Thursday July 30 with Dr. Acie Fredrickson.  Please arrive at the [facility] (Main Entrance A) at [facility]: [address] [place], [place] [address] at 1:00 pm.  DIET: Nothing to eat or drink after midnight except a sip of water with medications (see medication instructions below)  Medication Instructions: Hold Lasix (Furosemide)   Continue your anticoagulant: Eliquis You will need to continue your anticoagulant after your procedure until  you are told by your  Provider that it is safe to stop    You must have a responsible person to drive you home and stay in the waiting area during your procedure. Failure to do so could result in cancellation.  Bring your insurance cards.  *Special Note: Every effort is made to have your procedure done on time. Occasionally there are emergencies that occur at the hospital that may cause delays. Please be patient if a delay does occur.    Follow-Up: Your physician recommends that you have a virtual follow-up appointment on Thursday Sept. 17 at 9:20 am with Dr. Acie Fredrickson

## 2018-09-25 ENCOUNTER — Telehealth: Payer: Self-pay | Admitting: *Deleted

## 2018-09-25 DIAGNOSIS — I10 Essential (primary) hypertension: Secondary | ICD-10-CM

## 2018-09-25 DIAGNOSIS — I5032 Chronic diastolic (congestive) heart failure: Secondary | ICD-10-CM

## 2018-09-25 NOTE — Telephone Encounter (Signed)
-----   Message from Thayer Headings, MD sent at 09/25/2018 12:23 PM EDT ----- Potassium is higher.   She is not on supplement .  Only takes the lasix as needed. She may be eating more foods that are high in potassium.   Lets recheck in a month

## 2018-09-25 NOTE — Telephone Encounter (Signed)
Pt has been notified of lab results by phone with verbal understanding. We talked about K+ rich foods especially being summertime. She states she has indulged in some cantaloupe, has hard boiled egg every morning, adds 1/4 cup of raisins to her bran cereal every morning, said she had 1 piece of watermelon, also has 1 boost drink every morning. I advised pt she needs to limit these foods as they have quite a bit of K+ in them. I advised her ok to have her Boost drink as it has other minerals to help her diet as well. Pt is agreeable to plan of care. BMET to be done 10/29/18. Pt thanked me for the call and my help. Patient notified of result.  Please refer to phone note from today for complete details.   Julaine Hua, [place] 09/25/2018 4:40 PM

## 2018-09-26 DIAGNOSIS — I2510 Atherosclerotic heart disease of native coronary artery without angina pectoris: Secondary | ICD-10-CM | POA: Diagnosis not present

## 2018-09-26 DIAGNOSIS — I4819 Other persistent atrial fibrillation: Secondary | ICD-10-CM | POA: Diagnosis not present

## 2018-09-26 DIAGNOSIS — E785 Hyperlipidemia, unspecified: Secondary | ICD-10-CM | POA: Diagnosis not present

## 2018-09-26 DIAGNOSIS — J449 Chronic obstructive pulmonary disease, unspecified: Secondary | ICD-10-CM | POA: Diagnosis not present

## 2018-09-26 DIAGNOSIS — Z7901 Long term (current) use of anticoagulants: Secondary | ICD-10-CM | POA: Diagnosis not present

## 2018-09-26 DIAGNOSIS — K460 Unspecified abdominal hernia with obstruction, without gangrene: Secondary | ICD-10-CM | POA: Diagnosis not present

## 2018-09-26 DIAGNOSIS — Z48815 Encounter for surgical aftercare following surgery on the digestive system: Secondary | ICD-10-CM | POA: Diagnosis not present

## 2018-09-26 DIAGNOSIS — Z4801 Encounter for change or removal of surgical wound dressing: Secondary | ICD-10-CM | POA: Diagnosis not present

## 2018-09-26 DIAGNOSIS — E039 Hypothyroidism, unspecified: Secondary | ICD-10-CM | POA: Diagnosis not present

## 2018-09-26 DIAGNOSIS — I10 Essential (primary) hypertension: Secondary | ICD-10-CM | POA: Diagnosis not present

## 2018-09-30 DIAGNOSIS — I480 Paroxysmal atrial fibrillation: Secondary | ICD-10-CM | POA: Diagnosis not present

## 2018-09-30 DIAGNOSIS — Z0000 Encounter for general adult medical examination without abnormal findings: Secondary | ICD-10-CM | POA: Diagnosis not present

## 2018-09-30 DIAGNOSIS — R350 Frequency of micturition: Secondary | ICD-10-CM | POA: Diagnosis not present

## 2018-09-30 DIAGNOSIS — I10 Essential (primary) hypertension: Secondary | ICD-10-CM | POA: Diagnosis not present

## 2018-09-30 DIAGNOSIS — N8110 Cystocele, unspecified: Secondary | ICD-10-CM | POA: Diagnosis not present

## 2018-09-30 DIAGNOSIS — E7800 Pure hypercholesterolemia, unspecified: Secondary | ICD-10-CM | POA: Diagnosis not present

## 2018-09-30 DIAGNOSIS — Z1389 Encounter for screening for other disorder: Secondary | ICD-10-CM | POA: Diagnosis not present

## 2018-09-30 DIAGNOSIS — Z79899 Other long term (current) drug therapy: Secondary | ICD-10-CM | POA: Diagnosis not present

## 2018-09-30 DIAGNOSIS — D6869 Other thrombophilia: Secondary | ICD-10-CM | POA: Diagnosis not present

## 2018-09-30 DIAGNOSIS — E039 Hypothyroidism, unspecified: Secondary | ICD-10-CM | POA: Diagnosis not present

## 2018-10-01 ENCOUNTER — Telehealth: Payer: Self-pay | Admitting: Cardiovascular Disease

## 2018-10-01 DIAGNOSIS — I10 Essential (primary) hypertension: Secondary | ICD-10-CM | POA: Diagnosis not present

## 2018-10-01 DIAGNOSIS — E039 Hypothyroidism, unspecified: Secondary | ICD-10-CM | POA: Diagnosis not present

## 2018-10-01 DIAGNOSIS — Z48815 Encounter for surgical aftercare following surgery on the digestive system: Secondary | ICD-10-CM | POA: Diagnosis not present

## 2018-10-01 DIAGNOSIS — K460 Unspecified abdominal hernia with obstruction, without gangrene: Secondary | ICD-10-CM | POA: Diagnosis not present

## 2018-10-01 DIAGNOSIS — E785 Hyperlipidemia, unspecified: Secondary | ICD-10-CM | POA: Diagnosis not present

## 2018-10-01 DIAGNOSIS — Z7901 Long term (current) use of anticoagulants: Secondary | ICD-10-CM | POA: Diagnosis not present

## 2018-10-01 DIAGNOSIS — I2510 Atherosclerotic heart disease of native coronary artery without angina pectoris: Secondary | ICD-10-CM | POA: Diagnosis not present

## 2018-10-01 DIAGNOSIS — I4819 Other persistent atrial fibrillation: Secondary | ICD-10-CM | POA: Diagnosis not present

## 2018-10-01 DIAGNOSIS — Z4801 Encounter for change or removal of surgical wound dressing: Secondary | ICD-10-CM | POA: Diagnosis not present

## 2018-10-01 DIAGNOSIS — J449 Chronic obstructive pulmonary disease, unspecified: Secondary | ICD-10-CM | POA: Diagnosis not present

## 2018-10-01 NOTE — Telephone Encounter (Signed)
Spoke with patient who does have transportation to the [place] testing site.  Reviewed procedure with her.  Her neighbor is driving her and she will be there at 9 am.

## 2018-10-01 NOTE — Telephone Encounter (Signed)
Rachel Park, called stating patient is getting cardioversion done on 7/30, patient has not been testing for COVID yet.  She states if they don't get testing today, it will need to be cancelled. If cardioversion needs to be rescheduled it can be for Friday.

## 2018-10-01 NOTE — Telephone Encounter (Signed)
Called to speak with patient regarding COVID testing prior to her cardioversion scheduled for 7/30.  Pt states she had not been informed that she needed this prior to Thursday and thought it would be done Thursday AM if needed.  Advised pt she needs to have testing completed 1st thing in the morning and gave her the [address] address.  Pt is trying to contact her son to make sure he can drive her there tomorrow morning.  She is aware if this is not possible, her cardioversion will need to be rescheduled.  She will c/b ASAP this afternoon to make me aware as to whether she has transportation or not.   Please note, the patient not being made aware and not having COVID testing per protocol was not a results of the patient's doing.  It was an oversight of this office and will be ordered for rapid testing (Cepheid) as long as the pt can obtain transportation.

## 2018-10-02 ENCOUNTER — Other Ambulatory Visit (HOSPITAL_COMMUNITY)
Admission: RE | Admit: 2018-10-02 | Discharge: 2018-10-02 | Disposition: A | Discharge status: Home / Self Care | Payer: Medicare HMO | Source: Ambulatory Visit | Attending: Cardiovascular Disease | Admitting: Cardiovascular Disease

## 2018-10-02 ENCOUNTER — Other Ambulatory Visit: Payer: Self-pay | Admitting: Cardiovascular Disease

## 2018-10-02 DIAGNOSIS — Z20828 Contact with and (suspected) exposure to other viral communicable diseases: Secondary | ICD-10-CM | POA: Diagnosis not present

## 2018-10-02 LAB — SARS CORONAVIRUS 2 (TAT 6-24 HRS): SARS Coronavirus 2: NEGATIVE

## 2018-10-02 NOTE — Progress Notes (Signed)
Pt states she has quarantined since covid test and has not been out of the house or around anyone other than those living in her house. Pt states she has no symptoms of fever, short of breath, etc.

## 2018-10-03 ENCOUNTER — Ambulatory Visit (HOSPITAL_COMMUNITY)
Admission: RE | Admit: 2018-10-03 | Discharge: 2018-10-03 | Disposition: A | Discharge status: Home / Self Care | Payer: Medicare HMO | Attending: Cardiovascular Disease | Admitting: Cardiovascular Disease

## 2018-10-03 ENCOUNTER — Encounter (HOSPITAL_COMMUNITY)
Admission: RE | Disposition: A | Discharge status: Home / Self Care | Payer: Medicare HMO | Source: Home / Self Care | Attending: Cardiovascular Disease

## 2018-10-03 DIAGNOSIS — Z5309 Procedure and treatment not carried out because of other contraindication: Secondary | ICD-10-CM | POA: Insufficient documentation

## 2018-10-03 DIAGNOSIS — I4819 Other persistent atrial fibrillation: Secondary | ICD-10-CM | POA: Diagnosis not present

## 2018-10-03 SURGERY — CANCELLED PROCEDURE

## 2018-10-03 NOTE — Progress Notes (Signed)
Pt's cardioversion cancelled because pt self converted to normal sinus rhythm. Dr. Acie Fredrickson reviewed and aware.

## 2018-10-04 DIAGNOSIS — E785 Hyperlipidemia, unspecified: Secondary | ICD-10-CM | POA: Diagnosis not present

## 2018-10-04 DIAGNOSIS — K460 Unspecified abdominal hernia with obstruction, without gangrene: Secondary | ICD-10-CM | POA: Diagnosis not present

## 2018-10-04 DIAGNOSIS — E039 Hypothyroidism, unspecified: Secondary | ICD-10-CM | POA: Diagnosis not present

## 2018-10-04 DIAGNOSIS — Z48815 Encounter for surgical aftercare following surgery on the digestive system: Secondary | ICD-10-CM | POA: Diagnosis not present

## 2018-10-04 DIAGNOSIS — Z7901 Long term (current) use of anticoagulants: Secondary | ICD-10-CM | POA: Diagnosis not present

## 2018-10-04 DIAGNOSIS — Z4801 Encounter for change or removal of surgical wound dressing: Secondary | ICD-10-CM | POA: Diagnosis not present

## 2018-10-04 DIAGNOSIS — I10 Essential (primary) hypertension: Secondary | ICD-10-CM | POA: Diagnosis not present

## 2018-10-04 DIAGNOSIS — I4819 Other persistent atrial fibrillation: Secondary | ICD-10-CM | POA: Diagnosis not present

## 2018-10-04 DIAGNOSIS — I2510 Atherosclerotic heart disease of native coronary artery without angina pectoris: Secondary | ICD-10-CM | POA: Diagnosis not present

## 2018-10-04 DIAGNOSIS — J449 Chronic obstructive pulmonary disease, unspecified: Secondary | ICD-10-CM | POA: Diagnosis not present

## 2018-10-08 DIAGNOSIS — Z7901 Long term (current) use of anticoagulants: Secondary | ICD-10-CM | POA: Diagnosis not present

## 2018-10-08 DIAGNOSIS — I2510 Atherosclerotic heart disease of native coronary artery without angina pectoris: Secondary | ICD-10-CM | POA: Diagnosis not present

## 2018-10-08 DIAGNOSIS — I10 Essential (primary) hypertension: Secondary | ICD-10-CM | POA: Diagnosis not present

## 2018-10-08 DIAGNOSIS — E785 Hyperlipidemia, unspecified: Secondary | ICD-10-CM | POA: Diagnosis not present

## 2018-10-08 DIAGNOSIS — K460 Unspecified abdominal hernia with obstruction, without gangrene: Secondary | ICD-10-CM | POA: Diagnosis not present

## 2018-10-08 DIAGNOSIS — Z4801 Encounter for change or removal of surgical wound dressing: Secondary | ICD-10-CM | POA: Diagnosis not present

## 2018-10-08 DIAGNOSIS — E039 Hypothyroidism, unspecified: Secondary | ICD-10-CM | POA: Diagnosis not present

## 2018-10-08 DIAGNOSIS — I4819 Other persistent atrial fibrillation: Secondary | ICD-10-CM | POA: Diagnosis not present

## 2018-10-08 DIAGNOSIS — Z48815 Encounter for surgical aftercare following surgery on the digestive system: Secondary | ICD-10-CM | POA: Diagnosis not present

## 2018-10-08 DIAGNOSIS — J449 Chronic obstructive pulmonary disease, unspecified: Secondary | ICD-10-CM | POA: Diagnosis not present

## 2018-10-10 DIAGNOSIS — I2510 Atherosclerotic heart disease of native coronary artery without angina pectoris: Secondary | ICD-10-CM | POA: Diagnosis not present

## 2018-10-10 DIAGNOSIS — E785 Hyperlipidemia, unspecified: Secondary | ICD-10-CM | POA: Diagnosis not present

## 2018-10-10 DIAGNOSIS — Z7901 Long term (current) use of anticoagulants: Secondary | ICD-10-CM | POA: Diagnosis not present

## 2018-10-10 DIAGNOSIS — I10 Essential (primary) hypertension: Secondary | ICD-10-CM | POA: Diagnosis not present

## 2018-10-10 DIAGNOSIS — E039 Hypothyroidism, unspecified: Secondary | ICD-10-CM | POA: Diagnosis not present

## 2018-10-10 DIAGNOSIS — I4819 Other persistent atrial fibrillation: Secondary | ICD-10-CM | POA: Diagnosis not present

## 2018-10-10 DIAGNOSIS — Z48815 Encounter for surgical aftercare following surgery on the digestive system: Secondary | ICD-10-CM | POA: Diagnosis not present

## 2018-10-10 DIAGNOSIS — Z4801 Encounter for change or removal of surgical wound dressing: Secondary | ICD-10-CM | POA: Diagnosis not present

## 2018-10-10 DIAGNOSIS — K460 Unspecified abdominal hernia with obstruction, without gangrene: Secondary | ICD-10-CM | POA: Diagnosis not present

## 2018-10-10 DIAGNOSIS — J449 Chronic obstructive pulmonary disease, unspecified: Secondary | ICD-10-CM | POA: Diagnosis not present

## 2018-10-12 DIAGNOSIS — K460 Unspecified abdominal hernia with obstruction, without gangrene: Secondary | ICD-10-CM | POA: Diagnosis not present

## 2018-10-12 DIAGNOSIS — Z7901 Long term (current) use of anticoagulants: Secondary | ICD-10-CM | POA: Diagnosis not present

## 2018-10-12 DIAGNOSIS — I4819 Other persistent atrial fibrillation: Secondary | ICD-10-CM | POA: Diagnosis not present

## 2018-10-12 DIAGNOSIS — J449 Chronic obstructive pulmonary disease, unspecified: Secondary | ICD-10-CM | POA: Diagnosis not present

## 2018-10-12 DIAGNOSIS — E785 Hyperlipidemia, unspecified: Secondary | ICD-10-CM | POA: Diagnosis not present

## 2018-10-12 DIAGNOSIS — I10 Essential (primary) hypertension: Secondary | ICD-10-CM | POA: Diagnosis not present

## 2018-10-12 DIAGNOSIS — E039 Hypothyroidism, unspecified: Secondary | ICD-10-CM | POA: Diagnosis not present

## 2018-10-12 DIAGNOSIS — Z48815 Encounter for surgical aftercare following surgery on the digestive system: Secondary | ICD-10-CM | POA: Diagnosis not present

## 2018-10-12 DIAGNOSIS — I2510 Atherosclerotic heart disease of native coronary artery without angina pectoris: Secondary | ICD-10-CM | POA: Diagnosis not present

## 2018-10-12 DIAGNOSIS — Z4801 Encounter for change or removal of surgical wound dressing: Secondary | ICD-10-CM | POA: Diagnosis not present

## 2018-10-15 DIAGNOSIS — I4819 Other persistent atrial fibrillation: Secondary | ICD-10-CM | POA: Diagnosis not present

## 2018-10-15 DIAGNOSIS — E039 Hypothyroidism, unspecified: Secondary | ICD-10-CM | POA: Diagnosis not present

## 2018-10-15 DIAGNOSIS — E785 Hyperlipidemia, unspecified: Secondary | ICD-10-CM | POA: Diagnosis not present

## 2018-10-15 DIAGNOSIS — Z48815 Encounter for surgical aftercare following surgery on the digestive system: Secondary | ICD-10-CM | POA: Diagnosis not present

## 2018-10-15 DIAGNOSIS — Z7901 Long term (current) use of anticoagulants: Secondary | ICD-10-CM | POA: Diagnosis not present

## 2018-10-15 DIAGNOSIS — Z4801 Encounter for change or removal of surgical wound dressing: Secondary | ICD-10-CM | POA: Diagnosis not present

## 2018-10-15 DIAGNOSIS — J449 Chronic obstructive pulmonary disease, unspecified: Secondary | ICD-10-CM | POA: Diagnosis not present

## 2018-10-15 DIAGNOSIS — K460 Unspecified abdominal hernia with obstruction, without gangrene: Secondary | ICD-10-CM | POA: Diagnosis not present

## 2018-10-15 DIAGNOSIS — I2510 Atherosclerotic heart disease of native coronary artery without angina pectoris: Secondary | ICD-10-CM | POA: Diagnosis not present

## 2018-10-15 DIAGNOSIS — I10 Essential (primary) hypertension: Secondary | ICD-10-CM | POA: Diagnosis not present

## 2018-10-17 DIAGNOSIS — K460 Unspecified abdominal hernia with obstruction, without gangrene: Secondary | ICD-10-CM | POA: Diagnosis not present

## 2018-10-17 DIAGNOSIS — Z4801 Encounter for change or removal of surgical wound dressing: Secondary | ICD-10-CM | POA: Diagnosis not present

## 2018-10-17 DIAGNOSIS — I2510 Atherosclerotic heart disease of native coronary artery without angina pectoris: Secondary | ICD-10-CM | POA: Diagnosis not present

## 2018-10-17 DIAGNOSIS — I4819 Other persistent atrial fibrillation: Secondary | ICD-10-CM | POA: Diagnosis not present

## 2018-10-17 DIAGNOSIS — Z7901 Long term (current) use of anticoagulants: Secondary | ICD-10-CM | POA: Diagnosis not present

## 2018-10-17 DIAGNOSIS — E785 Hyperlipidemia, unspecified: Secondary | ICD-10-CM | POA: Diagnosis not present

## 2018-10-17 DIAGNOSIS — Z48815 Encounter for surgical aftercare following surgery on the digestive system: Secondary | ICD-10-CM | POA: Diagnosis not present

## 2018-10-17 DIAGNOSIS — J449 Chronic obstructive pulmonary disease, unspecified: Secondary | ICD-10-CM | POA: Diagnosis not present

## 2018-10-17 DIAGNOSIS — E039 Hypothyroidism, unspecified: Secondary | ICD-10-CM | POA: Diagnosis not present

## 2018-10-17 DIAGNOSIS — I10 Essential (primary) hypertension: Secondary | ICD-10-CM | POA: Diagnosis not present

## 2018-10-22 ENCOUNTER — Telehealth: Payer: Self-pay | Admitting: Cardiovascular Disease

## 2018-10-22 DIAGNOSIS — J449 Chronic obstructive pulmonary disease, unspecified: Secondary | ICD-10-CM | POA: Diagnosis not present

## 2018-10-22 DIAGNOSIS — Z7901 Long term (current) use of anticoagulants: Secondary | ICD-10-CM | POA: Diagnosis not present

## 2018-10-22 DIAGNOSIS — E039 Hypothyroidism, unspecified: Secondary | ICD-10-CM | POA: Diagnosis not present

## 2018-10-22 DIAGNOSIS — K460 Unspecified abdominal hernia with obstruction, without gangrene: Secondary | ICD-10-CM | POA: Diagnosis not present

## 2018-10-22 DIAGNOSIS — I4819 Other persistent atrial fibrillation: Secondary | ICD-10-CM | POA: Diagnosis not present

## 2018-10-22 DIAGNOSIS — Z48815 Encounter for surgical aftercare following surgery on the digestive system: Secondary | ICD-10-CM | POA: Diagnosis not present

## 2018-10-22 DIAGNOSIS — I10 Essential (primary) hypertension: Secondary | ICD-10-CM | POA: Diagnosis not present

## 2018-10-22 DIAGNOSIS — E785 Hyperlipidemia, unspecified: Secondary | ICD-10-CM | POA: Diagnosis not present

## 2018-10-22 DIAGNOSIS — Z4801 Encounter for change or removal of surgical wound dressing: Secondary | ICD-10-CM | POA: Diagnosis not present

## 2018-10-22 DIAGNOSIS — I2510 Atherosclerotic heart disease of native coronary artery without angina pectoris: Secondary | ICD-10-CM | POA: Diagnosis not present

## 2018-10-22 NOTE — Telephone Encounter (Addendum)
Spoke with Otila Kluver, Home health nurse.  She states pt's HR at visit today was 125-135, irregular and thready.  Pt denies any SOB, palps, dizziness, CP or other sx.  Took all of her medications this morning.  BP 130/80, O2 96%.  Otila Kluver feels like pt is likely back in Afib.  Advised I will speak with DOD or Dr. Acie Fredrickson and call back with recommendations. Spoke with Dr. Marlou Porch, DOD and he said to have pt start taking Amiodarone 200 BID and see Dr. Acie Fredrickson or [facility].  Spoke with pt and went over recommendations.  Pt agreeable to plan and would like to see Dr. Acie Fredrickson tomorrow. Pt has no sx and feels fine.  Pt aware of sx develop or HR gets much higher, go to ER.  Will route to Dr. Acie Fredrickson for review.

## 2018-10-22 NOTE — Telephone Encounter (Signed)
New Message   STAT if HR is under 50 or over 120 (normal HR is 60-100 beats per minute)  1) What is your heart rate? 125-135  2) Do you have a log of your heart rate readings (document readings)?   3) Do you have any other symptoms? No symptoms.

## 2018-10-22 NOTE — Telephone Encounter (Signed)
I will see her tomorrow.  Agree with increasing the amio to 200 mg BID for now.

## 2018-10-23 ENCOUNTER — Other Ambulatory Visit: Payer: Self-pay

## 2018-10-23 ENCOUNTER — Ambulatory Visit (INDEPENDENT_AMBULATORY_CARE_PROVIDER_SITE_OTHER): Payer: Medicare HMO | Admitting: Cardiovascular Disease

## 2018-10-23 ENCOUNTER — Encounter: Payer: Self-pay | Admitting: Cardiovascular Disease

## 2018-10-23 VITALS — BP 120/82 | HR 72 | Ht 62.0 in | Wt 135.8 lb

## 2018-10-23 DIAGNOSIS — I4819 Other persistent atrial fibrillation: Secondary | ICD-10-CM

## 2018-10-23 DIAGNOSIS — I483 Typical atrial flutter: Secondary | ICD-10-CM | POA: Diagnosis not present

## 2018-10-23 DIAGNOSIS — I25119 Atherosclerotic heart disease of native coronary artery with unspecified angina pectoris: Secondary | ICD-10-CM

## 2018-10-23 DIAGNOSIS — I4892 Unspecified atrial flutter: Secondary | ICD-10-CM

## 2018-10-23 MED ORDER — AMIODARONE HCL 200 MG PO TABS: 200.0000 mg | ORAL_TABLET | Freq: Two times a day (BID) | ORAL | 3 refills | Status: DC

## 2018-10-23 NOTE — Progress Notes (Signed)
Cardiology Office Note   Date:  10/23/2018   ID:  Rachel Park, DOB June 28, 1931, MRN [id number]  PCP:  Lajean Manes, MD  Cardiologist:   Mertie Moores, MD   Chief Complaint  Patient presents with  . Atrial Fibrillation    problem list 1. Coronary artery  disease-status post stenting of her proximal LAD 2.  CVA- occurred during the stenting 3.  Atrial Fibrillation    Previous notes  Rachel Park is a 85 y.o. female who presents for follow up of her stenting She has done well.  Is a bit anxious - may account for her elevated BP today .  Memory has improved ,  No residual CVA symptoms.   Feb. 1, 2017: Doing well Has some fatigue. Has some bruising on lower logs  -  On Plavix   June 21, 2015:  Rachel Park was diagnosed with atrial fib, was started on eliquis 5 mg BID.  She was in [place] with her sister. Developed abdominal pain .   Severe hypoxemia .   In great distress. Was found to have a small bowell perforation .   Was in the ICU for 2 days.   Developed hypotension , was started on levophed,   Developed atrial fib  plavix was stopped,   Heparin was started.   Has converted to NSR at this time  Has had some band like chest pain since that time Has shortness of breath - at rest and with exertion .     September 22, 2015:  Rachel Park is doing well.  We DC'd her Eliquis - due to bleeding.   She is on Plavix  Her only episode of a-fib was in the setting of a small bowel obstruction   Is very fatigued.   Was put on Iron tablet by Dr. Felipa Eth. Hb is still low  Stoneking also increased her Coreg to 25 BID .  BP readings at home have been well controlled.  Has not had any syncope Goes for a walk every day and is fatigued when she is done with the walk.   No CP .  Has occasional chest tightness .  Same symptoms back in April - [name] was normal in April, 2017   Feb. 16, 2018:  Rachel Park is seen today . Has had surgery to repair 4 hernias.   Cant get her energy   Its only been a month - I reminded her that it takes time to heal after surgery .  No CP or dyspnea.   Sept. 24, 2018:  Rachel Park is seen today  Had some CP in July Repeat cath shows no obstructive CAD ,  LAD stent was widely patent  Has had some bursitis of left hip and knee.  No CP .   Thought to have reflux.   Tried Zantac, caused diarrhea, did not continue the zantac.   She has tried Crestor 10 mg in the past. But we had to decrease the dose to 5 g because of muscle aches. She thinks that she might be eating little bit more than she should.   May 23, 2017:  Doing well.  Has been working with the pharmacist at Dr. Sherryll Burger office  They are concerned about her HR in the 65s .  Has not had any syncope.   Has profound lack of energy - several hours after taking the coreg   Sept. 17, 2019: Rachel Park is seen for follow up of her CAD and PAF HR is typically  low. Has not had any further episodes of presyncope since we reduced the dose of Coreg. Still eats a bit more salt than she should   September 24, 2018  Rachel Park was admitted with ischemia bowel on Jul 23, 2018.   She had a prolonged ICU stay and developed AF with RVR.   She had a prolonged hospitalization with slow recovery.  She was started on Eliquis at the time of discharge.   She diuresed well   She was seen by Cecilie Kicks, NP a month ago .  Seemed to be doing well,  Was started back on amiodarone. Her torsemide has been changed to PRN  Breathing is better.  Eating well.   Abdomen is better.   Aug. 19, 2020   Rachel Park is seen today . She was seen a month ago and was set up for a cardioversion.  She had been started on amiodarone and was in NSR when she showed up for her cardioverison She reported some tachycardia and generalized weakness  recently. She was thought to have developed recurrent AF and her amiodarone was increased to 200 mg BID .    Past Medical History:  Diagnosis Date  . Arthritis   . Complication of  anesthesia   . Coronary artery disease   . Hypertension   . Hypothyroidism   . PAF (paroxysmal atrial fibrillation) ([place])    on Xarelto for maybe a year, opted to stop  . PONV (postoperative nausea and vomiting)   . Renal infarction ([place])   . Small bowel obstruction ([place]) 06/2015  . Stroke [facility])    oct 2016  . TIA (transient ischemic attack) 03/24/2015  . Vertigo, benign positional    to be evaluated, intermittent    Past Surgical History:  Procedure Laterality Date  . ABDOMINAL HYSTERECTOMY    . APPLICATION OF WOUND VAC N/A 07/23/2018   Procedure: APPLICATION OF WOUND VAC;  Surgeon: Rolm Bookbinder, MD;  Location: [place];  Service: General;  Laterality: N/A;  . BOWEL RESECTION N/A 07/23/2018   Procedure: SMALL BOWEL RESECTION;  Surgeon: Rolm Bookbinder, MD;  Location: [place];  Service: General;  Laterality: N/A;  . CARDIAC CATHETERIZATION N/A 12/24/2014   Procedure: Left Heart Cath and Coronary Angiography;  Surgeon: Sherren Mocha, MD;  Location: [place] [facility];  Service: Cardiovascular;  Laterality: N/A;  . CARDIOVERSION N/A 05/22/2018   Procedure: CARDIOVERSION;  Surgeon: Josue Hector, MD;  Location: [facility];  Service: Cardiovascular;  Laterality: N/A;  . CATARACT EXTRACTION Bilateral   . COLONOSCOPY WITH PROPOFOL N/A 12/01/2013   Procedure: COLONOSCOPY WITH PROPOFOL;  Surgeon: Garlan Fair, MD;  Location: [facility];  Service: Endoscopy;  Laterality: N/A;  . COMPLEX WOUND CLOSURE  07/25/2018   Procedure: Complex Wound Closure;  Surgeon: Rolm Bookbinder, MD;  Location: [place];  Service: General;;  . Pennie Rushing  07/25/2018   Procedure: Ileocecetomy;  Surgeon: Rolm Bookbinder, MD;  Location: [place];  Service: General;;  . INCISIONAL HERNIA REPAIR N/A 03/10/2016   Procedure: INCISIONAL HERNIA REPAIR TIMES TWO;  Surgeon: Armandina Gemma, MD;  Location: [place];  Service: General;  Laterality: N/A;  . INGUINAL HERNIA REPAIR Bilateral 03/10/2016   Procedure: BILATERAL  INGUINAL HERNIA REPAIRS;  Surgeon: Armandina Gemma, MD;  Location: [place];  Service: General;  Laterality: Bilateral;  . INSERTION OF MESH N/A 03/10/2016   Procedure: INSERTION OF MESH TO BILATERAL GROINS AND ABDOMEN;  Surgeon: Armandina Gemma, MD;  Location: [place];  Service: General;  Laterality:  N/A;  . laparotomy  06/2015  . LAPAROTOMY N/A 07/23/2018   Procedure: EXPLORATORY LAPAROTOMY;  Surgeon: Rolm Bookbinder, MD;  Location: [place];  Service: General;  Laterality: N/A;  . LEFT HEART CATH AND CORONARY ANGIOGRAPHY N/A 09/27/2016   Procedure: Left Heart Cath and Coronary Angiography;  Surgeon: Sherren Mocha, MD;  Location: [place] [facility];  Service: Cardiovascular;  Laterality: N/A;  . TONSILLECTOMY       Current Outpatient Medications  Medication Sig Dispense Refill  . acetaminophen (TYLENOL) 500 MG tablet Take 1,000 mg by mouth every 6 (six) hours as needed for mild pain.     Marland Kitchen apixaban (ELIQUIS) 5 MG TABS tablet Take 1 tablet (5 mg total) by mouth 2 (two) times daily. 180 tablet 3  . Calcium Carb-Cholecalciferol (CALCIUM + D3 PO) Take 1 tablet by mouth daily at 12 noon.    . Cholecalciferol (VITAMIN D3) 50 MCG (2000 UT) TABS Take 2,000 Units by mouth daily with lunch.    . diltiazem (CARDIZEM CD) 180 MG 24 hr capsule Take 1 capsule (180 mg total) by mouth daily. 30 capsule 2  . feeding supplement (BOOST HIGH PROTEIN) LIQD Take 1 Container by mouth daily at 3 pm.    . furosemide (LASIX) 20 MG tablet TAKE 1 TABLET ONLY AS NEEDED FOR WEIGHT GAIN OF 3 LBS IN ONE DAY 10 tablet 1  . levothyroxine (SYNTHROID, LEVOTHROID) 75 MCG tablet Take 75 mcg by mouth daily before breakfast.    . metoprolol tartrate (LOPRESSOR) 25 MG tablet Take 1 tablet (25 mg total) by mouth 2 (two) times daily. 180 tablet 2  . mirabegron ER (MYRBETRIQ) 25 MG TB24 tablet Take 25 mg by mouth daily.    . Multiple Vitamin (MULTIVITAMIN WITH MINERALS) TABS tablet Take 1 tablet by mouth daily.    Marland Kitchen NITROSTAT 0.4 MG SL tablet Place  1 tablet (0.4 mg total) under the tongue every 5 (five) minutes as needed. Chest pain 25 tablet 6  . Polyethyl Glycol-Propyl Glycol (SYSTANE ULTRA) 0.4-0.3 % SOLN Place 1-2 drops into both eyes 3 (three) times daily as needed (dry eyes).    . psyllium (HYDROCIL/METAMUCIL) 95 % PACK Take 1 packet by mouth daily.    . rosuvastatin (CRESTOR) 10 MG tablet Take 1 tablet (10 mg total) by mouth daily. 90 tablet 2  . amiodarone (PACERONE) 200 MG tablet Take 1 tablet (200 mg total) by mouth 2 (two) times daily. 180 tablet 3   No current facility-administered medications for this visit.     Allergies:   Anesthesia s-i-60    Social History:  The patient  reports that she has never smoked. She has never used smokeless tobacco. She reports previous alcohol use. She reports that she does not use drugs.   Family History:  The patient's family history includes Cancer in her sister; Heart attack (age of onset: 73) in her father; Stroke in her mother.    ROS:      Physical Exam: Blood pressure 120/82, pulse 72, height 5\' 2"  (1.575 m), weight 135 lb 12.8 oz (61.6 kg), SpO2 95 %.  GEN:  Well nourished, well developed in no acute distress HEENT: Normal NECK: No JVD; No carotid bruits LYMPHATICS: No lymphadenopathy CARDIAC:  Irreg. Irreg. , no murmurs, rubs, gallops RESPIRATORY:  Clear to auscultation without rales, wheezing or rhonchi  ABDOMEN: Soft, non-tender, non-distended MUSCULOSKELETAL:  No edema; No deformity  SKIN: Warm and dry NEUROLOGIC:  Alert and oriented x 3     EKG:  Atrial flutter with variable block.  Heart rate is 72.  Recent Labs: 04/21/2018: TSH 2.863 08/05/2018: ALT 17 08/12/2018: Magnesium 1.7 09/24/2018: BUN 15; Creatinine, Ser 0.67; Hemoglobin 11.1; Platelets 375; Potassium 5.6; Sodium 134    Lipid Panel    Component Value Date/Time   CHOL 135 02/21/2018 0817   TRIG 64 08/05/2018 0346   HDL 63 02/21/2018 0817   CHOLHDL 2.1 02/21/2018 0817   CHOLHDL 2.3 06/30/2015  0848   VLDL 14 06/30/2015 0848   LDLCALC 62 02/21/2018 0817      Wt Readings from Last 3 Encounters:  10/23/18 135 lb 12.8 oz (61.6 kg)  09/24/18 133 lb 6.4 oz (60.5 kg)  08/22/18 131 lb (59.4 kg)      Other studies Reviewed: Additional studies/ records that were reviewed today include: . Review of the above records demonstrates:    ASSESSMENT AND PLAN:  1.  CAD -    No angina   2. Persistent  atrial fib - CHADS2VASC schore of 4-6  ( female, age > 35, vascular disease,   TIA and a CVA ( as a complication to her cath)   .  Has gong back into atrial flutter.  Still on eliquis Will double Amiodarone to 200 mg BID for 3 weeks.  Set up follow up visit with me or APP in 3 weeks. With ECG  Will anticipate decreasing the amio at that time.   Consider cardioversion if she is still in NSR    3. Hyperlipidemia:    Cont meds.   4.  Sinus bradycardia.:    5.  Hypertension:: BP is well controlled.   Current medicines are reviewed at length with the patient today.  The patient does not have concerns regarding medicines.  The following changes have been made:  no change  Labs/ tests ordered today include:   Orders Placed This Encounter  Procedures  . Basic metabolic panel  . EKG 12-Lead    Disposition:   FU with me or APP in 3 weeks.    Mertie Moores, MD  10/23/2018 5:52 PM    [facility] [address], [place], [place]  [address] Phone: [phone]; Fax: [phone]

## 2018-10-23 NOTE — Patient Instructions (Addendum)
Medication Instructions:  Your physician has recommended you make the following change in your medication:   INCREASE: amiodarone to 200 mg twice a day   If you need a refill on your cardiac medications before your next appointment, please call your pharmacy.   Lab work: TODAY: BMET  If you have labs (blood work) drawn today and your tests are completely normal, you will receive your results only by: Marland Kitchen MyChart Message (if you have MyChart) OR . A paper copy in the mail If you have any lab test that is abnormal or we need to change your treatment, we will call you to review the results.  Testing/Procedures: None ordered  Follow-Up: . Follow up with Dr. Acie Fredrickson in the office with an EKG on 11/14/18 at 11:40 AM  Any Other Special Instructions Will Be Listed Below (If Applicable).

## 2018-10-24 DIAGNOSIS — I4819 Other persistent atrial fibrillation: Secondary | ICD-10-CM | POA: Diagnosis not present

## 2018-10-24 DIAGNOSIS — I2510 Atherosclerotic heart disease of native coronary artery without angina pectoris: Secondary | ICD-10-CM | POA: Diagnosis not present

## 2018-10-24 DIAGNOSIS — K460 Unspecified abdominal hernia with obstruction, without gangrene: Secondary | ICD-10-CM | POA: Diagnosis not present

## 2018-10-24 DIAGNOSIS — J449 Chronic obstructive pulmonary disease, unspecified: Secondary | ICD-10-CM | POA: Diagnosis not present

## 2018-10-24 DIAGNOSIS — E039 Hypothyroidism, unspecified: Secondary | ICD-10-CM | POA: Diagnosis not present

## 2018-10-24 DIAGNOSIS — Z7901 Long term (current) use of anticoagulants: Secondary | ICD-10-CM | POA: Diagnosis not present

## 2018-10-24 DIAGNOSIS — I10 Essential (primary) hypertension: Secondary | ICD-10-CM | POA: Diagnosis not present

## 2018-10-24 DIAGNOSIS — Z48815 Encounter for surgical aftercare following surgery on the digestive system: Secondary | ICD-10-CM | POA: Diagnosis not present

## 2018-10-24 DIAGNOSIS — Z4801 Encounter for change or removal of surgical wound dressing: Secondary | ICD-10-CM | POA: Diagnosis not present

## 2018-10-24 DIAGNOSIS — E785 Hyperlipidemia, unspecified: Secondary | ICD-10-CM | POA: Diagnosis not present

## 2018-10-24 LAB — BASIC METABOLIC PANEL
BUN/Creatinine Ratio: 28 (ref 12–28)
BUN: 16 mg/dL (ref 8–27)
CO2: 22 mmol/L (ref 20–29)
Calcium: 9.2 mg/dL (ref 8.7–10.3)
Chloride: 102 mmol/L (ref 96–106)
Creatinine, Ser: 0.58 mg/dL (ref 0.57–1.00)
GFR calc Af Amer: 98 mL/min/{1.73_m2} (ref >59)
GFR calc non Af Amer: 85 mL/min/{1.73_m2} (ref >59)
Glucose: 86 mg/dL (ref 65–99)
Potassium: 4.5 mmol/L (ref 3.5–5.2)
Sodium: 137 mmol/L (ref 134–144)

## 2018-10-26 ENCOUNTER — Other Ambulatory Visit: Payer: Self-pay | Admitting: Cardiovascular Disease

## 2018-10-28 ENCOUNTER — Other Ambulatory Visit: Payer: Medicare HMO

## 2018-10-29 DIAGNOSIS — K460 Unspecified abdominal hernia with obstruction, without gangrene: Secondary | ICD-10-CM | POA: Diagnosis not present

## 2018-10-29 DIAGNOSIS — I4819 Other persistent atrial fibrillation: Secondary | ICD-10-CM | POA: Diagnosis not present

## 2018-10-29 DIAGNOSIS — Z7901 Long term (current) use of anticoagulants: Secondary | ICD-10-CM | POA: Diagnosis not present

## 2018-10-29 DIAGNOSIS — J449 Chronic obstructive pulmonary disease, unspecified: Secondary | ICD-10-CM | POA: Diagnosis not present

## 2018-10-29 DIAGNOSIS — I2510 Atherosclerotic heart disease of native coronary artery without angina pectoris: Secondary | ICD-10-CM | POA: Diagnosis not present

## 2018-10-29 DIAGNOSIS — Z4801 Encounter for change or removal of surgical wound dressing: Secondary | ICD-10-CM | POA: Diagnosis not present

## 2018-10-29 DIAGNOSIS — E039 Hypothyroidism, unspecified: Secondary | ICD-10-CM | POA: Diagnosis not present

## 2018-10-29 DIAGNOSIS — Z48815 Encounter for surgical aftercare following surgery on the digestive system: Secondary | ICD-10-CM | POA: Diagnosis not present

## 2018-10-29 DIAGNOSIS — I10 Essential (primary) hypertension: Secondary | ICD-10-CM | POA: Diagnosis not present

## 2018-10-29 DIAGNOSIS — E785 Hyperlipidemia, unspecified: Secondary | ICD-10-CM | POA: Diagnosis not present

## 2018-11-01 DIAGNOSIS — Z48815 Encounter for surgical aftercare following surgery on the digestive system: Secondary | ICD-10-CM | POA: Diagnosis not present

## 2018-11-01 DIAGNOSIS — Z7901 Long term (current) use of anticoagulants: Secondary | ICD-10-CM | POA: Diagnosis not present

## 2018-11-01 DIAGNOSIS — E785 Hyperlipidemia, unspecified: Secondary | ICD-10-CM | POA: Diagnosis not present

## 2018-11-01 DIAGNOSIS — E039 Hypothyroidism, unspecified: Secondary | ICD-10-CM | POA: Diagnosis not present

## 2018-11-01 DIAGNOSIS — I4819 Other persistent atrial fibrillation: Secondary | ICD-10-CM | POA: Diagnosis not present

## 2018-11-01 DIAGNOSIS — Z4801 Encounter for change or removal of surgical wound dressing: Secondary | ICD-10-CM | POA: Diagnosis not present

## 2018-11-01 DIAGNOSIS — I2510 Atherosclerotic heart disease of native coronary artery without angina pectoris: Secondary | ICD-10-CM | POA: Diagnosis not present

## 2018-11-01 DIAGNOSIS — I10 Essential (primary) hypertension: Secondary | ICD-10-CM | POA: Diagnosis not present

## 2018-11-01 DIAGNOSIS — J449 Chronic obstructive pulmonary disease, unspecified: Secondary | ICD-10-CM | POA: Diagnosis not present

## 2018-11-01 DIAGNOSIS — K460 Unspecified abdominal hernia with obstruction, without gangrene: Secondary | ICD-10-CM | POA: Diagnosis not present

## 2018-11-07 DIAGNOSIS — R69 Illness, unspecified: Secondary | ICD-10-CM | POA: Diagnosis not present

## 2018-11-08 DIAGNOSIS — Z4801 Encounter for change or removal of surgical wound dressing: Secondary | ICD-10-CM | POA: Diagnosis not present

## 2018-11-08 DIAGNOSIS — J449 Chronic obstructive pulmonary disease, unspecified: Secondary | ICD-10-CM | POA: Diagnosis not present

## 2018-11-08 DIAGNOSIS — Z7901 Long term (current) use of anticoagulants: Secondary | ICD-10-CM | POA: Diagnosis not present

## 2018-11-08 DIAGNOSIS — I2510 Atherosclerotic heart disease of native coronary artery without angina pectoris: Secondary | ICD-10-CM | POA: Diagnosis not present

## 2018-11-08 DIAGNOSIS — Z48815 Encounter for surgical aftercare following surgery on the digestive system: Secondary | ICD-10-CM | POA: Diagnosis not present

## 2018-11-08 DIAGNOSIS — K460 Unspecified abdominal hernia with obstruction, without gangrene: Secondary | ICD-10-CM | POA: Diagnosis not present

## 2018-11-08 DIAGNOSIS — E785 Hyperlipidemia, unspecified: Secondary | ICD-10-CM | POA: Diagnosis not present

## 2018-11-08 DIAGNOSIS — E039 Hypothyroidism, unspecified: Secondary | ICD-10-CM | POA: Diagnosis not present

## 2018-11-08 DIAGNOSIS — I10 Essential (primary) hypertension: Secondary | ICD-10-CM | POA: Diagnosis not present

## 2018-11-08 DIAGNOSIS — I4819 Other persistent atrial fibrillation: Secondary | ICD-10-CM | POA: Diagnosis not present

## 2018-11-13 NOTE — Progress Notes (Signed)
Cardiology Office Note   Date:  11/14/2018   ID:  Rachel Park, DOB 14-Mar-1931, MRN [id number]  PCP:  Lajean Manes, MD  Cardiologist:   Mertie Moores, MD   Chief Complaint  Patient presents with  . Atrial Flutter    problem list 1. Coronary artery  disease-status post stenting of her proximal LAD 2.  CVA- occurred during the stenting 3.  Atrial Fibrillation    Previous notes  Rachel Park is a 85 y.o. female who presents for follow up of her stenting She has done well.  Is a bit anxious - may account for her elevated BP today .  Memory has improved ,  No residual CVA symptoms.   Feb. 1, 2017: Doing well Has some fatigue. Has some bruising on lower logs  -  On Plavix   June 21, 2015:  Rachel Park was diagnosed with atrial fib, was started on eliquis 5 mg BID.  She was in [place] with her sister. Developed abdominal pain .   Severe hypoxemia .   In great distress. Was found to have a small bowell perforation .   Was in the ICU for 2 days.   Developed hypotension , was started on levophed,   Developed atrial fib  plavix was stopped,   Heparin was started.   Has converted to NSR at this time  Has had some band like chest pain since that time Has shortness of breath - at rest and with exertion .     September 22, 2015:  Rachel Park is doing well.  We DC'd her Eliquis - due to bleeding.   She is on Plavix  Her only episode of a-fib was in the setting of a small bowel obstruction   Is very fatigued.   Was put on Iron tablet by Dr. Felipa Eth. Hb is still low  Stone[place] also increased her Coreg to 25 BID .  BP readings at home have been well controlled.  Has not had any syncope Goes for a walk every day and is fatigued when she is done with the walk.   No CP .  Has occasional chest tightness .  Same symptoms back in April - Rachel Park was normal in April, 2017   Feb. 16, 2018:  Rachel Park is seen today . Has had surgery to repair 4 hernias.   Cant get her energy  Its  only been a month - I reminded her that it takes time to heal after surgery .  No CP or dyspnea.   Sept. 24, 2018:  Rachel Park is seen today  Had some CP in July Repeat cath shows no obstructive CAD ,  LAD stent was widely patent  Has had some bursitis of left hip and knee.  No CP .   Thought to have reflux.   Tried Zantac, caused diarrhea, did not continue the zantac.   She has tried Crestor 10 mg in the past. But we had to decrease the dose to 5 g because of muscle aches. She thinks that she might be eating little bit more than she should.   May 23, 2017:  Doing well.  Has been working with the pharmacist at Dr. Sherryll Burger office  They are concerned about her HR in the 40s .  Has not had any syncope.   Has profound lack of energy - several hours after taking the coreg   Sept. 17, 2019: Rachel Park is seen for follow up of her CAD and PAF HR is typically  low. Has not had any further episodes of presyncope since we reduced the dose of Coreg. Still eats a bit more salt than she should   September 24, 2018  Rachel Park was admitted with ischemia bowel on Jul 23, 2018.   She had a prolonged ICU stay and developed AF with RVR.   She had a prolonged hospitalization with slow recovery.  She was started on Eliquis at the time of discharge.   She diuresed well   She was seen by Cecilie Kicks, NP a month ago .  Seemed to be doing well,  Was started back on amiodarone. Her torsemide has been changed to PRN  Breathing is better.  Eating well.   Abdomen is better.   Aug. 19, 2020   Rachel Park is seen today . She was seen a month ago and was set up for a cardioversion.  She had been started on amiodarone and was in NSR when she showed up for her cardioverison She reported some tachycardia and generalized weakness  recently. She was thought to have developed recurrent AF and her amiodarone was increased to 200 mg BID .   Sept. 10, 2020  Rachel Park is seen for follow up of her atrial flutter. I saw her several  weeks ago and found her to be in atrial flutter.   We doubled her amiodarone and had her return today for follow up   Past Medical History:  Diagnosis Date  . Arthritis   . Complication of anesthesia   . Coronary artery disease   . Hypertension   . Hypothyroidism   . PAF (paroxysmal atrial fibrillation) ([place])    on Xarelto for maybe a year, opted to stop  . PONV (postoperative nausea and vomiting)   . Renal infarction ([place])   . Small bowel obstruction ([place]) 06/2015  . Stroke [facility])    oct 2016  . TIA (transient ischemic attack) 03/24/2015  . Vertigo, benign positional    to be evaluated, intermittent    Past Surgical History:  Procedure Laterality Date  . ABDOMINAL HYSTERECTOMY    . APPLICATION OF WOUND VAC N/A 07/23/2018   Procedure: APPLICATION OF WOUND VAC;  Surgeon: Rolm Bookbinder, MD;  Location: [place];  Service: General;  Laterality: N/A;  . BOWEL RESECTION N/A 07/23/2018   Procedure: SMALL BOWEL RESECTION;  Surgeon: Rolm Bookbinder, MD;  Location: [place];  Service: General;  Laterality: N/A;  . CARDIAC CATHETERIZATION N/A 12/24/2014   Procedure: Left Heart Cath and Coronary Angiography;  Surgeon: Sherren Mocha, MD;  Location: [place] [facility];  Service: Cardiovascular;  Laterality: N/A;  . CARDIOVERSION N/A 05/22/2018   Procedure: CARDIOVERSION;  Surgeon: Josue Hector, MD;  Location: [facility] ENDOSCOPY;  Service: Cardiovascular;  Laterality: N/A;  . CATARACT EXTRACTION Bilateral   . COLONOSCOPY WITH PROPOFOL N/A 12/01/2013   Procedure: COLONOSCOPY WITH PROPOFOL;  Surgeon: Garlan Fair, MD;  [facility];  Service: Endoscopy;  Laterality: N/A;  . COMPLEX WOUND CLOSURE  07/25/2018   Procedure: Complex Wound Closure;  Surgeon: Rolm Bookbinder, MD;  Location: [place];  Service: General;;  . Pennie Rushing  07/25/2018   Procedure: Ileocecetomy;  Surgeon: Rolm Bookbinder, MD;  Location: [place];  Service: General;;  . INCISIONAL HERNIA REPAIR N/A 03/10/2016    Procedure: INCISIONAL HERNIA REPAIR TIMES TWO;  Surgeon: Armandina Gemma, MD;  Location: [place];  Service: General;  Laterality: N/A;  . INGUINAL HERNIA REPAIR Bilateral 03/10/2016   Procedure: BILATERAL INGUINAL HERNIA REPAIRS;  Surgeon: Armandina Gemma,  MD;  Location: [place];  Service: General;  Laterality: Bilateral;  . INSERTION OF MESH N/A 03/10/2016   Procedure: INSERTION OF MESH TO BILATERAL GROINS AND ABDOMEN;  Surgeon: Armandina Gemma, MD;  Location: [place];  Service: General;  Laterality: N/A;  . laparotomy  06/2015  . LAPAROTOMY N/A 07/23/2018   Procedure: EXPLORATORY LAPAROTOMY;  Surgeon: Rolm Bookbinder, MD;  Location: [place];  Service: General;  Laterality: N/A;  . LEFT HEART CATH AND CORONARY ANGIOGRAPHY N/A 09/27/2016   Procedure: Left Heart Cath and Coronary Angiography;  Surgeon: Sherren Mocha, MD;  Location: [place] [facility];  Service: Cardiovascular;  Laterality: N/A;  . TONSILLECTOMY       Current Outpatient Medications  Medication Sig Dispense Refill  . acetaminophen (TYLENOL) 500 MG tablet Take 1,000 mg by mouth every 6 (six) hours as needed for mild pain.     Marland Kitchen amiodarone (PACERONE) 200 MG tablet Take 1 tablet (200 mg total) by mouth 2 (two) times daily. 180 tablet 3  . apixaban (ELIQUIS) 5 MG TABS tablet Take 1 tablet (5 mg total) by mouth 2 (two) times daily. 180 tablet 3  . Calcium Carb-Cholecalciferol (CALCIUM + D3 PO) Take 1 tablet by mouth daily at 12 noon.    . Cholecalciferol (VITAMIN D3) 50 MCG (2000 UT) TABS Take 2,000 Units by mouth daily with lunch.    . diltiazem (CARDIZEM CD) 180 MG 24 hr capsule Take 1 capsule by mouth once daily 90 capsule 3  . feeding supplement (BOOST HIGH PROTEIN) LIQD Take 1 Container by mouth daily at 3 pm.    . furosemide (LASIX) 20 MG tablet TAKE 1 TABLET ONLY AS NEEDED FOR WEIGHT GAIN OF 3 LBS IN ONE DAY 10 tablet 1  . levothyroxine (SYNTHROID, LEVOTHROID) 75 MCG tablet Take 75 mcg by mouth daily before breakfast.    . metoprolol tartrate  (LOPRESSOR) 25 MG tablet Take 1 tablet (25 mg total) by mouth 2 (two) times daily. 180 tablet 2  . mirabegron ER (MYRBETRIQ) 25 MG TB24 tablet Take 25 mg by mouth daily.    . Multiple Vitamin (MULTIVITAMIN WITH MINERALS) TABS tablet Take 1 tablet by mouth daily.    Marland Kitchen NITROSTAT 0.4 MG SL tablet Place 1 tablet (0.4 mg total) under the tongue every 5 (five) minutes as needed. Chest pain 25 tablet 6  . Polyethyl Glycol-Propyl Glycol (SYSTANE ULTRA) 0.4-0.3 % SOLN Place 1-2 drops into both eyes 3 (three) times daily as needed (dry eyes).    . psyllium (HYDROCIL/METAMUCIL) 95 % PACK Take 1 packet by mouth daily.    . rosuvastatin (CRESTOR) 10 MG tablet Take 1 tablet (10 mg total) by mouth daily. 90 tablet 2   No current facility-administered medications for this visit.     Allergies:   Anesthesia s-i-60    Social History:  The patient  reports that she has never smoked. She has never used smokeless tobacco. She reports previous alcohol use. She reports that she does not use drugs.   Family History:  The patient's family history includes Cancer in her sister; Heart attack (age of onset: 45) in her father; Stroke in her mother.    ROS:     Physical Exam: Blood pressure 128/84, pulse 80, height 5\' 2"  (1.575 m), weight 134 lb 12.8 oz (61.1 kg), SpO2 97 %.  GEN:  Well nourished, well developed in no acute distress HEENT: Normal NECK: No JVD; No carotid bruits LYMPHATICS: No lymphadenopathy CARDIAC: irreg. Irreg.  RESPIRATORY:  Clear  to auscultation without rales, wheezing or rhonchi  ABDOMEN: Soft, non-tender, non-distended MUSCULOSKELETAL:  No edema; No deformity  SKIN: Warm and dry NEUROLOGIC:  Alert and oriented x 3    EKG:     Atrial Flutter with HR of 80,  Variable AV block     Recent Labs: 04/21/2018: TSH 2.863 08/05/2018: ALT 17 08/12/2018: Magnesium 1.7 09/24/2018: Hemoglobin 11.1; Platelets 375 10/23/2018: BUN 16; Creatinine, Ser 0.58; Potassium 4.5; Sodium 137    Lipid Panel     Component Value Date/Time   CHOL 135 02/21/2018 0817   TRIG 64 08/05/2018 0346   HDL 63 02/21/2018 0817   CHOLHDL 2.1 02/21/2018 0817   CHOLHDL 2.3 06/30/2015 0848   VLDL 14 06/30/2015 0848   LDLCALC 62 02/21/2018 0817      Wt Readings from Last 3 Encounters:  11/14/18 134 lb 12.8 oz (61.1 kg)  10/23/18 135 lb 12.8 oz (61.6 kg)  09/24/18 133 lb 6.4 oz (60.5 kg)      Other studies Reviewed: Additional studies/ records that were reviewed today include: . Review of the above records demonstrates:    ASSESSMENT AND PLAN:  1.  CAD -   No angina .   Will check lipids in the next several months   2. Persistent  atrial fib - CHADS2VASC schore of 4-6  ( female, age > 65, vascular disease,   TIA and a CVA ( as a complication to her cath)   .  Has gong back into atrial flutter.  Still on eliquis We  doubled Amiodarone to 200 mg BID for 3 weeks but she is still in [place].  Will schedule her for another cardioversion Will also refer to EP to see if she is a candidate for ablation     3. Hyperlipidemia:     Continue meds.   Check labs before her next office visit   4.  Sinus bradycardia.:    5.  Hypertension::  BP is well controlled.   Current medicines are reviewed at length with the patient today.  The patient does not have concerns regarding medicines.  The following changes have been made:  no change  Labs/ tests ordered today include:   Orders Placed This Encounter  Procedures  . Hepatic function panel  . Lipid Profile  . Basic Metabolic Panel (BMET)  . CBC  . EKG 12-Lead    Disposition:   FU with me in 2 months   Mertie Moores, MD  11/14/2018 5:58 PM    [facility] [address], [place], [place]  [address] Phone: [phone]; Fax: [phone]

## 2018-11-14 ENCOUNTER — Other Ambulatory Visit: Payer: Self-pay

## 2018-11-14 ENCOUNTER — Ambulatory Visit (INDEPENDENT_AMBULATORY_CARE_PROVIDER_SITE_OTHER): Payer: Medicare HMO | Admitting: Cardiovascular Disease

## 2018-11-14 ENCOUNTER — Encounter: Payer: Self-pay | Admitting: Cardiovascular Disease

## 2018-11-14 VITALS — BP 128/84 | HR 80 | Ht 62.0 in | Wt 134.8 lb

## 2018-11-14 DIAGNOSIS — I2510 Atherosclerotic heart disease of native coronary artery without angina pectoris: Secondary | ICD-10-CM | POA: Diagnosis not present

## 2018-11-14 DIAGNOSIS — I483 Typical atrial flutter: Secondary | ICD-10-CM | POA: Diagnosis not present

## 2018-11-14 DIAGNOSIS — I4819 Other persistent atrial fibrillation: Secondary | ICD-10-CM

## 2018-11-14 DIAGNOSIS — I25119 Atherosclerotic heart disease of native coronary artery with unspecified angina pectoris: Secondary | ICD-10-CM | POA: Diagnosis not present

## 2018-11-14 DIAGNOSIS — E782 Mixed hyperlipidemia: Secondary | ICD-10-CM

## 2018-11-14 NOTE — Patient Instructions (Addendum)
Medication Instructions:  Your physician recommends that you continue on your current medications as directed. Please refer to the Current Medication list given to you today.  If you need a refill on your cardiac medications before your next appointment, please call your pharmacy.   Lab work: Your physician recommends that you return for FASTING lab work  11/25/18 BMET, Liver, CBC and Lipids  Your Pre-procedure COVID-19 Testing will be done on Sept. 21 at 10:40 am at [facility] at [id number], [place], [place] [address]. Once you arrive at the testing site, stay in the right hand lane, go under the building overhang not the tent. If you are tested under the tent your results may not be back before your procedure. Please be on time for your appointment.  After your swab you will be given a mask to wear and instructed to go home and quarantine/no visitors until after your procedure. If you test positive you will be notified and your procedure will be cancelled.    Testing/Procedures: You are scheduled for a TEE/Cardioversion/TEE Cardioversion on Thursday 11/28/18  with Dr. Acie Fredrickson.  Please arrive at the [facility] (Main Entrance A) at [facility]: [address] [place], [place] [address] at 12:00 pm. (1 hour prior to procedure unless lab work is needed; if lab work is needed arrive 1.5 hours ahead)  DIET: Nothing to eat or drink after midnight except a sip of water with medications (see medication instructions below)  Medication Instructions: Hold Furosemide (Lasix)  Morning of procedure  Continue your anticoagulant: Eliquis You will need to continue your anticoagulant after your procedure until you are told by your  Provider that it is safe to stop  You must have a responsible person to drive you home and stay in the waiting area during your procedure. Failure to do so could result in cancellation.  Bring your insurance cards.  *Special Note: Every  effort is made to have your procedure done on time. Occasionally there are emergencies that occur at the hospital that may cause delays. Please be patient if a delay does occur.   Follow-Up: At [facility], you and your health needs are our priority.  As part of our continuing mission to provide you with exceptional heart care, we have created designated Provider Care Teams.  These Care Teams include your primary Cardiologist (physician) and Advanced Practice Providers (APPs -  Physician Assistants and Nurse Practitioners) who all work together to provide you with the care you need, when you need it. . You will need a follow up appointment in:  2 months on 01/15/19 for a virtual appt. With Dr. Acie Fredrickson  Any Other Special Instructions Will Be Listed Below (If Applicable).

## 2018-11-15 ENCOUNTER — Telehealth: Payer: Self-pay | Admitting: Cardiovascular Disease

## 2018-11-15 DIAGNOSIS — I4892 Unspecified atrial flutter: Secondary | ICD-10-CM

## 2018-11-15 DIAGNOSIS — I4819 Other persistent atrial fibrillation: Secondary | ICD-10-CM

## 2018-11-15 MED ORDER — AMIODARONE HCL 200 MG PO TABS: 200.0000 mg | ORAL_TABLET | Freq: Every day | ORAL | 3 refills | Status: DC

## 2018-11-15 NOTE — Telephone Encounter (Signed)
Spoke with Rachel Park in Surgical scheduling to cancel patient's DCCV. Event monitor order placed and amiodarone dose reduced to 200 mg daily. Spoke with patient and reviewed plan of care. She is aware someone will call her to give her instructions regarding the monitor. She verbalized understanding and agreement and agrees to call back with questions or concerns. She thanked me for the call.

## 2018-11-15 NOTE — Addendum Note (Signed)
Addended by: Emmaline Life on: 11/15/2018 02:30 PM   Modules accepted: Orders

## 2018-11-15 NOTE — Telephone Encounter (Signed)
Discussed the case with her son, Curt Jews, MD She may still be having PAF / flutter She has shown up twice for cardioversion and was found to have converted back to NSR   She has been loaded on amiodarone 200 bid For the past 3 weeks  Lets reduce the amiodarone to 200 mg a day - 30 patch event monitor to look for NSR in addition to the atrial flutter  Cancel the cardioversion for now while we assess her   We also discussed her getting a Kardia monitor that might be useful in periodic monitoring of her rhythm.  Will see her in 3 months in the office.     Mertie Moores, MD  11/15/2018 1:31 PM    [facility] [place],  [place], [place]  [address] Pager [phone] Phone: [phone]; Fax: [phone]

## 2018-11-21 ENCOUNTER — Telehealth: Payer: Medicare HMO | Admitting: Cardiovascular Disease

## 2018-11-25 ENCOUNTER — Other Ambulatory Visit: Payer: Self-pay

## 2018-11-25 ENCOUNTER — Other Ambulatory Visit: Payer: Medicare HMO | Admitting: *Deleted

## 2018-11-25 ENCOUNTER — Other Ambulatory Visit (HOSPITAL_COMMUNITY): Payer: Medicare HMO

## 2018-11-25 DIAGNOSIS — I483 Typical atrial flutter: Secondary | ICD-10-CM

## 2018-11-25 DIAGNOSIS — I2510 Atherosclerotic heart disease of native coronary artery without angina pectoris: Secondary | ICD-10-CM

## 2018-11-25 DIAGNOSIS — I5032 Chronic diastolic (congestive) heart failure: Secondary | ICD-10-CM

## 2018-11-25 DIAGNOSIS — I10 Essential (primary) hypertension: Secondary | ICD-10-CM

## 2018-11-25 DIAGNOSIS — E782 Mixed hyperlipidemia: Secondary | ICD-10-CM | POA: Diagnosis not present

## 2018-11-25 LAB — HEPATIC FUNCTION PANEL
ALT: 21 IU/L (ref 0–32)
AST: 27 IU/L (ref 0–40)
Albumin: 4.4 g/dL (ref 3.6–4.6)
Alkaline Phosphatase: 85 IU/L (ref 39–117)
Bilirubin Total: 0.4 mg/dL (ref 0.0–1.2)
Bilirubin, Direct: 0.15 mg/dL (ref 0.00–0.40)
Total Protein: 6.9 g/dL (ref 6.0–8.5)

## 2018-11-25 LAB — CBC
Hematocrit: 37.2 % (ref 34.0–46.6)
Hemoglobin: 11.5 g/dL (ref 11.1–15.9)
MCH: 27.8 pg (ref 26.6–33.0)
MCHC: 30.9 g/dL — ABNORMAL LOW (ref 31.5–35.7)
MCV: 90 fL (ref 79–97)
Platelets: 337 10*3/uL (ref 150–450)
RBC: 4.14 x10E6/uL (ref 3.77–5.28)
RDW: 14.2 % (ref 11.7–15.4)
WBC: 9 10*3/uL (ref 3.4–10.8)

## 2018-11-25 LAB — BASIC METABOLIC PANEL
BUN/Creatinine Ratio: 31 — ABNORMAL HIGH (ref 12–28)
BUN: 21 mg/dL (ref 8–27)
CO2: 20 mmol/L (ref 20–29)
Calcium: 9.1 mg/dL (ref 8.7–10.3)
Chloride: 99 mmol/L (ref 96–106)
Creatinine, Ser: 0.68 mg/dL (ref 0.57–1.00)
GFR calc Af Amer: 93 mL/min/{1.73_m2} (ref >59)
GFR calc non Af Amer: 81 mL/min/{1.73_m2} (ref >59)
Glucose: 101 mg/dL — ABNORMAL HIGH (ref 65–99)
Potassium: 5 mmol/L (ref 3.5–5.2)
Sodium: 134 mmol/L (ref 134–144)

## 2018-11-25 LAB — LIPID PANEL
Chol/HDL Ratio: 1.7 ratio (ref 0.0–4.4)
Cholesterol, Total: 118 mg/dL (ref 100–199)
HDL: 71 mg/dL (ref >39)
LDL Chol Calc (NIH): 29 mg/dL (ref 0–99)
Triglycerides: 94 mg/dL (ref 0–149)
VLDL Cholesterol Cal: 18 mg/dL (ref 5–40)

## 2018-11-26 ENCOUNTER — Telehealth: Payer: Self-pay

## 2018-11-26 NOTE — Telephone Encounter (Signed)
Spoke to pt, gave detailed instructions for a 30 day Event Monitor. Verified address. [employer] monitor ordered to be delivered to pt's home.

## 2018-11-28 ENCOUNTER — Ambulatory Visit (HOSPITAL_COMMUNITY): Admit: 2018-11-28 | Payer: Medicare HMO | Admitting: Cardiovascular Disease

## 2018-11-28 ENCOUNTER — Encounter (HOSPITAL_COMMUNITY): Payer: Self-pay

## 2018-11-28 SURGERY — CARDIOVERSION
Anesthesia: General

## 2018-12-03 ENCOUNTER — Other Ambulatory Visit: Payer: Self-pay

## 2018-12-03 ENCOUNTER — Encounter (INDEPENDENT_AMBULATORY_CARE_PROVIDER_SITE_OTHER): Payer: Medicare HMO | Admitting: Ophthalmology

## 2018-12-03 DIAGNOSIS — I10 Essential (primary) hypertension: Secondary | ICD-10-CM

## 2018-12-03 DIAGNOSIS — H43813 Vitreous degeneration, bilateral: Secondary | ICD-10-CM

## 2018-12-03 DIAGNOSIS — H35033 Hypertensive retinopathy, bilateral: Secondary | ICD-10-CM

## 2018-12-03 DIAGNOSIS — H35341 Macular cyst, hole, or pseudohole, right eye: Secondary | ICD-10-CM

## 2018-12-04 ENCOUNTER — Ambulatory Visit (INDEPENDENT_AMBULATORY_CARE_PROVIDER_SITE_OTHER): Payer: Medicare HMO

## 2018-12-04 ENCOUNTER — Encounter: Payer: Self-pay | Admitting: Cardiovascular Disease

## 2018-12-04 DIAGNOSIS — I4819 Other persistent atrial fibrillation: Secondary | ICD-10-CM | POA: Diagnosis not present

## 2018-12-04 DIAGNOSIS — I4811 Longstanding persistent atrial fibrillation: Secondary | ICD-10-CM | POA: Diagnosis not present

## 2018-12-04 DIAGNOSIS — I4892 Unspecified atrial flutter: Secondary | ICD-10-CM

## 2018-12-05 ENCOUNTER — Telehealth: Payer: Self-pay

## 2018-12-05 NOTE — Telephone Encounter (Signed)
Received the pts Day 1 monitor showing she is in Afib and after talking with Dr. Acie Fredrickson... pt instructed to push her button that you would normally push when you you are having symptoms to push it when she is feeling "normal and well"... pt verbalized understanding and agrees. Will continue to monitor.

## 2018-12-12 DIAGNOSIS — H43813 Vitreous degeneration, bilateral: Secondary | ICD-10-CM | POA: Diagnosis not present

## 2018-12-12 DIAGNOSIS — H35371 Puckering of macula, right eye: Secondary | ICD-10-CM | POA: Diagnosis not present

## 2018-12-12 DIAGNOSIS — H524 Presbyopia: Secondary | ICD-10-CM | POA: Diagnosis not present

## 2018-12-12 DIAGNOSIS — H04123 Dry eye syndrome of bilateral lacrimal glands: Secondary | ICD-10-CM | POA: Diagnosis not present

## 2018-12-18 DIAGNOSIS — Z0100 Encounter for examination of eyes and vision without abnormal findings: Secondary | ICD-10-CM | POA: Diagnosis not present

## 2018-12-25 ENCOUNTER — Telehealth: Payer: Self-pay | Admitting: Cardiovascular Disease

## 2018-12-25 MED ORDER — FUROSEMIDE 20 MG PO TABS: 20.0000 mg | ORAL_TABLET | Freq: Every day | ORAL | 6 refills | Status: DC | PRN

## 2018-12-25 NOTE — Telephone Encounter (Signed)
Spoke with patient about her request for a refill on Lasix and to ask if she is having worsening swelling, weight gain, or SOB. States she has started taking Myrbetriq for bladder control and states she probably does retain a little more fluid since starting it. Reports that her right ankle is more swollen than the left but she associates weight gain with better appetite. States "I am eating healthy food but eating a lot of it." Reports BP more consistently 130/80's now which is a little higher than previous but she no longer has the "sinking" feeling that she had when she took a different BP medication and had lower BP readings in the past. I advised that I will refill her Lasix 20 mg for prn use and that some of her ankle edema may also be caused by diltiazem. She will continue to monitor her weight, swelling, and breathing and will call back with concerns. She states she will be returning the monitor in about 1 week and looks forward to hearing the report. She thanked me for the call.

## 2018-12-25 NOTE — Telephone Encounter (Signed)
Patient continues to have swelling issues and she is out of furosemide (LASIX) 20 MG tablet. She wants to know whether or not to go back to taking the prescription. If so, she will need a new prescription sent to [place], [place] - [address]

## 2018-12-26 NOTE — Telephone Encounter (Signed)
I agree with plan as outlined by Christen Bame, RN

## 2019-01-04 ENCOUNTER — Other Ambulatory Visit: Payer: Self-pay | Admitting: Cardiovascular Disease

## 2019-01-08 ENCOUNTER — Other Ambulatory Visit: Payer: Self-pay | Admitting: Cardiovascular Disease

## 2019-01-08 MED ORDER — AMIODARONE HCL 200 MG PO TABS: 200.0000 mg | ORAL_TABLET | Freq: Every day | ORAL | 3 refills | Status: DC

## 2019-01-08 NOTE — Telephone Encounter (Signed)
Pt's medication was sent to pt's pharmacy as requested. Confirmation received.  °

## 2019-01-15 ENCOUNTER — Other Ambulatory Visit: Payer: Self-pay

## 2019-01-15 ENCOUNTER — Encounter: Payer: Self-pay | Admitting: Cardiovascular Disease

## 2019-01-15 ENCOUNTER — Ambulatory Visit: Payer: Medicare HMO | Admitting: Cardiovascular Disease

## 2019-01-15 VITALS — BP 100/72 | HR 53 | Ht 62.0 in | Wt 133.4 lb

## 2019-01-15 DIAGNOSIS — I484 Atypical atrial flutter: Secondary | ICD-10-CM

## 2019-01-15 DIAGNOSIS — I4819 Other persistent atrial fibrillation: Secondary | ICD-10-CM | POA: Diagnosis not present

## 2019-01-15 NOTE — Progress Notes (Signed)
Cardiology Office Note   Date:  01/15/2019   ID:  Rachel Park, DOB 05-18-1931, MRN [id number]  PCP:  Lajean Manes, MD  Cardiologist:   Mertie Moores, MD   Chief Complaint  Patient presents with  . Atrial Flutter    problem list 1. Coronary artery  disease-status post stenting of her proximal LAD 2.  CVA- occurred during the stenting 3.  Atrial Fibrillation    Previous notes  Rachel Park is a 85 y.o. female who presents for follow up of her stenting She has done well.  Is a bit anxious - may account for her elevated BP today .  Memory has improved ,  No residual CVA symptoms.   Feb. 1, 2017: Doing well Has some fatigue. Has some bruising on lower logs  -  On Plavix   June 21, 2015:  Rachel Park was diagnosed with atrial fib, was started on eliquis 5 mg BID.  She was in [place] with her sister. Developed abdominal pain .   Severe hypoxemia .   In great distress. Was found to have a small bowell perforation .   Was in the ICU for 2 days.   Developed hypotension , was started on levophed,   Developed atrial fib  plavix was stopped,   Heparin was started.   Has converted to NSR at this time  Has had some band like chest pain since that time Has shortness of breath - at rest and with exertion .     September 22, 2015:  Smt is doing well.  We DC'd her Eliquis - due to bleeding.   She is on Plavix  Her only episode of a-fib was in the setting of a small bowel obstruction   Is very fatigued.   Was put on Iron tablet by Dr. Felipa Eth. Hb is still low  [place] also increased her Coreg to 25 BID .  BP readings at home have been well controlled.  Has not had any syncope Goes for a walk every day and is fatigued when she is done with the walk.   No CP .  Has occasional chest tightness .  Same symptoms back in April - [name] was normal in April, 2017   Feb. 16, 2018:  Rachel Park is seen today . Has had surgery to repair 4 hernias.   Cant get her energy  Its  only been a month - I reminded her that it takes time to heal after surgery .  No CP or dyspnea.   Sept. 24, 2018:  Rachel Park is seen today  Had some CP in July Repeat cath shows no obstructive CAD ,  LAD stent was widely patent  Has had some bursitis of left hip and knee.  No CP .   Thought to have reflux.   Tried Zantac, caused diarrhea, did not continue the zantac.   She has tried Crestor 10 mg in the past. But we had to decrease the dose to 5 g because of muscle aches. She thinks that she might be eating little bit more than she should.   May 23, 2017:  Doing well.  Has been working with the pharmacist at Dr. Sherryll Burger office  They are concerned about her HR in the 97s .  Has not had any syncope.   Has profound lack of energy - several hours after taking the coreg   Sept. 17, 2019: Rachel Park is seen for follow up of her CAD and PAF HR is typically  low. Has not had any further episodes of presyncope since we reduced the dose of Coreg. Still eats a bit more salt than she should   September 24, 2018  Rachel Park was admitted with ischemia bowel on Jul 23, 2018.   She had a prolonged ICU stay and developed AF with RVR.   She had a prolonged hospitalization with slow recovery.  She was started on Eliquis at the time of discharge.   She diuresed well   She was seen by Cecilie Kicks, NP a month ago .  Seemed to be doing well,  Was started back on amiodarone. Her torsemide has been changed to PRN  Breathing is better.  Eating well.   Abdomen is better.   Aug. 19, 2020   Rachel Park is seen today . She was seen a month ago and was set up for a cardioversion.  She had been started on amiodarone and was in NSR when she showed up for her cardioverison She reported some tachycardia and generalized weakness  recently. She was thought to have developed recurrent AF and her amiodarone was increased to 200 mg BID .   Sept. 10, 2020  Rachel Park is seen for follow up of her atrial flutter. I saw her several  weeks ago and found her to be in atrial flutter.   We doubled her amiodarone and had her return today for follow up  Nov. 11 , 2020  Rachel Park is seen today for follow up of atrial flutter  Ankles have been swollen for the past month She had run out of her lasix that she had received from the hospital  She takes lasix 20 mg as needed - has not needed it for the past 3 weeks.   Past Medical History:  Diagnosis Date  . Arthritis   . Complication of anesthesia   . Coronary artery disease   . Hypertension   . Hypothyroidism   . PAF (paroxysmal atrial fibrillation) ([place])    on Xarelto for maybe a year, opted to stop  . PONV (postoperative nausea and vomiting)   . Renal infarction ([place])   . Small bowel obstruction ([place]) 06/2015  . Stroke [facility])    oct 2016  . TIA (transient ischemic attack) 03/24/2015  . Vertigo, benign positional    to be evaluated, intermittent    Past Surgical History:  Procedure Laterality Date  . ABDOMINAL HYSTERECTOMY    . APPLICATION OF WOUND VAC N/A 07/23/2018   Procedure: APPLICATION OF WOUND VAC;  Surgeon: Rolm Bookbinder, MD;  Location: [facility];  Service: General;  Laterality: N/A;  . BOWEL RESECTION N/A 07/23/2018   Procedure: SMALL BOWEL RESECTION;  Surgeon: Rolm Bookbinder, MD;  Location: [place] [facility];  Service: General;  Laterality: N/A;  . CARDIAC CATHETERIZATION N/A 12/24/2014   Procedure: Left Heart Cath and Coronary Angiography;  Surgeon: Sherren Mocha, MD;  Location: [facility] CV LAB;  Service: Cardiovascular;  Laterality: N/A;  . CARDIOVERSION N/A 05/22/2018   Procedure: CARDIOVERSION;  Surgeon: Josue Hector, MD;  Location: [facility] ENDOSCOPY;  Service: Cardiovascular;  Laterality: N/A;  . CATARACT EXTRACTION Bilateral   . COLONOSCOPY WITH PROPOFOL N/A 12/01/2013   Procedure: COLONOSCOPY WITH PROPOFOL;  Surgeon: Garlan Fair, MD;  Location: [facility] ENDOSCOPY;  Service: Endoscopy;  Laterality: N/A;  . COMPLEX WOUND CLOSURE  07/25/2018   Procedure: Complex  Wound Closure;  Surgeon: Rolm Bookbinder, MD;  Location: [facility];  Service: General;;  . Pennie Rushing  07/25/2018   Procedure: Ileocecetomy;  Surgeon: Donne Hazel,  Rodman Key, MD;  Location: [facility];  Service: General;;  . INCISIONAL HERNIA REPAIR N/A 03/10/2016   Procedure: INCISIONAL HERNIA REPAIR TIMES TWO;  Surgeon: Armandina Gemma, MD;  Location: [facility];  Service: General;  Laterality: N/A;  . INGUINAL HERNIA REPAIR Bilateral 03/10/2016   Procedure: BILATERAL INGUINAL HERNIA REPAIRS;  Surgeon: Armandina Gemma, MD;  Location: [facility];  Service: General;  Laterality: Bilateral;  . INSERTION OF MESH N/A 03/10/2016   Procedure: INSERTION OF MESH TO BILATERAL GROINS AND ABDOMEN;  Surgeon: Armandina Gemma, MD;  Location: [facility];  Service: General;  Laterality: N/A;  . laparotomy  06/2015  . LAPAROTOMY N/A 07/23/2018   Procedure: EXPLORATORY LAPAROTOMY;  Surgeon: Rolm Bookbinder, MD;  Location: [facility];  Service: General;  Laterality: N/A;  . LEFT HEART CATH AND CORONARY ANGIOGRAPHY N/A 09/27/2016   Procedure: Left Heart Cath and Coronary Angiography;  Surgeon: Sherren Mocha, MD;  Location: [facility];  Service: Cardiovascular;  Laterality: N/A;  . TONSILLECTOMY       Current Outpatient Medications  Medication Sig Dispense Refill  . acetaminophen (TYLENOL) 500 MG tablet Take 1,000 mg by mouth every 6 (six) hours as needed for mild pain.     Marland Kitchen amiodarone (PACERONE) 200 MG tablet Take 1 tablet (200 mg total) by mouth daily. 90 tablet 3  . apixaban (ELIQUIS) 5 MG TABS tablet Take 1 tablet (5 mg total) by mouth 2 (two) times daily. 180 tablet 3  . Calcium Carb-Cholecalciferol (CALCIUM + D3 PO) Take 1 tablet by mouth daily at 12 noon.    . Cholecalciferol (VITAMIN D3) 50 MCG (2000 UT) TABS Take 2,000 Units by mouth daily with lunch.    . diltiazem (CARDIZEM CD) 180 MG 24 hr capsule Take 1 capsule by mouth once daily 90 capsule 3  . furosemide (LASIX) 20 MG tablet Take 1 tablet (20 mg total) by mouth daily as needed  for edema. 30 tablet 6  . levothyroxine (SYNTHROID, LEVOTHROID) 75 MCG tablet Take 75 mcg by mouth daily before breakfast.    . mirabegron ER (MYRBETRIQ) 25 MG TB24 tablet Take 25 mg by mouth daily.    . Multiple Vitamin (MULTIVITAMIN WITH MINERALS) TABS tablet Take 1 tablet by mouth daily.    Marland Kitchen NITROSTAT 0.4 MG SL tablet Place 1 tablet (0.4 mg total) under the tongue every 5 (five) minutes as needed. Chest pain 25 tablet 6  . Polyethyl Glycol-Propyl Glycol (SYSTANE ULTRA) 0.4-0.3 % SOLN Place 1-2 drops into both eyes 3 (three) times daily as needed (dry eyes).    . psyllium (HYDROCIL/METAMUCIL) 95 % PACK Take 1 packet by mouth daily.    . rosuvastatin (CRESTOR) 10 MG tablet Take 1 tablet by mouth once daily 90 tablet 3   No current facility-administered medications for this visit.     Allergies:   Propofol    Social History:  The patient  reports that she has never smoked. She has never used smokeless tobacco. She reports previous alcohol use. She reports that she does not use drugs.   Family History:  The patient's family history includes Cancer in her sister; Heart attack (age of onset: 38) in her father; Stroke in her mother.    ROS:     Physical Exam: Blood pressure 100/72, pulse (!) 53, height 5\' 2"  (1.575 m), weight 133 lb 6.4 oz (60.5 kg), SpO2 97 %.  GEN:  Well nourished, well developed in no acute distress HEENT: Normal NECK: No JVD; No carotid bruits LYMPHATICS: No  lymphadenopathy CARDIAC: RRR , no murmurs, rubs, gallops RESPIRATORY:  Clear to auscultation without rales, wheezing or rhonchi  ABDOMEN: Soft, non-tender, non-distended MUSCULOSKELETAL:  No edema; No deformity  SKIN: Warm and dry NEUROLOGIC:  Alert and oriented x 3     EKG:       Recent Labs: 04/21/2018: TSH 2.863 08/12/2018: Magnesium 1.7 11/25/2018: ALT 21; BUN 21; Creatinine, Ser 0.68; Hemoglobin 11.5; Platelets 337; Potassium 5.0; Sodium 134    Lipid Panel    Component Value Date/Time   CHOL  118 11/25/2018 0953   TRIG 94 11/25/2018 0953   HDL 71 11/25/2018 0953   CHOLHDL 1.7 11/25/2018 0953   CHOLHDL 2.3 06/30/2015 0848   VLDL 14 06/30/2015 0848   LDLCALC 29 11/25/2018 0953      Wt Readings from Last 3 Encounters:  01/15/19 133 lb 6.4 oz (60.5 kg)  11/14/18 134 lb 12.8 oz (61.1 kg)  10/23/18 135 lb 12.8 oz (61.6 kg)      Other studies Reviewed: Additional studies/ records that were reviewed today include: . Review of the above records demonstrates:    ASSESSMENT AND PLAN:  1.  CAD -   No angina .   Will check lipids in the next several months   2. Persistent  atrial fib - CHADS2VASC schore of 4-6  ( female, age > 31, vascular disease,   TIA and a CVA ( as a complication to her cath)   .   We placed a 30-day event monitor on her and she was in atrial flutter constantly.  She overall is having generalized lack of energy but is not acutely short of breath and does not have any chest pain.  We discussed the possibility of sending her for cardioversion 1 additional time.  She is been set up for cardioversion in the past but when she arrived for a cardioversion, she had converted to normal sinus rhythm.  If we cardiovert her and she feels better then we should continue the amiodarone to maintain sinus rhythm and hopefully keep her feeling well.  If she cannot tell any difference between normal sinus rhythm and atrial fibrillation then I think we should discontinue the amiodarone and just proceed with rate control and anticoagulation.  She will discuss with her son will call us back later this week.  I will see her again in 2 months.  Her heart rate is rather slow today.  We will discontinue the metoprolol and continue the diltiazem and amiodarone for now.  If she decides not to have a cardioversion then we will restart the metoprolol and discontinue the amiodarone.  Hopefully she will feel better with a slightly faster heart rate.   3. Hyperlipidemia:    Stable   4.  Sinus  bradycardia.:    5.  Hypertension::     Current medicines are reviewed at length with the patient today.  The patient does not have concerns regarding medicines.  The following changes have been made:  no change  Labs/ tests ordered today include:   No orders of the defined types were placed in this encounter.   Disposition:     Mertie Moores, MD  01/15/2019 10:26 AM    [facility] [place], [place], [place]  [address] Phone: [phone]; Fax: [phone]

## 2019-01-15 NOTE — Patient Instructions (Addendum)
Medication Instructions:  1) STOP METOPROLOL  Follow-Up: You have a follow-up appointment with Dr. Acie Fredrickson on 03/18/2019 at 9:40AM.  Any Other Special Instructions Will Be Listed Below (If Applicable). From Dr. Acie Fredrickson: We will stop the metoprolol which will allow your heart rate to go faster and may give you more energy.  If you decide not to have a cardioversion then we will restart the metoprolol but stop the amiodarone to avoid amiodarone toxicity. We will continue the Eliquis either way. I will see in 2 months for follow-up-sooner if needed.  Call us to arrange the cardioversion if you decide to do it.

## 2019-02-05 ENCOUNTER — Telehealth: Payer: Self-pay | Admitting: Cardiovascular Disease

## 2019-02-05 MED ORDER — LOSARTAN POTASSIUM 50 MG PO TABS: 50.0000 mg | ORAL_TABLET | Freq: Every day | ORAL | 3 refills | Status: DC

## 2019-02-05 NOTE — Telephone Encounter (Signed)
New Message  Pt calling about decision cardioversion procedure for Dr. Acie Fredrickson  Please call

## 2019-02-05 NOTE — Telephone Encounter (Signed)
Spoke with patient who states she is calling to inform Dr. Acie Fredrickson that she does not want to have another cardioversion. She states she has felt well since stopping metoprolol at her last office visit on 11/11. Reports BP is higher since stopping metoprolol. Today @ 0900 BP 156/68 mmHg, pulse 63 bpm. She is calling for direction regarding her medications per her last conversation with Dr. Acie Fredrickson. He advised that she stop amiodarone and start losartan 50 mg for BP control. He is not going to restart metoprolol at this time due to pulse of 63 bpm and that it will take a while for the amiodarone to get out of her system.  I advised her to monitor BP and pulse and to call if she has questions or concerns prior to her appointment in January with Dr. Acie Fredrickson. We discussed diet and she admits to eating a lot of foods high in potassium. She will decrease her servings of high potassium foods and we will check her basic metabolic profile when she comes in on 1/12. She verbalized understanding and agreement with plan of care and thanked me for our help.

## 2019-03-17 ENCOUNTER — Encounter: Payer: Self-pay | Admitting: Cardiovascular Disease

## 2019-03-17 NOTE — Progress Notes (Signed)
Cardiology Office Note   Date:  03/18/2019   ID:  Rachel Park, DOB [birth year], MRN [id number]  PCP:  Lajean Manes, MD  Cardiologist:   Mertie Moores, MD   Chief Complaint  Patient presents with  . Atrial Fibrillation  . Coronary Artery Disease    problem list 1. Coronary artery  disease-status post stenting of her proximal LAD 2.  CVA- occurred during the stenting 3.  Atrial Fibrillation    Previous notes  Rachel Park is a 86 y.o. female who presents for follow up of her stenting She has done well.  Is a bit anxious - may account for her elevated BP today .  Memory has improved ,  No residual CVA symptoms.   Feb. 1, 2017: Doing well Has some fatigue. Has some bruising on lower logs  -  On Plavix   June 21, 2015:  Rachel Park was diagnosed with atrial fib, was started on eliquis 5 mg BID.  She was in [place] with her sister. Developed abdominal pain .   Severe hypoxemia .   In great distress. Was found to have a small bowell perforation .   Was in the ICU for 2 days.   Developed hypotension , was started on levophed,   Developed atrial fib  plavix was stopped,   Heparin was started.   Has converted to NSR at this time  Has had some band like chest pain since that time Has shortness of breath - at rest and with exertion .     September 22, 2015:  Rachel Park is doing well.  We DC'd her Eliquis - due to bleeding.   She is on Plavix  Her only episode of a-fib was in the setting of a small bowel obstruction   Is very fatigued.   Was put on Iron tablet by Dr. Felipa Eth. Hb is still low  [place] also increased her Coreg to 25 BID .  BP readings at home have been well controlled.  Has not had any syncope Goes for a walk every day and is fatigued when she is done with the walk.   No CP .  Has occasional chest tightness .  Same symptoms back in April - [name] was normal in April, 2017   Feb. 16, 2018:  Rachel Park is seen today . Has had surgery to repair 4  hernias.   Cant get her energy  Its only been a month - I reminded her that it takes time to heal after surgery .  No CP or dyspnea.   Sept. 24, 2018:  Rachel Park is seen today  Had some CP in July Repeat cath shows no obstructive CAD ,  LAD stent was widely patent  Has had some bursitis of left hip and knee.  No CP .   Thought to have reflux.   Tried Zantac, caused diarrhea, did not continue the zantac.   She has tried Crestor 10 mg in the past. But we had to decrease the dose to 5 g because of muscle aches. She thinks that she might be eating little bit more than she should.   May 23, 2017:  Doing well.  Has been working with the pharmacist at Dr. Sherryll Burger office  They are concerned about her HR in the 88s .  Has not had any syncope.   Has profound lack of energy - several hours after taking the coreg   Sept. 17, 2019: Rachel Park is seen for follow up of her CAD  and PAF HR is typically low. Has not had any further episodes of presyncope since we reduced the dose of Coreg. Still eats a bit more salt than she should   September 24, 2018  Rachel Park was admitted with ischemia bowel on Jul 23, 2018.   She had a prolonged ICU stay and developed AF with RVR.   She had a prolonged hospitalization with slow recovery.  She was started on Eliquis at the time of discharge.   She diuresed well   She was seen by Cecilie Kicks, NP a month ago .  Seemed to be doing well,  Was started back on amiodarone. Her torsemide has been changed to PRN  Breathing is better.  Eating well.   Abdomen is better.   Aug. 19, 2020   Rachel Park is seen today . She was seen a month ago and was set up for a cardioversion.  She had been started on amiodarone and was in NSR when she showed up for her cardioverison She reported some tachycardia and generalized weakness  recently. She was thought to have developed recurrent AF and her amiodarone was increased to 200 mg BID .   Sept. 10, 2020  Rachel Park is seen for follow up of  her atrial flutter. I saw her several weeks ago and found her to be in atrial flutter.   We doubled her amiodarone and had her return today for follow up  Nov. 11 , 2020  Rachel Park is seen today for follow up of atrial flutter  Ankles have been swollen for the past month She had run out of her lasix that she had received from the hospital  She takes lasix 20 mg as needed - has not needed it for the past 3 weeks.   Jan. 12, 2021  Rachel Park is seen today for follow up of her CAD and atrial fib She has persistent AF.   We stopped her amiodarone several months ago and have gone with a rate control and anticoagulation strategy Has had more fatigue recently .     Past Medical History:  Diagnosis Date  . Arthritis   . Complication of anesthesia   . Coronary artery disease   . Hypertension   . Hypothyroidism   . PAF (paroxysmal atrial fibrillation) ([place])    on Xarelto for maybe a year, opted to stop  . PONV (postoperative nausea and vomiting)   . Renal infarction ([place])   . Small bowel obstruction ([place]) 06/2015  . Stroke [facility])    oct 2016  . TIA (transient ischemic attack) 03/24/2015  . Vertigo, benign positional    to be evaluated, intermittent    Past Surgical History:  Procedure Laterality Date  . ABDOMINAL HYSTERECTOMY    . APPLICATION OF WOUND VAC N/A 07/23/2018   Procedure: APPLICATION OF WOUND VAC;  Surgeon: Rolm Bookbinder, MD;  Location: [place];  Service: General;  Laterality: N/A;  . BOWEL RESECTION N/A 07/23/2018   Procedure: SMALL BOWEL RESECTION;  Surgeon: Rolm Bookbinder, MD;  Location: [place];  Service: General;  Laterality: N/A;  . CARDIAC CATHETERIZATION N/A 12/24/2014   Procedure: Left Heart Cath and Coronary Angiography;  Surgeon: Sherren Mocha, MD;  Location: [place] CV LAB;  Service: Cardiovascular;  Laterality: N/A;  . CARDIOVERSION N/A 05/22/2018   Procedure: CARDIOVERSION;  Surgeon: Josue Hector, MD;  Location: [facility] END[facility];  Service: Cardiovascular;   Laterality: N/A;  . CATARACT EXTRACTION Bilateral   . COLONOSCOPY WITH PROPOFOL N/A 12/01/2013   Procedure: COLONOSCOPY  WITH PROPOFOL;  Surgeon: Garlan Fair, MD;  Location: [facility] ENDOSCOPY;  Service: Endoscopy;  Laterality: N/A;  . COMPLEX WOUND CLOSURE  07/25/2018   Procedure: Complex Wound Closure;  Surgeon: Rolm Bookbinder, MD;  Location: [place];  Service: General;;  . Pennie Rushing  07/25/2018   Procedure: Ileocecetomy;  Surgeon: Rolm Bookbinder, MD;  Location: [place];  Service: General;;  . INCISIONAL HERNIA REPAIR N/A 03/10/2016   Procedure: INCISIONAL HERNIA REPAIR TIMES TWO;  Surgeon: Armandina Gemma, MD;  Location: [place];  Service: General;  Laterality: N/A;  . INGUINAL HERNIA REPAIR Bilateral 03/10/2016   Procedure: BILATERAL INGUINAL HERNIA REPAIRS;  Surgeon: Armandina Gemma, MD;  Location: [place];  Service: General;  Laterality: Bilateral;  . INSERTION OF MESH N/A 03/10/2016   Procedure: INSERTION OF MESH TO BILATERAL GROINS AND ABDOMEN;  Surgeon: Armandina Gemma, MD;  Location: [place];  Service: General;  Laterality: N/A;  . laparotomy  06/2015  . LAPAROTOMY N/A 07/23/2018   Procedure: EXPLORATORY LAPAROTOMY;  Surgeon: Rolm Bookbinder, MD;  Location: [place];  Service: General;  Laterality: N/A;  . LEFT HEART CATH AND CORONARY ANGIOGRAPHY N/A 09/27/2016   Procedure: Left Heart Cath and Coronary Angiography;  Surgeon: Sherren Mocha, MD;  Location: [place] CV LAB;  Service: Cardiovascular;  Laterality: N/A;  . TONSILLECTOMY       Current Outpatient Medications  Medication Sig Dispense Refill  . acetaminophen (TYLENOL) 500 MG tablet Take 1,000 mg by mouth every 6 (six) hours as needed for mild pain.     Marland Kitchen apixaban (ELIQUIS) 5 MG TABS tablet Take 1 tablet (5 mg total) by mouth 2 (two) times daily. 180 tablet 3  . Calcium Carb-Cholecalciferol (CALCIUM + D3 PO) Take 1 tablet by mouth daily at 12 noon.    . Cholecalciferol (VITAMIN D3) 50 MCG (2000 UT) TABS Take 2,000 Units by mouth daily with  lunch.    . diltiazem (CARDIZEM CD) 180 MG 24 hr capsule Take 1 capsule by mouth once daily 90 capsule 3  . levothyroxine (SYNTHROID, LEVOTHROID) 75 MCG tablet Take 75 mcg by mouth daily before breakfast.    . losartan (COZAAR) 50 MG tablet Take 1 tablet (50 mg total) by mouth daily. 90 tablet 3  . mirabegron ER (MYRBETRIQ) 25 MG TB24 tablet Take 25 mg by mouth daily.    . Multiple Vitamin (MULTIVITAMIN WITH MINERALS) TABS tablet Take 1 tablet by mouth daily.    Marland Kitchen NITROSTAT 0.4 MG SL tablet Place 1 tablet (0.4 mg total) under the tongue every 5 (five) minutes as needed. Chest pain 25 tablet 6  . Polyethyl Glycol-Propyl Glycol (SYSTANE ULTRA) 0.4-0.3 % SOLN Place 1-2 drops into both eyes 3 (three) times daily as needed (dry eyes).    . rosuvastatin (CRESTOR) 10 MG tablet Take 1 tablet by mouth once daily 90 tablet 3   No current facility-administered medications for this visit.    Allergies:   Propofol    Social History:  The patient  reports that she has never smoked. She has never used smokeless tobacco. She reports previous alcohol use. She reports that she does not use drugs.   Family History:  The patient's family history includes Cancer in her sister; Heart attack (age of onset: 22) in her father; Stroke in her mother.    ROS:     Physical Exam: Blood pressure (!) 142/82, pulse 62, height 5\' 2"  (1.575 m), weight 132 lb 12.8 oz (60.2 kg), SpO2 98 %.  GEN:  Well nourished,  well developed in no acute distress HEENT: Normal NECK: No JVD; No carotid bruits LYMPHATICS: No lymphadenopathy CARDIAC: RRR , no murmurs, rubs, gallops RESPIRATORY:  Clear to auscultation without rales, wheezing or rhonchi  ABDOMEN: Soft, non-tender, non-distended MUSCULOSKELETAL:  No edema; No deformity  SKIN: Warm and dry NEUROLOGIC:  Alert and oriented x 3   EKG:       Recent Labs: 04/21/2018: TSH 2.863 08/12/2018: Magnesium 1.7 11/25/2018: ALT 21; BUN 21; Creatinine, Ser 0.68; Hemoglobin 11.5;  Platelets 337; Potassium 5.0; Sodium 134    Lipid Panel    Component Value Date/Time   CHOL 118 11/25/2018 0953   TRIG 94 11/25/2018 0953   HDL 71 11/25/2018 0953   CHOLHDL 1.7 11/25/2018 0953   CHOLHDL 2.3 06/30/2015 0848   VLDL 14 06/30/2015 0848   LDLCALC 29 11/25/2018 0953      Wt Readings from Last 3 Encounters:  03/18/19 132 lb 12.8 oz (60.2 kg)  01/15/19 133 lb 6.4 oz (60.5 kg)  11/14/18 134 lb 12.8 oz (61.1 kg)      Other studies Reviewed: Additional studies/ records that were reviewed today include: . Review of the above records demonstrates:    ASSESSMENT AND PLAN:  1.  CAD -    No angina,  Cont current meds.    2. Persistent  atrial fib/ atrial flutter  - CHADS2VASC schore of 4-6  ( female, age > 75, vascular disease,   TIA and a CVA ( as a complication to her cath)   .  HR is very well controlled and is regular . Cont diltiazem      3. Hyperlipidemia:    Stable   4.  Sinus bradycardia.:    5.  Hypertension::  BP is mildly elevated .   Will continue to follow .  We may need to add amlodpine.  She is concerned that the losartan is causing some weakness / fatigue    Current medicines are reviewed at length with the patient today.  The patient does not have concerns regarding medicines.  The following changes have been made:  no change  Labs/ tests ordered today include:   No orders of the defined types were placed in this encounter.   Disposition:     Mertie Moores, MD  03/18/2019 10:17 AM    [facility] [address], [place], [place]  [address] Phone: [phone]; Fax: [phone]

## 2019-03-18 ENCOUNTER — Ambulatory Visit (INDEPENDENT_AMBULATORY_CARE_PROVIDER_SITE_OTHER): Payer: Medicare HMO | Admitting: Cardiovascular Disease

## 2019-03-18 ENCOUNTER — Other Ambulatory Visit: Payer: Self-pay

## 2019-03-18 ENCOUNTER — Ambulatory Visit: Payer: Medicare Other | Attending: Internal Medicine

## 2019-03-18 ENCOUNTER — Encounter: Payer: Self-pay | Admitting: Cardiovascular Disease

## 2019-03-18 VITALS — BP 142/82 | HR 62 | Ht 62.0 in | Wt 132.8 lb

## 2019-03-18 DIAGNOSIS — Z23 Encounter for immunization: Secondary | ICD-10-CM | POA: Insufficient documentation

## 2019-03-18 DIAGNOSIS — I25119 Atherosclerotic heart disease of native coronary artery with unspecified angina pectoris: Secondary | ICD-10-CM | POA: Diagnosis not present

## 2019-03-18 DIAGNOSIS — I4819 Other persistent atrial fibrillation: Secondary | ICD-10-CM | POA: Diagnosis not present

## 2019-03-18 NOTE — Patient Instructions (Addendum)
Medication Instructions:  Your physician has recommended you make the following change in your medication:  STOP Furosemide (Lasix)  *If you need a refill on your cardiac medications before your next appointment, please call your pharmacy*  Lab Work: None Ordered   Testing/Procedures: None Ordered   Follow-Up: At [facility], you and your health needs are our priority.  As part of our continuing mission to provide you with exceptional heart care, we have created designated Provider Care Teams.  These Care Teams include your primary Cardiologist (physician) and Advanced Practice Providers (APPs -  Physician Assistants and Nurse Practitioners) who all work together to provide you with the care you need, when you need it.  Your next appointment:   3 month(s) on Monday April 12 at 10:20 am  The format for your next appointment:   Either In Person or Virtual - scheduled to be an in person visit but can be changed to virtual if needed  Provider:   Mertie Moores, MD

## 2019-03-18 NOTE — Progress Notes (Signed)
   Covid-19 Vaccination Clinic  Name:  Rachel Park    MRN: [id number] DOB: 1931/08/02  03/18/2019  [name] was observed post Covid-19 immunization for 30 minutes based on pre-vaccination screening without incidence. She was provided with Vaccine Information Sheet and instruction to access the V-Safe system.   [name] was instructed to call 911 with any severe reactions post vaccine: Marland Kitchen Difficulty breathing  . Swelling of your face and throat  . A fast heartbeat  . A bad rash all over your body  . Dizziness and weakness    Immunizations Administered    Name Date Dose VIS Date Route   Pfizer COVID-19 Vaccine 03/18/2019 12:44 PM 0.3 mL 02/14/2019 Intramuscular   Manufacturer: [place]   Lot: [id number]   [place]: [id number]

## 2019-03-19 ENCOUNTER — Encounter: Payer: Self-pay | Admitting: Cardiovascular Disease

## 2019-04-01 DIAGNOSIS — M8580 Other specified disorders of bone density and structure, unspecified site: Secondary | ICD-10-CM | POA: Diagnosis not present

## 2019-04-01 DIAGNOSIS — E039 Hypothyroidism, unspecified: Secondary | ICD-10-CM | POA: Diagnosis not present

## 2019-04-01 DIAGNOSIS — I25119 Atherosclerotic heart disease of native coronary artery with unspecified angina pectoris: Secondary | ICD-10-CM | POA: Diagnosis not present

## 2019-04-01 DIAGNOSIS — I480 Paroxysmal atrial fibrillation: Secondary | ICD-10-CM | POA: Diagnosis not present

## 2019-04-01 DIAGNOSIS — E7800 Pure hypercholesterolemia, unspecified: Secondary | ICD-10-CM | POA: Diagnosis not present

## 2019-04-01 DIAGNOSIS — I10 Essential (primary) hypertension: Secondary | ICD-10-CM | POA: Diagnosis not present

## 2019-04-07 ENCOUNTER — Ambulatory Visit: Payer: Medicare HMO | Attending: Internal Medicine

## 2019-04-07 DIAGNOSIS — Z23 Encounter for immunization: Secondary | ICD-10-CM

## 2019-04-07 NOTE — Progress Notes (Signed)
   Covid-19 Vaccination Clinic  Name:  Rachel Park    MRN: [id number] DOB: 1931/05/11  04/07/2019  [name] was observed post Covid-19 immunization for 15 minutes without incidence. She was provided with Vaccine Information Sheet and instruction to access the V-Safe system.   [name] was instructed to call 911 with any severe reactions post vaccine: Marland Kitchen Difficulty breathing  . Swelling of your face and throat  . A fast heartbeat  . A bad rash all over your body  . Dizziness and weakness    Immunizations Administered    Name Date Dose VIS Date Route   Pfizer COVID-19 Vaccine 04/07/2019 10:04 AM 0.3 mL 02/14/2019 Intramuscular   Manufacturer: [place]   Lot: [id number]   [place]: [id number]

## 2019-05-02 DIAGNOSIS — I25119 Atherosclerotic heart disease of native coronary artery with unspecified angina pectoris: Secondary | ICD-10-CM | POA: Diagnosis not present

## 2019-05-02 DIAGNOSIS — I480 Paroxysmal atrial fibrillation: Secondary | ICD-10-CM | POA: Diagnosis not present

## 2019-05-02 DIAGNOSIS — E7800 Pure hypercholesterolemia, unspecified: Secondary | ICD-10-CM | POA: Diagnosis not present

## 2019-05-02 DIAGNOSIS — E039 Hypothyroidism, unspecified: Secondary | ICD-10-CM | POA: Diagnosis not present

## 2019-05-02 DIAGNOSIS — M8580 Other specified disorders of bone density and structure, unspecified site: Secondary | ICD-10-CM | POA: Diagnosis not present

## 2019-05-02 DIAGNOSIS — I10 Essential (primary) hypertension: Secondary | ICD-10-CM | POA: Diagnosis not present

## 2019-05-19 DIAGNOSIS — R69 Illness, unspecified: Secondary | ICD-10-CM | POA: Diagnosis not present

## 2019-05-27 DIAGNOSIS — N390 Urinary tract infection, site not specified: Secondary | ICD-10-CM | POA: Diagnosis not present

## 2019-06-10 ENCOUNTER — Other Ambulatory Visit: Payer: Self-pay

## 2019-06-10 ENCOUNTER — Encounter (INDEPENDENT_AMBULATORY_CARE_PROVIDER_SITE_OTHER): Payer: Medicare HMO | Admitting: Ophthalmology

## 2019-06-10 DIAGNOSIS — H35341 Macular cyst, hole, or pseudohole, right eye: Secondary | ICD-10-CM

## 2019-06-10 DIAGNOSIS — I10 Essential (primary) hypertension: Secondary | ICD-10-CM | POA: Diagnosis not present

## 2019-06-10 DIAGNOSIS — H35033 Hypertensive retinopathy, bilateral: Secondary | ICD-10-CM

## 2019-06-10 DIAGNOSIS — H43813 Vitreous degeneration, bilateral: Secondary | ICD-10-CM

## 2019-06-16 ENCOUNTER — Other Ambulatory Visit: Payer: Self-pay

## 2019-06-16 ENCOUNTER — Ambulatory Visit: Payer: Medicare HMO | Admitting: Cardiovascular Disease

## 2019-06-16 ENCOUNTER — Encounter: Payer: Self-pay | Admitting: Cardiovascular Disease

## 2019-06-16 VITALS — BP 118/70 | HR 86 | Ht 62.0 in | Wt 127.4 lb

## 2019-06-16 DIAGNOSIS — I2510 Atherosclerotic heart disease of native coronary artery without angina pectoris: Secondary | ICD-10-CM | POA: Diagnosis not present

## 2019-06-16 DIAGNOSIS — I10 Essential (primary) hypertension: Secondary | ICD-10-CM

## 2019-06-16 DIAGNOSIS — E782 Mixed hyperlipidemia: Secondary | ICD-10-CM

## 2019-06-16 DIAGNOSIS — I4819 Other persistent atrial fibrillation: Secondary | ICD-10-CM

## 2019-06-16 LAB — BASIC METABOLIC PANEL
BUN/Creatinine Ratio: 16 (ref 12–28)
BUN: 11 mg/dL (ref 8–27)
CO2: 20 mmol/L (ref 20–29)
Calcium: 9.2 mg/dL (ref 8.7–10.3)
Chloride: 103 mmol/L (ref 96–106)
Creatinine, Ser: 0.67 mg/dL (ref 0.57–1.00)
GFR calc Af Amer: 93 mL/min/{1.73_m2} (ref >59)
GFR calc non Af Amer: 80 mL/min/{1.73_m2} (ref >59)
Glucose: 87 mg/dL (ref 65–99)
Potassium: 5.2 mmol/L (ref 3.5–5.2)
Sodium: 137 mmol/L (ref 134–144)

## 2019-06-16 LAB — LIPID PANEL
Chol/HDL Ratio: 1.7 ratio (ref 0.0–4.4)
Cholesterol, Total: 124 mg/dL (ref 100–199)
HDL: 72 mg/dL (ref >39)
LDL Chol Calc (NIH): 37 mg/dL (ref 0–99)
Triglycerides: 76 mg/dL (ref 0–149)
VLDL Cholesterol Cal: 15 mg/dL (ref 5–40)

## 2019-06-16 LAB — HEPATIC FUNCTION PANEL
ALT: 14 IU/L (ref 0–32)
AST: 24 IU/L (ref 0–40)
Albumin: 4.4 g/dL (ref 3.6–4.6)
Alkaline Phosphatase: 71 IU/L (ref 39–117)
Bilirubin Total: 0.6 mg/dL (ref 0.0–1.2)
Bilirubin, Direct: 0.2 mg/dL (ref 0.00–0.40)
Total Protein: 6.2 g/dL (ref 6.0–8.5)

## 2019-06-16 NOTE — Patient Instructions (Signed)
Medication Instructions:  Your physician recommends that you continue on your current medications as directed. Please refer to the Current Medication list given to you today.  *If you need a refill on your cardiac medications before your next appointment, please call your pharmacy*   Lab Work: TODAY - cholesterol, liver panel, basic metabolic panel If you have labs (blood work) drawn today and your tests are completely normal, you will receive your results only by: Marland Kitchen MyChart Message (if you have MyChart) OR . A paper copy in the mail If you have any lab test that is abnormal or we need to change your treatment, we will call you to review the results.   Testing/Procedures: None Ordered   Follow-Up: At [facility], you and your health needs are our priority.  As part of our continuing mission to provide you with exceptional heart care, we have created designated Provider Care Teams.  These Care Teams include your primary Cardiologist (physician) and Advanced Practice Providers (APPs -  Physician Assistants and Nurse Practitioners) who all work together to provide you with the care you need, when you need it.  We recommend signing up for the patient portal called "MyChart".  Sign up information is provided on this After Visit Summary.  MyChart is used to connect with patients for Virtual Visits (Telemedicine).  Patients are able to view lab/test results, encounter notes, upcoming appointments, etc.  Non-urgent messages can be sent to your provider as well.   To learn more about what you can do with MyChart, go to [email or web address].    Your next appointment:   6 month(s)  The format for your next appointment:   Either In Person or Virtual  Provider:   You may see Mertie Moores, MD or one of the following Advanced Practice Providers on your designated Care Team:    Richardson Dopp, PA-C  [place], [place]  Daune Perch, [place]

## 2019-06-16 NOTE — Progress Notes (Signed)
Cardiology Office Note   Date:  06/16/2019   ID:  Rachel Park, DOB 15-Jul-1931, MRN [id number]  PCP:  Lajean Manes, MD  Cardiologist:   Mertie Moores, MD   No chief complaint on file.   problem list 1. Coronary artery  disease-status post stenting of her proximal LAD 2.  CVA- occurred during the stenting 3.  Atrial Fibrillation    Previous notes  Rachel Park is a 86 y.o. female who presents for follow up of her stenting She has done well.  Is a bit anxious - may account for her elevated BP today .  Memory has improved ,  No residual CVA symptoms.   Feb. 1, 2017: Doing well Has some fatigue. Has some bruising on lower logs  -  On Plavix   June 21, 2015:  Rachel Park was diagnosed with atrial fib, was started on eliquis 5 mg BID.  She was in [place] with her sister. Developed abdominal pain .   Severe hypoxemia .   In great distress. Was found to have a small bowell perforation .   Was in the ICU for 2 days.   Developed hypotension , was started on levophed,   Developed atrial fib  plavix was stopped,   Heparin was started.   Has converted to NSR at this time  Has had some band like chest pain since that time Has shortness of breath - at rest and with exertion .     September 22, 2015:  Rachel Park is doing well.  We DC'd her Eliquis - due to bleeding.   She is on Plavix  Her only episode of a-fib was in the setting of a small bowel obstruction   Is very fatigued.   Was put on Iron tablet by Dr. Felipa Eth. Hb is still low  [place] also increased her Coreg to 25 BID .  BP readings at home have been well controlled.  Has not had any syncope Goes for a walk every day and is fatigued when she is done with the walk.   No CP .  Has occasional chest tightness .  Same symptoms back in April - [name] was normal in April, 2017   Feb. 16, 2018:  Rachel Park is seen today . Has had surgery to repair 4 hernias.   Cant get her energy  Its only been a month - I reminded  her that it takes time to heal after surgery .  No CP or dyspnea.   Sept. 24, 2018:  Rachel Park is seen today  Had some CP in July Repeat cath shows no obstructive CAD ,  LAD stent was widely patent  Has had some bursitis of left hip and knee.  No CP .   Thought to have reflux.   Tried Zantac, caused diarrhea, did not continue the zantac.   She has tried Crestor 10 mg in the past. But we had to decrease the dose to 5 g because of muscle aches. She thinks that she might be eating little bit more than she should.   May 23, 2017:  Doing well.  Has been working with the pharmacist at Dr. Sherryll Burger office  They are concerned about her HR in the 82s .  Has not had any syncope.   Has profound lack of energy - several hours after taking the coreg   Sept. 17, 2019: Rachel Park is seen for follow up of her CAD and PAF HR is typically low. Has not had any further  episodes of presyncope since we reduced the dose of Coreg. Still eats a bit more salt than she should   September 24, 2018  Rachel Park was admitted with ischemia bowel on Jul 23, 2018.   She had a prolonged ICU stay and developed AF with RVR.   She had a prolonged hospitalization with slow recovery.  She was started on Eliquis at the time of discharge.   She diuresed well   She was seen by Cecilie Kicks, NP a month ago .  Seemed to be doing well,  Was started back on amiodarone. Her torsemide has been changed to PRN  Breathing is better.  Eating well.   Abdomen is better.   Aug. 19, 2020   Rachel Park is seen today . She was seen a month ago and was set up for a cardioversion.  She had been started on amiodarone and was in NSR when she showed up for her cardioverison She reported some tachycardia and generalized weakness  recently. She was thought to have developed recurrent AF and her amiodarone was increased to 200 mg BID .   Sept. 10, 2020  Rachel Park is seen for follow up of her atrial flutter. I saw her several weeks ago and found her to be  in atrial flutter.   We doubled her amiodarone and had her return today for follow up  Nov. 11 , 2020  Rachel Park is seen today for follow up of atrial flutter  Ankles have been swollen for the past month She had run out of her lasix that she had received from the hospital  She takes lasix 20 mg as needed - has not needed it for the past 3 weeks.   Jan. 12, 2021  Rachel Park is seen today for follow up of her CAD and atrial fib She has persistent AF.   We stopped her amiodarone several months ago and have gone with a rate control and anticoagulation strategy Has had more fatigue recently .    June 16, 2019:  Rachel Park is seen today for follow-up visit.  She has atrial fibrillation.  We have adopted a rate control and anticoagulation strategy.  Blood pressure and heart rate look good. Doesn't have much energy.  No real muscle weakness.  She walks every am for 40 minutes  Brought her HR and BP log.   Overall her HR and BP have been well controled.    Past Medical History:  Diagnosis Date  . Arthritis   . Complication of anesthesia   . Coronary artery disease   . Hypertension   . Hypothyroidism   . PAF (paroxysmal atrial fibrillation) ([place])    on Xarelto for maybe a year, opted to stop  . PONV (postoperative nausea and vomiting)   . Renal infarction ([place])   . Small bowel obstruction ([place]) 06/2015  . Stroke [facility])    oct 2016  . TIA (transient ischemic attack) 03/24/2015  . Vertigo, benign positional    to be evaluated, intermittent    Past Surgical History:  Procedure Laterality Date  . ABDOMINAL HYSTERECTOMY    . APPLICATION OF WOUND VAC N/A 07/23/2018   Procedure: APPLICATION OF WOUND VAC;  Surgeon: Rolm Bookbinder, MD;  Location: [place];  Service: General;  Laterality: N/A;  . BOWEL RESECTION N/A 07/23/2018   Procedure: SMALL BOWEL RESECTION;  Surgeon: Rolm Bookbinder, MD;  Location: [place];  Service: General;  Laterality: N/A;  . CARDIAC CATHETERIZATION N/A 12/24/2014    Procedure: Left Heart Cath and Coronary  Angiography;  Surgeon: Sherren Mocha, MD;  Location: [place] CV LAB;  Service: Cardiovascular;  Laterality: N/A;  . CARDIOVERSION N/A 05/22/2018   Procedure: CARDIOVERSION;  Surgeon: Josue Hector, MD;  Location: [facility] ENDOSCOPY;  Service: Cardiovascular;  Laterality: N/A;  . CATARACT EXTRACTION Bilateral   . COLONOSCOPY WITH PROPOFOL N/A 12/01/2013   Procedure: COLONOSCOPY WITH PROPOFOL;  Surgeon: Garlan Fair, MD;  Location: [facility] ENDOSCOPY;  Service: Endoscopy;  Laterality: N/A;  . COMPLEX WOUND CLOSURE  07/25/2018   Procedure: Complex Wound Closure;  Surgeon: Rolm Bookbinder, MD;  Location: [place];  Service: General;;  . Pennie Rushing  07/25/2018   Procedure: Ileocecetomy;  Surgeon: Rolm Bookbinder, MD;  Location: [place];  Service: General;;  . INCISIONAL HERNIA REPAIR N/A 03/10/2016   Procedure: INCISIONAL HERNIA REPAIR TIMES TWO;  Surgeon: Armandina Gemma, MD;  Location: [place];  Service: General;  Laterality: N/A;  . INGUINAL HERNIA REPAIR Bilateral 03/10/2016   Procedure: BILATERAL INGUINAL HERNIA REPAIRS;  Surgeon: Armandina Gemma, MD;  Location: [place];  Service: General;  Laterality: Bilateral;  . INSERTION OF MESH N/A 03/10/2016   Procedure: INSERTION OF MESH TO BILATERAL GROINS AND ABDOMEN;  Surgeon: Armandina Gemma, MD;  Location: [place];  Service: General;  Laterality: N/A;  . laparotomy  06/2015  . LAPAROTOMY N/A 07/23/2018   Procedure: EXPLORATORY LAPAROTOMY;  Surgeon: Rolm Bookbinder, MD;  Location: [place];  Service: General;  Laterality: N/A;  . LEFT HEART CATH AND CORONARY ANGIOGRAPHY N/A 09/27/2016   Procedure: Left Heart Cath and Coronary Angiography;  Surgeon: Sherren Mocha, MD;  Location: [place] CV LAB;  Service: Cardiovascular;  Laterality: N/A;  . TONSILLECTOMY       Current Outpatient Medications  Medication Sig Dispense Refill  . acetaminophen (TYLENOL) 500 MG tablet Take 1,000 mg by mouth every 6 (six) hours as needed for mild  pain.     Marland Kitchen apixaban (ELIQUIS) 5 MG TABS tablet Take 1 tablet (5 mg total) by mouth 2 (two) times daily. 180 tablet 3  . Calcium Carb-Cholecalciferol (CALCIUM + D3 PO) Take 1 tablet by mouth daily at 12 noon.    . Cholecalciferol (VITAMIN D3) 50 MCG (2000 UT) TABS Take 2,000 Units by mouth daily with lunch.    . diltiazem (CARDIZEM CD) 180 MG 24 hr capsule Take 1 capsule by mouth once daily 90 capsule 3  . levothyroxine (SYNTHROID, LEVOTHROID) 75 MCG tablet Take 75 mcg by mouth daily before breakfast.    . losartan (COZAAR) 50 MG tablet Take 1 tablet (50 mg total) by mouth daily. 90 tablet 3  . mirabegron ER (MYRBETRIQ) 25 MG TB24 tablet Take 25 mg by mouth daily.    . Multiple Vitamin (MULTIVITAMIN WITH MINERALS) TABS tablet Take 1 tablet by mouth daily.    Marland Kitchen NITROSTAT 0.4 MG SL tablet Place 1 tablet (0.4 mg total) under the tongue every 5 (five) minutes as needed. Chest pain 25 tablet 6  . Polyethyl Glycol-Propyl Glycol (SYSTANE ULTRA) 0.4-0.3 % SOLN Place 1-2 drops into both eyes 3 (three) times daily as needed (dry eyes).    . rosuvastatin (CRESTOR) 10 MG tablet Take 1 tablet by mouth once daily 90 tablet 3   No current facility-administered medications for this visit.    Allergies:   Propofol    Social History:  The patient  reports that she has never smoked. She has never used smokeless tobacco. She reports previous alcohol use. She reports that she does not use drugs.   Family  History:  The patient's family history includes Cancer in her sister; Heart attack (age of onset: 18) in her father; Stroke in her mother.    ROS:      Physical Exam: Blood pressure 118/70, pulse 86, height 5\' 2"  (1.575 m), weight 127 lb 6.4 oz (57.8 kg), SpO2 98 %.  GEN:  Well nourished, well developed in no acute distress HEENT: Normal NECK: No JVD; No carotid bruits LYMPHATICS: No lymphadenopathy CARDIAC: irreg. Irreg.   RESPIRATORY:  Clear to auscultation without rales, wheezing or rhonchi    ABDOMEN: Soft, non-tender, non-distended MUSCULOSKELETAL:  No edema; No deformity  SKIN: Warm and dry NEUROLOGIC:  Alert and oriented x 3   EKG:       Recent Labs: 08/12/2018: Magnesium 1.7 11/25/2018: ALT 21; BUN 21; Creatinine, Ser 0.68; Hemoglobin 11.5; Platelets 337; Potassium 5.0; Sodium 134    Lipid Panel    Component Value Date/Time   CHOL 118 11/25/2018 0953   TRIG 94 11/25/2018 0953   HDL 71 11/25/2018 0953   CHOLHDL 1.7 11/25/2018 0953   CHOLHDL 2.3 06/30/2015 0848   VLDL 14 06/30/2015 0848   LDLCALC 29 11/25/2018 0953      Wt Readings from Last 3 Encounters:  06/16/19 127 lb 6.4 oz (57.8 kg)  03/18/19 132 lb 12.8 oz (60.2 kg)  01/15/19 133 lb 6.4 oz (60.5 kg)      Other studies Reviewed: Additional studies/ records that were reviewed today include: . Review of the above records demonstrates:    ASSESSMENT AND PLAN:  1.  CAD -    No angina,  Cont current meds.    2. Persistent  atrial fib/ atrial flutter  - CHADS2VASC schore of 4-6  ( female, age > 84, vascular disease,   TIA and a CVA ( as a complication to her cath)   .  Her blood pressure and heart rate log suggest that her heart rate and blood pressure are very well controlled.  She is a little tachycardic today but this may be because she is slightly dehydrated from being n.p.o. We tried her on beta-blockers including carvedilol and I think metoprolol but these seem to cause more fatigue.  We will continue the current dose of diltiazem.  Continue Eliquis for anticoagulation.    3. Hyperlipidemia:    Continue current meds.  Will check labs today.  4.  Sinus bradycardia.:    5.  Hypertension:: Blood pressures well controlled.  Current medicines are reviewed at length with the patient today.  The patient does not have concerns regarding medicines.  The following changes have been made:  no change  Labs/ tests ordered today include:   No orders of the defined types were placed in this  encounter.   Disposition:     Mertie Moores, MD  06/16/2019 10:50 AM    [address], [place], [place]  [address] Phone: [phone]; Fax: [phone]

## 2019-08-11 ENCOUNTER — Ambulatory Visit: Payer: Medicare HMO | Admitting: Cardiovascular Disease

## 2019-08-11 ENCOUNTER — Encounter: Payer: Self-pay | Admitting: Cardiovascular Disease

## 2019-08-11 ENCOUNTER — Other Ambulatory Visit: Payer: Self-pay

## 2019-08-11 ENCOUNTER — Telehealth: Payer: Self-pay | Admitting: Cardiovascular Disease

## 2019-08-11 VITALS — BP 94/68 | HR 145 | Ht 62.0 in | Wt 126.5 lb

## 2019-08-11 DIAGNOSIS — I4819 Other persistent atrial fibrillation: Secondary | ICD-10-CM | POA: Diagnosis not present

## 2019-08-11 DIAGNOSIS — I25119 Atherosclerotic heart disease of native coronary artery with unspecified angina pectoris: Secondary | ICD-10-CM | POA: Diagnosis not present

## 2019-08-11 MED ORDER — METOPROLOL TARTRATE 25 MG PO TABS: 25.0000 mg | ORAL_TABLET | Freq: Two times a day (BID) | ORAL | 3 refills | Status: DC

## 2019-08-11 NOTE — Telephone Encounter (Signed)
STAT if HR is under 50 or over 120 (normal HR is 60-100 beats per minute)  1) What is your heart rate? 2:30 AM 140 8:00 AM 143   2) Do you have a log of your heart rate readings (document readings)? Hasn't been under 100 since 08/08/19 when it was 93   3) Do you have any other symptoms? SOB & not sleeping well   Rachel Park is calling to report her HR readings as previously advised. She states her HR has not been below 100 since 08/08/19 and that she has been experiencing symptoms of SOB and not sleeping well. This morning around 2:30 AM her HR was 140 and then later at 8:00 AM it was 143. She states she has not been feeling another symptoms when it occurs, but just wanted to make [facility] and Dr. Acie Fredrickson aware. Please advise.

## 2019-08-11 NOTE — Telephone Encounter (Signed)
We will work her into the schedule this am

## 2019-08-11 NOTE — Telephone Encounter (Signed)
Received call directly from operator from patient who reports that she has had elevated HR > 100 bpm since Friday. States she has not felt well but was not alarmed or felt like she had to seek urgent care. SBP readings have been mostly < 100 mmHg but she denies dizziness or lightheadedness. Reports just feeling "off."  Current BP is 95-67 mmHg and HR is 143 bpm. She has not taken her morning medications yet. I advised her to hold losartan and take diltiazem 160 mg. She has taken metoprolol and amiodarone in the past for her persistent a fib. HR at last ov was 62 bpm and patient was advised not to restart metoprolol. She has remained off amiodarone due to risk of toxicity.  I advised I will discuss with Dr. Acie Fredrickson and call her back with additional advice. She verbalized understanding and agreement and thanked me for taking her call.

## 2019-08-11 NOTE — Progress Notes (Signed)
Cardiology Office Note   Date:  08/11/2019   ID:  Rachel Park, DOB 12-04-1931, MRN [id number]  PCP:  Lajean Manes, MD  Cardiologist:   Mertie Moores, MD   Chief Complaint  Patient presents with  . Atrial Fibrillation  . Coronary Artery Disease    problem list 1. Coronary artery  disease-status post stenting of her proximal LAD 2.  CVA- occurred during the stenting 3.  Atrial Fibrillation    Previous notes  Rachel Park is a 86 y.o. female who presents for follow up of her stenting She has done well.  Is a bit anxious - may account for her elevated BP today .  Memory has improved ,  No residual CVA symptoms.   Feb. 1, 2017: Doing well Has some fatigue. Has some bruising on lower logs  -  On Plavix   June 21, 2015:  Rachel Park was diagnosed with atrial fib, was started on eliquis 5 mg BID.  She was in [place] with her sister. Developed abdominal pain .   Severe hypoxemia .   In great distress. Was found to have a small bowell perforation .   Was in the ICU for 2 days.   Developed hypotension , was started on levophed,   Developed atrial fib  plavix was stopped,   Heparin was started.   Has converted to NSR at this time  Has had some band like chest pain since that time Has shortness of breath - at rest and with exertion .     September 22, 2015:  Rachel Park is doing well.  We DC'd her Eliquis - due to bleeding.   She is on Plavix  Her only episode of a-fib was in the setting of a small bowel obstruction   Is very fatigued.   Was put on Iron tablet by Dr. Felipa Eth. Hb is still low  [place] also increased her Coreg to 25 BID .  BP readings at home have been well controlled.  Has not had any syncope Goes for a walk every day and is fatigued when she is done with the walk.   No CP .  Has occasional chest tightness .  Same symptoms back in April - [name] was normal in April, 2017   Feb. 16, 2018:  Rachel Park is seen today . Has had surgery to repair 4  hernias.   Cant get her energy  Its only been a month - I reminded her that it takes time to heal after surgery .  No CP or dyspnea.   Sept. 24, 2018:  Rachel Park is seen today  Had some CP in July Repeat cath shows no obstructive CAD ,  LAD stent was widely patent  Has had some bursitis of left hip and knee.  No CP .   Thought to have reflux.   Tried Zantac, caused diarrhea, did not continue the zantac.   She has tried Crestor 10 mg in the past. But we had to decrease the dose to 5 g because of muscle aches. She thinks that she might be eating little bit more than she should.   May 23, 2017:  Doing well.  Has been working with the pharmacist at Dr. Sherryll Burger office  They are concerned about her HR in the 56s .  Has not had any syncope.   Has profound lack of energy - several hours after taking the coreg   Sept. 17, 2019: Rachel Park is seen for follow up of her CAD  and PAF HR is typically low. Has not had any further episodes of presyncope since we reduced the dose of Coreg. Still eats a bit more salt than she should   September 24, 2018  Rachel Park was admitted with ischemia bowel on Jul 23, 2018.   She had a prolonged ICU stay and developed AF with RVR.   She had a prolonged hospitalization with slow recovery.  She was started on Eliquis at the time of discharge.   She diuresed well   She was seen by Cecilie Kicks, NP a month ago .  Seemed to be doing well,  Was started back on amiodarone. Her torsemide has been changed to PRN  Breathing is better.  Eating well.   Abdomen is better.   Aug. 19, 2020   Rachel Park is seen today . She was seen a month ago and was set up for a cardioversion.  She had been started on amiodarone and was in NSR when she showed up for her cardioverison She reported some tachycardia and generalized weakness  recently. She was thought to have developed recurrent AF and her amiodarone was increased to 200 mg BID .   Sept. 10, 2020  Rachel Park is seen for follow up of  her atrial flutter. I saw her several weeks ago and found her to be in atrial flutter.   We doubled her amiodarone and had her return today for follow up  Nov. 11 , 2020  Rachel Park is seen today for follow up of atrial flutter  Ankles have been swollen for the past month She had run out of her lasix that she had received from the hospital  She takes lasix 20 mg as needed - has not needed it for the past 3 weeks.   Jan. 12, 2021  Rachel Park is seen today for follow up of her CAD and atrial fib She has persistent AF.   We stopped her amiodarone several months ago and have gone with a rate control and anticoagulation strategy Has had more fatigue recently .    June 16, 2019:  Rachel Park is seen today for follow-up visit.  She has atrial fibrillation.  We have adopted a rate control and anticoagulation strategy.  Blood pressure and heart rate look good. Doesn't have much energy.  No real muscle weakness.  She walks every am for 40 minutes  Brought her HR and BP log.   Overall her HR and BP have been well controled.   August 11, 2019:  Rachel Park is seen today for a work in visit because of persistent rapid atrial fibrillation.  Her blood pressure is also  Low.  We had stopped her metoprolol and put her on diltiazem due to fatigue.  She has been on diltiazem but her heart rate has been not as well controlled..  Her fatigue is no different even off the metoprolol  She has no signs or symptoms of any underlying infection.  She is eating and drinking well.    Past Medical History:  Diagnosis Date  . Arthritis   . Complication of anesthesia   . Coronary artery disease   . Hypertension   . Hypothyroidism   . PAF (paroxysmal atrial fibrillation) ([place])    on Xarelto for maybe a year, opted to stop  . PONV (postoperative nausea and vomiting)   . Renal infarction ([place])   . Small bowel obstruction ([place]) 06/2015  . Stroke [facility])    oct 2016  . TIA (transient ischemic attack) 03/24/2015  .  Vertigo, benign  positional    to be evaluated, intermittent    Past Surgical History:  Procedure Laterality Date  . ABDOMINAL HYSTERECTOMY    . APPLICATION OF WOUND VAC N/A 07/23/2018   Procedure: APPLICATION OF WOUND VAC;  Surgeon: Rolm Bookbinder, MD;  Location: [facility];  Service: General;  Laterality: N/A;  . BOWEL RESECTION N/A 07/23/2018   Procedure: SMALL BOWEL RESECTION;  Surgeon: Rolm Bookbinder, MD;  Location: [facility];  Service: General;  Laterality: N/A;  . CARDIAC CATHETERIZATION N/A 12/24/2014   Procedure: Left Heart Cath and Coronary Angiography;  Surgeon: Sherren Mocha, MD;  Location: [facility];  Service: Cardiovascular;  Laterality: N/A;  . CARDIOVERSION N/A 05/22/2018   Procedure: CARDIOVERSION;  Surgeon: Josue Hector, MD;  Location: [facility] ENDOSCOPY;  Service: Cardiovascular;  Laterality: N/A;  . CATARACT EXTRACTION Bilateral   . COLONOSCOPY WITH PROPOFOL N/A 12/01/2013   Procedure: COLONOSCOPY WITH PROPOFOL;  Surgeon: Garlan Fair, MD;  Location: [facility] ENDOSCOPY;  Service: Endoscopy;  Laterality: N/A;  . COMPLEX WOUND CLOSURE  07/25/2018   Procedure: Complex Wound Closure;  Surgeon: Rolm Bookbinder, MD;  Location: [facility];  Service: General;;  . Pennie Rushing  07/25/2018   Procedure: Ileocecetomy;  Surgeon: Rolm Bookbinder, MD;  Location: [facility];  Service: General;;  . INCISIONAL HERNIA REPAIR N/A 03/10/2016   Procedure: INCISIONAL HERNIA REPAIR TIMES TWO;  Surgeon: Armandina Gemma, MD;  Location: [facility];  Service: General;  Laterality: N/A;  . INGUINAL HERNIA REPAIR Bilateral 03/10/2016   Procedure: BILATERAL INGUINAL HERNIA REPAIRS;  Surgeon: Armandina Gemma, MD;  Location: [place];  Service: General;  Laterality: Bilateral;  . INSERTION OF MESH N/A 03/10/2016   Procedure: INSERTION OF MESH TO BILATERAL GROINS AND ABDOMEN;  Surgeon: Armandina Gemma, MD;  Location: [facility];  Service: General;  Laterality: N/A;  . laparotomy  06/2015  . LAPAROTOMY N/A 07/23/2018   Procedure: EXPLORATORY LAPAROTOMY;   Surgeon: Rolm Bookbinder, MD;  Location: [facility];  Service: General;  Laterality: N/A;  . LEFT HEART CATH AND CORONARY ANGIOGRAPHY N/A 09/27/2016   Procedure: Left Heart Cath and Coronary Angiography;  Surgeon: Sherren Mocha, MD;  Location: [facility];  Service: Cardiovascular;  Laterality: N/A;  . TONSILLECTOMY       Current Outpatient Medications  Medication Sig Dispense Refill  . acetaminophen (TYLENOL) 500 MG tablet Take 1,000 mg by mouth every 6 (six) hours as needed for mild pain.     Marland Kitchen apixaban (ELIQUIS) 5 MG TABS tablet Take 1 tablet (5 mg total) by mouth 2 (two) times daily. 180 tablet 3  . Calcium Carb-Cholecalciferol (CALCIUM + D3 PO) Take 1 tablet by mouth daily at 12 noon.    . Cholecalciferol (VITAMIN D3) 50 MCG (2000 UT) TABS Take 2,000 Units by mouth daily with lunch.    . levothyroxine (SYNTHROID, LEVOTHROID) 75 MCG tablet Take 75 mcg by mouth daily before breakfast.    . Multiple Vitamin (MULTIVITAMIN WITH MINERALS) TABS tablet Take 1 tablet by mouth daily.    Marland Kitchen NITROSTAT 0.4 MG SL tablet Place 1 tablet (0.4 mg total) under the tongue every 5 (five) minutes as needed. Chest pain 25 tablet 6  . Polyethyl Glycol-Propyl Glycol (SYSTANE ULTRA) 0.4-0.3 % SOLN Place 1-2 drops into both eyes 3 (three) times daily as needed (dry eyes).    . rosuvastatin (CRESTOR) 10 MG tablet Take 1 tablet by mouth once daily 90 tablet 3  . metoprolol tartrate (LOPRESSOR) 25 MG tablet Take 1 tablet (  25 mg total) by mouth 2 (two) times daily. 180 tablet 3   No current facility-administered medications for this visit.    Allergies:   Propofol    Social History:  The patient  reports that she has never smoked. She has never used smokeless tobacco. She reports previous alcohol use. She reports that she does not use drugs.   Family History:  The patient's family history includes Cancer in her sister; Heart attack (age of onset: 7) in her father; Stroke in her mother.    ROS:        Physical Exam: Blood pressure 94/68, pulse (!) 145, height 5\' 2"  (1.575 m), weight 126 lb 8 oz (57.4 kg), SpO2 98 %.  GEN:  Elderly female,  NAD  HEENT: Normal NECK: No JVD; No carotid bruits LYMPHATICS: No lymphadenopathy CARDIAC: irreg. Irreg.  RESPIRATORY:  Clear to auscultation without rales, wheezing or rhonchi  ABDOMEN: Soft, non-tender, non-distended MUSCULOSKELETAL:  No edema; No deformity  SKIN: Warm and dry NEUROLOGIC:  Alert and oriented x 3   EKG:    August 11, 2019: Atrial fibrillation with a heart rate of 146.  No ST or T wave changes.   Recent Labs: 08/12/2018: Magnesium 1.7 11/25/2018: Hemoglobin 11.5; Platelets 337 06/16/2019: ALT 14; BUN 11; Creatinine, Ser 0.67; Potassium 5.2; Sodium 137    Lipid Panel    Component Value Date/Time   CHOL 124 06/16/2019 1105   TRIG 76 06/16/2019 1105   HDL 72 06/16/2019 1105   CHOLHDL 1.7 06/16/2019 1105   CHOLHDL 2.3 06/30/2015 0848   VLDL 14 06/30/2015 0848   LDLCALC 37 06/16/2019 1105      Wt Readings from Last 3 Encounters:  08/11/19 126 lb 8 oz (57.4 kg)  06/16/19 127 lb 6.4 oz (57.8 kg)  03/18/19 132 lb 12.8 oz (60.2 kg)      Other studies Reviewed: Additional studies/ records that were reviewed today include: . Review of the above records demonstrates:    ASSESSMENT AND PLAN:  1.  CAD -      she denies any angina.  2. Persistent  atrial fib/ atrial flutter  - CHADS2VASC schore of 4-6  ( female, age > 49, vascular disease,   TIA and a CVA ( as a complication to her cath)   .  Her heart rate is rapid today.  Several months ago we had stopped the metoprolol because she thought it was contributing to her her fatigue.  We have had her on diltiazem but she has stated that the fatigue is no better.  We will discontinue the diltiazem and start her back on metoprolol 25 mg twice a day.  Her blood pressure is a little low and will also be stopping the losartan.  We will call her in several days to see what her  heart rate and blood pressure are.  I will plan on seeing her back in approximately 3 weeks for follow-up office visit.    3. Hyperlipidemia: Continue current meds for now.  5.  Hypertension:: Blood pressures fairly well controlled.  Current medicines are reviewed at length with the patient today.  The patient does not have concerns regarding medicines.  The following changes have been made:  no change  Labs/ tests ordered today include:   Orders Placed This Encounter  Procedures  . EKG 12-Lead    Disposition:     Mertie Moores, MD  08/11/2019 4:45 PM    [facility] [place],  , [place]  [address] Phone: [phone]; Fax: [phone]  

## 2019-08-11 NOTE — Patient Instructions (Signed)
Medication Instructions:  Your physician has recommended you make the following change in your medication:  STOP Diltiazem STOP Losartan START Metoprolol tartrate 25 mg twice daily  *If you need a refill on your cardiac medications before your next appointment, please call your pharmacy*   Lab Work: None Ordered If you have labs (blood work) drawn today and your tests are completely normal, you will receive your results only by: Marland Kitchen MyChart Message (if you have MyChart) OR . A paper copy in the mail If you have any lab test that is abnormal or we need to change your treatment, we will call you to review the results.   Testing/Procedures: None Ordered   Follow-Up: At [facility], you and your health needs are our priority.  As part of our continuing mission to provide you with exceptional heart care, we have created designated Provider Care Teams.  These Care Teams include your primary Cardiologist (physician) and Advanced Practice Providers (APPs -  Physician Assistants and Nurse Practitioners) who all work together to provide you with the care you need, when you need it.   Your next appointment:   3 week(s) on Tuesday June 29 at 9:20 am  The format for your next appointment:   In Person  Provider:   Mertie Moores, MD

## 2019-08-17 ENCOUNTER — Telehealth: Payer: Self-pay | Admitting: Cardiovascular Disease

## 2019-08-17 NOTE — Telephone Encounter (Signed)
Phone call from Curt Jews ( Darbi's son) Shontavia has been having more shortness of breath recently  PND, orthopnea No visible ankle edema Slight cough No fever or sputum  Does not look like severe CHF  Will try Lasix 20 mg a day for the next 2-3 days and Vandy will call Sharyn Lull at the office and let her know how she is feeling.  K has been high normal ,  I dont think she needs Kdur at this time  Will reasess later this week    Mertie Moores, MD  08/17/2019 8:46 AM    [address],  [place], [place]  [address] Phone: [phone]; Fax: [phone]

## 2019-08-18 NOTE — Telephone Encounter (Signed)
Left message for patient to call back to assess how she is feeling today

## 2019-08-18 NOTE — Telephone Encounter (Signed)
Patient returned my call. Reports she is feeling better than yesterday. She took lasix yesterday and just took a 2nd dose because she went to the grocery store this morning. States she was SOB while grocery shopping but did fine with the assistance of the cart. States tightness below her breasts that was present after heart stent 2018 has returned. She states she slept better last night. Admits she had increased her sodium content in food because of low blood pressure and she has gained some abdominal weight, also has hx of abdominal adhesions and hernia. She has reduced sodium intake. States she does not feel like she needs to come into the office today, would like to continue to monitor. I advised her that I will be out of the office tomorrow and Wednesday but that she can call the office and speak with one of the triage nurses. I moved her follow-up appointment with Dr. Acie Fredrickson to 6/21 and advised her to call back with questions or concerns. She verbalized understanding and agreement with plan and thanked me for the call.

## 2019-08-18 NOTE — Telephone Encounter (Signed)
Follow Up  Patient returning call. Transferred to [place].

## 2019-08-21 ENCOUNTER — Inpatient Hospital Stay (HOSPITAL_COMMUNITY)
Admission: AD | Admit: 2019-08-21 | Discharge: 2019-08-25 | DRG: 309 | Disposition: A | Discharge status: Home / Self Care | Payer: Medicare HMO | Attending: Cardiology | Admitting: Cardiology

## 2019-08-21 ENCOUNTER — Telehealth: Payer: Self-pay | Admitting: Cardiovascular Disease

## 2019-08-21 ENCOUNTER — Emergency Department (HOSPITAL_COMMUNITY): Payer: Medicare HMO

## 2019-08-21 ENCOUNTER — Other Ambulatory Visit: Payer: Self-pay

## 2019-08-21 DIAGNOSIS — Z7901 Long term (current) use of anticoagulants: Secondary | ICD-10-CM

## 2019-08-21 DIAGNOSIS — I4892 Unspecified atrial flutter: Secondary | ICD-10-CM | POA: Diagnosis present

## 2019-08-21 DIAGNOSIS — I4819 Other persistent atrial fibrillation: Secondary | ICD-10-CM | POA: Diagnosis not present

## 2019-08-21 DIAGNOSIS — I480 Paroxysmal atrial fibrillation: Secondary | ICD-10-CM | POA: Diagnosis not present

## 2019-08-21 DIAGNOSIS — Z884 Allergy status to anesthetic agent status: Secondary | ICD-10-CM

## 2019-08-21 DIAGNOSIS — I5022 Chronic systolic (congestive) heart failure: Secondary | ICD-10-CM | POA: Insufficient documentation

## 2019-08-21 DIAGNOSIS — Z79890 Hormone replacement therapy (postmenopausal): Secondary | ICD-10-CM

## 2019-08-21 DIAGNOSIS — Z86718 Personal history of other venous thrombosis and embolism: Secondary | ICD-10-CM

## 2019-08-21 DIAGNOSIS — Z79899 Other long term (current) drug therapy: Secondary | ICD-10-CM

## 2019-08-21 DIAGNOSIS — Z955 Presence of coronary angioplasty implant and graft: Secondary | ICD-10-CM

## 2019-08-21 DIAGNOSIS — E039 Hypothyroidism, unspecified: Secondary | ICD-10-CM | POA: Diagnosis present

## 2019-08-21 DIAGNOSIS — I429 Cardiomyopathy, unspecified: Secondary | ICD-10-CM | POA: Diagnosis present

## 2019-08-21 DIAGNOSIS — E785 Hyperlipidemia, unspecified: Secondary | ICD-10-CM | POA: Diagnosis present

## 2019-08-21 DIAGNOSIS — I110 Hypertensive heart disease with heart failure: Secondary | ICD-10-CM | POA: Diagnosis present

## 2019-08-21 DIAGNOSIS — I509 Heart failure, unspecified: Secondary | ICD-10-CM | POA: Diagnosis not present

## 2019-08-21 DIAGNOSIS — M1990 Unspecified osteoarthritis, unspecified site: Secondary | ICD-10-CM | POA: Diagnosis present

## 2019-08-21 DIAGNOSIS — I2510 Atherosclerotic heart disease of native coronary artery without angina pectoris: Secondary | ICD-10-CM | POA: Diagnosis present

## 2019-08-21 DIAGNOSIS — R0602 Shortness of breath: Secondary | ICD-10-CM | POA: Diagnosis not present

## 2019-08-21 DIAGNOSIS — Z20822 Contact with and (suspected) exposure to covid-19: Secondary | ICD-10-CM | POA: Diagnosis not present

## 2019-08-21 DIAGNOSIS — Z8673 Personal history of transient ischemic attack (TIA), and cerebral infarction without residual deficits: Secondary | ICD-10-CM

## 2019-08-21 DIAGNOSIS — Z823 Family history of stroke: Secondary | ICD-10-CM

## 2019-08-21 DIAGNOSIS — Z8249 Family history of ischemic heart disease and other diseases of the circulatory system: Secondary | ICD-10-CM

## 2019-08-21 DIAGNOSIS — J90 Pleural effusion, not elsewhere classified: Secondary | ICD-10-CM | POA: Diagnosis not present

## 2019-08-21 DIAGNOSIS — J9811 Atelectasis: Secondary | ICD-10-CM | POA: Diagnosis not present

## 2019-08-21 DIAGNOSIS — R05 Cough: Secondary | ICD-10-CM | POA: Diagnosis not present

## 2019-08-21 DIAGNOSIS — I484 Atypical atrial flutter: Secondary | ICD-10-CM

## 2019-08-21 DIAGNOSIS — I5031 Acute diastolic (congestive) heart failure: Secondary | ICD-10-CM | POA: Diagnosis not present

## 2019-08-21 LAB — CBC
HCT: 35.7 % — ABNORMAL LOW (ref 36.0–46.0)
Hemoglobin: 11.6 g/dL — ABNORMAL LOW (ref 12.0–15.0)
MCH: 31 pg (ref 26.0–34.0)
MCHC: 32.5 g/dL (ref 30.0–36.0)
MCV: 95.5 fL (ref 80.0–100.0)
Platelets: 260 10*3/uL (ref 150–400)
RBC: 3.74 MIL/uL — ABNORMAL LOW (ref 3.87–5.11)
RDW: 13.8 % (ref 11.5–15.5)
WBC: 6.9 10*3/uL (ref 4.0–10.5)
nRBC: 0 % (ref 0.0–0.2)

## 2019-08-21 LAB — BASIC METABOLIC PANEL
Anion gap: 12 (ref 5–15)
BUN: 11 mg/dL (ref 8–23)
CO2: 18 mmol/L — ABNORMAL LOW (ref 22–32)
Calcium: 8.7 mg/dL — ABNORMAL LOW (ref 8.9–10.3)
Chloride: 106 mmol/L (ref 98–111)
Creatinine, Ser: 0.68 mg/dL (ref 0.44–1.00)
GFR calc Af Amer: >60 mL/min (ref >60)
GFR calc non Af Amer: >60 mL/min (ref >60)
Glucose, Bld: 104 mg/dL — ABNORMAL HIGH (ref 70–99)
Potassium: 3.9 mmol/L (ref 3.5–5.1)
Sodium: 136 mmol/L (ref 135–145)

## 2019-08-21 LAB — TROPONIN I (HIGH SENSITIVITY)
Troponin I (High Sensitivity): 6 ng/L (ref <18)
Troponin I (High Sensitivity): 7 ng/L (ref <18)

## 2019-08-21 LAB — TSH: TSH: 11.653 u[IU]/mL — ABNORMAL HIGH (ref 0.350–4.500)

## 2019-08-21 LAB — PROTIME-INR
INR: 1.6 — ABNORMAL HIGH (ref 0.8–1.2)
Prothrombin Time: 18.7 seconds — ABNORMAL HIGH (ref 11.4–15.2)

## 2019-08-21 LAB — SARS CORONAVIRUS 2 BY RT PCR (HOSPITAL ORDER, PERFORMED IN ~~LOC~~ HOSPITAL LAB): SARS Coronavirus 2: NEGATIVE

## 2019-08-21 LAB — MAGNESIUM
Magnesium: 1.6 mg/dL — ABNORMAL LOW (ref 1.7–2.4)
Magnesium: 1.7 mg/dL (ref 1.7–2.4)

## 2019-08-21 LAB — APTT: aPTT: 27 seconds (ref 24–36)

## 2019-08-21 MED ORDER — ACETAMINOPHEN 325 MG PO TABS: 650.0000 mg | ORAL_TABLET | ORAL | Status: DC | PRN

## 2019-08-21 MED ORDER — LEVOTHYROXINE SODIUM 75 MCG PO TABS
75.0000 ug | ORAL_TABLET | Freq: Every day | ORAL | Status: DC
  Administered 2019-08-22 – 2019-08-25 (×4): 75 ug via ORAL
  Filled 2019-08-21 (×4): qty 1

## 2019-08-21 MED ORDER — FUROSEMIDE 10 MG/ML IJ SOLN
40.0000 mg | Freq: Two times a day (BID) | INTRAMUSCULAR | Status: AC
  Administered 2019-08-21 – 2019-08-22 (×2): 40 mg via INTRAVENOUS
  Filled 2019-08-21 (×2): qty 4

## 2019-08-21 MED ORDER — METOPROLOL TARTRATE 25 MG PO TABS
25.0000 mg | ORAL_TABLET | Freq: Four times a day (QID) | ORAL | Status: DC
  Administered 2019-08-21 – 2019-08-22 (×3): 25 mg via ORAL
  Filled 2019-08-21 (×4): qty 1

## 2019-08-21 MED ORDER — APIXABAN 5 MG PO TABS: 5.0000 mg | ORAL_TABLET | Freq: Two times a day (BID) | ORAL | Status: DC

## 2019-08-21 MED ORDER — ONDANSETRON HCL 4 MG/2ML IJ SOLN: 4.0000 mg | Freq: Four times a day (QID) | INTRAMUSCULAR | Status: DC | PRN

## 2019-08-21 MED ORDER — ROSUVASTATIN CALCIUM 5 MG PO TABS
10.0000 mg | ORAL_TABLET | Freq: Every day | ORAL | Status: DC
  Administered 2019-08-21 – 2019-08-25 (×5): 10 mg via ORAL
  Filled 2019-08-21 (×5): qty 2

## 2019-08-21 MED ORDER — APIXABAN 2.5 MG PO TABS
2.5000 mg | ORAL_TABLET | Freq: Two times a day (BID) | ORAL | Status: DC
  Administered 2019-08-21 – 2019-08-25 (×8): 2.5 mg via ORAL
  Filled 2019-08-21 (×9): qty 1

## 2019-08-21 NOTE — Telephone Encounter (Signed)
Spoke with Dr. Acie Fredrickson by telephone who advised that patient should go to ED for further evaluation. He has discussed pt's concerns with her son, who is a MD, and they agree that this is the best course of action. Patient is aware and agrees and has a friend who can drive her. I advised her that I will leave her appointment with Dr. Acie Fredrickson for Monday and will check on her tomorrow to see if we need to change the appointment. She thanked me for the call.

## 2019-08-21 NOTE — Telephone Encounter (Signed)
Pt c/o medication issue:  1. Name of Medication: Lasix  2. How are you currently taking this medication (dosage and times per day)? n/a  3. Are you having a reaction (difficulty breathing--STAT)? no  4. What is your medication issue? Patient states that she does not know if she will be able to make it to her appointment on 08/25/19. She states that she is still not feeling well and is barely sleeping at night. States she may have fell dosed off for 15 minutes twice last night. She wants to know if she should take another dose of lasix. Please advise.

## 2019-08-21 NOTE — ED Provider Notes (Signed)
[facility] EMERGENCY DEPARTMENT Provider Note   CSN: [id number] Arrival date & time: 08/21/19  1044     History Chief Complaint  Patient presents with  . Atrial Fibrillation  . Shortness of Breath    Rachel Park is a 86 y.o. female.  Presenting to ER with concern for rapid heart rate, shortness of breath.  Past medical history CAD, paroxysmal A. fib, atrial flutter managed by cardiology.  Has been having increased shortness of breath, decreased energy over the past few weeks.  Seen in cardiology office recently, heart rate in 140s, switched to metoprolol from Cardizem.  Patient states she does continue to have shortness of breath, dyspnea on exertion.  No associated chest pain.  No leg pain, leg swelling.  No fevers, cough.  [place] cardiology office and was recommended to come to ER.  HPI     Past Medical History:  Diagnosis Date  . Arthritis   . Complication of anesthesia   . Coronary artery disease   . Hypertension   . Hypothyroidism   . PAF (paroxysmal atrial fibrillation) ([place])    on Xarelto for maybe a year, opted to stop  . PONV (postoperative nausea and vomiting)   . Renal infarction ([place])   . Small bowel obstruction ([place]) 06/2015  . Stroke [facility])    oct 2016  . TIA (transient ischemic attack) 03/24/2015  . Vertigo, benign positional    to be evaluated, intermittent    Patient Active Problem List   Diagnosis Date Noted  . Atrial flutter ([place]) 10/23/2018  . Acute deep vein thrombosis (DVT) of upper extremity ([place])   . Ileus ([place])   . SBO (small bowel obstruction) ([place]) 07/23/2018  . Small bowel obstruction ([place]) 07/22/2018  . Atrial fibrillation ([place]) 05/20/2018  . Renal infarct ([place])   . Hyperlipidemia 11/27/2016  . Bilateral inguinal hernia without obstruction or gangrene 03/07/2016  . Incisional hernia, without obstruction or gangrene 03/07/2016  . Persistent atrial fibrillation 06/21/2015  . TIA (transient ischemic attack)  03/24/2015  . CAD (coronary artery disease) 12/26/2014  . COPD (chronic obstructive pulmonary disease) ([place]) 12/26/2014  . Hypothyroidism 12/26/2014  . Cerebral embolism with cerebral infarction 12/25/2014    Past Surgical History:  Procedure Laterality Date  . ABDOMINAL HYSTERECTOMY    . APPLICATION OF WOUND VAC N/A 07/23/2018   Procedure: APPLICATION OF WOUND VAC;  Surgeon: Rolm Bookbinder, MD;  Location: [facility];  Service: General;  Laterality: N/A;  . BOWEL RESECTION N/A 07/23/2018   Procedure: SMALL BOWEL RESECTION;  Surgeon: Rolm Bookbinder, MD;  Location: [place];  Service: General;  Laterality: N/A;  . CARDIAC CATHETERIZATION N/A 12/24/2014   Procedure: Left Heart Cath and Coronary Angiography;  Surgeon: Sherren Mocha, MD;  Location: [facility] CV LAB;  Service: Cardiovascular;  Laterality: N/A;  . CARDIOVERSION N/A 05/22/2018   Procedure: CARDIOVERSION;  Surgeon: Josue Hector, MD;  Location: [facility] ENDOSCOPY;  Service: Cardiovascular;  Laterality: N/A;  . CATARACT EXTRACTION Bilateral   . COLONOSCOPY WITH PROPOFOL N/A 12/01/2013   Procedure: COLONOSCOPY WITH PROPOFOL;  Surgeon: Garlan Fair, MD;  Location: [facility] ENDOSCOPY;  Service: Endoscopy;  Laterality: N/A;  . COMPLEX WOUND CLOSURE  07/25/2018   Procedure: Complex Wound Closure;  Surgeon: Rolm Bookbinder, MD;  Location: [facility];  Service: General;;  . Pennie Rushing  07/25/2018   Procedure: Ileocecetomy;  Surgeon: Rolm Bookbinder, MD;  Location: [facility];  Service: General;;  . INCISIONAL HERNIA REPAIR N/A 03/10/2016   Procedure: INCISIONAL HERNIA  REPAIR TIMES TWO;  Surgeon: Armandina Gemma, MD;  Location: [facility];  Service: General;  Laterality: N/A;  . INGUINAL HERNIA REPAIR Bilateral 03/10/2016   Procedure: BILATERAL INGUINAL HERNIA REPAIRS;  Surgeon: Armandina Gemma, MD;  Location: [place];  Service: General;  Laterality: Bilateral;  . INSERTION OF MESH N/A 03/10/2016   Procedure: INSERTION OF MESH TO BILATERAL GROINS AND ABDOMEN;  Surgeon:  Armandina Gemma, MD;  Location: [facility];  Service: General;  Laterality: N/A;  . laparotomy  06/2015  . LAPAROTOMY N/A 07/23/2018   Procedure: EXPLORATORY LAPAROTOMY;  Surgeon: Rolm Bookbinder, MD;  Location: [place];  Service: General;  Laterality: N/A;  . LEFT HEART CATH AND CORONARY ANGIOGRAPHY N/A 09/27/2016   Procedure: Left Heart Cath and Coronary Angiography;  Surgeon: Sherren Mocha, MD;  Location: [place] CV [facility];  Service: Cardiovascular;  Laterality: N/A;  . TONSILLECTOMY       OB History   No obstetric history on file.     Family History  Problem Relation Age of Onset  . Heart attack Father 13       Died age 28  . Stroke Mother   . Cancer Sister        breast    Social History   Tobacco Use  . Smoking status: Never Smoker  . Smokeless tobacco: Never Used  Vaping Use  . Vaping Use: Never used  Substance Use Topics  . Alcohol use: Not Currently    Comment: occ.  . Drug use: No    Home Medications Prior to Admission medications   Medication Sig Start Date End Date Taking? Authorizing Provider  acetaminophen (TYLENOL) 500 MG tablet Take 1,000 mg by mouth every 6 (six) hours as needed for mild pain.     [provider]  apixaban (ELIQUIS) 5 MG TABS tablet Take 1 tablet (5 mg total) by mouth 2 (two) times daily. 09/24/18   Nahser, Wonda Cheng, MD  Calcium Carb-Cholecalciferol (CALCIUM + D3 PO) Take 1 tablet by mouth daily at 12 noon.    [provider]  Cholecalciferol (VITAMIN D3) 50 MCG (2000 UT) TABS Take 2,000 Units by mouth daily with lunch.    [provider]  levothyroxine (SYNTHROID, LEVOTHROID) 75 MCG tablet Take 75 mcg by mouth daily before breakfast.    [provider]  metoprolol tartrate (LOPRESSOR) 25 MG tablet Take 1 tablet (25 mg total) by mouth 2 (two) times daily. 08/11/19   Nahser, Wonda Cheng, MD  Multiple Vitamin (MULTIVITAMIN WITH MINERALS) TABS tablet Take 1 tablet by mouth daily. 08/10/18   Bonnita Hollow, MD    NITROSTAT 0.4 MG SL tablet Place 1 tablet (0.4 mg total) under the tongue every 5 (five) minutes as needed. Chest pain 06/24/18   Nahser, Wonda Cheng, MD  Polyethyl Glycol-Propyl Glycol (SYSTANE ULTRA) 0.4-0.3 % SOLN Place 1-2 drops into both eyes 3 (three) times daily as needed (dry eyes).    [provider]  rosuvastatin (CRESTOR) 10 MG tablet Take 1 tablet by mouth once daily 01/06/19   Nahser, Wonda Cheng, MD    Allergies    Propofol  Review of Systems   Review of Systems  Constitutional: Negative for chills and fever.  HENT: Negative for ear pain and sore throat.   Eyes: Negative for pain and visual disturbance.  Respiratory: Positive for shortness of breath. Negative for cough.   Cardiovascular: Positive for palpitations. Negative for chest pain.  Gastrointestinal: Negative for abdominal pain and vomiting.  Genitourinary: Negative for  dysuria and hematuria.  Musculoskeletal: Negative for arthralgias and back pain.  Skin: Negative for color change and rash.  Neurological: Negative for seizures and syncope.  All other systems reviewed and are negative.   Physical Exam Updated Vital Signs BP (!) 150/110   Pulse (!) 138   Temp 97.6 F (36.4 C) (Oral)   Resp (!) 22   Ht 5\' 2"  (1.575 m)   Wt 57.2 kg   SpO2 96%   BMI 23.05 kg/m   Physical Exam Vitals and nursing note reviewed.  Constitutional:      General: She is not in acute distress.    Appearance: She is well-developed.  HENT:     Head: Normocephalic and atraumatic.  Eyes:     Conjunctiva/sclera: Conjunctivae normal.  Cardiovascular:     Rate and Rhythm: Tachycardia present. Rhythm irregular.     Heart sounds: No murmur heard.   Pulmonary:     Effort: Pulmonary effort is normal. No respiratory distress.     Breath sounds: Normal breath sounds.  Abdominal:     Palpations: Abdomen is soft.     Tenderness: There is no abdominal tenderness.  Musculoskeletal:     Cervical back: Neck supple.     Right lower  leg: No edema.     Left lower leg: No edema.  Skin:    General: Skin is warm and dry.     Capillary Refill: Capillary refill takes less than 2 seconds.  Neurological:     Mental Status: She is alert.     ED Results / Procedures / Treatments   Labs (all labs ordered are listed, but only abnormal results are displayed) Labs Reviewed  BASIC METABOLIC PANEL - Abnormal; Notable for the following components:      Result Value   CO2 18 (*)    Glucose, Bld 104 (*)    Calcium 8.7 (*)    All other components within normal limits  CBC - Abnormal; Notable for the following components:   RBC 3.74 (*)    Hemoglobin 11.6 (*)    HCT 35.7 (*)    All other components within normal limits  MAGNESIUM  APTT  PROTIME-INR  TROPONIN I (HIGH SENSITIVITY)    EKG EKG Interpretation  Date/Time:  Thursday August 21 2019 10:45:45 EDT Ventricular Rate:  137 PR Interval:    QRS Duration: 68 QT Interval:  332 QTC Calculation: 501 R Axis:   87 Text Interpretation: Atrial flutter with 2:1 A-V conduction Low voltage QRS Nonspecific ST abnormality Abnormal ECG Confirmed by Madalyn Rob [phone]) on 08/21/2019 11:12:52 AM   Radiology DG Chest 2 View  Result Date: 08/21/2019 CLINICAL DATA:  Increased short of breath.  Cough EXAM: CHEST - 2 VIEW COMPARISON:  08/02/2018 FINDINGS: Normal cardiac silhouette. Bilateral small effusions similar comparison exam. Mild basilar atelectasis. No pulmonary edema. No pneumothorax. No focal infiltrate. IMPRESSION: Bilateral small effusions and mild basilar atelectasis are similar to comparison exam. Electronically Signed   By: Suzy Bouchard M.D.   On: 08/21/2019 11:46    Procedures Procedures (including critical care time)  Medications Ordered in ED Medications - No data to display  ED Course  I have reviewed the triage vital signs and the nursing notes.  Pertinent labs & imaging results that were available during my care of the patient were reviewed by me and  considered in my medical decision making (see chart for details).    MDM Rules/Calculators/A&P  86 year old lady presenting to ER with concern for shortness of breath, weakness.  On exam, patient well-appearing in no acute distress, tachycardia.  Heart rate 130s, EKG demonstrating atrial flutter rapid ventricular response.  Labs grossly normal, noted.  Based on review of chart, this is long standing issue that cardiology had been monitoring closely in out pt setting. Consulted cards who came to bedside. Dr. Marlou Porch will admit for further management, optimization of medications for better control.   Final Clinical Impression(s) / ED Diagnoses Final diagnoses:  Paroxysmal atrial fibrillation (HCC)  Atrial flutter, unspecified type [facility])    Rx / DC Orders ED Discharge Orders    None       Lucrezia Starch, MD 08/21/19 1536

## 2019-08-21 NOTE — Telephone Encounter (Signed)
Rachel Park has not improved since we started Lasix 4-5 days ago. Her leg swelling is better but she is still very short of breath. I am out of town and cannot work her in. Her son and his family are also out of town  I think our best option is to have her go to the [facility] ER and be evaluated

## 2019-08-21 NOTE — H&P (Addendum)
Cardiology Admission History and Physical:   Patient ID: Rachel Park MRN: [id number]; DOB: Oct 07, 1931   Admission date: 08/21/2019  Primary Care Provider: Lajean Manes, [facility] Cardiologist: Mertie Moores, MD  [facility] Electrophysiologist:  Will Meredith Leeds, MD   Chief Complaint: Shortness of breath  Patient Profile:   Rachel Park is a 86 y.o. female with CAD, paroxysmal atrial fibrillation, prior stroke with shortness of breath  History of Present Illness:   Rachel Park is an 86 year old female, mother of Dr. Sherren Mocha Early (vascular surgery) who has been complaining of worsening shortness of breath over the last several days.  This has been going on over the past 4 days and nights, not sleeping well.  She did take Lasix yesterday but did not notice any significant improvement.  Continues to cough several times a day.  She herself can hear herself wheezing when she sits quietly.  Abdomen seems distended.  She notes that her weight has increased about 6 pounds since June 7.  Watch sodium content in her food.  Overall she is laying here in the emergency room.  Does look like she has slightly increased respiratory rate.  Has been having trouble sleeping.  Denies any chest pain.  She does have some mild discomfort below her bra line bilaterally that she is noted for some time.  Chest x-ray is unremarkable   Past Medical History:  Diagnosis Date   Arthritis    Complication of anesthesia    Coronary artery disease    Hypertension    Hypothyroidism    PAF (paroxysmal atrial fibrillation) ([place])    on Xarelto for maybe a year, opted to stop   PONV (postoperative nausea and vomiting)    Renal infarction ([place])    Small bowel obstruction ([place]) 06/2015   Stroke ([place])    oct 2016   TIA (transient ischemic attack) 03/24/2015   Vertigo, benign positional    to be evaluated, intermittent    Past Surgical History:  Procedure Laterality Date   ABDOMINAL  HYSTERECTOMY     APPLICATION OF WOUND VAC N/A 07/23/2018   Procedure: APPLICATION OF WOUND VAC;  Surgeon: Rolm Bookbinder, MD;  Location: [place];  Service: General;  Laterality: N/A;   BOWEL RESECTION N/A 07/23/2018   Procedure: SMALL BOWEL RESECTION;  Surgeon: Rolm Bookbinder, MD;  Location: [place];  Service: General;  Laterality: N/A;   CARDIAC CATHETERIZATION N/A 12/24/2014   Procedure: Left Heart Cath and Coronary Angiography;  Surgeon: Sherren Mocha, MD;  Location: [place] CV [facility];  Service: Cardiovascular;  Laterality: N/A;   CARDIOVERSION N/A 05/22/2018   Procedure: CARDIOVERSION;  Surgeon: Josue Hector, MD;  Location: [facility] ENDOSCOPY;  Service: Cardiovascular;  Laterality: N/A;   CATARACT EXTRACTION Bilateral    COLONOSCOPY WITH PROPOFOL N/A 12/01/2013   Procedure: COLONOSCOPY WITH PROPOFOL;  Surgeon: Garlan Fair, MD;  Location: [facility] ENDOSCOPY;  Service: Endoscopy;  Laterality: N/A;   COMPLEX WOUND CLOSURE  07/25/2018   Procedure: Complex Wound Closure;  Surgeon: Rolm Bookbinder, MD;  Location: [place];  Service: General;;   Pennie Rushing  07/25/2018   Procedure: Ileocecetomy;  Surgeon: Rolm Bookbinder, MD;  Location: [place];  Service: General;;   [place] N/A 03/10/2016   Procedure: INCISIONAL HERNIA REPAIR TIMES TWO;  Surgeon: Armandina Gemma, MD;  Location: [place];  Service: General;  Laterality: N/A;   INGUINAL HERNIA REPAIR Bilateral 03/10/2016   Procedure: BILATERAL INGUINAL HERNIA REPAIRS;  Surgeon: Armandina Gemma, MD;  Location: [facility]  OR;  Service: General;  Laterality: Bilateral;   INSERTION OF MESH N/A 03/10/2016   Procedure: INSERTION OF MESH TO BILATERAL GROINS AND ABDOMEN;  Surgeon: Armandina Gemma, MD;  Location: [place];  Service: General;  Laterality: N/A;   laparotomy  06/2015   LAPAROTOMY N/A 07/23/2018   Procedure: EXPLORATORY LAPAROTOMY;  Surgeon: Rolm Bookbinder, MD;  Location: [place];  Service: General;  Laterality: N/A;   LEFT HEART CATH AND  CORONARY ANGIOGRAPHY N/A 09/27/2016   Procedure: Left Heart Cath and Coronary Angiography;  Surgeon: Sherren Mocha, MD;  Location: [place] CV LAB;  Service: Cardiovascular;  Laterality: N/A;   TONSILLECTOMY       Medications Prior to Admission: Prior to Admission medications   Medication Sig Start Date End Date Taking? Authorizing Provider  acetaminophen (TYLENOL) 500 MG tablet Take 1,000 mg by mouth every 6 (six) hours as needed for mild pain.    Yes [provider]  apixaban (ELIQUIS) 5 MG TABS tablet Take 1 tablet (5 mg total) by mouth 2 (two) times daily. 09/24/18  Yes Nahser, Wonda Cheng, MD  Calcium Carb-Cholecalciferol (CALCIUM + D3 PO) Take 1 tablet by mouth daily at 12 noon.   Yes [provider]  Cholecalciferol (VITAMIN D3) 50 MCG (2000 UT) TABS Take 2,000 Units by mouth daily with lunch.   Yes [provider]  furosemide (LASIX) 20 MG tablet Take 10 mg by mouth daily.   Yes [provider]  levothyroxine (SYNTHROID, LEVOTHROID) 75 MCG tablet Take 75 mcg by mouth daily before breakfast.   Yes [provider]  metoprolol tartrate (LOPRESSOR) 25 MG tablet Take 1 tablet (25 mg total) by mouth 2 (two) times daily. 08/11/19  Yes Nahser, Wonda Cheng, MD  Multiple Vitamin (MULTIVITAMIN WITH MINERALS) TABS tablet Take 1 tablet by mouth daily. 08/10/18  Yes Bonnita Hollow, MD  NITROSTAT 0.4 MG SL tablet Place 1 tablet (0.4 mg total) under the tongue every 5 (five) minutes as needed. Chest pain 06/24/18  Yes Nahser, Wonda Cheng, MD  Polyethyl Glycol-Propyl Glycol (SYSTANE ULTRA) 0.4-0.3 % SOLN Place 1-2 drops into both eyes every other day.    Yes [provider]  rosuvastatin (CRESTOR) 10 MG tablet Take 1 tablet by mouth once daily Patient taking differently: Take 10 mg by mouth daily.  01/06/19  Yes Nahser, Wonda Cheng, MD     Allergies:    Allergies  Allergen Reactions   Propofol Nausea And Vomiting    "with hysterectomy years back had post  op N/V"    Social History:   Social History   Socioeconomic History   Marital status: Divorced    Spouse name: Not on file   Number of children: 2   Years of education: HS   Highest education level: Not on file  Occupational History   Occupation: retired  Tobacco Use   Smoking status: Never Smoker   Smokeless tobacco: Never Used  Scientific laboratory technician Use: Never used  Substance and Sexual Activity   Alcohol use: Not Currently    Comment: occ.   Drug use: No   Sexual activity: Not on file  Other Topics Concern   Not on file  Social History Narrative   Lives alone.  Widow.  Two children   Social Determinants of Health   Financial Resource Strain:    Difficulty of Paying Living Expenses:   Food Insecurity:    Worried About Charity fundraiser in the Last Year:  Ran Out of Food in the Last Year:   Transportation Needs:    Film/video editor (Medical):    Lack of Transportation (Non-Medical):   Physical Activity:    Days of Exercise per Week:    Minutes of Exercise per Session:   Stress:    Feeling of Stress :   Social Connections:    Frequency of Communication with Friends and Family:    Frequency of Social Gatherings with Friends and Family:    Attends Religious Services:    Active Member of Clubs or Organizations:    Attends Music therapist:    Marital Status:   Intimate Partner Violence:    Fear of Current or Ex-Partner:    Emotionally Abused:    Physically Abused:    Sexually Abused:     Family History:   The patient's family history includes Cancer in her sister; Heart attack (age of onset: 67) in her father; Stroke in her mother.    ROS:  Please see the history of present illness.  All other ROS reviewed and negative.     Physical Exam/Data:   Vitals:   08/21/19 1053 08/21/19 1230  BP: (!) 150/110 (!) 138/111  Pulse: (!) 138 (!) 136  Resp: (!) 22 (!) 28  Temp: 97.6 F (36.4 C)   TempSrc: Oral     SpO2: 96% 96%  Weight: 57.2 kg   Height: 5\' 2"  (1.575 m)    No intake or output data in the 24 hours ending 08/21/19 1336 Last 3 Weights 08/21/2019 08/11/2019 06/16/2019  Weight (lbs) 126 lb 126 lb 8 oz 127 lb 6.4 oz  Weight (kg) 57.153 kg 57.38 kg 57.788 kg     Body mass index is 23.05 kg/m.  General:  Well nourished, well developed, in no acute distress HEENT: normal Lymph: no adenopathy Neck: no JVD Endocrine:  No thryomegaly Vascular: No carotid bruits; FA pulses 2+ bilaterally without bruits  Cardiac:  normal S1, S2; tachycardic regular; no murmur  Lungs:  clear to auscultation bilaterally, no wheezing, rhonchi or rales  Abd: soft, nontender, no hepatomegaly  Ext: 1+ lower edema Musculoskeletal:  No deformities, BUE and BLE strength normal and equal Skin: warm and dry  Neuro:  CNs 2-12 intact, no focal abnormalities noted Psych:  Normal affect    EKG:  The ECG that was done  was personally reviewed and demonstrates atrial flutter 137 bpm 2-1 conduction  Relevant CV Studies:  Echocardiogram 04/21/2018:  1. The left ventricle has normal systolic function, with an ejection  fraction of 55-60%. The cavity size was normal. Mild basal septal  hypertrophy. Left ventricular diastology could not be evaluated secondary  to atrial fibrillation.  2. The right ventricle has normal systolic function. The cavity was  normal. There is no increase in right ventricular wall thickness.  3. Left atrial size was moderately dilated.  4. The mitral valve is normal in structure.  5. The tricuspid valve is normal in structure. Tricuspid valve  regurgitation is mild-moderate.  6. The aortic valve is tricuspid Mild sclerosis of the aortic valve.  7. The pulmonic valve was normal in structure. Pulmonic valve  regurgitation is mild by color flow Doppler.  8. Right atrial pressure is estimated at 3 mmHg.   Cardiac catheterization 09/27/2016: 1. Single-vessel coronary artery disease with  patency of the proximal LAD stent and mild nonobstructive stenosis in the mid vessel 2. Widely patent left main, left circumflex, and RCA with no significant obstructive lesions. 3.  Normal LV function with estimated LVEF 55-65%  Diagnostic Dominance: Right     Laboratory Data:  High Sensitivity Troponin:   Recent Labs  Lab 08/21/19 1102  TROPONINIHS 6      Chemistry Recent Labs  Lab 08/21/19 1102  NA 136  K 3.9  CL 106  CO2 18*  GLUCOSE 104*  BUN 11  CREATININE 0.68  CALCIUM 8.7*  GFRNONAA >60  GFRAA >60  ANIONGAP 12    No results for input(s): PROT, ALBUMIN, AST, ALT, ALKPHOS, BILITOT in the last 168 hours. Hematology Recent Labs  Lab 08/21/19 1102  WBC 6.9  RBC 3.74*  HGB 11.6*  HCT 35.7*  MCV 95.5  MCH 31.0  MCHC 32.5  RDW 13.8  PLT 260   BNPNo results for input(s): BNP, PROBNP in the last 168 hours.  DDimer No results for input(s): DDIMER in the last 168 hours.   Radiology/Studies:  DG Chest 2 View  Result Date: 08/21/2019 CLINICAL DATA:  Increased short of breath.  Cough EXAM: CHEST - 2 VIEW COMPARISON:  08/02/2018 FINDINGS: Normal cardiac silhouette. Bilateral small effusions similar comparison exam. Mild basilar atelectasis. No pulmonary edema. No pneumothorax. No focal infiltrate. IMPRESSION: Bilateral small effusions and mild basilar atelectasis are similar to comparison exam. Electronically Signed   By: Suzy Bouchard M.D.   On: 08/21/2019 11:46   Assessment and Plan:   Shortness of breath -Likely associated with paroxysmal atrial flutter, rapid ventricular response. -We will try for improved rate control with increased metoprolol.  Blood pressures have been soft at times. -Diuresis with IV Lasix, had been taking torsemide as needed.  We will use 40 mg IV twice daily   Persistent atrial fibrillation/flutter -She was previously started on Eliquis 5 mg twice a day.  In 2017 she was found to have a small bowel perforation was in the ICU for  2 days hypotensive started on Levophed and developed atrial fibrillation.  Her Plavix was stopped at that time.  Her Eliquis was discontinued due to bleeding.  She had remained on Plavix.  It was felt that her only episode of atrial fibrillation was in the setting of small bowel obstruction in 2017.  Her atrial fibrillation once again developed with ischemic bowel in Jul 23, 2018.  Prolonged ICU stay she was started on Eliquis at time of discharge. -She was also started on amiodarone.  She chemically converted back in August 2020 however she developed recurrent atrial flutter and her amiodarone was doubled.  After it was noted that she was in atrial fibrillation persistently, in November 2020 her amiodarone was stopped to continue with rate control strategy. -Metoprolol previously was stopped and she was placed on diltiazem because of fatigue.  Her fatigue was no different even off of the metoprolol.  She was then placed back on her metoprolol. -EKG on August 11, 2019 showed atrial fibrillation with heart rate of 146 bpm.  -We will go ahead and increase her metoprolol to 50 mg p.o. 4 times a day with hold parameters to see if we can reduce her heart rate.  Flutter can be challenging to rate control. -I will check an echocardiogram as well. -She is failed Tikosyn in the past as well. -If we are unsuccessful at controlling her heart rate, we will discuss further with Dr. Curt Bears.  Question possible AV nodal ablation with pacemaker.  Chronic anticoagulation -Eliquis 5 mg twice a day.  No signs of bleeding.  Coronary artery disease -Had repeat heart catheterization in 2018, showed  no obstructive CAD with LAD stent widely patent.  No current angina.  Prior stroke post catheterization  Hyperlipidemia -Has tried Crestor 10 mg in the past, to decrease the dose to 5 mg because of muscle aches.   Severity of Illness: The appropriate patient status for this patient is OBSERVATION. Observation status is judged  to be reasonable and necessary in order to provide the required intensity of service to ensure the patient's safety. The patient's presenting symptoms, physical exam findings, and initial radiographic and laboratory data in the context of their medical condition is felt to place them at decreased risk for further clinical deterioration. Furthermore, it is anticipated that the patient will be medically stable for discharge from the hospital within 2 midnights of admission. The following factors support the patient status of observation.   " The patient's presenting symptoms include SOB. " The physical exam findings include RRR. " The initial radiographic and laboratory data are normal.     For questions or updates, please contact [place] Please consult [email or web address] for contact info under     Signed, Candee Furbish, MD  08/21/2019 1:36 PM

## 2019-08-21 NOTE — ED Notes (Signed)
Cardiology at bedside.

## 2019-08-21 NOTE — ED Triage Notes (Signed)
Pt here from home, sent by cardiologist for eval of worsening shob with exertion x 1 month. Pt in afib. Denies chest pain.

## 2019-08-21 NOTE — Telephone Encounter (Signed)
Returned call to patient who states she is not feeling well. States she has had 4 days and nights of SOB and not sleeping. Took lasix yesterday, does not notice significant improvement. Not sleeping well due to difficulty breathing.  Coughing spells occurring several times per day; hears herself wheezing when she sits quietly. States her abdomen is distended. Reports rt ankle swelling decreases with lasix. Weight has increased 5-6 lbs since 6/7 Has decreased sodium intake. We discussed options for treatment and she would like me to get Dr. Elmarie Shiley advice. Advised that I will forward message to him and call her back with his advice. She verbalized understanding and agreement with plan and thanked me for the call.

## 2019-08-22 ENCOUNTER — Observation Stay (HOSPITAL_COMMUNITY): Payer: Medicare HMO

## 2019-08-22 DIAGNOSIS — Z7901 Long term (current) use of anticoagulants: Secondary | ICD-10-CM | POA: Diagnosis not present

## 2019-08-22 DIAGNOSIS — I509 Heart failure, unspecified: Secondary | ICD-10-CM | POA: Diagnosis present

## 2019-08-22 DIAGNOSIS — M1990 Unspecified osteoarthritis, unspecified site: Secondary | ICD-10-CM | POA: Diagnosis present

## 2019-08-22 DIAGNOSIS — Z20822 Contact with and (suspected) exposure to covid-19: Secondary | ICD-10-CM | POA: Diagnosis not present

## 2019-08-22 DIAGNOSIS — Z8249 Family history of ischemic heart disease and other diseases of the circulatory system: Secondary | ICD-10-CM | POA: Diagnosis not present

## 2019-08-22 DIAGNOSIS — E038 Other specified hypothyroidism: Secondary | ICD-10-CM | POA: Diagnosis not present

## 2019-08-22 DIAGNOSIS — Z79890 Hormone replacement therapy (postmenopausal): Secondary | ICD-10-CM | POA: Diagnosis not present

## 2019-08-22 DIAGNOSIS — Z884 Allergy status to anesthetic agent status: Secondary | ICD-10-CM | POA: Diagnosis not present

## 2019-08-22 DIAGNOSIS — I4892 Unspecified atrial flutter: Secondary | ICD-10-CM | POA: Diagnosis not present

## 2019-08-22 DIAGNOSIS — Z955 Presence of coronary angioplasty implant and graft: Secondary | ICD-10-CM | POA: Diagnosis not present

## 2019-08-22 DIAGNOSIS — I5031 Acute diastolic (congestive) heart failure: Secondary | ICD-10-CM

## 2019-08-22 DIAGNOSIS — Z86718 Personal history of other venous thrombosis and embolism: Secondary | ICD-10-CM | POA: Diagnosis not present

## 2019-08-22 DIAGNOSIS — E785 Hyperlipidemia, unspecified: Secondary | ICD-10-CM | POA: Diagnosis not present

## 2019-08-22 DIAGNOSIS — I2510 Atherosclerotic heart disease of native coronary artery without angina pectoris: Secondary | ICD-10-CM | POA: Diagnosis not present

## 2019-08-22 DIAGNOSIS — I429 Cardiomyopathy, unspecified: Secondary | ICD-10-CM | POA: Diagnosis not present

## 2019-08-22 DIAGNOSIS — I5021 Acute systolic (congestive) heart failure: Secondary | ICD-10-CM | POA: Diagnosis not present

## 2019-08-22 DIAGNOSIS — Z79899 Other long term (current) drug therapy: Secondary | ICD-10-CM | POA: Diagnosis not present

## 2019-08-22 DIAGNOSIS — E039 Hypothyroidism, unspecified: Secondary | ICD-10-CM | POA: Diagnosis not present

## 2019-08-22 DIAGNOSIS — I5022 Chronic systolic (congestive) heart failure: Secondary | ICD-10-CM | POA: Diagnosis not present

## 2019-08-22 DIAGNOSIS — I110 Hypertensive heart disease with heart failure: Secondary | ICD-10-CM | POA: Diagnosis not present

## 2019-08-22 DIAGNOSIS — I4819 Other persistent atrial fibrillation: Secondary | ICD-10-CM | POA: Diagnosis not present

## 2019-08-22 DIAGNOSIS — Z823 Family history of stroke: Secondary | ICD-10-CM | POA: Diagnosis not present

## 2019-08-22 DIAGNOSIS — Z8673 Personal history of transient ischemic attack (TIA), and cerebral infarction without residual deficits: Secondary | ICD-10-CM | POA: Diagnosis not present

## 2019-08-22 LAB — ECHOCARDIOGRAM COMPLETE
Height: 62 in
Weight: 2116.42 oz

## 2019-08-22 LAB — CBC
HCT: 32.4 % — ABNORMAL LOW (ref 36.0–46.0)
Hemoglobin: 10.6 g/dL — ABNORMAL LOW (ref 12.0–15.0)
MCH: 30.9 pg (ref 26.0–34.0)
MCHC: 32.7 g/dL (ref 30.0–36.0)
MCV: 94.5 fL (ref 80.0–100.0)
Platelets: 239 10*3/uL (ref 150–400)
RBC: 3.43 MIL/uL — ABNORMAL LOW (ref 3.87–5.11)
RDW: 14 % (ref 11.5–15.5)
WBC: 6.6 10*3/uL (ref 4.0–10.5)
nRBC: 0 % (ref 0.0–0.2)

## 2019-08-22 LAB — BASIC METABOLIC PANEL
Anion gap: 9 (ref 5–15)
BUN: 15 mg/dL (ref 8–23)
CO2: 23 mmol/L (ref 22–32)
Calcium: 8.6 mg/dL — ABNORMAL LOW (ref 8.9–10.3)
Chloride: 103 mmol/L (ref 98–111)
Creatinine, Ser: 0.73 mg/dL (ref 0.44–1.00)
GFR calc Af Amer: >60 mL/min (ref >60)
GFR calc non Af Amer: >60 mL/min (ref >60)
Glucose, Bld: 94 mg/dL (ref 70–99)
Potassium: 4.1 mmol/L (ref 3.5–5.1)
Sodium: 135 mmol/L (ref 135–145)

## 2019-08-22 LAB — LIPID PANEL
Cholesterol: 85 mg/dL (ref 0–200)
HDL: 45 mg/dL (ref >40)
LDL Cholesterol: 29 mg/dL (ref 0–99)
Total CHOL/HDL Ratio: 1.9 RATIO
Triglycerides: 53 mg/dL (ref <150)
VLDL: 11 mg/dL (ref 0–40)

## 2019-08-22 MED ORDER — METOPROLOL TARTRATE 25 MG PO TABS
25.0000 mg | ORAL_TABLET | Freq: Once | ORAL | Status: AC
  Administered 2019-08-22: 25 mg via ORAL
  Filled 2019-08-22: qty 1

## 2019-08-22 MED ORDER — METOPROLOL TARTRATE 50 MG PO TABS
50.0000 mg | ORAL_TABLET | Freq: Four times a day (QID) | ORAL | Status: DC
  Administered 2019-08-22 – 2019-08-25 (×12): 50 mg via ORAL
  Filled 2019-08-22 (×12): qty 1

## 2019-08-22 NOTE — Progress Notes (Addendum)
  Mobility Specialist Criteria Algorithm Info.  SATURATION QUALIFICATIONS: (This note is used to comply with regulatory documentation for home oxygen)  Patient Saturations on Room Air at Rest = 99%  Patient Saturations on Room Air while Ambulating = 97%  Patient Saturations on N/A Liters of oxygen while Ambulating = N/A  Please briefly explain why patient needs home oxygen:  Mobility Team:  HOB elevated: Activity: Ambulated in hall (In chair before and after ambulation) Range of motion: Active;All extremities Level of assistance: Independent Assistive device: None Minutes sitting in chair: No data recorded Minutes stood: 5 minutes Minutes ambulated: 5 minutes Distance ambulated (ft): 500 ft Mobility response: Tolerated well Bed Position: Chair (Recliner chair)  HR Pre: 122 HR Post: 130  08/22/2019 11:20 AM

## 2019-08-22 NOTE — TOC Initial Note (Signed)
Transition of Care [facility]) - Initial/Assessment Note    Patient Details  Name: Rachel Park MRN: [id number] Date of Birth: 23-Aug-1931  Transition of Care [facility]) CM/SW Contact:    Verdell Carmine, RN Phone Number: 08/22/2019, 5:27 PM  Clinical Narrative:                 Eligibility for Eliquis  Done, 30 day card given to patient, she does not believe she has used it in the past.  NO needs identified currently. Will follow  Expected Discharge Plan: Home/Self Care Barriers to Discharge: Continued Medical Work up   Patient Goals and CMS Choice        Expected Discharge Plan and Services Expected Discharge Plan: Home/Self Care                                              Prior Living Arrangements/Services                       Activities of Daily Living      Permission Sought/Granted                  Emotional Assessment Appearance:: Appears younger than stated age Attitude/Demeanor/Rapport: Engaged Affect (typically observed): Calm, Appropriate Orientation: : Oriented to Self, Oriented to Place, Oriented to  Time, Oriented to Situation   Psych Involvement: No (comment)  Admission diagnosis:  CHF (congestive heart failure) (HCC) [I50.9] Paroxysmal atrial fibrillation (HCC) [I48.0] Atypical atrial flutter ([place]) [I48.4] Atrial flutter, unspecified type [facility]) [I48.92] Patient Active Problem List   Diagnosis Date Noted  . CHF (congestive heart failure) ([place]) 08/21/2019  . Atrial flutter ([place]) 10/23/2018  . Acute deep vein thrombosis (DVT) of upper extremity ([place])   . Ileus ([place])   . SBO (small bowel obstruction) ([place]) 07/23/2018  . Small bowel obstruction ([place]) 07/22/2018  . Atrial fibrillation ([place]) 05/20/2018  . Renal infarct ([place])   . Hyperlipidemia 11/27/2016  . Bilateral inguinal hernia without obstruction or gangrene 03/07/2016  . Incisional hernia, without obstruction or gangrene 03/07/2016  . Persistent atrial fibrillation  06/21/2015  . TIA (transient ischemic attack) 03/24/2015  . CAD (coronary artery disease) 12/26/2014  . COPD (chronic obstructive pulmonary disease) ([place]) 12/26/2014  . Hypothyroidism 12/26/2014  . Cerebral embolism with cerebral infarction 12/25/2014   PCP:  Lajean Manes, MD Pharmacy:   [place], [place] - [address] [place] [address] Phone: [phone] Fax: [phone]     Social Determinants of Health (SDOH) Interventions    Readmission Risk Interventions No flowsheet data found.

## 2019-08-22 NOTE — TOC Benefit Eligibility Note (Signed)
Transition of Care [facility]) Benefit Eligibility Note    Patient Details  Name: Rachel Park MRN: [id number] Date of Birth: [birth year]   Medication/Dose: Eliquis 5mg  bid  Covered?: Yes     Prescription Coverage Preferred Pharmacy: [facility], [facility], [facility], and [employer] with Person/Company/Phone Number:: [facility] [phone]  Co-Pay: $345.80 35 day mail order/ $47.00 for 30 day retail  Prior Approval: No     Additional Notes: if patient obtains 90 day supply, it puts her in her coverage gap    Delorse Lek Phone Number: 08/22/2019, 12:34 PM

## 2019-08-22 NOTE — Progress Notes (Signed)
   08/22/19 0621  Assess: MEWS Score  Temp 98.4 F (36.9 C)  BP (!) 144/95  Pulse Rate (!) 101  ECG Heart Rate (!) 124  Resp 15  SpO2 95 %  O2 Device Room Air  Assess: MEWS Score  MEWS Temp 0  MEWS Systolic 0  MEWS Pulse 2  MEWS RR 0  MEWS LOC 0  MEWS Score 2  MEWS Score Color Yellow  Assess: if the MEWS score is Yellow or Red  Were vital signs taken at a resting state? Yes  Focused Assessment Documented focused assessment  Early Detection of Sepsis Score *See Row Information* Low  MEWS guidelines implemented *See Row Information* Yes  Treat  MEWS Interventions Escalated (See documentation below)  Take Vital Signs  Increase Vital Sign Frequency  Yellow: Q 2hr X 2 then Q 4hr X 2, if remains yellow, continue Q 4hrs  Escalate  MEWS: Escalate Yellow: discuss with charge nurse/RN and consider discussing with provider and RRT  Notify: Charge Nurse/RN  Name of Charge Nurse/RN Notified [place] RN  Date Charge Nurse/RN Notified 08/22/19  Time Charge Nurse/RN Notified [phone]  Notify: Provider  Provider Name/Title  (MD aware of HR)  Notify: Rapid Response  Name of Rapid Response RN Notified  (none)  Document  Patient Outcome Stabilized after interventions;Other (Comment) (Pt asymtomatic. HR has been elevated. )

## 2019-08-22 NOTE — Progress Notes (Addendum)
Progress Note  Patient Name: Rachel Park Date of Encounter: 08/22/2019  [facility] Cardiologist: Mertie Moores, MD   Subjective   86 year old with CAD paroxysmal atrial fibrillation prior stroke here with atrial flutter rapid ventricular response in the 130s with shortness of breath.  She states that she did have somewhat of a rough night.  Feels better this morning.  Was able to walk the hallway with [place].  No chest pain.  Minimal shortness of breath.  Heart rate mildly improved.  Inpatient Medications    Scheduled Meds: . apixaban  2.5 mg Oral BID  . levothyroxine  75 mcg Oral QAC breakfast  . metoprolol tartrate  25 mg Oral QID  . rosuvastatin  10 mg Oral Daily   Continuous Infusions:  PRN Meds: acetaminophen, ondansetron (ZOFRAN) IV   Vital Signs    Vitals:   08/21/19 2032 08/21/19 2308 08/22/19 0621 08/22/19 0754  BP: (!) 122/94 109/84 (!) 144/95 (!) 131/97  Pulse: 74 84 (!) 101 (!) 122  Resp: 16 18 15 18   Temp: (!) 97.5 F (36.4 C) 98.3 F (36.8 C) 98.4 F (36.9 C) 98.4 F (36.9 C)  TempSrc: Oral Oral Oral Oral  SpO2: 98% 95% 95% 95%  Weight:   60 kg   Height:        Intake/Output Summary (Last 24 hours) at 08/22/2019 1036 Last data filed at 08/22/2019 0343 Gross per 24 hour  Intake 240 ml  Output 200 ml  Net 40 ml   Last 3 Weights 08/22/2019 08/21/2019 08/11/2019  Weight (lbs) 132 lb 4.4 oz 126 lb 126 lb 8 oz  Weight (kg) 60 kg 57.153 kg 57.38 kg      Telemetry    Heart rate slightly better controlled.  We are able to break her 2-1 atrial flutter occasionally she will be 3-1.  This was with metoprolol 25- Personally Reviewed  ECG    Prior EKG atrial flutter 2-1- Personally Reviewed  Physical Exam   GEN: No acute distress.   Neck: No JVD Cardiac:  Irregularly irregular, mildly tachycardic no murmurs, rubs, or gallops.  Respiratory: Clear to auscultation bilaterally. GI: Soft, nontender, non-distended  MS: No edema; No deformity. Neuro:   Nonfocal  Psych: Normal affect   Labs    High Sensitivity Troponin:   Recent Labs  Lab 08/21/19 1102 08/21/19 1300  TROPONINIHS 6 7      Chemistry Recent Labs  Lab 08/21/19 1102 08/22/19 0552  NA 136 135  K 3.9 4.1  CL 106 103  CO2 18* 23  GLUCOSE 104* 94  BUN 11 15  CREATININE 0.68 0.73  CALCIUM 8.7* 8.6*  GFRNONAA >60 >60  GFRAA >60 >60  ANIONGAP 12 9     Hematology Recent Labs  Lab 08/21/19 1102 08/22/19 0552  WBC 6.9 6.6  RBC 3.74* 3.43*  HGB 11.6* 10.6*  HCT 35.7* 32.4*  MCV 95.5 94.5  MCH 31.0 30.9  MCHC 32.5 32.7  RDW 13.8 14.0  PLT 260 239    BNPNo results for input(s): BNP, PROBNP in the last 168 hours.   DDimer No results for input(s): DDIMER in the last 168 hours.   Radiology    DG Chest 2 View  Result Date: 08/21/2019 CLINICAL DATA:  Increased short of breath.  Cough EXAM: CHEST - 2 VIEW COMPARISON:  08/02/2018 FINDINGS: Normal cardiac silhouette. Bilateral small effusions similar comparison exam. Mild basilar atelectasis. No pulmonary edema. No pneumothorax. No focal infiltrate. IMPRESSION: Bilateral small effusions and mild  basilar atelectasis are similar to comparison exam. Electronically Signed   By: Suzy Bouchard M.D.   On: 08/21/2019 11:46    Cardiac Studies   Echocardiogram pending  Patient Profile     86 y.o. female here with shortness of breath, atrial flutter 137 beats a minute 2-1 conduction  Assessment & Plan    Atrial flutter with rapid ventricular response - I increased her metoprolol to 50 mg p.o. 4 times a day with hold parameters to hopefully help better rate control.  We have placed hold parameters for caution, hold if systolic blood pressure less than 95, heart rate less than 55.  Watch for any signs of pauses.  Her heart rate is improved with the 25x4 doses that she has gotten, however we are still not at goal. -Hopefully tomorrow, will be able to consolidate metoprolol and allow for potential  discharge. -Echocardiogram pending.  We want to make sure she does not have a tachycardia mediated cardiomyopathy.  Chronic anticoagulation -Continue with Eliquis 2.5 mg twice a day no signs of bleeding.  Coronary artery disease -Repeat heart catheterization 2018 showed no obstructive CAD.  LAD stent was widely patent.  She did have stroke post catheterization.  Hyperlipidemia -On low-dose Crestor because of muscle aches.  Hypothyroidism -TSH is 11.  She is on Synthroid 75 mcg.  I will check a free T4.  Spoke with her son, Dr. Sherren Mocha Early to update him.  For questions or updates, please contact [place] Please consult [email or web address] for contact info under        Signed, Candee Furbish, MD  08/22/2019, 10:36 AM

## 2019-08-22 NOTE — Progress Notes (Signed)
Echocardiogram 2D Echocardiogram has been performed.  Rachel Park Rachel Park 08/22/2019, 2:24 PM

## 2019-08-23 LAB — BASIC METABOLIC PANEL
Anion gap: 8 (ref 5–15)
BUN: 18 mg/dL (ref 8–23)
CO2: 24 mmol/L (ref 22–32)
Calcium: 8.9 mg/dL (ref 8.9–10.3)
Chloride: 102 mmol/L (ref 98–111)
Creatinine, Ser: 0.64 mg/dL (ref 0.44–1.00)
GFR calc Af Amer: >60 mL/min (ref >60)
GFR calc non Af Amer: >60 mL/min (ref >60)
Glucose, Bld: 109 mg/dL — ABNORMAL HIGH (ref 70–99)
Potassium: 4.3 mmol/L (ref 3.5–5.1)
Sodium: 134 mmol/L — ABNORMAL LOW (ref 135–145)

## 2019-08-23 LAB — T4, FREE: Free T4: 0.86 ng/dL (ref 0.61–1.12)

## 2019-08-23 MED ORDER — FUROSEMIDE 10 MG/ML IJ SOLN
20.0000 mg | Freq: Once | INTRAMUSCULAR | Status: AC
  Administered 2019-08-23: 20 mg via INTRAVENOUS
  Filled 2019-08-23: qty 2

## 2019-08-23 MED ORDER — DILTIAZEM HCL ER 60 MG PO CP12
120.0000 mg | ORAL_CAPSULE | Freq: Two times a day (BID) | ORAL | Status: DC
  Administered 2019-08-23 – 2019-08-24 (×3): 120 mg via ORAL
  Filled 2019-08-23 (×3): qty 2

## 2019-08-23 NOTE — Progress Notes (Signed)
SATURATION QUALIFICATIONS: (This note is used to comply with regulatory documentation for home oxygen)  Patient Saturations on Room Air at Rest = 99%  Patient Saturations on Room Air while Ambulating = 97%   

## 2019-08-23 NOTE — Progress Notes (Signed)
   08/23/19 0530  Assess: MEWS Score  Temp 97.8 F (36.6 C)  BP (!) 136/93  Pulse Rate (!) 102  ECG Heart Rate (!) 113  Resp 20  SpO2 95 %  Assess: MEWS Score  MEWS Temp 0  MEWS Systolic 0  MEWS Pulse 2  MEWS RR 0  MEWS LOC 0  MEWS Score 2  MEWS Score Color Yellow  Assess: if the MEWS score is Yellow or Red  Were vital signs taken at a resting state? Yes  Focused Assessment Documented focused assessment  Early Detection of Sepsis Score *See Row Information* Low  MEWS guidelines implemented *See Row Information* Yes  Treat  MEWS Interventions Escalated (See documentation below)  Take Vital Signs  Increase Vital Sign Frequency  Yellow: Q 2hr X 2 then Q 4hr X 2, if remains yellow, continue Q 4hrs  Escalate  MEWS: Escalate Yellow: discuss with charge nurse/RN and consider discussing with provider and RRT  Notify: Charge Nurse/RN  Name of Charge Nurse/RN Notified [place] RN  Date Charge Nurse/RN Notified 08/22/19  Time Charge Nurse/RN Notified 0544  Notify: Provider  Provider Name/Title  (none)  Notify: Rapid Response  Name of Rapid Response RN Notified  (none)  Document  Patient Outcome Stabilized after interventions;Other (Comment) (Pt stable. Asymptomatic. )  Progress note created (see row info) Yes

## 2019-08-23 NOTE — Progress Notes (Addendum)
Progress Note  Patient Name: Rachel Park Date of Encounter: 08/23/2019  [facility] Cardiologist: Mertie Moores, MD   Subjective   Continues to have shortness of breath.  She also has atrial fibrillation and flutter with rapid ventricular response.  Inpatient Medications    Scheduled Meds: . apixaban  2.5 mg Oral BID  . levothyroxine  75 mcg Oral QAC breakfast  . metoprolol tartrate  50 mg Oral QID  . rosuvastatin  10 mg Oral Daily   Continuous Infusions:  PRN Meds: acetaminophen, ondansetron (ZOFRAN) IV   Vital Signs    Vitals:   08/23/19 0340 08/23/19 0530 08/23/19 0850 08/23/19 0911  BP: (!) 135/97 (!) 136/93 (!) 136/95 135/83  Pulse: (!) 110 (!) 102 (!) 126 (!) 115  Resp: 15 20 20 20   Temp: 97.6 F (36.4 C) 97.8 F (36.6 C)  97.6 F (36.4 C)  TempSrc: Oral Oral    SpO2: 93% 95% 98% 98%  Weight:      Height:        Intake/Output Summary (Last 24 hours) at 08/23/2019 1119 Last data filed at 08/22/2019 2153 Gross per 24 hour  Intake 480 ml  Output --  Net 480 ml   Last 3 Weights 08/23/2019 08/22/2019 08/21/2019  Weight (lbs) 133 lb 14.4 oz 132 lb 4.4 oz 126 lb  Weight (kg) 60.737 kg 60 kg 57.153 kg      Telemetry    Atrial flutter personally reviewed  ECG    Prior EKG atrial flutter 2-1- Personally Reviewed  Physical Exam   GEN: Well nourished, well developed, in no acute distress  HEENT: normal  Neck: no JVD, carotid bruits, or masses Cardiac: Tachycardic, irregular; no murmurs, rubs, or gallops,no edema  Respiratory:  clear to auscultation bilaterally, normal work of breathing GI: soft, nontender, nondistended, + BS MS: no deformity or atrophy  Skin: warm and dry Neuro:  Strength and sensation are intact Psych: euthymic mood, full affect   Labs    High Sensitivity Troponin:   Recent Labs  Lab 08/21/19 1102 08/21/19 1300  TROPONINIHS 6 7      Chemistry Recent Labs  Lab 08/21/19 1102 08/22/19 0552 08/23/19 0002  NA 136 135  134*  K 3.9 4.1 4.3  CL 106 103 102  CO2 18* 23 24  GLUCOSE 104* 94 109*  BUN 11 15 18   CREATININE 0.68 0.73 0.64  CALCIUM 8.7* 8.6* 8.9  GFRNONAA >60 >60 >60  GFRAA >60 >60 >60  ANIONGAP 12 9 8      Hematology Recent Labs  Lab 08/21/19 1102 08/22/19 0552  WBC 6.9 6.6  RBC 3.74* 3.43*  HGB 11.6* 10.6*  HCT 35.7* 32.4*  MCV 95.5 94.5  MCH 31.0 30.9  MCHC 32.5 32.7  RDW 13.8 14.0  PLT 260 239    BNPNo results for input(s): BNP, PROBNP in the last 168 hours.   DDimer No results for input(s): DDIMER in the last 168 hours.   Radiology    DG Chest 2 View  Result Date: 08/21/2019 CLINICAL DATA:  Increased short of breath.  Cough EXAM: CHEST - 2 VIEW COMPARISON:  08/02/2018 FINDINGS: Normal cardiac silhouette. Bilateral small effusions similar comparison exam. Mild basilar atelectasis. No pulmonary edema. No pneumothorax. No focal infiltrate. IMPRESSION: Bilateral small effusions and mild basilar atelectasis are similar to comparison exam. Electronically Signed   By: Suzy Bouchard M.D.   On: 08/21/2019 11:46   ECHOCARDIOGRAM COMPLETE  Result Date: 08/22/2019    ECHOCARDIOGRAM REPORT  Patient Name:   Rachel Park Date of Exam: 08/22/2019 Medical Rec #:  [id number]    Height:       62.0 in Accession #:    [id number]   Weight:       132.3 lb Date of Birth:  01-14-1932   BSA:          1.604 m Patient Age:    86 years     BP:           124/80 mmHg Patient Gender: F            HR:           105 bpm. Exam Location:  Inpatient Procedure: 2D Echo, 3D Echo, Color Doppler and Cardiac Doppler Indications:    A21.30 Acute systolic (congestive) heart failure  History:        Patient has prior history of Echocardiogram examinations, most                 recent 04/21/2018. CHF, CAD, COPD, Arrythmias:Atrial                 Fibrillation; Risk Factors:Dyslipidemia and Hypertension.  Sonographer:    Raquel Sarna Senior RDCS Referring Phys: [id number] [place]  1. Left ventricular ejection  fraction, by estimation, is 45 to 50%. The left ventricle has mildly decreased function. The left ventricle demonstrates global hypokinesis. Left ventricular diastolic function could not be evaluated.  2. Right ventricular systolic function is mildly reduced. The right ventricular size is normal. There is mildly elevated pulmonary artery systolic pressure. The estimated right ventricular systolic pressure is 96.2 mmHg.  3. Left atrial size was severely dilated.  4. Right atrial size was moderately dilated.  5. The mitral valve is myxomatous. Moderate mitral valve regurgitation.  6. The aortic valve is normal in structure. Aortic valve regurgitation is trivial. Mild aortic valve sclerosis is present, with no evidence of aortic valve stenosis.  7. Aortic dilatation noted. There is borderline dilatation of the ascending aorta measuring 38 mm.  8. The inferior vena cava is dilated in size with <50% respiratory variability, suggesting right atrial pressure of 15 mmHg. Comparison(s): Prior images reviewed side by side. The left ventricular function is worsened. FINDINGS  Left Ventricle: Left ventricular ejection fraction, by estimation, is 45 to 50%. The left ventricle has mildly decreased function. The left ventricle demonstrates global hypokinesis. The left ventricular internal cavity size was normal in size. There is  no left ventricular hypertrophy. Left ventricular diastolic function could not be evaluated due to atrial fibrillation. Left ventricular diastolic function could not be evaluated. Right Ventricle: The right ventricular size is normal. No increase in right ventricular wall thickness. Right ventricular systolic function is mildly reduced. There is mildly elevated pulmonary artery systolic pressure. The tricuspid regurgitant velocity  is 2.40 m/s, and with an assumed right atrial pressure of 15 mmHg, the estimated right ventricular systolic pressure is 95.2 mmHg. Left Atrium: Left atrial size was severely  dilated. Right Atrium: Right atrial size was moderately dilated. Pericardium: There is no evidence of pericardial effusion. Mitral Valve: The mitral valve is myxomatous. There is mild thickening of the mitral valve leaflet(s). Mild mitral annular calcification. Moderate mitral valve regurgitation, with posteriorly-directed jet. Tricuspid Valve: The tricuspid valve is normal in structure. Tricuspid valve regurgitation is mild. Aortic Valve: The aortic valve is normal in structure. Aortic valve regurgitation is trivial. Aortic regurgitation PHT measures 449 msec. Mild aortic valve sclerosis is present, with no evidence  of aortic valve stenosis. Pulmonic Valve: The pulmonic valve was not well visualized. Pulmonic valve regurgitation is trivial. Aorta: Aortic dilatation noted. There is borderline dilatation of the ascending aorta measuring 38 mm. Venous: The inferior vena cava is dilated in size with less than 50% respiratory variability, suggesting right atrial pressure of 15 mmHg. IAS/Shunts: No atrial level shunt detected by color flow Doppler.  LEFT VENTRICLE PLAX 2D LVIDd:         3.80 cm LVIDs:         3.10 cm LV PW:         1.00 cm LV IVS:        1.00 cm LVOT diam:     2.00 cm LV SV:         40 LV SV Index:   25 LVOT Area:     3.14 cm  LV Volumes (MOD) LV vol d, MOD A2C: 56.4 ml LV vol d, MOD A4C: 79.9 ml LV vol s, MOD A2C: 30.7 ml LV vol s, MOD A4C: 40.7 ml LV SV MOD A2C:     25.7 ml LV SV MOD A4C:     79.9 ml LV SV MOD BP:      30.7 ml RIGHT VENTRICLE RV S prime:     7.62 cm/s TAPSE (M-mode): 1.4 cm LEFT ATRIUM             Index       RIGHT ATRIUM           Index LA diam:        3.00 cm 1.87 cm/m  RA Area:     21.80 cm LA Vol (A2C):   70.0 ml 43.65 ml/m RA Volume:   61.20 ml  38.16 ml/m LA Vol (A4C):   82.6 ml 51.51 ml/m LA Biplane Vol: 82.0 ml 51.13 ml/m  AORTIC VALVE LVOT Vmax:   69.70 cm/s LVOT Vmean:  49.367 cm/s LVOT VTI:    0.128 m AI PHT:      449 msec  AORTA Ao Root diam: 3.00 cm Ao Asc diam:   3.80 cm MR Peak grad:    88.0 mmHg   TRICUSPID VALVE MR Mean grad:    57.0 mmHg   TR Peak grad:   23.0 mmHg MR Vmax:         469.00 cm/s TR Vmax:        240.00 cm/s MR Vmean:        355.0 cm/s MR PISA:         2.02 cm    SHUNTS MR PISA Eff ROA: 17 mm      Systemic VTI:  0.13 m MR PISA Radius:  0.57 cm     Systemic Diam: 2.00 cm Dani Gobble Croitoru MD Electronically signed by Sanda Klein MD Signature Date/Time: 08/22/2019/2:44:17 PM    Final     Cardiac Studies   TTE 08/23/2019 1. Left ventricular ejection fraction, by estimation, is 45 to 50%. The  left ventricle has mildly decreased function. The left ventricle  demonstrates global hypokinesis. Left ventricular diastolic function could  not be evaluated.  2. Right ventricular systolic function is mildly reduced. The right  ventricular size is normal. There is mildly elevated pulmonary artery  systolic pressure. The estimated right ventricular systolic pressure is  16.1 mmHg.  3. Left atrial size was severely dilated.  4. Right atrial size was moderately dilated.  5. The mitral valve is myxomatous. Moderate mitral valve regurgitation.  6. The aortic valve is normal  in structure. Aortic valve regurgitation is  trivial. Mild aortic valve sclerosis is present, with no evidence of  aortic valve stenosis.  7. Aortic dilatation noted. There is borderline dilatation of the  ascending aorta measuring 38 mm.  8. The inferior vena cava is dilated in size with <50% respiratory  variability, suggesting right atrial pressure of 15 mmHg.   Patient Profile     86 y.o. female here with shortness of breath, atrial flutter 137 beats a minute 2-1 conduction  Assessment & Plan    Atrial flutter with rapid ventricular response Continuing to have issues with rate control of her atrial fib and flutter.  I am concerned as her ejection fraction has recently gone down and is now mildly reduced.  She Zyona Pettaway need much improved control of her heart rates  prior to discharge.  She is on 50 mg of metoprolol every 6 hours with somewhat decent rate control.  To improve her control, Alyzah Pelly start diltiazem.  She may require cardioversion as well.  I am hopeful that she Emony Dormer not require further therapy with AV node ablation and pacemaker implant, as she has failed both amiodarone and dofetilide.    Chronic anticoagulation Continue Eliquis  Coronary artery disease Cath in 2018 showed no obstructive coronary artery disease and a patent LAD stent.  No current chest pain.    Hyperlipidemia On low-dose Crestor.  Hypothyroidism Continue Synthroid.  Chronic systolic heart failure likely due to a tachycardia mediated cardiomyopathy Very important that heart rate is better controlled.  We Leandrea Ackley add diltiazem today and diurese with 20 mg of Lasix to see if this helps with her respiratory status.  Spoke with her son Dr. Sherren Mocha Early.  For questions or updates, please contact [place] Please consult [email or web address] for contact info under        Signed, Hallelujah Wysong Meredith Leeds, MD  08/23/2019, 11:19 AM

## 2019-08-23 NOTE — Progress Notes (Signed)
   08/23/19 0911  Assess: MEWS Score  Temp 97.6 F (36.4 C)  BP 135/83  Pulse Rate (!) 115  Resp 20  Level of Consciousness Alert  SpO2 98 %  O2 Device Room Air  Assess: MEWS Score  MEWS Temp 0  MEWS Systolic 0  MEWS Pulse 2  MEWS RR 0  MEWS LOC 0  MEWS Score 2  MEWS Score Color Yellow  Assess: if the MEWS score is Yellow or Red  Were vital signs taken at a resting state? Yes  Focused Assessment Documented focused assessment  Early Detection of Sepsis Score *See Row Information* Low  MEWS guidelines implemented *See Row Information* No, previously yellow, continue vital signs every 4 hours

## 2019-08-24 LAB — BASIC METABOLIC PANEL
Anion gap: 10 (ref 5–15)
BUN: 20 mg/dL (ref 8–23)
CO2: 20 mmol/L — ABNORMAL LOW (ref 22–32)
Calcium: 8.5 mg/dL — ABNORMAL LOW (ref 8.9–10.3)
Chloride: 106 mmol/L (ref 98–111)
Creatinine, Ser: 0.54 mg/dL (ref 0.44–1.00)
GFR calc Af Amer: >60 mL/min (ref >60)
GFR calc non Af Amer: >60 mL/min (ref >60)
Glucose, Bld: 94 mg/dL (ref 70–99)
Potassium: 4.2 mmol/L (ref 3.5–5.1)
Sodium: 136 mmol/L (ref 135–145)

## 2019-08-24 LAB — MAGNESIUM: Magnesium: 1.7 mg/dL (ref 1.7–2.4)

## 2019-08-24 MED ORDER — FUROSEMIDE 10 MG/ML IJ SOLN
20.0000 mg | Freq: Once | INTRAMUSCULAR | Status: AC
  Administered 2019-08-24: 20 mg via INTRAVENOUS
  Filled 2019-08-24: qty 2

## 2019-08-24 MED ORDER — DILTIAZEM HCL ER COATED BEADS 180 MG PO CP24
300.0000 mg | ORAL_CAPSULE | Freq: Every day | ORAL | Status: DC
  Administered 2019-08-24: 300 mg via ORAL
  Filled 2019-08-24: qty 1

## 2019-08-24 NOTE — Progress Notes (Signed)
Progress Note  Patient Name: Rachel Park Date of Encounter: 08/24/2019  [facility] Cardiologist: Mertie Moores, MD   Subjective   Continues to have shortness of breath.  Remains in atrial fibrillation with somewhat rapid response.  Inpatient Medications    Scheduled Meds: . apixaban  2.5 mg Oral BID  . diltiazem  120 mg Oral Q12H  . levothyroxine  75 mcg Oral QAC breakfast  . metoprolol tartrate  50 mg Oral QID  . rosuvastatin  10 mg Oral Daily   Continuous Infusions:  PRN Meds: acetaminophen, ondansetron (ZOFRAN) IV   Vital Signs    Vitals:   08/23/19 2219 08/24/19 0629 08/24/19 0632 08/24/19 0915  BP: 111/81  (!) 137/99 123/82  Pulse: 84  (!) 103 85  Resp: 18  18 18   Temp: 98 F (36.7 C)  97.6 F (36.4 C)   TempSrc: Oral  Oral   SpO2: 95%  97% 96%  Weight:  59.7 kg    Height:        Intake/Output Summary (Last 24 hours) at 08/24/2019 0918 Last data filed at 08/24/2019 0631 Gross per 24 hour  Intake 600 ml  Output 1350 ml  Net -750 ml   Last 3 Weights 08/24/2019 08/23/2019 08/22/2019  Weight (lbs) 131 lb 11.2 oz 133 lb 14.4 oz 132 lb 4.4 oz  Weight (kg) 59.739 kg 60.737 kg 60 kg      Telemetry    Atrial fibrillation personally reviewed  ECG    None new- Personally Reviewed  Physical Exam   GEN: Well nourished, well developed, in no acute distress  HEENT: normal  Neck: no JVD, carotid bruits, or masses Cardiac: Irregular, tachycardic; no murmurs, rubs, or gallops,no edema  Respiratory:  clear to auscultation bilaterally, normal work of breathing GI: soft, nontender, nondistended, + BS MS: no deformity or atrophy  Skin: warm and dry Neuro:  Strength and sensation are intact Psych: euthymic mood, full affect    Labs    High Sensitivity Troponin:   Recent Labs  Lab 08/21/19 1102 08/21/19 1300  TROPONINIHS 6 7      Chemistry Recent Labs  Lab 08/22/19 0552 08/23/19 0002 08/24/19 0707  NA 135 134* 136  K 4.1 4.3 4.2  CL 103  102 106  CO2 23 24 20*  GLUCOSE 94 109* 94  BUN 15 18 20   CREATININE 0.73 0.64 0.54  CALCIUM 8.6* 8.9 8.5*  GFRNONAA >60 >60 >60  GFRAA >60 >60 >60  ANIONGAP 9 8 10      Hematology Recent Labs  Lab 08/21/19 1102 08/22/19 0552  WBC 6.9 6.6  RBC 3.74* 3.43*  HGB 11.6* 10.6*  HCT 35.7* 32.4*  MCV 95.5 94.5  MCH 31.0 30.9  MCHC 32.5 32.7  RDW 13.8 14.0  PLT 260 239    BNPNo results for input(s): BNP, PROBNP in the last 168 hours.   DDimer No results for input(s): DDIMER in the last 168 hours.   Radiology    ECHOCARDIOGRAM COMPLETE  Result Date: 08/22/2019    ECHOCARDIOGRAM REPORT   Patient Name:   Rachel Park Date of Exam: 08/22/2019 Medical Rec #:  [id number]    Height:       62.0 in Accession #:    [id number]   Weight:       132.3 lb Date of Birth:  25-Mar-1931   BSA:          1.604 m Patient Age:    86 years  BP:           124/80 mmHg Patient Gender: F            HR:           105 bpm. Exam Location:  Inpatient Procedure: 2D Echo, 3D Echo, Color Doppler and Cardiac Doppler Indications:    I77.82 Acute systolic (congestive) heart failure  History:        Patient has prior history of Echocardiogram examinations, most                 recent 04/21/2018. CHF, CAD, COPD, Arrythmias:Atrial                 Fibrillation; Risk Factors:Dyslipidemia and Hypertension.  Sonographer:    Raquel Sarna Senior RDCS Referring Phys: [id number] [place]  1. Left ventricular ejection fraction, by estimation, is 45 to 50%. The left ventricle has mildly decreased function. The left ventricle demonstrates global hypokinesis. Left ventricular diastolic function could not be evaluated.  2. Right ventricular systolic function is mildly reduced. The right ventricular size is normal. There is mildly elevated pulmonary artery systolic pressure. The estimated right ventricular systolic pressure is 44.3 mmHg.  3. Left atrial size was severely dilated.  4. Right atrial size was moderately dilated.  5. The  mitral valve is myxomatous. Moderate mitral valve regurgitation.  6. The aortic valve is normal in structure. Aortic valve regurgitation is trivial. Mild aortic valve sclerosis is present, with no evidence of aortic valve stenosis.  7. Aortic dilatation noted. There is borderline dilatation of the ascending aorta measuring 38 mm.  8. The inferior vena cava is dilated in size with <50% respiratory variability, suggesting right atrial pressure of 15 mmHg. Comparison(s): Prior images reviewed side by side. The left ventricular function is worsened. FINDINGS  Left Ventricle: Left ventricular ejection fraction, by estimation, is 45 to 50%. The left ventricle has mildly decreased function. The left ventricle demonstrates global hypokinesis. The left ventricular internal cavity size was normal in size. There is  no left ventricular hypertrophy. Left ventricular diastolic function could not be evaluated due to atrial fibrillation. Left ventricular diastolic function could not be evaluated. Right Ventricle: The right ventricular size is normal. No increase in right ventricular wall thickness. Right ventricular systolic function is mildly reduced. There is mildly elevated pulmonary artery systolic pressure. The tricuspid regurgitant velocity  is 2.40 m/s, and with an assumed right atrial pressure of 15 mmHg, the estimated right ventricular systolic pressure is 15.4 mmHg. Left Atrium: Left atrial size was severely dilated. Right Atrium: Right atrial size was moderately dilated. Pericardium: There is no evidence of pericardial effusion. Mitral Valve: The mitral valve is myxomatous. There is mild thickening of the mitral valve leaflet(s). Mild mitral annular calcification. Moderate mitral valve regurgitation, with posteriorly-directed jet. Tricuspid Valve: The tricuspid valve is normal in structure. Tricuspid valve regurgitation is mild. Aortic Valve: The aortic valve is normal in structure. Aortic valve regurgitation is  trivial. Aortic regurgitation PHT measures 449 msec. Mild aortic valve sclerosis is present, with no evidence of aortic valve stenosis. Pulmonic Valve: The pulmonic valve was not well visualized. Pulmonic valve regurgitation is trivial. Aorta: Aortic dilatation noted. There is borderline dilatation of the ascending aorta measuring 38 mm. Venous: The inferior vena cava is dilated in size with less than 50% respiratory variability, suggesting right atrial pressure of 15 mmHg. IAS/Shunts: No atrial level shunt detected by color flow Doppler.  LEFT VENTRICLE PLAX 2D LVIDd:  3.80 cm LVIDs:         3.10 cm LV PW:         1.00 cm LV IVS:        1.00 cm LVOT diam:     2.00 cm LV SV:         40 LV SV Index:   25 LVOT Area:     3.14 cm  LV Volumes (MOD) LV vol d, MOD A2C: 56.4 ml LV vol d, MOD A4C: 79.9 ml LV vol s, MOD A2C: 30.7 ml LV vol s, MOD A4C: 40.7 ml LV SV MOD A2C:     25.7 ml LV SV MOD A4C:     79.9 ml LV SV MOD BP:      30.7 ml RIGHT VENTRICLE RV S prime:     7.62 cm/s TAPSE (M-mode): 1.4 cm LEFT ATRIUM             Index       RIGHT ATRIUM           Index LA diam:        3.00 cm 1.87 cm/m  RA Area:     21.80 cm LA Vol (A2C):   70.0 ml 43.65 ml/m RA Volume:   61.20 ml  38.16 ml/m LA Vol (A4C):   82.6 ml 51.51 ml/m LA Biplane Vol: 82.0 ml 51.13 ml/m  AORTIC VALVE LVOT Vmax:   69.70 cm/s LVOT Vmean:  49.367 cm/s LVOT VTI:    0.128 m AI PHT:      449 msec  AORTA Ao Root diam: 3.00 cm Ao Asc diam:  3.80 cm MR Peak grad:    88.0 mmHg   TRICUSPID VALVE MR Mean grad:    57.0 mmHg   TR Peak grad:   23.0 mmHg MR Vmax:         469.00 cm/s TR Vmax:        240.00 cm/s MR Vmean:        355.0 cm/s MR PISA:         2.02 cm    SHUNTS MR PISA Eff ROA: 17 mm      Systemic VTI:  0.13 m MR PISA Radius:  0.57 cm     Systemic Diam: 2.00 cm Dani Gobble Croitoru MD Electronically signed by Sanda Klein MD Signature Date/Time: 08/22/2019/2:44:17 PM    Final     Cardiac Studies   TTE 08/23/2019 1. Left ventricular ejection  fraction, by estimation, is 45 to 50%. The  left ventricle has mildly decreased function. The left ventricle  demonstrates global hypokinesis. Left ventricular diastolic function could  not be evaluated.  2. Right ventricular systolic function is mildly reduced. The right  ventricular size is normal. There is mildly elevated pulmonary artery  systolic pressure. The estimated right ventricular systolic pressure is  52.8 mmHg.  3. Left atrial size was severely dilated.  4. Right atrial size was moderately dilated.  5. The mitral valve is myxomatous. Moderate mitral valve regurgitation.  6. The aortic valve is normal in structure. Aortic valve regurgitation is  trivial. Mild aortic valve sclerosis is present, with no evidence of  aortic valve stenosis.  7. Aortic dilatation noted. There is borderline dilatation of the  ascending aorta measuring 38 mm.  8. The inferior vena cava is dilated in size with <50% respiratory  variability, suggesting right atrial pressure of 15 mmHg.   Patient Profile     86 y.o. female here with shortness of breath, atrial flutter  137 beats a minute 2-1 conduction  Assessment & Plan    Atrial fibrillation/flutter flutter with rapid ventricular response Continue to have issues with rate control.  Diltiazem was added yesterday with improved control.  Her ejection fraction is mildly reduced which could be due to a tachycardia mediated cardiomyopathy.  We Kenzlie Disch increase her diltiazem dose to hopefully improve her rate control overall.  I am concerned that she is now permanent and does not have an option for rhythm control.  She has been on a rate control strategy per her primary cardiologist.  I am concerned that with her overall rapid rates, she may continue to decline.  If rates cannot be controlled with medications, she may benefit from AV node ablation and pacemaker implant.  She Humna [place]house need EP follow-up after discharge.  Chronic anticoagulation Continue  Eliquis  Coronary artery disease Cath in 2018 with nonobstructive coronary disease and a patent LAD stent no chest pain.    Hyperlipidemia Continue Crestor  Hypothyroidism Continue Synthroid  Chronic systolic heart failure likely due to a tachycardia mediated cardiomyopathy Heart rate needs to be better controlled.  Plan to increase diltiazem to daily.  We [place] Dimmitt also redose her with Lasix as this has seemed to improve her respiratory status and the kidney function has remained stable.    Case discussed with her son Dr. Sherren Mocha Early.  For questions or updates, please contact [place] Please consult [email or web address] for contact info under        Signed, Alizaya Oshea Meredith Leeds, MD  08/24/2019, 9:18 AM

## 2019-08-25 ENCOUNTER — Ambulatory Visit: Payer: Medicare HMO | Admitting: Cardiovascular Disease

## 2019-08-25 DIAGNOSIS — E038 Other specified hypothyroidism: Secondary | ICD-10-CM

## 2019-08-25 DIAGNOSIS — I5021 Acute systolic (congestive) heart failure: Secondary | ICD-10-CM

## 2019-08-25 MED ORDER — DILTIAZEM HCL ER COATED BEADS 300 MG PO CP24: 300.0000 mg | ORAL_CAPSULE | Freq: Every day | ORAL | 2 refills | Status: DC

## 2019-08-25 MED ORDER — MAGNESIUM SULFATE 2 GM/50ML IV SOLN
2.0000 g | Freq: Once | INTRAVENOUS | Status: AC
  Administered 2019-08-25: 2 g via INTRAVENOUS
  Filled 2019-08-25: qty 50

## 2019-08-25 MED ORDER — FUROSEMIDE 20 MG PO TABS
20.0000 mg | ORAL_TABLET | Freq: Every day | ORAL | Status: DC
  Administered 2019-08-25: 20 mg via ORAL
  Filled 2019-08-25: qty 1

## 2019-08-25 MED ORDER — METOPROLOL SUCCINATE ER 100 MG PO TB24
100.0000 mg | ORAL_TABLET | Freq: Two times a day (BID) | ORAL | 2 refills | Status: DC
Start: 2019-08-25 — End: 2019-12-16

## 2019-08-25 MED ORDER — FUROSEMIDE 20 MG PO TABS: 20.0000 mg | ORAL_TABLET | Freq: Every day | ORAL | 2 refills | Status: DC

## 2019-08-25 MED ORDER — APIXABAN 2.5 MG PO TABS: 2.5000 mg | ORAL_TABLET | Freq: Two times a day (BID) | ORAL | 5 refills | Status: DC

## 2019-08-25 NOTE — Progress Notes (Signed)
Progress Note  Patient Name: Rachel Park Date of Encounter: 08/25/2019  [facility] Cardiologist: Mertie Moores, MD   Subjective   HR control appears improved overnight, now on diltiazem and metoprolol. Net negative 1.5L yesterday. Creatinine low at 0.54. Potassium stable. LVEF 45-50% by echo.  Inpatient Medications    Scheduled Meds: . apixaban  2.5 mg Oral BID  . diltiazem  300 mg Oral Daily  . levothyroxine  75 mcg Oral QAC breakfast  . metoprolol tartrate  50 mg Oral QID  . rosuvastatin  10 mg Oral Daily   Continuous Infusions:  PRN Meds: acetaminophen, ondansetron (ZOFRAN) IV   Vital Signs    Vitals:   08/24/19 1704 08/24/19 1952 08/25/19 0434 08/25/19 0833  BP: 124/79 108/74 124/82 95/62  Pulse: 77 86 75 80  Resp:  18 18   Temp:  97.7 F (36.5 C) (!) 97.4 F (36.3 C) 97.9 F (36.6 C)  TempSrc:  Oral Oral Oral  SpO2: 96% 94% 94% 98%  Weight:   59.6 kg   Height:        Intake/Output Summary (Last 24 hours) at 08/25/2019 0912 Last data filed at 08/25/2019 0855 Gross per 24 hour  Intake 840 ml  Output 2320 ml  Net -1480 ml   Last 3 Weights 08/25/2019 08/24/2019 08/23/2019  Weight (lbs) 131 lb 8 oz 131 lb 11.2 oz 133 lb 14.4 oz  Weight (kg) 59.648 kg 59.739 kg 60.737 kg      Telemetry    Atrial fibrillation with CVR - personally reviewed  ECG    N/A - Personally Reviewed  Physical Exam   General appearance: alert and no distress Neck: no carotid bruit, no JVD and thyroid not enlarged, symmetric, no tenderness/mass/nodules Lungs: clear to auscultation bilaterally Heart: irregularly irregular rhythm Abdomen: soft, non-tender; bowel sounds normal; no masses,  no organomegaly Extremities: extremities normal, atraumatic, no cyanosis or edema Pulses: 2+ and symmetric Skin: Skin color, texture, turgor normal. No rashes or lesions Neurologic: Grossly normal Psych: Pleasant   Labs    High Sensitivity Troponin:   Recent Labs  Lab 08/21/19 1102  08/21/19 1300  TROPONINIHS 6 7      Chemistry Recent Labs  Lab 08/22/19 0552 08/23/19 0002 08/24/19 0707  NA 135 134* 136  K 4.1 4.3 4.2  CL 103 102 106  CO2 23 24 20*  GLUCOSE 94 109* 94  BUN 15 18 20   CREATININE 0.73 0.64 0.54  CALCIUM 8.6* 8.9 8.5*  GFRNONAA >60 >60 >60  GFRAA >60 >60 >60  ANIONGAP 9 8 10      Hematology Recent Labs  Lab 08/21/19 1102 08/22/19 0552  WBC 6.9 6.6  RBC 3.74* 3.43*  HGB 11.6* 10.6*  HCT 35.7* 32.4*  MCV 95.5 94.5  MCH 31.0 30.9  MCHC 32.5 32.7  RDW 13.8 14.0  PLT 260 239    BNPNo results for input(s): BNP, PROBNP in the last 168 hours.   DDimer No results for input(s): DDIMER in the last 168 hours.   Radiology    No results found.  Cardiac Studies   TTE 08/23/2019 1. Left ventricular ejection fraction, by estimation, is 45 to 50%. The  left ventricle has mildly decreased function. The left ventricle  demonstrates global hypokinesis. Left ventricular diastolic function could  not be evaluated.  2. Right ventricular systolic function is mildly reduced. The right  ventricular size is normal. There is mildly elevated pulmonary artery  systolic pressure. The estimated right ventricular systolic pressure  is  38.0 mmHg.  3. Left atrial size was severely dilated.  4. Right atrial size was moderately dilated.  5. The mitral valve is myxomatous. Moderate mitral valve regurgitation.  6. The aortic valve is normal in structure. Aortic valve regurgitation is  trivial. Mild aortic valve sclerosis is present, with no evidence of  aortic valve stenosis.  7. Aortic dilatation noted. There is borderline dilatation of the  ascending aorta measuring 38 mm.  8. The inferior vena cava is dilated in size with <50% respiratory  variability, suggesting right atrial pressure of 15 mmHg.   Patient Profile     86 y.o. female here with shortness of breath, atrial flutter 137 beats a minute 2-1 conduction  Assessment & Plan      Atrial fibrillation/flutter flutter with rapid ventricular response Continue to have issues with rate control.  Diltiazem was added yesterday with improved control.  Her ejection fraction is mildly reduced which could be due to a tachycardia mediated cardiomyopathy.  We will increase her diltiazem dose to hopefully improve her rate control overall.  I am concerned that she is now permanent and does not have an option for rhythm control.  She has been on a rate control strategy per her primary cardiologist.  I am concerned that with her overall rapid rates, she may continue to decline.  If rates cannot be controlled with medications, she may benefit from AV node ablation and pacemaker implant.  She will need EP follow-up after discharge.  Chronic anticoagulation Continue reduced dose Eliquis 2.5 mg BID (weight <60 kg, Age >80)  Coronary artery disease Cath in 2018 with nonobstructive coronary disease and a patent LAD stent no chest pain.    Hyperlipidemia Continue Crestor  Hypothyroidism Continue Synthroid - free T WNL, elevated TSH.  Chronic systolic heart failure likely due to a tachycardia mediated cardiomyopathy  Heartrate control is improved - feels better today. Diuresed 1.5L negative. Creatinine stable. Start lasix 20 mg daily for maintenance at home.  Hypomagnesemia  1.7, stable - Give 2G magnesium IV today, potassium WNL  Case discussed with her son Dr. Sherren Mocha Early. I feel she could be safely discharged today.  For questions or updates, please contact [place] Please consult [email or web address] for contact info under   Pixie Casino, MD, FACC, [place] Director of the [facility] Diplomate of the American Board of Clinical Lipidology Attending Cardiologist  Direct Dial: [phone]  Fax: [phone]  Website:  [email or web address]  Pixie Casino, MD  08/25/2019, 9:12 AM

## 2019-08-25 NOTE — Progress Notes (Signed)
   08/25/19 0811  Assess: MEWS Score  ECG Heart Rate (!) 116  Assess: MEWS Score  MEWS Temp 0  MEWS Systolic 0  MEWS Pulse 2  MEWS RR 0  MEWS LOC 0  MEWS Score 2  MEWS Score Color Yellow  Assess: if the MEWS score is Yellow or Red  Were vital signs taken at a resting state? Yes  Focused Assessment Documented focused assessment  MEWS guidelines implemented *See Row Information* No, previously yellow, continue vital signs every 4 hours   - Pt is ambulating to the bathroom when HR increased - HR went back within normal limits when pt went back to bed - Scheduled meds given

## 2019-08-25 NOTE — Progress Notes (Signed)
RN reviewed fall safety guidelines with the pt.  Pt alert and oriented x4 and refusing bed alarm during this time.

## 2019-08-25 NOTE — Progress Notes (Signed)
  Mobility Specialist Criteria Algorithm Info.  SATURATION QUALIFICATIONS: (This note is used to comply with regulatory documentation for home oxygen)  Patient Saturations on Room Air at Rest = 97%  Patient Saturations on Room Air while Ambulating = 93%  Patient Saturations on N/A Liters of oxygen while Ambulating = N/A%  Please briefly explain why patient needs home oxygen:  Mobility Team:  [facility] elevated:Self regulated Activity: Ambulated in hall Range of motion: Active;All extremities Level of assistance: Independent Assistive device: None Minutes sitting in chair:  Minutes stood: 5 minutes Minutes ambulated: 5 minutes Distance ambulated (ft): 1260 ft Mobility response: Tolerated well Bed Position: Semi-fowlers  Pt ambulated in hallway with no complaints. Per patient, SOB has improved significantly and is tolerating ambulation better.  08/25/2019 11:01 AM

## 2019-08-25 NOTE — Discharge Summary (Signed)
Discharge Summary    Patient ID: Rachel Park MRN: [id number]; DOB: 05-31-1931  Admit date: 08/21/2019 Discharge date: 08/25/2019  Primary Care Provider: Lajean Manes, MD  Primary Cardiologist: Mertie Moores, MD  Primary Electrophysiologist:  Constance Haw, MD   Discharge Diagnoses    Principal Problem:   Persistent atrial fibrillation/flutter Active Problems:   CAD (coronary artery disease)   Hypothyroidism   Hyperlipidemia   Chronic systolic CHF (congestive heart failure) ([place])   Hypomagnesemia    Diagnostic Studies/Procedures    Echocardiogram 08/22/2019: Impressions: 1. Left ventricular ejection fraction, by estimation, is 45 to 50%. The  left ventricle has mildly decreased function. The left ventricle  demonstrates global hypokinesis. Left ventricular diastolic function could  not be evaluated.  2. Right ventricular systolic function is mildly reduced. The right  ventricular size is normal. There is mildly elevated pulmonary artery  systolic pressure. The estimated right ventricular systolic pressure is  38.1 mmHg.  3. Left atrial size was severely dilated.  4. Right atrial size was moderately dilated.  5. The mitral valve is myxomatous. Moderate mitral valve regurgitation.  6. The aortic valve is normal in structure. Aortic valve regurgitation is  trivial. Mild aortic valve sclerosis is present, with no evidence of  aortic valve stenosis.  7. Aortic dilatation noted. There is borderline dilatation of the  ascending aorta measuring 38 mm.  8. The inferior vena cava is dilated in size with <50% respiratory  variability, suggesting right atrial pressure of 15 mmHg.   Comparison(s): Prior images reviewed side by side. The left ventricular  function is worsened. _____________   History of Present Illness     Rachel Park is a 86 y.o. female with CAD, paroxysmal atrial fibrillation on Eliquis, prior stroke, hypertension, and hypothyroidism who  presented on 08/21/2019 with shortness of breath. Patient reported this had been going on for 4 days prior to presentation. She had not been sleeping well because of this. She took Lasix day before presentation without any significant improvement. She also reported a cough and said she could her herself wheezing when she sat quietly. Her abdomen seemed distended and she reported a 6 lb weight gain since 08/11/2019. She denied any chest pain but did report some mild discomfort below her bra line bilaterally.   In the ED, patient tachycardic, tachypneic and hypertensive. EKG showed atrial flutter, rate 137 bpm, with 2:1 AV conduction. Chest x-ray showed bilateral small effusions with mild basilar atelectasis (similar to prior imaging). High-sensitivity troponin negative x2. WBC 6.9, Hgb 11.6, Plts 260. Na 136, K 3.9, Glucose 104, BUN 11, Cr 0.68. Magnesium 1.6. COVID-19 negative. Patient was admitted for atrial flutter with RVR.  Hospital Course     Consultants: None  Persistent Atrial Fibrillation/Flutter with RVR Patient admitted with atrial flutter with RVR as stated above. Potassium was normal. Magnesium 1.6 on presentation and was supplemented. TSH elevated was elevated at 11.65 but free T4 normal. She has failed Tikosyn in the past and plan has been for rate control in outpatient setting. Patient's home Lopressor was increased to 50mg  every 6 hours and Cardizem was added and uptitrated with improvement in rates. Will consolidate metoprolol at discharge and send her home on Toprol-XL 100mg  twice daily. Will also be discharged on Cardizem 300mg  daily. Eliquis was decreased to 2.5mg  twice daily given weight <60kg and age >10). Will arrange follow-up with EP after discharge. If rates cannot be controlled with medications, she may benefit from AV node ablation and pacemaker implant.  Chronic Systolic CHF Echo this admission showed LVEF of 45 to 50% with global hypokinesis. RV normal size with mildly reduced  systolic function. 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 Patient diuresed with IV Lasix. Diuresed 1.5L this admission. Discharge weight 131 lbs. Will discharge on Lasix 20mg  daily. Continue beta-blocker as above. Will hold off on ACEi/ARB given soft BP at times. Can consider adding at outpatient follow-up. Will need repeat BMET at EP follow-up.  CAD Cardiac cath in 2018 showed non-obstructive CAD and patent LAD stent. Patient denies any angina. Continue secondary prevention. Continue statin and beta-blocker. No aspirin due to need for anticoagulation.  Hyperlipidemia Continue home Crestor.  Hypothyroidism TSH elevated at 11.65 but free T4 normal. Continue home dose of Synthroid. Follow-up with PCP.   Hypomagnesemia Magnesium 1.6 on presentation and 1.7 today. Will supplement 2g of magnesium sulfate prior to discharge. Potassium within normal limits.  Patient seen and examined by Dr. Debara Pickett today and determined to be stable for discharge. Outpatient follow-up has been arranged. Medications as below.  Did the patient have an acute coronary syndrome (MI, NSTEMI, STEMI, etc) this admission?:  No                               Did the patient have a percutaneous coronary intervention (stent / angioplasty)?:  No.   _____________  Discharge Vitals Blood pressure 95/62, pulse 80, temperature 97.9 F (36.6 C), temperature source Oral, resp. rate 18, height 5\' 2"  (1.575 m), weight 59.6 kg, SpO2 98 %.  Filed Weights   08/23/19 0055 08/24/19 0629 08/25/19 0434  Weight: 60.7 kg 59.7 kg 59.6 kg    Labs & Radiologic Studies    CBC No results for input(s): WBC, NEUTROABS, HGB, HCT, MCV, PLT in the last 72 hours. Basic Metabolic Panel Recent Labs    08/23/19 0002 08/24/19 0707  NA 134* 136  K 4.3 4.2  CL 102 106  CO2 24 20*  GLUCOSE 109* 94  BUN 18 20  CREATININE 0.64 0.54  CALCIUM 8.9 8.5*  MG  --  1.7   Liver Function Tests No results for input(s): AST, ALT, ALKPHOS,  BILITOT, PROT, ALBUMIN in the last 72 hours. No results for input(s): LIPASE, AMYLASE in the last 72 hours. High Sensitivity Troponin:   Recent Labs  Lab 08/21/19 1102 08/21/19 1300  TROPONINIHS 6 7    BNP Invalid input(s): POCBNP D-Dimer No results for input(s): DDIMER in the last 72 hours. Hemoglobin A1C No results for input(s): HGBA1C in the last 72 hours. Fasting Lipid Panel No results for input(s): CHOL, HDL, LDLCALC, TRIG, CHOLHDL, LDLDIRECT in the last 72 hours. Thyroid Function Tests No results for input(s): TSH, T4TOTAL, T3FREE, THYROIDAB in the last 72 hours.  Invalid input(s): FREET3 _____________  DG Chest 2 View  Result Date: 08/21/2019 CLINICAL DATA:  Increased short of breath.  Cough EXAM: CHEST - 2 VIEW COMPARISON:  08/02/2018 FINDINGS: Normal cardiac silhouette. Bilateral small effusions similar comparison exam. Mild basilar atelectasis. No pulmonary edema. No pneumothorax. No focal infiltrate. IMPRESSION: Bilateral small effusions and mild basilar atelectasis are similar to comparison exam. Electronically Signed   By: Suzy Bouchard M.D.   On: 08/21/2019 11:46   ECHOCARDIOGRAM COMPLETE  Result Date: 08/22/2019    ECHOCARDIOGRAM REPORT   Patient Name:   Rachel Park Date of Exam: 08/22/2019 Medical Rec #:  [id number]    Height:       62.0 in Accession #:  [id number]   Weight:       132.3 lb Date of Birth:  06-Mar-1932   BSA:          1.604 m Patient Age:    35 years     BP:           124/80 mmHg Patient Gender: F            HR:           105 bpm. Exam Location:  Inpatient Procedure: 2D Echo, 3D Echo, Color Doppler and Cardiac Doppler Indications:    J67.34 Acute systolic (congestive) heart failure  History:        Patient has prior history of Echocardiogram examinations, most                 recent 04/21/2018. CHF, CAD, COPD, Arrythmias:Atrial                 Fibrillation; Risk Factors:Dyslipidemia and Hypertension.  Sonographer:    Raquel Sarna Senior RDCS Referring Phys:  [id number] [place]  1. Left ventricular ejection fraction, by estimation, is 45 to 50%. The left ventricle has mildly decreased function. The left ventricle demonstrates global hypokinesis. Left ventricular diastolic function could not be evaluated.  2. Right ventricular systolic function is mildly reduced. The right ventricular size is normal. There is mildly elevated pulmonary artery systolic pressure. The estimated right ventricular systolic pressure is 40.9 mmHg.  3. Left atrial size was severely dilated.  4. Right atrial size was moderately dilated.  5. The mitral valve is myxomatous. Moderate mitral valve regurgitation.  6. The aortic valve is normal in structure. Aortic valve regurgitation is trivial. Mild aortic valve sclerosis is present, with no evidence of aortic valve stenosis.  7. Aortic dilatation noted. There is borderline dilatation of the ascending aorta measuring 38 mm.  8. The inferior vena cava is dilated in size with <50% respiratory variability, suggesting right atrial pressure of 15 mmHg. Comparison(s): Prior images reviewed side by side. The left ventricular function is worsened. FINDINGS  Left Ventricle: Left ventricular ejection fraction, by estimation, is 45 to 50%. The left ventricle has mildly decreased function. The left ventricle demonstrates global hypokinesis. The left ventricular internal cavity size was normal in size. There is  no left ventricular hypertrophy. Left ventricular diastolic function could not be evaluated due to atrial fibrillation. Left ventricular diastolic function could not be evaluated. Right Ventricle: The right ventricular size is normal. No increase in right ventricular wall thickness. Right ventricular systolic function is mildly reduced. There is mildly elevated pulmonary artery systolic pressure. The tricuspid regurgitant velocity  is 2.40 m/s, and with an assumed right atrial pressure of 15 mmHg, the estimated right ventricular systolic  pressure is 73.5 mmHg. Left Atrium: Left atrial size was severely dilated. Right Atrium: Right atrial size was moderately dilated. Pericardium: There is no evidence of pericardial effusion. Mitral Valve: The mitral valve is myxomatous. There is mild thickening of the mitral valve leaflet(s). Mild mitral annular calcification. Moderate mitral valve regurgitation, with posteriorly-directed jet. Tricuspid Valve: The tricuspid valve is normal in structure. Tricuspid valve regurgitation is mild. Aortic Valve: The aortic valve is normal in structure. Aortic valve regurgitation is trivial. Aortic regurgitation PHT measures 449 msec. Mild aortic valve sclerosis is present, with no evidence of aortic valve stenosis. Pulmonic Valve: The pulmonic valve was not well visualized. Pulmonic valve regurgitation is trivial. Aorta: Aortic dilatation noted. There is borderline dilatation of the ascending aorta measuring 38 mm.  Venous: The inferior vena cava is dilated in size with less than 50% respiratory variability, suggesting right atrial pressure of 15 mmHg. IAS/Shunts: No atrial level shunt detected by color flow Doppler.  LEFT VENTRICLE PLAX 2D LVIDd:         3.80 cm LVIDs:         3.10 cm LV PW:         1.00 cm LV IVS:        1.00 cm LVOT diam:     2.00 cm LV SV:         40 LV SV Index:   25 LVOT Area:     3.14 cm  LV Volumes (MOD) LV vol d, MOD A2C: 56.4 ml LV vol d, MOD A4C: 79.9 ml LV vol s, MOD A2C: 30.7 ml LV vol s, MOD A4C: 40.7 ml LV SV MOD A2C:     25.7 ml LV SV MOD A4C:     79.9 ml LV SV MOD BP:      30.7 ml RIGHT VENTRICLE RV S prime:     7.62 cm/s TAPSE (M-mode): 1.4 cm LEFT ATRIUM             Index       RIGHT ATRIUM           Index LA diam:        3.00 cm 1.87 cm/m  RA Area:     21.80 cm LA Vol (A2C):   70.0 ml 43.65 ml/m RA Volume:   61.20 ml  38.16 ml/m LA Vol (A4C):   82.6 ml 51.51 ml/m LA Biplane Vol: 82.0 ml 51.13 ml/m  AORTIC VALVE LVOT Vmax:   69.70 cm/s LVOT Vmean:  49.367 cm/s LVOT VTI:    0.128 m  AI PHT:      449 msec  AORTA Ao Root diam: 3.00 cm Ao Asc diam:  3.80 cm MR Peak grad:    88.0 mmHg   TRICUSPID VALVE MR Mean grad:    57.0 mmHg   TR Peak grad:   23.0 mmHg MR Vmax:         469.00 cm/s TR Vmax:        240.00 cm/s MR Vmean:        355.0 cm/s MR PISA:         2.02 cm    SHUNTS MR PISA Eff ROA: 17 mm      Systemic VTI:  0.13 m MR PISA Radius:  0.57 cm     Systemic Diam: 2.00 cm Dani Gobble Croitoru MD Electronically signed by Sanda Klein MD Signature Date/Time: 08/22/2019/2:44:17 PM    Final    Disposition   Patient is being discharged home today in good condition.  Follow-up Plans & Appointments     Follow-up Information    Nahser, Wonda Cheng, MD Follow up.   Specialty: Cardiology Why: Hospital follow-up scheduled for 10/06/2019 at 4pm with Dr. Acie Fredrickson. Contact information: [address] [place] [address] [phone]        Baldwin Jamaica, PA-C Follow up.   Specialty: Cardiology Why: Hospital follow-up scheduled for 09/12/2019 at 1pm with Tommye Standard, one of the PAs in our EP clinic.  Contact information: [address] [place] [address] [phone]        Lajean Manes, MD Follow up.   Specialty: Internal Medicine Why: The office will call patient. Contact information: [address] [place] [address] [phone]  Discharge Instructions    Diet - low sodium heart healthy   Complete by: As directed    Increase activity slowly   Complete by: As directed       Discharge Medications   Allergies as of 08/25/2019      Reactions   Propofol Nausea And Vomiting   "with hysterectomy years back had post op N/V"      Medication List    STOP taking these medications   metoprolol tartrate 25 MG tablet Commonly known as: LOPRESSOR     TAKE these medications   acetaminophen 500 MG tablet Commonly known as: TYLENOL Take 1,000 mg by mouth every 6 (six) hours as needed for mild pain.     apixaban 2.5 MG Tabs tablet Commonly known as: ELIQUIS Take 1 tablet (2.5 mg total) by mouth 2 (two) times daily. What changed:   medication strength  how much to take   CALCIUM + D3 PO Take 1 tablet by mouth daily at 12 noon.   diltiazem 300 MG 24 hr capsule Commonly known as: CARDIZEM CD Take 1 capsule (300 mg total) by mouth daily.   furosemide 20 MG tablet Commonly known as: LASIX Take 1 tablet (20 mg total) by mouth daily. What changed: how much to take   levothyroxine 75 MCG tablet Commonly known as: SYNTHROID Take 75 mcg by mouth daily before breakfast.   metoprolol succinate 100 MG 24 hr tablet Commonly known as: Toprol XL Take 1 tablet (100 mg total) by mouth 2 (two) times daily. Take with or immediately following a meal.   multivitamin with minerals Tabs tablet Take 1 tablet by mouth daily.   Nitrostat 0.4 MG SL tablet Generic drug: nitroGLYCERIN Place 1 tablet (0.4 mg total) under the tongue every 5 (five) minutes as needed. Chest pain   rosuvastatin 10 MG tablet Commonly known as: CRESTOR Take 1 tablet by mouth once daily   Systane Ultra 0.4-0.3 % Soln Generic drug: Polyethyl Glycol-Propyl Glycol Place 1-2 drops into both eyes every other day.   Vitamin D3 50 MCG (2000 UT) Tabs Take 2,000 Units by mouth daily with lunch.          Outstanding Labs/Studies   BMET at follow-up with EP on 09/12/2019.  Duration of Discharge Encounter   Greater than 30 minutes including physician time.  Signed, Darreld Mclean, PA-C 08/25/2019, 12:42 PM

## 2019-08-25 NOTE — Discharge Instructions (Addendum)
Medication Changes: - STOP metoprolol tartrate (Lopressor) and START metoprolol succinate (Toprol-XL) 100mg  twice daily. - START Diltiazem (Cardizem CD) 300mg  daily.  - DECREASE Eliquis to 2.5mg  twice daily (you can split and take half of your 5mg  tablet until they run out). - INCREASE Lasix to 20mg  daily.    Information on my medicine - ELIQUIS (apixaban)  This medication education was reviewed with me or my healthcare representative as part of my discharge preparation.    Why was Eliquis prescribed for you? Eliquis was prescribed for you to reduce the risk of a blood clot forming that can cause a stroke if you have a medical condition called atrial fibrillation (a type of irregular heartbeat).  What do You need to know about Eliquis ? Take your Eliquis TWICE DAILY - one tablet in the morning and one tablet in the evening with or without food. If you have difficulty swallowing the tablet whole please discuss with your pharmacist how to take the medication safely.  Take Eliquis exactly as prescribed by your doctor and DO NOT stop taking Eliquis without talking to the doctor who prescribed the medication.  Stopping may increase your risk of developing a stroke.  Refill your prescription before you run out.  After discharge, you should have regular check-up appointments with your healthcare provider that is prescribing your Eliquis.  In the future your dose may need to be changed if your kidney function or weight changes by a significant amount or as you get older.  What do you do if you miss a dose? If you miss a dose, take it as soon as you remember on the same day and resume taking twice daily.  Do not take more than one dose of ELIQUIS at the same time to make up a missed dose.  Important Safety Information A possible side effect of Eliquis is bleeding. You should call your healthcare provider right away if you experience any of the following: ? Bleeding from an injury or your  nose that does not stop. ? Unusual colored urine (red or dark brown) or unusual colored stools (red or black). ? Unusual bruising for unknown reasons. ? A serious fall or if you hit your head (even if there is no bleeding).  Some medicines may interact with Eliquis and might increase your risk of bleeding or clotting while on Eliquis. To help avoid this, consult your healthcare provider or pharmacist prior to using any new prescription or non-prescription medications, including herbals, vitamins, non-steroidal anti-inflammatory drugs (NSAIDs) and supplements.  This website has more information on Eliquis (apixaban): http://www.eliquis.com/eliquis/home

## 2019-08-29 DIAGNOSIS — R350 Frequency of micturition: Secondary | ICD-10-CM | POA: Diagnosis not present

## 2019-08-29 DIAGNOSIS — I10 Essential (primary) hypertension: Secondary | ICD-10-CM | POA: Diagnosis not present

## 2019-08-29 DIAGNOSIS — R69 Illness, unspecified: Secondary | ICD-10-CM | POA: Diagnosis not present

## 2019-08-29 DIAGNOSIS — I480 Paroxysmal atrial fibrillation: Secondary | ICD-10-CM | POA: Diagnosis not present

## 2019-08-29 DIAGNOSIS — D6869 Other thrombophilia: Secondary | ICD-10-CM | POA: Diagnosis not present

## 2019-09-02 ENCOUNTER — Ambulatory Visit: Payer: Medicare HMO | Admitting: Cardiovascular Disease

## 2019-09-11 NOTE — Progress Notes (Signed)
Cardiology Office Note Date:  09/12/2019  Patient ID:  Rachel Park 1931/10/19, MRN [id number] PCP:  Lajean Manes, MD  Cardiologist:  Dr. Acie Fredrickson EP: Dr. Curt Bears    Chief Complaint: post hospital  History of Present Illness: Rachel Park is a 86 y.o. female with history of CAD (PCI to LAD, cath 2018 patent with stable CAD), stroke, AFib, HTN, Hypothyroidism.  She was admitted march 2020 for Tikosyn initiation, DCCV during this admission restrored SR though she had ERAF the same day.  Notes discuss rate control meds not pused with known sinus rate 50's.  She discharged in [place]. She was in Afib at her 1 week f/u Arrived to [place] 05/30/2019 in SR  She had AFib during a prolonged hospital stay with an incarcerated/ischemic bowel May/June 2020 Shew had an RUE DVT catheter related, maintained on Eliquis  She has struggled with rate control with rate/rhythm control.  Jan 2021 cards note mentions that her amiodarone was stopped with transition to rate control strategy Poorly tolerated rate control drugs with fatigue prompting discontinuation of her metoprolol with resultant RVR and resumption of her BB  Admitted 08/21/2019 with progressive SOB, bloating noted RVR and volume OL Discharged 08/25/19 Diuresed and rate control meds titrated Left on Toprol XL 100mg  BID and Dilt 300mg  daily, discharge vitals note HR 80 Eliquis dose adjusted as well for age/weight. during her stay, Dr. Curt Bears saw her His thoughts were: Her ejection fraction is mildly reduced which could be due to a tachycardia mediated cardiomyopathy.  We will increase her diltiazem dose to hopefully improve her rate control overall.  I am concerned that she is now permanent and does not have an option for rhythm control.  She has been on a rate control strategy per her primary cardiologist.  I am concerned that with her overall rapid rates, she may continue to decline.  If rates cannot be controlled with medications, she may  benefit from AV node ablation and pacemaker implant   TODAY Unfortunately she reports that she never really felt better after her hospital stay, left feeling very similar to when she went in. She reports years of chest tightness, this dates back to the time of her last cath in 2018 with stable CAD, and "noone realll knows what it is".   There has been no change in this, no escalation in it's behavior. She says that she continued to feel SOB going home from the hospital though her swelling had improved dramatically. She continued to have a sense of SOB and continued to get winded easily.  Though would settle down and do OK. Yesterday she started to have increased SOB even at rest and last night unable to get any sleep could not get comfortable enough because of SOB, this has persisted today, feels breathless, even at rest. No pvert palpitations, no dizzy spells, no near syncope or syncope. No bleeding or signs of bleeding, reports compliance with her Eliquis.  She brings her home vitals. HR is done via her BP cuff and her pulse OX and generally seem to correlate well. Until yesterday her HR looked OK, 70's-90's, pulse ox on a couple of occassions read higher. Both starting yesterday 120's Her O2 sats and BP's have been good  Her d/c weight 131, today here is 134 She weighs at home and states has been [id number], but this AM 129, she felt was a mistake  AAD hx Sotalol failed to maintain SR/control rates > stopped March 2020 Tikosyn started march  2020 had ERAF and stopped only weeks later Amiodarone started April 2020 >> stopped Jan 2021 with failure to maintain SR  Past Medical History:  Diagnosis Date  . Arthritis   . Complication of anesthesia   . Coronary artery disease   . Hypertension   . Hypothyroidism   . PAF (paroxysmal atrial fibrillation) ([place])    on Xarelto for maybe a year, opted to stop  . PONV (postoperative nausea and vomiting)   . Renal infarction ([place])   . Small bowel  obstruction ([place]) 06/2015  . Stroke [facility])    oct 2016  . TIA (transient ischemic attack) 03/24/2015  . Vertigo, benign positional    to be evaluated, intermittent    Past Surgical History:  Procedure Laterality Date  . ABDOMINAL HYSTERECTOMY    . APPLICATION OF WOUND VAC N/A 07/23/2018   Procedure: APPLICATION OF WOUND VAC;  Surgeon: Rolm Bookbinder, MD;  Location: [place];  Service: General;  Laterality: N/A;  . BOWEL RESECTION N/A 07/23/2018   Procedure: SMALL BOWEL RESECTION;  Surgeon: Rolm Bookbinder, MD;  Location: [place];  Service: General;  Laterality: N/A;  . CARDIAC CATHETERIZATION N/A 12/24/2014   Procedure: Left Heart Cath and Coronary Angiography;  Surgeon: Sherren Mocha, MD;  Location: [place] CV LAB;  Service: Cardiovascular;  Laterality: N/A;  . CARDIOVERSION N/A 05/22/2018   Procedure: CARDIOVERSION;  Surgeon: Josue Hector, MD;  Location: [facility] ENDOSCOPY;  Service: Cardiovascular;  Laterality: N/A;  . CATARACT EXTRACTION Bilateral   . COLONOSCOPY WITH PROPOFOL N/A 12/01/2013   Procedure: COLONOSCOPY WITH PROPOFOL;  Surgeon: Garlan Fair, MD;  Location: [facility] ENDOSCOPY;  Service: Endoscopy;  Laterality: N/A;  . COMPLEX WOUND CLOSURE  07/25/2018   Procedure: Complex Wound Closure;  Surgeon: Rolm Bookbinder, MD;  Location: [place];  Service: General;;  . Pennie Rushing  07/25/2018   Procedure: Ileocecetomy;  Surgeon: Rolm Bookbinder, MD;  Location: [place];  Service: General;;  . INCISIONAL HERNIA REPAIR N/A 03/10/2016   Procedure: INCISIONAL HERNIA REPAIR TIMES TWO;  Surgeon: Armandina Gemma, MD;  Location: [place];  Service: General;  Laterality: N/A;  . INGUINAL HERNIA REPAIR Bilateral 03/10/2016   Procedure: BILATERAL INGUINAL HERNIA REPAIRS;  Surgeon: Armandina Gemma, MD;  Location: [place];  Service: General;  Laterality: Bilateral;  . INSERTION OF MESH N/A 03/10/2016   Procedure: INSERTION OF MESH TO BILATERAL GROINS AND ABDOMEN;  Surgeon: Armandina Gemma, MD;  Location: [place];   Service: General;  Laterality: N/A;  . laparotomy  06/2015  . LAPAROTOMY N/A 07/23/2018   Procedure: EXPLORATORY LAPAROTOMY;  Surgeon: Rolm Bookbinder, MD;  Location: [place];  Service: General;  Laterality: N/A;  . LEFT HEART CATH AND CORONARY ANGIOGRAPHY N/A 09/27/2016   Procedure: Left Heart Cath and Coronary Angiography;  Surgeon: Sherren Mocha, MD;  Location: [place] CV LAB;  Service: Cardiovascular;  Laterality: N/A;  . TONSILLECTOMY      Current Outpatient Medications  Medication Sig Dispense Refill  . acetaminophen (TYLENOL) 500 MG tablet Take 1,000 mg by mouth every 6 (six) hours as needed for mild pain.     Marland Kitchen apixaban (ELIQUIS) 2.5 MG TABS tablet Take 1 tablet (2.5 mg total) by mouth 2 (two) times daily. 60 tablet 5  . Calcium Carb-Cholecalciferol (CALCIUM + D3 PO) Take 1 tablet by mouth daily at 12 noon.    . Cholecalciferol (VITAMIN D3) 50 MCG (2000 UT) TABS Take 2,000 Units by mouth daily with lunch.    . diltiazem (CARDIZEM CD) 300 MG  24 hr capsule Take 1 capsule (300 mg total) by mouth daily. 30 capsule 2  . furosemide (LASIX) 20 MG tablet Take 1 tablet (20 mg total) by mouth daily. 30 tablet 2  . levothyroxine (SYNTHROID, LEVOTHROID) 75 MCG tablet Take 75 mcg by mouth daily before breakfast.    . metoprolol succinate (TOPROL XL) 100 MG 24 hr tablet Take 1 tablet (100 mg total) by mouth 2 (two) times daily. Take with or immediately following a meal. 60 tablet 2  . Multiple Vitamin (MULTIVITAMIN WITH MINERALS) TABS tablet Take 1 tablet by mouth daily.    Marland Kitchen NITROSTAT 0.4 MG SL tablet Place 1 tablet (0.4 mg total) under the tongue every 5 (five) minutes as needed. Chest pain 25 tablet 6  . Polyethyl Glycol-Propyl Glycol (SYSTANE ULTRA) 0.4-0.3 % SOLN Place 1-2 drops into both eyes every other day.     . rosuvastatin (CRESTOR) 10 MG tablet Take 1 tablet by mouth once daily (Patient taking differently: Take 10 mg by mouth daily. ) 90 tablet 3  . sertraline (ZOLOFT) 25 MG tablet  Take 25 mg by mouth daily.     No current facility-administered medications for this visit.    Allergies:   Propofol   Social History:  The patient  reports that she has never smoked. She has never used smokeless tobacco. She reports previous alcohol use. She reports that she does not use drugs.   Family History:  The patient's family history includes Cancer in her sister; Heart attack (age of onset: 49) in her father; Stroke in her mother.  ROS:  Please see the history of present illness.  All other systems are reviewed and otherwise negative.   PHYSICAL EXAM:  VS:  BP 118/84   Pulse (!) 56   Ht 5\' 2"  (1.575 m)   Wt 134 lb (60.8 kg)   SpO2 96%   BMI 24.51 kg/m  BMI: Body mass index is 24.51 kg/m. Well nourished, well developed, in no acute distress  HEENT: normocephalic, atraumatic  Neck: no JVD, carotid bruits or masses Cardiac:  irreg-irreg, tachycardic; no significant murmurs, no rubs, or gallops Lungs:  Slightly diminished at the bases, clear otherwisel, no wheezing, rhonchi or rales  Abd: soft, nontender MS: no deformity, age appropriate atrophy Ext: trace if any edema  Skin: warm and dry, no rash Neuro:  No gross deficits appreciated Psych: euthymic mood, full affect   EKG:  Done today and reviewed by myself shows  Afib 110bpm, no ST/T changes   Echocardiogram 08/22/2019: Impressions: 1. Left ventricular ejection fraction, by estimation, is 45 to 50%. The  left ventricle has mildly decreased function. The left ventricle  demonstrates global hypokinesis. Left ventricular diastolic function could  not be evaluated.  2. Right ventricular systolic function is mildly reduced. The right  ventricular size is normal. There is mildly elevated pulmonary artery  systolic pressure. The estimated right ventricular systolic pressure is  79.8 mmHg.  3. Left atrial size was severely dilated.  4. Right atrial size was moderately dilated.  5. The mitral valve is  myxomatous. Moderate mitral valve regurgitation.  6. The aortic valve is normal in structure. Aortic valve regurgitation is  trivial. Mild aortic valve sclerosis is present, with no evidence of  aortic valve stenosis.  7. Aortic dilatation noted. There is borderline dilatation of the  ascending aorta measuring 38 mm.  8. The inferior vena cava is dilated in size with <50% respiratory  variability, suggesting right atrial pressure of 15 mmHg.  Echocardiogram 04/21/2018: 1. The left ventricle has normal systolic function, with an ejection  fraction of 55-60%. The cavity size was normal. Mild basal septal  hypertrophy. Left ventricular diastology could not be evaluated secondary  to atrial fibrillation.  2. The right ventricle has normal systolic function. The cavity was  normal. There is no increase in right ventricular wall thickness.  3. Left atrial size was moderately dilated.  4. The mitral valve is normal in structure.  5. The tricuspid valve is normal in structure. Tricuspid valve  regurgitation is mild-moderate.  6. The aortic valve is tricuspid Mild sclerosis of the aortic valve.  7. The pulmonic valve was normal in structure. Pulmonic valve  regurgitation is mild by color flow Doppler.  8. Right atrial pressure is estimated at 3 mmHg.   Cardiac catheterization 09/27/2016: 1. Single-vessel coronary artery disease with patency of the proximal LAD stent and mild nonobstructive stenosis in the mid vessel 2. Widely patent left main, left circumflex, and RCA with no significant obstructive lesions. 3. Normal LV function with estimated LVEF 55-65%   Recent Labs: 06/16/2019: ALT 14 08/21/2019: TSH 11.653 08/22/2019: Hemoglobin 10.6; Platelets 239 08/24/2019: BUN 20; Creatinine, Ser 0.54; Magnesium 1.7; Potassium 4.2; Sodium 136  08/22/2019: Cholesterol 85; HDL 45; LDL Cholesterol 29; Total CHOL/HDL Ratio 1.9; Triglycerides 53; VLDL 11   Estimated Creatinine Clearance:  44.2 mL/min (by C-G formula based on SCr of 0.54 mg/dL).   Wt Readings from Last 3 Encounters:  09/12/19 134 lb (60.8 kg)  08/25/19 131 lb 8 oz (59.6 kg)  08/11/19 126 lb 8 oz (57.4 kg)     Other studies reviewed: Additional studies/records reviewed today include: summarized above  ASSESSMENT AND PLAN:  1. Longstanding persistent >> permanent AFib     CHA2DS2Vasc is 8, on Eliquis, appropriately dosed (her weight 60.8kg likely 2/2 volume OL today)     Rate had been largely better until yesterday particularly  2. CM     Suspect tachy-mediated     Increased SOB, last night with some orthopnea as well  3. CAD     No new symptoms     She reports chronic element of chest tightness that has been unchanged for years (?h  r AF)  4. HTN     Looks OK, should tolerate more diuresis  I have reviewed the case with Dr. Tamala Julian, suspect she did not get to her dry weight at her hospital stay and volume OL is driving her rate. Take lasix 40mg  when she gets home (total 60mg  today) and 40mg  daily, add K+ 39meq daily BMET today  Follow up next week   Dr. Tamala Julian noted that during her hospital stay her TSH was elevated, not addressed today, will forward my note to her PMD for synthroid/thyroid management   Disposition: F/u has been made with Dr. Curt Bears on Tuesday.  Should she not have improvement or should she feel worse, she was advised to go to the hospital.  Current medicines are reviewed at length with the patient today.  The patient did not have any concerns regarding medicines.  Venetia Night, PA-C 09/12/2019 5:43 PM     [place] [address] [phone] (office)  [phone] (fax)

## 2019-09-12 ENCOUNTER — Other Ambulatory Visit: Payer: Self-pay

## 2019-09-12 ENCOUNTER — Ambulatory Visit: Payer: Medicare HMO | Admitting: Physician Assistant

## 2019-09-12 VITALS — BP 118/84 | HR 56 | Ht 62.0 in | Wt 134.0 lb

## 2019-09-12 DIAGNOSIS — I428 Other cardiomyopathies: Secondary | ICD-10-CM | POA: Diagnosis not present

## 2019-09-12 DIAGNOSIS — I4811 Longstanding persistent atrial fibrillation: Secondary | ICD-10-CM

## 2019-09-12 DIAGNOSIS — I2510 Atherosclerotic heart disease of native coronary artery without angina pectoris: Secondary | ICD-10-CM

## 2019-09-12 DIAGNOSIS — I10 Essential (primary) hypertension: Secondary | ICD-10-CM

## 2019-09-12 DIAGNOSIS — Z79899 Other long term (current) drug therapy: Secondary | ICD-10-CM

## 2019-09-12 NOTE — Patient Instructions (Signed)
Medication Instructions:   START TAKING  LASIX 40 MG ONCE A DAY   ( TAKE 40 MG TODAY WHEN YOU GET HOME TODAY TO HAVE A TOTAL OF 60 MG )  *If you need a refill on your cardiac medications before your next appointment, please call your pharmacy*   Lab Work: BMET TODAY   If you have labs (blood work) drawn today and your tests are completely normal, you will receive your results only by: Marland Kitchen MyChart Message (if you have MyChart) OR . A paper copy in the mail If you have any lab test that is abnormal or we need to change your treatment, we will call you to review the results.   Testing/Procedures: NONE ORDERED  TODAY   Follow-Up: At [facility], you and your health needs are our priority.  As part of our continuing mission to provide you with exceptional heart care, we have created designated Provider Care Teams.  These Care Teams include your primary Cardiologist (physician) and Advanced Practice Providers (APPs -  Physician Assistants and Nurse Practitioners) who all work together to provide you with the care you need, when you need it.  We recommend signing up for the patient portal called "MyChart".  Sign up information is provided on this After Visit Summary.  MyChart is used to connect with patients for Virtual Visits (Telemedicine).  Patients are able to view lab/test results, encounter notes, upcoming appointments, etc.  Non-urgent messages can be sent to your provider as well.   To learn more about what you can do with MyChart, go to [email or web address].    Your next appointment:  AS SCHEDULED    Other Instructions

## 2019-09-13 LAB — BASIC METABOLIC PANEL
BUN/Creatinine Ratio: 32 — ABNORMAL HIGH (ref 12–28)
BUN: 20 mg/dL (ref 8–27)
CO2: 20 mmol/L (ref 20–29)
Calcium: 8.9 mg/dL (ref 8.7–10.3)
Chloride: 100 mmol/L (ref 96–106)
Creatinine, Ser: 0.63 mg/dL (ref 0.57–1.00)
GFR calc Af Amer: 95 mL/min/{1.73_m2} (ref >59)
GFR calc non Af Amer: 82 mL/min/{1.73_m2} (ref >59)
Glucose: 98 mg/dL (ref 65–99)
Potassium: 4.1 mmol/L (ref 3.5–5.2)
Sodium: 134 mmol/L (ref 134–144)

## 2019-09-15 ENCOUNTER — Telehealth: Payer: Self-pay | Admitting: Physician Assistant

## 2019-09-15 MED ORDER — FUROSEMIDE 40 MG PO TABS
40.0000 mg | ORAL_TABLET | Freq: Every day | ORAL | 3 refills | Status: DC
Start: 2019-09-15 — End: 2019-12-17

## 2019-09-15 MED ORDER — POTASSIUM CHLORIDE ER 10 MEQ PO TBCR
10.0000 meq | EXTENDED_RELEASE_TABLET | Freq: Every day | ORAL | 3 refills | Status: DC
Start: 2019-09-15 — End: 2019-12-17

## 2019-09-15 NOTE — Telephone Encounter (Signed)
Rachel Park is calling stating when she last saw Rachel Park she stated she was starting her on 40 MG Lasix as well as Potassium, but neither medication was ever sent to the pharmacy. I did find the 40 MG Lasix in the pt's AVS and sent a refill request in regards to it, but there was no documentation of starting potasium on record. Rachel Park is requesting a nurse call and explain. Please advise.

## 2019-09-15 NOTE — Telephone Encounter (Signed)
*  STAT* If patient is at the pharmacy, call can be transferred to refill team.   1. Which medications need to be refilled? (please list name of each medication and dose if known)   LASIX 40 MG   2. Which pharmacy/location (including street and city if local pharmacy) is medication to be sent to? [place], [place] - [address]  3. Do they need a 30 day or 90 day supply? 90 day supply

## 2019-09-15 NOTE — Telephone Encounter (Signed)
Per ov note w R Ursuy on 09/12/19: I have reviewed the case with Dr. Tamala Julian, suspect she did not get to her dry weight at her hospital stay and volume OL is driving her rate. Take lasix 40mg  when she gets home (total 60mg  today) and 40mg  daily, add K+ 55meq daily BMET today  Follow up next week -________________________________________________________________ Damaris Schooner w patient. BMET was completed on 7/9.  Creatinine, sodium, potassium are normal. Sent prescriptions for the increased dose of lasix and potassium 10 meq.   She has increased lasix to 40 mg over weekend but did not have any potassium.  She will pick up and begin today.  She is urinating much more than usual on the higher dose of lasix.  Adv to eat a banana or drink V8 today in addition since she was without potassium supp.

## 2019-09-16 ENCOUNTER — Ambulatory Visit: Payer: Medicare HMO | Admitting: Cardiology

## 2019-09-16 ENCOUNTER — Other Ambulatory Visit: Payer: Self-pay

## 2019-09-16 ENCOUNTER — Encounter: Payer: Self-pay | Admitting: Cardiology

## 2019-09-16 VITALS — BP 132/80 | HR 63 | Ht 62.0 in | Wt 126.8 lb

## 2019-09-16 DIAGNOSIS — I4821 Permanent atrial fibrillation: Secondary | ICD-10-CM

## 2019-09-16 NOTE — Patient Instructions (Signed)
Medication Instructions:  Your physician recommends that you continue on your current medications as directed. Please refer to the Current Medication list given to you today.  *If you need a refill on your cardiac medications before your next appointment, please call your pharmacy*   Lab Work: None ordered   Testing/Procedures: None ordered   Follow-Up: At [facility], you and your health needs are our priority.  As part of our continuing mission to provide you with exceptional heart care, we have created designated Provider Care Teams.  These Care Teams include your primary Cardiologist (physician) and Advanced Practice Providers (APPs -  Physician Assistants and Nurse Practitioners) who all work together to provide you with the care you need, when you need it.  Your next appointment:   3 month(s)  The format for your next appointment:   In Person  Provider:   Tommye Standard, PA-C    Your physician wants you to follow-up in: 6 months with Dr. Curt Bears.  You will receive a reminder letter in the mail two months in advance. If you don't receive a letter, please call our office to schedule the follow-up appointment   Thank you for choosing [facility]!!   Trinidad Curet, RN [phone]    Other Instructions

## 2019-09-16 NOTE — Telephone Encounter (Signed)
Pt's medication has already been sent to pt's pharmacy as requested. Confirmation received.  

## 2019-09-16 NOTE — Progress Notes (Signed)
Electrophysiology Office Note   Date:  09/16/2019   ID:  Rachel Park, DOB 03-Nov-1931, MRN [id number]  PCP:  Lajean Manes, MD  Cardiologist:  Quentin Mulling Primary Electrophysiologist:  Constance Haw, MD    No chief complaint on file.    History of Present Illness: Rachel Park is a 86 y.o. female who is being seen today for the evaluation of atrial fibrillation at the request of Lajean Manes, MD. Presenting today for electrophysiology evaluation.  Has a history of coronary artery disease status post LAD stent complicated by CVA in 2683, atrial fibrillation.  She has been taken off of Eliquis due to prior GI bleeding.  She was admitted 2 16-2 19 with rapid atrial fibrillation.  She presented with abdominal pain and CT scan confirmed renal infarctions.  She was placed back on Eliquis.  She was also placed on sotalol.  She did convert back to sinus rhythm.  Today, denies symptoms of palpitations, chest pain, shortness of breath, orthopnea, PND, lower extremity edema, claudication, dizziness, presyncope, syncope, bleeding, or neurologic sequela. The patient is tolerating medications without difficulties.  Overall she is doing well.  She was seen a few days ago with shortness of breath and rapid atrial fibrillation.  She was diuresed and with diuresis, her heart rate came down to a more acceptable level.  She is continued in atrial fibrillation, though her shortness of breath has improved.  Past Medical History:  Diagnosis Date  . Arthritis   . Complication of anesthesia   . Coronary artery disease   . Hypertension   . Hypothyroidism   . PAF (paroxysmal atrial fibrillation) ([place])    on Xarelto for maybe a year, opted to stop  . PONV (postoperative nausea and vomiting)   . Renal infarction ([place])   . Small bowel obstruction ([place]) 06/2015  . Stroke [facility])    oct 2016  . TIA (transient ischemic attack) 03/24/2015  . Vertigo, benign positional    to be evaluated, intermittent    Past Surgical History:  Procedure Laterality Date  . ABDOMINAL HYSTERECTOMY    . APPLICATION OF WOUND VAC N/A 07/23/2018   Procedure: APPLICATION OF WOUND VAC;  Surgeon: Rolm Bookbinder, MD;  Location: [place];  Service: General;  Laterality: N/A;  . BOWEL RESECTION N/A 07/23/2018   Procedure: SMALL BOWEL RESECTION;  Surgeon: Rolm Bookbinder, MD;  Location: [place];  Service: General;  Laterality: N/A;  . CARDIAC CATHETERIZATION N/A 12/24/2014   Procedure: Left Heart Cath and Coronary Angiography;  Surgeon: Sherren Mocha, MD;  Location: [place] CV LAB;  Service: Cardiovascular;  Laterality: N/A;  . CARDIOVERSION N/A 05/22/2018   Procedure: CARDIOVERSION;  Surgeon: Josue Hector, MD;  Location: [facility] ENDOSCOPY;  Service: Cardiovascular;  Laterality: N/A;  . CATARACT EXTRACTION Bilateral   . COLONOSCOPY WITH PROPOFOL N/A 12/01/2013   Procedure: COLONOSCOPY WITH PROPOFOL;  Surgeon: Garlan Fair, MD;  Location: [facility] ENDOSCOPY;  Service: Endoscopy;  Laterality: N/A;  . COMPLEX WOUND CLOSURE  07/25/2018   Procedure: Complex Wound Closure;  Surgeon: Rolm Bookbinder, MD;  Location: [place];  Service: General;;  . Pennie Rushing  07/25/2018   Procedure: Ileocecetomy;  Surgeon: Rolm Bookbinder, MD;  Location: [place];  Service: General;;  . INCISIONAL HERNIA REPAIR N/A 03/10/2016   Procedure: INCISIONAL HERNIA REPAIR TIMES TWO;  Surgeon: Armandina Gemma, MD;  Location: [place];  Service: General;  Laterality: N/A;  . INGUINAL HERNIA REPAIR Bilateral 03/10/2016   Procedure: BILATERAL INGUINAL HERNIA REPAIRS;  Surgeon: Armandina Gemma, MD;  Location: [place];  Service: General;  Laterality: Bilateral;  . INSERTION OF MESH N/A 03/10/2016   Procedure: INSERTION OF MESH TO BILATERAL GROINS AND ABDOMEN;  Surgeon: Armandina Gemma, MD;  Location: [place];  Service: General;  Laterality: N/A;  . laparotomy  06/2015  . LAPAROTOMY N/A 07/23/2018   Procedure: EXPLORATORY LAPAROTOMY;  Surgeon: Rolm Bookbinder, MD;  Location: [place];  Service: General;  Laterality: N/A;  . LEFT HEART CATH AND CORONARY ANGIOGRAPHY N/A 09/27/2016   Procedure: Left Heart Cath and Coronary Angiography;  Surgeon: Sherren Mocha, MD;  Location: [place] CV LAB;  Service: Cardiovascular;  Laterality: N/A;  . TONSILLECTOMY       Current Outpatient Medications  Medication Sig Dispense Refill  . acetaminophen (TYLENOL) 500 MG tablet Take 1,000 mg by mouth every 6 (six) hours as needed for mild pain.     Marland Kitchen apixaban (ELIQUIS) 2.5 MG TABS tablet Take 1 tablet (2.5 mg total) by mouth 2 (two) times daily. 60 tablet 5  . Calcium Carb-Cholecalciferol (CALCIUM + D3 PO) Take 1 tablet by mouth daily at 12 noon.    . Cholecalciferol (VITAMIN D3) 50 MCG (2000 UT) TABS Take 2,000 Units by mouth daily with lunch.    . diltiazem (CARDIZEM CD) 300 MG 24 hr capsule Take 1 capsule (300 mg total) by mouth daily. 30 capsule 2  . furosemide (LASIX) 40 MG tablet Take 1 tablet (40 mg total) by mouth daily. 90 tablet 3  . levothyroxine (SYNTHROID, LEVOTHROID) 75 MCG tablet Take 75 mcg by mouth daily before breakfast.    . metoprolol succinate (TOPROL XL) 100 MG 24 hr tablet Take 1 tablet (100 mg total) by mouth 2 (two) times daily. Take with or immediately following a meal. 60 tablet 2  . Multiple Vitamin (MULTIVITAMIN WITH MINERALS) TABS tablet Take 1 tablet by mouth daily.    Marland Kitchen NITROSTAT 0.4 MG SL tablet Place 1 tablet (0.4 mg total) under the tongue every 5 (five) minutes as needed. Chest pain 25 tablet 6  . Polyethyl Glycol-Propyl Glycol (SYSTANE ULTRA) 0.4-0.3 % SOLN Place 1-2 drops into both eyes every other day.     . potassium chloride (KLOR-CON) 10 MEQ tablet Take 1 tablet (10 mEq total) by mouth daily. 90 tablet 3  . rosuvastatin (CRESTOR) 10 MG tablet Take 1 tablet by mouth once daily 90 tablet 3  . sertraline (ZOLOFT) 25 MG tablet Take 25 mg by mouth daily.     No current facility-administered medications for this visit.    Allergies:   Propofol    Social History:  The patient  reports that she has never smoked. She has never used smokeless tobacco. She reports previous alcohol use. She reports that she does not use drugs.   Family History:  The patient's family history includes Cancer in her sister; Heart attack (age of onset: 9) in her father; Stroke in her mother.    ROS:  Please see the history of present illness.   Otherwise, review of systems is positive for none.   All other systems are reviewed and negative.   PHYSICAL EXAM: VS:  BP 132/80   Pulse 63   Ht 5\' 2"  (1.575 m)   Wt 126 lb 12.8 oz (57.5 kg)   SpO2 92%   BMI 23.19 kg/m  , BMI Body mass index is 23.19 kg/m. GEN: Well nourished, well developed, in no acute distress  HEENT: normal  Neck: no JVD, carotid bruits, or  masses Cardiac: irregular; no murmurs, rubs, or gallops,no edema  Respiratory:  clear to auscultation bilaterally, normal work of breathing GI: soft, nontender, nondistended, + BS MS: no deformity or atrophy  Skin: warm and dry Neuro:  Strength and sensation are intact Psych: euthymic mood, full affect  EKG:  EKG is not ordered today. Personal review of the ekg ordered 09/12/19 shows atrial fibrillation, rate 110  Recent Labs: 06/16/2019: ALT 14 08/21/2019: TSH 11.653 08/22/2019: Hemoglobin 10.6; Platelets 239 08/24/2019: Magnesium 1.7 09/12/2019: BUN 20; Creatinine, Ser 0.63; Potassium 4.1; Sodium 134    Lipid Panel     Component Value Date/Time   CHOL 85 08/22/2019 0552   CHOL 124 06/16/2019 1105   TRIG 53 08/22/2019 0552   HDL 45 08/22/2019 0552   HDL 72 06/16/2019 1105   CHOLHDL 1.9 08/22/2019 0552   VLDL 11 08/22/2019 0552   LDLCALC 29 08/22/2019 0552   LDLCALC 37 06/16/2019 1105     Wt Readings from Last 3 Encounters:  09/16/19 126 lb 12.8 oz (57.5 kg)  09/12/19 134 lb (60.8 kg)  08/25/19 131 lb 8 oz (59.6 kg)      Other studies Reviewed: Additional studies/ records that were reviewed today include: TTE 04/21/18  Review of  the above records today demonstrates:   1. The left ventricle has normal systolic function, with an ejection fraction of 55-60%. The cavity size was normal. Mild basal septal hypertrophy. Left ventricular diastology could not be evaluated secondary to atrial fibrillation.  2. The right ventricle has normal systolic function. The cavity was normal. There is no increase in right ventricular wall thickness.  3. Left atrial size was moderately dilated.  4. The mitral valve is normal in structure.  5. The tricuspid valve is normal in structure. Tricuspid valve regurgitation is mild-moderate.  6. The aortic valve is tricuspid Mild sclerosis of the aortic valve.  7. The pulmonic valve was normal in structure. Pulmonic valve regurgitation is mild by color flow Doppler.  8. Right atrial pressure is estimated at 3 mmHg.   ASSESSMENT AND PLAN:  1.  Persistent atrial fibrillation: Currently on Eliquis and diltiazem.  CHA2DS2-VASc of 6.  She remains in atrial fibrillation today, though she is feeling better than she was a few days ago.  She was found to be mildly volume overloaded and has been since diuresed.  Her rates are better controlled.  No changes.  If she does develop rapid episodes of atrial fibrillation better medication unresponsive, AV node ablation and pacemaker would be the next step.  2.  Coronary artery disease: Status post LAD stent in 1027 complicated by CVA.  No current chest pain.    Current medicines are reviewed at length with the patient today.   The patient does not have concerns regarding her medicines.  The following changes were made today: Arrange for dofetilide loading  Labs/ tests ordered today include:  Orders Placed This Encounter  Procedures  . EKG 12-Lead    Disposition:   FU with Mazey Mantell 6 months  Signed, Hagen Tidd Meredith Leeds, MD  09/16/2019 12:01 PM     [address] [place] [address] [phone]  (office) [phone] (fax)

## 2019-09-22 DIAGNOSIS — I10 Essential (primary) hypertension: Secondary | ICD-10-CM | POA: Diagnosis not present

## 2019-09-22 DIAGNOSIS — D6869 Other thrombophilia: Secondary | ICD-10-CM | POA: Diagnosis not present

## 2019-09-22 DIAGNOSIS — R69 Illness, unspecified: Secondary | ICD-10-CM | POA: Diagnosis not present

## 2019-09-22 DIAGNOSIS — I480 Paroxysmal atrial fibrillation: Secondary | ICD-10-CM | POA: Diagnosis not present

## 2019-10-06 ENCOUNTER — Ambulatory Visit: Payer: Medicare HMO | Admitting: Cardiovascular Disease

## 2019-10-08 DIAGNOSIS — I25119 Atherosclerotic heart disease of native coronary artery with unspecified angina pectoris: Secondary | ICD-10-CM | POA: Diagnosis not present

## 2019-10-08 DIAGNOSIS — E039 Hypothyroidism, unspecified: Secondary | ICD-10-CM | POA: Diagnosis not present

## 2019-10-08 DIAGNOSIS — E7800 Pure hypercholesterolemia, unspecified: Secondary | ICD-10-CM | POA: Diagnosis not present

## 2019-10-08 DIAGNOSIS — M8580 Other specified disorders of bone density and structure, unspecified site: Secondary | ICD-10-CM | POA: Diagnosis not present

## 2019-10-08 DIAGNOSIS — I480 Paroxysmal atrial fibrillation: Secondary | ICD-10-CM | POA: Diagnosis not present

## 2019-10-08 DIAGNOSIS — I10 Essential (primary) hypertension: Secondary | ICD-10-CM | POA: Diagnosis not present

## 2019-10-28 DIAGNOSIS — R69 Illness, unspecified: Secondary | ICD-10-CM | POA: Diagnosis not present

## 2019-10-28 DIAGNOSIS — I480 Paroxysmal atrial fibrillation: Secondary | ICD-10-CM | POA: Diagnosis not present

## 2019-10-28 DIAGNOSIS — Z0000 Encounter for general adult medical examination without abnormal findings: Secondary | ICD-10-CM | POA: Diagnosis not present

## 2019-10-28 DIAGNOSIS — D6869 Other thrombophilia: Secondary | ICD-10-CM | POA: Diagnosis not present

## 2019-10-28 DIAGNOSIS — I25119 Atherosclerotic heart disease of native coronary artery with unspecified angina pectoris: Secondary | ICD-10-CM | POA: Diagnosis not present

## 2019-10-28 DIAGNOSIS — Z1389 Encounter for screening for other disorder: Secondary | ICD-10-CM | POA: Diagnosis not present

## 2019-10-28 DIAGNOSIS — M8580 Other specified disorders of bone density and structure, unspecified site: Secondary | ICD-10-CM | POA: Diagnosis not present

## 2019-10-28 DIAGNOSIS — Z79899 Other long term (current) drug therapy: Secondary | ICD-10-CM | POA: Diagnosis not present

## 2019-10-28 DIAGNOSIS — E7800 Pure hypercholesterolemia, unspecified: Secondary | ICD-10-CM | POA: Diagnosis not present

## 2019-10-28 DIAGNOSIS — E039 Hypothyroidism, unspecified: Secondary | ICD-10-CM | POA: Diagnosis not present

## 2019-10-28 DIAGNOSIS — I10 Essential (primary) hypertension: Secondary | ICD-10-CM | POA: Diagnosis not present

## 2019-11-24 DIAGNOSIS — R69 Illness, unspecified: Secondary | ICD-10-CM | POA: Diagnosis not present

## 2019-11-25 DIAGNOSIS — Z79899 Other long term (current) drug therapy: Secondary | ICD-10-CM | POA: Diagnosis not present

## 2019-12-11 DIAGNOSIS — Z23 Encounter for immunization: Secondary | ICD-10-CM | POA: Diagnosis not present

## 2019-12-13 ENCOUNTER — Other Ambulatory Visit: Payer: Self-pay | Admitting: Student

## 2019-12-15 DIAGNOSIS — H52201 Unspecified astigmatism, right eye: Secondary | ICD-10-CM | POA: Diagnosis not present

## 2019-12-15 DIAGNOSIS — H43813 Vitreous degeneration, bilateral: Secondary | ICD-10-CM | POA: Diagnosis not present

## 2019-12-15 DIAGNOSIS — H04123 Dry eye syndrome of bilateral lacrimal glands: Secondary | ICD-10-CM | POA: Diagnosis not present

## 2019-12-15 DIAGNOSIS — H35371 Puckering of macula, right eye: Secondary | ICD-10-CM | POA: Diagnosis not present

## 2019-12-16 ENCOUNTER — Encounter: Payer: Self-pay | Admitting: Cardiovascular Disease

## 2019-12-16 NOTE — Progress Notes (Signed)
Cardiology Office Note   Date:  12/17/2019   ID:  Rachel Park, DOB 05/26/1931, MRN [id number]  PCP:  Lajean Manes, MD  Cardiologist:   Mertie Moores, MD   Chief Complaint  Patient presents with  . Coronary Artery Disease  . Atrial Fibrillation    problem list 1. Coronary artery  disease-status post stenting of her proximal LAD 2.  CVA- occurred during the stenting 3.  Atrial Fibrillation    Previous notes  ADRIONA Park is a 86 y.o. female who presents for follow up of her stenting She has done well.  Is a bit anxious - may account for her elevated BP today .  Memory has improved ,  No residual CVA symptoms.   Feb. 1, 2017: Doing well Has some fatigue. Has some bruising on lower logs  -  On Plavix   June 21, 2015:  Rachel Park was diagnosed with atrial fib, was started on eliquis 5 mg BID.  She was in [place] with her sister. Developed abdominal pain .   Severe hypoxemia .   In great distress. Was found to have a small bowell perforation .   Was in the ICU for 2 days.   Developed hypotension , was started on levophed,   Developed atrial fib  plavix was stopped,   Heparin was started.   Has converted to NSR at this time  Has had some band like chest pain since that time Has shortness of breath - at rest and with exertion .     September 22, 2015:  Rachel Park is doing well.  We DC'd her Eliquis - due to bleeding.   She is on Plavix  Her only episode of a-fib was in the setting of a small bowel obstruction   Is very fatigued.   Was put on Iron tablet by Dr. Felipa Eth. Hb is still low  [place] also increased her Coreg to 25 BID .  BP readings at home have been well controlled.  Has not had any syncope Goes for a walk every day and is fatigued when she is done with the walk.   No CP .  Has occasional chest tightness .  Same symptoms back in April - [name] was normal in April, 2017   Feb. 16, 2018:  Rachel Park is seen today . Has had surgery to repair 4  hernias.   Cant get her energy  Its only been a month - I reminded her that it takes time to heal after surgery .  No CP or dyspnea.   Sept. 24, 2018:  Rachel Park is seen today  Had some CP in July Repeat cath shows no obstructive CAD ,  LAD stent was widely patent  Has had some bursitis of left hip and knee.  No CP .   Thought to have reflux.   Tried Zantac, caused diarrhea, did not continue the zantac.   She has tried Crestor 10 mg in the past. But we had to decrease the dose to 5 g because of muscle aches. She thinks that she might be eating little bit more than she should.   May 23, 2017:  Doing well.  Has been working with the pharmacist at Dr. Sherryll Burger office  They are concerned about her HR in the 17s .  Has not had any syncope.   Has profound lack of energy - several hours after taking the coreg   Sept. 17, 2019: Rachel Park is seen for follow up of her CAD  and PAF HR is typically low. Has not had any further episodes of presyncope since we reduced the dose of Coreg. Still eats a bit more salt than she should   September 24, 2018  Rachel Park was admitted with ischemia bowel on Jul 23, 2018.   She had a prolonged ICU stay and developed AF with RVR.   She had a prolonged hospitalization with slow recovery.  She was started on Eliquis at the time of discharge.   She diuresed well   She was seen by Cecilie Kicks, NP a month ago .  Seemed to be doing well,  Was started back on amiodarone. Her torsemide has been changed to PRN  Breathing is better.  Eating well.   Abdomen is better.   Aug. 19, 2020   Rachel Park is seen today . She was seen a month ago and was set up for a cardioversion.  She had been started on amiodarone and was in NSR when she showed up for her cardioverison She reported some tachycardia and generalized weakness  recently. She was thought to have developed recurrent AF and her amiodarone was increased to 200 mg BID .   Sept. 10, 2020  Rachel Park is seen for follow up of  her atrial flutter. I saw her several weeks ago and found her to be in atrial flutter.   We doubled her amiodarone and had her return today for follow up  Nov. 11 , 2020  Rachel Park is seen today for follow up of atrial flutter  Ankles have been swollen for the past month She had run out of her lasix that she had received from the hospital  She takes lasix 20 mg as needed - has not needed it for the past 3 weeks.   Jan. 12, 2021  Rachel Park is seen today for follow up of her CAD and atrial fib She has persistent AF.   We stopped her amiodarone several months ago and have gone with a rate control and anticoagulation strategy Has had more fatigue recently .    June 16, 2019:  Rachel Park is seen today for follow-up visit.  She has atrial fibrillation.  We have adopted a rate control and anticoagulation strategy.  Blood pressure and heart rate look good. Doesn't have much energy.  No real muscle weakness.  She walks every am for 40 minutes  Brought her HR and BP log.   Overall her HR and BP have been well controled.   August 11, 2019:  Rachel Park is seen today for a work in visit because of persistent rapid atrial fibrillation.  Her blood pressure is also  Low.  We had stopped her metoprolol and put her on diltiazem due to fatigue.  She has been on diltiazem but her heart rate has been not as well controlled..  Her fatigue is no different even off the metoprolol  She has no signs or symptoms of any underlying infection.  She is eating and drinking well.  Oct. 13, 2021: Rachel Park is seen today for follow up of her atrial fib, CAD She has continued to be fatigued She has been tried on tikosyn ( March 2020), failed to stay in rhythm.  Tried amiodarone.  She is more short of breath recently .  Has lots of fatigue  She has seen Dr. Curt Bears .  They discussed possible AV node ablation and pacer placement  Is on lasix for ankle edema     Past Medical History:  Diagnosis Date  . Arthritis   .  Complication  of anesthesia   . Coronary artery disease   . Hypertension   . Hypothyroidism   . PAF (paroxysmal atrial fibrillation) ([place])    on Xarelto for maybe a year, opted to stop  . PONV (postoperative nausea and vomiting)   . Renal infarction ([place])   . Small bowel obstruction ([place]) 06/2015  . Stroke [facility])    oct 2016  . TIA (transient ischemic attack) 03/24/2015  . Vertigo, benign positional    to be evaluated, intermittent    Past Surgical History:  Procedure Laterality Date  . ABDOMINAL HYSTERECTOMY    . APPLICATION OF WOUND VAC N/A 07/23/2018   Procedure: APPLICATION OF WOUND VAC;  Surgeon: Rolm Bookbinder, MD;  Location: [place];  Service: General;  Laterality: N/A;  . BOWEL RESECTION N/A 07/23/2018   Procedure: SMALL BOWEL RESECTION;  Surgeon: Rolm Bookbinder, MD;  Location: [place];  Service: General;  Laterality: N/A;  . CARDIAC CATHETERIZATION N/A 12/24/2014   Procedure: Left Heart Cath and Coronary Angiography;  Surgeon: Sherren Mocha, MD;  Location: [place] [facility] LAB;  Service: Cardiovascular;  Laterality: N/A;  . CARDIOVERSION N/A 05/22/2018   Procedure: CARDIOVERSION;  Surgeon: Josue Hector, MD;  Location: [facility] ENDOSCOPY;  Service: Cardiovascular;  Laterality: N/A;  . CATARACT EXTRACTION Bilateral   . COLONOSCOPY WITH PROPOFOL N/A 12/01/2013   Procedure: COLONOSCOPY WITH PROPOFOL;  Surgeon: Garlan Fair, MD;  [facility] ENDOSCOPY;  Service: Endoscopy;  Laterality: N/A;  . COMPLEX WOUND CLOSURE  07/25/2018   Procedure: Complex Wound Closure;  Surgeon: Rolm Bookbinder, MD;  Location: [place];  Service: General;;  . Pennie Rushing  07/25/2018   Procedure: Ileocecetomy;  Surgeon: Rolm Bookbinder, MD;  Location: [place];  Service: General;;  . INCISIONAL HERNIA REPAIR N/A 03/10/2016   Procedure: INCISIONAL HERNIA REPAIR TIMES TWO;  Surgeon: Armandina Gemma, MD;  Location: [place];  Service: General;  Laterality: N/A;  . INGUINAL HERNIA REPAIR Bilateral 03/10/2016   Procedure: BILATERAL  INGUINAL HERNIA REPAIRS;  Surgeon: Armandina Gemma, MD;  Location: [place];  Service: General;  Laterality: Bilateral;  . INSERTION OF MESH N/A 03/10/2016   Procedure: INSERTION OF MESH TO BILATERAL GROINS AND ABDOMEN;  Surgeon: Armandina Gemma, MD;  Location: [place];  Service: General;  Laterality: N/A;  . laparotomy  06/2015  . LAPAROTOMY N/A 07/23/2018   Procedure: EXPLORATORY LAPAROTOMY;  Surgeon: Rolm Bookbinder, MD;  Location: [place];  Service: General;  Laterality: N/A;  . LEFT HEART CATH AND CORONARY ANGIOGRAPHY N/A 09/27/2016   Procedure: Left Heart Cath and Coronary Angiography;  Surgeon: Sherren Mocha, MD;  Location: [place] CV LAB;  Service: Cardiovascular;  Laterality: N/A;  . TONSILLECTOMY       Current Outpatient Medications  Medication Sig Dispense Refill  . acetaminophen (TYLENOL) 500 MG tablet Take 1,000 mg by mouth every 6 (six) hours as needed for mild pain.     Marland Kitchen apixaban (ELIQUIS) 2.5 MG TABS tablet Take 1 tablet (2.5 mg total) by mouth 2 (two) times daily. 60 tablet 5  . Calcium Carb-Cholecalciferol (CALCIUM + D3 PO) Take 1 tablet by mouth daily at 12 noon.    . Cholecalciferol (VITAMIN D3) 50 MCG (2000 UT) TABS Take 2,000 Units by mouth daily with lunch.    . diltiazem (CARDIZEM CD) 300 MG 24 hr capsule Take 1 capsule (300 mg total) by mouth daily. 30 capsule 2  . levothyroxine (SYNTHROID) 88 MCG tablet Take 88 mcg by mouth every morning.    Marland Kitchen  metoprolol succinate (TOPROL-XL) 100 MG 24 hr tablet TAKE 1 TABLET BY MOUTH TWICE DAILY WITH A MEAL OR  IMMEDIATELY  FOLLOWING  A  MEAL. 60 tablet 8  . Multiple Vitamin (MULTIVITAMIN WITH MINERALS) TABS tablet Take 1 tablet by mouth daily.    Marland Kitchen NITROSTAT 0.4 MG SL tablet Place 1 tablet (0.4 mg total) under the tongue every 5 (five) minutes as needed. Chest pain 25 tablet 6  . Polyethyl Glycol-Propyl Glycol (SYSTANE ULTRA) 0.4-0.3 % SOLN Place 1-2 drops into both eyes every other day.     . rosuvastatin (CRESTOR) 10 MG tablet Take 1  tablet by mouth once daily 90 tablet 3  . sertraline (ZOLOFT) 25 MG tablet Take 25 mg by mouth daily.    Marland Kitchen spironolactone (ALDACTONE) 25 MG tablet Take 1 tablet (25 mg total) by mouth daily. 90 tablet 3   No current facility-administered medications for this visit.    Allergies:   Propofol    Social History:  The patient  reports that she has never smoked. She has never used smokeless tobacco. She reports previous alcohol use. She reports that she does not use drugs.   Family History:  The patient's family history includes Cancer in her sister; Heart attack (age of onset: 81) in her father; Stroke in her mother.    ROS:       Physical Exam: Blood pressure 132/78, pulse 80, height 5\' 2"  (1.575 m), weight 137 lb 3.2 oz (62.2 kg), SpO2 97 %.  GEN:  Well nourished, well developed in no acute distress HEENT: Normal NECK: No JVD; No carotid bruits LYMPHATICS: No lymphadenopathy CARDIAC: irreg. Irreg. , no murmurs, rubs, gallops RESPIRATORY:  Clear to auscultation without rales, wheezing or rhonchi  ABDOMEN: Soft, non-tender, non-distended MUSCULOSKELETAL:  No edema; No deformity  SKIN: Warm and dry NEUROLOGIC:  Alert and oriented x 3    EKG:       Recent Labs: 06/16/2019: ALT 14 08/21/2019: TSH 11.653 08/22/2019: Hemoglobin 10.6; Platelets 239 08/24/2019: Magnesium 1.7 09/12/2019: BUN 20; Creatinine, Ser 0.63; Potassium 4.1; Sodium 134    Lipid Panel    Component Value Date/Time   CHOL 85 08/22/2019 0552   CHOL 124 06/16/2019 1105   TRIG 53 08/22/2019 0552   HDL 45 08/22/2019 0552   HDL 72 06/16/2019 1105   CHOLHDL 1.9 08/22/2019 0552   VLDL 11 08/22/2019 0552   LDLCALC 29 08/22/2019 0552   LDLCALC 37 06/16/2019 1105      Wt Readings from Last 3 Encounters:  12/17/19 137 lb 3.2 oz (62.2 kg)  09/16/19 126 lb 12.8 oz (57.5 kg)  09/12/19 134 lb (60.8 kg)      Other studies Reviewed: Additional studies/ records that were reviewed today include: . Review of the  above records demonstrates:    ASSESSMENT AND PLAN:  1.  CAD -      she denies any angina.  2. Persistent  atrial fib/ atrial flutter  - CHADS2VASC score of 4-6  ( female, age > 36, vascular disease,   TIA and a CVA ( as a complication to her cath)    Will see Dr. Curt Bears / Marinus Maw soon They are considering AV node ablation with pacer .   3.  Pulmonary Hypertension:   She mas mild pulmonary HTN.   She has not improved with the lasix.   Will DC lasix and kdur and try Spironolactone 25 mg a day .  BMP in 2-3 weeks.    3. Hyperlipidemia:  Check lipids, liver enz, bmp at her next visit in 6 months   5.  Hypertension::  BP is well controlled.   Current medicines are reviewed at length with the patient today.  The patient does not have concerns regarding medicines.  The following changes have been made:  no change  Labs/ tests ordered today include:   Orders Placed This Encounter  Procedures  . Basic metabolic panel  . Basic metabolic panel  . Basic metabolic panel  . Hepatic function panel  . Lipid panel    Disposition:     Mertie Moores, MD  12/17/2019 10:23 AM    [address], [place], [place]  [address] Phone: [phone]; Fax: [phone]

## 2019-12-16 NOTE — Progress Notes (Addendum)
Cardiology Office Note Date:  12/16/2019  Patient ID:  Rachel Park [birth year], MRN [id number] PCP:  Lajean Manes, MD  Cardiologist:  Dr. Acie Fredrickson EP: Dr. Curt Bears    Chief Complaint:  Planned follow up  History of Present Illness: Rachel Park is a 86 y.o. female with history of CAD (PCI to LAD, cath 2018 patent with stable CAD), stroke, AFib, HTN, Hypothyroidism.  She was admitted march 2020 for Tikosyn initiation, DCCV during this admission restrored SR though she had ERAF the same day.  Notes discuss rate control meds not pursued with known sinus rate 50's.  She discharged in [place]. She was in Afib at her 1 week f/u Arrived to [place] 05/30/2019 in [place]  She had AFib during a prolonged hospital stay with an incarcerated/ischemic bowel May/June 2020 Shew had an RUE DVT catheter related, maintained on Eliquis  She has struggled with rate control with rate/rhythm control.  Jan 2021 cards note mentions that her amiodarone was stopped with transition to rate control strategy Poorly tolerated rate control drugs with fatigue prompting discontinuation of her metoprolol with resultant RVR and resumption of her BB  Admitted 08/21/2019 with progressive SOB, bloating noted RVR and volume OL Discharged 08/25/19 Diuresed and rate control meds titrated Left on Toprol XL 100mg  BID and Dilt 300mg  daily, discharge vitals note HR 80 Eliquis dose adjusted as well for age/weight. during her stay, Dr. Curt Bears saw her His thoughts were: Her ejection fraction is mildly reduced which could be due to a tachycardia mediated cardiomyopathy.  We will increase her diltiazem dose to hopefully improve her rate control overall.  I am concerned that she is now permanent and does not have an option for rhythm control.  She has been on a rate control strategy per her primary cardiologist.  I am concerned that with her overall rapid rates, she may continue to decline.  If rates cannot be controlled with  medications, she may benefit from AV node ablation and pacemaker implant   I saw her 09/12/2019 Unfortunately she reports that she never really felt better after her hospital stay, left feeling very similar to when she went in. She reports years of chest tightness, this dates back to the time of her last cath in 2018 with stable CAD, and "noone realll knows what it is".   There has been no change in this, no escalation in it's behavior. She says that she continued to feel SOB going home from the hospital though her swelling had improved dramatically. She continued to have a sense of SOB and continued to get winded easily.  Though would settle down and do OK. Yesterday she started to have increased SOB even at rest and last night unable to get any sleep could not get comfortable enough because of SOB, this has persisted today, feels breathless, even at rest. No pvert palpitations, no dizzy spells, no near syncope or syncope. No bleeding or signs of bleeding, reports compliance with her Eliquis. She brings her home vitals. HR is done via her BP cuff and her pulse OX and generally seem to correlate well. Until yesterday her HR looked OK, 70's-90's, pulse ox on a couple of occassions read higher. Both starting yesterday 120's Her O2 sats and BP's have been good Her d/c weight 131, today here is 134 She weighs at home and states has been 132-133, but this AM 129, she felt was a mistake Felt to still be volume OL Her lasix/K+ were increased  She saw  Dr. Curt Bears 09/16/19, was feeling better, still in AF though rates beter, felt if we ran into rate control issues going forward would likely need PPM/AV node ablation  She saw Dr. Acie Fredrickson 12/17/19, she remained fatigued, no CP, with some p.HTN her lasix/K+ stopped and started on spironolactone, HR was 80.   TODAY She is doing pretty good.  She likes the spironolactone much better reporting good urin output but not the urgency she had with furosemide, and  is happy to be off the K+ pill. No CP, no palpitations, no dizzy spells, near syncope or syncope. She c/w waking/waning SOB, with some DOE intermittently and some orthopnea, though both are better since her last visit with me.   She does weight at home, does not think she has had any particular large wings in her weight. She says her SOB is quite hit or miss, Sat evening she had trouble sleeping feeling like she was winded laying down, and got up, did feel more comfortable sitting up watching TV  Though not "normal", but Sunday about mid day realized that she was not SOB at all anymore.  Last night slept quite well.  Feels well today  AAD hx Sotalol failed to maintain SR/control rates > stopped March 2020 Tikosyn started march 2020 had ERAF and stopped only weeks later Amiodarone started April 2020 >> stopped Jan 2021 with failure to maintain SR June/July 2021 >>> rate control strategy   Past Medical History:  Diagnosis Date  . Arthritis   . Complication of anesthesia   . Coronary artery disease   . Hypertension   . Hypothyroidism   . PAF (paroxysmal atrial fibrillation) ([place])    on Xarelto for maybe a year, opted to stop  . PONV (postoperative nausea and vomiting)   . Renal infarction ([place])   . Small bowel obstruction ([place]) 06/2015  . Stroke [facility])    oct 2016  . TIA (transient ischemic attack) 03/24/2015  . Vertigo, benign positional    to be evaluated, intermittent    Past Surgical History:  Procedure Laterality Date  . ABDOMINAL HYSTERECTOMY    . APPLICATION OF WOUND VAC N/A 07/23/2018   Procedure: APPLICATION OF WOUND VAC;  Surgeon: Rolm Bookbinder, MD;  Location: [place];  Service: General;  Laterality: N/A;  . BOWEL RESECTION N/A 07/23/2018   Procedure: SMALL BOWEL RESECTION;  Surgeon: Rolm Bookbinder, MD;  Location: [place];  Service: General;  Laterality: N/A;  . CARDIAC CATHETERIZATION N/A 12/24/2014   Procedure: Left Heart Cath and Coronary Angiography;  Surgeon:  Sherren Mocha, MD;  Location: [facility];  Service: Cardiovascular;  Laterality: N/A;  . CARDIOVERSION N/A 05/22/2018   Procedure: CARDIOVERSION;  Surgeon: Josue Hector, MD;  Location: [facility] ENDOSCOPY;  Service: Cardiovascular;  Laterality: N/A;  . CATARACT EXTRACTION Bilateral   . COLONOSCOPY WITH PROPOFOL N/A 12/01/2013   Procedure: COLONOSCOPY WITH PROPOFOL;  Surgeon: Garlan Fair, MD;  Location: [facility] ENDOSCOPY;  Service: Endoscopy;  Laterality: N/A;  . COMPLEX WOUND CLOSURE  07/25/2018   Procedure: Complex Wound Closure;  Surgeon: Rolm Bookbinder, MD;  Location: [place];  Service: General;;  . Pennie Rushing  07/25/2018   Procedure: Ileocecetomy;  Surgeon: Rolm Bookbinder, MD;  Location: [place];  Service: General;;  . INCISIONAL HERNIA REPAIR N/A 03/10/2016   Procedure: INCISIONAL HERNIA REPAIR TIMES TWO;  Surgeon: Armandina Gemma, MD;  Location: [place];  Service: General;  Laterality: N/A;  . INGUINAL HERNIA REPAIR Bilateral 03/10/2016   Procedure: BILATERAL INGUINAL HERNIA  REPAIRS;  Surgeon: Armandina Gemma, MD;  Location: [place];  Service: General;  Laterality: Bilateral;  . INSERTION OF MESH N/A 03/10/2016   Procedure: INSERTION OF MESH TO BILATERAL GROINS AND ABDOMEN;  Surgeon: Armandina Gemma, MD;  Location: [place];  Service: General;  Laterality: N/A;  . laparotomy  06/2015  . LAPAROTOMY N/A 07/23/2018   Procedure: EXPLORATORY LAPAROTOMY;  Surgeon: Rolm Bookbinder, MD;  Location: [place];  Service: General;  Laterality: N/A;  . LEFT HEART CATH AND CORONARY ANGIOGRAPHY N/A 09/27/2016   Procedure: Left Heart Cath and Coronary Angiography;  Surgeon: Sherren Mocha, MD;  Location: [place] [facility];  Service: Cardiovascular;  Laterality: N/A;  . TONSILLECTOMY      Current Outpatient Medications  Medication Sig Dispense Refill  . acetaminophen (TYLENOL) 500 MG tablet Take 1,000 mg by mouth every 6 (six) hours as needed for mild pain.     Marland Kitchen apixaban (ELIQUIS) 2.5 MG TABS tablet Take 1 tablet (2.5  mg total) by mouth 2 (two) times daily. 60 tablet 5  . Calcium Carb-Cholecalciferol (CALCIUM + D3 PO) Take 1 tablet by mouth daily at 12 noon.    . Cholecalciferol (VITAMIN D3) 50 MCG (2000 UT) TABS Take 2,000 Units by mouth daily with lunch.    . diltiazem (CARDIZEM CD) 300 MG 24 hr capsule Take 1 capsule (300 mg total) by mouth daily. 30 capsule 2  . furosemide (LASIX) 40 MG tablet Take 1 tablet (40 mg total) by mouth daily. 90 tablet 3  . levothyroxine (SYNTHROID, LEVOTHROID) 75 MCG tablet Take 75 mcg by mouth daily before breakfast.    . metoprolol succinate (TOPROL XL) 100 MG 24 hr tablet Take 1 tablet (100 mg total) by mouth 2 (two) times daily. Take with or immediately following a meal. 60 tablet 2  . Multiple Vitamin (MULTIVITAMIN WITH MINERALS) TABS tablet Take 1 tablet by mouth daily.    Marland Kitchen NITROSTAT 0.4 MG SL tablet Place 1 tablet (0.4 mg total) under the tongue every 5 (five) minutes as needed. Chest pain 25 tablet 6  . Polyethyl Glycol-Propyl Glycol (SYSTANE ULTRA) 0.4-0.3 % SOLN Place 1-2 drops into both eyes every other day.     . potassium chloride (KLOR-CON) 10 MEQ tablet Take 1 tablet (10 mEq total) by mouth daily. 90 tablet 3  . rosuvastatin (CRESTOR) 10 MG tablet Take 1 tablet by mouth once daily 90 tablet 3  . sertraline (ZOLOFT) 25 MG tablet Take 25 mg by mouth daily.     No current facility-administered medications for this visit.    Allergies:   Propofol   Social History:  The patient  reports that she has never smoked. She has never used smokeless tobacco. She reports previous alcohol use. She reports that she does not use drugs.   Family History:  The patient's family history includes Cancer in her sister; Heart attack (age of onset: 61) in her father; Stroke in her mother.  ROS:  Please see the history of present illness.  All other systems are reviewed and otherwise negative.   PHYSICAL EXAM:  VS:  There were no vitals taken for this visit. BMI: There is no  height or weight on file to calculate BMI. Well nourished, well developed, in no acute distress  HEENT: normocephalic, atraumatic  Neck: no JVD, carotid bruits or masses Cardiac:  irreg-irreg, no significant murmurs, no rubs, or gallops Lungs:  CTA b/l no wheezing, rhonchi or rales  Abd: soft, nontender MS: no deformity, age appropriate atrophy  Ext: no edema  Skin: warm and dry, no rash Neuro:  No gross deficits appreciated Psych: euthymic mood, full affect   EKG:  Not done today 09/12/2019 Afib 110bpm, no ST/T changes   Echocardiogram 08/22/2019: Impressions: 1. Left ventricular ejection fraction, by estimation, is 45 to 50%. The  left ventricle has mildly decreased function. The left ventricle  demonstrates global hypokinesis. Left ventricular diastolic function could  not be evaluated.  2. Right ventricular systolic function is mildly reduced. The right  ventricular size is normal. There is mildly elevated pulmonary artery  systolic pressure. The estimated right ventricular systolic pressure is  56.8 mmHg.  3. Left atrial size was severely dilated.  4. Right atrial size was moderately dilated.  5. The mitral valve is myxomatous. Moderate mitral valve regurgitation.  6. The aortic valve is normal in structure. Aortic valve regurgitation is  trivial. Mild aortic valve sclerosis is present, with no evidence of  aortic valve stenosis.  7. Aortic dilatation noted. There is borderline dilatation of the  ascending aorta measuring 38 mm.  8. The inferior vena cava is dilated in size with <50% respiratory  variability, suggesting right atrial pressure of 15 mmHg.    Echocardiogram 04/21/2018: 1. The left ventricle has normal systolic function, with an ejection  fraction of 55-60%. The cavity size was normal. Mild basal septal  hypertrophy. Left ventricular diastology could not be evaluated secondary  to atrial fibrillation.  2. The right ventricle has normal systolic  function. The cavity was  normal. There is no increase in right ventricular wall thickness.  3. Left atrial size was moderately dilated.  4. The mitral valve is normal in structure.  5. The tricuspid valve is normal in structure. Tricuspid valve  regurgitation is mild-moderate.  6. The aortic valve is tricuspid Mild sclerosis of the aortic valve.  7. The pulmonic valve was normal in structure. Pulmonic valve  regurgitation is mild by color flow Doppler.  8. Right atrial pressure is estimated at 3 mmHg.   Cardiac catheterization 09/27/2016: 1. Single-vessel coronary artery disease with patency of the proximal LAD stent and mild nonobstructive stenosis in the mid vessel 2. Widely patent left main, left circumflex, and RCA with no significant obstructive lesions. 3. Normal LV function with estimated LVEF 55-65%   Recent Labs: 06/16/2019: ALT 14 08/21/2019: TSH 11.653 08/22/2019: Hemoglobin 10.6; Platelets 239 08/24/2019: Magnesium 1.7 09/12/2019: BUN 20; Creatinine, Ser 0.63; Potassium 4.1; Sodium 134  08/22/2019: Cholesterol 85; HDL 45; LDL Cholesterol 29; Total CHOL/HDL Ratio 1.9; Triglycerides 53; VLDL 11   CrCl cannot be calculated (Patient's most recent lab result is older than the maximum 21 days allowed.).   Wt Readings from Last 3 Encounters:  09/16/19 126 lb 12.8 oz (57.5 kg)  09/12/19 134 lb (60.8 kg)  08/25/19 131 lb 8 oz (59.6 kg)     Other studies reviewed: Additional studies/records reviewed today include: summarized above  ASSESSMENT AND PLAN:  1. Longstanding persistent >> permanent AFib     CHA2DS2Vasc is 8, on Eliquis, low dose.     Her weight wobbles 57.5kg in July, 60.3 today.  62.2 when she saw Dr. Acie Fredrickson, though I think mostly this is volume up/down, and her dry weight is probably <60kg     Rates at home and today are good 70's-90's  2. CM     Suspect tachy-mediated     Will give her daily PRN lasix 20mg , we discussed when to use it, weighing daily, if  3lbs in a  day or 5 in a week.       She is asked to let us know if using regularly      Her exam does not suggest volume OL, her weight is down from last week      BMET today with the switch to spironolactone last week.  3. CAD    She denies CP    C/w Dr. Acie Fredrickson  4. HTN     Looks OK     Disposition:  She is due to see Dr. Curt Bears in a few months, will keep that as planned.  See her sooner if needed.  Current medicines are reviewed at length with the patient today.  The patient did not have any concerns regarding medicines.  Venetia Night, PA-C 12/16/2019 11:13 AM     [facility] [address] [place] [address] [phone] (office)  [phone] (fax)

## 2019-12-17 ENCOUNTER — Other Ambulatory Visit: Payer: Self-pay

## 2019-12-17 ENCOUNTER — Encounter: Payer: Self-pay | Admitting: Cardiovascular Disease

## 2019-12-17 ENCOUNTER — Ambulatory Visit: Payer: Medicare HMO | Admitting: Cardiovascular Disease

## 2019-12-17 VITALS — BP 132/78 | HR 80 | Ht 62.0 in | Wt 137.2 lb

## 2019-12-17 DIAGNOSIS — I2510 Atherosclerotic heart disease of native coronary artery without angina pectoris: Secondary | ICD-10-CM | POA: Diagnosis not present

## 2019-12-17 DIAGNOSIS — I4819 Other persistent atrial fibrillation: Secondary | ICD-10-CM | POA: Diagnosis not present

## 2019-12-17 DIAGNOSIS — E782 Mixed hyperlipidemia: Secondary | ICD-10-CM | POA: Diagnosis not present

## 2019-12-17 DIAGNOSIS — I4821 Permanent atrial fibrillation: Secondary | ICD-10-CM | POA: Diagnosis not present

## 2019-12-17 LAB — BASIC METABOLIC PANEL
BUN/Creatinine Ratio: 20 (ref 12–28)
BUN: 13 mg/dL (ref 8–27)
CO2: 24 mmol/L (ref 20–29)
Calcium: 9.3 mg/dL (ref 8.7–10.3)
Chloride: 101 mmol/L (ref 96–106)
Creatinine, Ser: 0.64 mg/dL (ref 0.57–1.00)
GFR calc Af Amer: 94 mL/min/{1.73_m2} (ref >59)
GFR calc non Af Amer: 82 mL/min/{1.73_m2} (ref >59)
Glucose: 98 mg/dL (ref 65–99)
Potassium: 4.9 mmol/L (ref 3.5–5.2)
Sodium: 137 mmol/L (ref 134–144)

## 2019-12-17 MED ORDER — SPIRONOLACTONE 25 MG PO TABS
25.0000 mg | ORAL_TABLET | Freq: Every day | ORAL | 3 refills | Status: DC
Start: 2019-12-17 — End: 2020-12-06

## 2019-12-17 NOTE — Patient Instructions (Addendum)
Medication Instructions:  Your physician has recommended you make the following change in your medication:  1.) stop furosemide (Lasix) 2.) stop potassium 3.) start spironolactone (Aldactone) 25 mg daily  *If you need a refill on your cardiac medications before your next appointment, please call your pharmacy*   Lab Work: Today: BMET In 2-3 weeks -BMET In 6 months before next visit with Dr. Acie Fredrickson -Lipids/Liver/BMET  If you have labs (blood work) drawn today and your tests are completely normal, you will receive your results only by: Marland Kitchen MyChart Message (if you have MyChart) OR . A paper copy in the mail If you have any lab test that is abnormal or we need to change your treatment, we will call you to review the results.   Testing/Procedures: none   Follow-Up: At [facility], you and your health needs are our priority.  As part of our continuing mission to provide you with exceptional heart care, we have created designated Provider Care Teams.  These Care Teams include your primary Cardiologist (physician) and Advanced Practice Providers (APPs -  Physician Assistants and Nurse Practitioners) who all work together to provide you with the care you need, when you need it.   Your next appointment:   6 month(s)  The format for your next appointment:   In Person  Provider:   You may see Mertie Moores, MD or one of the following Advanced Practice Providers on your designated Care Team:    Richardson Dopp, PA-C  Robbie Lis, [place]    Other Instructions Call or MyChart Dr. Acie Fredrickson in about 2-3 weeks with an update on how you are feeling after this medicine change.

## 2019-12-22 ENCOUNTER — Telehealth: Payer: Self-pay | Admitting: *Deleted

## 2019-12-22 ENCOUNTER — Other Ambulatory Visit: Payer: Self-pay

## 2019-12-22 ENCOUNTER — Ambulatory Visit: Payer: Medicare HMO | Admitting: Physician Assistant

## 2019-12-22 VITALS — BP 128/66 | HR 78 | Ht 62.0 in | Wt 133.0 lb

## 2019-12-22 DIAGNOSIS — I5022 Chronic systolic (congestive) heart failure: Secondary | ICD-10-CM | POA: Diagnosis not present

## 2019-12-22 DIAGNOSIS — E875 Hyperkalemia: Secondary | ICD-10-CM

## 2019-12-22 DIAGNOSIS — I10 Essential (primary) hypertension: Secondary | ICD-10-CM | POA: Diagnosis not present

## 2019-12-22 DIAGNOSIS — I4821 Permanent atrial fibrillation: Secondary | ICD-10-CM

## 2019-12-22 DIAGNOSIS — Z79899 Other long term (current) drug therapy: Secondary | ICD-10-CM

## 2019-12-22 LAB — BASIC METABOLIC PANEL
BUN/Creatinine Ratio: 18 (ref 12–28)
BUN: 11 mg/dL (ref 8–27)
CO2: 23 mmol/L (ref 20–29)
Calcium: 9.5 mg/dL (ref 8.7–10.3)
Chloride: 103 mmol/L (ref 96–106)
Creatinine, Ser: 0.62 mg/dL (ref 0.57–1.00)
GFR calc Af Amer: 95 mL/min/{1.73_m2} (ref >59)
GFR calc non Af Amer: 82 mL/min/{1.73_m2} (ref >59)
Glucose: 106 mg/dL — ABNORMAL HIGH (ref 65–99)
Potassium: 5.4 mmol/L — ABNORMAL HIGH (ref 3.5–5.2)
Sodium: 135 mmol/L (ref 134–144)

## 2019-12-22 MED ORDER — FUROSEMIDE 20 MG PO TABS
20.0000 mg | ORAL_TABLET | ORAL | 0 refills | Status: DC | PRN
Start: 2019-12-22 — End: 2019-12-22

## 2019-12-22 MED ORDER — FUROSEMIDE 20 MG PO TABS: 20.0000 mg | ORAL_TABLET | ORAL | 1 refills | Status: DC

## 2019-12-22 NOTE — Telephone Encounter (Signed)
Spoke with the pt and endorsed to her, her labs results and recommendations, per Tommye Standard PA-C.  Advised the pt to start taking her lasix 20 mg po every other day, and she may take an additional lasix as needed for weight gain of 3 lbs in a 24 hr time period, or weight gain of 5 lbs in a week.  Also endorsed to the pt that Renee would like for her to come into the office for another BMET in one week, with this med change.  Scheduled the pt to come in on 10/26 for repeat BMET.  Pt did confirm with me that she is indeed, not taking her KDUR anymore.  Pt verbalized understanding and agrees with this plan. Pt was more than gracious for all the assistance provided.

## 2019-12-22 NOTE — Telephone Encounter (Signed)
Spoke with Tommye Standard PA-C about pts lab results showing elevated K level of 5.4.  Renee saw the pt today in clinic.  Per [facility], pt should remain off of all potassium supplements (which was previously stopped), take her lasix 20 mg po every other day, and come in for repeat BMET in one week.  Will call the pt now and endorse these changes.

## 2019-12-22 NOTE — Patient Instructions (Addendum)
Medication Instructions:   START TAKING:   1.  FUROSEMIDE (LASIX) 20 MG AS NEEDED FOR WEIGHT GAIN OF 3LBS IN 24 HOURS OR 5LBS IN A                WEEK  (CONTACT OFFICE IF START TO TAKE REGULARY)  *If you need a refill on your cardiac medications before your next appointment, please call your pharmacy*   Lab Work: BMET TODAY   If you have labs (blood work) drawn today and your tests are completely normal, you will receive your results only by: Marland Kitchen MyChart Message (if you have MyChart) OR . A paper copy in the mail If you have any lab test that is abnormal or we need to change your treatment, we will call you to review the results.   Testing/Procedures: NONE ORDERED  TODAY   Follow-Up: At [facility], you and your health needs are our priority.  As part of our continuing mission to provide you with exceptional heart care, we have created designated Provider Care Teams.  These Care Teams include your primary Cardiologist (physician) and Advanced Practice Providers (APPs -  Physician Assistants and Nurse Practitioners) who all work together to provide you with the care you need, when you need it.  We recommend signing up for the patient portal called "MyChart".  Sign up information is provided on this After Visit Summary.  MyChart is used to connect with patients for Virtual Visits (Telemedicine).  Patients are able to view lab/test results, encounter notes, upcoming appointments, etc.  Non-urgent messages can be sent to your provider as well.   To learn more about what you can do with MyChart, go to [email or web address].    Your next appointment:   3 month(s)  The format for your next appointment:   In Person  Provider:   You may see Will Meredith Leeds, MD or one of the following Advanced Practice Providers on your designated Care Team:    Chanetta Marshall, NP  Tommye Standard, PA-C  Legrand Como "Oda Kilts, [place]    Other Instructions

## 2019-12-22 NOTE — Telephone Encounter (Signed)
-----   Message from [place], [place] sent at 12/22/2019  4:26 PM EDT ----- K+ is just a bit high, please make sure she is not taking the K+ any more (I dont think she is base on today's visit), have her take lasix 20mg  QOD (she can still take an additional dose PRN for weight gain as we discussed today) and repeat BMET in a bout a week  Thanks renee

## 2019-12-30 ENCOUNTER — Other Ambulatory Visit: Payer: Self-pay

## 2019-12-30 ENCOUNTER — Other Ambulatory Visit: Payer: Medicare HMO | Admitting: *Deleted

## 2019-12-30 DIAGNOSIS — Z79899 Other long term (current) drug therapy: Secondary | ICD-10-CM | POA: Diagnosis not present

## 2019-12-30 DIAGNOSIS — R300 Dysuria: Secondary | ICD-10-CM | POA: Diagnosis not present

## 2019-12-30 DIAGNOSIS — E875 Hyperkalemia: Secondary | ICD-10-CM

## 2019-12-30 LAB — BASIC METABOLIC PANEL
BUN/Creatinine Ratio: 20 (ref 12–28)
BUN: 15 mg/dL (ref 8–27)
CO2: 20 mmol/L (ref 20–29)
Calcium: 9.5 mg/dL (ref 8.7–10.3)
Chloride: 99 mmol/L (ref 96–106)
Creatinine, Ser: 0.74 mg/dL (ref 0.57–1.00)
GFR calc Af Amer: 85 mL/min/{1.73_m2} (ref >59)
GFR calc non Af Amer: 74 mL/min/{1.73_m2} (ref >59)
Glucose: 93 mg/dL (ref 65–99)
Potassium: 5.3 mmol/L — ABNORMAL HIGH (ref 3.5–5.2)
Sodium: 134 mmol/L (ref 134–144)

## 2019-12-31 ENCOUNTER — Other Ambulatory Visit: Payer: Self-pay | Admitting: *Deleted

## 2019-12-31 DIAGNOSIS — Z79899 Other long term (current) drug therapy: Secondary | ICD-10-CM

## 2020-01-05 DIAGNOSIS — E7800 Pure hypercholesterolemia, unspecified: Secondary | ICD-10-CM | POA: Diagnosis not present

## 2020-01-05 DIAGNOSIS — E039 Hypothyroidism, unspecified: Secondary | ICD-10-CM | POA: Diagnosis not present

## 2020-01-05 DIAGNOSIS — I25119 Atherosclerotic heart disease of native coronary artery with unspecified angina pectoris: Secondary | ICD-10-CM | POA: Diagnosis not present

## 2020-01-05 DIAGNOSIS — I480 Paroxysmal atrial fibrillation: Secondary | ICD-10-CM | POA: Diagnosis not present

## 2020-01-05 DIAGNOSIS — M8580 Other specified disorders of bone density and structure, unspecified site: Secondary | ICD-10-CM | POA: Diagnosis not present

## 2020-01-05 DIAGNOSIS — I10 Essential (primary) hypertension: Secondary | ICD-10-CM | POA: Diagnosis not present

## 2020-01-07 ENCOUNTER — Other Ambulatory Visit: Payer: Self-pay | Admitting: Cardiovascular Disease

## 2020-01-20 ENCOUNTER — Other Ambulatory Visit: Payer: Self-pay

## 2020-01-20 ENCOUNTER — Other Ambulatory Visit: Payer: Medicare HMO

## 2020-01-20 DIAGNOSIS — Z79899 Other long term (current) drug therapy: Secondary | ICD-10-CM

## 2020-01-20 LAB — BASIC METABOLIC PANEL
BUN/Creatinine Ratio: 21 (ref 12–28)
BUN: 12 mg/dL (ref 8–27)
CO2: 27 mmol/L (ref 20–29)
Calcium: 9.1 mg/dL (ref 8.7–10.3)
Chloride: 96 mmol/L (ref 96–106)
Creatinine, Ser: 0.57 mg/dL (ref 0.57–1.00)
GFR calc Af Amer: 98 mL/min/{1.73_m2} (ref >59)
GFR calc non Af Amer: 85 mL/min/{1.73_m2} (ref >59)
Glucose: 105 mg/dL — ABNORMAL HIGH (ref 65–99)
Potassium: 4.5 mmol/L (ref 3.5–5.2)
Sodium: 134 mmol/L (ref 134–144)

## 2020-01-26 ENCOUNTER — Telehealth: Payer: Self-pay | Admitting: Cardiovascular Disease

## 2020-01-26 NOTE — Telephone Encounter (Signed)
Patient is requesting to discuss lab results. 

## 2020-01-26 NOTE — Telephone Encounter (Signed)
Spoke with the patient and reviewed lab results with her. She states that she has been having some SOB and has not been feeling well. She has noticed some weight gain and swelling in her ankles. She states that on days she takes her Lasix she does seem to feel a bit better. I advised her that she can take an extra 20mg  Lasix for weight gain. Patient verbalized understanding and will let us know if she continues to need extra Lasix or symptoms worsen.

## 2020-01-27 NOTE — Telephone Encounter (Signed)
I agree with note by Dala Dock, RN that Ms. Woodhead may take additional Lasix as needed for dyspnea and leg swelling .

## 2020-03-03 DIAGNOSIS — D6869 Other thrombophilia: Secondary | ICD-10-CM | POA: Diagnosis not present

## 2020-03-03 DIAGNOSIS — I480 Paroxysmal atrial fibrillation: Secondary | ICD-10-CM | POA: Diagnosis not present

## 2020-03-03 DIAGNOSIS — Z79899 Other long term (current) drug therapy: Secondary | ICD-10-CM | POA: Diagnosis not present

## 2020-03-03 DIAGNOSIS — I700 Atherosclerosis of aorta: Secondary | ICD-10-CM | POA: Diagnosis not present

## 2020-03-03 DIAGNOSIS — I10 Essential (primary) hypertension: Secondary | ICD-10-CM | POA: Diagnosis not present

## 2020-03-11 ENCOUNTER — Encounter: Payer: Self-pay | Admitting: Cardiology

## 2020-03-11 ENCOUNTER — Other Ambulatory Visit: Payer: Self-pay

## 2020-03-11 ENCOUNTER — Ambulatory Visit (INDEPENDENT_AMBULATORY_CARE_PROVIDER_SITE_OTHER): Payer: Medicare HMO | Admitting: Cardiology

## 2020-03-11 VITALS — BP 110/74 | HR 85 | Ht 62.0 in | Wt 133.6 lb

## 2020-03-11 DIAGNOSIS — I4821 Permanent atrial fibrillation: Secondary | ICD-10-CM

## 2020-03-11 NOTE — Progress Notes (Signed)
Electrophysiology Office Note   Date:  03/11/2020   ID:  [name], DOB [birth year], MRN [id number]  PCP:  Merlene Laughter, MD  Cardiologist:  Laurel Dimmer Primary Electrophysiologist:  Regan Lemming, MD    No chief complaint on file.    History of Present Illness: Rachel Park is a 87 y.o. female who is being seen today for the evaluation of atrial fibrillation at the request of Merlene Laughter, MD. Presenting today for electrophysiology evaluation.    She has a history of coronary artery disease status post LAD stent complicated by CVA in 2016 and atrial fibrillation.  She was previously admitted with rapid atrial fibrillation.  She presented with abdominal pain and was found to have a renal infarctions.  She was placed on Eliquis and sotalol.  She converted back to sinus rhythm at the time.  Unfortunately she has had more episodes of atrial fibrillation.  She is on no rhythm control medications at this time.  Today, denies symptoms of palpitations, chest pain, shortness of breath, orthopnea, PND, lower extremity edema, claudication, dizziness, presyncope, syncope, bleeding, or neurologic sequela. The patient is tolerating medications without difficulties.  Her main complaint today is fatigue.  She feels that her fatigue is likely due to her atrial fibrillation.  She is able to do all of her daily activities though with a little bit more rest.  Aside from that, she is doing well and is without major complaints.  Past Medical History:  Diagnosis Date  . Arthritis   . Complication of anesthesia   . Coronary artery disease   . Hypertension   . Hypothyroidism   . PAF (paroxysmal atrial fibrillation) (HCC)    on Xarelto for maybe a year, opted to stop  . PONV (postoperative nausea and vomiting)   . Renal infarction (HCC)   . Small bowel obstruction (HCC) 06/2015  . Stroke [facility])    oct 2016  . TIA (transient ischemic attack) 03/24/2015  . Vertigo, benign positional    to be  evaluated, intermittent   Past Surgical History:  Procedure Laterality Date  . ABDOMINAL HYSTERECTOMY    . APPLICATION OF WOUND VAC N/A 07/23/2018   Procedure: APPLICATION OF WOUND VAC;  Surgeon: Emelia Loron, MD;  Location: [facility] OR;  Service: General;  Laterality: N/A;  . BOWEL RESECTION N/A 07/23/2018   Procedure: SMALL BOWEL RESECTION;  Surgeon: Emelia Loron, MD;  Location: [facility] OR;  Service: General;  Laterality: N/A;  . CARDIAC CATHETERIZATION N/A 12/24/2014   Procedure: Left Heart Cath and Coronary Angiography;  Surgeon: Tonny Bollman, MD;  Location: [facility] INVASIVE CV LAB;  Service: Cardiovascular;  Laterality: N/A;  . CARDIOVERSION N/A 05/22/2018   Procedure: CARDIOVERSION;  Surgeon: Wendall Stade, MD;  Location: [facility] ENDOSCOPY;  Service: Cardiovascular;  Laterality: N/A;  . CATARACT EXTRACTION Bilateral   . COLONOSCOPY WITH PROPOFOL N/A 12/01/2013   Procedure: COLONOSCOPY WITH PROPOFOL;  Surgeon: Charolett Bumpers, MD;  Location: [facility] ENDOSCOPY;  Service: Endoscopy;  Laterality: N/A;  . COMPLEX WOUND CLOSURE  07/25/2018   Procedure: Complex Wound Closure;  Surgeon: Emelia Loron, MD;  Location: [facility] OR;  Service: General;;  . Ferne Coe  07/25/2018   Procedure: Ileocecetomy;  Surgeon: Emelia Loron, MD;  Location[facility] OR;  Service: General;;  . INCISIONAL HERNIA REPAIR N/A 03/10/2016   Procedure: INCISIONAL HERNIA REPAIR TIMES TWO;  Surgeon: Darnell Level, MD;  Location: [facility] OR;  Service: General;  Laterality: N/A;  . INGUINAL HERNIA REPAIR Bilateral 03/10/2016  Procedure: BILATERAL INGUINAL HERNIA REPAIRS;  Surgeon: Darnell Level, MD;  Location: [facility] OR;  Service: General;  Laterality: Bilateral;  . INSERTION OF MESH N/A 03/10/2016   Procedure: INSERTION OF MESH TO BILATERAL GROINS AND ABDOMEN;  Surgeon: Darnell Level, MD;  Location: [facility] OR;  Service: General;  Laterality: N/A;  . laparotomy  06/2015  . LAPAROTOMY N/A 07/23/2018   Procedure: EXPLORATORY LAPAROTOMY;  Surgeon: Emelia Loron, MD;  Location: [facility] OR;  Service: General;  Laterality: N/A;  . LEFT HEART CATH AND CORONARY ANGIOGRAPHY N/A 09/27/2016   Procedure: Left Heart Cath and Coronary Angiography;  Surgeon: Tonny Bollman, MD;  Location: [facility] INVASIVE CV LAB;  Service: Cardiovascular;  Laterality: N/A;  . TONSILLECTOMY       Current Outpatient Medications  Medication Sig Dispense Refill  . acetaminophen (TYLENOL) 500 MG tablet Take 1,000 mg by mouth every 6 (six) hours as needed for mild pain.     Marland Kitchen apixaban (ELIQUIS) 2.5 MG TABS tablet Take 1 tablet (2.5 mg total) by mouth 2 (two) times daily. 60 tablet 5  . Calcium Carb-Cholecalciferol (CALCIUM + D3 PO) Take 1 tablet by mouth daily at 12 noon.    . Cholecalciferol (VITAMIN D3) 50 MCG (2000 UT) TABS Take 2,000 Units by mouth daily with lunch.    . diltiazem (CARDIZEM CD) 300 MG 24 hr capsule Take 1 capsule (300 mg total) by mouth daily. 30 capsule 2  . furosemide (LASIX) 20 MG tablet Take 20 mg by mouth daily.    Marland Kitchen levothyroxine (SYNTHROID) 88 MCG tablet Take 88 mcg by mouth every morning.    . metoprolol succinate (TOPROL-XL) 100 MG 24 hr tablet TAKE 1 TABLET BY MOUTH TWICE DAILY WITH A MEAL OR  IMMEDIATELY  FOLLOWING  A  MEAL. 60 tablet 8  . Multiple Vitamin (MULTIVITAMIN WITH MINERALS) TABS tablet Take 1 tablet by mouth daily.    Marland Kitchen NITROSTAT 0.4 MG SL tablet Place 1 tablet (0.4 mg total) under the tongue every 5 (five) minutes as needed. Chest pain 25 tablet 6  . Polyethyl Glycol-Propyl Glycol 0.4-0.3 % SOLN Place 1-2 drops into both eyes every other day.    . rosuvastatin (CRESTOR) 10 MG tablet Take 1 tablet by mouth once daily 90 tablet 3  . sertraline (ZOLOFT) 25 MG tablet Take 25 mg by mouth daily.    Marland Kitchen spironolactone (ALDACTONE) 25 MG tablet Take 1 tablet (25 mg total) by mouth daily. 90 tablet 3   No current facility-administered medications for this visit.    Allergies:   Propofol and Other   Social History:  The patient  reports that she has  never smoked. She has never used smokeless tobacco. She reports previous alcohol use. She reports that she does not use drugs.   Family History:  The patient's family history includes Cancer in her sister; Heart attack (age of onset: 10) in her father; Stroke in her mother.   ROS:  Please see the history of present illness.   Otherwise, review of systems is positive for none.   All other systems are reviewed and negative.   PHYSICAL EXAM: VS:  BP 110/74   Pulse 85   Ht 5\' 2"  (1.575 m)   Wt 133 lb 9.6 oz (60.6 kg)   SpO2 97%   BMI 24.44 kg/m  , BMI Body mass index is 24.44 kg/m. GEN: Well nourished, well developed, in no acute distress  HEENT: normal  Neck: no JVD, carotid bruits, or masses Cardiac: irregular; no  murmurs, rubs, or gallops,no edema  Respiratory:  clear to auscultation bilaterally, normal work of breathing GI: soft, nontender, nondistended, + BS MS: no deformity or atrophy  Skin: warm and dry Neuro:  Strength and sensation are intact Psych: euthymic mood, full affect  EKG:  EKG is ordered today. Personal review of the ekg ordered shows atrial fibrillation, rate 86  Recent Labs: 06/16/2019: ALT 14 08/21/2019: TSH 11.653 08/22/2019: Hemoglobin 10.6; Platelets 239 08/24/2019: Magnesium 1.7 01/20/2020: BUN 12; Creatinine, Ser 0.57; Potassium 4.5; Sodium 134    Lipid Panel     Component Value Date/Time   CHOL 85 08/22/2019 0552   CHOL 124 06/16/2019 1105   TRIG 53 08/22/2019 0552   HDL 45 08/22/2019 0552   HDL 72 06/16/2019 1105   CHOLHDL 1.9 08/22/2019 0552   VLDL 11 08/22/2019 0552   LDLCALC 29 08/22/2019 0552   LDLCALC 37 06/16/2019 1105     Wt Readings from Last 3 Encounters:  03/11/20 133 lb 9.6 oz (60.6 kg)  12/22/19 133 lb (60.3 kg)  12/17/19 137 lb 3.2 oz (62.2 kg)      Other studies Reviewed: Additional studies/ records that were reviewed today include: TTE 04/21/18  Review of the above records today demonstrates:   1. The left ventricle has  normal systolic function, with an ejection fraction of 55-60%. The cavity size was normal. Mild basal septal hypertrophy. Left ventricular diastology could not be evaluated secondary to atrial fibrillation.  2. The right ventricle has normal systolic function. The cavity was normal. There is no increase in right ventricular wall thickness.  3. Left atrial size was moderately dilated.  4. The mitral valve is normal in structure.  5. The tricuspid valve is normal in structure. Tricuspid valve regurgitation is mild-moderate.  6. The aortic valve is tricuspid Mild sclerosis of the aortic valve.  7. The pulmonic valve was normal in structure. Pulmonic valve regurgitation is mild by color flow Doppler.  8. Right atrial pressure is estimated at 3 mmHg.   ASSESSMENT AND PLAN:  1.  Permanent atrial fibrillation: Currently on diltiazem, Toprol-XL and Eliquis.  CHA2DS2-VASc of 6.  She is currently well rate controlled with good rates at home.  We Kealan Buchan continue her current medications.  She does have some fatigue that is likely due to her atrial fibrillation.  2.  Coronary artery disease: Status post LAD stent in [id number] complicated by CVA.  No current chest pain.    Current medicines are reviewed at length with the patient today.   The patient does not have concerns regarding her medicines.  The following changes were made today: None  Labs/ tests ordered today include:  Orders Placed This Encounter  Procedures  . EKG 12-Lead    Disposition:   FU with Braeley Buskey 6 months  Signed, Jolyne Laye Meredith Leeds, MD  03/11/2020 9:07 AM     [facility] [address] [place] [address] [phone] (office) [phone] (fax)

## 2020-03-11 NOTE — Patient Instructions (Signed)
Medication Instructions:  Your physician recommends that you continue on your current medications as directed. Please refer to the Current Medication list given to you today.  *If you need a refill on your cardiac medications before your next appointment, please call your pharmacy*   Lab Work: None ordered   Testing/Procedures: None ordered   Follow-Up: At [facility], you and your health needs are our priority.  As part of our continuing mission to provide you with exceptional heart care, we have created designated Provider Care Teams.  These Care Teams include your primary Cardiologist (physician) and Advanced Practice Providers (APPs -  Physician Assistants and Nurse Practitioners) who all work together to provide you with the care you need, when you need it.   Your next appointment:   6 month(s)  The format for your next appointment:   In Person  Provider:   You will see one of the following Advanced Practice Providers on your designated Care Team:    Amber Seiler, NP  Renee Ursuy, PA-C  Michael "Andy" Tillery, PA-C    Thank you for choosing [facility]!!   Mostafa Yuan, RN [phone]     

## 2020-03-26 DIAGNOSIS — E7800 Pure hypercholesterolemia, unspecified: Secondary | ICD-10-CM | POA: Diagnosis not present

## 2020-03-26 DIAGNOSIS — E039 Hypothyroidism, unspecified: Secondary | ICD-10-CM | POA: Diagnosis not present

## 2020-03-26 DIAGNOSIS — I25119 Atherosclerotic heart disease of native coronary artery with unspecified angina pectoris: Secondary | ICD-10-CM | POA: Diagnosis not present

## 2020-03-26 DIAGNOSIS — I480 Paroxysmal atrial fibrillation: Secondary | ICD-10-CM | POA: Diagnosis not present

## 2020-03-26 DIAGNOSIS — I10 Essential (primary) hypertension: Secondary | ICD-10-CM | POA: Diagnosis not present

## 2020-03-26 DIAGNOSIS — M8580 Other specified disorders of bone density and structure, unspecified site: Secondary | ICD-10-CM | POA: Diagnosis not present

## 2020-03-29 DIAGNOSIS — I10 Essential (primary) hypertension: Secondary | ICD-10-CM | POA: Diagnosis not present

## 2020-05-25 DIAGNOSIS — I480 Paroxysmal atrial fibrillation: Secondary | ICD-10-CM | POA: Diagnosis not present

## 2020-05-25 DIAGNOSIS — E039 Hypothyroidism, unspecified: Secondary | ICD-10-CM | POA: Diagnosis not present

## 2020-05-25 DIAGNOSIS — I10 Essential (primary) hypertension: Secondary | ICD-10-CM | POA: Diagnosis not present

## 2020-05-25 DIAGNOSIS — M8580 Other specified disorders of bone density and structure, unspecified site: Secondary | ICD-10-CM | POA: Diagnosis not present

## 2020-05-25 DIAGNOSIS — I25119 Atherosclerotic heart disease of native coronary artery with unspecified angina pectoris: Secondary | ICD-10-CM | POA: Diagnosis not present

## 2020-05-25 DIAGNOSIS — E7800 Pure hypercholesterolemia, unspecified: Secondary | ICD-10-CM | POA: Diagnosis not present

## 2020-06-08 ENCOUNTER — Encounter (INDEPENDENT_AMBULATORY_CARE_PROVIDER_SITE_OTHER): Payer: Medicare HMO | Admitting: Ophthalmology

## 2020-06-08 ENCOUNTER — Other Ambulatory Visit: Payer: Self-pay

## 2020-06-08 DIAGNOSIS — I10 Essential (primary) hypertension: Secondary | ICD-10-CM

## 2020-06-08 DIAGNOSIS — H35341 Macular cyst, hole, or pseudohole, right eye: Secondary | ICD-10-CM

## 2020-06-08 DIAGNOSIS — H43813 Vitreous degeneration, bilateral: Secondary | ICD-10-CM

## 2020-06-08 DIAGNOSIS — H35033 Hypertensive retinopathy, bilateral: Secondary | ICD-10-CM

## 2020-06-27 ENCOUNTER — Encounter: Payer: Self-pay | Admitting: Cardiovascular Disease

## 2020-06-27 NOTE — Progress Notes (Signed)
Cardiology Office Note   Date:  06/28/2020   ID:  Rachel Park, DOB [birth year], MRN [id number]  PCP:  Rachel Manes, MD  Cardiologist:   Rachel Moores, MD   Chief Complaint  Patient presents with  . Atrial Fibrillation    problem list 1. Coronary artery  disease-status post stenting of her proximal LAD 2.  CVA- occurred during the stenting 3.  Atrial Fibrillation    Previous notes  Rachel Park is a 87 y.o. female who presents for follow up of her stenting She has done well.  Is a bit anxious - may account for her elevated BP today .  Memory has improved ,  No residual CVA symptoms.   Feb. 1, 2017: Doing well Has some fatigue. Has some bruising on lower logs  -  On Plavix   June 21, 2015:  Rachel Park was diagnosed with atrial fib, was started on eliquis 5 mg BID.  She was in [place] with her sister. Developed abdominal pain .   Severe hypoxemia .   In great distress. Was found to have a small bowell perforation .   Was in the ICU for 2 days.   Developed hypotension , was started on levophed,   Developed atrial fib  plavix was stopped,   Heparin was started.   Has converted to NSR at this time  Has had some band like chest pain since that time Has shortness of breath - at rest and with exertion .     September 22, 2015:  Rachel Park is doing well.  We DC'd her Eliquis - due to bleeding.   She is on Plavix  Her only episode of a-fib was in the setting of a small bowel obstruction   Is very fatigued.   Was put on Iron tablet by Rachel Park. Hb is still low  Rachel Park also increased her Coreg to 25 BID .  BP readings at home have been well controlled.  Has not had any syncope Goes for a walk every day and is fatigued when she is done with the walk.   No CP .  Has occasional chest tightness .  Same symptoms back in April - Rachel Park was normal in April, 2017   Feb. 16, 2018:  Rachel Park is seen today . Has had surgery to repair 4 hernias.   Cant get her energy   Its only been a month - I reminded her that it takes time to heal after surgery .  No CP or dyspnea.   Sept. 24, 2018:  Rachel Park is seen today  Had some CP in July Repeat cath shows no obstructive CAD ,  LAD stent was widely patent  Has had some bursitis of left hip and knee.  No CP .   Thought to have reflux.   Tried Zantac, caused diarrhea, did not continue the zantac.   She has tried Crestor 10 mg in the past. But we had to decrease the dose to 5 g because of muscle aches. She thinks that she might be eating little bit more than she should.   May 23, 2017:  Doing well.  Has been working with the pharmacist at [facility] office  They are concerned about her HR in the 17s .  Has not had any syncope.   Has profound lack of energy - several hours after taking the coreg   Sept. 17, 2019: Rachel Park is seen for follow up of her CAD and PAF HR is typically  low. Has not had any further episodes of presyncope since we reduced the dose of Coreg. Still eats a bit more salt than she should   September 24, 2018  Rachel Park was admitted with ischemia bowel on Jul 23, 2018.   She had a prolonged ICU stay and developed AF with RVR.   She had a prolonged hospitalization with slow recovery.  She was started on Eliquis at the time of discharge.   She diuresed well   She was seen by Rachel Kicks, NP a month ago .  Seemed to be doing well,  Was started back on amiodarone. Her torsemide has been changed to PRN  Breathing is better.  Eating well.   Abdomen is better.   Aug. 19, 2020   Rachel Park is seen today . She was seen a month ago and was set up for a cardioversion.  She had been started on amiodarone and was in NSR when she showed up for her cardioverison She reported some tachycardia and generalized weakness  recently. She was thought to have developed recurrent AF and her amiodarone was increased to 200 mg BID .   Sept. 10, 2020  Rachel Park is seen for follow up of her atrial flutter. I saw her  several weeks ago and found her to be in atrial flutter.   We doubled her amiodarone and had her return today for follow up  Nov. 11 , 2020  Rachel Park is seen today for follow up of atrial flutter  Ankles have been swollen for the past month She had run out of her lasix that she had received from the hospital  She takes lasix 20 mg as needed - has not needed it for the past 3 weeks.   Jan. 12, 2021  Rachel Park is seen today for follow up of her CAD and atrial fib She has persistent AF.   We stopped her amiodarone several months ago and have gone with a rate control and anticoagulation strategy Has had more fatigue recently .    June 16, 2019:  Rachel Park is seen today for follow-up visit.  She has atrial fibrillation.  We have adopted a rate control and anticoagulation strategy.  Blood pressure and heart rate look good. Doesn't have much energy.  No real muscle weakness.  She walks every am for 40 minutes  Brought her HR and BP log.   Overall her HR and BP have been well controled.   August 11, 2019:  Rachel Park is seen today for a work in visit because of persistent rapid atrial fibrillation.  Her blood pressure is also  Low.  We had stopped her metoprolol and put her on diltiazem due to fatigue.  She has been on diltiazem but her heart rate has been not as well controlled..  Her fatigue is no different even off the metoprolol  She has no signs or symptoms of any underlying infection.  She is eating and drinking well.  Oct. 13, 2021: Rachel Park is seen today for follow up of her atrial fib, CAD She has continued to be fatigued She has been tried on tikosyn ( March 2020), failed to stay in rhythm.  Tried amiodarone.  She is more short of breath recently .  Has lots of fatigue  She has seen Rachel Park .  They discussed possible AV node ablation and pacer placement  Is on lasix for ankle edema  June 28, 2020: Rachel Park is seen today for follow up of her atrial fib Has chronic fatigue  Is not sleeping  well at night.   Wakes up 3-5 times at night  Her memory is not as good as she would like    Past Medical History:  Diagnosis Date  . Arthritis   . Complication of anesthesia   . Coronary artery disease   . Hypertension   . Hypothyroidism   . PAF (paroxysmal atrial fibrillation) ([place])    on Xarelto for maybe a year, opted to stop  . PONV (postoperative nausea and vomiting)   . Renal infarction ([place])   . Small bowel obstruction ([place]) 06/2015  . Stroke [facility])    oct 2016  . TIA (transient ischemic attack) 03/24/2015  . Vertigo, benign positional    to be evaluated, intermittent    Past Surgical History:  Procedure Laterality Date  . ABDOMINAL HYSTERECTOMY    . APPLICATION OF WOUND VAC N/A 07/23/2018   Procedure: APPLICATION OF WOUND VAC;  Surgeon: Rolm Bookbinder, MD;  Location: [place];  Service: General;  Laterality: N/A;  . BOWEL RESECTION N/A 07/23/2018   Procedure: SMALL BOWEL RESECTION;  Surgeon: Rolm Bookbinder, MD;  Location: [place];  Service: General;  Laterality: N/A;  . CARDIAC CATHETERIZATION N/A 12/24/2014   Procedure: Left Heart Cath and Coronary Angiography;  Surgeon: Sherren Mocha, MD;  Location: [place] CV LAB;  Service: Cardiovascular;  Laterality: N/A;  . CARDIOVERSION N/A 05/22/2018   Procedure: CARDIOVERSION;  Surgeon: Josue Hector, MD;  Location: [facility] ENDOSCOPY;  Service: Cardiovascular;  Laterality: N/A;  . CATARACT EXTRACTION Bilateral   . COLONOSCOPY WITH PROPOFOL N/A 12/01/2013   Procedure: COLONOSCOPY WITH PROPOFOL;  Surgeon: Garlan Fair, MD;  Location: [facility] ENDOSCOPY;  Service: Endoscopy;  Laterality: N/A;  . COMPLEX WOUND CLOSURE  07/25/2018   Procedure: Complex Wound Closure;  Surgeon: Rolm Bookbinder, MD;  Location: [place];  Service: General;;  . Pennie Rushing  07/25/2018   Procedure: Ileocecetomy;  Surgeon: Rolm Bookbinder, MD;  Location: [place];  Service: General;;  . INCISIONAL HERNIA REPAIR N/A 03/10/2016   Procedure: INCISIONAL HERNIA  REPAIR TIMES TWO;  Surgeon: Armandina Gemma, MD;  Location: [place];  Service: General;  Laterality: N/A;  . INGUINAL HERNIA REPAIR Bilateral 03/10/2016   Procedure: BILATERAL INGUINAL HERNIA REPAIRS;  Surgeon: Armandina Gemma, MD;  Location: [place];  Service: General;  Laterality: Bilateral;  . INSERTION OF MESH N/A 03/10/2016   Procedure: INSERTION OF MESH TO BILATERAL GROINS AND ABDOMEN;  Surgeon: Armandina Gemma, MD;  Location: [place];  Service: General;  Laterality: N/A;  . laparotomy  06/2015  . LAPAROTOMY N/A 07/23/2018   Procedure: EXPLORATORY LAPAROTOMY;  Surgeon: Rolm Bookbinder, MD;  Location: [place];  Service: General;  Laterality: N/A;  . LEFT HEART CATH AND CORONARY ANGIOGRAPHY N/A 09/27/2016   Procedure: Left Heart Cath and Coronary Angiography;  Surgeon: Sherren Mocha, MD;  Location: [place] CV LAB;  Service: Cardiovascular;  Laterality: N/A;  . TONSILLECTOMY       Current Outpatient Medications  Medication Sig Dispense Refill  . acetaminophen (TYLENOL) 500 MG tablet Take 1,000 mg by mouth every 6 (six) hours as needed for mild pain.     Marland Kitchen apixaban (ELIQUIS) 2.5 MG TABS tablet Take 1 tablet (2.5 mg total) by mouth 2 (two) times daily. 60 tablet 5  . Calcium Carb-Cholecalciferol (CALCIUM + D3) 600-800 MG-UNIT TABS Take 1 tablet by mouth daily.    . Cholecalciferol (VITAMIN D3) 50 MCG (2000 UT) TABS Take 2,000 Units by mouth daily with lunch.    Marland Kitchen  diltiazem (CARDIZEM CD) 300 MG 24 hr capsule Take 1 capsule (300 mg total) by mouth daily. 30 capsule 2  . furosemide (LASIX) 20 MG tablet Take 20 mg by mouth daily.    Marland Kitchen levothyroxine (SYNTHROID) 88 MCG tablet Take 88 mcg by mouth every morning.    . metoprolol succinate (TOPROL-XL) 100 MG 24 hr tablet TAKE 1 TABLET BY MOUTH TWICE DAILY WITH A MEAL OR  IMMEDIATELY  FOLLOWING  A  MEAL. 60 tablet 8  . NITROSTAT 0.4 MG SL tablet Place 1 tablet (0.4 mg total) under the tongue every 5 (five) minutes as needed. Chest pain 25 tablet 6  . Polyethyl  Glycol-Propyl Glycol 0.4-0.3 % SOLN Place 1-2 drops into both eyes every other day.    . rosuvastatin (CRESTOR) 10 MG tablet Take 1 tablet by mouth once daily 90 tablet 3  . spironolactone (ALDACTONE) 25 MG tablet Take 1 tablet (25 mg total) by mouth daily. 90 tablet 3   No current facility-administered medications for this visit.    Allergies:   Propofol and Other    Social History:  The patient  reports that she has never smoked. She has never used smokeless tobacco. She reports previous alcohol use. She reports that she does not use drugs.   Family History:  The patient's family history includes Cancer in her sister; Heart attack (age of onset: 21) in her father; Stroke in her mother.    ROS:      Physical Exam: Blood pressure 118/72, pulse 75, height 5\' 2"  (1.575 m), weight 139 lb 12.8 oz (63.4 kg), SpO2 98 %.  GEN:  Well nourished, well developed in no acute distress HEENT: Normal NECK: No JVD; No carotid bruits LYMPHATICS: No lymphadenopathy CARDIAC:  Irreg. Irreg.  RESPIRATORY:  Clear to auscultation without rales, wheezing or rhonchi  ABDOMEN: Soft, non-tender, non-distended MUSCULOSKELETAL:  No edema; No deformity  SKIN: Warm and dry NEUROLOGIC:  Alert and oriented x 3  EKG:       Recent Labs: 08/21/2019: TSH 11.653 08/22/2019: Hemoglobin 10.6; Platelets 239 08/24/2019: Magnesium 1.7 01/20/2020: BUN 12; Creatinine, Ser 0.57; Potassium 4.5; Sodium 134    Lipid Panel    Component Value Date/Time   CHOL 85 08/22/2019 0552   CHOL 124 06/16/2019 1105   TRIG 53 08/22/2019 0552   HDL 45 08/22/2019 0552   HDL 72 06/16/2019 1105   CHOLHDL 1.9 08/22/2019 0552   VLDL 11 08/22/2019 0552   LDLCALC 29 08/22/2019 0552   LDLCALC 37 06/16/2019 1105      Wt Readings from Last 3 Encounters:  06/28/20 139 lb 12.8 oz (63.4 kg)  03/11/20 133 lb 9.6 oz (60.6 kg)  12/22/19 133 lb (60.3 kg)      Other studies Reviewed: Additional studies/ records that were reviewed today  include: . Review of the above records demonstrates:    ASSESSMENT AND PLAN:  1.  CAD -       No angina.  Cont current meds.   2. Persistent  atrial fib/ atrial flutter  - CHADS2VASC score of 4-6  ( female, age > 68, vascular disease,   TIA and a CVA ( as a complication to her cath)     she wonders if she had a stroke during her hospitalization for her SBO .      3.  Pulmonary Hypertension:   Stable     3. Hyperlipidemia:    Stable,  Check labs today   5.  Hypertension::  BP looks great   Current medicines are reviewed at length with the patient today.  The patient does not have concerns regarding medicines.  The following changes have been made:  no change  Labs/ tests ordered today include:   No orders of the defined types were placed in this encounter.   Disposition:     Rachel Moores, MD  06/28/2020 9:03 AM    [address], [place], [place]  [address] Phone: [phone]; Fax: [phone]

## 2020-06-28 ENCOUNTER — Encounter: Payer: Self-pay | Admitting: Cardiovascular Disease

## 2020-06-28 ENCOUNTER — Ambulatory Visit: Payer: Medicare HMO | Admitting: Cardiovascular Disease

## 2020-06-28 ENCOUNTER — Other Ambulatory Visit: Payer: Self-pay

## 2020-06-28 VITALS — BP 118/72 | HR 75 | Ht 62.0 in | Wt 139.8 lb

## 2020-06-28 DIAGNOSIS — I25119 Atherosclerotic heart disease of native coronary artery with unspecified angina pectoris: Secondary | ICD-10-CM | POA: Diagnosis not present

## 2020-06-28 DIAGNOSIS — I4819 Other persistent atrial fibrillation: Secondary | ICD-10-CM | POA: Diagnosis not present

## 2020-06-28 LAB — LIPID PANEL
Chol/HDL Ratio: 1.8 ratio (ref 0.0–4.4)
Cholesterol, Total: 123 mg/dL (ref 100–199)
HDL: 67 mg/dL (ref >39)
LDL Chol Calc (NIH): 41 mg/dL (ref 0–99)
Triglycerides: 78 mg/dL (ref 0–149)
VLDL Cholesterol Cal: 15 mg/dL (ref 5–40)

## 2020-06-28 LAB — ALT: ALT: 17 IU/L (ref 0–32)

## 2020-06-28 LAB — CBC
Hematocrit: 37.8 % (ref 34.0–46.6)
Hemoglobin: 12.3 g/dL (ref 11.1–15.9)
MCH: 31.6 pg (ref 26.6–33.0)
MCHC: 32.5 g/dL (ref 31.5–35.7)
MCV: 97 fL (ref 79–97)
Platelets: 238 10*3/uL (ref 150–450)
RBC: 3.89 x10E6/uL (ref 3.77–5.28)
RDW: 12.5 % (ref 11.7–15.4)
WBC: 6.9 10*3/uL (ref 3.4–10.8)

## 2020-06-28 LAB — BASIC METABOLIC PANEL
BUN/Creatinine Ratio: 45 — ABNORMAL HIGH (ref 12–28)
BUN: 30 mg/dL — ABNORMAL HIGH (ref 8–27)
CO2: 19 mmol/L — ABNORMAL LOW (ref 20–29)
Calcium: 9 mg/dL (ref 8.7–10.3)
Chloride: 105 mmol/L (ref 96–106)
Creatinine, Ser: 0.66 mg/dL (ref 0.57–1.00)
Glucose: 94 mg/dL (ref 65–99)
Potassium: 4.4 mmol/L (ref 3.5–5.2)
Sodium: 137 mmol/L (ref 134–144)
eGFR: 85 mL/min/{1.73_m2} (ref >59)

## 2020-06-28 NOTE — Patient Instructions (Addendum)
Medication Instructions:  Your physician recommends that you continue on your current medications as directed. Please refer to the Current Medication list given to you today.  *If you need a refill on your cardiac medications before your next appointment, please call your pharmacy*   Lab Work: TODAY: Lipids, ALT, BMET, CBC If you have labs (blood work) drawn today and your tests are completely normal, you will receive your results only by: [facility] MyChart Message (if you have MyChart) OR . A paper copy in the mail If you have any lab test that is abnormal or we need to change your treatment, we will call you to review the results.   Testing/Procedures: none   Follow-Up: At [facility], you and your health needs are our priority.  As part of our continuing mission to provide you with exceptional heart care, we have created designated Provider Care Teams.  These Care Teams include your primary Cardiologist (physician) and Advanced Practice Providers (APPs -  Physician Assistants and Nurse Practitioners) who all work together to provide you with the care you need, when you need it.   Your next appointment:   6 month(s)  The format for your next appointment:   In Person  Provider:   You may see Mertie Moores, MD or one of the following Advanced Practice Providers on your designated Care Team:    Richardson Dopp, PA-C  [place], [place]

## 2020-06-29 ENCOUNTER — Telehealth: Payer: Self-pay | Admitting: *Deleted

## 2020-06-29 DIAGNOSIS — Z780 Asymptomatic menopausal state: Secondary | ICD-10-CM | POA: Diagnosis not present

## 2020-06-29 DIAGNOSIS — E7800 Pure hypercholesterolemia, unspecified: Secondary | ICD-10-CM | POA: Diagnosis not present

## 2020-06-29 DIAGNOSIS — E039 Hypothyroidism, unspecified: Secondary | ICD-10-CM | POA: Diagnosis not present

## 2020-06-29 DIAGNOSIS — I700 Atherosclerosis of aorta: Secondary | ICD-10-CM | POA: Diagnosis not present

## 2020-06-29 DIAGNOSIS — R0981 Nasal congestion: Secondary | ICD-10-CM | POA: Diagnosis not present

## 2020-06-29 DIAGNOSIS — I10 Essential (primary) hypertension: Secondary | ICD-10-CM | POA: Diagnosis not present

## 2020-06-29 DIAGNOSIS — I480 Paroxysmal atrial fibrillation: Secondary | ICD-10-CM | POA: Diagnosis not present

## 2020-06-29 DIAGNOSIS — Z79899 Other long term (current) drug therapy: Secondary | ICD-10-CM

## 2020-06-29 DIAGNOSIS — L821 Other seborrheic keratosis: Secondary | ICD-10-CM | POA: Diagnosis not present

## 2020-06-29 DIAGNOSIS — D6869 Other thrombophilia: Secondary | ICD-10-CM | POA: Diagnosis not present

## 2020-06-29 DIAGNOSIS — R7989 Other specified abnormal findings of blood chemistry: Secondary | ICD-10-CM

## 2020-06-29 DIAGNOSIS — M8580 Other specified disorders of bone density and structure, unspecified site: Secondary | ICD-10-CM | POA: Diagnosis not present

## 2020-06-29 NOTE — Telephone Encounter (Addendum)
The patient has been notified of the result and verbalized understanding.  All questions (if any) were answered. Darrell Jewel, RN 06/29/2020 12:28 PM   Orders and lab appointment made. Agreed to drink more water.  No blood in stool noted

## 2020-06-29 NOTE — Telephone Encounter (Signed)
-----   Message from Thayer Headings, MD sent at 06/28/2020  9:54 PM EDT ----- BUN is mildly elevated  Please make sure she is not having any blood in her stool ( or dark tarry stool)  Please make sure she is hydrated - drinking plenty of water Please recheck BMP and CBC in 1 month

## 2020-07-06 DIAGNOSIS — I10 Essential (primary) hypertension: Secondary | ICD-10-CM | POA: Diagnosis not present

## 2020-07-06 DIAGNOSIS — M8580 Other specified disorders of bone density and structure, unspecified site: Secondary | ICD-10-CM | POA: Diagnosis not present

## 2020-07-06 DIAGNOSIS — I480 Paroxysmal atrial fibrillation: Secondary | ICD-10-CM | POA: Diagnosis not present

## 2020-07-06 DIAGNOSIS — I25119 Atherosclerotic heart disease of native coronary artery with unspecified angina pectoris: Secondary | ICD-10-CM | POA: Diagnosis not present

## 2020-07-06 DIAGNOSIS — E7800 Pure hypercholesterolemia, unspecified: Secondary | ICD-10-CM | POA: Diagnosis not present

## 2020-07-06 DIAGNOSIS — E039 Hypothyroidism, unspecified: Secondary | ICD-10-CM | POA: Diagnosis not present

## 2020-07-08 DIAGNOSIS — Z1231 Encounter for screening mammogram for malignant neoplasm of breast: Secondary | ICD-10-CM | POA: Diagnosis not present

## 2020-07-08 DIAGNOSIS — M8589 Other specified disorders of bone density and structure, multiple sites: Secondary | ICD-10-CM | POA: Diagnosis not present

## 2020-07-08 DIAGNOSIS — Z803 Family history of malignant neoplasm of breast: Secondary | ICD-10-CM | POA: Diagnosis not present

## 2020-07-21 DIAGNOSIS — Z780 Asymptomatic menopausal state: Secondary | ICD-10-CM | POA: Diagnosis not present

## 2020-07-21 DIAGNOSIS — I10 Essential (primary) hypertension: Secondary | ICD-10-CM | POA: Diagnosis not present

## 2020-07-21 DIAGNOSIS — M8588 Other specified disorders of bone density and structure, other site: Secondary | ICD-10-CM | POA: Diagnosis not present

## 2020-07-29 ENCOUNTER — Other Ambulatory Visit: Payer: Self-pay

## 2020-07-29 ENCOUNTER — Other Ambulatory Visit: Payer: Medicare HMO

## 2020-07-29 DIAGNOSIS — Z79899 Other long term (current) drug therapy: Secondary | ICD-10-CM | POA: Diagnosis not present

## 2020-07-29 DIAGNOSIS — R7989 Other specified abnormal findings of blood chemistry: Secondary | ICD-10-CM

## 2020-07-29 LAB — CBC
Hematocrit: 38.4 % (ref 34.0–46.6)
Hemoglobin: 12.4 g/dL (ref 11.1–15.9)
MCH: 31.6 pg (ref 26.6–33.0)
MCHC: 32.3 g/dL (ref 31.5–35.7)
MCV: 98 fL — ABNORMAL HIGH (ref 79–97)
Platelets: 230 10*3/uL (ref 150–450)
RBC: 3.92 x10E6/uL (ref 3.77–5.28)
RDW: 13 % (ref 11.7–15.4)
WBC: 9.8 10*3/uL (ref 3.4–10.8)

## 2020-07-29 LAB — BASIC METABOLIC PANEL
BUN/Creatinine Ratio: 22 (ref 12–28)
BUN: 14 mg/dL (ref 8–27)
CO2: 19 mmol/L — ABNORMAL LOW (ref 20–29)
Calcium: 9.2 mg/dL (ref 8.7–10.3)
Chloride: 103 mmol/L (ref 96–106)
Creatinine, Ser: 0.63 mg/dL (ref 0.57–1.00)
Glucose: 89 mg/dL (ref 65–99)
Potassium: 4.6 mmol/L (ref 3.5–5.2)
Sodium: 137 mmol/L (ref 134–144)
eGFR: 86 mL/min/{1.73_m2} (ref >59)

## 2020-08-30 DIAGNOSIS — I480 Paroxysmal atrial fibrillation: Secondary | ICD-10-CM | POA: Diagnosis not present

## 2020-08-30 DIAGNOSIS — I272 Pulmonary hypertension, unspecified: Secondary | ICD-10-CM | POA: Diagnosis not present

## 2020-08-30 DIAGNOSIS — I25119 Atherosclerotic heart disease of native coronary artery with unspecified angina pectoris: Secondary | ICD-10-CM | POA: Diagnosis not present

## 2020-08-30 DIAGNOSIS — I10 Essential (primary) hypertension: Secondary | ICD-10-CM | POA: Diagnosis not present

## 2020-08-30 DIAGNOSIS — E7800 Pure hypercholesterolemia, unspecified: Secondary | ICD-10-CM | POA: Diagnosis not present

## 2020-08-30 DIAGNOSIS — E039 Hypothyroidism, unspecified: Secondary | ICD-10-CM | POA: Diagnosis not present

## 2020-08-30 DIAGNOSIS — M8580 Other specified disorders of bone density and structure, unspecified site: Secondary | ICD-10-CM | POA: Diagnosis not present

## 2020-08-31 DIAGNOSIS — I480 Paroxysmal atrial fibrillation: Secondary | ICD-10-CM | POA: Diagnosis not present

## 2020-08-31 DIAGNOSIS — N3281 Overactive bladder: Secondary | ICD-10-CM | POA: Diagnosis not present

## 2020-08-31 DIAGNOSIS — D6869 Other thrombophilia: Secondary | ICD-10-CM | POA: Diagnosis not present

## 2020-08-31 DIAGNOSIS — I10 Essential (primary) hypertension: Secondary | ICD-10-CM | POA: Diagnosis not present

## 2020-08-31 DIAGNOSIS — I272 Pulmonary hypertension, unspecified: Secondary | ICD-10-CM | POA: Diagnosis not present

## 2020-09-29 ENCOUNTER — Other Ambulatory Visit: Payer: Self-pay | Admitting: Cardiovascular Disease

## 2020-11-02 DIAGNOSIS — Z1389 Encounter for screening for other disorder: Secondary | ICD-10-CM | POA: Diagnosis not present

## 2020-11-02 DIAGNOSIS — M8580 Other specified disorders of bone density and structure, unspecified site: Secondary | ICD-10-CM | POA: Diagnosis not present

## 2020-11-02 DIAGNOSIS — I700 Atherosclerosis of aorta: Secondary | ICD-10-CM | POA: Diagnosis not present

## 2020-11-02 DIAGNOSIS — Z0000 Encounter for general adult medical examination without abnormal findings: Secondary | ICD-10-CM | POA: Diagnosis not present

## 2020-11-02 DIAGNOSIS — E039 Hypothyroidism, unspecified: Secondary | ICD-10-CM | POA: Diagnosis not present

## 2020-11-02 DIAGNOSIS — I10 Essential (primary) hypertension: Secondary | ICD-10-CM | POA: Diagnosis not present

## 2020-11-02 DIAGNOSIS — I272 Pulmonary hypertension, unspecified: Secondary | ICD-10-CM | POA: Diagnosis not present

## 2020-11-02 DIAGNOSIS — Z79899 Other long term (current) drug therapy: Secondary | ICD-10-CM | POA: Diagnosis not present

## 2020-11-02 DIAGNOSIS — I25119 Atherosclerotic heart disease of native coronary artery with unspecified angina pectoris: Secondary | ICD-10-CM | POA: Diagnosis not present

## 2020-11-02 DIAGNOSIS — R350 Frequency of micturition: Secondary | ICD-10-CM | POA: Diagnosis not present

## 2020-11-02 DIAGNOSIS — E7800 Pure hypercholesterolemia, unspecified: Secondary | ICD-10-CM | POA: Diagnosis not present

## 2020-11-02 DIAGNOSIS — E559 Vitamin D deficiency, unspecified: Secondary | ICD-10-CM | POA: Diagnosis not present

## 2020-11-02 DIAGNOSIS — I480 Paroxysmal atrial fibrillation: Secondary | ICD-10-CM | POA: Diagnosis not present

## 2020-12-04 ENCOUNTER — Other Ambulatory Visit: Payer: Self-pay | Admitting: Cardiovascular Disease

## 2020-12-15 DIAGNOSIS — H35371 Puckering of macula, right eye: Secondary | ICD-10-CM | POA: Diagnosis not present

## 2020-12-15 DIAGNOSIS — H52201 Unspecified astigmatism, right eye: Secondary | ICD-10-CM | POA: Diagnosis not present

## 2020-12-15 DIAGNOSIS — H524 Presbyopia: Secondary | ICD-10-CM | POA: Diagnosis not present

## 2020-12-15 DIAGNOSIS — H04123 Dry eye syndrome of bilateral lacrimal glands: Secondary | ICD-10-CM | POA: Diagnosis not present

## 2020-12-26 ENCOUNTER — Encounter: Payer: Self-pay | Admitting: Cardiovascular Disease

## 2020-12-26 NOTE — Progress Notes (Signed)
Cardiology Office Note   Date:  12/27/2020   ID:  Rachel Park, DOB 01-31-1932, MRN [id number]  PCP:  Lajean Manes, MD  Cardiologist:   Mertie Moores, MD   No chief complaint on file.   problem list 1. Coronary artery  disease-status post stenting of her proximal LAD 2.  CVA- occurred during the stenting 3.  Atrial Fibrillation    Previous notes  Rachel Park is a 87 y.o. female who presents for follow up of her stenting She has done well.  Is a bit anxious - may account for her elevated BP today .  Memory has improved ,  No residual CVA symptoms.   Feb. 1, 2017: Doing well Has some fatigue. Has some bruising on lower logs  -  On Plavix   June 21, 2015:  Rachel Park was diagnosed with atrial fib, was started on eliquis 5 mg BID.  She was in [place] with her sister. Developed abdominal pain .   Severe hypoxemia .   In great distress. Was found to have a small bowell perforation .   Was in the ICU for 2 days.   Developed hypotension , was started on levophed,   Developed atrial fib  plavix was stopped,   Heparin was started.   Has converted to NSR at this time  Has had some band like chest pain since that time Has shortness of breath - at rest and with exertion .     September 22, 2015:  Rachel Park is doing well.  We DC'd her Eliquis - due to bleeding.   She is on Plavix  Her only episode of a-fib was in the setting of a small bowel obstruction   Is very fatigued.   Was put on Iron tablet by Dr. Felipa Eth. Hb is still low  [place] also increased her Coreg to 25 BID .  BP readings at home have been well controlled.  Has not had any syncope Goes for a walk every day and is fatigued when she is done with the walk.   No CP .  Has occasional chest tightness .  Same symptoms back in April - [name] was normal in April, 2017   Feb. 16, 2018:  Rachel Park is seen today . Has had surgery to repair 4 hernias.   Cant get her energy  Its only been a month - I reminded  her that it takes time to heal after surgery .  No CP or dyspnea.   Sept. 24, 2018:  Rachel Park is seen today  Had some CP in July Repeat cath shows no obstructive CAD ,  LAD stent was widely patent  Has had some bursitis of left hip and knee.  No CP .   Thought to have reflux.   Tried Zantac, caused diarrhea, did not continue the zantac.   She has tried Crestor 10 mg in the past. But we had to decrease the dose to 5 g because of muscle aches. She thinks that she might be eating little bit more than she should.   May 23, 2017:  Doing well.  Has been working with the pharmacist at Dr. Sherryll Burger office  They are concerned about her HR in the 87s .  Has not had any syncope.   Has profound lack of energy - several hours after taking the coreg   Sept. 17, 2019: Rachel Park is seen for follow up of her CAD and PAF HR is typically low. Has not had any further  episodes of presyncope since we reduced the dose of Coreg. Still eats a bit more salt than she should   September 24, 2018  Rachel Park was admitted with ischemia bowel on Jul 23, 2018.   She had a prolonged ICU stay and developed AF with RVR.   She had a prolonged hospitalization with slow recovery.  She was started on Eliquis at the time of discharge.   She diuresed well   She was seen by Cecilie Kicks, NP a month ago .  Seemed to be doing well,  Was started back on amiodarone. Her torsemide has been changed to PRN  Breathing is better.  Eating well.   Abdomen is better.   Aug. 19, 2020   Rachel Park is seen today . She was seen a month ago and was set up for a cardioversion.  She had been started on amiodarone and was in NSR when she showed up for her cardioverison She reported some tachycardia and generalized weakness  recently. She was thought to have developed recurrent AF and her amiodarone was increased to 200 mg BID .   Sept. 10, 2020  Rachel Park is seen for follow up of her atrial flutter. I saw her several weeks ago and found her to be  in atrial flutter.   We doubled her amiodarone and had her return today for follow up  Nov. 11 , 2020  Rachel Park is seen today for follow up of atrial flutter  Ankles have been swollen for the past month She had run out of her lasix that she had received from the hospital  She takes lasix 20 mg as needed - has not needed it for the past 3 weeks.   Jan. 12, 2021  Rachel Park is seen today for follow up of her CAD and atrial fib She has persistent AF.   We stopped her amiodarone several months ago and have gone with a rate control and anticoagulation strategy Has had more fatigue recently .    June 16, 2019:  Rachel Park is seen today for follow-up visit.  She has atrial fibrillation.  We have adopted a rate control and anticoagulation strategy.  Blood pressure and heart rate look good. Doesn't have much energy.  No real muscle weakness.  She walks every am for 40 minutes  Brought her HR and BP log.   Overall her HR and BP have been well controled.   August 11, 2019:  Rachel Park is seen today for a work in visit because of persistent rapid atrial fibrillation.  Her blood pressure is also  Low.  We had stopped her metoprolol and put her on diltiazem due to fatigue.  She has been on diltiazem but her heart rate has been not as well controlled..  Her fatigue is no different even off the metoprolol  She has no signs or symptoms of any underlying infection.  She is eating and drinking well.  Oct. 13, 2021: Rachel Park is seen today for follow up of her atrial fib, CAD She has continued to be fatigued She has been tried on tikosyn ( March 2020), failed to stay in rhythm.  Tried amiodarone.  She is more short of breath recently .  Has lots of fatigue  She has seen Dr. Curt Bears .  They discussed possible AV node ablation and pacer placement  Is on lasix for ankle edema  June 28, 2020: Rachel Park is seen today for follow up of her atrial fib Has chronic fatigue  Is not sleeping well at night.  Wakes up 3-5 times at  night  Her memory is not as good as she would like   Oct. 24, 2022 Rachel Park is seen today for follow up of her atrial fib, CAD She stopped her laisx on Sept. Her BP was too low and she was going to the bathroom all the time  Her recent BP look good .  She has chronic dyspena Her dyspnea has not worsened .  We discussed taking the lasix  Still taking the spironolactone 25 mg a day  Has been started on Fosamax.    Past Medical History:  Diagnosis Date   Arthritis    Complication of anesthesia    Coronary artery disease    Hypertension    Hypothyroidism    PAF (paroxysmal atrial fibrillation) ([place])    on Xarelto for maybe a year, opted to stop   PONV (postoperative nausea and vomiting)    Renal infarction ([place])    Small bowel obstruction ([place]) 06/2015   Stroke ([place])    oct 2016   TIA (transient ischemic attack) 03/24/2015   Vertigo, benign positional    to be evaluated, intermittent    Past Surgical History:  Procedure Laterality Date   ABDOMINAL HYSTERECTOMY     APPLICATION OF WOUND VAC N/A 07/23/2018   Procedure: APPLICATION OF WOUND VAC;  Surgeon: Rolm Bookbinder, MD;  Location: [facility];  Service: General;  Laterality: N/A;   BOWEL RESECTION N/A 07/23/2018   Procedure: SMALL BOWEL RESECTION;  Surgeon: Rolm Bookbinder, MD;  Location: [facility];  Service: General;  Laterality: N/A;   CARDIAC CATHETERIZATION N/A 12/24/2014   Procedure: Left Heart Cath and Coronary Angiography;  Surgeon: Sherren Mocha, MD;  Location: [facility];  Service: Cardiovascular;  Laterality: N/A;   CARDIOVERSION N/A 05/22/2018   Procedure: CARDIOVERSION;  Surgeon: Josue Hector, MD;  Location: [facility] ENDOSCOPY;  Service: Cardiovascular;  Laterality: N/A;   CATARACT EXTRACTION Bilateral    COLONOSCOPY WITH PROPOFOL N/A 12/01/2013   Procedure: COLONOSCOPY WITH PROPOFOL;  Surgeon: Garlan Fair, MD;  Location: [facility] ENDOSCOPY;  Service: Endoscopy;  Laterality: N/A;   COMPLEX WOUND CLOSURE  07/25/2018    Procedure: Complex Wound Closure;  Surgeon: Rolm Bookbinder, MD;  Location: [facility];  Service: General;;   Pennie Rushing  07/25/2018   Procedure: Ileocecetomy;  Surgeon: Rolm Bookbinder, MD;  Location: [facility];  Service: General;;   [place] N/A 03/10/2016   Procedure: INCISIONAL HERNIA REPAIR TIMES TWO;  Surgeon: Armandina Gemma, MD;  Location: [facility];  Service: General;  Laterality: N/A;   INGUINAL HERNIA REPAIR Bilateral 03/10/2016   Procedure: BILATERAL INGUINAL HERNIA REPAIRS;  Surgeon: Armandina Gemma, MD;  Location: [facility];  Service: General;  Laterality: Bilateral;   INSERTION OF MESH N/A 03/10/2016   Procedure: INSERTION OF MESH TO BILATERAL GROINS AND ABDOMEN;  Surgeon: Armandina Gemma, MD;  Location: [facility];  Service: General;  Laterality: N/A;   laparotomy  06/2015   LAPAROTOMY N/A 07/23/2018   Procedure: EXPLORATORY LAPAROTOMY;  Surgeon: Rolm Bookbinder, MD;  Location: [facility];  Service: General;  Laterality: N/A;   LEFT HEART CATH AND CORONARY ANGIOGRAPHY N/A 09/27/2016   Procedure: Left Heart Cath and Coronary Angiography;  Surgeon: Sherren Mocha, MD;  Location: [facility];  Service: Cardiovascular;  Laterality: N/A;   TONSILLECTOMY       Current Outpatient Medications  Medication Sig Dispense Refill   acetaminophen (TYLENOL) 500 MG tablet Take 1,000 mg by mouth every 6 (six) hours as  needed for mild pain.      alendronate (FOSAMAX) 70 MG tablet Take 1 tablet by mouth once a week.     apixaban (ELIQUIS) 2.5 MG TABS tablet Take 1 tablet (2.5 mg total) by mouth 2 (two) times daily. 60 tablet 5   Calcium Carb-Cholecalciferol (CALCIUM + D3) 600-800 MG-UNIT TABS Take 1 tablet by mouth daily.     Cholecalciferol (VITAMIN D3) 50 MCG (2000 UT) TABS Take 2,000 Units by mouth daily with lunch.     diltiazem (CARDIZEM CD) 300 MG 24 hr capsule Take 1 capsule (300 mg total) by mouth daily. 30 capsule 2   levothyroxine (SYNTHROID) 88 MCG tablet Take 88 mcg by mouth every morning.      metoprolol succinate (TOPROL-XL) 100 MG 24 hr tablet TAKE 1 TABLET BY MOUTH TWICE DAILY WITH A MEAL OR IMMEDIATELY FOLLOWING A MEAL 60 tablet 6   NITROSTAT 0.4 MG SL tablet Place 1 tablet (0.4 mg total) under the tongue every 5 (five) minutes as needed. Chest pain 25 tablet 6   Polyethyl Glycol-Propyl Glycol 0.4-0.3 % SOLN Place 1-2 drops into both eyes every other day.     rosuvastatin (CRESTOR) 10 MG tablet Take 1 tablet by mouth once daily 90 tablet 3   spironolactone (ALDACTONE) 25 MG tablet Take 1 tablet by mouth once daily 90 tablet 1   No current facility-administered medications for this visit.    Allergies:   Propofol and Other    Social History:  The patient  reports that she has never smoked. She has never used smokeless tobacco. She reports that she does not currently use alcohol. She reports that she does not use drugs.   Family History:  The patient's family history includes Cancer in her sister; Heart attack (age of onset: 62) in her father; Stroke in her mother.    ROS:      Physical Exam: Blood pressure 118/72, pulse 78, height 5\' 2"  (1.575 m), weight 149 lb 3.2 oz (67.7 kg), SpO2 97 %.  GEN:  Well nourished, well developed in no acute distress HEENT: Normal NECK: No JVD; No carotid bruits LYMPHATICS: No lymphadenopathy CARDIAC: Irreg.  Irreg .  RESPIRATORY:  Clear to auscultation without rales, wheezing or rhonchi  ABDOMEN: Soft, non-tender, non-distended MUSCULOSKELETAL:  No edema; No deformity  SKIN: Warm and dry NEUROLOGIC:  Alert and oriented x 3  EKG:    December 27, 2020: Atrial fibrillation with a heart rate of 78.  No ST or T wave changes.   Recent Labs: 06/28/2020: ALT 17 07/29/2020: BUN 14; Creatinine, Ser 0.63; Hemoglobin 12.4; Platelets 230; Potassium 4.6; Sodium 137    Lipid Panel    Component Value Date/Time   CHOL 123 06/28/2020 0924   TRIG 78 06/28/2020 0924   HDL 67 06/28/2020 0924   CHOLHDL 1.8 06/28/2020 0924   CHOLHDL 1.9 08/22/2019  0552   VLDL 11 08/22/2019 0552   LDLCALC 41 06/28/2020 0924      Wt Readings from Last 3 Encounters:  12/27/20 149 lb 3.2 oz (67.7 kg)  06/28/20 139 lb 12.8 oz (63.4 kg)  03/11/20 133 lb 9.6 oz (60.6 kg)      Other studies Reviewed: Additional studies/ records that were reviewed today include: . Review of the above records demonstrates:    ASSESSMENT AND PLAN:  1.  CAD -       no angina ,  lipid levels look good   2. Persistent  atrial fib/ atrial flutter  - CHADS2VASC  score of 4-6  ( female, age > 55, vascular disease,   TIA and a CVA ( as a complication to her cath)    She is tolerating the a-fib well  HR is well controlled.    3.  Pulmonary Hypertension:   stable     3. Hyperlipidemia:   We will check lipids, ALT in 6 months.   5.  Hypertension::   Her blood pressure is actually very stable now that she stopped her Lasix.  She stopped her Lasix because her blood pressure was running low when she was running to the bathroom all the time.  6.  Chronic diastolic congestive heart failure: Fluid management seems to be fairly well controlled on the spironolactone.  She does not appear to need Lasix at this point.  We will continue to watch her closely.  Current medicines are reviewed at length with the patient today.  The patient does not have concerns regarding medicines.  The following changes have been made:  no change  Labs/ tests ordered today include:   Orders Placed This Encounter  Procedures   Lipid Profile   ALT   EKG 12-Lead      Disposition:     Mertie Moores, MD  12/27/2020 8:45 AM    [facility] [address], [place], [place]  [address] Phone: [phone]; Fax: [phone]

## 2020-12-27 ENCOUNTER — Encounter: Payer: Self-pay | Admitting: Cardiovascular Disease

## 2020-12-27 ENCOUNTER — Other Ambulatory Visit: Payer: Self-pay

## 2020-12-27 ENCOUNTER — Ambulatory Visit: Payer: Medicare HMO | Admitting: Cardiovascular Disease

## 2020-12-27 VITALS — BP 118/72 | HR 78 | Ht 62.0 in | Wt 149.2 lb

## 2020-12-27 DIAGNOSIS — E782 Mixed hyperlipidemia: Secondary | ICD-10-CM

## 2020-12-27 DIAGNOSIS — I5022 Chronic systolic (congestive) heart failure: Secondary | ICD-10-CM | POA: Diagnosis not present

## 2020-12-27 DIAGNOSIS — R7989 Other specified abnormal findings of blood chemistry: Secondary | ICD-10-CM

## 2020-12-27 DIAGNOSIS — I4821 Permanent atrial fibrillation: Secondary | ICD-10-CM | POA: Diagnosis not present

## 2020-12-27 DIAGNOSIS — Z79899 Other long term (current) drug therapy: Secondary | ICD-10-CM

## 2020-12-27 DIAGNOSIS — I25119 Atherosclerotic heart disease of native coronary artery with unspecified angina pectoris: Secondary | ICD-10-CM | POA: Diagnosis not present

## 2020-12-27 MED ORDER — METOPROLOL SUCCINATE ER 100 MG PO TB24: 100.0000 mg | ORAL_TABLET | Freq: Two times a day (BID) | ORAL | 3 refills | Status: AC

## 2020-12-27 NOTE — Patient Instructions (Addendum)
Medication Instructions:  Stop lasix *If you need a refill on your cardiac medications before your next appointment, please call your pharmacy*   LAB:  Lipid and ALT in 6 months  If you have labs (blood work) drawn today and your tests are completely normal, you will receive your results only by: [place] (if you have MyChart) OR A paper copy in the mail If you have any lab test that is abnormal or we need to change your treatment, we will call you to review the results.   Testing/Procedures: none   Follow-Up: At [facility], you and your health needs are our priority.  As part of our continuing mission to provide you with exceptional heart care, we have created designated Provider Care Teams.  These Care Teams include your primary Cardiologist (physician) and Advanced Practice Providers (APPs -  Physician Assistants and Nurse Practitioners) who all work together to provide you with the care you need, when you need it.  We recommend signing up for the patient portal called "MyChart".  Sign up information is provided on this After Visit Summary.  MyChart is used to connect with patients for Virtual Visits (Telemedicine).  Patients are able to view lab/test results, encounter notes, upcoming appointments, etc.  Non-urgent messages can be sent to your provider as well.   To learn more about what you can do with MyChart, go to [email or web address].    Your next appointment:   6 month(s) with fasting Lipid and ALT prior   The format for your next appointment:   In Person  Provider:   You will see one of the following Advanced Practice Providers on your designated Care Team:   Richardson Dopp, PA-C [place], [place]

## 2021-01-15 ENCOUNTER — Other Ambulatory Visit: Payer: Self-pay | Admitting: Cardiovascular Disease

## 2021-03-14 DIAGNOSIS — I272 Pulmonary hypertension, unspecified: Secondary | ICD-10-CM | POA: Diagnosis not present

## 2021-03-14 DIAGNOSIS — E7800 Pure hypercholesterolemia, unspecified: Secondary | ICD-10-CM | POA: Diagnosis not present

## 2021-03-14 DIAGNOSIS — I25119 Atherosclerotic heart disease of native coronary artery with unspecified angina pectoris: Secondary | ICD-10-CM | POA: Diagnosis not present

## 2021-03-14 DIAGNOSIS — E039 Hypothyroidism, unspecified: Secondary | ICD-10-CM | POA: Diagnosis not present

## 2021-03-14 DIAGNOSIS — I480 Paroxysmal atrial fibrillation: Secondary | ICD-10-CM | POA: Diagnosis not present

## 2021-03-14 DIAGNOSIS — I10 Essential (primary) hypertension: Secondary | ICD-10-CM | POA: Diagnosis not present

## 2021-03-14 DIAGNOSIS — M8580 Other specified disorders of bone density and structure, unspecified site: Secondary | ICD-10-CM | POA: Diagnosis not present

## 2021-03-19 IMAGING — DX PORTABLE CHEST - 1 VIEW
1 series · 1 of 1 positions shown · non-contrast
Comparison: 07/26/2018

CLINICAL DATA: Acute respiratory failure

EXAM:
PORTABLE CHEST 1 VIEW

[chest]
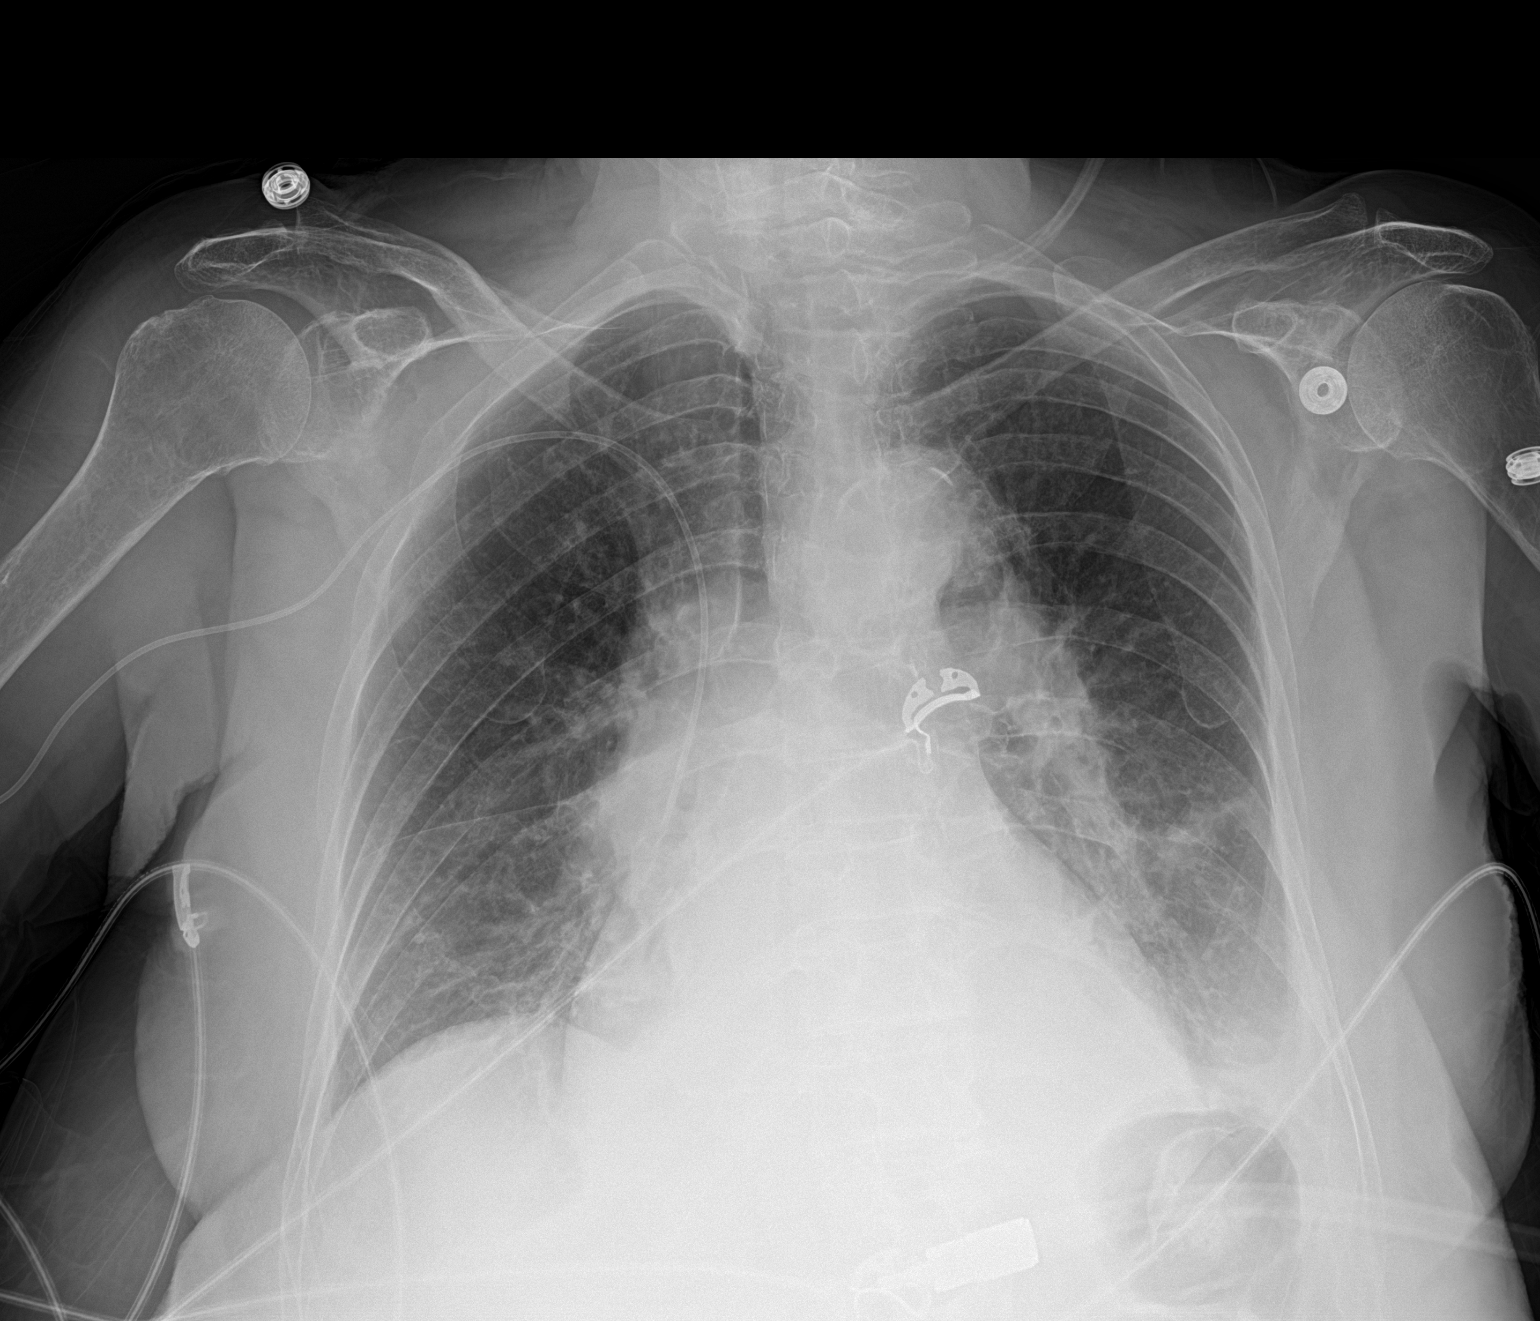

[1 of 1 positions shown; findings below may reference images not displayed]

FINDINGS: Endotracheal tube and NG tube removed. Right arm PICC tip SVC
unchanged.

Right lower lobe airspace disease unchanged. Progression of left
lower lobe airspace disease and small left effusion. Negative for
edema.
IMPRESSION: Endotracheal tube removed. Progression of left lower lobe airspace
disease and small left effusion.

## 2021-03-23 IMAGING — DX PORTABLE ABDOMEN - 1 VIEW
1 series · 2 of 2 positions shown · non-contrast
Comparison: Abdominal x-ray dated July 25, 2018.

CLINICAL DATA: Ileus. Recent closed loop small bowel obstruction
with ischemia requiring small bowel resection and ileocecectomy.

EXAM:
PORTABLE ABDOMEN - 1 VIEW

[Series 1: abdomen · 0.14mm/px · 2 of 2 slices shown]
[im 1/2]
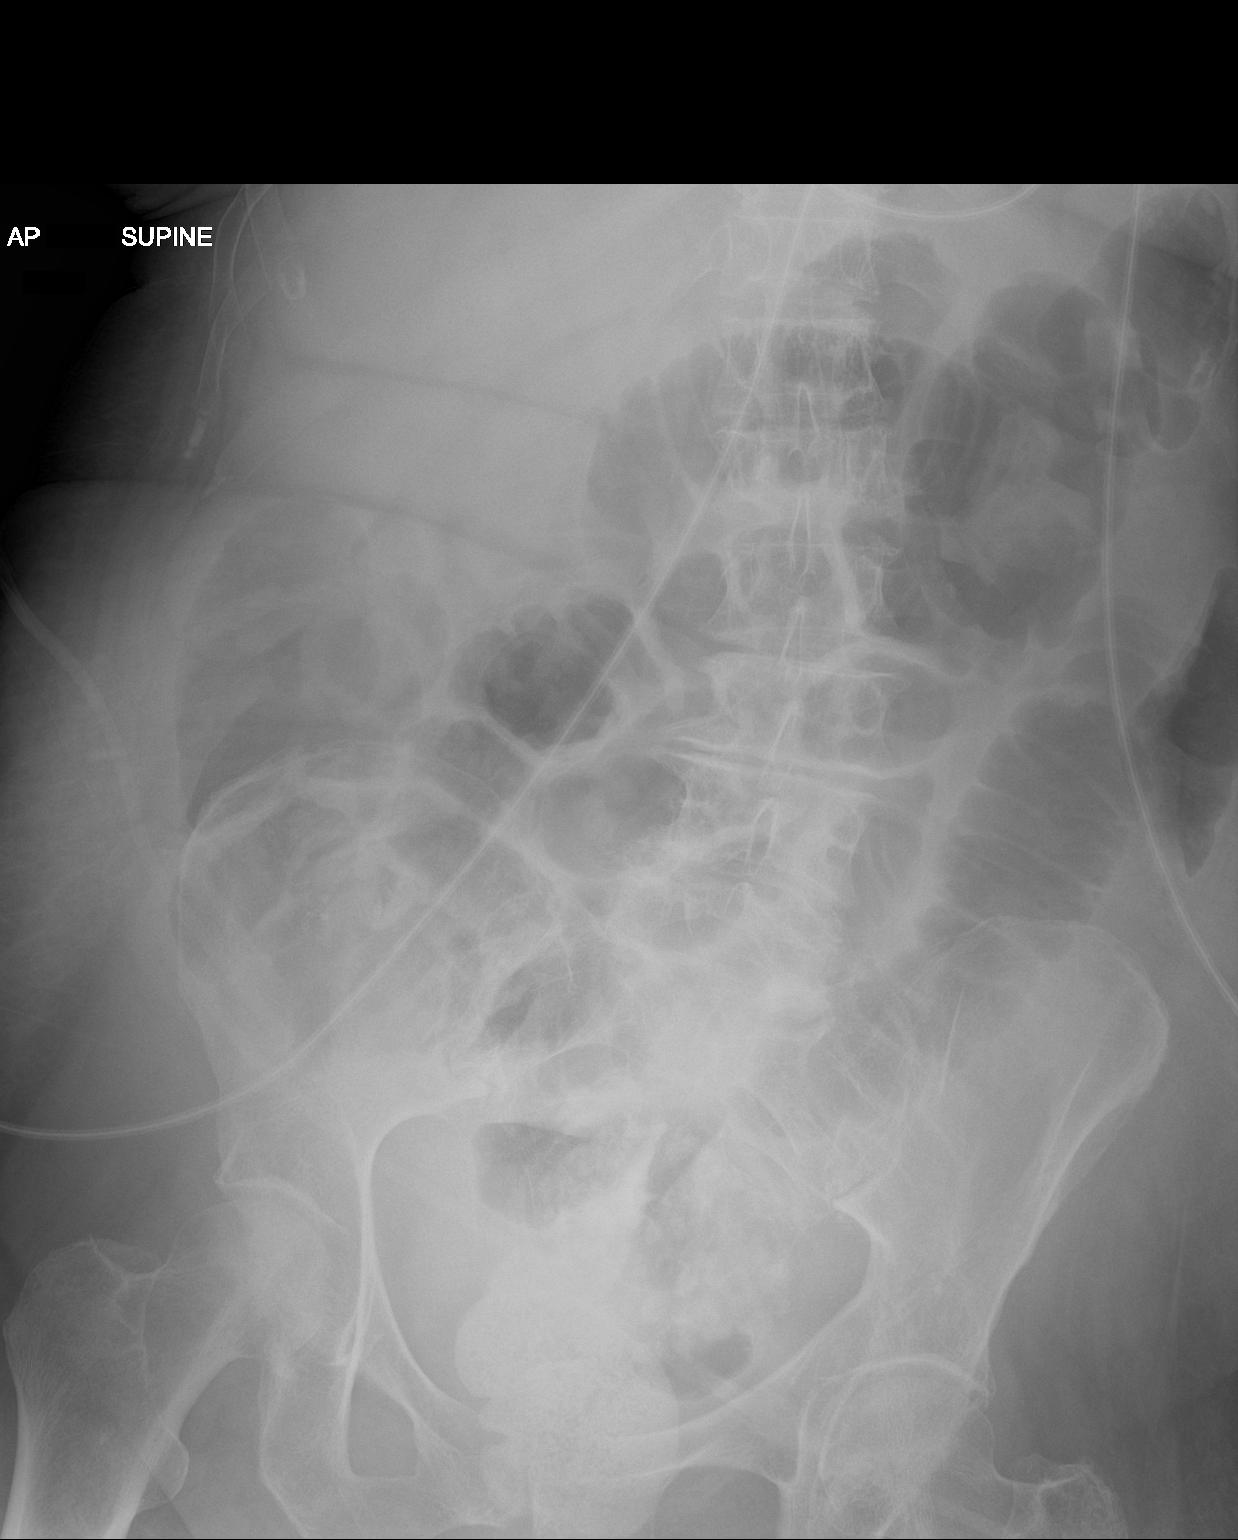
[im 2/2]
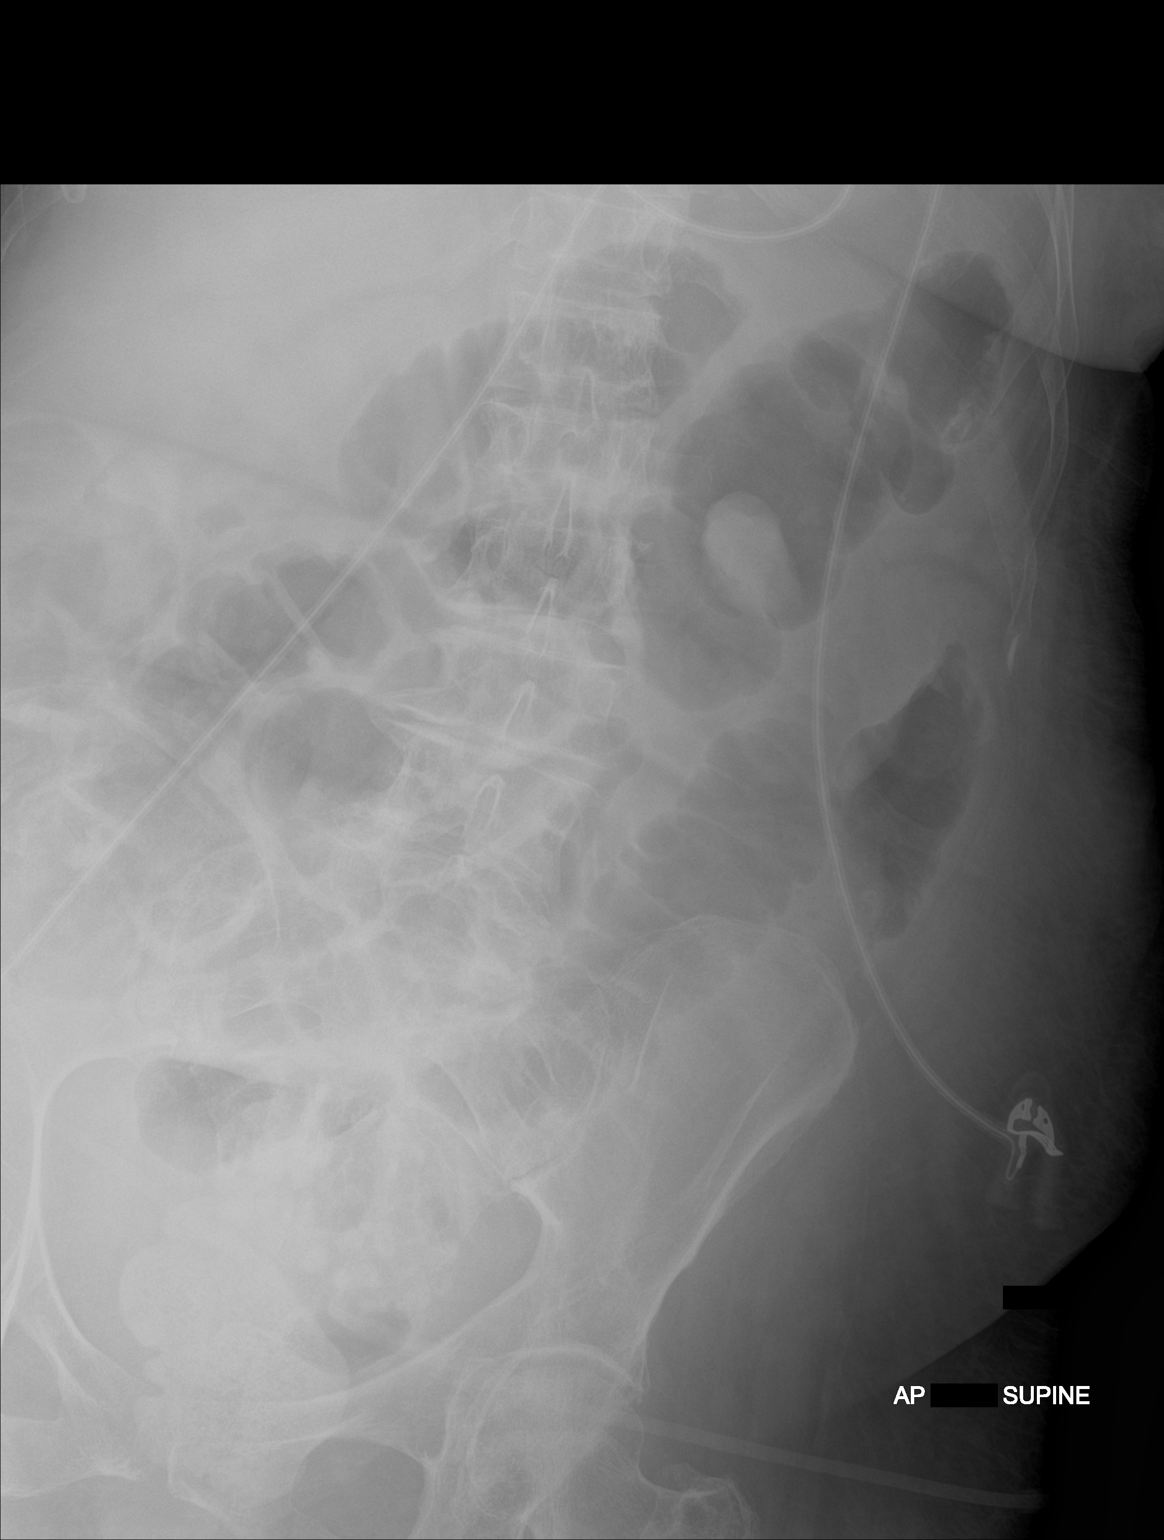

[2 of 2 positions shown; findings below may reference images not displayed]

FINDINGS: There are multiple new mildly dilated loops of air-filled small
bowel in the abdomen measuring up to 4.5 cm in diameter. A small
amount of stool is seen in the rectum. No acute osseous abnormality.
IMPRESSION: Findings consistent with ileus.

## 2021-03-24 IMAGING — CT CT ABDOMEN AND PELVIS WITH CONTRAST
2 of 5 series · 15 of 46 positions shown, 17 images · IV contrast (APPLIED)
Comparison: Abdominopelvic CT 07/22/2018 and 04/21/2018.

CLINICAL DATA: Nausea and vomiting status post small-bowel
resection for ischemic bowel.

EXAM:
CT ABDOMEN AND PELVIS WITH CONTRAST
TECHNIQUE: Multidetector CT imaging of the abdomen and pelvis was performed
using the standard protocol following bolus administration of
intravenous contrast.
CONTRAST:  100mL OMNIPAQUE IOHEXOL 300 MG/ML  SOLN

[Series 3: abdomen 5.0 · axial · 0.67mm/px · z∈[+881,+1266]mm · 12 of 91 slices shown, 14 images]
[im 7/91  soft-tissue]
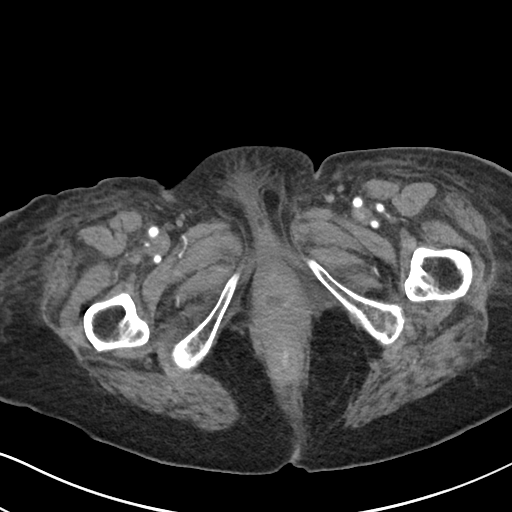
[im 7/91  bone]
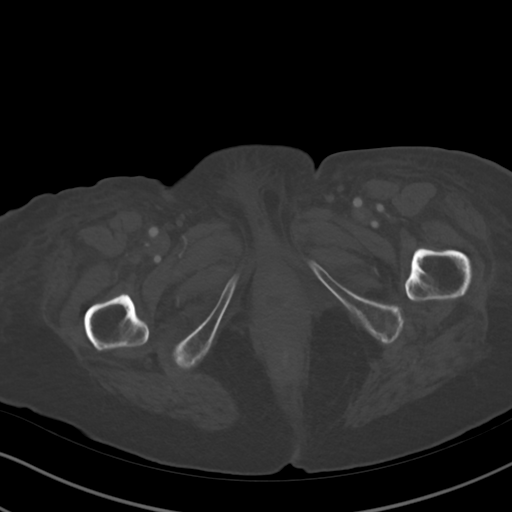
[im 13/91  soft-tissue]
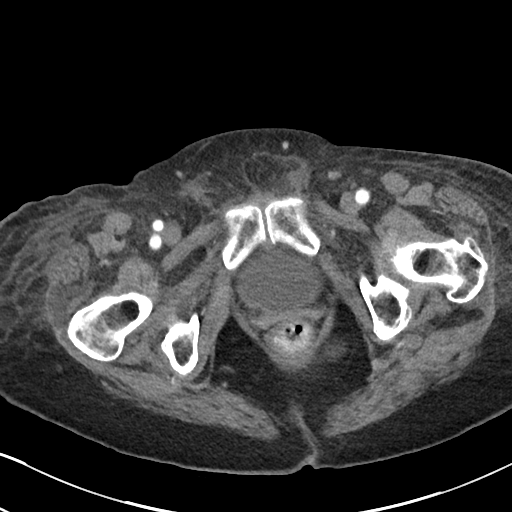
[im 20/91  soft-tissue]
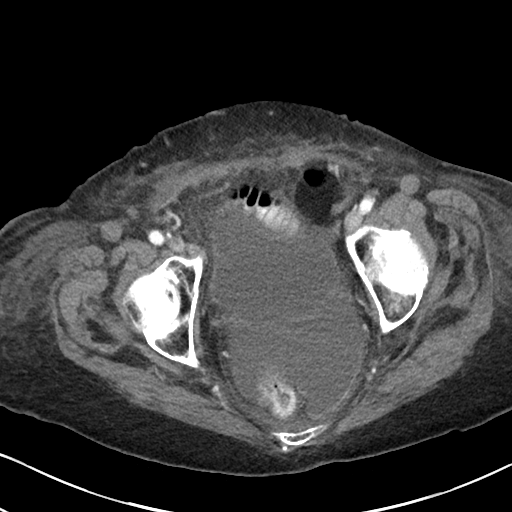
[im 26/91  soft-tissue]
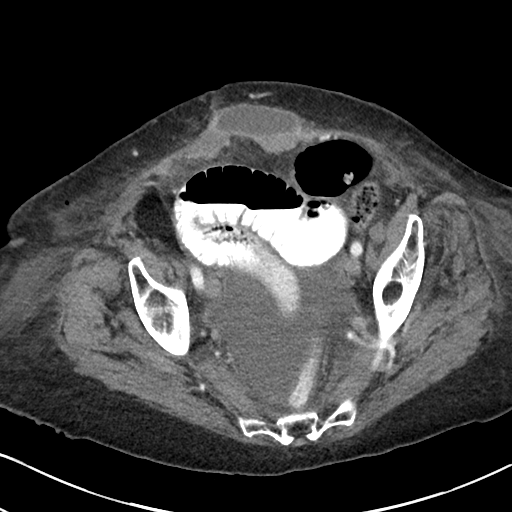
[im 33/91  soft-tissue]
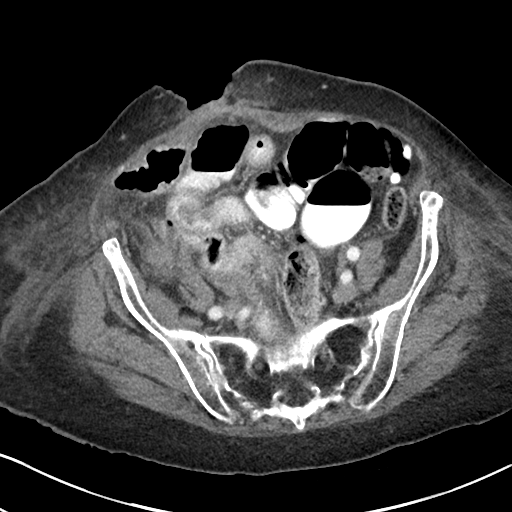
[im 39/91  soft-tissue]
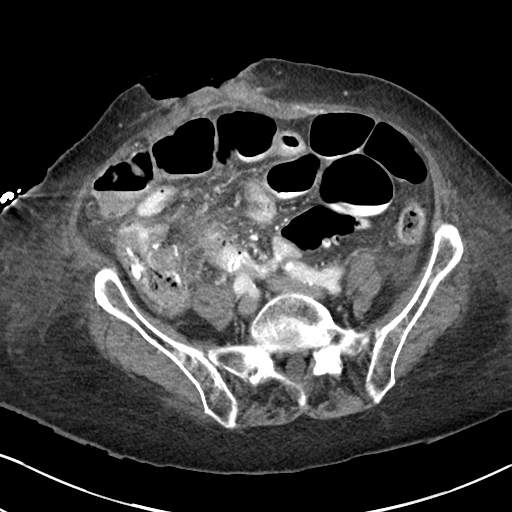
[im 52/91  soft-tissue]
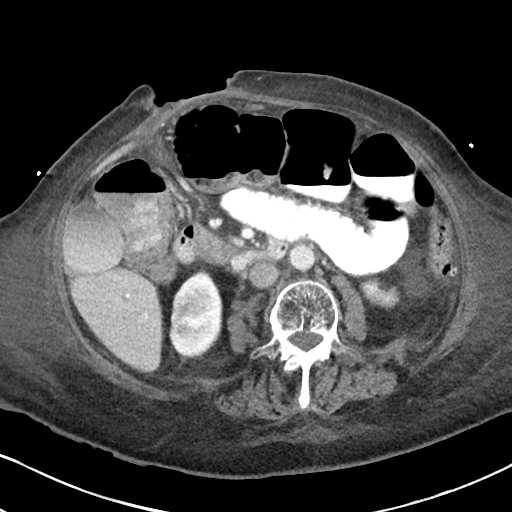
[im 58/91  soft-tissue]
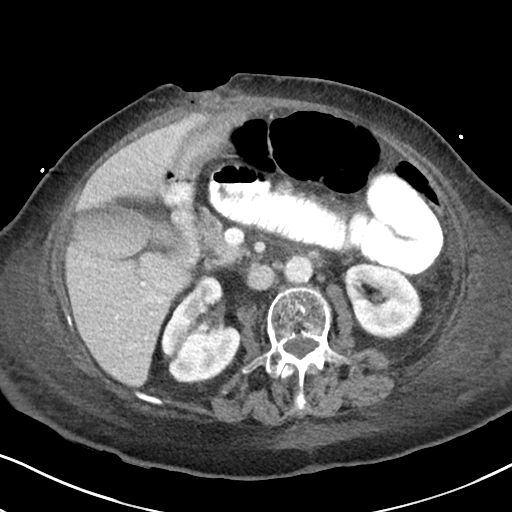
[im 65/91  soft-tissue]
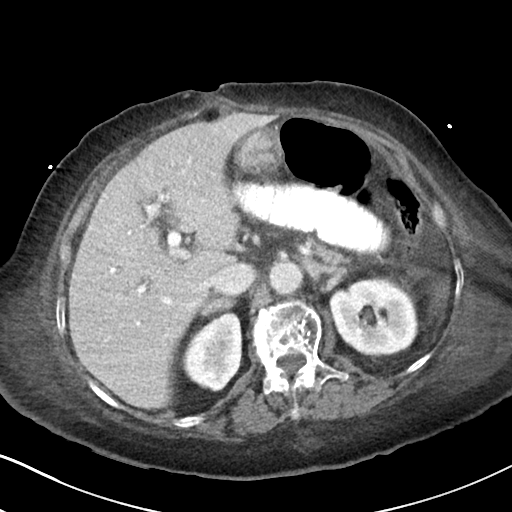
[im 65/91  bone]
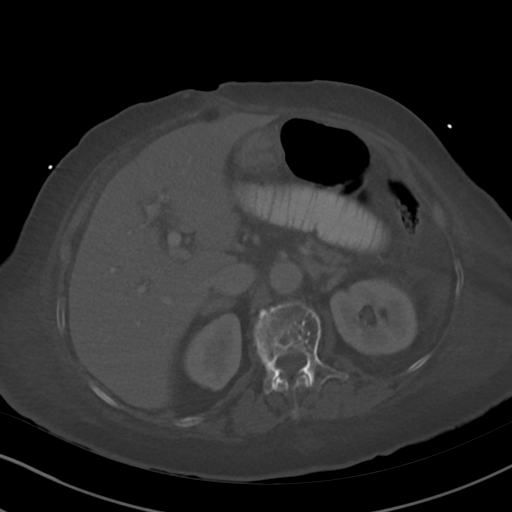
[im 71/91  soft-tissue]
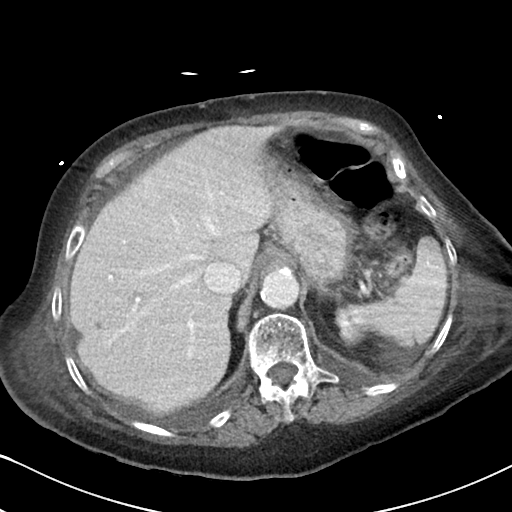
[im 78/91  soft-tissue]
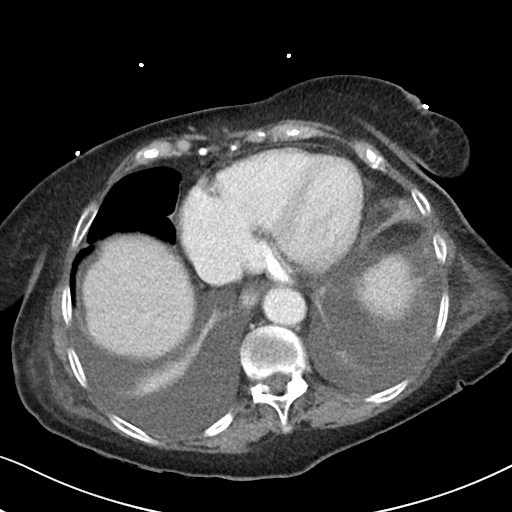
[im 84/91  soft-tissue]
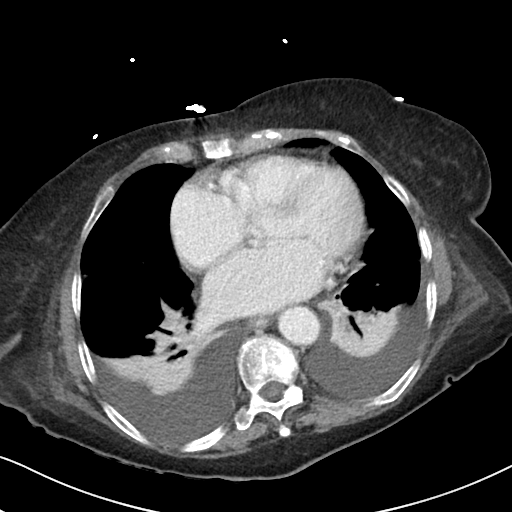

[Series 6: abdomen 3.0 mpr cor · coronal · 0.82mm/px · 3 of 101 slices shown]
[im 34/101  soft-tissue]
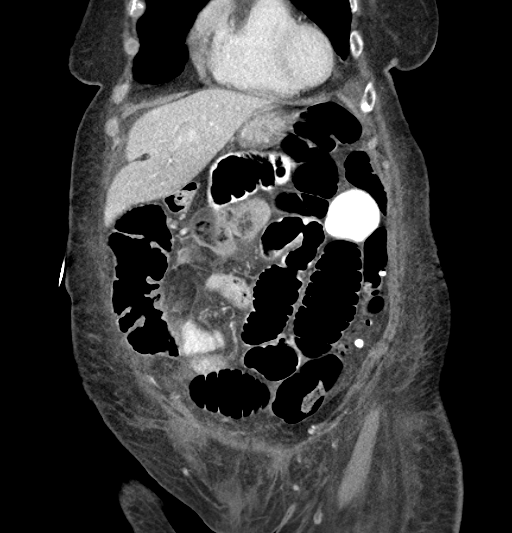
[im 45/101  soft-tissue]
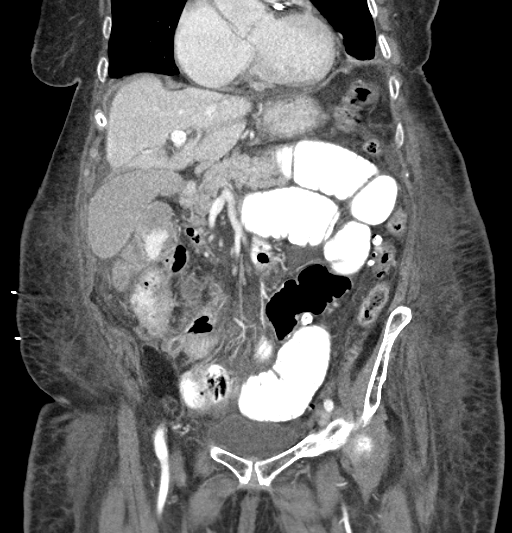
[im 56/101  soft-tissue]
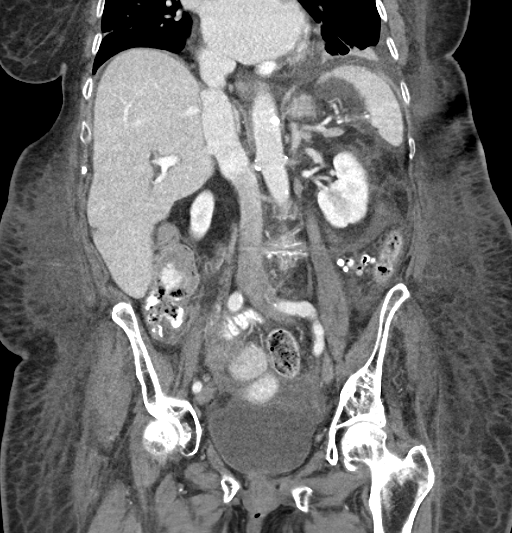

[15 of 46 positions shown; findings below may reference images not displayed]

FINDINGS: Lower chest: There are new moderate size dependent pleural effusions
bilaterally with associated compressive atelectasis at both lung
bases. There is no pericardial effusion. The heart is mildly
enlarged, and there is aortic and coronary artery atherosclerosis.

Hepatobiliary: The liver is normal in density without suspicious
focal abnormality. High-density within the gallbladder lumen is
likely due to vicarious excretion of contrast. There is no
gallbladder wall thickening or significant biliary dilatation.

Pancreas: Unremarkable. No pancreatic ductal dilatation or
surrounding inflammatory changes.

Spleen: Normal in size without focal abnormality.

Adrenals/Urinary Tract: The adrenal glands appear stable without
suspicious findings. There is no evidence of urinary tract calculus
or hydronephrosis. Bilateral renal cortical scarring is similar to
the prior study. No significant bladder findings other than probable
pelvic floor laxity.

Stomach/Bowel: Interval abdominal surgery with distal small bowel
resection and ileocolonic anastomosis. The stomach and proximal
small bowel are decompressed, although there is mild dilatation of
the distal small bowel. The ileocolonic anastomosis is patent, best
seen on coronal images 45 through 50 of series 6. There is a small
extraluminal fluid collection adjacent to the anastomosis measuring
2.6 x 1.6 cm on image 57/3. The colon is decompressed. There is no
significant bowel wall thickening. There are mild diverticular
changes in the sigmoid colon. There is a probable sigmoid colon
anastomosis.

Vascular/Lymphatic: There are no enlarged abdominal or pelvic lymph
nodes. There is aortic and branch vessel atherosclerosis without
evidence of large vessel occlusion. Atherosclerosis is present at
the origins the celiac trunk and SMA. The mesenteric and portal
veins are patent.

Reproductive: Hysterectomy. No adnexal mass. Probable pelvic floor
laxity.

Other: As above, interval postsurgical changes with an open midline
abdominal wall incision. Along the inferior margin of the incision,
there is a subcutaneous fluid collection measuring 5.8 x 2.3 x
cm. The abdominal fascia appears intact. Within the mesenteric fat,
there is soft tissue irregularity on image 49/3 which may reflect a
small focus of fat necrosis. There is an additional small fluid
collection in the mesenteric fat measuring 1.9 cm on image 44/3. A
moderate amount of pelvic ascites is present without suspicious
peripheral enhancement. A small amount perihepatic ascites is
present. There is no free air or extravasated enteric contrast.
There is mild edema throughout the subcutaneous fat consistent with
anasarca.

Musculoskeletal: No acute or significant osseous findings. Stable
mild superior endplate compression deformity at L1.
IMPRESSION: 1. Interval partial small bowel resection and ileocolonic
anastomosis. There is dilatation of the distal small bowel with a
patent anastomosis, most likely secondary to a postoperative ileus.
2. No bowel wall thickening or other signs of recurrent bowel
ischemia. No extravasated enteric contrast.
3. Small intra-abdominal fluid collections adjacent to the
ileocolonic anastomosis and in the small bowel mesentery. No
drainable collection. There is a large collection within the
anterior abdominal wall along the inferior margin of the open
incision.
4. Anasarca with generalized soft tissue edema, moderate bilateral
pleural effusions and predominately pelvic ascites. Associated
bibasilar atelectasis.

## 2021-05-06 DIAGNOSIS — I10 Essential (primary) hypertension: Secondary | ICD-10-CM | POA: Diagnosis not present

## 2021-05-06 DIAGNOSIS — I480 Paroxysmal atrial fibrillation: Secondary | ICD-10-CM | POA: Diagnosis not present

## 2021-05-06 DIAGNOSIS — I25118 Atherosclerotic heart disease of native coronary artery with other forms of angina pectoris: Secondary | ICD-10-CM | POA: Diagnosis not present

## 2021-05-06 DIAGNOSIS — Z79899 Other long term (current) drug therapy: Secondary | ICD-10-CM | POA: Diagnosis not present

## 2021-05-06 DIAGNOSIS — D6869 Other thrombophilia: Secondary | ICD-10-CM | POA: Diagnosis not present

## 2021-05-06 DIAGNOSIS — I700 Atherosclerosis of aorta: Secondary | ICD-10-CM | POA: Diagnosis not present

## 2021-06-06 ENCOUNTER — Encounter (INDEPENDENT_AMBULATORY_CARE_PROVIDER_SITE_OTHER): Payer: Medicare HMO | Admitting: Ophthalmology

## 2021-06-06 DIAGNOSIS — I10 Essential (primary) hypertension: Secondary | ICD-10-CM

## 2021-06-06 DIAGNOSIS — H35341 Macular cyst, hole, or pseudohole, right eye: Secondary | ICD-10-CM

## 2021-06-06 DIAGNOSIS — H43813 Vitreous degeneration, bilateral: Secondary | ICD-10-CM

## 2021-06-06 DIAGNOSIS — H35033 Hypertensive retinopathy, bilateral: Secondary | ICD-10-CM

## 2021-06-13 ENCOUNTER — Encounter (INDEPENDENT_AMBULATORY_CARE_PROVIDER_SITE_OTHER): Payer: Medicare HMO | Admitting: Ophthalmology

## 2021-06-14 ENCOUNTER — Other Ambulatory Visit: Payer: Self-pay | Admitting: Cardiovascular Disease

## 2021-06-24 ENCOUNTER — Other Ambulatory Visit: Payer: Medicare HMO | Admitting: *Deleted

## 2021-06-24 DIAGNOSIS — E782 Mixed hyperlipidemia: Secondary | ICD-10-CM | POA: Diagnosis not present

## 2021-06-24 DIAGNOSIS — R7989 Other specified abnormal findings of blood chemistry: Secondary | ICD-10-CM | POA: Diagnosis not present

## 2021-06-24 DIAGNOSIS — I25119 Atherosclerotic heart disease of native coronary artery with unspecified angina pectoris: Secondary | ICD-10-CM

## 2021-06-24 LAB — ALT: ALT: 18 IU/L (ref 0–32)

## 2021-06-26 NOTE — Progress Notes (Signed)
?Cardiology Office Note:   ? ?Date:  06/27/2021  ? ?ID:  Rachel Park, DOB [birth year], MRN [id number] ? ?PCP:  Lajean Manes, MD  ?[facility] Cardiologist:  Mertie Moores, MD  ?[place] Electrophysiologist:  Will Meredith Leeds, MD  ? ?Chief Complaint: 6 months follow up  ? ?History of Present Illness:   ? ?Rachel Park is a 88 y.o. female with a hx of CAD s/p stenting to proximal LAD in 2016, CVA during stenting, persistent atrial fibrillation/flutter, chronic diastolic heart failure, hypertension, hyperlipidemia, pulmonary hypertension seen for follow-up. ? ?Cath 09/2016 ?Single-vessel coronary artery disease with patency of the proximal LAD stent and mild nonobstructive stenosis in the mid vessel ?2. Widely patent left main, left circumflex, and RCA with no significant obstructive lesions. ?3. Normal LV function with estimated LVEF 55-65% ? ?Echocardiogram 08/2019 with LV function of 45 to 50% (EF was 55-60% 04/2018), global hypokinesis, RVSP 38 mmHg, severely dilated left atrium. ? ?The patient lives by herself in [place].  Walks about 15 to 20 minutes without chest pain.  She has a chronic fatigue which she attributes to her age.  Denies chest pain, shortness of breath, orthopnea, PND, syncope, lower extremity edema or melena.  Reviewed recent blood work done by PCP. ? ?Past Medical History:  ?Diagnosis Date  ? Arthritis   ? Complication of anesthesia   ? Coronary artery disease   ? Hypertension   ? Hypothyroidism   ? PAF (paroxysmal atrial fibrillation) ([place])   ? on Xarelto for maybe a year, opted to stop  ? PONV (postoperative nausea and vomiting)   ? Renal infarction [facility])   ? Small bowel obstruction ([place]) 06/2015  ? Stroke [facility])   ? oct 2016  ? TIA (transient ischemic attack) 03/24/2015  ? Vertigo, benign positional   ? to be evaluated, intermittent  ? ? ?Past Surgical History:  ?Procedure Laterality Date  ? ABDOMINAL HYSTERECTOMY    ? APPLICATION OF WOUND VAC N/A 07/23/2018  ? Procedure: APPLICATION  OF WOUND VAC;  Surgeon: Rolm Bookbinder, MD;  Location: [place];  Service: General;  Laterality: N/A;  ? BOWEL RESECTION N/A 07/23/2018  ? Procedure: SMALL BOWEL RESECTION;  Surgeon: Rolm Bookbinder, MD;  Location: [place];  Service: General;  Laterality: N/A;  ? CARDIAC CATHETERIZATION N/A 12/24/2014  ? Procedure: Left Heart Cath and Coronary Angiography;  Surgeon: Sherren Mocha, MD;  Location: [place] CV LAB;  Service: Cardiovascular;  Laterality: N/A;  ? CARDIOVERSION N/A 05/22/2018  ? Procedure: CARDIOVERSION;  Surgeon: Josue Hector, MD;  Location: [facility] ENDOSCOPY;  Service: Cardiovascular;  Laterality: N/A;  ? CATARACT EXTRACTION Bilateral   ? COLONOSCOPY WITH PROPOFOL N/A 12/01/2013  ? Procedure: COLONOSCOPY WITH PROPOFOL;  Surgeon: Garlan Fair, MD;  [facility]: [facility] ENDOSCOPY;  Service: Endoscopy;  Laterality: N/A;  ? COMPLEX WOUND CLOSURE  07/25/2018  ? Procedure: Complex Wound Closure;  Surgeon: Rolm Bookbinder, MD;  Location: [place];  Service: General;;  ? ILEOCECETOMY  07/25/2018  ? Procedure: Ileocecetomy;  Surgeon: Rolm Bookbinder, MD;  Location: [place];  Service: General;;  ? [place] N/A 03/10/2016  ? Procedure: INCISIONAL HERNIA REPAIR TIMES TWO;  Surgeon: Armandina Gemma, MD;  Location: [place];  Service: General;  Laterality: N/A;  ? INGUINAL HERNIA REPAIR Bilateral 03/10/2016  ? Procedure: BILATERAL INGUINAL HERNIA REPAIRS;  Surgeon: Armandina Gemma, MD;  Location: [place];  Service: General;  Laterality: Bilateral;  ? INSERTION OF MESH N/A 03/10/2016  ? Procedure: INSERTION OF MESH  TO BILATERAL GROINS AND ABDOMEN;  Surgeon: Armandina Gemma, MD;  Location: [place];  Service: General;  Laterality: N/A;  ? laparotomy  06/2015  ? LAPAROTOMY N/A 07/23/2018  ? Procedure: EXPLORATORY LAPAROTOMY;  Surgeon: Rolm Bookbinder, MD;  Location: [place];  Service: General;  Laterality: N/A;  ? LEFT HEART CATH AND CORONARY ANGIOGRAPHY N/A 09/27/2016  ? Procedure: Left Heart Cath and Coronary Angiography;  Surgeon:  Sherren Mocha, MD;  Location: [place] CV LAB;  Service: Cardiovascular;  Laterality: N/A;  ? TONSILLECTOMY    ? ? ?Current Medications: ?Current Meds  ?Medication Sig  ? acetaminophen (TYLENOL) 500 MG tablet Take 1,000 mg by mouth every 6 (six) hours as needed for mild pain.   ? alendronate (FOSAMAX) 70 MG tablet Take 1 tablet by mouth once a week.  ? apixaban (ELIQUIS) 2.5 MG TABS tablet Take 1 tablet (2.5 mg total) by mouth 2 (two) times daily.  ? Calcium Carb-Cholecalciferol (CALCIUM + D3) 600-800 MG-UNIT TABS Take 1 tablet by mouth daily.  ? Cholecalciferol (VITAMIN D3) 50 MCG (2000 UT) TABS Take 2,000 Units by mouth daily with lunch.  ? diltiazem (CARDIZEM CD) 300 MG 24 hr capsule Take 1 capsule (300 mg total) by mouth daily.  ? famotidine (PEPCID) 10 MG tablet 1 tablet as needed  ? levothyroxine (SYNTHROID) 88 MCG tablet Take 88 mcg by mouth every morning.  ? metoprolol succinate (TOPROL-XL) 100 MG 24 hr tablet Take 1 tablet (100 mg total) by mouth 2 (two) times daily. Take with or immediately following a meal.  ? NITROSTAT 0.4 MG SL tablet Place 1 tablet (0.4 mg total) under the tongue every 5 (five) minutes as needed. Chest pain  ? Polyethyl Glycol-Propyl Glycol 0.4-0.3 % SOLN Place 1-2 drops into both eyes every other day.  ? rosuvastatin (CRESTOR) 10 MG tablet Take 1 tablet by mouth once daily  ? spironolactone (ALDACTONE) 25 MG tablet Take 1 tablet by mouth once daily  ?  ? ?Allergies:   Propofol and Other  ? ?Social History  ? ?Socioeconomic History  ? Marital status: Divorced  ?  Spouse name: Not on file  ? Number of children: 2  ? Years of education: HS  ? Highest education level: Not on file  ?Occupational History  ? Occupation: retired  ?Tobacco Use  ? Smoking status: Never  ? Smokeless tobacco: Never  ?Vaping Use  ? Vaping Use: Never used  ?Substance and Sexual Activity  ? Alcohol use: Not Currently  ?  Comment: occ.  ? Drug use: No  ? Sexual activity: Not on file  ?Other Topics Concern  ? Not  on file  ?Social History Narrative  ? Lives alone.  Widow.  Two children  ? ?Social Determinants of Health  ? ?Financial Resource Strain: Not on file  ?Food Insecurity: Not on file  ?Transportation Needs: Not on file  ?Physical Activity: Not on file  ?Stress: Not on file  ?Social Connections: Not on file  ?  ? ?Family History: ?The patient's family history includes Cancer in her sister; Heart attack (age of onset: 51) in her father; Stroke in her mother.   ? ?ROS:   ?Please see the history of present illness.    ?All other systems reviewed and are negative.  ? ?EKGs/Labs/Other Studies Reviewed:   ? ?The following studies were reviewed today: ? ?Echo 08/2019 ? 1. Left ventricular ejection fraction, by estimation, is 45 to 50%. The  ?left ventricle has mildly decreased function. The left ventricle  ?  demonstrates global hypokinesis. Left ventricular diastolic function could  ?not be evaluated.  ? 2. Right ventricular systolic function is mildly reduced. The right  ?ventricular size is normal. There is mildly elevated pulmonary artery  ?systolic pressure. The estimated right ventricular systolic pressure is  ?21.3 mmHg.  ? 3. Left atrial size was severely dilated.  ? 4. Right atrial size was moderately dilated.  ? 5. The mitral valve is myxomatous. Moderate mitral valve regurgitation.  ? 6. The aortic valve is normal in structure. Aortic valve regurgitation is  ?trivial. Mild aortic valve sclerosis is present, with no evidence of  ?aortic valve stenosis.  ? 7. Aortic dilatation noted. There is borderline dilatation of the  ?ascending aorta measuring 38 mm.  ? 8. The inferior vena cava is dilated in size with <50% respiratory  ?variability, suggesting right atrial pressure of 15 mmHg.  ? ?Comparison(s): Prior images reviewed side by side. The left ventricular  ?function is worsened.  ? ?EKG:  EKG is not ordered today.  ? ?Recent Labs: ?07/29/2020: BUN 14; Creatinine, Ser 0.63; Hemoglobin 12.4; Platelets 230; Potassium 4.6;  Sodium 137 ?06/24/2021: ALT 18  ?Recent Lipid Panel ?   ?Component Value Date/Time  ? CHOL 123 06/28/2020 0924  ? TRIG 78 06/28/2020 0924  ? HDL 67 06/28/2020 0924  ? CHOLHDL 1.8 06/28/2020 0924  ?

## 2021-06-27 ENCOUNTER — Encounter: Payer: Self-pay | Admitting: Physician Assistant

## 2021-06-27 ENCOUNTER — Ambulatory Visit: Payer: Medicare HMO | Admitting: Physician Assistant

## 2021-06-27 VITALS — BP 130/70 | HR 82 | Ht 61.0 in | Wt 152.0 lb

## 2021-06-27 DIAGNOSIS — I5022 Chronic systolic (congestive) heart failure: Secondary | ICD-10-CM

## 2021-06-27 DIAGNOSIS — I4821 Permanent atrial fibrillation: Secondary | ICD-10-CM

## 2021-06-27 DIAGNOSIS — I25119 Atherosclerotic heart disease of native coronary artery with unspecified angina pectoris: Secondary | ICD-10-CM | POA: Diagnosis not present

## 2021-06-27 DIAGNOSIS — E782 Mixed hyperlipidemia: Secondary | ICD-10-CM | POA: Diagnosis not present

## 2021-06-27 NOTE — Patient Instructions (Signed)
Medication Instructions:  ?Your physician recommends that you continue on your current medications as directed. Please refer to the Current Medication list given to you today.  ?*If you need a refill on your cardiac medications before your next appointment, please call your pharmacy* ? ? ?Lab Work: ?None ?If you have labs (blood work) drawn today and your tests are completely normal, you will receive your results only by: ?MyChart Message (if you have MyChart) OR ?A paper copy in the mail ?If you have any lab test that is abnormal or we need to change your treatment, we will call you to review the results. ? ?Follow-Up: ?At [facility], you and your health needs are our priority.  As part of our continuing mission to provide you with exceptional heart care, we have created designated Provider Care Teams.  These Care Teams include your primary Cardiologist (physician) and Advanced Practice Providers (APPs -  Physician Assistants and Nurse Practitioners) who all work together to provide you with the care you need, when you need it. ? ?Your next appointment:   ?1 year(s) ? ?The format for your next appointment:   ?In Person ? ?Provider:   ?Mertie Moores, MD { ? ?Important Information About Sugar ? ? ? ? ?  ?

## 2021-07-01 DIAGNOSIS — H35371 Puckering of macula, right eye: Secondary | ICD-10-CM | POA: Diagnosis not present

## 2021-07-01 DIAGNOSIS — H5000 Unspecified esotropia: Secondary | ICD-10-CM | POA: Diagnosis not present

## 2021-07-25 DIAGNOSIS — Z1231 Encounter for screening mammogram for malignant neoplasm of breast: Secondary | ICD-10-CM | POA: Diagnosis not present

## 2021-09-12 ENCOUNTER — Other Ambulatory Visit: Payer: Self-pay | Admitting: Cardiovascular Disease

## 2021-11-04 DIAGNOSIS — I272 Pulmonary hypertension, unspecified: Secondary | ICD-10-CM | POA: Diagnosis not present

## 2021-11-04 DIAGNOSIS — E039 Hypothyroidism, unspecified: Secondary | ICD-10-CM | POA: Diagnosis not present

## 2021-11-04 DIAGNOSIS — Z79899 Other long term (current) drug therapy: Secondary | ICD-10-CM | POA: Diagnosis not present

## 2021-11-04 DIAGNOSIS — I25119 Atherosclerotic heart disease of native coronary artery with unspecified angina pectoris: Secondary | ICD-10-CM | POA: Diagnosis not present

## 2021-11-04 DIAGNOSIS — Z0000 Encounter for general adult medical examination without abnormal findings: Secondary | ICD-10-CM | POA: Diagnosis not present

## 2021-11-04 DIAGNOSIS — I700 Atherosclerosis of aorta: Secondary | ICD-10-CM | POA: Diagnosis not present

## 2021-11-04 DIAGNOSIS — I10 Essential (primary) hypertension: Secondary | ICD-10-CM | POA: Diagnosis not present

## 2021-11-04 DIAGNOSIS — E7800 Pure hypercholesterolemia, unspecified: Secondary | ICD-10-CM | POA: Diagnosis not present

## 2021-12-16 DIAGNOSIS — Z961 Presence of intraocular lens: Secondary | ICD-10-CM | POA: Diagnosis not present

## 2021-12-16 DIAGNOSIS — H35371 Puckering of macula, right eye: Secondary | ICD-10-CM | POA: Diagnosis not present

## 2021-12-16 DIAGNOSIS — H524 Presbyopia: Secondary | ICD-10-CM | POA: Diagnosis not present

## 2021-12-17 DIAGNOSIS — Z23 Encounter for immunization: Secondary | ICD-10-CM | POA: Diagnosis not present

## 2021-12-24 ENCOUNTER — Other Ambulatory Visit: Payer: Self-pay | Admitting: Cardiovascular Disease

## 2022-01-01 ENCOUNTER — Encounter: Payer: Self-pay | Admitting: Cardiovascular Disease

## 2022-01-01 NOTE — Progress Notes (Unsigned)
Cardiology Office Note   Date:  01/02/2022   ID:  Rachel Park, DOB Jan 26, 1932, MRN [id number]  PCP:  Rachel Manes, MD  Cardiologist:   Rachel Moores, MD   Chief Complaint  Patient presents with   Coronary Artery Disease   Atrial Fibrillation     problem list 1. Coronary artery  disease-status post stenting of her proximal LAD 2.  CVA- occurred during the stenting 3.  Atrial Fibrillation    Previous notes  Rachel Park is a 88 y.o. female who presents for follow up of her stenting She has done well.  Is a bit anxious - may account for her elevated BP today .  Memory has improved ,  No residual CVA symptoms.   Feb. 1, 2017: Doing well Has some fatigue. Has some bruising on lower logs  -  On Plavix   June 21, 2015:  Rachel Park was diagnosed with atrial fib, was started on eliquis 5 mg BID.  She was in [place] with her sister. Developed abdominal pain .   Severe hypoxemia .   In great distress. Was found to have a small bowell perforation .   Was in the ICU for 2 days.   Developed hypotension , was started on levophed,   Developed atrial fib  plavix was stopped,   Heparin was started.   Has converted to NSR at this time  Has had some band like chest pain since that time Has shortness of breath - at rest and with exertion .     September 22, 2015:  Rachel Park is doing well.  We DC'd her Eliquis - due to bleeding.   She is on Plavix  Her only episode of a-fib was in the setting of a small bowel obstruction   Is very fatigued.   Was put on Iron tablet by Rachel Park. Hb is still low  Rachel Park also increased her Coreg to 25 BID .  BP readings at home have been well controlled.  Has not had any syncope Goes for a walk every day and is fatigued when she is done with the walk.   No CP .  Has occasional chest tightness .  Same symptoms back in April - Rachel Park was normal in April, 2017   Feb. 16, 2018:  Rachel Park is seen today . Has had surgery to repair 4  hernias.   Cant get her energy  Its only been a month - I reminded her that it takes time to heal after surgery .  No CP or dyspnea.   Sept. 24, 2018:  Rachel Park is seen today  Had some CP in July Repeat cath shows no obstructive CAD ,  LAD stent was widely patent  Has had some bursitis of left hip and knee.  No CP .   Thought to have reflux.   Tried Zantac, caused diarrhea, did not continue the zantac.   She has tried Crestor 10 mg in the past. But we had to decrease the dose to 5 g because of muscle aches. She thinks that she might be eating little bit more than she should.   May 23, 2017:  Doing well.  Has been working with the pharmacist at [facility] office  They are concerned about her HR in the 64s .  Has not had any syncope.   Has profound lack of energy - several hours after taking the coreg   Sept. 17, 2019: Rachel Park is seen for follow up of her  CAD and PAF HR is typically low. Has not had any further episodes of presyncope since we reduced the dose of Coreg. Still eats a bit more salt than she should   September 24, 2018  Rachel Park was admitted with ischemia bowel on Jul 23, 2018.   She had a prolonged ICU stay and developed AF with RVR.   She had a prolonged hospitalization with slow recovery.  She was started on Eliquis at the time of discharge.   She diuresed well   She was seen by Rachel Kicks, NP a month ago .  Seemed to be doing well,  Was started back on amiodarone. Her torsemide has been changed to PRN  Breathing is better.  Eating well.   Abdomen is better.   Aug. 19, 2020   Rachel Park is seen today . She was seen a month ago and was set up for a cardioversion.  She had been started on amiodarone and was in NSR when she showed up for her cardioverison She reported some tachycardia and generalized weakness  recently. She was thought to have developed recurrent AF and her amiodarone was increased to 200 mg BID .   Sept. 10, 2020  Rachel Park is seen for follow up of  her atrial flutter. I saw her several weeks ago and found her to be in atrial flutter.   We doubled her amiodarone and had her return today for follow up  Nov. 11 , 2020  Rachel Park is seen today for follow up of atrial flutter  Ankles have been swollen for the past month She had run out of her lasix that she had received from the hospital  She takes lasix 20 mg as needed - has not needed it for the past 3 weeks.   Jan. 12, 2021  Rachel Park is seen today for follow up of her CAD and atrial fib She has persistent AF.   We stopped her amiodarone several months ago and have gone with a rate control and anticoagulation strategy Has had more fatigue recently .    June 16, 2019:  Rachel Park is seen today for follow-up visit.  She has atrial fibrillation.  We have adopted a rate control and anticoagulation strategy.  Blood pressure and heart rate look good. Doesn't have much energy.  No real muscle weakness.  She walks every am for 40 minutes  Brought her HR and BP log.   Overall her HR and BP have been well controled.   August 11, 2019:  Rachel Park is seen today for a work in visit because of persistent rapid atrial fibrillation.  Her blood pressure is also  Low.  We had stopped her metoprolol and put her on diltiazem due to fatigue.  She has been on diltiazem but her heart rate has been not as well controlled..  Her fatigue is no different even off the metoprolol  She has no signs or symptoms of any underlying infection.  She is eating and drinking well.  Oct. 13, 2021: Rachel Park is seen today for follow up of her atrial fib, CAD She has continued to be fatigued She has been tried on tikosyn ( March 2020), failed to stay in rhythm.  Tried amiodarone.  She is more short of breath recently .  Has lots of fatigue  She has seen Rachel Park .  They discussed possible AV node ablation and pacer placement  Is on lasix for ankle edema  June 28, 2020: Rachel Park is seen today for follow up of her  atrial fib Has  chronic fatigue  Is not sleeping well at night.   Wakes up 3-5 times at night  Her memory is not as good as she would like   Oct. 24, 2022 Rachel Park is seen today for follow up of her atrial fib, CAD She stopped her laisx on Sept. Her BP was too low and she was going to the bathroom all the time  Her recent BP look good .  She has chronic dyspena Her dyspnea has not worsened .  We discussed taking the lasix  Still taking the spironolactone 25 mg a day  Has been started on Fosamax.   Oct. 30, 2023  Rachel Park is seen for follow up of her atrial fib, CAD    Past Medical History:  Diagnosis Date   Arthritis    Complication of anesthesia    Coronary artery disease    Hypertension    Hypothyroidism    PAF (paroxysmal atrial fibrillation) ([place])    on Xarelto for maybe a year, opted to stop   PONV (postoperative nausea and vomiting)    Renal infarction [facility])    Small bowel obstruction ([place]) 06/2015   Stroke ([place])    oct 2016   TIA (transient ischemic attack) 03/24/2015   Vertigo, benign positional    to be evaluated, intermittent    Past Surgical History:  Procedure Laterality Date   ABDOMINAL HYSTERECTOMY     APPLICATION OF WOUND VAC N/A 07/23/2018   Procedure: APPLICATION OF WOUND VAC;  Surgeon: Rolm Bookbinder, MD;  Location: [place];  Service: General;  Laterality: N/A;   BOWEL RESECTION N/A 07/23/2018   Procedure: SMALL BOWEL RESECTION;  Surgeon: Rolm Bookbinder, MD;  Location: [place];  Service: General;  Laterality: N/A;   CARDIAC CATHETERIZATION N/A 12/24/2014   Procedure: Left Heart Cath and Coronary Angiography;  Surgeon: Sherren Mocha, MD;  Location: [place] CV LAB;  Service: Cardiovascular;  Laterality: N/A;   CARDIOVERSION N/A 05/22/2018   Procedure: CARDIOVERSION;  Surgeon: Josue Hector, MD;  Location: [facility] ENDOSCOPY;  Service: Cardiovascular;  Laterality: N/A;   CATARACT EXTRACTION Bilateral    COLONOSCOPY WITH PROPOFOL N/A 12/01/2013   Procedure: COLONOSCOPY WITH  PROPOFOL;  Surgeon: Garlan Fair, MD;  [facility] ENDOSCOPY;  Service: Endoscopy;  Laterality: N/A;   COMPLEX WOUND CLOSURE  07/25/2018   Procedure: Complex Wound Closure;  Surgeon: Rolm Bookbinder, MD;  Location: [place];  Service: General;;   Pennie Rushing  07/25/2018   Procedure: Ileocecetomy;  Surgeon: Rolm Bookbinder, MD;  Location: [place];  Service: General;;   [place] N/A 03/10/2016   Procedure: INCISIONAL HERNIA REPAIR TIMES TWO;  Surgeon: Armandina Gemma, MD;  Location: [place];  Service: General;  Laterality: N/A;   INGUINAL HERNIA REPAIR Bilateral 03/10/2016   Procedure: BILATERAL INGUINAL HERNIA REPAIRS;  Surgeon: Armandina Gemma, MD;  Location: [place];  Service: General;  Laterality: Bilateral;   INSERTION OF MESH N/A 03/10/2016   Procedure: INSERTION OF MESH TO BILATERAL GROINS AND ABDOMEN;  Surgeon: Armandina Gemma, MD;  Location: [place];  Service: General;  Laterality: N/A;   laparotomy  06/2015   LAPAROTOMY N/A 07/23/2018   Procedure: EXPLORATORY LAPAROTOMY;  Surgeon: Rolm Bookbinder, MD;  Location: [place];  Service: General;  Laterality: N/A;   LEFT HEART CATH AND CORONARY ANGIOGRAPHY N/A 09/27/2016   Procedure: Left Heart Cath and Coronary Angiography;  Surgeon: Sherren Mocha, MD;  Location: [place] CV LAB;  Service: Cardiovascular;  Laterality: N/A;   TONSILLECTOMY  Current Outpatient Medications  Medication Sig Dispense Refill   acetaminophen (TYLENOL) 500 MG tablet Take 1,000 mg by mouth every 6 (six) hours as needed for mild pain.      alendronate (FOSAMAX) 70 MG tablet Take 1 tablet by mouth once a week.     apixaban (ELIQUIS) 2.5 MG TABS tablet Take 1 tablet (2.5 mg total) by mouth 2 (two) times daily. 60 tablet 5   Calcium Carb-Cholecalciferol (CALCIUM + D3) 600-800 MG-UNIT TABS Take 1 tablet by mouth daily.     Cholecalciferol (VITAMIN D3) 50 MCG (2000 UT) TABS Take 2,000 Units by mouth daily with lunch.     diltiazem (CARDIZEM CD) 300 MG 24 hr  capsule Take 1 capsule (300 mg total) by mouth daily. 30 capsule 2   famotidine (PEPCID) 10 MG tablet 1 tablet as needed     levothyroxine (SYNTHROID) 88 MCG tablet Take 88 mcg by mouth every morning.     metoprolol succinate (TOPROL-XL) 100 MG 24 hr tablet Take 1 tablet (100 mg total) by mouth 2 (two) times daily. Take with or immediately following a meal. 180 tablet 3   NITROSTAT 0.4 MG SL tablet Place 1 tablet (0.4 mg total) under the tongue every 5 (five) minutes as needed. Chest pain 25 tablet 6   Polyethyl Glycol-Propyl Glycol 0.4-0.3 % SOLN Place 1-2 drops into both eyes every other day.     rosuvastatin (CRESTOR) 10 MG tablet Take 1 tablet by mouth once daily 90 tablet 1   spironolactone (ALDACTONE) 25 MG tablet Take 1 tablet by mouth once daily 90 tablet 2   No current facility-administered medications for this visit.    Allergies:   Propofol and Other    Social History:  The patient  reports that she has never smoked. She has never used smokeless tobacco. She reports that she does not currently use alcohol. She reports that she does not use drugs.   Family History:  The patient's family history includes Cancer in her sister; Heart attack (age of onset: 15) in her father; Stroke in her mother.    ROS:      Physical Exam: Blood pressure 116/68, pulse 88, height '5\' 1"'$  (1.549 m), weight 152 lb 6.4 oz (69.1 kg), SpO2 98 %.       GEN:  elderly female,   in no acute distress HEENT: Normal NECK: No JVD; No carotid bruits LYMPHATICS: No lymphadenopathy CARDIAC: Irreg.   Irreg.  RESPIRATORY:  Clear to auscultation without rales, wheezing or rhonchi  ABDOMEN: Soft, non-tender, non-distended MUSCULOSKELETAL:  No edema; No deformity  SKIN: Warm and dry NEUROLOGIC:  Alert and oriented x 3   EKG:    Oct. 30,  atrial fib with HR of 72,  no changes   Recent Labs: 06/24/2021: ALT 18    Lipid Panel    Component Value Date/Time   CHOL 123 06/28/2020 0924   TRIG 78 06/28/2020  0924   HDL 67 06/28/2020 0924   CHOLHDL 1.8 06/28/2020 0924   CHOLHDL 1.9 08/22/2019 0552   VLDL 11 08/22/2019 0552   LDLCALC 41 06/28/2020 0924      Wt Readings from Last 3 Encounters:  01/02/22 152 lb 6.4 oz (69.1 kg)  06/27/21 152 lb (68.9 kg)  12/27/20 149 lb 3.2 oz (67.7 kg)      Other studies Reviewed: Additional studies/ records that were reviewed today include: . Review of the above records demonstrates:    ASSESSMENT AND PLAN:  1.  CAD -  no angina   2. Persistent  atrial fib/ atrial flutter  - CHADS2VASC score of 4-6  ( female, age > 56, vascular disease,   TIA and a CVA ( as a complication to her cath)     She is tolerating afib . Discussed price of the DOACs    3.  Pulmonary Hypertension:   stable     3. Hyperlipidemia:   stable    5.  Hypertension::     BP is stable ,  well controlled. .  6.  Chronic diastolic congestive heart failure: Fluid management seems to be fairly well controlled on the spironolactone.  She does not appear to need Lasix at this point.  We will continue to watch her closely.  Current medicines are reviewed at length with the patient today.  The patient does not have concerns regarding medicines.  The following changes have been made:  no change  Labs/ tests ordered today include:   No orders of the defined types were placed in this encounter.     Disposition:     Rachel Moores, MD  01/02/2022 10:32 AM    [facility] [address], [place], [place]  [address] Phone: [phone]; Fax: [phone]

## 2022-01-02 ENCOUNTER — Encounter: Payer: Self-pay | Admitting: Cardiovascular Disease

## 2022-01-02 ENCOUNTER — Ambulatory Visit: Payer: Medicare HMO | Attending: Cardiovascular Disease | Admitting: Cardiovascular Disease

## 2022-01-02 VITALS — BP 116/68 | HR 88 | Ht 61.0 in | Wt 152.4 lb

## 2022-01-02 DIAGNOSIS — I4821 Permanent atrial fibrillation: Secondary | ICD-10-CM | POA: Diagnosis not present

## 2022-01-02 DIAGNOSIS — I2510 Atherosclerotic heart disease of native coronary artery without angina pectoris: Secondary | ICD-10-CM | POA: Diagnosis not present

## 2022-01-02 DIAGNOSIS — I10 Essential (primary) hypertension: Secondary | ICD-10-CM

## 2022-01-02 NOTE — Patient Instructions (Signed)
Check price of the following   Eliquis  Xarelto Sayvasa Pradaxa    Medication Instructions:  Your physician recommends that you continue on your current medications as directed. Please refer to the Current Medication list given to you today.  *If you need a refill on your cardiac medications before your next appointment, please call your pharmacy*   Lab Work: NONE If you have labs (blood work) drawn today and your tests are completely normal, you will receive your results only by: [place] (if you have MyChart) OR A paper copy in the mail If you have any lab test that is abnormal or we need to change your treatment, we will call you to review the results.   Testing/Procedures: NONE   Follow-Up: At [facility], you and your health needs are our priority.  As part of our continuing mission to provide you with exceptional heart care, we have created designated Provider Care Teams.  These Care Teams include your primary Cardiologist (physician) and Advanced Practice Providers (APPs -  Physician Assistants and Nurse Practitioners) who all work together to provide you with the care you need, when you need it.  We recommend signing up for the patient portal called "MyChart".  Sign up information is provided on this After Visit Summary.  MyChart is used to connect with patients for Virtual Visits (Telemedicine).  Patients are able to view lab/test results, encounter notes, upcoming appointments, etc.  Non-urgent messages can be sent to your provider as well.   To learn more about what you can do with MyChart, go to [email or web address].    Your next appointment:   1 year(s)  The format for your next appointment:   In Person  Provider:   Mertie Moores, MD        Important Information About Sugar

## 2022-05-05 DIAGNOSIS — E032 Hypothyroidism due to medicaments and other exogenous substances: Secondary | ICD-10-CM | POA: Diagnosis not present

## 2022-05-05 DIAGNOSIS — I110 Hypertensive heart disease with heart failure: Secondary | ICD-10-CM | POA: Diagnosis not present

## 2022-05-05 DIAGNOSIS — M8580 Other specified disorders of bone density and structure, unspecified site: Secondary | ICD-10-CM | POA: Diagnosis not present

## 2022-05-05 DIAGNOSIS — I5022 Chronic systolic (congestive) heart failure: Secondary | ICD-10-CM | POA: Diagnosis not present

## 2022-05-05 DIAGNOSIS — N3941 Urge incontinence: Secondary | ICD-10-CM | POA: Diagnosis not present

## 2022-05-05 DIAGNOSIS — I2510 Atherosclerotic heart disease of native coronary artery without angina pectoris: Secondary | ICD-10-CM | POA: Diagnosis not present

## 2022-05-05 DIAGNOSIS — D6869 Other thrombophilia: Secondary | ICD-10-CM | POA: Diagnosis not present

## 2022-05-05 DIAGNOSIS — I700 Atherosclerosis of aorta: Secondary | ICD-10-CM | POA: Diagnosis not present

## 2022-05-05 DIAGNOSIS — E785 Hyperlipidemia, unspecified: Secondary | ICD-10-CM | POA: Diagnosis not present

## 2022-05-05 DIAGNOSIS — Z8719 Personal history of other diseases of the digestive system: Secondary | ICD-10-CM | POA: Diagnosis not present

## 2022-05-05 DIAGNOSIS — G3184 Mild cognitive impairment, so stated: Secondary | ICD-10-CM | POA: Diagnosis not present

## 2022-05-05 DIAGNOSIS — I4821 Permanent atrial fibrillation: Secondary | ICD-10-CM | POA: Diagnosis not present

## 2022-05-15 DIAGNOSIS — R079 Chest pain, unspecified: Secondary | ICD-10-CM | POA: Diagnosis not present

## 2022-06-19 ENCOUNTER — Encounter (INDEPENDENT_AMBULATORY_CARE_PROVIDER_SITE_OTHER): Payer: Medicare HMO | Admitting: Ophthalmology

## 2022-07-12 DIAGNOSIS — I429 Cardiomyopathy, unspecified: Secondary | ICD-10-CM | POA: Diagnosis not present

## 2022-07-12 DIAGNOSIS — I10 Essential (primary) hypertension: Secondary | ICD-10-CM | POA: Diagnosis not present

## 2022-07-12 DIAGNOSIS — E785 Hyperlipidemia, unspecified: Secondary | ICD-10-CM | POA: Diagnosis not present

## 2022-07-12 DIAGNOSIS — R32 Unspecified urinary incontinence: Secondary | ICD-10-CM | POA: Diagnosis not present

## 2022-07-12 DIAGNOSIS — I4891 Unspecified atrial fibrillation: Secondary | ICD-10-CM | POA: Diagnosis not present

## 2022-07-12 DIAGNOSIS — M1990 Unspecified osteoarthritis, unspecified site: Secondary | ICD-10-CM | POA: Diagnosis not present

## 2022-07-12 DIAGNOSIS — I2510 Atherosclerotic heart disease of native coronary artery without angina pectoris: Secondary | ICD-10-CM | POA: Diagnosis not present

## 2022-07-12 DIAGNOSIS — D6869 Other thrombophilia: Secondary | ICD-10-CM | POA: Diagnosis not present

## 2022-07-12 DIAGNOSIS — Z8249 Family history of ischemic heart disease and other diseases of the circulatory system: Secondary | ICD-10-CM | POA: Diagnosis not present

## 2022-07-12 DIAGNOSIS — G3184 Mild cognitive impairment, so stated: Secondary | ICD-10-CM | POA: Diagnosis not present

## 2022-07-12 DIAGNOSIS — I4892 Unspecified atrial flutter: Secondary | ICD-10-CM | POA: Diagnosis not present

## 2022-07-12 DIAGNOSIS — E039 Hypothyroidism, unspecified: Secondary | ICD-10-CM | POA: Diagnosis not present

## 2022-07-17 ENCOUNTER — Inpatient Hospital Stay (HOSPITAL_COMMUNITY)
Admission: EM | Admit: 2022-07-17 | Discharge: 2022-07-20 | DRG: 445 | Disposition: A | Discharge status: Home / Self Care | Payer: Medicare HMO | Attending: Internal Medicine | Admitting: Internal Medicine

## 2022-07-17 ENCOUNTER — Emergency Department (HOSPITAL_COMMUNITY): Payer: Medicare HMO

## 2022-07-17 ENCOUNTER — Other Ambulatory Visit: Payer: Self-pay

## 2022-07-17 DIAGNOSIS — Z6828 Body mass index (BMI) 28.0-28.9, adult: Secondary | ICD-10-CM

## 2022-07-17 DIAGNOSIS — K8000 Calculus of gallbladder with acute cholecystitis without obstruction: Secondary | ICD-10-CM | POA: Diagnosis not present

## 2022-07-17 DIAGNOSIS — I10 Essential (primary) hypertension: Secondary | ICD-10-CM | POA: Diagnosis not present

## 2022-07-17 DIAGNOSIS — I4819 Other persistent atrial fibrillation: Secondary | ICD-10-CM | POA: Diagnosis not present

## 2022-07-17 DIAGNOSIS — I119 Hypertensive heart disease without heart failure: Secondary | ICD-10-CM | POA: Diagnosis not present

## 2022-07-17 DIAGNOSIS — Z66 Do not resuscitate: Secondary | ICD-10-CM | POA: Diagnosis present

## 2022-07-17 DIAGNOSIS — E872 Acidosis, unspecified: Secondary | ICD-10-CM | POA: Diagnosis present

## 2022-07-17 DIAGNOSIS — K819 Cholecystitis, unspecified: Secondary | ICD-10-CM | POA: Diagnosis not present

## 2022-07-17 DIAGNOSIS — Z7901 Long term (current) use of anticoagulants: Secondary | ICD-10-CM

## 2022-07-17 DIAGNOSIS — Z955 Presence of coronary angioplasty implant and graft: Secondary | ICD-10-CM

## 2022-07-17 DIAGNOSIS — E8809 Other disorders of plasma-protein metabolism, not elsewhere classified: Secondary | ICD-10-CM | POA: Diagnosis present

## 2022-07-17 DIAGNOSIS — M1990 Unspecified osteoarthritis, unspecified site: Secondary | ICD-10-CM | POA: Diagnosis not present

## 2022-07-17 DIAGNOSIS — I878 Other specified disorders of veins: Secondary | ICD-10-CM | POA: Diagnosis not present

## 2022-07-17 DIAGNOSIS — E663 Overweight: Secondary | ICD-10-CM | POA: Diagnosis not present

## 2022-07-17 DIAGNOSIS — Z79890 Hormone replacement therapy (postmenopausal): Secondary | ICD-10-CM

## 2022-07-17 DIAGNOSIS — E039 Hypothyroidism, unspecified: Secondary | ICD-10-CM | POA: Diagnosis not present

## 2022-07-17 DIAGNOSIS — Z8673 Personal history of transient ischemic attack (TIA), and cerebral infarction without residual deficits: Secondary | ICD-10-CM

## 2022-07-17 DIAGNOSIS — Z888 Allergy status to other drugs, medicaments and biological substances status: Secondary | ICD-10-CM

## 2022-07-17 DIAGNOSIS — E871 Hypo-osmolality and hyponatremia: Secondary | ICD-10-CM | POA: Diagnosis not present

## 2022-07-17 DIAGNOSIS — K8010 Calculus of gallbladder with chronic cholecystitis without obstruction: Secondary | ICD-10-CM | POA: Diagnosis present

## 2022-07-17 DIAGNOSIS — Z434 Encounter for attention to other artificial openings of digestive tract: Secondary | ICD-10-CM | POA: Diagnosis not present

## 2022-07-17 DIAGNOSIS — Z79899 Other long term (current) drug therapy: Secondary | ICD-10-CM | POA: Diagnosis not present

## 2022-07-17 DIAGNOSIS — D649 Anemia, unspecified: Secondary | ICD-10-CM | POA: Diagnosis not present

## 2022-07-17 DIAGNOSIS — Z823 Family history of stroke: Secondary | ICD-10-CM

## 2022-07-17 DIAGNOSIS — Z9049 Acquired absence of other specified parts of digestive tract: Secondary | ICD-10-CM

## 2022-07-17 DIAGNOSIS — Z7983 Long term (current) use of bisphosphonates: Secondary | ICD-10-CM | POA: Diagnosis not present

## 2022-07-17 DIAGNOSIS — Z885 Allergy status to narcotic agent status: Secondary | ICD-10-CM | POA: Diagnosis not present

## 2022-07-17 DIAGNOSIS — I2510 Atherosclerotic heart disease of native coronary artery without angina pectoris: Secondary | ICD-10-CM | POA: Diagnosis not present

## 2022-07-17 DIAGNOSIS — Z90710 Acquired absence of both cervix and uterus: Secondary | ICD-10-CM

## 2022-07-17 DIAGNOSIS — N179 Acute kidney failure, unspecified: Secondary | ICD-10-CM | POA: Diagnosis not present

## 2022-07-17 DIAGNOSIS — Z743 Need for continuous supervision: Secondary | ICD-10-CM | POA: Diagnosis not present

## 2022-07-17 DIAGNOSIS — R7401 Elevation of levels of liver transaminase levels: Secondary | ICD-10-CM | POA: Diagnosis present

## 2022-07-17 DIAGNOSIS — K810 Acute cholecystitis: Secondary | ICD-10-CM | POA: Diagnosis not present

## 2022-07-17 DIAGNOSIS — I25119 Atherosclerotic heart disease of native coronary artery with unspecified angina pectoris: Secondary | ICD-10-CM | POA: Diagnosis not present

## 2022-07-17 DIAGNOSIS — K8020 Calculus of gallbladder without cholecystitis without obstruction: Secondary | ICD-10-CM | POA: Diagnosis not present

## 2022-07-17 DIAGNOSIS — Z8249 Family history of ischemic heart disease and other diseases of the circulatory system: Secondary | ICD-10-CM

## 2022-07-17 DIAGNOSIS — R1084 Generalized abdominal pain: Secondary | ICD-10-CM | POA: Diagnosis not present

## 2022-07-17 LAB — LIPASE, BLOOD: Lipase: 30 U/L (ref 11–51)

## 2022-07-17 LAB — CBC WITH DIFFERENTIAL/PLATELET
Abs Immature Granulocytes: 0.13 10*3/uL — ABNORMAL HIGH (ref 0.00–0.07)
Basophils Absolute: 0.1 10*3/uL (ref 0.0–0.1)
Basophils Relative: 0 %
Eosinophils Absolute: 0 10*3/uL (ref 0.0–0.5)
Eosinophils Relative: 0 %
HCT: 42.6 % (ref 36.0–46.0)
Hemoglobin: 13.6 g/dL (ref 12.0–15.0)
Immature Granulocytes: 1 %
Lymphocytes Relative: 11 %
Lymphs Abs: 1.4 10*3/uL (ref 0.7–4.0)
MCH: 31.6 pg (ref 26.0–34.0)
MCHC: 31.9 g/dL (ref 30.0–36.0)
MCV: 99.1 fL (ref 80.0–100.0)
Monocytes Absolute: 0.8 10*3/uL (ref 0.1–1.0)
Monocytes Relative: 6 %
Neutro Abs: 10.3 10*3/uL — ABNORMAL HIGH (ref 1.7–7.7)
Neutrophils Relative %: 82 %
Platelets: 231 10*3/uL (ref 150–400)
RBC: 4.3 MIL/uL (ref 3.87–5.11)
RDW: 13.5 % (ref 11.5–15.5)
WBC: 12.7 10*3/uL — ABNORMAL HIGH (ref 4.0–10.5)
nRBC: 0 % (ref 0.0–0.2)

## 2022-07-17 LAB — URINALYSIS, ROUTINE W REFLEX MICROSCOPIC
Bacteria, UA: NONE SEEN
Bilirubin Urine: NEGATIVE
Glucose, UA: NEGATIVE mg/dL
Hgb urine dipstick: NEGATIVE
Ketones, ur: 5 mg/dL — AB
Leukocytes,Ua: NEGATIVE
Nitrite: NEGATIVE
Protein, ur: 30 mg/dL — AB
Specific Gravity, Urine: 1.035 — ABNORMAL HIGH (ref 1.005–1.030)
pH: 5 (ref 5.0–8.0)

## 2022-07-17 LAB — COMPREHENSIVE METABOLIC PANEL
ALT: 71 U/L — ABNORMAL HIGH (ref 0–44)
AST: 132 U/L — ABNORMAL HIGH (ref 15–41)
Albumin: 4.3 g/dL (ref 3.5–5.0)
Alkaline Phosphatase: 68 U/L (ref 38–126)
Anion gap: 17 — ABNORMAL HIGH (ref 5–15)
BUN: 25 mg/dL — ABNORMAL HIGH (ref 8–23)
CO2: 15 mmol/L — ABNORMAL LOW (ref 22–32)
Calcium: 10 mg/dL (ref 8.9–10.3)
Chloride: 100 mmol/L (ref 98–111)
Creatinine, Ser: 1.2 mg/dL — ABNORMAL HIGH (ref 0.44–1.00)
GFR, Estimated: 44 mL/min — ABNORMAL LOW (ref >60)
Glucose, Bld: 123 mg/dL — ABNORMAL HIGH (ref 70–99)
Potassium: 5.1 mmol/L (ref 3.5–5.1)
Sodium: 132 mmol/L — ABNORMAL LOW (ref 135–145)
Total Bilirubin: 0.9 mg/dL (ref 0.3–1.2)
Total Protein: 6.8 g/dL (ref 6.5–8.1)

## 2022-07-17 LAB — LACTIC ACID, PLASMA: Lactic Acid, Venous: 1.5 mmol/L (ref 0.5–1.9)

## 2022-07-17 MED ORDER — DILTIAZEM HCL ER COATED BEADS 300 MG PO CP24
300.0000 mg | ORAL_CAPSULE | Freq: Every day | ORAL | Status: DC
  Administered 2022-07-18 – 2022-07-20 (×3): 300 mg via ORAL
  Filled 2022-07-17 (×3): qty 1

## 2022-07-17 MED ORDER — FENTANYL CITRATE PF 50 MCG/ML IJ SOSY
50.0000 ug | PREFILLED_SYRINGE | Freq: Once | INTRAMUSCULAR | Status: AC
  Administered 2022-07-17: 50 ug via INTRAVENOUS
  Filled 2022-07-17: qty 1

## 2022-07-17 MED ORDER — ONDANSETRON HCL 4 MG PO TABS: 4.0000 mg | ORAL_TABLET | Freq: Four times a day (QID) | ORAL | Status: DC | PRN

## 2022-07-17 MED ORDER — ONDANSETRON HCL 4 MG/2ML IJ SOLN: 4.0000 mg | Freq: Four times a day (QID) | INTRAMUSCULAR | Status: DC | PRN

## 2022-07-17 MED ORDER — LACTATED RINGERS IV BOLUS
1000.0000 mL | Freq: Once | INTRAVENOUS | Status: AC
  Administered 2022-07-17: 1000 mL via INTRAVENOUS

## 2022-07-17 MED ORDER — SODIUM CHLORIDE 0.9% FLUSH
3.0000 mL | Freq: Two times a day (BID) | INTRAVENOUS | Status: DC
  Administered 2022-07-18 – 2022-07-20 (×4): 3 mL via INTRAVENOUS

## 2022-07-17 MED ORDER — LEVOTHYROXINE SODIUM 88 MCG PO TABS
88.0000 ug | ORAL_TABLET | Freq: Every day | ORAL | Status: DC
  Administered 2022-07-18 – 2022-07-20 (×3): 88 ug via ORAL
  Filled 2022-07-17 (×3): qty 1

## 2022-07-17 MED ORDER — ROSUVASTATIN CALCIUM 5 MG PO TABS
10.0000 mg | ORAL_TABLET | Freq: Every day | ORAL | Status: DC
  Administered 2022-07-18 – 2022-07-20 (×3): 10 mg via ORAL
  Filled 2022-07-17 (×3): qty 2

## 2022-07-17 MED ORDER — PIPERACILLIN-TAZOBACTAM 3.375 G IVPB
3.3750 g | Freq: Three times a day (TID) | INTRAVENOUS | Status: DC
  Administered 2022-07-18 – 2022-07-20 (×7): 3.375 g via INTRAVENOUS
  Filled 2022-07-17 (×8): qty 50

## 2022-07-17 MED ORDER — PIPERACILLIN-TAZOBACTAM 3.375 G IVPB: 3.3750 g | Freq: Three times a day (TID) | INTRAVENOUS | Status: DC

## 2022-07-17 MED ORDER — PIPERACILLIN-TAZOBACTAM 3.375 G IVPB
3.3750 g | Freq: Once | INTRAVENOUS | Status: AC
  Administered 2022-07-17: 3.375 g via INTRAVENOUS
  Filled 2022-07-17: qty 50

## 2022-07-17 MED ORDER — LACTATED RINGERS IV SOLN: INTRAVENOUS | Status: DC

## 2022-07-17 MED ORDER — ONDANSETRON HCL 4 MG/2ML IJ SOLN
4.0000 mg | Freq: Once | INTRAMUSCULAR | Status: AC
  Administered 2022-07-17: 4 mg via INTRAVENOUS
  Filled 2022-07-17: qty 2

## 2022-07-17 MED ORDER — METOPROLOL SUCCINATE ER 50 MG PO TB24
100.0000 mg | ORAL_TABLET | Freq: Two times a day (BID) | ORAL | Status: DC
  Administered 2022-07-18 – 2022-07-20 (×6): 100 mg via ORAL
  Filled 2022-07-17 (×6): qty 2

## 2022-07-17 MED ORDER — FENTANYL CITRATE PF 50 MCG/ML IJ SOSY: 50.0000 ug | PREFILLED_SYRINGE | Freq: Once | INTRAMUSCULAR | Status: DC | PRN

## 2022-07-17 MED ORDER — IOHEXOL 350 MG/ML SOLN
75.0000 mL | Freq: Once | INTRAVENOUS | Status: AC | PRN
  Administered 2022-07-17: 75 mL via INTRAVENOUS

## 2022-07-17 MED ORDER — HYDROMORPHONE HCL 1 MG/ML IJ SOLN
0.5000 mg | INTRAMUSCULAR | Status: DC | PRN
  Administered 2022-07-18 (×2): 0.5 mg via INTRAVENOUS
  Filled 2022-07-17 (×2): qty 0.5

## 2022-07-17 NOTE — ED Provider Notes (Signed)
[place] EMERGENCY DEPARTMENT AT [facility] Provider Note   CSN: [id number] Arrival date & time: 07/17/22  1843     History  No chief complaint on file.   Rachel Park is a 89 y.o. female.  HPI 89 year old female history of CAD, hypertension, hypothyroidism, atrial fibrillation on Eliquis, multiple bowel obstructions requiring prior resections presenting for abdominal pain.  Patient is here with her son who is a Physiological scientist here.  Per chart review, last bowel obstruction was in May 2020, she was found to have necrotic bowel, internal hernia, bowel obstruction.  She required ileocecectomy with ileocolonic anastomosis.  Patient is doing well but developed severe diffuse abdominal pain today.  She had nausea but no vomiting.  She has had 1 small bowel movement today less than normal, no melena or hematochezia.  No chest pain or difficulty breathing.  No fevers or chills.  She is in severe pain.     Home Medications Prior to Admission medications   Medication Sig Start Date End Date Taking? Authorizing Provider  acetaminophen (TYLENOL) 500 MG tablet Take 1,000 mg by mouth every 6 (six) hours as needed for mild pain.    Yes [provider]  alendronate (FOSAMAX) 70 MG tablet Take 70 mg by mouth once a week. Saturday 11/22/20  Yes [provider]  apixaban (ELIQUIS) 2.5 MG TABS tablet Take 1 tablet (2.5 mg total) by mouth 2 (two) times daily. 08/25/19  Yes Marjie Skiff E, PA-C  Calcium Carb-Cholecalciferol (CALCIUM + D3) 600-800 MG-UNIT TABS Take 1 tablet by mouth daily.   Yes [provider]  Cholecalciferol (VITAMIN D3) 50 MCG (2000 UT) TABS Take 2,000 Units by mouth daily with lunch.   Yes [provider]  diltiazem (TIAZAC) 300 MG 24 hr capsule Take 300 mg by mouth daily. 05/08/22  Yes [provider]  famotidine (PEPCID) 10 MG tablet Take 10 mg by mouth daily as needed for heartburn.   Yes [provider]   levothyroxine (SYNTHROID) 88 MCG tablet Take 88 mcg by mouth every morning. 10/30/19  Yes [provider]  metoprolol succinate (TOPROL-XL) 100 MG 24 hr tablet Take 1 tablet (100 mg total) by mouth 2 (two) times daily. Take with or immediately following a meal. 12/27/20  Yes Nahser, Deloris Ping, MD  MYRBETRIQ 50 MG TB24 tablet Take 50 mg by mouth daily. 05/05/22  Yes [provider]  NITROSTAT 0.4 MG SL tablet Place 1 tablet (0.4 mg total) under the tongue every 5 (five) minutes as needed. Chest pain 06/24/18  Yes Nahser, Deloris Ping, MD  Polyethyl Glycol-Propyl Glycol 0.4-0.3 % SOLN Place 1-2 drops into both eyes daily as needed (for dry eye).   Yes [provider]  rosuvastatin (CRESTOR) 10 MG tablet Take 1 tablet by mouth once daily Patient taking differently: Take 10 mg by mouth daily. 12/26/21  Yes Nahser, Deloris Ping, MD  spironolactone (ALDACTONE) 25 MG tablet Take 1 tablet by mouth once daily Patient taking differently: Take 25 mg by mouth daily. 09/13/21  Yes Bhagat, Bhavinkumar, PA      Allergies    Propofol and Simvastatin    Review of Systems   Review of Systems  Gastrointestinal:  Positive for abdominal pain and nausea.  All other systems reviewed and are negative.   Physical Exam Updated Vital Signs BP (!) 140/84   Pulse 84   Temp 97.7 F (36.5 C) (Oral)   Resp (!) 24   Ht 5\' 1"  (1.549 m)  Wt 69 kg   SpO2 96%   BMI 28.74 kg/m  Physical Exam Vitals and nursing note reviewed.  Constitutional:      General: She is in acute distress.     Appearance: She is well-developed. She is ill-appearing.  HENT:     Head: Normocephalic and atraumatic.  Eyes:     Conjunctiva/sclera: Conjunctivae normal.  Cardiovascular:     Rate and Rhythm: Normal rate and regular rhythm.     Heart sounds: No murmur heard. Pulmonary:     Effort: Pulmonary effort is normal. No respiratory distress.     Breath sounds: Normal breath sounds.  Abdominal:     General: There is  distension.     Palpations: Abdomen is soft.     Tenderness: There is abdominal tenderness. There is guarding. There is no right CVA tenderness, left CVA tenderness or rebound.  Musculoskeletal:        General: No swelling.     Cervical back: Neck supple.     Right lower leg: No edema.     Left lower leg: No edema.  Skin:    General: Skin is warm and dry.     Capillary Refill: Capillary refill takes less than 2 seconds.  Neurological:     General: No focal deficit present.     Mental Status: She is alert.  Psychiatric:        Mood and Affect: Mood normal.     ED Results / Procedures / Treatments   Labs (all labs ordered are listed, but only abnormal results are displayed) Labs Reviewed  CBC WITH DIFFERENTIAL/PLATELET - Abnormal; Notable for the following components:      Result Value   WBC 12.7 (*)    Neutro Abs 10.3 (*)    Abs Immature Granulocytes 0.13 (*)    All other components within normal limits  COMPREHENSIVE METABOLIC PANEL - Abnormal; Notable for the following components:   Sodium 132 (*)    CO2 15 (*)    Glucose, Bld 123 (*)    BUN 25 (*)    Creatinine, Ser 1.20 (*)    AST 132 (*)    ALT 71 (*)    GFR, Estimated 44 (*)    Anion gap 17 (*)    All other components within normal limits  URINALYSIS, ROUTINE W REFLEX MICROSCOPIC - Abnormal; Notable for the following components:   Specific Gravity, Urine 1.035 (*)    Ketones, ur 5 (*)    Protein, ur 30 (*)    All other components within normal limits  CULTURE, BLOOD (ROUTINE X 2)  CULTURE, BLOOD (ROUTINE X 2)  LIPASE, BLOOD  LACTIC ACID, PLASMA  LACTIC ACID, PLASMA    EKG None  Radiology CT ABDOMEN PELVIS W CONTRAST  Result Date: 07/17/2022 CLINICAL DATA:  Suspected bowel obstruction EXAM: CT ABDOMEN AND PELVIS WITH CONTRAST TECHNIQUE: Multidetector CT imaging of the abdomen and pelvis was performed using the standard protocol following bolus administration of intravenous contrast. RADIATION DOSE  REDUCTION: This exam was performed according to the departmental dose-optimization program which includes automated exposure control, adjustment of the mA and/or kV according to patient size and/or use of iterative reconstruction technique. CONTRAST:  75mL OMNIPAQUE IOHEXOL 350 MG/ML SOLN COMPARISON:  CT 08/02/2018, 07/22/2018 FINDINGS: Lower chest: Lung bases demonstrate no acute airspace disease. Cardiomegaly. Trace right-sided pleural effusion. Hepatobiliary: No focal hepatic abnormality. Multiple gallstones. Abnormal fluid adjacent to the gallbladder either representing Peri coli cystic fluid versus marked gallbladder wall thickening. No  biliary dilatation Pancreas: Unremarkable. No pancreatic ductal dilatation or surrounding inflammatory changes. Spleen: Normal in size without focal abnormality. Adrenals/Urinary Tract: Adrenal glands are within normal limits. Kidneys show no hydronephrosis. Cortical scarring mid pole right kidney. The bladder is unremarkable. Stomach/Bowel: The stomach is nonenlarged. Status post small bowel resection with ileo colic anastomosis in the right lower quadrant. Mild fecalized appearance of small bowel in the region probably due to slow transit. No convincing obstruction. No acute bowel wall thickening. Diverticular disease of the left colon. Vascular/Lymphatic: Mild aortic atherosclerosis. No aneurysm. No suspicious lymph nodes Reproductive: Status post hysterectomy. No adnexal masses. Other: No free air. Small volume free fluid within the abdomen and pelvis. Anterior abdominal wall laxity. Small inferior ventral hernia containing mesenteric fat, small ascites as well as bowel but no obstructive change. Musculoskeletal: Chronic compression deformity at L1 IMPRESSION: 1. Multiple gallstones. Abnormal fluid adjacent to the gallbladder either representing pericholecystic fluid versus marked gallbladder wall thickening. Recommend correlation with right upper quadrant ultrasound. 2.  Status post small bowel resection with ileocolic anastomosis in the right lower quadrant. Mild fecalized appearance of small bowel in the region probably due to slow transit. No convincing evidence for bowel obstruction. 3. Small volume free fluid within the abdomen and pelvis. Diverticular disease of the left colon without acute wall thickening. 4. Cardiomegaly. Trace right-sided pleural effusion. 5. Small inferior ventral hernia containing mesenteric fat, small ascites as well as bowel but no obstructive change. Aortic Atherosclerosis (ICD10-I70.0). Electronically Signed   By: Jasmine Pang M.D.   On: 07/17/2022 19:56    Procedures Procedures    Medications Ordered in ED Medications  piperacillin-tazobactam (ZOSYN) IVPB 3.375 g (3.375 g Intravenous New Bag/Given 07/17/22 2023)  fentaNYL (SUBLIMAZE) injection 50 mcg (has no administration in time range)  fentaNYL (SUBLIMAZE) injection 50 mcg (50 mcg Intravenous Given 07/17/22 1917)  ondansetron (ZOFRAN) injection 4 mg (4 mg Intravenous Given 07/17/22 1917)  lactated ringers bolus 1,000 mL (0 mLs Intravenous Stopped 07/17/22 2138)  iohexol (OMNIPAQUE) 350 MG/ML injection 75 mL (75 mLs Intravenous Contrast Given 07/17/22 1921)  fentaNYL (SUBLIMAZE) injection 50 mcg (50 mcg Intravenous Given 07/17/22 1950)    ED Course/ Medical Decision Making/ A&P Clinical Course as of 07/17/22 2259  Mon Jul 17, 2022  1934 CT ABDOMEN PELVIS W CONTRAST [JD]    Clinical Course User Index [JD] Fulton Reek, MD                             Medical Decision Making Amount and/or Complexity of Data Reviewed Labs: ordered. Radiology: ordered. Decision-making details documented in ED Course.  Risk Prescription drug management. Decision regarding hospitalization.   89 year old female with complicated surgical history presenting for severe abdominal pain.  On presentation she is mildly tachypneic and in distress from severe abdominal pain.  She is tender but not  peritonitic.  Differential including bowel obstruction, mesenteric ischemia, cholecystitis, appendicitis, intra-abdominal infection.  She was given fentanyl for pain.  Code medical was activated to expedite CT scan.  Lab work reviewed notable for and again metabolic acidosis, AKI, mild transaminitis.  Sodium is 132.  Lactic acid ordered.  Lipase is normal.  Urinalysis without signs of infection.  Leukocytosis on CBC.  IV fluids given.  CT scan reviewed, concerning for cholecystitis with multiple stones in marked pericholecystic fluid.  General surgery was consulted evaluated, they recommend medicine admission for further management.  Blood cultures were drawn and patient was given Zosyn.  On repeat evaluation her pain is significantly improved.  Medicine was paged for admission.  Discussed with hospitalist and admitted for further management.        Final Clinical Impression(s) / ED Diagnoses Final diagnoses:  Cholecystitis    Rx / DC Orders ED Discharge Orders     None         Fulton Reek, MD 07/17/22 2259    Glendora Score, MD 07/18/22 1534

## 2022-07-17 NOTE — ED Notes (Signed)
Surgeon at bedside.  

## 2022-07-17 NOTE — H&P (Signed)
History and Physical    Rachel Park ZOX:[id number] DOB: May 08, 1931 DOA: 07/17/2022  PCP: Cleatis Polka., MD  Patient coming from: Home  I have personally briefly reviewed patient's old medical records in [facility]  Chief Complaint: Abdominal pain  HPI: Rachel Park is a 89 y.o. female with medical history significant for persistent A-fib on Eliquis, CAD s/p stent to LAD 2016, history of CVA, HTN, hypothyroidism, SBO s/p SBR and ileocecectomy with ABThera and takeback by Dr. Dwain Sarna in 2020 who presented to the ED for evaluation of abdominal pain.  Patient developed new onset abdominal pain around 9 AM today.  She had nausea but no emesis.  She does report having a normal-appearing bowel movement after her pain started.  She had an episode of chills and diaphoresis.  Denied chest pain or dyspnea.  She has a history of SBO and thought her pain may be due to recurrence.  EMS were called and she was brought to the ED for further evaluation.  ED Course  Labs/Imaging on admission: I have personally reviewed following labs and imaging studies.  Initial vitals showed BP 162/112, pulse 76, RR 33, temp 98.4 F, SpO2 100% on room air.  Labs show WBC 12.7, hemoglobin 13.6, platelets 231,000, sodium 132, potassium 5.1, bicarb 15, BUN 25, creatinine 1.20, serum glucose 123, AST 132, ALT 71, alk phos 68, total bilirubin 0.9, lipase 30.  Urinalysis negative for UTI.  Blood cultures in process.  CT abdomen/pelvis with contrast shows multiple gallstones, abnormal fluid adjacent to gallbladder representing pericholecystic fluid versus gallbladder wall thickening.  Prior SBR with ileocolic anastomosis in the RLQ noted.  No evidence for bowel obstruction.  Small volume free fluid within abdomen pelvis noted.  Diverticular disease of left colon without acute wall thickening seen.  Cardiomegaly with trace right-sided pleural effusion noted.  Patient was given 1 L LR, fentanyl, and IV Zosyn.   General surgery consulted and recommended medical admission.  The hospitalist service was consulted to admit for further evaluation and management.  Review of Systems: All systems reviewed and are negative except as documented in history of present illness above.   Past Medical History:  Diagnosis Date   Arthritis    Complication of anesthesia    Coronary artery disease    Hypertension    Hypothyroidism    PAF (paroxysmal atrial fibrillation) (HCC)    on Xarelto for maybe a year, opted to stop   PONV (postoperative nausea and vomiting)    Renal infarction (HCC)    Small bowel obstruction (HCC) 06/2015   Stroke (HCC)    oct 2016   TIA (transient ischemic attack) 03/24/2015   Vertigo, benign positional    to be evaluated, intermittent    Past Surgical History:  Procedure Laterality Date   ABDOMINAL HYSTERECTOMY     APPLICATION OF WOUND VAC N/A 07/23/2018   Procedure: APPLICATION OF WOUND VAC;  Surgeon: Emelia Loron, MD;  Location: [facility] OR;  Service: General;  Laterality: N/A;   BOWEL RESECTION N/A 07/23/2018   Procedure: SMALL BOWEL RESECTION;  Surgeon: Emelia Loron, MD;  Location: [facility] OR;  Service: General;  Laterality: N/A;   CARDIAC CATHETERIZATION N/A 12/24/2014   Procedure: Left Heart Cath and Coronary Angiography;  Surgeon: Tonny Bollman, MD;  Location: [facility] INVASIVE CV LAB;  Service: Cardiovascular;  Laterality: N/A;   CARDIOVERSION N/A 05/22/2018   Procedure: CARDIOVERSION;  Surgeon: Wendall Stade, MD;  Location: [facility] ENDOSCOPY;  Service: Cardiovascular;  Laterality: N/A;  CATARACT EXTRACTION Bilateral    COLONOSCOPY WITH PROPOFOL N/A 12/01/2013   Procedure: COLONOSCOPY WITH PROPOFOL;  Surgeon: Charolett Bumpers, MD;  Location: [facility] ENDOSCOPY;  Service: Endoscopy;  Laterality: N/A;   COMPLEX WOUND CLOSURE  07/25/2018   Procedure: Complex Wound Closure;  Surgeon: Emelia Loron, MD;  Location: [facility] OR;  Service: General;;   [place]  07/25/2018   Procedure:  Ileocecetomy;  Surgeon: Emelia Loron, MD;  Location: [facility] OR;  Service: General;;   INCISIONAL HERNIA REPAIR N/A 03/10/2016   Procedure: INCISIONAL HERNIA REPAIR TIMES TWO;  Surgeon: Darnell Level, MD;  Location: [facility] OR;  Service: General;  Laterality: N/A;   INGUINAL HERNIA REPAIR Bilateral 03/10/2016   Procedure: BILATERAL INGUINAL HERNIA REPAIRS;  Surgeon: Darnell Level, MD;  Location: [facility] OR;  Service: General;  Laterality: Bilateral;   INSERTION OF MESH N/A 03/10/2016   Procedure: INSERTION OF MESH TO BILATERAL GROINS AND ABDOMEN;  Surgeon: Darnell Level, MD;  Location: [facility] OR;  Service: General;  Laterality: N/A;   laparotomy  06/2015   LAPAROTOMY N/A 07/23/2018   Procedure: EXPLORATORY LAPAROTOMY;  Surgeon: Emelia Loron, MD;  Location: [facility] OR;  Service: General;  Laterality: N/A;   LEFT HEART CATH AND CORONARY ANGIOGRAPHY N/A 09/27/2016   Procedure: Left Heart Cath and Coronary Angiography;  Surgeon: Tonny Bollman, MD;  Location: [facility] INVASIVE CV LAB;  Service: Cardiovascular;  Laterality: N/A;   TONSILLECTOMY      Social History:  reports that she has never smoked. She has never used smokeless tobacco. She reports that she does not currently use alcohol. She reports that she does not use drugs.  Allergies  Allergen Reactions   Propofol Nausea And Vomiting    "with hysterectomy years back had post op N/V"   Simvastatin Other (See Comments)    Gi upset    Family History  Problem Relation Age of Onset   Heart attack Father 62       Died age 1   Stroke Mother    Cancer Sister        breast     Prior to Admission medications   Medication Sig Start Date End Date Taking? Authorizing Provider  acetaminophen (TYLENOL) 500 MG tablet Take 1,000 mg by mouth every 6 (six) hours as needed for mild pain.    Yes [provider]  alendronate (FOSAMAX) 70 MG tablet Take 70 mg by mouth once a week. Saturday 11/22/20  Yes [provider]  apixaban (ELIQUIS) 2.5 MG TABS tablet  Take 1 tablet (2.5 mg total) by mouth 2 (two) times daily. 08/25/19  Yes Marjie Skiff E, PA-C  Calcium Carb-Cholecalciferol (CALCIUM + D3) 600-800 MG-UNIT TABS Take 1 tablet by mouth daily.   Yes [provider]  Cholecalciferol (VITAMIN D3) 50 MCG (2000 UT) TABS Take 2,000 Units by mouth daily with lunch.   Yes [provider]  diltiazem (TIAZAC) 300 MG 24 hr capsule Take 300 mg by mouth daily. 05/08/22  Yes [provider]  famotidine (PEPCID) 10 MG tablet Take 10 mg by mouth daily as needed for heartburn.   Yes [provider]  levothyroxine (SYNTHROID) 88 MCG tablet Take 88 mcg by mouth every morning. 10/30/19  Yes [provider]  metoprolol succinate (TOPROL-XL) 100 MG 24 hr tablet Take 1 tablet (100 mg total) by mouth 2 (two) times daily. Take with or immediately following a meal. 12/27/20  Yes Nahser, Deloris Ping, MD  MYRBETRIQ 50 MG TB24 tablet Take 50 mg by mouth daily.  05/05/22  Yes [provider]  NITROSTAT 0.4 MG SL tablet Place 1 tablet (0.4 mg total) under the tongue every 5 (five) minutes as needed. Chest pain 06/24/18  Yes Nahser, Deloris Ping, MD  Polyethyl Glycol-Propyl Glycol 0.4-0.3 % SOLN Place 1-2 drops into both eyes daily as needed (for dry eye).   Yes [provider]  rosuvastatin (CRESTOR) 10 MG tablet Take 1 tablet by mouth once daily Patient taking differently: Take 10 mg by mouth daily. 12/26/21  Yes Nahser, Deloris Ping, MD  spironolactone (ALDACTONE) 25 MG tablet Take 1 tablet by mouth once daily Patient taking differently: Take 25 mg by mouth daily. 09/13/21  Yes Manson Passey, PA    Physical Exam: Vitals:   07/17/22 1937 07/17/22 2024 07/17/22 2145 07/17/22 2259  BP: (!) 161/105 (!) 149/77 123/84 (!) 140/84  Pulse: 96 (!) 28 90 84  Resp: (!) 30 (!) 27 (!) 26 (!) 24  Temp:    97.7 F (36.5 C)  TempSrc:    Oral  SpO2: 100% 96% 94% 96%  Weight:      Height:       Constitutional: Resting in bed, NAD,  calm, comfortable Eyes: EOMI, lids and conjunctivae normal ENMT: Mucous membranes are moist. Posterior pharynx clear of any exudate or lesions.Normal dentition.  Neck: normal, supple, no masses. Respiratory: Expiratory crackles right lung base. Normal respiratory effort. No accessory muscle use.  Cardiovascular: Irregularly irregular, no murmurs / rubs / gallops. No extremity edema. 2+ pedal pulses. Abdomen: Left lower tenderness, no masses palpated. No hepatosplenomegaly. Bowel sounds positive.  Musculoskeletal: no clubbing / cyanosis. No joint deformity upper and lower extremities. Good ROM, no contractures. Normal muscle tone.  Skin: no rashes, lesions, ulcers. No induration Neurologic: Sensation intact. Strength 5/5 in all 4.  Psychiatric: Alert and oriented x 3. Normal mood.   EKG: Personally reviewed.  Atrial fibrillation, rate 70, low voltage.  No acute ischemic changes.  Similar to prior.  Assessment/Plan Principal Problem:   Cholecystitis with cholelithiasis Active Problems:   Hypothyroidism   AKI (acute kidney injury) (HCC)   Persistent atrial fibrillation/flutter   CAD (coronary artery disease)   Rachel Park is a 89 y.o. female with medical history significant for persistent A-fib on Eliquis, CAD s/p stent to LAD 2016, history of CVA, HTN, hypothyroidism, SBO s/p SBR and ileocecectomy with ABThera and takeback by Dr. Dwain Sarna in 2020 who is admitted with cholecystitis.  Assessment and Plan: Cholecystitis with cholelithiasis: General surgery following.  They will discuss percutaneous cholecystostomy tube placement versus cholecystectomy. -Keep n.p.o. -Continue IV Zosyn -Continue IV analgesics and antiemetics as needed -Gentle IV fluid hydration overnight while n.p.o. -Hold Eliquis  Acute kidney injury: Mildly creatinine 1.20 compared to baseline 0.6.  Gentle IV fluid hydration overnight.  Repeat labs in AM.  Persistent atrial fibrillation: Remains in atrial  fibrillation with controlled rate.  Holding Eliquis as above.  Continue home Toprol-XL and diltiazem.  CAD s/p stent to LAD 2016: Stable, denies chest pain.  Continue Toprol-XL and rosuvastatin.  History of SBO s/p SBR and ileocecectomy 2020: No evidence of bowel obstruction on CT.  Hypertension: Continuing Toprol-XL and diltiazem.  Hypothyroidism: Continue Synthroid.   DVT prophylaxis: SCDs Start: 07/17/22 2315 Code Status: DNR, confirmed with patient and son at bedside Family Communication: Son Dr. Tawanna Cooler Early at bedside Disposition Plan: From home, dispo pending clinical progress Consults called: General surgery Severity of Illness: The appropriate patient status for this patient is INPATIENT. Inpatient status is judged to  be reasonable and necessary in order to provide the required intensity of service to ensure the patient's safety. The patient's presenting symptoms, physical exam findings, and initial radiographic and laboratory data in the context of their chronic comorbidities is felt to place them at high risk for further clinical deterioration. Furthermore, it is not anticipated that the patient will be medically stable for discharge from the hospital within 2 midnights of admission.   * I certify that at the point of admission it is my clinical judgment that the patient will require inpatient hospital care spanning beyond 2 midnights from the point of admission due to high intensity of service, high risk for further deterioration and high frequency of surveillance required.Darreld Mclean MD [facility]  If 7PM-7AM, please contact night-coverage [email or web address]  07/17/2022, 11:43 PM

## 2022-07-17 NOTE — Progress Notes (Signed)
Pharmacy Antibiotic Note  Rachel Park is a 89 y.o. female admitted on 07/17/2022 with  intra-abdominal infection .  Pharmacy has been consulted for Zosyn dosing.  Presented with abdominal pain with cholecystitis. WBC elevated. CrCl 29 ml/min.  Plan: Start Zosyn 3.375 g every 8 hours  Height: 5\' 1"  (154.9 cm) Weight: 69 kg (152 lb 1.9 oz) IBW/kg (Calculated) : 47.8  Temp (24hrs), Avg:98.1 F (36.7 C), Min:97.7 F (36.5 C), Max:98.4 F (36.9 C)  Recent Labs  Lab 07/17/22 1900  WBC 12.7*  CREATININE 1.20*    Estimated Creatinine Clearance: 28.8 mL/min (A) (by C-G formula based on SCr of 1.2 mg/dL (H)).    Allergies  Allergen Reactions   Propofol Nausea And Vomiting    "with hysterectomy years back had post op N/V"   Simvastatin Other (See Comments)    Gi upset    Antimicrobials this admission: Zosyn 5/13 >>    Thank you for allowing pharmacy to be a part of this patient's care.  Rachel Park 07/17/2022 11:17 PM

## 2022-07-17 NOTE — ED Notes (Signed)
ED TO INPATIENT HANDOFF REPORT  ED Nurse Name and Phone #: Ines Bloomer [phone]   S Name/Age/Gender Rachel Park 89 y.o. female Room/Bed: 021C/021C  Code Status   Code Status: Prior  Home/SNF/Other Home Patient oriented to: self, place, time, and situation Is this baseline? Yes   Triage Complete: Triage complete  Chief Complaint Cholecystitis with cholelithiasis [K80.10]  Triage Note Pt BIBGEMS from home with c/o abdominal pain since 0900 this am. Hx bowel obstruction with RUQ pain. Pt still having bowel movements, with distention.   Small intestine and lg intestine removal 4 years ago  130/76 BP Hr 80s 24 rr 100% RA     Allergies Allergies  Allergen Reactions   Propofol Nausea And Vomiting    "with hysterectomy years back had post op N/V"   Simvastatin Other (See Comments)    Gi upset    Level of Care/Admitting Diagnosis ED Disposition     ED Disposition  Admit   Condition  --   Comment  Hospital [facility] [[id number]]  Level of Care: Telemetry Medical [[id number]]  May admit patient to [facility] or [facility] if equivalent level of care is available:: No  Covid Evaluation: Asymptomatic - no recent exposure (last 10 days) testing not required  Diagnosis: Cholecystitis with cholelithiasis [[id number]]  Admitting Physician: Charlsie Quest [[id number]]  Attending Physician: Charlsie Quest [[id number]]  Certification:: I certify this patient will need inpatient services for at least 2 midnights  Estimated Length of Stay: 3          B Medical/Surgery History Past Medical History:  Diagnosis Date   Arthritis    Complication of anesthesia    Coronary artery disease    Hypertension    Hypothyroidism    PAF (paroxysmal atrial fibrillation) (HCC)    on Xarelto for maybe a year, opted to stop   PONV (postoperative nausea and vomiting)    Renal infarction (HCC)    Small bowel obstruction (HCC) 06/2015   Stroke (HCC)    oct 2016   TIA  (transient ischemic attack) 03/24/2015   Vertigo, benign positional    to be evaluated, intermittent   Past Surgical History:  Procedure Laterality Date   ABDOMINAL HYSTERECTOMY     APPLICATION OF WOUND VAC N/A 07/23/2018   Procedure: APPLICATION OF WOUND VAC;  Surgeon: Emelia Loron, MD;  Location: [facility] OR;  Service: General;  Laterality: N/A;   BOWEL RESECTION N/A 07/23/2018   Procedure: SMALL BOWEL RESECTION;  Surgeon: Emelia Loron, MD;  Location: [facility] OR;  Service: General;  Laterality: N/A;   CARDIAC CATHETERIZATION N/A 12/24/2014   Procedure: Left Heart Cath and Coronary Angiography;  Surgeon: Tonny Bollman, MD;  Location: [facility] INVASIVE CV LAB;  Service: Cardiovascular;  Laterality: N/A;   CARDIOVERSION N/A 05/22/2018   Procedure: CARDIOVERSION;  Surgeon: Wendall Stade, MD;  Location: [facility] ENDOSCOPY;  Service: Cardiovascular;  Laterality: N/A;   CATARACT EXTRACTION Bilateral    COLONOSCOPY WITH PROPOFOL N/A 12/01/2013   Procedure: COLONOSCOPY WITH PROPOFOL;  Surgeon: Charolett Bumpers, MD;  Location: [facility] ENDOSCOPY;  Service: Endoscopy;  Laterality: N/A;   COMPLEX WOUND CLOSURE  07/25/2018   Procedure: Complex Wound Closure;  Surgeon: Emelia Loron, MD;  Location: [facility] OR;  Service: General;;   Ferne Coe  07/25/2018   Procedure: Ileocecetomy;  Surgeon: Emelia Loron, MD;  Location: [facility] OR;  Service: General;;   INCISIONAL HERNIA REPAIR N/A 03/10/2016   Procedure: INCISIONAL HERNIA REPAIR TIMES TWO;  Surgeon: Darnell Level,  MD;  Location: [facility] OR;  Service: General;  Laterality: N/A;   INGUINAL HERNIA REPAIR Bilateral 03/10/2016   Procedure: BILATERAL INGUINAL HERNIA REPAIRS;  Surgeon: Darnell Level, MD;  Location: [facility] OR;  Service: General;  Laterality: Bilateral;   INSERTION OF MESH N/A 03/10/2016   Procedure: INSERTION OF MESH TO BILATERAL GROINS AND ABDOMEN;  Surgeon: Darnell Level, MD;  Location: [facility] OR;  Service: General;  Laterality: N/A;   laparotomy  06/2015   LAPAROTOMY N/A  07/23/2018   Procedure: EXPLORATORY LAPAROTOMY;  Surgeon: Emelia Loron, MD;  Location: [facility] OR;  Service: General;  Laterality: N/A;   LEFT HEART CATH AND CORONARY ANGIOGRAPHY N/A 09/27/2016   Procedure: Left Heart Cath and Coronary Angiography;  Surgeon: Tonny Bollman, MD;  Location[facility] INVASIVE CV LAB;  Service: Cardiovascular;  Laterality: N/A;   TONSILLECTOMY       A IV Location/Drains/Wounds Patient Lines/Drains/Airways Status     Active Line/Drains/Airways     Name Placement date Placement time Site Days   Peripheral IV 07/17/22 18 G Left Antecubital 07/17/22  1911  Antecubital  less than 1   Incision (Closed) 07/23/18 Abdomen Other (Comment) 07/23/18  1200  -- 1455            Intake/Output Last 24 hours  Intake/Output Summary (Last 24 hours) at 07/17/2022 2306 Last data filed at 07/17/2022 2138 Gross per 24 hour  Intake 1000 ml  Output --  Net 1000 ml    Labs/Imaging Results for orders placed or performed during the hospital encounter of 07/17/22 (from the past 48 hour(s))  CBC with Differential     Status: Abnormal   Collection Time: 07/17/22  7:00 PM  Result Value Ref Range   WBC 12.7 (H) 4.0 - 10.5 K/uL   RBC 4.30 3.87 - 5.11 MIL/uL   Hemoglobin 13.6 12.0 - 15.0 g/dL   HCT 60.4 54.0 - 98.1 %   MCV 99.1 80.0 - 100.0 fL   MCH 31.6 26.0 - 34.0 pg   MCHC 31.9 30.0 - 36.0 g/dL   RDW 19.1 47.8 - 29.5 %   Platelets 231 150 - 400 K/uL   nRBC 0.0 0.0 - 0.2 %   Neutrophils Relative % 82 %   Neutro Abs 10.3 (H) 1.7 - 7.7 K/uL   Lymphocytes Relative 11 %   Lymphs Abs 1.4 0.7 - 4.0 K/uL   Monocytes Relative 6 %   Monocytes Absolute 0.8 0.1 - 1.0 K/uL   Eosinophils Relative 0 %   Eosinophils Absolute 0.0 0.0 - 0.5 K/uL   Basophils Relative 0 %   Basophils Absolute 0.1 0.0 - 0.1 K/uL   Immature Granulocytes 1 %   Abs Immature Granulocytes 0.13 (H) 0.00 - 0.07 K/uL    Comment: Performed at [facility], [address], [place], [place] [address]   Comprehensive metabolic panel     Status: Abnormal   Collection Time: 07/17/22  7:00 PM  Result Value Ref Range   Sodium 132 (L) 135 - 145 mmol/L   Potassium 5.1 3.5 - 5.1 mmol/L   Chloride 100 98 - 111 mmol/L   CO2 15 (L) 22 - 32 mmol/L   Glucose, Bld 123 (H) 70 - 99 mg/dL    Comment: Glucose reference range applies only to samples taken after fasting for at least 8 hours.   BUN 25 (H) 8 - 23 mg/dL   Creatinine, Ser 8.65 (H) 0.44 - 1.00 mg/dL   Calcium 78.4 8.9 - 69.6 mg/dL  Total Protein 6.8 6.5 - 8.1 g/dL   Albumin 4.3 3.5 - 5.0 g/dL   AST 161 (H) 15 - 41 U/L   ALT 71 (H) 0 - 44 U/L   Alkaline Phosphatase 68 38 - 126 U/L   Total Bilirubin 0.9 0.3 - 1.2 mg/dL   GFR, Estimated 44 (L) >60 mL/min    Comment: (NOTE) Calculated using the CKD-EPI Creatinine Equation (2021)    Anion gap 17 (H) 5 - 15    Comment: Performed at [facility], [address], [place], [place] [address]  Lipase, blood     Status: None   Collection Time: 07/17/22  7:00 PM  Result Value Ref Range   Lipase 30 11 - 51 U/L    Comment: Performed at [facility], [address], [place], [place] [address]  Urinalysis, Routine w reflex microscopic -Urine, Clean Catch     Status: Abnormal   Collection Time: 07/17/22  9:50 PM  Result Value Ref Range   Color, Urine YELLOW YELLOW   APPearance CLEAR CLEAR   Specific Gravity, Urine 1.035 (H) 1.005 - 1.030   pH 5.0 5.0 - 8.0   Glucose, UA NEGATIVE NEGATIVE mg/dL   Hgb urine dipstick NEGATIVE NEGATIVE   Bilirubin Urine NEGATIVE NEGATIVE   Ketones, ur 5 (A) NEGATIVE mg/dL   Protein, ur 30 (A) NEGATIVE mg/dL   Nitrite NEGATIVE NEGATIVE   Leukocytes,Ua NEGATIVE NEGATIVE   RBC / HPF 0-5 0 - 5 RBC/hpf   WBC, UA 0-5 0 - 5 WBC/hpf   Bacteria, UA NONE SEEN NONE SEEN   Squamous Epithelial / HPF 0-5 0 - 5 /HPF   Mucus PRESENT    Hyaline Casts, UA PRESENT     Comment: Performed at [facility], [address] [place], [place] [address]   CT ABDOMEN  PELVIS W CONTRAST  Result Date: 07/17/2022 CLINICAL DATA:  Suspected bowel obstruction EXAM: CT ABDOMEN AND PELVIS WITH CONTRAST TECHNIQUE: Multidetector CT imaging of the abdomen and pelvis was performed using the standard protocol following bolus administration of intravenous contrast. RADIATION DOSE REDUCTION: This exam was performed according to the departmental dose-optimization program which includes automated exposure control, adjustment of the mA and/or kV according to patient size and/or use of iterative reconstruction technique. CONTRAST:  75mL OMNIPAQUE IOHEXOL 350 MG/ML SOLN COMPARISON:  CT 08/02/2018, 07/22/2018 FINDINGS: Lower chest: Lung bases demonstrate no acute airspace disease. Cardiomegaly. Trace right-sided pleural effusion. Hepatobiliary: No focal hepatic abnormality. Multiple gallstones. Abnormal fluid adjacent to the gallbladder either representing Peri coli cystic fluid versus marked gallbladder wall thickening. No biliary dilatation Pancreas: Unremarkable. No pancreatic ductal dilatation or surrounding inflammatory changes. Spleen: Normal in size without focal abnormality. Adrenals/Urinary Tract: Adrenal glands are within normal limits. Kidneys show no hydronephrosis. Cortical scarring mid pole right kidney. The bladder is unremarkable. Stomach/Bowel: The stomach is nonenlarged. Status post small bowel resection with ileo colic anastomosis in the right lower quadrant. Mild fecalized appearance of small bowel in the region probably due to slow transit. No convincing obstruction. No acute bowel wall thickening. Diverticular disease of the left colon. Vascular/Lymphatic: Mild aortic atherosclerosis. No aneurysm. No suspicious lymph nodes Reproductive: Status post hysterectomy. No adnexal masses. Other: No free air. Small volume free fluid within the abdomen and pelvis. Anterior abdominal wall laxity. Small inferior ventral hernia containing mesenteric fat, small ascites as well as bowel but  no obstructive change. Musculoskeletal: Chronic compression deformity at L1 IMPRESSION: 1. Multiple gallstones. Abnormal fluid adjacent  to the gallbladder either representing pericholecystic fluid versus marked gallbladder wall thickening. Recommend correlation with right upper quadrant ultrasound. 2. Status post small bowel resection with ileocolic anastomosis in the right lower quadrant. Mild fecalized appearance of small bowel in the region probably due to slow transit. No convincing evidence for bowel obstruction. 3. Small volume free fluid within the abdomen and pelvis. Diverticular disease of the left colon without acute wall thickening. 4. Cardiomegaly. Trace right-sided pleural effusion. 5. Small inferior ventral hernia containing mesenteric fat, small ascites as well as bowel but no obstructive change. Aortic Atherosclerosis (ICD10-I70.0). Electronically Signed   By: Jasmine Pang M.D.   On: 07/17/2022 19:56    Pending Labs Unresulted Labs (From admission, onward)     Start     Ordered   07/17/22 2255  Lactic acid, plasma  ONCE - STAT,   STAT        07/17/22 2254   07/17/22 1940  Blood culture (routine x 2)  BLOOD CULTURE X 2,   R (with STAT occurrences)      07/17/22 1939   07/17/22 1936  Lactic acid, plasma  Add-on,   AD        07/17/22 1935            Vitals/Pain Today's Vitals   07/17/22 2024 07/17/22 2138 07/17/22 2145 07/17/22 2259  BP: (!) 149/77  123/84 (!) 140/84  Pulse: (!) 28  90 84  Resp: (!) 27  (!) 26 (!) 24  Temp:    97.7 F (36.5 C)  TempSrc:    Oral  SpO2: 96%  94% 96%  Weight:      Height:      PainSc: 7  6  6  6      Isolation Precautions No active isolations  Medications Medications  piperacillin-tazobactam (ZOSYN) IVPB 3.375 g (3.375 g Intravenous New Bag/Given 07/17/22 2023)  fentaNYL (SUBLIMAZE) injection 50 mcg (has no administration in time range)  fentaNYL (SUBLIMAZE) injection 50 mcg (50 mcg Intravenous Given 07/17/22 1917)  ondansetron  (ZOFRAN) injection 4 mg (4 mg Intravenous Given 07/17/22 1917)  lactated ringers bolus 1,000 mL (0 mLs Intravenous Stopped 07/17/22 2138)  iohexol (OMNIPAQUE) 350 MG/ML injection 75 mL (75 mLs Intravenous Contrast Given 07/17/22 1921)  fentaNYL (SUBLIMAZE) injection 50 mcg (50 mcg Intravenous Given 07/17/22 1950)    Mobility walks     Focused Assessments See chart   R Recommendations: See Admitting Provider Note  Report given to:   Additional Notes:

## 2022-07-17 NOTE — Progress Notes (Signed)
Pharmacy Antibiotic Note  Rachel Park is a 89 y.o. female admitted on 07/17/2022 with abdominal pain. She has a complex PMHx S/P SBR, ileocecectomy with ABThera and takeback by Dr. Dwain Sarna in 2020. She developed abdominal pain at 9am. She was brought in by EMS. W/U shows cholecystitis and I was asked to see for surgical managemen  Pharmacy has been consulted for zosyn dosing.  Plan: Zosyn 3.375g IV q8 hours (4 hr infusion)  Height: 5\' 1"  (154.9 cm) Weight: 69 kg (152 lb 1.9 oz) IBW/kg (Calculated) : 47.8  Temp (24hrs), Avg:98.1 F (36.7 C), Min:97.7 F (36.5 C), Max:98.4 F (36.9 C)  Recent Labs  Lab 07/17/22 1900  WBC 12.7*  CREATININE 1.20*    Estimated Creatinine Clearance: 28.8 mL/min (A) (by C-G formula based on SCr of 1.2 mg/dL (H)).    Allergies  Allergen Reactions   Propofol Nausea And Vomiting    "with hysterectomy years back had post op N/V"   Simvastatin Other (See Comments)    Gi upset    Thank you for allowing pharmacy to be a part of this patient's care.  Toniann Fail Ikaika Showers 07/17/2022 11:17 PM

## 2022-07-17 NOTE — Hospital Course (Signed)
Rachel Park is a 89 y.o. female with medical history significant for persistent A-fib on Eliquis, CAD s/p stent to LAD 2016, history of CVA, HTN, hypothyroidism, SBO s/p SBR and ileocecectomy with ABThera and takeback by Dr. Dwain Sarna in 2020 who is admitted with cholecystitis.

## 2022-07-17 NOTE — Consult Note (Signed)
Reason for Consult:cholecystitis Referring Physician: [facility]  Rachel Park is an 89 y.o. female.  HPI: 89yo F with complex PMHx as below, known to our service S/P SBR, ileocecectomy with ABThera and takeback by Dr. Dwain Sarna in 2020. She developed abdominal pain at 9am. She was brought in by EMS. W/U shows cholecystitis and I was asked to see for surgical management. Her son, Dr. Arbie Cookey, is at the bedside.  Past Medical History:  Diagnosis Date   Arthritis    Complication of anesthesia    Coronary artery disease    Hypertension    Hypothyroidism    PAF (paroxysmal atrial fibrillation) (HCC)    on Xarelto for maybe a year, opted to stop   PONV (postoperative nausea and vomiting)    Renal infarction (HCC)    Small bowel obstruction (HCC) 06/2015   Stroke (HCC)    oct 2016   TIA (transient ischemic attack) 03/24/2015   Vertigo, benign positional    to be evaluated, intermittent    Past Surgical History:  Procedure Laterality Date   ABDOMINAL HYSTERECTOMY     APPLICATION OF WOUND VAC N/A 07/23/2018   Procedure: APPLICATION OF WOUND VAC;  Surgeon: Emelia Loron, MD;  Location: [facility] OR;  Service: General;  Laterality: N/A;   BOWEL RESECTION N/A 07/23/2018   Procedure: SMALL BOWEL RESECTION;  Surgeon: Emelia Loron, MD;  Location: [facility] OR;  Service: General;  Laterality: N/A;   CARDIAC CATHETERIZATION N/A 12/24/2014   Procedure: Left Heart Cath and Coronary Angiography;  Surgeon: Tonny Bollman, MD;  Location: [facility] INVASIVE CV LAB;  Service: Cardiovascular;  Laterality: N/A;   CARDIOVERSION N/A 05/22/2018   Procedure: CARDIOVERSION;  Surgeon: Wendall Stade, MD;  Location: [facility] ENDOSCOPY;  Service: Cardiovascular;  Laterality: N/A;   CATARACT EXTRACTION Bilateral    COLONOSCOPY WITH PROPOFOL N/A 12/01/2013   Procedure: COLONOSCOPY WITH PROPOFOL;  Surgeon: Charolett Bumpers, MD;  Location: [facility] ENDOSCOPY;  Service: Endoscopy;  Laterality: N/A;   COMPLEX WOUND CLOSURE  07/25/2018    Procedure: Complex Wound Closure;  Surgeon: Emelia Loron, MD;  Location: [facility] OR;  Service: General;;   Ferne Coe  07/25/2018   Procedure: Ileocecetomy;  Surgeon: Emelia Loron, MD;  Location: [facility] OR;  Service: General;;   INCISIONAL HERNIA REPAIR N/A 03/10/2016   Procedure: INCISIONAL HERNIA REPAIR TIMES TWO;  Surgeon: Darnell Level, MD;  Location: [facility] OR;  Service: General;  Laterality: N/A;   INGUINAL HERNIA REPAIR Bilateral 03/10/2016   Procedure: BILATERAL INGUINAL HERNIA REPAIRS;  Surgeon: Darnell Level, MD;  Location: [facility] OR;  Service: General;  Laterality: Bilateral;   INSERTION OF MESH N/A 03/10/2016   Procedure: INSERTION OF MESH TO BILATERAL GROINS AND ABDOMEN;  Surgeon: Darnell Level, MD;  Location: [facility] OR;  Service: General;  Laterality: N/A;   laparotomy  06/2015   LAPAROTOMY N/A 07/23/2018   Procedure: EXPLORATORY LAPAROTOMY;  Surgeon: Emelia Loron, MD;  Location: [facility] OR;  Service: General;  Laterality: N/A;   LEFT HEART CATH AND CORONARY ANGIOGRAPHY N/A 09/27/2016   Procedure: Left Heart Cath and Coronary Angiography;  Surgeon: Tonny Bollman, MD;  Location: [facility] INVASIVE CV LAB;  Service: Cardiovascular;  Laterality: N/A;   TONSILLECTOMY      Family History  Problem Relation Age of Onset   Heart attack Father 26       Died age 52   Stroke Mother    Cancer Sister        breast    Social History:  reports that she has  never smoked. She has never used smokeless tobacco. She reports that she does not currently use alcohol. She reports that she does not use drugs.  Allergies:  Allergies  Allergen Reactions   Propofol Nausea And Vomiting    "with hysterectomy years back had post op N/V"   Other Other (See Comments)    Medications: I have reviewed the patient's current medications.  Results for orders placed or performed during the hospital encounter of 07/17/22 (from the past 48 hour(s))  CBC with Differential     Status: Abnormal   Collection Time: 07/17/22  7:00 PM   Result Value Ref Range   WBC 12.7 (H) 4.0 - 10.5 K/uL   RBC 4.30 3.87 - 5.11 MIL/uL   Hemoglobin 13.6 12.0 - 15.0 g/dL   HCT 16.1 09.6 - 04.5 %   MCV 99.1 80.0 - 100.0 fL   MCH 31.6 26.0 - 34.0 pg   MCHC 31.9 30.0 - 36.0 g/dL   RDW 40.9 81.1 - 91.4 %   Platelets 231 150 - 400 K/uL   nRBC 0.0 0.0 - 0.2 %   Neutrophils Relative % 82 %   Neutro Abs 10.3 (H) 1.7 - 7.7 K/uL   Lymphocytes Relative 11 %   Lymphs Abs 1.4 0.7 - 4.0 K/uL   Monocytes Relative 6 %   Monocytes Absolute 0.8 0.1 - 1.0 K/uL   Eosinophils Relative 0 %   Eosinophils Absolute 0.0 0.0 - 0.5 K/uL   Basophils Relative 0 %   Basophils Absolute 0.1 0.0 - 0.1 K/uL   Immature Granulocytes 1 %   Abs Immature Granulocytes 0.13 (H) 0.00 - 0.07 K/uL    Comment: Performed at [facility], [address], [place], [place] [address]  Comprehensive metabolic panel     Status: Abnormal   Collection Time: 07/17/22  7:00 PM  Result Value Ref Range   Sodium 132 (L) 135 - 145 mmol/L   Potassium 5.1 3.5 - 5.1 mmol/L   Chloride 100 98 - 111 mmol/L   CO2 15 (L) 22 - 32 mmol/L   Glucose, Bld 123 (H) 70 - 99 mg/dL    Comment: Glucose reference range applies only to samples taken after fasting for at least 8 hours.   BUN 25 (H) 8 - 23 mg/dL   Creatinine, Ser 6.21 (H) 0.44 - 1.00 mg/dL   Calcium 30.8 8.9 - 65.7 mg/dL   Total Protein 6.8 6.5 - 8.1 g/dL   Albumin 4.3 3.5 - 5.0 g/dL   AST 846 (H) 15 - 41 U/L   ALT 71 (H) 0 - 44 U/L   Alkaline Phosphatase 68 38 - 126 U/L   Total Bilirubin 0.9 0.3 - 1.2 mg/dL   GFR, Estimated 44 (L) >60 mL/min    Comment: (NOTE) Calculated using the CKD-EPI Creatinine Equation (2021)    Anion gap 17 (H) 5 - 15    Comment: Performed at [facility], [address] [place], [place] [address]  Lipase, blood     Status: None   Collection Time: 07/17/22  7:00 PM  Result Value Ref Range   Lipase 30 11 - 51 U/L    Comment: Performed at [facility], [address], [place],  [place] [address]    CT ABDOMEN PELVIS W CONTRAST  Result Date: 07/17/2022 CLINICAL DATA:  Suspected bowel obstruction EXAM: CT ABDOMEN AND PELVIS WITH CONTRAST TECHNIQUE: Multidetector CT imaging of the abdomen and pelvis was performed using the standard  protocol following bolus administration of intravenous contrast. RADIATION DOSE REDUCTION: This exam was performed according to the departmental dose-optimization program which includes automated exposure control, adjustment of the mA and/or kV according to patient size and/or use of iterative reconstruction technique. CONTRAST:  75mL OMNIPAQUE IOHEXOL 350 MG/ML SOLN COMPARISON:  CT 08/02/2018, 07/22/2018 FINDINGS: Lower chest: Lung bases demonstrate no acute airspace disease. Cardiomegaly. Trace right-sided pleural effusion. Hepatobiliary: No focal hepatic abnormality. Multiple gallstones. Abnormal fluid adjacent to the gallbladder either representing Peri coli cystic fluid versus marked gallbladder wall thickening. No biliary dilatation Pancreas: Unremarkable. No pancreatic ductal dilatation or surrounding inflammatory changes. Spleen: Normal in size without focal abnormality. Adrenals/Urinary Tract: Adrenal glands are within normal limits. Kidneys show no hydronephrosis. Cortical scarring mid pole right kidney. The bladder is unremarkable. Stomach/Bowel: The stomach is nonenlarged. Status post small bowel resection with ileo colic anastomosis in the right lower quadrant. Mild fecalized appearance of small bowel in the region probably due to slow transit. No convincing obstruction. No acute bowel wall thickening. Diverticular disease of the left colon. Vascular/Lymphatic: Mild aortic atherosclerosis. No aneurysm. No suspicious lymph nodes Reproductive: Status post hysterectomy. No adnexal masses. Other: No free air. Small volume free fluid within the abdomen and pelvis. Anterior abdominal wall laxity. Small inferior ventral hernia containing mesenteric fat, small  ascites as well as bowel but no obstructive change. Musculoskeletal: Chronic compression deformity at L1 IMPRESSION: 1. Multiple gallstones. Abnormal fluid adjacent to the gallbladder either representing pericholecystic fluid versus marked gallbladder wall thickening. Recommend correlation with right upper quadrant ultrasound. 2. Status post small bowel resection with ileocolic anastomosis in the right lower quadrant. Mild fecalized appearance of small bowel in the region probably due to slow transit. No convincing evidence for bowel obstruction. 3. Small volume free fluid within the abdomen and pelvis. Diverticular disease of the left colon without acute wall thickening. 4. Cardiomegaly. Trace right-sided pleural effusion. 5. Small inferior ventral hernia containing mesenteric fat, small ascites as well as bowel but no obstructive change. Aortic Atherosclerosis (ICD10-I70.0). Electronically Signed   By: Jasmine Pang M.D.   On: 07/17/2022 19:56    Review of Systems  Constitutional:  Positive for appetite change.  HENT: Negative.    Eyes: Negative.   Respiratory: Negative.    Cardiovascular: Negative.   Gastrointestinal:  Positive for abdominal pain and nausea.  Endocrine: Negative.   Genitourinary: Negative.   Musculoskeletal: Negative.   Allergic/Immunologic: Negative.   Neurological: Negative.   Hematological: Negative.   Psychiatric/Behavioral: Negative.     Blood pressure (!) 149/77, pulse (!) 28, temperature 98.4 F (36.9 C), temperature source Oral, resp. rate (!) 27, height 5\' 1"  (1.549 m), weight 69 kg, SpO2 96 %. Physical Exam HENT:     Mouth/Throat:     Mouth: Mucous membranes are moist.  Cardiovascular:     Rate and Rhythm: Normal rate. Rhythm irregular.     Pulses: Normal pulses.  Pulmonary:     Effort: Pulmonary effort is normal.     Breath sounds: No wheezing or rhonchi.  Abdominal:     Tenderness: There is abdominal tenderness. There is no guarding or rebound.      Comments: Surgical scars, tender RUQ  Musculoskeletal:        General: No tenderness.     Comments: Some chronic venous stasis changes BLE  Skin:    General: Skin is warm.  Neurological:     Mental Status: She is alert and oriented to person, place, and time.  Psychiatric:  Mood and Affect: Mood normal.     Assessment/Plan: Cholecystitis with cholelithiasis - medical admission, IV Zosyn has been started, NPO except sips with meds, will further discuss percutaneous cholecystostomy tube placement vs cholecystectomy with Dr. Corliss Skains. Please hold Eliquis.  I discussed the plan at length with her son.  Liz Malady 07/17/2022, 8:34 PM

## 2022-07-17 NOTE — ED Notes (Signed)
Pt belongings that stayed with pt were eyeglasses and case, cell phone, cellphone charger, chapstick and pants. Son, Tawanna Cooler, taking remaining belongings

## 2022-07-17 NOTE — ED Triage Notes (Signed)
Pt BIBGEMS from home with c/o abdominal pain since 0900 this am. Hx bowel obstruction with RUQ pain. Pt still having bowel movements, with distention.   Small intestine and lg intestine removal 4 years ago  130/76 BP Hr 80s 24 rr 100% RA

## 2022-07-17 NOTE — ED Notes (Signed)
Admitting provider, Dr Allena Katz, at bedside

## 2022-07-18 ENCOUNTER — Encounter (HOSPITAL_COMMUNITY): Payer: Self-pay | Admitting: Internal Medicine

## 2022-07-18 ENCOUNTER — Inpatient Hospital Stay (HOSPITAL_COMMUNITY): Payer: Medicare HMO

## 2022-07-18 DIAGNOSIS — E039 Hypothyroidism, unspecified: Secondary | ICD-10-CM | POA: Diagnosis not present

## 2022-07-18 DIAGNOSIS — N179 Acute kidney failure, unspecified: Secondary | ICD-10-CM

## 2022-07-18 DIAGNOSIS — I25119 Atherosclerotic heart disease of native coronary artery with unspecified angina pectoris: Secondary | ICD-10-CM

## 2022-07-18 DIAGNOSIS — K8000 Calculus of gallbladder with acute cholecystitis without obstruction: Secondary | ICD-10-CM | POA: Diagnosis not present

## 2022-07-18 HISTORY — PX: IR PERC CHOLECYSTOSTOMY: IMG2326

## 2022-07-18 LAB — COMPREHENSIVE METABOLIC PANEL
ALT: 59 U/L — ABNORMAL HIGH (ref 0–44)
AST: 83 U/L — ABNORMAL HIGH (ref 15–41)
Albumin: 3.7 g/dL (ref 3.5–5.0)
Alkaline Phosphatase: 65 U/L (ref 38–126)
Anion gap: 12 (ref 5–15)
BUN: 22 mg/dL (ref 8–23)
CO2: 18 mmol/L — ABNORMAL LOW (ref 22–32)
Calcium: 9.1 mg/dL (ref 8.9–10.3)
Chloride: 100 mmol/L (ref 98–111)
Creatinine, Ser: 1.08 mg/dL — ABNORMAL HIGH (ref 0.44–1.00)
GFR, Estimated: 49 mL/min — ABNORMAL LOW (ref >60)
Glucose, Bld: 109 mg/dL — ABNORMAL HIGH (ref 70–99)
Potassium: 4.6 mmol/L (ref 3.5–5.1)
Sodium: 130 mmol/L — ABNORMAL LOW (ref 135–145)
Total Bilirubin: 1.3 mg/dL — ABNORMAL HIGH (ref 0.3–1.2)
Total Protein: 6.2 g/dL — ABNORMAL LOW (ref 6.5–8.1)

## 2022-07-18 LAB — CBC
HCT: 36.8 % (ref 36.0–46.0)
Hemoglobin: 12.3 g/dL (ref 12.0–15.0)
MCH: 31 pg (ref 26.0–34.0)
MCHC: 33.4 g/dL (ref 30.0–36.0)
MCV: 92.7 fL (ref 80.0–100.0)
Platelets: 210 10*3/uL (ref 150–400)
RBC: 3.97 MIL/uL (ref 3.87–5.11)
RDW: 13.2 % (ref 11.5–15.5)
WBC: 8.9 10*3/uL (ref 4.0–10.5)
nRBC: 0 % (ref 0.0–0.2)

## 2022-07-18 LAB — CULTURE, BLOOD (ROUTINE X 2): Culture: NO GROWTH

## 2022-07-18 LAB — LACTIC ACID, PLASMA: Lactic Acid, Venous: 1.5 mmol/L (ref 0.5–1.9)

## 2022-07-18 LAB — AEROBIC/ANAEROBIC CULTURE W GRAM STAIN (SURGICAL/DEEP WOUND)

## 2022-07-18 LAB — PROTIME-INR
INR: 1.2 (ref 0.8–1.2)
Prothrombin Time: 15.3 seconds — ABNORMAL HIGH (ref 11.4–15.2)

## 2022-07-18 LAB — CK: Total CK: 374 U/L — ABNORMAL HIGH (ref 38–234)

## 2022-07-18 MED ORDER — FENTANYL CITRATE (PF) 100 MCG/2ML IJ SOLN
INTRAMUSCULAR | Status: AC
  Filled 2022-07-18: qty 2

## 2022-07-18 MED ORDER — MIDAZOLAM HCL 2 MG/2ML IJ SOLN
INTRAMUSCULAR | Status: AC | PRN
  Administered 2022-07-18 (×3): .5 mg via INTRAVENOUS

## 2022-07-18 MED ORDER — LIDOCAINE HCL 1 % IJ SOLN
INTRAMUSCULAR | Status: AC
  Filled 2022-07-18: qty 20

## 2022-07-18 MED ORDER — FENTANYL CITRATE (PF) 100 MCG/2ML IJ SOLN
INTRAMUSCULAR | Status: AC | PRN
  Administered 2022-07-18: 50 ug via INTRAVENOUS
  Administered 2022-07-18: 25 ug via INTRAVENOUS

## 2022-07-18 MED ORDER — IOHEXOL 300 MG/ML  SOLN: 50.0000 mL | Freq: Once | INTRAMUSCULAR | Status: DC | PRN

## 2022-07-18 MED ORDER — MIDAZOLAM HCL 2 MG/2ML IJ SOLN
INTRAMUSCULAR | Status: AC
  Filled 2022-07-18: qty 2

## 2022-07-18 MED ORDER — SODIUM CHLORIDE 0.9 % IV SOLN: INTRAVENOUS | Status: DC

## 2022-07-18 NOTE — Consult Note (Signed)
Chief Complaint: Patient was seen in consultation today for acute calculous cholecystitis  Referring Physician(s): Leary Roca, PA-C  Supervising Physician: Gilmer Mor  Patient Status: [facility] - In-pt  History of Present Illness: Rachel Park is a 89 y.o. female with PMH including  coronary artery disease, hypertension, hypothyroidism, paroxysmal atrial fibrillation, PONV, history of small bowel obstruction, and history of stroke in 2016 being seen today in relation to acute cholecystitis. Patient presented to [facility] ED on 07/17/22 with abdominal pain. Subsequent CT imaging revealed presence of gallstones and concern for acute cholecystitis. Patient was evaluated by surgical team and felt to be a candidate for percutaneous cholecystostomy and IR team was consulted.  Past Medical History:  Diagnosis Date   Arthritis    Complication of anesthesia    Coronary artery disease    Hypertension    Hypothyroidism    PAF (paroxysmal atrial fibrillation) (HCC)    on Xarelto for maybe a year, opted to stop   PONV (postoperative nausea and vomiting)    Renal infarction (HCC)    Small bowel obstruction (HCC) 06/2015   Stroke (HCC)    oct 2016   TIA (transient ischemic attack) 03/24/2015   Vertigo, benign positional    to be evaluated, intermittent    Past Surgical History:  Procedure Laterality Date   ABDOMINAL HYSTERECTOMY     APPLICATION OF WOUND VAC N/A 07/23/2018   Procedure: APPLICATION OF WOUND VAC;  Surgeon: Emelia Loron, MD;  Location: [facility] OR;  Service: General;  Laterality: N/A;   BOWEL RESECTION N/A 07/23/2018   Procedure: SMALL BOWEL RESECTION;  Surgeon: Emelia Loron, MD;  Location: [facility] OR;  Service: General;  Laterality: N/A;   CARDIAC CATHETERIZATION N/A 12/24/2014   Procedure: Left Heart Cath and Coronary Angiography;  Surgeon: Tonny Bollman, MD;  Location: [facility] INVASIVE CV LAB;  Service: Cardiovascular;  Laterality: N/A;   CARDIOVERSION N/A 05/22/2018   Procedure:  CARDIOVERSION;  Surgeon: Wendall Stade, MD;  Location: [facility] ENDOSCOPY;  Service: Cardiovascular;  Laterality: N/A;   CATARACT EXTRACTION Bilateral    COLONOSCOPY WITH PROPOFOL N/A 12/01/2013   Procedure: COLONOSCOPY WITH PROPOFOL;  Surgeon: Charolett Bumpers, MD;  Location: [facility] ENDOSCOPY;  Service: Endoscopy;  Laterality: N/A;   COMPLEX WOUND CLOSURE  07/25/2018   Procedure: Complex Wound Closure;  Surgeon: Emelia Loron, MD;  Location: [facility] OR;  Service: General;;   Ferne Coe  07/25/2018   Procedure: Ileocecetomy;  Surgeon: Emelia Loron, MD;  Location: [facility] OR;  Service: General;;   INCISIONAL HERNIA REPAIR N/A 03/10/2016   Procedure: INCISIONAL HERNIA REPAIR TIMES TWO;  Surgeon: Darnell Level, MD;  Location: [place] OR;  Service: General;  Laterality: N/A;   INGUINAL HERNIA REPAIR Bilateral 03/10/2016   Procedure: BILATERAL INGUINAL HERNIA REPAIRS;  Surgeon: Darnell Level, MD;  Location: [facility] OR;  Service: General;  Laterality: Bilateral;   INSERTION OF MESH N/A 03/10/2016   Procedure: INSERTION OF MESH TO BILATERAL GROINS AND ABDOMEN;  Surgeon: Darnell Level, MD;  Location: [facility] OR;  Service: General;  Laterality: N/A;   laparotomy  06/2015   LAPAROTOMY N/A 07/23/2018   Procedure: EXPLORATORY LAPAROTOMY;  Surgeon: Emelia Loron, MD;  Location: [facility] OR;  Service: General;  Laterality: N/A;   LEFT HEART CATH AND CORONARY ANGIOGRAPHY N/A 09/27/2016   Procedure: Left Heart Cath and Coronary Angiography;  Surgeon: Tonny Bollman, MD;  Location: [facility] INVASIVE CV LAB;  Service: Cardiovascular;  Laterality: N/A;   TONSILLECTOMY      Allergies: Propofol and Simvastatin  Medications:  Prior to Admission medications   Medication Sig Start Date End Date Taking? Authorizing Provider  acetaminophen (TYLENOL) 500 MG tablet Take 1,000 mg by mouth every 6 (six) hours as needed for mild pain.    Yes [provider]  alendronate (FOSAMAX) 70 MG tablet Take 70 mg by mouth once a week. Saturday 11/22/20  Yes  [provider]  apixaban (ELIQUIS) 2.5 MG TABS tablet Take 1 tablet (2.5 mg total) by mouth 2 (two) times daily. 08/25/19  Yes Marjie Skiff E, PA-C  Calcium Carb-Cholecalciferol (CALCIUM + D3) 600-800 MG-UNIT TABS Take 1 tablet by mouth daily.   Yes [provider]  Cholecalciferol (VITAMIN D3) 50 MCG (2000 UT) TABS Take 2,000 Units by mouth daily with lunch.   Yes [provider]  diltiazem (TIAZAC) 300 MG 24 hr capsule Take 300 mg by mouth daily. 05/08/22  Yes [provider]  famotidine (PEPCID) 10 MG tablet Take 10 mg by mouth daily as needed for heartburn.   Yes [provider]  levothyroxine (SYNTHROID) 88 MCG tablet Take 88 mcg by mouth every morning. 10/30/19  Yes [provider]  metoprolol succinate (TOPROL-XL) 100 MG 24 hr tablet Take 1 tablet (100 mg total) by mouth 2 (two) times daily. Take with or immediately following a meal. 12/27/20  Yes Nahser, Deloris Ping, MD  MYRBETRIQ 50 MG TB24 tablet Take 50 mg by mouth daily. 05/05/22  Yes [provider]  NITROSTAT 0.4 MG SL tablet Place 1 tablet (0.4 mg total) under the tongue every 5 (five) minutes as needed. Chest pain 06/24/18  Yes Nahser, Deloris Ping, MD  Polyethyl Glycol-Propyl Glycol 0.4-0.3 % SOLN Place 1-2 drops into both eyes daily as needed (for dry eye).   Yes [provider]  rosuvastatin (CRESTOR) 10 MG tablet Take 1 tablet by mouth once daily Patient taking differently: Take 10 mg by mouth daily. 12/26/21  Yes Nahser, Deloris Ping, MD  spironolactone (ALDACTONE) 25 MG tablet Take 1 tablet by mouth once daily Patient taking differently: Take 25 mg by mouth daily. 09/13/21  Yes Bhagat, Sharrell Ku, PA     Family History  Problem Relation Age of Onset   Heart attack Father 36       Died age 35   Stroke Mother    Cancer Sister        breast    Social History   Socioeconomic History   Marital status: Divorced    Spouse name: Not on file   Number of children: 2    Years of education: HS   Highest education level: Not on file  Occupational History   Occupation: retired  Tobacco Use   Smoking status: Never   Smokeless tobacco: Never  Vaping Use   Vaping Use: Never used  Substance and Sexual Activity   Alcohol use: Not Currently    Comment: occ.   Drug use: No   Sexual activity: Not on file  Other Topics Concern   Not on file  Social History Narrative   Lives alone.  Widow.  Two children   Social Determinants of Health   Financial Resource Strain: Not on file  Food Insecurity: No Food Insecurity (07/17/2022)   Hunger Vital Sign    Worried About Running Out of Food in the Last Year: Never true    Ran Out of Food in the Last Year: Never true  Transportation Needs: No Transportation Needs (07/17/2022)   PRAPARE - Administrator, Civil Service (Medical): No  Lack of Transportation (Non-Medical): No  Physical Activity: Not on file  Stress: Not on file  Social Connections: Not on file    Patient currently has DNR order in place. Discussion with the patient and family regarding wishes.  The original DNR order is maintained and prior treatment limitations are upheld.   Review of Systems: A 12 point ROS discussed and pertinent positives are indicated in the HPI above.  All other systems are negative.  Review of Systems  Constitutional:  Positive for chills. Negative for fever.  Respiratory:  Negative for chest tightness and shortness of breath.   Cardiovascular:  Negative for chest pain and leg swelling.  Gastrointestinal:  Positive for abdominal pain, nausea and vomiting. Negative for diarrhea.  Neurological:  Negative for dizziness and headaches.  Psychiatric/Behavioral:  Negative for confusion.     Vital Signs: BP (!) 142/78 (BP Location: Left Arm)   Pulse 96   Temp 98.3 F (36.8 C) (Oral)   Resp 19   Ht 5\' 1"  (1.549 m)   Wt 152 lb 1.9 oz (69 kg)   SpO2 94%   BMI 28.74 kg/m    Physical Exam Vitals  reviewed.  Constitutional:      General: She is not in acute distress.    Appearance: She is ill-appearing.  Eyes:     Pupils: Pupils are equal, round, and reactive to light.  Cardiovascular:     Rate and Rhythm: Regular rhythm. Tachycardia present.     Pulses: Normal pulses.     Heart sounds: Normal heart sounds.  Pulmonary:     Effort: Pulmonary effort is normal.     Breath sounds: Normal breath sounds.  Abdominal:     General: Bowel sounds are normal.     Palpations: Abdomen is soft.     Tenderness: There is abdominal tenderness.  Musculoskeletal:     Right lower leg: No edema.     Left lower leg: No edema.  Skin:    General: Skin is warm and dry.  Neurological:     Mental Status: She is alert and oriented to person, place, and time.  Psychiatric:        Mood and Affect: Mood normal.        Behavior: Behavior normal.        Thought Content: Thought content normal.        Judgment: Judgment normal.     Imaging: CT ABDOMEN PELVIS W CONTRAST  Result Date: 07/17/2022 CLINICAL DATA:  Suspected bowel obstruction EXAM: CT ABDOMEN AND PELVIS WITH CONTRAST TECHNIQUE: Multidetector CT imaging of the abdomen and pelvis was performed using the standard protocol following bolus administration of intravenous contrast. RADIATION DOSE REDUCTION: This exam was performed according to the departmental dose-optimization program which includes automated exposure control, adjustment of the mA and/or kV according to patient size and/or use of iterative reconstruction technique. CONTRAST:  75mL OMNIPAQUE IOHEXOL 350 MG/ML SOLN COMPARISON:  CT 08/02/2018, 07/22/2018 FINDINGS: Lower chest: Lung bases demonstrate no acute airspace disease. Cardiomegaly. Trace right-sided pleural effusion. Hepatobiliary: No focal hepatic abnormality. Multiple gallstones. Abnormal fluid adjacent to the gallbladder either representing Peri coli cystic fluid versus marked gallbladder wall thickening. No biliary dilatation  Pancreas: Unremarkable. No pancreatic ductal dilatation or surrounding inflammatory changes. Spleen: Normal in size without focal abnormality. Adrenals/Urinary Tract: Adrenal glands are within normal limits. Kidneys show no hydronephrosis. Cortical scarring mid pole right kidney. The bladder is unremarkable. Stomach/Bowel: The stomach is nonenlarged. Status post small bowel resection with ileo colic  anastomosis in the right lower quadrant. Mild fecalized appearance of small bowel in the region probably due to slow transit. No convincing obstruction. No acute bowel wall thickening. Diverticular disease of the left colon. Vascular/Lymphatic: Mild aortic atherosclerosis. No aneurysm. No suspicious lymph nodes Reproductive: Status post hysterectomy. No adnexal masses. Other: No free air. Small volume free fluid within the abdomen and pelvis. Anterior abdominal wall laxity. Small inferior ventral hernia containing mesenteric fat, small ascites as well as bowel but no obstructive change. Musculoskeletal: Chronic compression deformity at L1 IMPRESSION: 1. Multiple gallstones. Abnormal fluid adjacent to the gallbladder either representing pericholecystic fluid versus marked gallbladder wall thickening. Recommend correlation with right upper quadrant ultrasound. 2. Status post small bowel resection with ileocolic anastomosis in the right lower quadrant. Mild fecalized appearance of small bowel in the region probably due to slow transit. No convincing evidence for bowel obstruction. 3. Small volume free fluid within the abdomen and pelvis. Diverticular disease of the left colon without acute wall thickening. 4. Cardiomegaly. Trace right-sided pleural effusion. 5. Small inferior ventral hernia containing mesenteric fat, small ascites as well as bowel but no obstructive change. Aortic Atherosclerosis (ICD10-I70.0). Electronically Signed   By: Jasmine Pang M.D.   On: 07/17/2022 19:56    Labs:  CBC: Recent Labs     07/17/22 1900 07/18/22 0038  WBC 12.7* 8.9  HGB 13.6 12.3  HCT 42.6 36.8  PLT 231 210    COAGS: No results for input(s): "INR", "APTT" in the last 8760 hours.  BMP: Recent Labs    07/17/22 1900 07/18/22 0038  NA 132* 130*  K 5.1 4.6  CL 100 100  CO2 15* 18*  GLUCOSE 123* 109*  BUN 25* 22  CALCIUM 10.0 9.1  CREATININE 1.20* 1.08*  GFRNONAA 44* 49*    LIVER FUNCTION TESTS: Recent Labs    07/17/22 1900 07/18/22 0038  BILITOT 0.9 1.3*  AST 132* 83*  ALT 71* 59*  ALKPHOS 68 65  PROT 6.8 6.2*  ALBUMIN 4.3 3.7    TUMOR MARKERS: No results for input(s): "AFPTM", "CEA", "CA199", "CHROMGRNA" in the last 8760 hours.  Assessment and Plan:  Rachel Park is an extremely pleasant 89 yo female being seen today in relation to acute calculous cholecystitis. Patient was examined by surgical team and felt to be a candidate for percutaneous cholecystostomy. She was experiencing RUQ tenderness at time of exam, but states she is feeling much better than when she originally came to the hospital. Patient was in agreement with plan for percutaneous cholecystostomy. Per patient request, plan was shared with her son, Dr Gretta Began, who was also in agreement with plan. Case has been reviewed and approved by Dr Loreta Ave for 07/18/22. Patient is NPO and is not taking any blood thinners. Hgb is 12.3, WBC of 8.9 this AM.  Risks and benefits discussed with the patient including, but not limited to bleeding, infection, gallbladder perforation, bile leak, sepsis or even death.  All of the patient's questions were answered, patient is agreeable to proceed. Consent signed and in chart.   Thank you for this interesting consult.  I greatly enjoyed meeting Rachel Park and look forward to participating in their care.  A copy of this report was sent to the requesting provider on this date.  Electronically Signed: Kennieth Francois, PA-C 07/18/2022, 9:27 AM   I spent a total of 40 Minutes in face to face  in clinical consultation, greater than 50% of which was counseling/coordinating care for acute calculous  cholecystitis.

## 2022-07-18 NOTE — Procedures (Signed)
Interventional Radiology Procedure Note  Procedure: Image guided perc chole placement.  23F pigtail drain.  Complications: None  EBL: None Sample: Culture sent  Recommendations: - Routine drain care, with sterile flushes, record output - follow up Cx - routine wound care - drain education - observe for any rigors  Signed,  Yvone Neu. Loreta Ave, DO

## 2022-07-18 NOTE — Progress Notes (Signed)
Subjective: CC: Feels much better since admission but still with epigastric and ruq ttp. No n/v.   Afebrile. No tachycardia or hypotension. WBC 8.9. Lactic wnl. Alk Phos and Lipase wnl. AST 83, ALT 59, T.Bili 1.3. Cr improving.   Objective: Vital signs in last 24 hours: Temp:  [97.7 F (36.5 C)-98.4 F (36.9 C)] 98.3 F (36.8 C) (05/14 0410) Pulse Rate:  [28-97] 79 (05/14 0410) Resp:  [19-33] 19 (05/14 0410) BP: (123-162)/(76-112) 124/76 (05/14 0410) SpO2:  [94 %-100 %] 95 % (05/14 0410) Weight:  [69 kg] 69 kg (05/13 1855) Last BM Date : 07/17/22  Intake/Output from previous day: 05/13 0701 - 05/14 0700 In: 1456.4 [I.V.:373.2; IV Piggyback:1083.3] Out: -  Intake/Output this shift: No intake/output data recorded.  PE: Gen:  Alert, NAD, pleasant Abd: Soft, ND, epigastric ttp along with RUQ ttp. Otherwise NT. +BS. Prior midline scar noted.  Psych: A&Ox3   Lab Results:  Recent Labs    07/17/22 1900 07/18/22 0038  WBC 12.7* 8.9  HGB 13.6 12.3  HCT 42.6 36.8  PLT 231 210   BMET Recent Labs    07/17/22 1900 07/18/22 0038  NA 132* 130*  K 5.1 4.6  CL 100 100  CO2 15* 18*  GLUCOSE 123* 109*  BUN 25* 22  CREATININE 1.20* 1.08*  CALCIUM 10.0 9.1   PT/INR No results for input(s): "LABPROT", "INR" in the last 72 hours. CMP     Component Value Date/Time   NA 130 (L) 07/18/2022 0038   NA 137 07/29/2020 0806   K 4.6 07/18/2022 0038   CL 100 07/18/2022 0038   CO2 18 (L) 07/18/2022 0038   GLUCOSE 109 (H) 07/18/2022 0038   BUN 22 07/18/2022 0038   BUN 14 07/29/2020 0806   CREATININE 1.08 (H) 07/18/2022 0038   CREATININE 0.46 (L) 06/30/2015 0848   CALCIUM 9.1 07/18/2022 0038   PROT 6.2 (L) 07/18/2022 0038   PROT 6.2 06/16/2019 1105   ALBUMIN 3.7 07/18/2022 0038   ALBUMIN 4.4 06/16/2019 1105   AST 83 (H) 07/18/2022 0038   ALT 59 (H) 07/18/2022 0038   ALKPHOS 65 07/18/2022 0038   BILITOT 1.3 (H) 07/18/2022 0038   BILITOT 0.6 06/16/2019 1105    GFRNONAA 49 (L) 07/18/2022 0038   GFRAA 98 01/20/2020 0826   Lipase     Component Value Date/Time   LIPASE 30 07/17/2022 1900    Studies/Results: CT ABDOMEN PELVIS W CONTRAST  Result Date: 07/17/2022 CLINICAL DATA:  Suspected bowel obstruction EXAM: CT ABDOMEN AND PELVIS WITH CONTRAST TECHNIQUE: Multidetector CT imaging of the abdomen and pelvis was performed using the standard protocol following bolus administration of intravenous contrast. RADIATION DOSE REDUCTION: This exam was performed according to the departmental dose-optimization program which includes automated exposure control, adjustment of the mA and/or kV according to patient size and/or use of iterative reconstruction technique. CONTRAST:  75mL OMNIPAQUE IOHEXOL 350 MG/ML SOLN COMPARISON:  CT 08/02/2018, 07/22/2018 FINDINGS: Lower chest: Lung bases demonstrate no acute airspace disease. Cardiomegaly. Trace right-sided pleural effusion. Hepatobiliary: No focal hepatic abnormality. Multiple gallstones. Abnormal fluid adjacent to the gallbladder either representing Peri coli cystic fluid versus marked gallbladder wall thickening. No biliary dilatation Pancreas: Unremarkable. No pancreatic ductal dilatation or surrounding inflammatory changes. Spleen: Normal in size without focal abnormality. Adrenals/Urinary Tract: Adrenal glands are within normal limits. Kidneys show no hydronephrosis. Cortical scarring mid pole right kidney. The bladder is unremarkable. Stomach/Bowel: The stomach is nonenlarged. Status post small bowel resection  with ileo colic anastomosis in the right lower quadrant. Mild fecalized appearance of small bowel in the region probably due to slow transit. No convincing obstruction. No acute bowel wall thickening. Diverticular disease of the left colon. Vascular/Lymphatic: Mild aortic atherosclerosis. No aneurysm. No suspicious lymph nodes Reproductive: Status post hysterectomy. No adnexal masses. Other: No free air. Small volume  free fluid within the abdomen and pelvis. Anterior abdominal wall laxity. Small inferior ventral hernia containing mesenteric fat, small ascites as well as bowel but no obstructive change. Musculoskeletal: Chronic compression deformity at L1 IMPRESSION: 1. Multiple gallstones. Abnormal fluid adjacent to the gallbladder either representing pericholecystic fluid versus marked gallbladder wall thickening. Recommend correlation with right upper quadrant ultrasound. 2. Status post small bowel resection with ileocolic anastomosis in the right lower quadrant. Mild fecalized appearance of small bowel in the region probably due to slow transit. No convincing evidence for bowel obstruction. 3. Small volume free fluid within the abdomen and pelvis. Diverticular disease of the left colon without acute wall thickening. 4. Cardiomegaly. Trace right-sided pleural effusion. 5. Small inferior ventral hernia containing mesenteric fat, small ascites as well as bowel but no obstructive change. Aortic Atherosclerosis (ICD10-I70.0). Electronically Signed   By: Jasmine Pang M.D.   On: 07/17/2022 19:56    Anti-infectives: Anti-infectives (From admission, onward)    Start     Dose/Rate Route Frequency Ordered Stop   07/18/22 0600  piperacillin-tazobactam (ZOSYN) IVPB 3.375 g  Status:  Discontinued        3.375 g 12.5 mL/hr over 240 Minutes Intravenous Every 8 hours 07/17/22 2317 07/17/22 2321   07/18/22 0200  piperacillin-tazobactam (ZOSYN) IVPB 3.375 g        3.375 g 12.5 mL/hr over 240 Minutes Intravenous Every 8 hours 07/17/22 2321     07/17/22 1945  piperacillin-tazobactam (ZOSYN) IVPB 3.375 g        3.375 g 12.5 mL/hr over 240 Minutes Intravenous  Once 07/17/22 1939 07/17/22 2325        Assessment/Plan Acute Cholecystitis - Cont abx - After consideration of patients prior surgical hx would recommend perc chole drain by IR. Will consult their team. Discussed post perc chole drain plans with patient.  - We will  follow with you  FEN - NPO for IR eval. IVF per TRH VTE - SCDs, Hold Eliquis. Okay for heparin gtt if no procedures by IR planned today.  ID - Zosyn  Hx ex lap, LOA, SBR, left open on 07/23/18 by MW  > take back and ileocecectomy and closure on 07/25/18 by MW. Post op issues with midline wound separation  AKI - Cr improving PAF on Eliquis CAD s/p stent to LAD in 2016 HTN Hypothyroidism   I reviewed nursing notes, last 24 h vitals and pain scores, last 48 h intake and output, last 24 h labs and trends, and last 24 h imaging results.    LOS: 1 day    Jacinto Halim , [facility] 07/18/2022, 7:48 AM Please see Amion for pager number during day hours 7:00am-4:30pm

## 2022-07-18 NOTE — Progress Notes (Signed)
PROGRESS NOTE  Rachel Park ZOX:[id number] DOB: December 30, 1931   PCP: Cleatis Polka., MD  Patient is from: Home  DOA: 07/17/2022 LOS: 1  Chief complaints No chief complaint on file.    Brief Narrative / Interim history: 89 y.o. female with medical history significant for persistent A-fib on Eliquis, CAD s/p stent to LAD 2016, history of CVA, HTN, hypothyroidism, SBO s/p SBR and ileocecectomy with ABThera and takeback by Dr. Dwain Sarna in 2020 who presented to the ED with acute abdominal pain and admitted for calculus cholecystitis as noted on CT abdomen and pelvis.  She also had mild AKI and mildly elevated LFT with normal bilirubin.  Patient was started on IV Zosyn.  General surgery and IR consulted.  Plan is for percutaneous cholecystostomy tube by IR today.    Subjective: Seen and examined earlier this morning.  No major events overnight of this morning.  Has no complaints.  She denies nausea, vomiting or abdominal pain.  Denies chest pain or dyspnea.  Objective: Vitals:   07/17/22 2259 07/18/22 0021 07/18/22 0410 07/18/22 0751  BP: (!) 140/84 (!) 147/84 124/76 (!) 142/78  Pulse: 84 97 79 96  Resp: (!) 24 20 19    Temp: 97.7 F (36.5 C) 98 F (36.7 C) 98.3 F (36.8 C)   TempSrc: Oral Oral Oral   SpO2: 96% 97% 95% 94%  Weight:      Height:        Examination:  GENERAL: No apparent distress.  Nontoxic. HEENT: MMM.  Vision and hearing grossly intact.  NECK: Supple.  No apparent JVD.  RESP:  No IWOB.  Fair aeration bilaterally. CVS:  RRR. Heart sounds normal.  ABD/GI/GU: BS+. Abd soft.  RUQ tenderness.  Murphy positive. MSK/EXT:  Moves extremities. No apparent deformity. No edema.  SKIN: no apparent skin lesion or wound NEURO: Awake, alert and oriented appropriately.  No apparent focal neuro deficit. PSYCH: Calm. Normal affect.   Procedures:  None  Microbiology summarized: Blood cultures NGTD  Assessment and plan: Principal Problem:   Cholecystitis with  cholelithiasis Active Problems:   Hypothyroidism   AKI (acute kidney injury) (HCC)   Persistent atrial fibrillation/flutter   CAD (coronary artery disease)  Acute calculus cholecystitis: Presents with acute abdominal pain, AKI and mildly elevated LFT.  CT suggests calculus cholecystitis.  Has mild leukocytosis.  No fever.  Pain seems to have resolved.  She has RUQ tenderness with positive Murphy sign.  Blood cultures negative. -Continue IV Zosyn -General surgery and IR: percutaneous cholecystostomy tube given prior complex abdominal surgery -Continue holding Eliquis -Pain control -IV fluid.  Changed LR to NS due to hyponatremia  Acute kidney injury: Cr 1.2 (baseline 0.6).  Improving. -Continue IV fluid -Recheck in the morning  Mild elevated LFT: Likely due to #1.  Improved. -Check CK -Continue monitoring  Persistent atrial fibrillation: Rate controlled. -Continue Toprol-XL and Cardizem -Hold Eliquis for procedure -Optimize electrolytes  CAD s/p stent to LAD 2016: No anginal symptoms. -Continue Toprol-XL and rosuvastatin.   History of SBO s/p SBR and ileocecectomy 2020: -No evidence of bowel obstruction on CT.   Hypertension: -Continuing Toprol-XL and diltiazem.   Hypothyroidism: -Continue Synthroid.  Hyponatremia: -Change LR to NS. -Recheck in the morning  Anion gap metabolic acidosis: Anion gap closed.  Acidosis improved -Continue monitoring  Body mass index is 28.74 kg/m.           DVT prophylaxis:  SCDs Start: 07/17/22 2315  Code Status: DNR/DNI Family Communication: None at the bedside  Level of care: Telemetry Medical Status is: Inpatient Remains inpatient appropriate because: Acute calculus cholecystitis   Final disposition: Home Consultants:  General surgery Interventional radiology  55 minutes with more than 50% spent in reviewing records, counseling patient/family and coordinating care.   Sch Meds:  Scheduled Meds:  diltiazem  300 mg  Oral Daily   levothyroxine  88 mcg Oral Q0600   metoprolol succinate  100 mg Oral BID   rosuvastatin  10 mg Oral Daily   sodium chloride flush  3 mL Intravenous Q12H   Continuous Infusions:  sodium chloride 75 mL/hr at 07/18/22 0913   piperacillin-tazobactam (ZOSYN)  IV 12.5 mL/hr at 07/18/22 0541   PRN Meds:.fentaNYL (SUBLIMAZE) injection, HYDROmorphone (DILAUDID) injection, ondansetron **OR** ondansetron (ZOFRAN) IV  Antimicrobials: Anti-infectives (From admission, onward)    Start     Dose/Rate Route Frequency Ordered Stop   07/18/22 0600  piperacillin-tazobactam (ZOSYN) IVPB 3.375 g  Status:  Discontinued        3.375 g 12.5 mL/hr over 240 Minutes Intravenous Every 8 hours 07/17/22 2317 07/17/22 2321   07/18/22 0200  piperacillin-tazobactam (ZOSYN) IVPB 3.375 g        3.375 g 12.5 mL/hr over 240 Minutes Intravenous Every 8 hours 07/17/22 2321     07/17/22 1945  piperacillin-tazobactam (ZOSYN) IVPB 3.375 g        3.375 g 12.5 mL/hr over 240 Minutes Intravenous  Once 07/17/22 1939 07/17/22 2325        I have personally reviewed the following labs and images: CBC: Recent Labs  Lab 07/17/22 1900 07/18/22 0038  WBC 12.7* 8.9  NEUTROABS 10.3*  --   HGB 13.6 12.3  HCT 42.6 36.8  MCV 99.1 92.7  PLT 231 210   BMP &GFR Recent Labs  Lab 07/17/22 1900 07/18/22 0038  NA 132* 130*  K 5.1 4.6  CL 100 100  CO2 15* 18*  GLUCOSE 123* 109*  BUN 25* 22  CREATININE 1.20* 1.08*  CALCIUM 10.0 9.1   Estimated Creatinine Clearance: 32 mL/min (A) (by C-G formula based on SCr of 1.08 mg/dL (H)). Liver & Pancreas: Recent Labs  Lab 07/17/22 1900 07/18/22 0038  AST 132* 83*  ALT 71* 59*  ALKPHOS 68 65  BILITOT 0.9 1.3*  PROT 6.8 6.2*  ALBUMIN 4.3 3.7   Recent Labs  Lab 07/17/22 1900  LIPASE 30   No results for input(s): "AMMONIA" in the last 168 hours. Diabetic: No results for input(s): "HGBA1C" in the last 72 hours. No results for input(s): "GLUCAP" in the last  168 hours. Cardiac Enzymes: No results for input(s): "CKTOTAL", "CKMB", "CKMBINDEX", "TROPONINI" in the last 168 hours. No results for input(s): "PROBNP" in the last 8760 hours. Coagulation Profile: No results for input(s): "INR", "PROTIME" in the last 168 hours. Thyroid Function Tests: No results for input(s): "TSH", "T4TOTAL", "FREET4", "T3FREE", "THYROIDAB" in the last 72 hours. Lipid Profile: No results for input(s): "CHOL", "HDL", "LDLCALC", "TRIG", "CHOLHDL", "LDLDIRECT" in the last 72 hours. Anemia Panel: No results for input(s): "VITAMINB12", "FOLATE", "FERRITIN", "TIBC", "IRON", "RETICCTPCT" in the last 72 hours. Urine analysis:    Component Value Date/Time   COLORURINE YELLOW 07/17/2022 2150   APPEARANCEUR CLEAR 07/17/2022 2150   LABSPEC 1.035 (H) 07/17/2022 2150   PHURINE 5.0 07/17/2022 2150   GLUCOSEU NEGATIVE 07/17/2022 2150   HGBUR NEGATIVE 07/17/2022 2150   BILIRUBINUR NEGATIVE 07/17/2022 2150   KETONESUR 5 (A) 07/17/2022 2150   PROTEINUR 30 (A) 07/17/2022 2150   UROBILINOGEN 0.2 12/24/2014 1132  NITRITE NEGATIVE 07/17/2022 2150   LEUKOCYTESUR NEGATIVE 07/17/2022 2150   Sepsis Labs: Invalid input(s): "PROCALCITONIN", "LACTICIDVEN"  Microbiology: Recent Results (from the past 240 hour(s))  Blood culture (routine x 2)     Status: None (Preliminary result)   Collection Time: 07/17/22  8:15 PM   Specimen: BLOOD RIGHT HAND  Result Value Ref Range Status   Specimen Description BLOOD RIGHT HAND  Final   Special Requests   Final    BOTTLES DRAWN AEROBIC AND ANAEROBIC Blood Culture results may not be optimal due to an inadequate volume of blood received in culture bottles   Culture   Final    NO GROWTH < 12 HOURS Performed at [facility], [address], [place], [place] [address]    Report Status PENDING  Incomplete  Blood culture (routine x 2)     Status: None (Preliminary result)   Collection Time: 07/17/22  8:15 PM   Specimen: BLOOD  Result Value Ref  Range Status   Specimen Description BLOOD LEFT ANTECUBITAL  Final   Special Requests   Final    BOTTLES DRAWN AEROBIC AND ANAEROBIC Blood Culture adequate volume   Culture   Final    NO GROWTH < 12 HOURS Performed at [facility], [address], [place], [place] [address]    Report Status PENDING  Incomplete    Radiology Studies: CT ABDOMEN PELVIS W CONTRAST  Result Date: 07/17/2022 CLINICAL DATA:  Suspected bowel obstruction EXAM: CT ABDOMEN AND PELVIS WITH CONTRAST TECHNIQUE: Multidetector CT imaging of the abdomen and pelvis was performed using the standard protocol following bolus administration of intravenous contrast. RADIATION DOSE REDUCTION: This exam was performed according to the departmental dose-optimization program which includes automated exposure control, adjustment of the mA and/or kV according to patient size and/or use of iterative reconstruction technique. CONTRAST:  75mL OMNIPAQUE IOHEXOL 350 MG/ML SOLN COMPARISON:  CT 08/02/2018, 07/22/2018 FINDINGS: Lower chest: Lung bases demonstrate no acute airspace disease. Cardiomegaly. Trace right-sided pleural effusion. Hepatobiliary: No focal hepatic abnormality. Multiple gallstones. Abnormal fluid adjacent to the gallbladder either representing Peri coli cystic fluid versus marked gallbladder wall thickening. No biliary dilatation Pancreas: Unremarkable. No pancreatic ductal dilatation or surrounding inflammatory changes. Spleen: Normal in size without focal abnormality. Adrenals/Urinary Tract: Adrenal glands are within normal limits. Kidneys show no hydronephrosis. Cortical scarring mid pole right kidney. The bladder is unremarkable. Stomach/Bowel: The stomach is nonenlarged. Status post small bowel resection with ileo colic anastomosis in the right lower quadrant. Mild fecalized appearance of small bowel in the region probably due to slow transit. No convincing obstruction. No acute bowel wall thickening. Diverticular disease of  the left colon. Vascular/Lymphatic: Mild aortic atherosclerosis. No aneurysm. No suspicious lymph nodes Reproductive: Status post hysterectomy. No adnexal masses. Other: No free air. Small volume free fluid within the abdomen and pelvis. Anterior abdominal wall laxity. Small inferior ventral hernia containing mesenteric fat, small ascites as well as bowel but no obstructive change. Musculoskeletal: Chronic compression deformity at L1 IMPRESSION: 1. Multiple gallstones. Abnormal fluid adjacent to the gallbladder either representing pericholecystic fluid versus marked gallbladder wall thickening. Recommend correlation with right upper quadrant ultrasound. 2. Status post small bowel resection with ileocolic anastomosis in the right lower quadrant. Mild fecalized appearance of small bowel in the region probably due to slow transit. No convincing evidence for bowel obstruction. 3. Small volume free fluid within the abdomen and pelvis. Diverticular disease of the left colon without acute wall thickening. 4. Cardiomegaly. Trace right-sided pleural  effusion. 5. Small inferior ventral hernia containing mesenteric fat, small ascites as well as bowel but no obstructive change. Aortic Atherosclerosis (ICD10-I70.0). Electronically Signed   By: Jasmine Pang M.D.   On: 07/17/2022 19:56      Elisavet Buehrer T. Reniya Mcclees [facility]  If 7PM-7AM, please contact night-coverage [email or web address] 07/18/2022, 11:34 AM

## 2022-07-19 DIAGNOSIS — K8000 Calculus of gallbladder with acute cholecystitis without obstruction: Secondary | ICD-10-CM | POA: Diagnosis not present

## 2022-07-19 DIAGNOSIS — I4819 Other persistent atrial fibrillation: Secondary | ICD-10-CM

## 2022-07-19 DIAGNOSIS — N179 Acute kidney failure, unspecified: Secondary | ICD-10-CM | POA: Diagnosis not present

## 2022-07-19 DIAGNOSIS — E039 Hypothyroidism, unspecified: Secondary | ICD-10-CM | POA: Diagnosis not present

## 2022-07-19 DIAGNOSIS — D649 Anemia, unspecified: Secondary | ICD-10-CM

## 2022-07-19 LAB — COMPREHENSIVE METABOLIC PANEL
ALT: 39 U/L (ref 0–44)
AST: 43 U/L — ABNORMAL HIGH (ref 15–41)
Albumin: 3.2 g/dL — ABNORMAL LOW (ref 3.5–5.0)
Alkaline Phosphatase: 51 U/L (ref 38–126)
Anion gap: 9 (ref 5–15)
BUN: 11 mg/dL (ref 8–23)
CO2: 20 mmol/L — ABNORMAL LOW (ref 22–32)
Calcium: 8.4 mg/dL — ABNORMAL LOW (ref 8.9–10.3)
Chloride: 104 mmol/L (ref 98–111)
Creatinine, Ser: 0.7 mg/dL (ref 0.44–1.00)
GFR, Estimated: >60 mL/min (ref >60)
Glucose, Bld: 95 mg/dL (ref 70–99)
Potassium: 3.9 mmol/L (ref 3.5–5.1)
Sodium: 133 mmol/L — ABNORMAL LOW (ref 135–145)
Total Bilirubin: 1.1 mg/dL (ref 0.3–1.2)
Total Protein: 5.6 g/dL — ABNORMAL LOW (ref 6.5–8.1)

## 2022-07-19 LAB — CBC WITH DIFFERENTIAL/PLATELET
Abs Immature Granulocytes: 0.03 10*3/uL (ref 0.00–0.07)
Basophils Absolute: 0 10*3/uL (ref 0.0–0.1)
Basophils Relative: 0 %
Eosinophils Absolute: 0.1 10*3/uL (ref 0.0–0.5)
Eosinophils Relative: 1 %
HCT: 33.3 % — ABNORMAL LOW (ref 36.0–46.0)
Hemoglobin: 10.9 g/dL — ABNORMAL LOW (ref 12.0–15.0)
Immature Granulocytes: 0 %
Lymphocytes Relative: 11 %
Lymphs Abs: 1 10*3/uL (ref 0.7–4.0)
MCH: 30.7 pg (ref 26.0–34.0)
MCHC: 32.7 g/dL (ref 30.0–36.0)
MCV: 93.8 fL (ref 80.0–100.0)
Monocytes Absolute: 0.8 10*3/uL (ref 0.1–1.0)
Monocytes Relative: 9 %
Neutro Abs: 7.3 10*3/uL (ref 1.7–7.7)
Neutrophils Relative %: 79 %
Platelets: 188 10*3/uL (ref 150–400)
RBC: 3.55 MIL/uL — ABNORMAL LOW (ref 3.87–5.11)
RDW: 13.3 % (ref 11.5–15.5)
WBC: 9.2 10*3/uL (ref 4.0–10.5)
nRBC: 0 % (ref 0.0–0.2)

## 2022-07-19 LAB — PHOSPHORUS: Phosphorus: 2.7 mg/dL (ref 2.5–4.6)

## 2022-07-19 LAB — CULTURE, BLOOD (ROUTINE X 2): Special Requests: ADEQUATE

## 2022-07-19 LAB — MAGNESIUM: Magnesium: 1.5 mg/dL — ABNORMAL LOW (ref 1.7–2.4)

## 2022-07-19 LAB — C-REACTIVE PROTEIN: CRP: 1.3 mg/dL — ABNORMAL HIGH (ref <1.0)

## 2022-07-19 MED ORDER — OXYCODONE HCL 5 MG PO TABS
2.5000 mg | ORAL_TABLET | ORAL | Status: DC | PRN
  Administered 2022-07-20: 5 mg via ORAL
  Filled 2022-07-19: qty 1

## 2022-07-19 MED ORDER — APIXABAN 2.5 MG PO TABS
2.5000 mg | ORAL_TABLET | Freq: Two times a day (BID) | ORAL | Status: DC
  Administered 2022-07-19 – 2022-07-20 (×2): 2.5 mg via ORAL
  Filled 2022-07-19 (×2): qty 1

## 2022-07-19 MED ORDER — SODIUM CHLORIDE 0.9 % IV SOLN: INTRAVENOUS | Status: AC

## 2022-07-19 MED ORDER — HYDROMORPHONE HCL 1 MG/ML IJ SOLN: 0.5000 mg | Freq: Four times a day (QID) | INTRAMUSCULAR | Status: DC | PRN

## 2022-07-19 MED ORDER — ACETAMINOPHEN 325 MG PO TABS: 650.0000 mg | ORAL_TABLET | Freq: Four times a day (QID) | ORAL | Status: DC | PRN

## 2022-07-19 MED ORDER — MAGNESIUM SULFATE 2 GM/50ML IV SOLN
2.0000 g | Freq: Once | INTRAVENOUS | Status: AC
  Administered 2022-07-19: 2 g via INTRAVENOUS
  Filled 2022-07-19: qty 50

## 2022-07-19 NOTE — Progress Notes (Signed)
Subjective: CC: S/p perc chole yesterday by IR. RUQ pain greatly improved. Tolerated HH diet for dinner yesterday without n/v. BM this am. Mobilized with staff.   Objective: Vital signs in last 24 hours: Temp:  [98.4 F (36.9 C)-98.7 F (37.1 C)] 98.7 F (37.1 C) (05/15 0409) Pulse Rate:  [75-96] 75 (05/15 0409) Resp:  [16-20] 16 (05/15 0409) BP: (116-154)/(63-90) 129/64 (05/15 0409) SpO2:  [92 %-94 %] 93 % (05/15 0409) Last BM Date : 07/17/22  Intake/Output from previous day: No intake/output data recorded. Intake/Output this shift: No intake/output data recorded.  PE: Gen:  Alert, NAD, pleasant Abd: Soft, ND, epigastric and RUQ ttp greatly improved after perc chole drain. No rigidity or gaurding. Otherwise NT. +BS. Prior midline scar noted. IR perc chole with bilious output in cannister.  Psych: A&Ox3   Lab Results:  Recent Labs    07/17/22 1900 07/18/22 0038  WBC 12.7* 8.9  HGB 13.6 12.3  HCT 42.6 36.8  PLT 231 210    BMET Recent Labs    07/17/22 1900 07/18/22 0038  NA 132* 130*  K 5.1 4.6  CL 100 100  CO2 15* 18*  GLUCOSE 123* 109*  BUN 25* 22  CREATININE 1.20* 1.08*  CALCIUM 10.0 9.1    PT/INR Recent Labs    07/18/22 1046  LABPROT 15.3*  INR 1.2   CMP     Component Value Date/Time   NA 130 (L) 07/18/2022 0038   NA 137 07/29/2020 0806   K 4.6 07/18/2022 0038   CL 100 07/18/2022 0038   CO2 18 (L) 07/18/2022 0038   GLUCOSE 109 (H) 07/18/2022 0038   BUN 22 07/18/2022 0038   BUN 14 07/29/2020 0806   CREATININE 1.08 (H) 07/18/2022 0038   CREATININE 0.46 (L) 06/30/2015 0848   CALCIUM 9.1 07/18/2022 0038   PROT 6.2 (L) 07/18/2022 0038   PROT 6.2 06/16/2019 1105   ALBUMIN 3.7 07/18/2022 0038   ALBUMIN 4.4 06/16/2019 1105   AST 83 (H) 07/18/2022 0038   ALT 59 (H) 07/18/2022 0038   ALKPHOS 65 07/18/2022 0038   BILITOT 1.3 (H) 07/18/2022 0038   BILITOT 0.6 06/16/2019 1105   GFRNONAA 49 (L) 07/18/2022 0038   GFRAA 98 01/20/2020 0826    Lipase     Component Value Date/Time   LIPASE 30 07/17/2022 1900    Studies/Results: IR Perc Cholecystostomy  Result Date: 07/18/2022 INDICATION: 89 year old female presents for percutaneous cholecystostomy EXAM: CHOLECYSTOSTOMY MEDICATIONS: None ANESTHESIA/SEDATION: Moderate (conscious) sedation was employed during this procedure. A total of Versed 1.5 mg and Fentanyl 75 mcg was administered intravenously. Moderate Sedation Time: 17 minutes. The patient's level of consciousness and vital signs were monitored continuously by radiology nursing throughout the procedure under my direct supervision. FLUOROSCOPY TIME:  Fluoroscopy Time: 0 minutes 36 seconds (3 mGy). COMPLICATIONS: None PROCEDURE: Informed written consent was obtained from the patient and the patient's family after a thorough discussion of the procedural risks, benefits and alternatives. All questions were addressed. Maximal Sterile Barrier Technique was utilized including caps, mask, sterile gowns, sterile gloves, sterile drape, hand hygiene and skin antiseptic. A timeout was performed prior to the initiation of the procedure. Ultrasound survey of the right upper quadrant was performed for planning purposes. Once the patient is prepped and draped in the usual sterile fashion, the skin and subcutaneous tissues overlying the gallbladder were generously infiltrated 1% lidocaine for local anesthesia. A coaxial needle was advanced under ultrasound guidance through the skin subcutaneous  tissues and a small segment of liver into the gallbladder lumen. With removal of the stylet, spontaneous dark bile drainage occurred. Using modified Seldinger technique, a 10 French drain was placed into the gallbladder fossa, with aspiration of the sample for the lab. Contrast injection confirmed position of the tube within the gallbladder lumen. Drainage catheter was attached to gravity drain with a suture retention placed. Patient tolerated the procedure well  and remained hemodynamically stable throughout. No complications were encountered and no significant blood loss encountered. IMPRESSION: Status post image guided percutaneous cholecystostomy Signed, Yvone Neu. Miachel Roux, RPVI [facility] Electronically Signed   By: Gilmer Mor D.O.   On: 07/18/2022 15:21   CT ABDOMEN PELVIS W CONTRAST  Result Date: 07/17/2022 CLINICAL DATA:  Suspected bowel obstruction EXAM: CT ABDOMEN AND PELVIS WITH CONTRAST TECHNIQUE: Multidetector CT imaging of the abdomen and pelvis was performed using the standard protocol following bolus administration of intravenous contrast. RADIATION DOSE REDUCTION: This exam was performed according to the departmental dose-optimization program which includes automated exposure control, adjustment of the mA and/or kV according to patient size and/or use of iterative reconstruction technique. CONTRAST:  75mL OMNIPAQUE IOHEXOL 350 MG/ML SOLN COMPARISON:  CT 08/02/2018, 07/22/2018 FINDINGS: Lower chest: Lung bases demonstrate no acute airspace disease. Cardiomegaly. Trace right-sided pleural effusion. Hepatobiliary: No focal hepatic abnormality. Multiple gallstones. Abnormal fluid adjacent to the gallbladder either representing Peri coli cystic fluid versus marked gallbladder wall thickening. No biliary dilatation Pancreas: Unremarkable. No pancreatic ductal dilatation or surrounding inflammatory changes. Spleen: Normal in size without focal abnormality. Adrenals/Urinary Tract: Adrenal glands are within normal limits. Kidneys show no hydronephrosis. Cortical scarring mid pole right kidney. The bladder is unremarkable. Stomach/Bowel: The stomach is nonenlarged. Status post small bowel resection with ileo colic anastomosis in the right lower quadrant. Mild fecalized appearance of small bowel in the region probably due to slow transit. No convincing obstruction. No acute bowel wall thickening.  Diverticular disease of the left colon. Vascular/Lymphatic: Mild aortic atherosclerosis. No aneurysm. No suspicious lymph nodes Reproductive: Status post hysterectomy. No adnexal masses. Other: No free air. Small volume free fluid within the abdomen and pelvis. Anterior abdominal wall laxity. Small inferior ventral hernia containing mesenteric fat, small ascites as well as bowel but no obstructive change. Musculoskeletal: Chronic compression deformity at L1 IMPRESSION: 1. Multiple gallstones. Abnormal fluid adjacent to the gallbladder either representing pericholecystic fluid versus marked gallbladder wall thickening. Recommend correlation with right upper quadrant ultrasound. 2. Status post small bowel resection with ileocolic anastomosis in the right lower quadrant. Mild fecalized appearance of small bowel in the region probably due to slow transit. No convincing evidence for bowel obstruction. 3. Small volume free fluid within the abdomen and pelvis. Diverticular disease of the left colon without acute wall thickening. 4. Cardiomegaly. Trace right-sided pleural effusion. 5. Small inferior ventral hernia containing mesenteric fat, small ascites as well as bowel but no obstructive change. Aortic Atherosclerosis (ICD10-I70.0). Electronically Signed   By: Jasmine Pang M.D.   On: 07/17/2022 19:56    Anti-infectives: Anti-infectives (From admission, onward)    Start     Dose/Rate Route Frequency Ordered Stop   07/18/22 0600  piperacillin-tazobactam (ZOSYN) IVPB 3.375 g  Status:  Discontinued        3.375 g 12.5 mL/hr over 240 Minutes Intravenous Every 8 hours 07/17/22 2317 07/17/22 2321   07/18/22 0200  piperacillin-tazobactam (ZOSYN) IVPB 3.375 g        3.375 g 12.5 mL/hr over  240 Minutes Intravenous Every 8 hours 07/17/22 2321     07/17/22 1945  piperacillin-tazobactam (ZOSYN) IVPB 3.375 g        3.375 g 12.5 mL/hr over 240 Minutes Intravenous  Once 07/17/22 1939 07/17/22 2325         Assessment/Plan Acute Cholecystitis - S/p perc chole 5/15. Drain/flushes per IR.  - Labs pending today.  - Cont abx. Plan 10d total. Follow cx and narrow abx based on results.  - Will arrange f/u with our office in 6 weeks for further discussions of plan moving forward. Will need f/u with IR before this for drain cholangiogram.  - Patient lives at home alone. Please ensure she is mobilizing well/safely and able to perform drain flushes per IR.  - Discussed above with her son, Dr. Arbie Cookey on speaker phone while I was in the room. - We will sign off. Please call back with questions or concerns. Discussed above with TR[facility].   FEN - HH. IVF per TRH VTE - SCDs, okay to resume eliquis from a surgery standpoint. Would ensure IR is okay with restarting as well before initiating  ID - Zosyn  Hx ex lap, LOA, SBR, left open on 07/23/18 by MW  > take back and ileocecectomy and closure on 07/25/18 by MW. Post op issues with midline wound separation  AKI - Cr improving PAF on Eliquis CAD s/p stent to LAD in 2016 HTN Hypothyroidism   I reviewed nursing notes, TR[facility] and IR notes, last 24 h vitals and pain scores, last 48 h intake and output, last 24 h labs and trends, and last 24 h imaging results.    LOS: 2 days    Jacinto Halim , [facility] Surgery 07/19/2022, 8:14 AM Please see Amion for pager number during day hours 7:00am-4:30pm

## 2022-07-19 NOTE — Progress Notes (Signed)
PROGRESS NOTE    Rachel Park  ZOX:[id number] DOB: [birth year] DOA: 07/17/2022 PCP: Cleatis Polka., MD   Brief Narrative:  Rachel Park is a 89 y.o. female with medical history significant for persistent A-fib on Eliquis, CAD s/p stent to LAD 2016, history of CVA, HTN, hypothyroidism, SBO s/p SBR and ileocecectomy with ABThera and takeback by Dr. Dwain Sarna in 2020 who is admitted with cholecystitis.    Assessment and Plan: No notes have been filed under this hospital service. Service: Hospitalist  Acute calculus cholecystitis -Presents with acute abdominal pain, AKI and mildly elevated LFT.   -CT suggests calculus cholecystitis.  Has mild leukocytosis.  No fever.   -Pain seems to have resolved.  She has RUQ tenderness with positive Murphy sign.  Blood cultures negative. -Continue IV Zosyn for now awaiting Cx's (NGTD at <24 hours) -WBC Trend: Recent Labs  Lab 07/17/22 1900 07/18/22 0038 07/19/22 0849  WBC 12.7* 8.9 9.2  -General surgery and IR: percutaneous cholecystostomy tube given prior complex abdominal surgery -Continue holding Eliquis and resume when ok with IR and General Surgery  -Pain control -IV fluid as below  Changed LR to NS due to hyponatremia   Acute kidney injury -Cr 1.2 on limited skin evaluation (baseline 0.6).  Improving. -BUN/Cr Trend: Recent Labs  Lab 07/17/22 1900 07/18/22 0038  BUN 25* 22  CREATININE 1.20* 1.08*  -Continue IV fluid -Avoid Nephrotoxic Medications, Contrast Dyes, Hypotension and Dehydration to Ensure Adequate Renal Perfusion and will need to Renally Adjust Meds -Continue to Monitor and Trend Renal Function carefully and repeat CMP in the AM   Abnormal LFTs/Mildly Elevated LFT -Likely due to #1.  Improved. -Check CK and was mildly elevated at 374 Recent Labs  Lab 07/17/22 1900 07/18/22 0038 07/19/22 0849  AST 132* 83* 43*  ALT 71* 59* 39  -Continue to Monitor Hepatic Fxn Panel and Repeat CMP in the AM   Persistent atrial  fibrillation: -Rate controlled. -Continue Toprol-XL and Cardizem -Held Eliquis for procedure and will resume once ok with Surgery and IR -Optimize electrolytes   CAD s/p stent to LAD 2016: No anginal symptoms. -Continue Toprol-XL and rosuvastatin.  Hypomagnesemia -Patient's Mag Level Trend: Recent Labs  Lab 07/19/22 0849  MG 1.5*  -Replete with IV Mag Sulfate 2 grams -Continue to Monitor and Replete as Necessary -Repeat Mag in the AM   History of SBO s/p SBR and ileocecectomy 2020: -No evidence of bowel obstruction on CT.   Hypertension: -Continuing Metoprolol Succinate 100 mg po BID and Diltiazem 300 mg po Daily  -Continue to Monitor BP per Protocol -Last BP reading was    Hypothyroidism: -Continue Levothyroxine 88 mcg po Daily.  Normocytic Anemia -Mild and likely dilutional drop; Hgb/Hct Trend: Recent Labs  Lab 07/17/22 1900 07/18/22 0038 07/19/22 0849  HGB 13.6 12.3 10.9*  HCT 42.6 36.8 33.3*  MCV 99.1 92.7 93.8  -Check Anemia Panel in the AM and continue to Monitor for S/Sx of Bleeding now that [facility] is being resumed; No overt bleeding noted -Repeat CBC in the AM   Hyponatremia: -Change LR to NS. -Na+ Trend: Recent Labs  Lab 07/17/22 1900 07/18/22 0038  NA 132* 130*  -Recheck in the morning   Anion gap metabolic acidosis: -Anion gap closed.  Acidosis improved -CO2 is now 20, AG is 9, Chloride Level is 104 -C/w IV with NS at 75 mL/hr x 12 hrs -Continue monitoring and repeat CMP in the AM    Estimated body mass index is 28.74  kg/m as calculated from the following:   Height as of this encounter: 5\' 1"  (1.549 m).   Weight as of this encounter: 69 kg.  Hypoalbuminemia -Patient's Albumin Trend: Recent Labs  Lab 07/17/22 1900 07/18/22 0038 07/19/22 0849  ALBUMIN 4.3 3.7 3.2*  -Continue to Monitor and Trend and repeat CMP in the AM  DVT prophylaxis: SCDs Start: 07/17/22 2315; [facility] resumed after Discussion with IR Dr. Grace Isaac and Gen Surgery     Code  Status: DNR Family Communication: Spoke with Son Dr. Tawanna Cooler Early over the telephone  Disposition Plan:  Level of care: Telemetry Medical Status is: Inpatient Remains inpatient appropriate because: Needs further improvement and evaluation by PT and OT as well as drain teaching by IR.  Awaiting cultures to see whether can change her antibiotics to   Consultants:  Interventional Radiology General Surgery  Procedures:  Image guided [facility] placement of a 10 French pigtail drain  Antimicrobials:  Anti-infectives (From admission, onward)    Start     Dose/Rate Route Frequency Ordered Stop   07/18/22 0600  piperacillin-tazobactam (ZOSYN) IVPB 3.375 g  Status:  Discontinued        3.375 g 12.5 mL/hr over 240 Minutes Intravenous Every 8 hours 07/17/22 2317 07/17/22 2321   07/18/22 0200  piperacillin-tazobactam (ZOSYN) IVPB 3.375 g        3.375 g 12.5 mL/hr over 240 Minutes Intravenous Every 8 hours 07/17/22 2321     07/17/22 1945  piperacillin-tazobactam (ZOSYN) IVPB 3.375 g        3.375 g 12.5 mL/hr over 240 Minutes Intravenous  Once 07/17/22 1939 07/17/22 2325       Subjective: Seen and examined at bedside and she is having some abdominal soreness but states that she is doing fairly well.  No nausea or vomiting.  Denies any lightheadedness or dizziness  Objective: Vitals:   07/18/22 1315 07/18/22 1553 07/18/22 2000 07/19/22 0409  BP: 138/65 116/63 118/66 129/64  Pulse: 85 92 77 75  Resp: 19  16 16   Temp:   98.4 F (36.9 C) 98.7 F (37.1 C)  TempSrc:    Oral  SpO2: 93% 92% 94% 93%  Weight:      Height:       No intake or output data in the 24 hours ending 07/19/22 8295 Filed Weights   07/17/22 1855  Weight: 69 kg   Examination: Physical Exam:  Constitutional: Elderly overweight Caucasian female in no acute distress Respiratory: Diminished to auscultation bilaterally, no wheezing, rales, rhonchi or crackles. Normal respiratory effort and patient is not tachypenic. No  accessory muscle use.  Unlabored breathing Cardiovascular: RRR, no murmurs / rubs / gallops. S1 and S2 auscultated.  Minimal extremity edema Abdomen: Soft, tender to palpate, distended secondary body habitus and has abdominal scars from prior surgery.  Has a percutaneous cholecystostomy drain now.  Bowel sounds positive.  GU: Deferred. Musculoskeletal: No clubbing / cyanosis of digits/nails. No joint deformity upper and lower extremities.  Skin: No rashes, lesions, ulcers on limited skin evaluation. No induration; Warm and dry.  Neurologic: CN 2-12 grossly intact with no focal deficits.  Romberg sign and cerebellar reflexes not assessed.  Psychiatric: Normal judgment and insight. Alert and oriented x 3. Normal mood and appropriate affect.   Data Reviewed: I have personally reviewed following labs and imaging studies  CBC: Recent Labs  Lab 07/17/22 1900 07/18/22 0038  WBC 12.7* 8.9  NEUTROABS 10.3*  --   HGB 13.6 12.3  HCT 42.6 36.8  MCV 99.1 92.7  PLT 231 210   Basic Metabolic Panel: Recent Labs  Lab 07/17/22 1900 07/18/22 0038  NA 132* 130*  K 5.1 4.6  CL 100 100  CO2 15* 18*  GLUCOSE 123* 109*  BUN 25* 22  CREATININE 1.20* 1.08*  CALCIUM 10.0 9.1   GFR: Estimated Creatinine Clearance: 32 mL/min (A) (by C-G formula based on SCr of 1.08 mg/dL (H)). Liver Function Tests: Recent Labs  Lab 07/17/22 1900 07/18/22 0038  AST 132* 83*  ALT 71* 59*  ALKPHOS 68 65  BILITOT 0.9 1.3*  PROT 6.8 6.2*  ALBUMIN 4.3 3.7   Recent Labs  Lab 07/17/22 1900  LIPASE 30   No results for input(s): "AMMONIA" in the last 168 hours. Coagulation Profile: Recent Labs  Lab 07/18/22 1046  INR 1.2   Cardiac Enzymes: Recent Labs  Lab 07/18/22 0038  CKTOTAL 374*   BNP (last 3 results) No results for input(s): "PROBNP" in the last 8760 hours. HbA1C: No results for input(s): "HGBA1C" in the last 72 hours. CBG: No results for input(s): "GLUCAP" in the last 168 hours. Lipid  Profile: No results for input(s): "CHOL", "HDL", "LDLCALC", "TRIG", "CHOLHDL", "LDLDIRECT" in the last 72 hours. Thyroid Function Tests: No results for input(s): "TSH", "T4TOTAL", "FREET4", "T3FREE", "THYROIDAB" in the last 72 hours. Anemia Panel: No results for input(s): "VITAMINB12", "FOLATE", "FERRITIN", "TIBC", "IRON", "RETICCTPCT" in the last 72 hours. Sepsis Labs: Recent Labs  Lab 07/17/22 2302 07/18/22 0038  LATICACIDVEN 1.5 1.5    Recent Results (from the past 240 hour(s))  Blood culture (routine x 2)     Status: None (Preliminary result)   Collection Time: 07/17/22  8:15 PM   Specimen: BLOOD RIGHT HAND  Result Value Ref Range Status   Specimen Description BLOOD RIGHT HAND  Final   Special Requests   Final    BOTTLES DRAWN AEROBIC AND ANAEROBIC Blood Culture results may not be optimal due to an inadequate volume of blood received in culture bottles   Culture   Final    NO GROWTH 2 DAYS Performed at [facility], [address] [place], [place] [address]    Report Status PENDING  Incomplete  Blood culture (routine x 2)     Status: None (Preliminary result)   Collection Time: 07/17/22  8:15 PM   Specimen: BLOOD  Result Value Ref Range Status   Specimen Description BLOOD LEFT ANTECUBITAL  Final   Special Requests   Final    BOTTLES DRAWN AEROBIC AND ANAEROBIC Blood Culture adequate volume   Culture   Final    NO GROWTH 2 DAYS Performed at [facility], [address], [place], [place] [address]    Report Status PENDING  Incomplete  Aerobic/Anaerobic Culture w Gram Stain (surgical/deep wound)     Status: None (Preliminary result)   Collection Time: 07/18/22  1:04 PM   Specimen: Gallbladder; Bile  Result Value Ref Range Status   Specimen Description GALL BLADDER  Final   Special Requests NONE  Final   Gram Stain   Final    RARE WBC PRESENT, PREDOMINANTLY PMN NO ORGANISMS SEEN    Culture   Final    NO GROWTH < 24 HOURS Performed at [facility]  Lab, [address] [place], [place] [address]    Report Status PENDING  Incomplete    Radiology Studies: IR Perc Cholecystostomy  Result Date: 07/18/2022 INDICATION: 89 year old female presents for percutaneous cholecystostomy EXAM: CHOLECYSTOSTOMY MEDICATIONS: None ANESTHESIA/SEDATION:  Moderate (conscious) sedation was employed during this procedure. A total of Versed 1.5 mg and Fentanyl 75 mcg was administered intravenously. Moderate Sedation Time: 17 minutes. The patient's level of consciousness and vital signs were monitored continuously by radiology nursing throughout the procedure under my direct supervision. FLUOROSCOPY TIME:  Fluoroscopy Time: 0 minutes 36 seconds (3 mGy). COMPLICATIONS: None PROCEDURE: Informed written consent was obtained from the patient and the patient's family after a thorough discussion of the procedural risks, benefits and alternatives. All questions were addressed. Maximal Sterile Barrier Technique was utilized including caps, mask, sterile gowns, sterile gloves, sterile drape, hand hygiene and skin antiseptic. A timeout was performed prior to the initiation of the procedure. Ultrasound survey of the right upper quadrant was performed for planning purposes. Once the patient is prepped and draped in the usual sterile fashion, the skin and subcutaneous tissues overlying the gallbladder were generously infiltrated 1% lidocaine for local anesthesia. A coaxial needle was advanced under ultrasound guidance through the skin subcutaneous tissues and a small segment of liver into the gallbladder lumen. With removal of the stylet, spontaneous dark bile drainage occurred. Using modified Seldinger technique, a 10 French drain was placed into the gallbladder fossa, with aspiration of the sample for the lab. Contrast injection confirmed position of the tube within the gallbladder lumen. Drainage catheter was attached to gravity drain with a suture retention placed. Patient tolerated the  procedure well and remained hemodynamically stable throughout. No complications were encountered and no significant blood loss encountered. IMPRESSION: Status post image guided percutaneous cholecystostomy Signed, Yvone Neu. Miachel Roux, RPVI [facility] Electronically Signed   By: Gilmer Mor D.O.   On: 07/18/2022 15:21   CT ABDOMEN PELVIS W CONTRAST  Result Date: 07/17/2022 CLINICAL DATA:  Suspected bowel obstruction EXAM: CT ABDOMEN AND PELVIS WITH CONTRAST TECHNIQUE: Multidetector CT imaging of the abdomen and pelvis was performed using the standard protocol following bolus administration of intravenous contrast. RADIATION DOSE REDUCTION: This exam was performed according to the departmental dose-optimization program which includes automated exposure control, adjustment of the mA and/or kV according to patient size and/or use of iterative reconstruction technique. CONTRAST:  75mL OMNIPAQUE IOHEXOL 350 MG/ML SOLN COMPARISON:  CT 08/02/2018, 07/22/2018 FINDINGS: Lower chest: Lung bases demonstrate no acute airspace disease. Cardiomegaly. Trace right-sided pleural effusion. Hepatobiliary: No focal hepatic abnormality. Multiple gallstones. Abnormal fluid adjacent to the gallbladder either representing Peri coli cystic fluid versus marked gallbladder wall thickening. No biliary dilatation Pancreas: Unremarkable. No pancreatic ductal dilatation or surrounding inflammatory changes. Spleen: Normal in size without focal abnormality. Adrenals/Urinary Tract: Adrenal glands are within normal limits. Kidneys show no hydronephrosis. Cortical scarring mid pole right kidney. The bladder is unremarkable. Stomach/Bowel: The stomach is nonenlarged. Status post small bowel resection with ileo colic anastomosis in the right lower quadrant. Mild fecalized appearance of small bowel in the region probably due to slow transit. No convincing obstruction. No acute bowel  wall thickening. Diverticular disease of the left colon. Vascular/Lymphatic: Mild aortic atherosclerosis. No aneurysm. No suspicious lymph nodes Reproductive: Status post hysterectomy. No adnexal masses. Other: No free air. Small volume free fluid within the abdomen and pelvis. Anterior abdominal wall laxity. Small inferior ventral hernia containing mesenteric fat, small ascites as well as bowel but no obstructive change. Musculoskeletal: Chronic compression deformity at L1 IMPRESSION: 1. Multiple gallstones. Abnormal fluid adjacent to the gallbladder either representing pericholecystic fluid versus marked gallbladder wall thickening. Recommend correlation with right upper quadrant ultrasound. 2. Status post  small bowel resection with ileocolic anastomosis in the right lower quadrant. Mild fecalized appearance of small bowel in the region probably due to slow transit. No convincing evidence for bowel obstruction. 3. Small volume free fluid within the abdomen and pelvis. Diverticular disease of the left colon without acute wall thickening. 4. Cardiomegaly. Trace right-sided pleural effusion. 5. Small inferior ventral hernia containing mesenteric fat, small ascites as well as bowel but no obstructive change. Aortic Atherosclerosis (ICD10-I70.0). Electronically Signed   By: Jasmine Pang M.D.   On: 07/17/2022 19:56     Scheduled Meds:  diltiazem  300 mg Oral Daily   levothyroxine  88 mcg Oral Q0600   metoprolol succinate  100 mg Oral BID   rosuvastatin  10 mg Oral Daily   sodium chloride flush  3 mL Intravenous Q12H   Continuous Infusions:  sodium chloride 75 mL/hr at 07/18/22 0913   piperacillin-tazobactam (ZOSYN)  IV 3.375 g (07/19/22 0536)    LOS: 2 days   Marguerita Merles, DO [facility] Available via Epic secure chat 7am-7pm After these hours, please refer to coverage provider listed on [email or web address] 07/19/2022, 8:22 AM

## 2022-07-19 NOTE — Progress Notes (Signed)
ANTICOAGULATION CONSULT NOTE - Initial Consult  Pharmacy Consult for eliquis Indication: atrial fibrillation  Allergies  Allergen Reactions   Propofol Nausea And Vomiting    "with hysterectomy years back had post op N/V"   Simvastatin Other (See Comments)    Gi upset    Patient Measurements: Height: 5\' 1"  (154.9 cm) Weight: 69 kg (152 lb 1.9 oz) IBW/kg (Calculated) : 47.8  Vital Signs: Temp: 98.3 F (36.8 C) (05/15 1133) Temp Source: Oral (05/15 1133) BP: 134/88 (05/15 1133) Pulse Rate: 84 (05/15 1133)  Labs: Recent Labs    07/17/22 1900 07/18/22 0038 07/18/22 1046 07/19/22 0849  HGB 13.6 12.3  --  10.9*  HCT 42.6 36.8  --  33.3*  PLT 231 210  --  188  LABPROT  --   --  15.3*  --   INR  --   --  1.2  --   CREATININE 1.20* 1.08*  --  0.70  CKTOTAL  --  374*  --   --     Estimated Creatinine Clearance: 43.2 mL/min (by C-G formula based on SCr of 0.7 mg/dL).   Medical History: Past Medical History:  Diagnosis Date   Arthritis    Complication of anesthesia    Coronary artery disease    Hypertension    Hypothyroidism    PAF (paroxysmal atrial fibrillation) (HCC)    on Xarelto for maybe a year, opted to stop   PONV (postoperative nausea and vomiting)    Renal infarction [facility])    Small bowel obstruction (HCC) 06/2015   Stroke (HCC)    oct 2016   TIA (transient ischemic attack) 03/24/2015   Vertigo, benign positional    to be evaluated, intermittent    Assessment: 89 yo F with PMH afib , history of stroke on Eliquis 2.5mg  by mouth BID PTA. Medication was held for procedure, ok to resume per surgery team, and now pharmacy has been consulted to reinitiate Eliquis post-op.   Pt is s/p percutaneous chole drain placement on 5/14 @1300 .   Hgb 10.9, Plt WNL  Pt does not currently meet 2 out of 3 criteria for dose reduction. Currently she is: 89yo, Scr 0.7, 69kg. She has prior history of GIB in 2020. She has been on 5mg  BID and 2.5mg  BID at different times since  that event. I relayed this info to MD who preferred to keep dosing the same as prior to admission.    Goal of Therapy:  Monitor platelets by anticoagulation protocol: Yes   Plan:  Eliquis 2.5 mg BID - per MD  F/u CBC, s/sx bleeding  Calton Dach, PharmD Clinical Pharmacist 07/19/2022 4:17 PM

## 2022-07-20 ENCOUNTER — Other Ambulatory Visit (HOSPITAL_COMMUNITY): Payer: Self-pay

## 2022-07-20 DIAGNOSIS — Z434 Encounter for attention to other artificial openings of digestive tract: Secondary | ICD-10-CM | POA: Diagnosis not present

## 2022-07-20 DIAGNOSIS — E039 Hypothyroidism, unspecified: Secondary | ICD-10-CM | POA: Diagnosis not present

## 2022-07-20 DIAGNOSIS — I4819 Other persistent atrial fibrillation: Secondary | ICD-10-CM | POA: Diagnosis not present

## 2022-07-20 DIAGNOSIS — N179 Acute kidney failure, unspecified: Secondary | ICD-10-CM | POA: Diagnosis not present

## 2022-07-20 DIAGNOSIS — K8000 Calculus of gallbladder with acute cholecystitis without obstruction: Secondary | ICD-10-CM | POA: Diagnosis not present

## 2022-07-20 DIAGNOSIS — I25119 Atherosclerotic heart disease of native coronary artery with unspecified angina pectoris: Secondary | ICD-10-CM | POA: Diagnosis not present

## 2022-07-20 DIAGNOSIS — K810 Acute cholecystitis: Secondary | ICD-10-CM | POA: Diagnosis not present

## 2022-07-20 LAB — CBC WITH DIFFERENTIAL/PLATELET
Abs Immature Granulocytes: 0.02 10*3/uL (ref 0.00–0.07)
Basophils Absolute: 0 10*3/uL (ref 0.0–0.1)
Basophils Relative: 0 %
Eosinophils Absolute: 0.2 10*3/uL (ref 0.0–0.5)
Eosinophils Relative: 2 %
HCT: 35.7 % — ABNORMAL LOW (ref 36.0–46.0)
Hemoglobin: 11.9 g/dL — ABNORMAL LOW (ref 12.0–15.0)
Immature Granulocytes: 0 %
Lymphocytes Relative: 16 %
Lymphs Abs: 1.4 10*3/uL (ref 0.7–4.0)
MCH: 31.3 pg (ref 26.0–34.0)
MCHC: 33.3 g/dL (ref 30.0–36.0)
MCV: 93.9 fL (ref 80.0–100.0)
Monocytes Absolute: 0.6 10*3/uL (ref 0.1–1.0)
Monocytes Relative: 7 %
Neutro Abs: 6.2 10*3/uL (ref 1.7–7.7)
Neutrophils Relative %: 75 %
Platelets: 193 10*3/uL (ref 150–400)
RBC: 3.8 MIL/uL — ABNORMAL LOW (ref 3.87–5.11)
RDW: 13.3 % (ref 11.5–15.5)
WBC: 8.3 10*3/uL (ref 4.0–10.5)
nRBC: 0 % (ref 0.0–0.2)

## 2022-07-20 LAB — COMPREHENSIVE METABOLIC PANEL
ALT: 36 U/L (ref 0–44)
AST: 38 U/L (ref 15–41)
Albumin: 3.4 g/dL — ABNORMAL LOW (ref 3.5–5.0)
Alkaline Phosphatase: 54 U/L (ref 38–126)
Anion gap: 10 (ref 5–15)
BUN: 9 mg/dL (ref 8–23)
CO2: 22 mmol/L (ref 22–32)
Calcium: 8.5 mg/dL — ABNORMAL LOW (ref 8.9–10.3)
Chloride: 103 mmol/L (ref 98–111)
Creatinine, Ser: 0.64 mg/dL (ref 0.44–1.00)
GFR, Estimated: >60 mL/min (ref >60)
Glucose, Bld: 94 mg/dL (ref 70–99)
Potassium: 3.6 mmol/L (ref 3.5–5.1)
Sodium: 135 mmol/L (ref 135–145)
Total Bilirubin: 1.1 mg/dL (ref 0.3–1.2)
Total Protein: 6 g/dL — ABNORMAL LOW (ref 6.5–8.1)

## 2022-07-20 LAB — PHOSPHORUS: Phosphorus: 2.9 mg/dL (ref 2.5–4.6)

## 2022-07-20 LAB — AEROBIC/ANAEROBIC CULTURE W GRAM STAIN (SURGICAL/DEEP WOUND)

## 2022-07-20 LAB — CULTURE, BLOOD (ROUTINE X 2): Culture: NO GROWTH

## 2022-07-20 LAB — MAGNESIUM: Magnesium: 2 mg/dL (ref 1.7–2.4)

## 2022-07-20 MED ORDER — AMOXICILLIN-POT CLAVULANATE 875-125 MG PO TABS: 1.0000 | ORAL_TABLET | Freq: Two times a day (BID) | ORAL | Status: DC

## 2022-07-20 MED ORDER — OXYCODONE HCL 5 MG PO TABS
2.5000 mg | ORAL_TABLET | Freq: Four times a day (QID) | ORAL | 0 refills | Status: AC | PRN
  Filled 2022-07-20: qty 20, 5d supply, fill #0

## 2022-07-20 MED ORDER — ONDANSETRON HCL 4 MG PO TABS
4.0000 mg | ORAL_TABLET | Freq: Four times a day (QID) | ORAL | 0 refills | Status: AC | PRN
  Filled 2022-07-20: qty 20, 5d supply, fill #0

## 2022-07-20 MED ORDER — AMOXICILLIN-POT CLAVULANATE 875-125 MG PO TABS
1.0000 | ORAL_TABLET | Freq: Two times a day (BID) | ORAL | 0 refills | Status: AC
  Filled 2022-07-20: qty 14, 7d supply, fill #0

## 2022-07-20 MED ORDER — SODIUM CHLORIDE FLUSH 0.9 % IV SOLN
INTRAVENOUS | 1 refills | Status: DC
  Filled 2022-07-20: qty 10, 2d supply, fill #0

## 2022-07-20 MED ORDER — SODIUM CHLORIDE FLUSH 0.9 % IV SOLN
INTRAVENOUS | 1 refills | Status: AC
  Filled 2022-07-20: qty 300, 30d supply, fill #0
  Filled 2022-07-20: qty 10, 2d supply, fill #0

## 2022-07-20 NOTE — Discharge Summary (Signed)
Physician Discharge Summary   Patient: Rachel Park MRN: [id number] DOB: [birth year]  Admit date:     07/17/2022  Discharge date: 07/20/2022  Discharge Physician: Marguerita Merles, DO   PCP: Cleatis Polka., MD   Recommendations at discharge:   Follow-up with PCP within 1 to 2 weeks repeat CBC, CMP, mag, Phos within 1 week Follow-up with interventional radiology and drain clinic in outpatient setting and continue drain monitoring and output recording Follow-up with general surgery in outpatient setting If necessary follow-up with factious diseases  Discharge Diagnoses: Principal Problem:   Cholecystitis with cholelithiasis Active Problems:   Hypothyroidism   AKI (acute kidney injury) (HCC)   Persistent atrial fibrillation/flutter   CAD (coronary artery disease)  Resolved Problems:   * No resolved hospital problems. *  Hospital Course: Rachel Park is a 89 y.o. female with medical history significant for persistent A-fib on Eliquis, CAD s/p stent to LAD 2016, history of CVA, HTN, hypothyroidism, SBO s/p SBR and ileocecectomy with ABThera and takeback by Dr. Dwain Sarna in 2020 who is admitted with cholecystitis.  Patient underwent a percutaneous cholecystostomy tube and has been placed on antibiotics and will be changed to Augmentin.  She improved and was deemed medically stable for discharge and will need follow-up with PCP, general surgery as well as interventional radiology.  Assessment and Plan:  Acute calculus cholecystitis -Presents with acute abdominal pain, AKI and mildly elevated LFT.   -CT suggests calculus cholecystitis.  Has mild leukocytosis.  No fever.   -Pain seems to have resolved.  She has RUQ tenderness with positive Murphy sign.  Blood cultures negative. -Continue IV Zosyn for now awaiting Cx's (NGTD at 2 days and ID was consulted and recommended changing to Augmentin for total of 7 days -WBC Trend: Recent Labs  Lab 07/17/22 1900 07/18/22 0038 07/19/22 0849  07/20/22 0744  WBC 12.7* 8.9 9.2 8.3  -General surgery and IR: percutaneous cholecystostomy tube given prior complex abdominal surgery -Continue holding Eliquis and resume when ok with IR and General Surgery  -Pain control provided -IV fluid as below  Changed LR to NS due to hyponatremia -Patient was deemed stable from an ID, general surgery as well as IR standpoint and she is medically stable to follow-up with PCP and specialist   Acute kidney injury -Cr 1.2 on limited skin evaluation (baseline 0.6).  Improving. -BUN/Cr Trend: Recent Labs  Lab 07/17/22 1900 07/18/22 0038 07/19/22 0849 07/20/22 0744  BUN 25* 22 11 9   CREATININE 1.20* 1.08* 0.70 0.64  -Continue IV fluid -Avoid Nephrotoxic Medications, Contrast Dyes, Hypotension and Dehydration to Ensure Adequate Renal Perfusion and will need to Renally Adjust Meds -Continue to Monitor and Trend Renal Function carefully and repeat CMP in the AM   Abnormal LFTs/Mildly Elevated LFT -Likely due to #1.  Improved. -Check CK and was mildly elevated at 374 Recent Labs  Lab 07/17/22 1900 07/18/22 0038 07/19/22 0849 07/20/22 0744  AST 132* 83* 43* 38  ALT 71* 59* 39 36  -Continue to Monitor Hepatic Fxn Panel and Repeat CMP in the AM   Persistent atrial fibrillation: -Rate controlled. -Continue Toprol-XL and Cardizem -Held Eliquis for procedure and will resume once ok with Surgery and IR -Optimize electrolytes   CAD s/p stent to LAD 2016: No anginal symptoms. -Continue Toprol-XL and rosuvastatin.   Hypomagnesemia -Patient's Mag Level Trend: Recent Labs  Lab 07/19/22 0849 07/20/22 0744  MG 1.5* 2.0  -Replete with IV Mag Sulfate 2 grams today -Continue to Monitor and  Replete as Necessary -Repeat Mag level within 1 week   History of SBO s/p SBR and ileocecectomy 2020: -No evidence of bowel obstruction on CT.   Hypertension: -Continuing Metoprolol Succinate 100 mg po BID and Diltiazem 300 mg po Daily  -Continue to  Monitor BP per Protocol -Follow-up in outpatient setting with PCP   Hypothyroidism: -Continue Levothyroxine 88 mcg po Daily.   Normocytic Anemia -Mild and likely dilutional drop; Hgb/Hct Trend: Recent Labs  Lab 07/17/22 1900 07/18/22 0038 07/19/22 0849 07/20/22 0744  HGB 13.6 12.3 10.9* 11.9*  HCT 42.6 36.8 33.3* 35.7*  MCV 99.1 92.7 93.8 93.9  -Check Anemia Panel in the AM and continue to Monitor for S/Sx of Bleeding now that [facility] is being resumed; No overt bleeding noted -Repeat CBC level within 1 week   Hyponatremia: -Change LR to NS. -Na+ Trend: Recent Labs  Lab 07/17/22 1900 07/18/22 0038 07/19/22 0849 07/20/22 0744  NA 132* 130* 133* 135  -Repeat CMP within 1 week   Anion gap metabolic acidosis, improved -Anion gap closed.  Acidosis improved -CO2 is now 20, AG is 9, Chloride Level is 104 yesterday and today it is improved and CO2 is 22, anion gap is 10, chloride level is 103 -IV fluid with normal saline at 75 MLS per hour for 12 hours is now stopped -Continue monitoring and repeat CMP within 1 week   Estimated body mass index is 28.74 kg/m as calculated from the following:   Height as of this encounter: 5\' 1"  (1.549 m).   Weight as of this encounter: 69 kg.   Hypoalbuminemia -Patient's Albumin Trend: Recent Labs  Lab 07/17/22 1900 07/18/22 0038 07/19/22 0849 07/20/22 0744  ALBUMIN 4.3 3.7 3.2* 3.4*  -Continue to Monitor and Trend and repeat CMP in the AM  Consultants: Infectious diseases, interventional radiology, general surgery Procedures performed: Percutaneous cholecystostomy tube Disposition: Home Diet recommendation:  Discharge Diet Orders (From admission, onward)     Start     Ordered   07/20/22 0000  Diet - low sodium heart healthy        07/20/22 1424           Cardiac diet DISCHARGE MEDICATION: Allergies as of 07/20/2022       Reactions   Propofol Nausea And Vomiting   "with hysterectomy years back had post op N/V"   Simvastatin  Other (See Comments)   Gi upset        Medication List     TAKE these medications    acetaminophen 500 MG tablet Commonly known as: TYLENOL Take 1,000 mg by mouth every 6 (six) hours as needed for mild pain.   alendronate 70 MG tablet Commonly known as: FOSAMAX Take 70 mg by mouth once a week. Saturday   amoxicillin-clavulanate 875-125 MG tablet Commonly known as: AUGMENTIN Take 1 tablet by mouth every 12 (twelve) hours for 7 days.   apixaban 2.5 MG Tabs tablet Commonly known as: ELIQUIS Take 1 tablet (2.5 mg total) by mouth 2 (two) times daily.   BD PosiFlush 0.9 % Soln injection Generic drug: sodium chloride flush Instill 5 mL into the drain as needed. Discard extra 5ml from syringe once used   Calcium + D3 600-800 MG-UNIT Tabs Take 1 tablet by mouth daily.   diltiazem 300 MG 24 hr capsule Commonly known as: TIAZAC Take 300 mg by mouth daily.   famotidine 10 MG tablet Commonly known as: PEPCID Take 10 mg by mouth daily as needed for heartburn.   levothyroxine  88 MCG tablet Commonly known as: SYNTHROID Take 88 mcg by mouth every morning.   metoprolol succinate 100 MG 24 hr tablet Commonly known as: TOPROL-XL Take 1 tablet (100 mg total) by mouth 2 (two) times daily. Take with or immediately following a meal.   Myrbetriq 50 MG Tb24 tablet Generic drug: mirabegron ER Take 50 mg by mouth daily.   Nitrostat 0.4 MG SL tablet Generic drug: nitroGLYCERIN Place 1 tablet (0.4 mg total) under the tongue every 5 (five) minutes as needed. Chest pain   ondansetron 4 MG tablet Commonly known as: ZOFRAN Take 1 tablet (4 mg total) by mouth every 6 (six) hours as needed for nausea.   oxyCODONE 5 MG immediate release tablet Commonly known as: Oxy IR/ROXICODONE Take 0.5-1 tablets (2.5-5 mg total) by mouth every 6 (six) hours as needed for severe pain or moderate pain.   Polyethyl Glycol-Propyl Glycol 0.4-0.3 % Soln Place 1-2 drops into both eyes daily as needed (for  dry eye).   rosuvastatin 10 MG tablet Commonly known as: CRESTOR Take 1 tablet by mouth once daily   spironolactone 25 MG tablet Commonly known as: ALDACTONE Take 1 tablet by mouth once daily   Vitamin D3 50 MCG (2000 UT) Tabs Take 2,000 Units by mouth daily with lunch.        Follow-up Information     Emelia Loron, MD Follow up on 09/18/2022.   Specialty: General Surgery Why: 910am. You should see interventional radiology before this appointment. Arrive 30 minutes prior to your appointment for paperwork. Bring a copy of your photo ID and insurance card. Contact information: [address] [place] [address] [phone]         Gilmer Mor, DO Follow up.   Specialties: Interventional Radiology, Radiology Why: IR scheduler will call you to arrange a follow up appointment for your drain in about 6 weeks. You should have this appointment before following up with general surgery. If you have any questions or concerns please call [phone] Monday-Friday between 8a-5p OR [phone] at anytime to be connected with an on call radiologist. Contact information: [address] [place] [address] [phone]                Discharge Exam: Ceasar Mons Weights   07/17/22 1855  Weight: 69 kg   Vitals:   07/20/22 1142 07/20/22 1505  BP: 133/65 125/84  Pulse: 82 74  Resp: 18 18  Temp: 98.1 F (36.7 C) 98.2 F (36.8 C)  SpO2: 96% 93%   Examination: Physical Exam:  Constitutional: WN/WD overweight pleasant Caucasian female in no acute distress Respiratory: Diminished to auscultation bilaterally, no wheezing, rales, rhonchi or crackles. Normal respiratory effort and patient is not tachypenic. No accessory muscle use.  Unlabored breathing Cardiovascular: RRR, no murmurs / rubs / gallops. S1 and S2 auscultated. No extremity edema.  Abdomen: Soft, a little tender to palpate, non-distended.  Has a percutaneous cholecystostomy tube  which is draining extremely well bowel sounds positive.  GU: Deferred. Musculoskeletal: No clubbing / cyanosis of digits/nails. No joint deformity upper and lower extremities.  Skin: No rashes, lesions, ulcers limited skin evaluation. No induration; Warm and dry.  Neurologic: CN 2-12 grossly intact with no focal deficits.  Romberg sign and cerebellar reflexes not assessed.  Psychiatric: Normal judgment and insight. Alert and oriented x 3. Normal mood and appropriate affect.   Condition at discharge: stable  The results of significant diagnostics from this hospitalization (including imaging,  microbiology, ancillary and laboratory) are listed below for reference.   Imaging Studies: IR Perc Cholecystostomy  Result Date: 07/18/2022 INDICATION: 89 year old female presents for percutaneous cholecystostomy EXAM: CHOLECYSTOSTOMY MEDICATIONS: None ANESTHESIA/SEDATION: Moderate (conscious) sedation was employed during this procedure. A total of Versed 1.5 mg and Fentanyl 75 mcg was administered intravenously. Moderate Sedation Time: 17 minutes. The patient's level of consciousness and vital signs were monitored continuously by radiology nursing throughout the procedure under my direct supervision. FLUOROSCOPY TIME:  Fluoroscopy Time: 0 minutes 36 seconds (3 mGy). COMPLICATIONS: None PROCEDURE: Informed written consent was obtained from the patient and the patient's family after a thorough discussion of the procedural risks, benefits and alternatives. All questions were addressed. Maximal Sterile Barrier Technique was utilized including caps, mask, sterile gowns, sterile gloves, sterile drape, hand hygiene and skin antiseptic. A timeout was performed prior to the initiation of the procedure. Ultrasound survey of the right upper quadrant was performed for planning purposes. Once the patient is prepped and draped in the usual sterile fashion, the skin and subcutaneous tissues overlying the gallbladder were  generously infiltrated 1% lidocaine for local anesthesia. A coaxial needle was advanced under ultrasound guidance through the skin subcutaneous tissues and a small segment of liver into the gallbladder lumen. With removal of the stylet, spontaneous dark bile drainage occurred. Using modified Seldinger technique, a 10 French drain was placed into the gallbladder fossa, with aspiration of the sample for the lab. Contrast injection confirmed position of the tube within the gallbladder lumen. Drainage catheter was attached to gravity drain with a suture retention placed. Patient tolerated the procedure well and remained hemodynamically stable throughout. No complications were encountered and no significant blood loss encountered. IMPRESSION: Status post image guided percutaneous cholecystostomy Signed, Yvone Neu. Miachel Roux, RPVI [facility] Electronically Signed   By: Gilmer Mor D.O.   On: 07/18/2022 15:21   CT ABDOMEN PELVIS W CONTRAST  Result Date: 07/17/2022 CLINICAL DATA:  Suspected bowel obstruction EXAM: CT ABDOMEN AND PELVIS WITH CONTRAST TECHNIQUE: Multidetector CT imaging of the abdomen and pelvis was performed using the standard protocol following bolus administration of intravenous contrast. RADIATION DOSE REDUCTION: This exam was performed according to the departmental dose-optimization program which includes automated exposure control, adjustment of the mA and/or kV according to patient size and/or use of iterative reconstruction technique. CONTRAST:  75mL OMNIPAQUE IOHEXOL 350 MG/ML SOLN COMPARISON:  CT 08/02/2018, 07/22/2018 FINDINGS: Lower chest: Lung bases demonstrate no acute airspace disease. Cardiomegaly. Trace right-sided pleural effusion. Hepatobiliary: No focal hepatic abnormality. Multiple gallstones. Abnormal fluid adjacent to the gallbladder either representing Peri coli cystic fluid versus marked gallbladder wall  thickening. No biliary dilatation Pancreas: Unremarkable. No pancreatic ductal dilatation or surrounding inflammatory changes. Spleen: Normal in size without focal abnormality. Adrenals/Urinary Tract: Adrenal glands are within normal limits. Kidneys show no hydronephrosis. Cortical scarring mid pole right kidney. The bladder is unremarkable. Stomach/Bowel: The stomach is nonenlarged. Status post small bowel resection with ileo colic anastomosis in the right lower quadrant. Mild fecalized appearance of small bowel in the region probably due to slow transit. No convincing obstruction. No acute bowel wall thickening. Diverticular disease of the left colon. Vascular/Lymphatic: Mild aortic atherosclerosis. No aneurysm. No suspicious lymph nodes Reproductive: Status post hysterectomy. No adnexal masses. Other: No free air. Small volume free fluid within the abdomen and pelvis. Anterior abdominal wall laxity. Small inferior ventral hernia containing mesenteric fat, small ascites as well as bowel but no obstructive change. Musculoskeletal: Chronic compression  deformity at L1 IMPRESSION: 1. Multiple gallstones. Abnormal fluid adjacent to the gallbladder either representing pericholecystic fluid versus marked gallbladder wall thickening. Recommend correlation with right upper quadrant ultrasound. 2. Status post small bowel resection with ileocolic anastomosis in the right lower quadrant. Mild fecalized appearance of small bowel in the region probably due to slow transit. No convincing evidence for bowel obstruction. 3. Small volume free fluid within the abdomen and pelvis. Diverticular disease of the left colon without acute wall thickening. 4. Cardiomegaly. Trace right-sided pleural effusion. 5. Small inferior ventral hernia containing mesenteric fat, small ascites as well as bowel but no obstructive change. Aortic Atherosclerosis (ICD10-I70.0). Electronically Signed   By: Jasmine Pang M.D.   On: 07/17/2022 19:56     Microbiology: Results for orders placed or performed during the hospital encounter of 07/17/22  Blood culture (routine x 2)     Status: None (Preliminary result)   Collection Time: 07/17/22  8:15 PM   Specimen: BLOOD RIGHT HAND  Result Value Ref Range Status   Specimen Description BLOOD RIGHT HAND  Final   Special Requests   Final    BOTTLES DRAWN AEROBIC AND ANAEROBIC Blood Culture results may not be optimal due to an inadequate volume of blood received in culture bottles   Culture   Final    NO GROWTH 3 DAYS Performed at [facility], [address] [place], [place] [address]    Report Status PENDING  Incomplete  Blood culture (routine x 2)     Status: None (Preliminary result)   Collection Time: 07/17/22  8:15 PM   Specimen: BLOOD  Result Value Ref Range Status   Specimen Description BLOOD LEFT ANTECUBITAL  Final   Special Requests   Final    BOTTLES DRAWN AEROBIC AND ANAEROBIC Blood Culture adequate volume   Culture   Final    NO GROWTH 3 DAYS Performed at [facility], [address] [place], [place] [address]    Report Status PENDING  Incomplete  Aerobic/Anaerobic Culture w Gram Stain (surgical/deep wound)     Status: None (Preliminary result)   Collection Time: 07/18/22  1:04 PM   Specimen: Gallbladder; Bile  Result Value Ref Range Status   Specimen Description GALL BLADDER  Final   Special Requests NONE  Final   Gram Stain   Final    RARE WBC PRESENT, PREDOMINANTLY PMN NO ORGANISMS SEEN    Culture   Final    NO GROWTH 2 DAYS NO ANAEROBES ISOLATED; CULTURE IN PROGRESS FOR 5 DAYS Performed at [facility], [address] [place], [place] [address]    Report Status PENDING  Incomplete   Labs: CBC: Recent Labs  Lab 07/17/22 1900 07/18/22 0038 07/19/22 0849 07/20/22 0744  WBC 12.7* 8.9 9.2 8.3  NEUTROABS 10.3*  --  7.3 6.2  HGB 13.6 12.3 10.9* 11.9*  HCT 42.6 36.8 33.3* 35.7*  MCV 99.1 92.7 93.8 93.9  PLT 231 210 188 193   Basic  Metabolic Panel: Recent Labs  Lab 07/17/22 1900 07/18/22 0038 07/19/22 0849 07/20/22 0744  NA 132* 130* 133* 135  K 5.1 4.6 3.9 3.6  CL 100 100 104 103  CO2 15* 18* 20* 22  GLUCOSE 123* 109* 95 94  BUN 25* 22 11 9   CREATININE 1.20* 1.08* 0.70 0.64  CALCIUM 10.0 9.1 8.4* 8.5*  MG  --   --  1.5* 2.0  PHOS  --   --  2.7 2.9   Liver  Function Tests: Recent Labs  Lab 07/17/22 1900 07/18/22 0038 07/19/22 0849 07/20/22 0744  AST 132* 83* 43* 38  ALT 71* 59* 39 36  ALKPHOS 68 65 51 54  BILITOT 0.9 1.3* 1.1 1.1  PROT 6.8 6.2* 5.6* 6.0*  ALBUMIN 4.3 3.7 3.2* 3.4*   CBG: No results for input(s): "GLUCAP" in the last 168 hours.  Discharge time spent: greater than 30 minutes.  Signed: Marguerita Merles, DO [facility] 07/20/2022

## 2022-07-20 NOTE — TOC Progression Note (Signed)
Transition of Care [facility]) - Progression Note    Patient Details  Name: Rachel Park MRN: [id number] Date of Birth: [birth year]  Transition of Care [facility]) CM/SW Contact  Janae Bridgeman, RN Phone Number: 07/20/2022, 10:49 AM  Clinical Narrative:     Transition of Care [facility]) Screening Note   Patient Details  Name: Rachel Park Date of Birth: 12/19/1931   Transition of Care [facility]) CM/SW Contact:    Janae Bridgeman, RN Phone Number: 07/20/2022, 10:49 AM    Transition of Care Department [facility]) has reviewed patient and no TOC needs have been identified at this time.   We will continue to monitor patient advancement through interdisciplinary progression rounds. If new patient transition needs arise, please place a TOC consult.          Expected Discharge Plan and Services                                               Social Determinants of Health (SDOH) Interventions SDOH Screenings   Food Insecurity: No Food Insecurity (07/17/2022)  Housing: Low Risk  (07/17/2022)  Transportation Needs: No Transportation Needs (07/17/2022)  Utilities: Not At Risk (07/17/2022)  Tobacco Use: Low Risk  (07/18/2022)    Readmission Risk Interventions     No data to display

## 2022-07-20 NOTE — Consult Note (Signed)
[facility]    Date of Admission:  07/17/2022     Total days of antibiotics 3  Zosyn 5/13 >> current               Reason for Consult: Acute Calculous Cholecystitis     Referring Provider: Marland Mcalpine Primary Care Provider: Cleatis Polka., MD    Assessment: Rachel Park is a 89 y.o. female with AFib, CAD, CVA, HTN, Hypothyroid and SBP s/p ileocecectomy in 2020 here with abdominal pain and subjective fevers. Found to have CT evidence of acute calculous cholecystitis. Given previous more complicated bowel surgeries in the past, decision was made to treat with percutaneous tube placement and antibiotics. She has improved significantly now 3d into treatment. No fevers/leukocytosis throughout hospital stay and has remained normotensive. Would transition to oral augmentin to finish out planned 10d course from drain placement. She feels comfortable with discharge today and has zero pain.   Dr. Daiva Eves discussed with her son over the phone.    Plan: Change to Augmentin to complete 10d with EOT: 07/27/22 FU with IR drain clinic and with Gen Surgery for further recommendations after acute cholecystitis subsides.     Principal Problem:   Cholecystitis with cholelithiasis Active Problems:   CAD (coronary artery disease)   Hypothyroidism   Persistent atrial fibrillation/flutter   AKI (acute kidney injury) (HCC)    apixaban  2.5 mg Oral BID   diltiazem  300 mg Oral Daily   levothyroxine  88 mcg Oral Q0600   metoprolol succinate  100 mg Oral BID   rosuvastatin  10 mg Oral Daily   sodium chloride flush  3 mL Intravenous Q12H    HPI: Rachel Park is a 89 y.o. female admitted from home for abdominal pain.   PMHx for AFib on Eliquis, CAD s/p stent to LAD 2016, CVA, HTN, hypothyroidism, SBO s/p SBR and ileocecectomy in 2020 [facility]).   The morning of admission 5/13 she developed abdominal pain and nausea w/o vomiting and normal BMs. This progressed  to involve chills and sweating later the day. She called EMS to bring her to hospital out of concern she may have recurrent SBO.   In the ER she was found to have CT abdomen with multiple gallstones and pericholecystic fluid/thickening. NO bowel obstruction. Left colon diverticular disease but no acute thickening. General surgery consulted and zosyn was started for treatment. With significant abdominal history in the past, decision was made to move forward with perc chole drain. Her condition improved significantly after placement and antibiotic care.  Plan will be to discharge with drain in place and see IR again in 6 weeks for cholangiogram prior to surgical follow up for consideration of cholecystectomy after acute condition resolves.    Review of Systems: Review of Systems  Constitutional:  Negative for chills, diaphoresis and fever.  Respiratory: Negative.    Cardiovascular: Negative.   Gastrointestinal:  Negative for abdominal pain, diarrhea, nausea and vomiting.  Genitourinary: Negative.   Musculoskeletal: Negative.   Skin:  Negative for rash.     Past Medical History:  Diagnosis Date   Arthritis    Complication of anesthesia    Coronary artery disease    Hypertension    Hypothyroidism    PAF (paroxysmal atrial fibrillation) (HCC)    on Xarelto for maybe a year, opted to stop   PONV (postoperative nausea and vomiting)    Renal infarction (HCC)  Small bowel obstruction (HCC) 06/2015   Stroke [facility])    oct 2016   TIA (transient ischemic attack) 03/24/2015   Vertigo, benign positional    to be evaluated, intermittent    Social History   Tobacco Use   Smoking status: Never   Smokeless tobacco: Never  Vaping Use   Vaping Use: Never used  Substance Use Topics   Alcohol use: Not Currently    Comment: occ.   Drug use: No    Family History  Problem Relation Age of Onset   Heart attack Father 42       Died age 79   Stroke Mother    Cancer Sister        breast    Allergies  Allergen Reactions   Propofol Nausea And Vomiting    "with hysterectomy years back had post op N/V"   Simvastatin Other (See Comments)    Gi upset    OBJECTIVE: Blood pressure 133/65, pulse 82, temperature 98.1 F (36.7 C), resp. rate 18, height 5\' 1"  (1.549 m), weight 69 kg, SpO2 96 %.  Physical Exam Vitals reviewed.  Cardiovascular:     Rate and Rhythm: Normal rate and regular rhythm.  Pulmonary:     Effort: Pulmonary effort is normal.     Breath sounds: Normal breath sounds.  Abdominal:     General: There is no distension.     Palpations: Abdomen is soft.     Tenderness: There is no abdominal tenderness.  Neurological:     General: No focal deficit present.     Mental Status: She is alert and oriented to person, place, and time.     Lab Results Lab Results  Component Value Date   WBC 8.3 07/20/2022   HGB 11.9 (L) 07/20/2022   HCT 35.7 (L) 07/20/2022   MCV 93.9 07/20/2022   PLT 193 07/20/2022    Lab Results  Component Value Date   CREATININE 0.64 07/20/2022   BUN 9 07/20/2022   NA 135 07/20/2022   K 3.6 07/20/2022   CL 103 07/20/2022   CO2 22 07/20/2022    Lab Results  Component Value Date   ALT 36 07/20/2022   AST 38 07/20/2022   ALKPHOS 54 07/20/2022   BILITOT 1.1 07/20/2022     Microbiology: Recent Results (from the past 240 hour(s))  Blood culture (routine x 2)     Status: None (Preliminary result)   Collection Time: 07/17/22  8:15 PM   Specimen: BLOOD RIGHT HAND  Result Value Ref Range Status   Specimen Description BLOOD RIGHT HAND  Final   Special Requests   Final    BOTTLES DRAWN AEROBIC AND ANAEROBIC Blood Culture results may not be optimal due to an inadequate volume of blood received in culture bottles   Culture   Final    NO GROWTH 3 DAYS Performed at [facility], [address] [place], [place] [address]    Report Status PENDING  Incomplete  Blood culture (routine x 2)     Status: None (Preliminary result)    Collection Time: 07/17/22  8:15 PM   Specimen: BLOOD  Result Value Ref Range Status   Specimen Description BLOOD LEFT ANTECUBITAL  Final   Special Requests   Final    BOTTLES DRAWN AEROBIC AND ANAEROBIC Blood Culture adequate volume   Culture   Final    NO GROWTH 3 DAYS Performed at [facility], [address], [place], [place] [address]  Report Status PENDING  Incomplete  Aerobic/Anaerobic Culture w Gram Stain (surgical/deep wound)     Status: None (Preliminary result)   Collection Time: 07/18/22  1:04 PM   Specimen: Gallbladder; Bile  Result Value Ref Range Status   Specimen Description GALL BLADDER  Final   Special Requests NONE  Final   Gram Stain   Final    RARE WBC PRESENT, PREDOMINANTLY PMN NO ORGANISMS SEEN    Culture   Final    NO GROWTH 2 DAYS NO ANAEROBES ISOLATED; CULTURE IN PROGRESS FOR 5 DAYS Performed at [facility], [address] [place], [place] [address]    Report Status PENDING  Incomplete     Rexene Alberts, MSN, NP-C [facility]  [email or web address] Pager: [phone] Office: [phone] [facility] Main Line: [phone] *Secure Chat Communication Welcome   Total Encounter Time: 25 m

## 2022-07-20 NOTE — Evaluation (Signed)
Occupational Therapy Evaluation Patient Details Name: Rachel Park MRN: [id number] DOB: 1931-09-04 Today's Date: 07/20/2022   History of Present Illness Patient is a 89 year old female with Acute Cholecystitis s/p percutaneous cholecystostomy tube placement. History of abdominal surgery, persistent A-fib, CAD s/p stent, CVA, HTN   Clinical Impression   PTA, pt lived alone and son drove her to grocery store, appts, etc. Upon eval, pt performing UB and LB Adl with mod I for increased time. Pt reporting more recently she has needed rest breaks during exercise, however, reports no difficulty during daily activity. Reviewed concept of activity pacing and exercising for shorter periods at higher frequency. No further OT needs identified at this time. Recommending mobility specialist to follow to optimize endurance and decrease likelihood of physical decline in the acute setting. Thank you for this order. OT to sign off.      Recommendations for follow up therapy are one component of a multi-disciplinary discharge planning process, led by the attending physician.  Recommendations may be updated based on patient status, additional functional criteria and insurance authorization.   Assistance Recommended at Discharge PRN  Patient can return home with the following Assist for transportation    Functional Status Assessment  Patient has had a recent decline in their functional status and demonstrates the ability to make significant improvements in function in a reasonable and predictable amount of time.  Equipment Recommendations  None recommended by OT    Recommendations for Other Services       Precautions / Restrictions Precautions Precautions: Fall Precaution Comments: drain RUQ Restrictions Weight Bearing Restrictions: No      Mobility Bed Mobility Overal bed mobility: Modified Independent                  Transfers Overall transfer level: Modified independent                         Balance Overall balance assessment: Mild deficits observed, not formally tested (no overt LOB, however, small stride length)                                         ADL either performed or assessed with clinical judgement   ADL                                               Vision Baseline Vision/History: 1 Wears glasses Ability to See in Adequate Light: 0 Adequate Patient Visual Report: No change from baseline Vision Assessment?: No apparent visual deficits Additional Comments: Pt reports double vision when glasses not donned. Prism glasses but does not see as well close up now.     Perception     Praxis      Pertinent Vitals/Pain Pain Assessment Pain Assessment: Faces Faces Pain Scale: Hurts a little bit Pain Location: RUQ; back from arthritis Pain Descriptors / Indicators: Aching, Sore Pain Intervention(s): Limited activity within patient's tolerance, Monitored during session     Hand Dominance Right   Extremity/Trunk Assessment Upper Extremity Assessment Upper Extremity Assessment: Overall WFL for tasks assessed   Lower Extremity Assessment Lower Extremity Assessment: Overall WFL for tasks assessed       Communication Communication Communication: No difficulties   Cognition Arousal/Alertness: Awake/alert Behavior  During Therapy: WFL for tasks assessed/performed Overall Cognitive Status: Within Functional Limits for tasks assessed                                 General Comments: mild memory difficulty, however, recalling iformation shared during her hospital stay and events leading up to today.     General Comments  patient reports overall decreased endurance recently. she has been walking in the neighborhood for exercise but has had to recently lean against mailboxes intermittently due to fatigue. encouraged energy conservation techniques in home setting as well as emphasis on routine,  short distance ambulation for conditioning    Exercises     Shoulder Instructions      Home Living Family/patient expects to be discharged to:: Private residence Living Arrangements: Alone Available Help at Discharge: Family;Available PRN/intermittently Type of Home: House Home Access: Level entry     Home Layout: Able to live on main level with bedroom/bathroom     Bathroom Shower/Tub: Walk-in shower         Home Equipment: Shower seat;Grab bars - tub/shower          Prior Functioning/Environment Prior Level of Function : Independent/Modified Independent             Mobility Comments: does not drive but is independent with ambulation/mobility without assistive device ADLs Comments: has a house cleaner once per month        OT Problem List: Decreased strength;Decreased activity tolerance;Impaired balance (sitting and/or standing)      OT Treatment/Interventions: Self-care/ADL training;Therapeutic exercise;DME and/or AE instruction;Therapeutic activities;Patient/family education;Balance training    OT Goals(Current goals can be found in the care plan section) Acute Rehab OT Goals Patient Stated Goal: be able to walk more OT Goal Formulation: With patient Time For Goal Achievement: 08/03/22 Potential to Achieve Goals: Good  OT Frequency: Min 2X/week    Co-evaluation              AM-PAC OT "6 Clicks" Daily Activity     Outcome Measure Help from another person eating meals?: None Help from another person taking care of personal grooming?: None Help from another person toileting, which includes using toliet, bedpan, or urinal?: None Help from another person bathing (including washing, rinsing, drying)?: None Help from another person to put on and taking off regular upper body clothing?: None Help from another person to put on and taking off regular lower body clothing?: None 6 Click Score: 24   End of Session Nurse Communication: Mobility  status  Activity Tolerance: Patient tolerated treatment well Patient left: in bed;with call bell/phone within reach  OT Visit Diagnosis: Unsteadiness on feet (R26.81);Muscle weakness (generalized) (M62.81)                Time: 1610-9604 OT Time Calculation (min): 24 min Charges:  OT General Charges $OT Visit: 1 Visit OT Evaluation $OT Eval Low Complexity: 1 Low OT Treatments $Self Care/Home Management : 8-22 mins  Tyler Deis, OTR/L [facility] Office: [phone]   Myrla Halsted 07/20/2022, 12:15 PM

## 2022-07-20 NOTE — Evaluation (Signed)
Physical Therapy Evaluation and Discharge  Patient Details Name: Rachel Park MRN: [id number] DOB: [birth year] Today's Date: 07/20/2022  History of Present Illness  Patient is a 89 year old female with Acute Cholecystitis s/p percutaneous cholecystostomy tube placement. History of abdominal surgery, persistent A-fib, CAD s/p stent, CVA, HTN  Clinical Impression  Patient is agreeable to PT evaluation. She is cooperative and eager to go home soon. She lives alone and has supportive family that frequently check on her. She is independent with mobility at baseline and ambulates without an assistive device.  Today the patient is Modified independent with all mobility tasks. Mild RUQ pain reported that does not worsen with activity and is significantly improved since the drain was placed per patient report. The patient ambulated in hallway without device. No loss of balance or shortness of breath is noted. Heart rate 80bpm after walking. Discussed energy conservation techniques and routine, short distance ambulation for conditioning at home. The patient declined the need for home health PT at this time. No apparent acute PT needs at this time as patient is modified independent with mobility.      Recommendations for follow up therapy are one component of a multi-disciplinary discharge planning process, led by the attending physician.  Recommendations may be updated based on patient status, additional functional criteria and insurance authorization.  Follow Up Recommendations       Assistance Recommended at Discharge PRN  Patient can return home with the following  Assist for transportation;Assistance with cooking/housework    Equipment Recommendations None recommended by PT  Recommendations for Other Services       Functional Status Assessment Patient has not had a recent decline in their functional status     Precautions / Restrictions Precautions Precautions: Fall Precaution Comments:  drain RUQ Restrictions Weight Bearing Restrictions: No      Mobility  Bed Mobility Overal bed mobility: Modified Independent             General bed mobility comments: short sitting to supine, Mod I for extra time. no assistance required and no pain reported    Transfers Overall transfer level: Modified independent                      Ambulation/Gait Ambulation/Gait assistance: Supervision, Modified independent (Device/Increase time) Gait Distance (Feet): 120 Feet Assistive device: None Gait Pattern/deviations: Step-through pattern Gait velocity: decreased     General Gait Details: patient ambulated in hallway without assistive device and no loss of balance. heart rate 80bpm after ambulation and no shortness of breath is noted with activity. encouraged routine, short distance ambulation at home for conditioning  Stairs            Wheelchair Mobility    Modified Rankin (Stroke Patients Only)       Balance   Sitting-balance support: Feet supported Sitting balance-Leahy Scale: Good     Standing balance support: No upper extremity supported, During functional activity Standing balance-Leahy Scale: Fair                               Pertinent Vitals/Pain Pain Assessment Pain Assessment: Faces Faces Pain Scale: Hurts a little bit Pain Location: RUQ, she reports pain significantly improved since drain placed    Home Living Family/patient expects to be discharged to:: Private residence Living Arrangements: Alone Available Help at Discharge: Family;Available PRN/intermittently Type of Home: House Home Access: Level entry  Home Layout: Able to live on main level with bedroom/bathroom Home Equipment: Shower seat;Grab bars - tub/shower      Prior Function Prior Level of Function : Independent/Modified Independent             Mobility Comments: does not drive but is independent with ambulation/mobility without assistive  device ADLs Comments: has a house cleaner once per month     Hand Dominance        Extremity/Trunk Assessment   Upper Extremity Assessment Upper Extremity Assessment: Overall WFL for tasks assessed    Lower Extremity Assessment Lower Extremity Assessment: Overall WFL for tasks assessed       Communication   Communication: No difficulties  Cognition Arousal/Alertness: Awake/alert Behavior During Therapy: WFL for tasks assessed/performed Overall Cognitive Status: Within Functional Limits for tasks assessed                                          General Comments General comments (skin integrity, edema, etc.): patient reports overall decreased endurance recently. she has been walking in the neighborhood for exercise but has had to recently lean against mailboxes intermittently due to fatigue. encouraged energy conservation techniques in home setting as well as emphasis on routine, short distance ambulation for conditioning    Exercises     Assessment/Plan    PT Assessment Patient does not need any further PT services  PT Problem List         PT Treatment Interventions      PT Goals (Current goals can be found in the Care Plan section)  Acute Rehab PT Goals PT Goal Formulation: All assessment and education complete, DC therapy    Frequency       Co-evaluation               AM-PAC PT "6 Clicks" Mobility  Outcome Measure Help needed turning from your back to your side while in a flat bed without using bedrails?: None Help needed moving from lying on your back to sitting on the side of a flat bed without using bedrails?: None Help needed moving to and from a bed to a chair (including a wheelchair)?: None Help needed standing up from a chair using your arms (e.g., wheelchair or bedside chair)?: None Help needed to walk in hospital room?: None Help needed climbing 3-5 steps with a railing? : None 6 Click Score: 24    End of Session    Activity Tolerance: Patient tolerated treatment well Patient left: in bed;with call bell/phone within reach Nurse Communication: Mobility status      Time: 1610-9604 PT Time Calculation (min) (ACUTE ONLY): 16 min   Charges:   PT Evaluation $PT Eval Low Complexity: 1 Low          Donna Bernard, PT, MPT   Ina Homes 07/20/2022, 9:23 AM

## 2022-07-20 NOTE — Care Management Important Message (Signed)
Important Message  Patient Details  Name: Rachel Park MRN: [id number] Date of Birth: June 25, 1931   Medicare Important Message Given:  Yes     Dorena Bodo 07/20/2022, 2:28 PM

## 2022-07-20 NOTE — Progress Notes (Signed)
Referring Physician(s): Leary Roca, PA-C/Wakefield, Molli Hazard, MD  Supervising Physician: Roanna Banning  Patient Status:  Rachel Park - In-pt  Chief Complaint: Acute calculous cholecystitis s/p percutaneous cholecystostomy  Subjective:  Patient seen at bedside this morning, she reports abdominal pain has greatly improved since drain placement and rate it ~2/10 currently. She is otherwise feeling well and hopeful to go home today. She lives alone and is planning to take care of the drain herself, although her son will be helping her with appointments/follow up and he can also help her if needed periodically with drain care.   Extensive discussion today regarding purpose of drain, location of drain within her body, what to expect for output, how to flush the drain, how to empty the bag, purpose of stat lock/retention suture and how to monitor them and dressing changes/general drain care. Demonstrated drain care and reviewed supplies being sent home with patient. All questions answered to her satisfaction and she tells me she feels comfortable at this point caring for the drain.   Allergies: Propofol and Simvastatin  Medications: Prior to Admission medications   Medication Sig Start Date End Date Taking? Authorizing Provider  acetaminophen (TYLENOL) 500 MG tablet Take 1,000 mg by mouth every 6 (six) hours as needed for mild pain.    Yes [provider]  alendronate (FOSAMAX) 70 MG tablet Take 70 mg by mouth once a week. Saturday 11/22/20  Yes [provider]  apixaban (ELIQUIS) 2.5 MG TABS tablet Take 1 tablet (2.5 mg total) by mouth 2 (two) times daily. 08/25/19  Yes Marjie Skiff E, PA-C  Calcium Carb-Cholecalciferol (CALCIUM + D3) 600-800 MG-UNIT TABS Take 1 tablet by mouth daily.   Yes [provider]  Cholecalciferol (VITAMIN D3) 50 MCG (2000 UT) TABS Take 2,000 Units by mouth daily with lunch.   Yes [provider]  diltiazem (TIAZAC) 300 MG 24 hr  capsule Take 300 mg by mouth daily. 05/08/22  Yes [provider]  famotidine (PEPCID) 10 MG tablet Take 10 mg by mouth daily as needed for heartburn.   Yes [provider]  levothyroxine (SYNTHROID) 88 MCG tablet Take 88 mcg by mouth every morning. 10/30/19  Yes [provider]  metoprolol succinate (TOPROL-XL) 100 MG 24 hr tablet Take 1 tablet (100 mg total) by mouth 2 (two) times daily. Take with or immediately following a meal. 12/27/20  Yes Nahser, Deloris Ping, MD  MYRBETRIQ 50 MG TB24 tablet Take 50 mg by mouth daily. 05/05/22  Yes [provider]  NITROSTAT 0.4 MG SL tablet Place 1 tablet (0.4 mg total) under the tongue every 5 (five) minutes as needed. Chest pain 06/24/18  Yes Nahser, Deloris Ping, MD  Polyethyl Glycol-Propyl Glycol 0.4-0.3 % SOLN Place 1-2 drops into both eyes daily as needed (for dry eye).   Yes [provider]  rosuvastatin (CRESTOR) 10 MG tablet Take 1 tablet by mouth once daily Patient taking differently: Take 10 mg by mouth daily. 12/26/21  Yes Nahser, Deloris Ping, MD  spironolactone (ALDACTONE) 25 MG tablet Take 1 tablet by mouth once daily Patient taking differently: Take 25 mg by mouth daily. 09/13/21  Yes Bhagat, Sharrell Ku, PA     Vital Signs: BP 138/84 (BP Location: Right Arm)   Pulse 94   Temp 99.2 F (37.3 C)   Resp 18   Ht 5\' 1"  (1.549 m)   Wt 152 lb 1.9 oz (69 kg)   SpO2 94%   BMI 28.74 kg/m   Physical Exam  Vitals and nursing note reviewed.  Constitutional:      General: She is not in acute distress. HENT:     Head: Normocephalic.  Cardiovascular:     Rate and Rhythm: Normal rate.  Pulmonary:     Effort: Pulmonary effort is normal.  Abdominal:     General: There is no distension.     Palpations: Abdomen is soft.     Tenderness: There is abdominal tenderness (mildly TTP over drain insertion site; appropriate).     Comments: (+) RUQ drain with ~100 cc clear, dark bilious output present. No significant blood,  debris or pus noted. Insertion site with small amount of crusted blood, mildly TTP as expected, retention suture and stat lock in tact. No s/s of infection. Drain flushed easily with 5 cc NS.   Skin:    General: Skin is warm and dry.  Neurological:     Mental Status: She is alert. Mental status is at baseline.     Imaging: IR Perc Cholecystostomy  Result Date: 07/18/2022 INDICATION: 89 year old female presents for percutaneous cholecystostomy EXAM: CHOLECYSTOSTOMY MEDICATIONS: None ANESTHESIA/SEDATION: Moderate (conscious) sedation was employed during this procedure. A total of Versed 1.5 mg and Fentanyl 75 mcg was administered intravenously. Moderate Sedation Time: 17 minutes. The patient's level of consciousness and vital signs were monitored continuously by radiology nursing throughout the procedure under my direct supervision. FLUOROSCOPY TIME:  Fluoroscopy Time: 0 minutes 36 seconds (3 mGy). COMPLICATIONS: None PROCEDURE: Informed written consent was obtained from the patient and the patient's family after a thorough discussion of the procedural risks, benefits and alternatives. All questions were addressed. Maximal Sterile Barrier Technique was utilized including caps, mask, sterile gowns, sterile gloves, sterile drape, hand hygiene and skin antiseptic. A timeout was performed prior to the initiation of the procedure. Ultrasound survey of the right upper quadrant was performed for planning purposes. Once the patient is prepped and draped in the usual sterile fashion, the skin and subcutaneous tissues overlying the gallbladder were generously infiltrated 1% lidocaine for local anesthesia. A coaxial needle was advanced under ultrasound guidance through the skin subcutaneous tissues and a small segment of liver into the gallbladder lumen. With removal of the stylet, spontaneous dark bile drainage occurred. Using modified Seldinger technique, a 10 French drain was placed into the gallbladder fossa, with  aspiration of the sample for the lab. Contrast injection confirmed position of the tube within the gallbladder lumen. Drainage catheter was attached to gravity drain with a suture retention placed. Patient tolerated the procedure well and remained hemodynamically stable throughout. No complications were encountered and no significant blood loss encountered. IMPRESSION: Status post image guided percutaneous cholecystostomy Signed, Yvone Neu. Miachel Roux, RPVI [facility] Electronically Signed   By: Gilmer Mor D.O.   On: 07/18/2022 15:21   CT ABDOMEN PELVIS W CONTRAST  Result Date: 07/17/2022 CLINICAL DATA:  Suspected bowel obstruction EXAM: CT ABDOMEN AND PELVIS WITH CONTRAST TECHNIQUE: Multidetector CT imaging of the abdomen and pelvis was performed using the standard protocol following bolus administration of intravenous contrast. RADIATION DOSE REDUCTION: This exam was performed according to the departmental dose-optimization program which includes automated exposure control, adjustment of the mA and/or kV according to patient size and/or use of iterative reconstruction technique. CONTRAST:  75mL OMNIPAQUE IOHEXOL 350 MG/ML SOLN COMPARISON:  CT 08/02/2018, 07/22/2018 FINDINGS: Lower chest: Lung bases demonstrate no acute airspace disease. Cardiomegaly. Trace right-sided pleural effusion. Hepatobiliary: No focal hepatic abnormality. Multiple gallstones. Abnormal fluid adjacent to  the gallbladder either representing Peri coli cystic fluid versus marked gallbladder wall thickening. No biliary dilatation Pancreas: Unremarkable. No pancreatic ductal dilatation or surrounding inflammatory changes. Spleen: Normal in size without focal abnormality. Adrenals/Urinary Tract: Adrenal glands are within normal limits. Kidneys show no hydronephrosis. Cortical scarring mid pole right kidney. The bladder is unremarkable. Stomach/Bowel: The stomach is nonenlarged.  Status post small bowel resection with ileo colic anastomosis in the right lower quadrant. Mild fecalized appearance of small bowel in the region probably due to slow transit. No convincing obstruction. No acute bowel wall thickening. Diverticular disease of the left colon. Vascular/Lymphatic: Mild aortic atherosclerosis. No aneurysm. No suspicious lymph nodes Reproductive: Status post hysterectomy. No adnexal masses. Other: No free air. Small volume free fluid within the abdomen and pelvis. Anterior abdominal wall laxity. Small inferior ventral hernia containing mesenteric fat, small ascites as well as bowel but no obstructive change. Musculoskeletal: Chronic compression deformity at L1 IMPRESSION: 1. Multiple gallstones. Abnormal fluid adjacent to the gallbladder either representing pericholecystic fluid versus marked gallbladder wall thickening. Recommend correlation with right upper quadrant ultrasound. 2. Status post small bowel resection with ileocolic anastomosis in the right lower quadrant. Mild fecalized appearance of small bowel in the region probably due to slow transit. No convincing evidence for bowel obstruction. 3. Small volume free fluid within the abdomen and pelvis. Diverticular disease of the left colon without acute wall thickening. 4. Cardiomegaly. Trace right-sided pleural effusion. 5. Small inferior ventral hernia containing mesenteric fat, small ascites as well as bowel but no obstructive change. Aortic Atherosclerosis (ICD10-I70.0). Electronically Signed   By: Jasmine Pang M.D.   On: 07/17/2022 19:56    Labs:  CBC: Recent Labs    07/17/22 1900 07/18/22 0038 07/19/22 0849 07/20/22 0744  WBC 12.7* 8.9 9.2 8.3  HGB 13.6 12.3 10.9* 11.9*  HCT 42.6 36.8 33.3* 35.7*  PLT 231 210 188 193    COAGS: Recent Labs    07/18/22 1046  INR 1.2    BMP: Recent Labs    07/17/22 1900 07/18/22 0038 07/19/22 0849 07/20/22 0744  NA 132* 130* 133* 135  K 5.1 4.6 3.9 3.6  CL 100 100  104 103  CO2 15* 18* 20* 22  GLUCOSE 123* 109* 95 94  BUN 25* 22 11 9   CALCIUM 10.0 9.1 8.4* 8.5*  CREATININE 1.20* 1.08* 0.70 0.64  GFRNONAA 44* 49* >60 >60    LIVER FUNCTION TESTS: Recent Labs    07/17/22 1900 07/18/22 0038 07/19/22 0849 07/20/22 0744  BILITOT 0.9 1.3* 1.1 1.1  AST 132* 83* 43* 38  ALT 71* 59* 39 36  ALKPHOS 68 65 51 54  PROT 6.8 6.2* 5.6* 6.0*  ALBUMIN 4.3 3.7 3.2* 3.4*    Assessment and Plan:  89 y/o F who presented to the [facility] ED 07/17/22 with abdominal pain, subsequently found to have acute calculous cholecystitis for which she underwent percutaneous cholecystostomy 07/18/22 in IR (Dr. Loreta Ave). Patient seen at bedside this morning, doing well and looking forward to going home.   Drain care teaching performed today by myself at bedside, patient asked appropriate questions and stated understanding. She will be caring for the drain by herself at home with her son helping her intermittently if needed. Additionally reviewed follow up plan and contact information should she have any questions or concerns. Supplies given to patient today including gauze, tegaderm, tape, flushes and additional gravity bags.  Plan: - Drain does not need to be routinely flushed, patient instructed to call us if  she has sudden drop in output or other concerns and we will discuss if it needs to be flushed at that time. Demonstrated flushing several times with patient today in case it is needed - Dressing changes every 2-3 days or earlier if soiled/wet - Record output QD (drain record in AVS) and bring this record to follow up appointment. Discussed that output can vary day to day, however if she has a sudden large increase or decrease in output she should call our office. She was also instructed to call if she notices blood, pus or green colored bile in the bag - May shower with drain, do not submerge - Follow up with IR in 6 weeks for cholangiogram prior to surgical follow up for possible  cholecystectomy. IR schedulers will call patient to set this up - Written drain care instructions and contact information in AVS.  Please call with questions or concerns.  Electronically Signed: Villa Herb, PA-C 07/20/2022, 10:47 AM   I spent a total of 25 Minutes at the the patient's bedside AND on the patient's hospital floor or unit, greater than 50% of which was counseling/coordinating care for cholecystitis

## 2022-07-21 LAB — AEROBIC/ANAEROBIC CULTURE W GRAM STAIN (SURGICAL/DEEP WOUND)

## 2022-07-22 LAB — CULTURE, BLOOD (ROUTINE X 2)

## 2022-07-23 LAB — AEROBIC/ANAEROBIC CULTURE W GRAM STAIN (SURGICAL/DEEP WOUND)

## 2022-07-25 ENCOUNTER — Other Ambulatory Visit (HOSPITAL_COMMUNITY): Payer: Self-pay | Admitting: General Surgery

## 2022-07-25 DIAGNOSIS — K819 Cholecystitis, unspecified: Secondary | ICD-10-CM

## 2022-07-25 DIAGNOSIS — K8000 Calculus of gallbladder with acute cholecystitis without obstruction: Secondary | ICD-10-CM

## 2022-09-01 ENCOUNTER — Ambulatory Visit
Admission: RE | Admit: 2022-09-01 | Discharge: 2022-09-01 | Disposition: A | Discharge status: Home / Self Care | Payer: Medicare HMO | Source: Ambulatory Visit | Attending: Physician Assistant | Admitting: Physician Assistant

## 2022-09-01 ENCOUNTER — Ambulatory Visit
Admission: RE | Admit: 2022-09-01 | Discharge: 2022-09-01 | Disposition: A | Discharge status: Home / Self Care | Payer: Medicare HMO | Source: Ambulatory Visit | Attending: General Surgery | Admitting: General Surgery

## 2022-09-01 DIAGNOSIS — K8000 Calculus of gallbladder with acute cholecystitis without obstruction: Secondary | ICD-10-CM | POA: Diagnosis not present

## 2022-09-01 DIAGNOSIS — K819 Cholecystitis, unspecified: Secondary | ICD-10-CM

## 2022-09-01 HISTORY — PX: IR CHOLANGIOGRAM EXISTING TUBE: IMG6040

## 2022-09-01 NOTE — Progress Notes (Signed)
Chief Complaint: Patient was seen in consultation today for cholecystostomy tube follow-up  Referring Physician(s): Wakefield,Matthew  History of Present Illness: Rachel VANDEVEN is a 89 y.o. female with history of cholecystitis with cholelithiasis.  Based on patient's previous surgical history, general surgery recommended a cholecystostomy tube.  Cholecystostomy tube was placed on 07/18/2022.  Patient improved following cholecystostomy tube placement and was discharged home on 07/20/22 with Augmentin.  Gallbladder fluid did not grow any organisms.  Patient is accompanied by her son Dr. Arbie Cookey.  Patient is doing well at home.  The tube has been draining well.  Patient presents for drain injection.  Past Medical History:  Diagnosis Date   Arthritis    Complication of anesthesia    Coronary artery disease    Hypertension    Hypothyroidism    PAF (paroxysmal atrial fibrillation) (HCC)    on Xarelto for maybe a year, opted to stop   PONV (postoperative nausea and vomiting)    Renal infarction (HCC)    Small bowel obstruction (HCC) 06/2015   Stroke (HCC)    oct 2016   TIA (transient ischemic attack) 03/24/2015   Vertigo, benign positional    to be evaluated, intermittent    Past Surgical History:  Procedure Laterality Date   ABDOMINAL HYSTERECTOMY     APPLICATION OF WOUND VAC N/A 07/23/2018   Procedure: APPLICATION OF WOUND VAC;  Surgeon: Emelia Loron, MD;  [facility];  Service: General;  Laterality: N/A;   BOWEL RESECTION N/A 07/23/2018   Procedure: SMALL BOWEL RESECTION;  Surgeon: Emelia Loron, MD;  Location: [facility] OR;  Service: General;  Laterality: N/A;   CARDIAC CATHETERIZATION N/A 12/24/2014   Procedure: Left Heart Cath and Coronary Angiography;  Surgeon: Tonny Bollman, MD;  Location: [facility] INVASIVE CV LAB;  Service: Cardiovascular;  Laterality: N/A;   CARDIOVERSION N/A 05/22/2018   Procedure: CARDIOVERSION;  Surgeon: Wendall Stade, MD;  Location: [facility] ENDOSCOPY;   Service: Cardiovascular;  Laterality: N/A;   CATARACT EXTRACTION Bilateral    COLONOSCOPY WITH PROPOFOL N/A 12/01/2013   Procedure: COLONOSCOPY WITH PROPOFOL;  Surgeon: Charolett Bumpers, MD;  Location: [facility] ENDOSCOPY;  Service: Endoscopy;  Laterality: N/A;   COMPLEX WOUND CLOSURE  07/25/2018   Procedure: Complex Wound Closure;  Surgeon: Emelia Loron, MD;  Location: [facility] OR;  Service: General;;   Ferne Coe  07/25/2018   Procedure: Ileocecetomy;  Surgeon: Emelia Loron, MD;  Location: [facility] OR;  Service: General;;   INCISIONAL HERNIA REPAIR N/A 03/10/2016   Procedure: INCISIONAL HERNIA REPAIR TIMES TWO;  Surgeon: Darnell Level, MD;  Location: [facility] OR;  Service: General;  Laterality: N/A;   INGUINAL HERNIA REPAIR Bilateral 03/10/2016   Procedure: BILATERAL INGUINAL HERNIA REPAIRS;  Surgeon: Darnell Level, MD;  Location: [facility] OR;  Service: General;  Laterality: Bilateral;   INSERTION OF MESH N/A 03/10/2016   Procedure: INSERTION OF MESH TO BILATERAL GROINS AND ABDOMEN;  Surgeon: Darnell Level, MD;  Location: [facility] OR;  Service: General;  Laterality: N/A;   IR PERC CHOLECYSTOSTOMY  07/18/2022   laparotomy  06/2015   LAPAROTOMY N/A 07/23/2018   Procedure: EXPLORATORY LAPAROTOMY;  Surgeon: Emelia Loron, MD;  Location: [facility] OR;  Service: General;  Laterality: N/A;   LEFT HEART CATH AND CORONARY ANGIOGRAPHY N/A 09/27/2016   Procedure: Left Heart Cath and Coronary Angiography;  Surgeon: Tonny Bollman, MD;  Location: [facility] INVASIVE CV LAB;  Service: Cardiovascular;  Laterality: N/A;   TONSILLECTOMY      Allergies: Propofol and Simvastatin  Medications: Prior  to Admission medications   Medication Sig Start Date End Date Taking? Authorizing Provider  acetaminophen (TYLENOL) 500 MG tablet Take 1,000 mg by mouth every 6 (six) hours as needed for mild pain.     [provider]  alendronate (FOSAMAX) 70 MG tablet Take 70 mg by mouth once a week. Saturday 11/22/20   [provider]  apixaban (ELIQUIS)  2.5 MG TABS tablet Take 1 tablet (2.5 mg total) by mouth 2 (two) times daily. 08/25/19   Corrin Parker, PA-C  Calcium Carb-Cholecalciferol (CALCIUM + D3) 600-800 MG-UNIT TABS Take 1 tablet by mouth daily.    [provider]  Cholecalciferol (VITAMIN D3) 50 MCG (2000 UT) TABS Take 2,000 Units by mouth daily with lunch.    [provider]  diltiazem (TIAZAC) 300 MG 24 hr capsule Take 300 mg by mouth daily. 05/08/22   [provider]  famotidine (PEPCID) 10 MG tablet Take 10 mg by mouth daily as needed for heartburn.    [provider]  levothyroxine (SYNTHROID) 88 MCG tablet Take 88 mcg by mouth every morning. 10/30/19   [provider]  metoprolol succinate (TOPROL-XL) 100 MG 24 hr tablet Take 1 tablet (100 mg total) by mouth 2 (two) times daily. Take with or immediately following a meal. 12/27/20   Nahser, Deloris Ping, MD  MYRBETRIQ 50 MG TB24 tablet Take 50 mg by mouth daily. 05/05/22   [provider]  NITROSTAT 0.4 MG SL tablet Place 1 tablet (0.4 mg total) under the tongue every 5 (five) minutes as needed. Chest pain 06/24/18   Nahser, Deloris Ping, MD  ondansetron (ZOFRAN) 4 MG tablet Take 1 tablet (4 mg total) by mouth every 6 (six) hours as needed for nausea. 07/20/22   Marguerita Merles Latif, DO  oxyCODONE (OXY IR/ROXICODONE) 5 MG immediate release tablet Take 0.5-1 tablets (2.5-5 mg total) by mouth every 6 (six) hours as needed for severe pain or moderate pain. 07/20/22   Marguerita Merles Latif, DO  Polyethyl Glycol-Propyl Glycol 0.4-0.3 % SOLN Place 1-2 drops into both eyes daily as needed (for dry eye).    [provider]  rosuvastatin (CRESTOR) 10 MG tablet Take 1 tablet by mouth once daily Patient taking differently: Take 10 mg by mouth daily. 12/26/21   Nahser, Deloris Ping, MD  sodium chloride flush 0.9 % SOLN injection Instill 5 mL into the drain as needed. Discard extra 5ml from syringe once used 07/20/22   Marguerita Merles Latif, DO   spironolactone (ALDACTONE) 25 MG tablet Take 1 tablet by mouth once daily Patient taking differently: Take 25 mg by mouth daily. 09/13/21   Manson Passey, PA     Family History  Problem Relation Age of Onset   Heart attack Father 42       Died age 33   Stroke Mother    Cancer Sister        breast    Social History   Socioeconomic History   Marital status: Divorced    Spouse name: Not on file   Number of children: 2   Years of education: HS   Highest education level: Not on file  Occupational History   Occupation: retired  Tobacco Use   Smoking status: Never   Smokeless tobacco: Never  Vaping Use   Vaping Use: Never used  Substance and Sexual Activity   Alcohol use: Not Currently    Comment: occ.   Drug use: No   Sexual activity: Not on file  Other  Topics Concern   Not on file  Social History Narrative   Lives alone.  Widow.  Two children   Social Determinants of Health   Financial Resource Strain: Not on file  Food Insecurity: No Food Insecurity (07/17/2022)   Hunger Vital Sign    Worried About Running Out of Food in the Last Year: Never true    Ran Out of Food in the Last Year: Never true  Transportation Needs: No Transportation Needs (07/17/2022)   PRAPARE - Administrator, Civil Service (Medical): No    Lack of Transportation (Non-Medical): No  Physical Activity: Not on file  Stress: Not on file  Social Connections: Not on file     Review of Systems  Gastrointestinal:  Negative for abdominal pain.    Vital Signs: There were no vitals taken for this visit.   Physical Exam Constitutional:      Appearance: Normal appearance. She is not ill-appearing.  Abdominal:     General: Abdomen is flat.     Palpations: Abdomen is soft.     Comments: Cholecystostomy tube intact.  Bilious fluid in bag.  Retention suture intact with surrounding granulation tissue but no bleeding.  Neurological:     Mental Status: She is alert.         Imaging: Drain injection confirms placement in the gallbladder with stones.  Minimal filling of cystic duct and no drainage into the CBD.    Labs:  CBC: Recent Labs    07/17/22 1900 07/18/22 0038 07/19/22 0849 07/20/22 0744  WBC 12.7* 8.9 9.2 8.3  HGB 13.6 12.3 10.9* 11.9*  HCT 42.6 36.8 33.3* 35.7*  PLT 231 210 188 193    COAGS: Recent Labs    07/18/22 1046  INR 1.2    BMP: Recent Labs    07/17/22 1900 07/18/22 0038 07/19/22 0849 07/20/22 0744  NA 132* 130* 133* 135  K 5.1 4.6 3.9 3.6  CL 100 100 104 103  CO2 15* 18* 20* 22  GLUCOSE 123* 109* 95 94  BUN 25* 22 11 9   CALCIUM 10.0 9.1 8.4* 8.5*  CREATININE 1.20* 1.08* 0.70 0.64  GFRNONAA 44* 49* >60 >60    LIVER FUNCTION TESTS: Recent Labs    07/17/22 1900 07/18/22 0038 07/19/22 0849 07/20/22 0744  BILITOT 0.9 1.3* 1.1 1.1  AST 132* 83* 43* 38  ALT 71* 59* 39 36  ALKPHOS 68 65 51 54  PROT 6.8 6.2* 5.6* 6.0*  ALBUMIN 4.3 3.7 3.2* 3.4*    TUMOR MARKERS: No results for input(s): "AFPTM", "CEA", "CA199", "CHROMGRNA" in the last 8760 hours.  Assessment and Plan:  89 year old with history of cholecystitis with cholelithiasis.  Patient was treated with antibiotics and percutaneous cholecystostomy tube.  Patient is currently doing well and tolerating the tube.  Drain injection confirms that the cholecystostomy tube is well-positioned in the gallbladder but there is no drainage into the common bile duct and there appears to be cystic duct obstruction.  As a result, we will keep the drain attached to a gravity bag.  A new StatLock and dressing was placed.  The cholecystostomy tube is functioning well right now and plan for routine drain exchange in 4 to 6 weeks.   Electronically Signed: Arn Medal 09/01/2022, 2:12 PM   I spent a total of    10 Minutes in face to face in clinical consultation, greater than 50% of which was counseling/coordinating care for a cholecystostomy tube. Patient ID: Lavone Nian,  female  DOB: 1931-10-19, 89 y.o.   MRN: [id number]

## 2022-09-11 ENCOUNTER — Other Ambulatory Visit (HOSPITAL_COMMUNITY): Payer: Self-pay | Admitting: General Surgery

## 2022-09-11 DIAGNOSIS — K8000 Calculus of gallbladder with acute cholecystitis without obstruction: Secondary | ICD-10-CM

## 2022-09-11 DIAGNOSIS — K819 Cholecystitis, unspecified: Secondary | ICD-10-CM

## 2022-10-02 ENCOUNTER — Ambulatory Visit (HOSPITAL_COMMUNITY)
Admission: RE | Admit: 2022-10-02 | Discharge: 2022-10-02 | Disposition: A | Discharge status: Home / Self Care | Payer: Medicare HMO | Source: Ambulatory Visit | Attending: General Surgery | Admitting: General Surgery

## 2022-10-02 ENCOUNTER — Other Ambulatory Visit (HOSPITAL_COMMUNITY): Payer: Self-pay | Admitting: General Surgery

## 2022-10-02 DIAGNOSIS — K8000 Calculus of gallbladder with acute cholecystitis without obstruction: Secondary | ICD-10-CM

## 2022-10-02 DIAGNOSIS — K8020 Calculus of gallbladder without cholecystitis without obstruction: Secondary | ICD-10-CM | POA: Diagnosis not present

## 2022-10-02 DIAGNOSIS — K819 Cholecystitis, unspecified: Secondary | ICD-10-CM

## 2022-10-02 DIAGNOSIS — Z434 Encounter for attention to other artificial openings of digestive tract: Secondary | ICD-10-CM | POA: Diagnosis not present

## 2022-10-02 HISTORY — PX: IR EXCHANGE BILIARY DRAIN: IMG6046

## 2022-10-02 MED ORDER — LIDOCAINE HCL 1 % IJ SOLN
INTRAMUSCULAR | Status: AC
  Filled 2022-10-02: qty 20

## 2022-10-02 MED ORDER — IOHEXOL 300 MG/ML  SOLN
50.0000 mL | Freq: Once | INTRAMUSCULAR | Status: AC | PRN
  Administered 2022-10-02: 10 mL

## 2022-10-02 MED ORDER — LIDOCAINE HCL 1 % IJ SOLN
10.0000 mL | Freq: Once | INTRAMUSCULAR | Status: AC
  Administered 2022-10-02: 10 mL

## 2022-10-02 MED ORDER — IOHEXOL 300 MG/ML  SOLN: 50.0000 mL | Freq: Once | INTRAMUSCULAR | Status: DC | PRN

## 2022-10-09 ENCOUNTER — Other Ambulatory Visit (HOSPITAL_COMMUNITY): Payer: Self-pay | Admitting: General Surgery

## 2022-10-09 DIAGNOSIS — K8000 Calculus of gallbladder with acute cholecystitis without obstruction: Secondary | ICD-10-CM

## 2022-10-09 DIAGNOSIS — K819 Cholecystitis, unspecified: Secondary | ICD-10-CM

## 2022-10-16 ENCOUNTER — Telehealth: Payer: Self-pay | Admitting: *Deleted

## 2022-10-16 ENCOUNTER — Ambulatory Visit
Admission: RE | Admit: 2022-10-16 | Discharge: 2022-10-16 | Disposition: A | Discharge status: Home / Self Care | Payer: Medicare HMO | Source: Ambulatory Visit | Attending: General Surgery | Admitting: General Surgery

## 2022-10-16 DIAGNOSIS — A419 Sepsis, unspecified organism: Secondary | ICD-10-CM | POA: Diagnosis not present

## 2022-10-16 DIAGNOSIS — K819 Cholecystitis, unspecified: Secondary | ICD-10-CM

## 2022-10-16 DIAGNOSIS — K8000 Calculus of gallbladder with acute cholecystitis without obstruction: Secondary | ICD-10-CM

## 2022-10-16 DIAGNOSIS — K8080 Other cholelithiasis without obstruction: Secondary | ICD-10-CM | POA: Diagnosis not present

## 2022-10-16 HISTORY — PX: IR RADIOLOGIST EVAL & MGMT: IMG5224

## 2022-10-16 NOTE — Telephone Encounter (Signed)
   Pre-operative Risk Assessment    Patient Name: Rachel Park  DOB: 1931/08/28 MRN: [id number]      Request for Surgical Clearance    Procedure:   SPYGLASS GALLSTONE RETRIEVAL   Date of Surgery:  Clearance 11/13/22                                 Surgeon:  DR. Elby Showers Surgeon's Group or Practice Name:  [facility] Phone number:  [phone] Fax number:  [phone]   Type of Clearance Requested:   - Medical  - Pharmacy:  Hold Apixaban (Eliquis) x 2 DAYS PRIOR   Type of Anesthesia:  Not Indicated   Additional requests/questions:    Elpidio Anis   10/16/2022, 1:20 PM

## 2022-10-16 NOTE — Progress Notes (Signed)
Chief Complaint: Patient was seen in consultation today for cholelithiasis with indwelling cholecystostomy tube  Referring Physician(s): Wakefield,Matthew  History of Present Illness: Rachel Park is a 89 y.o. female with history of acute calculous cholecystitis who due to multiple comorbidities including age, atrial fibrillation, history of TIA, was deemed a poor surgical candidate and underwent percutaneous cholecystostomy tube placement by Dr. Loreta Ave on 07/18/22.  She has followed up with Dr. Dwain Sarna to consider surgical options and is still a poor surgical candidate therefore presents today, alongside her son, Dr. Tawanna Cooler Early,  to discuss percutaneous gallstone retrieval.  She has had no problems with the drain, but is hopeful to have it removed.  No recent fevers, chills, abdominal pain, change in bowel habits, or change in bilious output from the drain.  She has never had appreciable hemobilia.    Past Medical History:  Diagnosis Date   Arthritis    Complication of anesthesia    Coronary artery disease    Hypertension    Hypothyroidism    PAF (paroxysmal atrial fibrillation) (HCC)    on Xarelto for maybe a year, opted to stop   PONV (postoperative nausea and vomiting)    Renal infarction (HCC)    Small bowel obstruction (HCC) 06/2015   Stroke (HCC)    oct 2016   TIA (transient ischemic attack) 03/24/2015   Vertigo, benign positional    to be evaluated, intermittent    Past Surgical History:  Procedure Laterality Date   ABDOMINAL HYSTERECTOMY     APPLICATION OF WOUND VAC N/A 07/23/2018   Procedure: APPLICATION OF WOUND VAC;  Surgeon: Emelia Loron, MD;  Location: [facility] OR;  Service: General;  Laterality: N/A;   BOWEL RESECTION N/A 07/23/2018   Procedure: SMALL BOWEL RESECTION;  Surgeon: Emelia Loron, MD;  Location: [facility] OR;  Service: General;  Laterality: N/A;   CARDIAC CATHETERIZATION N/A 12/24/2014   Procedure: Left Heart Cath and Coronary Angiography;  Surgeon:  Tonny Bollman, MD;  Location: [facility] INVASIVE CV LAB;  Service: Cardiovascular;  Laterality: N/A;   CARDIOVERSION N/A 05/22/2018   Procedure: CARDIOVERSION;  Surgeon: Wendall Stade, MD;  Location: [facility] ENDOSCOPY;  Service: Cardiovascular;  Laterality: N/A;   CATARACT EXTRACTION Bilateral    COLONOSCOPY WITH PROPOFOL N/A 12/01/2013   Procedure: COLONOSCOPY WITH PROPOFOL;  Surgeon: Charolett Bumpers, MD;  Location: [facility] ENDOSCOPY;  Service: Endoscopy;  Laterality: N/A;   COMPLEX WOUND CLOSURE  07/25/2018   Procedure: Complex Wound Closure;  Surgeon: Emelia Loron, MD;  Location: [facility] OR;  Service: General;;   Ferne Coe  07/25/2018   Procedure: Ileocecetomy;  Surgeon: Emelia Loron, MD;  Location: [facility] OR;  Service: General;;   INCISIONAL HERNIA REPAIR N/A 03/10/2016   Procedure: INCISIONAL HERNIA REPAIR TIMES TWO;  Surgeon: Darnell Level, MD;  Location: [facility] OR;  Service: General;  Laterality: N/A;   INGUINAL HERNIA REPAIR Bilateral 03/10/2016   Procedure: BILATERAL INGUINAL HERNIA REPAIRS;  Surgeon: Darnell Level, MD;  Location: [facility] OR;  Service: General;  Laterality: Bilateral;   INSERTION OF MESH N/A 03/10/2016   Procedure: INSERTION OF MESH TO BILATERAL GROINS AND ABDOMEN;  Surgeon: Darnell Level, MD;  Location: [facility] OR;  Service: General;  Laterality: N/A;   IR CHOLANGIOGRAM EXISTING TUBE  09/01/2022   IR EXCHANGE BILIARY DRAIN  10/02/2022   IR PERC CHOLECYSTOSTOMY  07/18/2022   laparotomy  06/2015   LAPAROTOMY N/A 07/23/2018   Procedure: EXPLORATORY LAPAROTOMY;  Surgeon: Emelia Loron, MD;  Location: [facility] OR;  Service: General;  Laterality:  N/A;   LEFT HEART CATH AND CORONARY ANGIOGRAPHY N/A 09/27/2016   Procedure: Left Heart Cath and Coronary Angiography;  Surgeon: Tonny Bollman, MD;  Location: [facility] INVASIVE CV LAB;  Service: Cardiovascular;  Laterality: N/A;   TONSILLECTOMY      Allergies: Propofol and Simvastatin  Medications: Prior to Admission medications   Medication Sig Start Date End Date  Taking? Authorizing Provider  acetaminophen (TYLENOL) 500 MG tablet Take 1,000 mg by mouth every 6 (six) hours as needed for mild pain.     [provider]  alendronate (FOSAMAX) 70 MG tablet Take 70 mg by mouth once a week. Saturday 11/22/20   [provider]  apixaban (ELIQUIS) 2.5 MG TABS tablet Take 1 tablet (2.5 mg total) by mouth 2 (two) times daily. 08/25/19   Corrin Parker, PA-C  Calcium Carb-Cholecalciferol (CALCIUM + D3) 600-800 MG-UNIT TABS Take 1 tablet by mouth daily.    [provider]  Cholecalciferol (VITAMIN D3) 50 MCG (2000 UT) TABS Take 2,000 Units by mouth daily with lunch.    [provider]  diltiazem (TIAZAC) 300 MG 24 hr capsule Take 300 mg by mouth daily. 05/08/22   [provider]  famotidine (PEPCID) 10 MG tablet Take 10 mg by mouth daily as needed for heartburn.    [provider]  levothyroxine (SYNTHROID) 88 MCG tablet Take 88 mcg by mouth every morning. 10/30/19   [provider]  metoprolol succinate (TOPROL-XL) 100 MG 24 hr tablet Take 1 tablet (100 mg total) by mouth 2 (two) times daily. Take with or immediately following a meal. 12/27/20   Nahser, Deloris Ping, MD  MYRBETRIQ 50 MG TB24 tablet Take 50 mg by mouth daily. 05/05/22   [provider]  NITROSTAT 0.4 MG SL tablet Place 1 tablet (0.4 mg total) under the tongue every 5 (five) minutes as needed. Chest pain 06/24/18   Nahser, Deloris Ping, MD  ondansetron (ZOFRAN) 4 MG tablet Take 1 tablet (4 mg total) by mouth every 6 (six) hours as needed for nausea. 07/20/22   Marguerita Merles Latif, DO  oxyCODONE (OXY IR/ROXICODONE) 5 MG immediate release tablet Take 0.5-1 tablets (2.5-5 mg total) by mouth every 6 (six) hours as needed for severe pain or moderate pain. 07/20/22   Marguerita Merles Latif, DO  Polyethyl Glycol-Propyl Glycol 0.4-0.3 % SOLN Place 1-2 drops into both eyes daily as needed (for dry eye).    [provider]  rosuvastatin (CRESTOR) 10 MG  tablet Take 1 tablet by mouth once daily Patient taking differently: Take 10 mg by mouth daily. 12/26/21   Nahser, Deloris Ping, MD  sodium chloride flush 0.9 % SOLN injection Instill 5 mL into the drain as needed. Discard extra 5ml from syringe once used 07/20/22   Marguerita Merles Latif, DO  spironolactone (ALDACTONE) 25 MG tablet Take 1 tablet by mouth once daily Patient taking differently: Take 25 mg by mouth daily. 09/13/21   Manson Passey, PA     Family History  Problem Relation Age of Onset   Heart attack Father 57       Died age 16   Stroke Mother    Cancer Sister        breast    Social History   Socioeconomic History   Marital status: Divorced    Spouse name: Not on file   Number of children: 2   Years of education: HS   Highest education level: Not on file  Occupational History   Occupation: retired  Tobacco Use   Smoking status: Never   Smokeless tobacco: Never  Vaping Use   Vaping status: Never Used  Substance and Sexual Activity   Alcohol use: Not Currently    Comment: occ.   Drug use: No   Sexual activity: Not on file  Other Topics Concern   Not on file  Social History Narrative   Lives alone.  Widow.  Two children   Social Determinants of Health   Financial Resource Strain: Not on file  Food Insecurity: No Food Insecurity (07/17/2022)   Hunger Vital Sign    Worried About Running Out of Food in the Last Year: Never true    Ran Out of Food in the Last Year: Never true  Transportation Needs: No Transportation Needs (07/17/2022)   PRAPARE - Administrator, Civil Service (Medical): No    Lack of Transportation (Non-Medical): No  Physical Activity: Not on file  Stress: Not on file  Social Connections: Not on file    Review of Systems: A 12 point ROS discussed and pertinent positives are indicated in the HPI above.  All other systems are negative.   Vital Signs: There were no vitals taken for this visit.  Advance Care Plan: The advanced  care plan/surrogate decision maker was discussed at the time of visit and documented in the medical record.    Physical Exam Constitutional:      General: She is not in acute distress. HENT:     Head: Normocephalic.     Mouth/Throat:     Mouth: Mucous membranes are moist.  Eyes:     General: No scleral icterus. Cardiovascular:     Rate and Rhythm: Normal rate.  Pulmonary:     Effort: Pulmonary effort is normal.  Abdominal:     General: There is no distension.  Musculoskeletal:     Right lower leg: No edema.     Left lower leg: No edema.  Skin:    General: Skin is warm and dry.     Coloration: Skin is not jaundiced.  Neurological:     Mental Status: She is alert and oriented to person, place, and time.     Imaging: CT AP 07/17/22   10/02/22  10 Fr  Labs:  CBC: Recent Labs    07/17/22 1900 07/18/22 0038 07/19/22 0849 07/20/22 0744  WBC 12.7* 8.9 9.2 8.3  HGB 13.6 12.3 10.9* 11.9*  HCT 42.6 36.8 33.3* 35.7*  PLT 231 210 188 193    COAGS: Recent Labs    07/18/22 1046  INR 1.2    BMP: Recent Labs    07/17/22 1900 07/18/22 0038 07/19/22 0849 07/20/22 0744  NA 132* 130* 133* 135  K 5.1 4.6 3.9 3.6  CL 100 100 104 103  CO2 15* 18* 20* 22  GLUCOSE 123* 109* 95 94  BUN 25* 22 11 9   CALCIUM 10.0 9.1 8.4* 8.5*  CREATININE 1.20* 1.08* 0.70 0.64  GFRNONAA 44* 49* >60 >60    LIVER FUNCTION TESTS: Recent Labs    07/17/22 1900 07/18/22 0038 07/19/22 0849 07/20/22 0744  BILITOT 0.9 1.3* 1.1 1.1  AST 132* 83* 43* 38  ALT 71* 59* 39 36  ALKPHOS 68 65 51 54  PROT 6.8 6.2* 5.6* 6.0*  ALBUMIN 4.3 3.7 3.2* 3.4*    TUMOR MARKERS: No results for input(s): "AFPTM", "CEA", "CA199", "CHROMGRNA" in the last 8760 hours.  Assessment and Plan: 89 year old female with history of acute calculous cholecystitis managed by  percutaneous cholecystostomy tube placement on 07/18/22.  She remains a poor surgical candidate.  She would be an excellent candidate for  cholangioscopic-assisted percutaneous gallstone retrieval with hopes of eventual cholecystostomy tube removal.    Risks and benefits discussed with the patient including bleeding, infection, damage to adjacent structures, bowel perforation/fistula connection, and sepsis.  We discussed that the drain will be larger after the procedure and that she will require having the drain in place for at least 1 month after, depending on stone yield and patency of the cystic duct.  -plan for cholangioscopic (Spyglass) assisted percutaneous gallstone retrieval and cholecystotomy drain exchange with moderate sedation at [facility] -hold eliquis 2 days prior to procedure    Electronically Signed: Bennie Dallas, MD 10/16/2022, 7:38 AM   I spent a total of  30 Minutes  in face to face in clinical consultation, greater than 50% of which was counseling/coordinating care for history of calculous cholecystitis.

## 2022-10-17 ENCOUNTER — Other Ambulatory Visit (HOSPITAL_COMMUNITY): Payer: Self-pay | Admitting: Interventional Radiology

## 2022-10-17 DIAGNOSIS — K819 Cholecystitis, unspecified: Secondary | ICD-10-CM

## 2022-10-17 DIAGNOSIS — K8000 Calculus of gallbladder with acute cholecystitis without obstruction: Secondary | ICD-10-CM

## 2022-10-17 NOTE — Telephone Encounter (Signed)
   Patient Name: DORITA RINGOR  DOB: [birth year] MRN: [id number]  Primary Cardiologist: Kristeen Miss, MD  Chart reviewed as part of pre-operative protocol coverage. Pre-op clearance already addressed by colleagues in earlier phone notes. To summarize recommendations:   Ms. Levy is at low risk for her upcoming gall bladder procedure Agree that she may hold her Eliquis for 2 days prior to procedure Restart as soon as possible after the procedure  -Per Dr. Elease Hashimoto  Will route this bundled recommendation to requesting provider via Epic fax function and remove from pre-op pool. Please call with questions.  Jonita Albee, PA-C 10/17/2022, 10:35 AM

## 2022-10-17 NOTE — Telephone Encounter (Signed)
Patient with diagnosis of afib/flutter on Eliquis for anticoagulation.    Procedure: spyglass gallstone retrieval Date of procedure: 11/13/22  CHA2DS2-VASc Score = 8  This indicates a 10.8% annual risk of stroke. The patient's score is based upon: CHF History: 1 HTN History: 1 Diabetes History: 0 Stroke History: 2 Vascular Disease History: 1 Age Score: 2 Gender Score: 1   CVA 2016, complication from cath TIA 2017 Renal infarct 2019 DVT 2021  CrCl 93mL/min Platelet count 193K  Request is to hold Eliquis for 2 days prior to procedure. Will confirm with MD given elevated CHADS2VASc score of 8, prior DVT and renal infarct.  **This guidance is not considered finalized until pre-operative APP has relayed final recommendations.**

## 2022-11-02 ENCOUNTER — Other Ambulatory Visit: Payer: Self-pay | Admitting: Radiology

## 2022-11-02 DIAGNOSIS — K8080 Other cholelithiasis without obstruction: Secondary | ICD-10-CM

## 2022-11-03 ENCOUNTER — Other Ambulatory Visit (HOSPITAL_COMMUNITY): Payer: Self-pay | Admitting: Interventional Radiology

## 2022-11-03 ENCOUNTER — Other Ambulatory Visit: Payer: Self-pay

## 2022-11-03 ENCOUNTER — Ambulatory Visit (HOSPITAL_COMMUNITY)
Admission: RE | Admit: 2022-11-03 | Discharge: 2022-11-03 | Disposition: A | Discharge status: Home / Self Care | Payer: Medicare HMO | Source: Ambulatory Visit | Attending: Interventional Radiology | Admitting: Interventional Radiology

## 2022-11-03 DIAGNOSIS — K819 Cholecystitis, unspecified: Secondary | ICD-10-CM

## 2022-11-03 DIAGNOSIS — I480 Paroxysmal atrial fibrillation: Secondary | ICD-10-CM | POA: Insufficient documentation

## 2022-11-03 DIAGNOSIS — I10 Essential (primary) hypertension: Secondary | ICD-10-CM | POA: Insufficient documentation

## 2022-11-03 DIAGNOSIS — Z8673 Personal history of transient ischemic attack (TIA), and cerebral infarction without residual deficits: Secondary | ICD-10-CM | POA: Diagnosis not present

## 2022-11-03 DIAGNOSIS — K8000 Calculus of gallbladder with acute cholecystitis without obstruction: Secondary | ICD-10-CM

## 2022-11-03 DIAGNOSIS — K8080 Other cholelithiasis without obstruction: Secondary | ICD-10-CM

## 2022-11-03 DIAGNOSIS — I2510 Atherosclerotic heart disease of native coronary artery without angina pectoris: Secondary | ICD-10-CM | POA: Diagnosis not present

## 2022-11-03 DIAGNOSIS — Z434 Encounter for attention to other artificial openings of digestive tract: Secondary | ICD-10-CM | POA: Diagnosis not present

## 2022-11-03 HISTORY — PX: IR EXCHANGE BILIARY DRAIN: IMG6046

## 2022-11-03 HISTORY — PX: IR REMOVAL OF CALCULI/DEBRIS BILIARY DUCT/GB: IMG6054

## 2022-11-03 LAB — CBC
HCT: 36.5 % (ref 36.0–46.0)
Hemoglobin: 11.9 g/dL — ABNORMAL LOW (ref 12.0–15.0)
MCH: 30.4 pg (ref 26.0–34.0)
MCHC: 32.6 g/dL (ref 30.0–36.0)
MCV: 93.1 fL (ref 80.0–100.0)
Platelets: 241 10*3/uL (ref 150–400)
RBC: 3.92 MIL/uL (ref 3.87–5.11)
RDW: 14 % (ref 11.5–15.5)
WBC: 7.2 10*3/uL (ref 4.0–10.5)
nRBC: 0 % (ref 0.0–0.2)

## 2022-11-03 LAB — BASIC METABOLIC PANEL
Anion gap: 11 (ref 5–15)
BUN: 15 mg/dL (ref 8–23)
CO2: 22 mmol/L (ref 22–32)
Calcium: 9.2 mg/dL (ref 8.9–10.3)
Chloride: 103 mmol/L (ref 98–111)
Creatinine, Ser: 1.06 mg/dL — ABNORMAL HIGH (ref 0.44–1.00)
GFR, Estimated: 51 mL/min — ABNORMAL LOW (ref >60)
Glucose, Bld: 90 mg/dL (ref 70–99)
Potassium: 4.3 mmol/L (ref 3.5–5.1)
Sodium: 136 mmol/L (ref 135–145)

## 2022-11-03 LAB — PROTIME-INR
INR: 1.3 — ABNORMAL HIGH (ref 0.8–1.2)
Prothrombin Time: 16.6 seconds — ABNORMAL HIGH (ref 11.4–15.2)

## 2022-11-03 MED ORDER — SODIUM CHLORIDE 0.9 % IV SOLN
INTRAVENOUS | Status: AC
  Filled 2022-11-03: qty 2

## 2022-11-03 MED ORDER — LIDOCAINE-EPINEPHRINE 1 %-1:100000 IJ SOLN
20.0000 mL | Freq: Once | INTRAMUSCULAR | Status: AC
  Administered 2022-11-03: 10 mL via INTRADERMAL

## 2022-11-03 MED ORDER — FENTANYL CITRATE (PF) 100 MCG/2ML IJ SOLN
INTRAMUSCULAR | Status: AC
  Filled 2022-11-03: qty 2

## 2022-11-03 MED ORDER — MIDAZOLAM HCL 2 MG/2ML IJ SOLN
INTRAMUSCULAR | Status: AC
  Filled 2022-11-03: qty 2

## 2022-11-03 MED ORDER — SODIUM CHLORIDE 0.9 % IV SOLN: 2.0000 g | INTRAVENOUS | Status: DC

## 2022-11-03 MED ORDER — LIDOCAINE HCL 1 % IJ SOLN
20.0000 mL | Freq: Once | INTRAMUSCULAR | Status: AC
  Administered 2022-11-03: 20 mL

## 2022-11-03 MED ORDER — LIDOCAINE HCL 1 % IJ SOLN
INTRAMUSCULAR | Status: AC
  Filled 2022-11-03: qty 20

## 2022-11-03 MED ORDER — FENTANYL CITRATE (PF) 100 MCG/2ML IJ SOLN
INTRAMUSCULAR | Status: AC | PRN
Start: 2022-11-03 — End: 2022-11-03
  Administered 2022-11-03 (×4): 25 ug via INTRAVENOUS
  Administered 2022-11-03: 50 ug via INTRAVENOUS

## 2022-11-03 MED ORDER — LIDOCAINE-EPINEPHRINE 1 %-1:100000 IJ SOLN
INTRAMUSCULAR | Status: AC
  Filled 2022-11-03: qty 1

## 2022-11-03 MED ORDER — IOHEXOL 300 MG/ML  SOLN
150.0000 mL | Freq: Once | INTRAMUSCULAR | Status: AC | PRN
  Administered 2022-11-03: 30 mL

## 2022-11-03 MED ORDER — SODIUM CHLORIDE 0.9 % IV SOLN
INTRAVENOUS | Status: AC | PRN
  Administered 2022-11-03: 2 g via INTRAVENOUS

## 2022-11-03 MED ORDER — MIDAZOLAM HCL 2 MG/2ML IJ SOLN
INTRAMUSCULAR | Status: AC | PRN
Start: 2022-11-03 — End: 2022-11-03
  Administered 2022-11-03: 1 mg via INTRAVENOUS
  Administered 2022-11-03 (×4): .5 mg via INTRAVENOUS

## 2022-11-03 MED ORDER — SODIUM CHLORIDE 0.9% FLUSH: 5.0000 mL | Freq: Three times a day (TID) | INTRAVENOUS | Status: DC

## 2022-11-03 MED ORDER — SODIUM CHLORIDE 0.9 % IV SOLN: INTRAVENOUS | Status: DC

## 2022-11-03 NOTE — Procedures (Signed)
Interventional Radiology Procedure Note  Procedure:  1) Percutaneous gallstone retrieval 2) Cholecystostomy tube exchange  Findings: Please refer to procedural dictation for full description. ~1 cm gallstone retrieved.  At least 2 small stones remain.  Patent cyst and common bile ducts.  Drain exchanged/upsized to 14 Fr, to bag drainage.  Complications: None immediate  Estimated Blood Loss: < 5 mL  Recommendations: Keep to bag drainage. IR will arrange for outpatient cholecystostomy tube check in 2-3 weeks.   Marliss Coots, MD

## 2022-11-03 NOTE — H&P (Signed)
Chief Complaint: Patient was seen in consultation today for cholelithiasis with indwelling cholecystostomy tube.   Referring Physician(s): Dr. Emelia Loron  Supervising Physician: Marliss Coots  Patient Status: [name] - Out-pt  History of Present Illness: Rachel Park is an 89 y.o. female with a medical history significant for CAD, HTN, paroxysmal atrial fibrillation, SBO, stroke and acute calculous cholecystitis. She was seen in IR 5/14/2 for percutaneous cholecystostomy placement secondary to acute calculus cholecystitis. Follow up cholangiogram 09/01/22 showed cystic duct obstruction and at her follow up with the surgery team (Dr. Dwain Sarna) she was considered a poor surgical candidate.   The patient and her son Rachel Park met with Dr. Elby Showers 10/16/22 to discuss percutaneous gallstone retrieval. The patient denied any problems/issues with the drain but expressed a desire for the drain to be removed. Dr. Elby Showers discussed the risks, benefits and alternatives of the procedure. He also explained that the drain would be larger post-procedure and that the drain would need to stay in for at least one month depending on stone yield and patency of the cystic duct. The patient and her son were in agreement to proceed.   Past Medical History:  Diagnosis Date   Arthritis    Complication of anesthesia    Coronary artery disease    Hypertension    Hypothyroidism    PAF (paroxysmal atrial fibrillation) (HCC)    on Xarelto for maybe a year, opted to stop   PONV (postoperative nausea and vomiting)    Renal infarction (HCC)    Small bowel obstruction (HCC) 06/2015   Stroke (HCC)    oct 2016   TIA (transient ischemic attack) 03/24/2015   Vertigo, benign positional    to be evaluated, intermittent    Past Surgical History:  Procedure Laterality Date   ABDOMINAL HYSTERECTOMY     APPLICATION OF WOUND VAC N/A 07/23/2018   Procedure: APPLICATION OF WOUND VAC;  Surgeon: Emelia Loron,  MD;  Location: [facility] OR;  Service: General;  Laterality: N/A;   BOWEL RESECTION N/A 07/23/2018   Procedure: SMALL BOWEL RESECTION;  Surgeon: Emelia Loron, MD;  Location: [facility] OR;  Service: General;  Laterality: N/A;   CARDIAC CATHETERIZATION N/A 12/24/2014   Procedure: Left Heart Cath and Coronary Angiography;  Surgeon: Tonny Bollman, MD;  Location: [facility] INVASIVE CV LAB;  Service: Cardiovascular;  Laterality: N/A;   CARDIOVERSION N/A 05/22/2018   Procedure: CARDIOVERSION;  Surgeon: Wendall Stade, MD;  Location: [facility] ENDOSCOPY;  Service: Cardiovascular;  Laterality: N/A;   CATARACT EXTRACTION Bilateral    COLONOSCOPY WITH PROPOFOL N/A 12/01/2013   Procedure: COLONOSCOPY WITH PROPOFOL;  Surgeon: Charolett Bumpers, MD;  Location: [facility] ENDOSCOPY;  Service: Endoscopy;  Laterality: N/A;   COMPLEX WOUND CLOSURE  07/25/2018   Procedure: Complex Wound Closure;  Surgeon: Emelia Loron, MD;  Location: [facility] OR;  Service: General;;   Ferne Coe  07/25/2018   Procedure: Ileocecetomy;  Surgeon: Emelia Loron, MD;  Location: [facility] OR;  Service: General;;   INCISIONAL HERNIA REPAIR N/A 03/10/2016   Procedure: INCISIONAL HERNIA REPAIR TIMES TWO;  Surgeon: Darnell Level, MD;  Location: [facility] OR;  Service: General;  Laterality: N/A;   INGUINAL HERNIA REPAIR Bilateral 03/10/2016   Procedure: BILATERAL INGUINAL HERNIA REPAIRS;  Surgeon: Darnell Level, MD;  Location: [facility] OR;  Service: General;  Laterality: Bilateral;   INSERTION OF MESH N/A 03/10/2016   Procedure: INSERTION OF MESH TO BILATERAL GROINS AND ABDOMEN;  Surgeon: Darnell Level, MD;  Location: [facility] OR;  Service: General;  Laterality:  N/A;   IR CHOLANGIOGRAM EXISTING TUBE  09/01/2022   IR EXCHANGE BILIARY DRAIN  10/02/2022   IR PERC CHOLECYSTOSTOMY  07/18/2022   IR RADIOLOGIST EVAL & MGMT  10/16/2022   laparotomy  06/2015   LAPAROTOMY N/A 07/23/2018   Procedure: EXPLORATORY LAPAROTOMY;  Surgeon: Emelia Loron, MD;  Location: [facility] OR;  Service: General;  Laterality: N/A;    LEFT HEART CATH AND CORONARY ANGIOGRAPHY N/A 09/27/2016   Procedure: Left Heart Cath and Coronary Angiography;  Surgeon: Tonny Bollman, MD;  Location: [facility] INVASIVE CV LAB;  Service: Cardiovascular;  Laterality: N/A;   TONSILLECTOMY      Allergies: Propofol and Simvastatin  Medications: Prior to Admission medications   Medication Sig Start Date End Date Taking? Authorizing Provider  acetaminophen (TYLENOL) 500 MG tablet Take 1,000 mg by mouth every 6 (six) hours as needed for mild pain.     [provider]  alendronate (FOSAMAX) 70 MG tablet Take 70 mg by mouth once a week. Saturday 11/22/20   [provider]  apixaban (ELIQUIS) 2.5 MG TABS tablet Take 1 tablet (2.5 mg total) by mouth 2 (two) times daily. 08/25/19   Corrin Parker, PA-C  Calcium Carb-Cholecalciferol (CALCIUM + D3) 600-800 MG-UNIT TABS Take 1 tablet by mouth daily.    [provider]  Cholecalciferol (VITAMIN D3) 50 MCG (2000 UT) TABS Take 2,000 Units by mouth daily with lunch.    [provider]  diltiazem (TIAZAC) 300 MG 24 hr capsule Take 300 mg by mouth daily. 05/08/22   [provider]  famotidine (PEPCID) 10 MG tablet Take 10 mg by mouth daily as needed for heartburn.    [provider]  levothyroxine (SYNTHROID) 88 MCG tablet Take 88 mcg by mouth every morning. 10/30/19   [provider]  metoprolol succinate (TOPROL-XL) 100 MG 24 hr tablet Take 1 tablet (100 mg total) by mouth 2 (two) times daily. Take with or immediately following a meal. 12/27/20   Nahser, Deloris Ping, MD  MYRBETRIQ 50 MG TB24 tablet Take 50 mg by mouth daily. 05/05/22   [provider]  NITROSTAT 0.4 MG SL tablet Place 1 tablet (0.4 mg total) under the tongue every 5 (five) minutes as needed. Chest pain 06/24/18   Nahser, Deloris Ping, MD  ondansetron (ZOFRAN) 4 MG tablet Take 1 tablet (4 mg total) by mouth every 6 (six) hours as needed for nausea. 07/20/22   Marguerita Merles Latif, DO   oxyCODONE (OXY IR/ROXICODONE) 5 MG immediate release tablet Take 0.5-1 tablets (2.5-5 mg total) by mouth every 6 (six) hours as needed for severe pain or moderate pain. 07/20/22   Marguerita Merles Latif, DO  Polyethyl Glycol-Propyl Glycol 0.4-0.3 % SOLN Place 1-2 drops into both eyes daily as needed (for dry eye).    [provider]  rosuvastatin (CRESTOR) 10 MG tablet Take 1 tablet by mouth once daily Patient taking differently: Take 10 mg by mouth daily. 12/26/21   Nahser, Deloris Ping, MD  sodium chloride flush 0.9 % SOLN injection Instill 5 mL into the drain as needed. Discard extra 5ml from syringe once used 07/20/22   Marguerita Merles Latif, DO  spironolactone (ALDACTONE) 25 MG tablet Take 1 tablet by mouth once daily Patient taking differently: Take 25 mg by mouth daily. 09/13/21   Manson Passey, PA     Family History  Problem Relation Age of Onset   Heart attack Father 28       Died age 49   Stroke Mother  Cancer Sister        breast    Social History   Socioeconomic History   Marital status: Divorced    Spouse name: Not on file   Number of children: 2   Years of education: HS   Highest education level: Not on file  Occupational History   Occupation: retired  Tobacco Use   Smoking status: Never   Smokeless tobacco: Never  Vaping Use   Vaping status: Never Used  Substance and Sexual Activity   Alcohol use: Not Currently    Comment: occ.   Drug use: No   Sexual activity: Not on file  Other Topics Concern   Not on file  Social History Narrative   Lives alone.  Widow.  Two children   Social Determinants of Health   Financial Resource Strain: Not on file  Food Insecurity: No Food Insecurity (07/17/2022)   Hunger Vital Sign    Worried About Running Out of Food in the Last Year: Never true    Ran Out of Food in the Last Year: Never true  Transportation Needs: No Transportation Needs (07/17/2022)   PRAPARE - Administrator, Civil Service (Medical):  No    Lack of Transportation (Non-Medical): No  Physical Activity: Not on file  Stress: Not on file  Social Connections: Not on file    Review of Systems: A 12 point ROS discussed and pertinent positives are indicated in the HPI above.  All other systems are negative.  Review of Systems  Constitutional:  Negative for appetite change and fatigue.  Respiratory:  Negative for cough and shortness of breath.   Cardiovascular:  Negative for chest pain and leg swelling.  Gastrointestinal:  Negative for abdominal pain, nausea and vomiting.          Genitourinary:  Negative for flank pain.  Musculoskeletal:  Negative for back pain.  Neurological:  Negative for dizziness and headaches.    Vital Signs: BP (!) 128/56   Pulse 75   Temp 97.6 F (36.4 C) (Oral)   Resp 19   Ht 5\' 2"  (1.575 m)   Wt 140 lb (63.5 kg)   SpO2 98%   BMI 25.61 kg/m   Physical Exam Constitutional:      General: She is not in acute distress.    Appearance: She is not ill-appearing.  HENT:     Mouth/Throat:     Mouth: Mucous membranes are moist.     Pharynx: Oropharynx is clear.  Cardiovascular:     Rate and Rhythm: Normal rate and regular rhythm.     Pulses: Normal pulses.     Heart sounds: Normal heart sounds.  Pulmonary:     Effort: Pulmonary effort is normal.     Breath sounds: Normal breath sounds.  Abdominal:     General: Bowel sounds are normal.     Palpations: Abdomen is soft.     Tenderness: There is no abdominal tenderness.     Comments: RUQ cholecystostomy to gravity. Approximately 20 ml of orange/brown bile in bag. Skin insertion site is erythematous with with small to moderate amount of drainage. The dressing is moderately saturated with drainage. Suture and stat-lock in place. Patient denies pain or discomfort around the drain site  Skin:    General: Skin is warm and dry.  Neurological:     Mental Status: She is alert and oriented to person, place, and time.  Psychiatric:        Mood and  Affect: Mood  normal.        Behavior: Behavior normal.        Thought Content: Thought content normal.        Judgment: Judgment normal.    Imaging: IR Radiologist Eval & Mgmt  Result Date: 10/16/2022 EXAM: NEW PATIENT OFFICE VISIT CHIEF COMPLAINT: See Epic note. HISTORY OF PRESENT ILLNESS: See Epic note. REVIEW OF SYSTEMS: See Epic note. PHYSICAL EXAMINATION: See Epic note. ASSESSMENT AND PLAN: See Epic note. Marliss Coots, MD [facility] Radiology Electronically Signed   By: Marliss Coots M.D.   On: 10/16/2022 08:58    Labs:  CBC: Recent Labs    07/17/22 1900 07/18/22 0038 07/19/22 0849 07/20/22 0744  WBC 12.7* 8.9 9.2 8.3  HGB 13.6 12.3 10.9* 11.9*  HCT 42.6 36.8 33.3* 35.7*  PLT 231 210 188 193    COAGS: Recent Labs    07/18/22 1046  INR 1.2    BMP: Recent Labs    07/17/22 1900 07/18/22 0038 07/19/22 0849 07/20/22 0744  NA 132* 130* 133* 135  K 5.1 4.6 3.9 3.6  CL 100 100 104 103  CO2 15* 18* 20* 22  GLUCOSE 123* 109* 95 94  BUN 25* 22 11 9   CALCIUM 10.0 9.1 8.4* 8.5*  CREATININE 1.20* 1.08* 0.70 0.64  GFRNONAA 44* 49* >60 >60    LIVER FUNCTION TESTS: Recent Labs    07/17/22 1900 07/18/22 0038 07/19/22 0849 07/20/22 0744  BILITOT 0.9 1.3* 1.1 1.1  AST 132* 83* 43* 38  ALT 71* 59* 39 36  ALKPHOS 68 65 51 54  PROT 6.8 6.2* 5.6* 6.0*  ALBUMIN 4.3 3.7 3.2* 3.4*    TUMOR MARKERS: No results for input(s): "AFPTM", "CEA", "CA199", "CHROMGRNA" in the last 8760 hours.  Assessment and Plan:  Acute calculous cholecystitis s/p percutaneous cholecystostomy 07/18/22; poor surgical candidate: Rachel Park [place], 89 year old female, presents today to the [facility] Interventional Radiology department for an image-guided cholangioscopic (Spyglass) assisted percutaneous gallstone retrieval. The procedure was discussed with the patient and her son, Rachel Park.    Risks and benefits were discussed with the patient  including, but not limited to, bleeding, infection, gallbladder perforation, bile leak, sepsis or even death.  All of the patient's questions were answered, patient is agreeable to proceed. She has been NPO. She is a full code.   Consent signed and in chart.  Thank you for this interesting consult.  I greatly enjoyed meeting Rachel Park and look forward to participating in their care.  A copy of this report was sent to the requesting provider on this date.  Electronically Signed: Alwyn Ren, AGACNP-BC [phone] 11/03/2022, 11:01 AM   I spent a total of  30 Minutes   in face to face in clinical consultation, greater than 50% of which was counseling/coordinating care for cholelithiasis.

## 2022-11-08 ENCOUNTER — Other Ambulatory Visit: Payer: Self-pay | Admitting: General Surgery

## 2022-11-08 DIAGNOSIS — E785 Hyperlipidemia, unspecified: Secondary | ICD-10-CM | POA: Diagnosis not present

## 2022-11-08 DIAGNOSIS — I10 Essential (primary) hypertension: Secondary | ICD-10-CM | POA: Diagnosis not present

## 2022-11-08 DIAGNOSIS — E039 Hypothyroidism, unspecified: Secondary | ICD-10-CM | POA: Diagnosis not present

## 2022-11-08 DIAGNOSIS — K811 Chronic cholecystitis: Secondary | ICD-10-CM | POA: Diagnosis not present

## 2022-11-08 DIAGNOSIS — K8000 Calculus of gallbladder with acute cholecystitis without obstruction: Secondary | ICD-10-CM

## 2022-11-13 ENCOUNTER — Other Ambulatory Visit (HOSPITAL_COMMUNITY): Payer: Medicare HMO

## 2022-11-13 ENCOUNTER — Encounter (HOSPITAL_COMMUNITY): Payer: Self-pay

## 2022-11-13 DIAGNOSIS — Z1331 Encounter for screening for depression: Secondary | ICD-10-CM | POA: Diagnosis not present

## 2022-11-13 DIAGNOSIS — R82998 Other abnormal findings in urine: Secondary | ICD-10-CM | POA: Diagnosis not present

## 2022-11-13 DIAGNOSIS — K811 Chronic cholecystitis: Secondary | ICD-10-CM | POA: Diagnosis not present

## 2022-11-13 DIAGNOSIS — I4821 Permanent atrial fibrillation: Secondary | ICD-10-CM | POA: Diagnosis not present

## 2022-11-13 DIAGNOSIS — I700 Atherosclerosis of aorta: Secondary | ICD-10-CM | POA: Diagnosis not present

## 2022-11-13 DIAGNOSIS — I2510 Atherosclerotic heart disease of native coronary artery without angina pectoris: Secondary | ICD-10-CM | POA: Diagnosis not present

## 2022-11-13 DIAGNOSIS — F0390 Unspecified dementia without behavioral disturbance: Secondary | ICD-10-CM | POA: Diagnosis not present

## 2022-11-13 DIAGNOSIS — Z1339 Encounter for screening examination for other mental health and behavioral disorders: Secondary | ICD-10-CM | POA: Diagnosis not present

## 2022-11-13 DIAGNOSIS — G300 Alzheimer's disease with early onset: Secondary | ICD-10-CM | POA: Diagnosis not present

## 2022-11-13 DIAGNOSIS — Z7189 Other specified counseling: Secondary | ICD-10-CM | POA: Diagnosis not present

## 2022-11-13 DIAGNOSIS — Z23 Encounter for immunization: Secondary | ICD-10-CM | POA: Diagnosis not present

## 2022-11-13 DIAGNOSIS — Z0000 Encounter for general adult medical examination without abnormal findings: Secondary | ICD-10-CM | POA: Diagnosis not present

## 2022-11-13 DIAGNOSIS — I5022 Chronic systolic (congestive) heart failure: Secondary | ICD-10-CM | POA: Diagnosis not present

## 2022-11-13 DIAGNOSIS — E785 Hyperlipidemia, unspecified: Secondary | ICD-10-CM | POA: Diagnosis not present

## 2022-11-13 DIAGNOSIS — F02A0 Dementia in other diseases classified elsewhere, mild, without behavioral disturbance, psychotic disturbance, mood disturbance, and anxiety: Secondary | ICD-10-CM | POA: Diagnosis not present

## 2022-11-13 DIAGNOSIS — I10 Essential (primary) hypertension: Secondary | ICD-10-CM | POA: Diagnosis not present

## 2022-11-13 DIAGNOSIS — D6869 Other thrombophilia: Secondary | ICD-10-CM | POA: Diagnosis not present

## 2022-11-13 DIAGNOSIS — I272 Pulmonary hypertension, unspecified: Secondary | ICD-10-CM | POA: Diagnosis not present

## 2022-11-14 ENCOUNTER — Other Ambulatory Visit (HOSPITAL_COMMUNITY): Payer: Self-pay | Admitting: Internal Medicine

## 2022-11-14 DIAGNOSIS — F0280 Dementia in other diseases classified elsewhere without behavioral disturbance: Secondary | ICD-10-CM

## 2022-11-21 NOTE — Progress Notes (Shared)
Referring Physician(s): Emelia Loron  Chief Complaint: The patient is seen in follow up today s/p cholangioscopic (Spyglass) assisted gallstone retrieval   History of present illness: HPI from initial consult 10/16/22 Rachel Park is an 89 y.o. female with history of acute calculous cholecystitis who due to multiple comorbidities including age, atrial fibrillation, history of TIA, was deemed a poor surgical candidate and underwent percutaneous cholecystostomy tube placement by Dr. Loreta Ave on 07/18/22.  She has followed up with Dr. Dwain Sarna to consider surgical options and is still a poor surgical candidate therefore presents today, alongside her son, Dr. Tawanna Cooler Early,  to discuss percutaneous gallstone retrieval.  She has had no problems with the drain, but is hopeful to have it removed.  No recent fevers, chills, abdominal pain, change in bowel habits, or change in bilious output from the drain.  She has never had appreciable hemobilia.    On 11/03/22 she underwent percutaneous gallstone retrieval with cholecystostomy tube exchange/upsize. A few small stones remained as they were unreachable after initial stone retrieval. She tolerated the procedure well and was discharged home the same days. She presents to the clinic today for a drain injection to assess for cystic/common bile duct patency and residual stone burden. She is doing well without any abdominal pain, nausea, vomiting, jaundice.  Her drain has been putting out about 150 mL per day.    Past Medical History:  Diagnosis Date   Arthritis    Complication of anesthesia    Coronary artery disease    Hypertension    Hypothyroidism    PAF (paroxysmal atrial fibrillation) (HCC)    on Xarelto for maybe a year, opted to stop   PONV (postoperative nausea and vomiting)    Renal infarction (HCC)    Small bowel obstruction (HCC) 06/2015   Stroke (HCC)    oct 2016   TIA (transient ischemic attack) 03/24/2015   Vertigo, benign positional     to be evaluated, intermittent    Past Surgical History:  Procedure Laterality Date   ABDOMINAL HYSTERECTOMY     APPLICATION OF WOUND VAC N/A 07/23/2018   Procedure: APPLICATION OF WOUND VAC;  Surgeon: Emelia Loron, MD;  Location: [facility] OR;  Service: General;  Laterality: N/A;   BOWEL RESECTION N/A 07/23/2018   Procedure: SMALL BOWEL RESECTION;  Surgeon: Emelia Loron, MD;  Location: [facility] OR;  Service: General;  Laterality: N/A;   CARDIAC CATHETERIZATION N/A 12/24/2014   Procedure: Left Heart Cath and Coronary Angiography;  Surgeon: Tonny Bollman, MD;  Location: [facility] INVASIVE CV LAB;  Service: Cardiovascular;  Laterality: N/A;   CARDIOVERSION N/A 05/22/2018   Procedure: CARDIOVERSION;  Surgeon: Wendall Stade, MD;  Location: [facility] ENDOSCOPY;  Service: Cardiovascular;  Laterality: N/A;   CATARACT EXTRACTION Bilateral    COLONOSCOPY WITH PROPOFOL N/A 12/01/2013   Procedure: COLONOSCOPY WITH PROPOFOL;  Surgeon: Charolett Bumpers, MD;  Location: [facility] ENDOSCOPY;  Service: Endoscopy;  Laterality: N/A;   COMPLEX WOUND CLOSURE  07/25/2018   Procedure: Complex Wound Closure;  Surgeon: Emelia Loron, MD;  Location: [facility] OR;  Service: General;;   Ferne Coe  07/25/2018   Procedure: Ileocecetomy;  Surgeon: Emelia Loron, MD;  Location: [facility] OR;  Service: General;;   INCISIONAL HERNIA REPAIR N/A 03/10/2016   Procedure: INCISIONAL HERNIA REPAIR TIMES TWO;  Surgeon: Darnell Level, MD;  Location: [facility] OR;  Service: General;  Laterality: N/A;   INGUINAL HERNIA REPAIR Bilateral 03/10/2016   Procedure: BILATERAL INGUINAL HERNIA REPAIRS;  Surgeon: Darnell Level, MD;  Location: [facility]  OR;  Service: General;  Laterality: Bilateral;   INSERTION OF MESH N/A 03/10/2016   Procedure: INSERTION OF MESH TO BILATERAL GROINS AND ABDOMEN;  Surgeon: Darnell Level, MD;  Location: [facility] OR;  Service: General;  Laterality: N/A;   IR CHOLANGIOGRAM EXISTING TUBE  09/01/2022   IR EXCHANGE BILIARY DRAIN  10/02/2022   IR EXCHANGE BILIARY DRAIN   11/03/2022   IR PERC CHOLECYSTOSTOMY  07/18/2022   IR RADIOLOGIST EVAL & MGMT  10/16/2022   IR REMOVAL OF CALCULI/DEBRIS BILIARY DUCT/GB  11/03/2022   laparotomy  06/2015   LAPAROTOMY N/A 07/23/2018   Procedure: EXPLORATORY LAPAROTOMY;  Surgeon: Emelia Loron, MD;  Location: [facility] OR;  Service: General;  Laterality: N/A;   LEFT HEART CATH AND CORONARY ANGIOGRAPHY N/A 09/27/2016   Procedure: Left Heart Cath and Coronary Angiography;  Surgeon: Tonny Bollman, MD;  Location: [facility] INVASIVE CV LAB;  Service: Cardiovascular;  Laterality: N/A;   TONSILLECTOMY      Allergies: Propofol and Simvastatin  Medications: Prior to Admission medications   Medication Sig Start Date End Date Taking? Authorizing Provider  acetaminophen (TYLENOL) 500 MG tablet Take 1,000 mg by mouth every 6 (six) hours as needed for mild pain.     [provider]  alendronate (FOSAMAX) 70 MG tablet Take 70 mg by mouth once a week. Saturday 11/22/20   [provider]  apixaban (ELIQUIS) 2.5 MG TABS tablet Take 1 tablet (2.5 mg total) by mouth 2 (two) times daily. 08/25/19   Corrin Parker, PA-C  Calcium Carb-Cholecalciferol (CALCIUM + D3) 600-800 MG-UNIT TABS Take 1 tablet by mouth daily.    [provider]  Cholecalciferol (VITAMIN D3) 50 MCG (2000 UT) TABS Take 2,000 Units by mouth daily with lunch.    [provider]  diltiazem (TIAZAC) 300 MG 24 hr capsule Take 300 mg by mouth daily. 05/08/22   [provider]  famotidine (PEPCID) 10 MG tablet Take 10 mg by mouth daily as needed for heartburn.    [provider]  levothyroxine (SYNTHROID) 88 MCG tablet Take 88 mcg by mouth every morning. 10/30/19   [provider]  metoprolol succinate (TOPROL-XL) 100 MG 24 hr tablet Take 1 tablet (100 mg total) by mouth 2 (two) times daily. Take with or immediately following a meal. 12/27/20   Nahser, Deloris Ping, MD  MYRBETRIQ 50 MG TB24 tablet Take 50 mg by mouth daily. 05/05/22    [provider]  NITROSTAT 0.4 MG SL tablet Place 1 tablet (0.4 mg total) under the tongue every 5 (five) minutes as needed. Chest pain 06/24/18   Nahser, Deloris Ping, MD  ondansetron (ZOFRAN) 4 MG tablet Take 1 tablet (4 mg total) by mouth every 6 (six) hours as needed for nausea. 07/20/22   Marguerita Merles Latif, DO  oxyCODONE (OXY IR/ROXICODONE) 5 MG immediate release tablet Take 0.5-1 tablets (2.5-5 mg total) by mouth every 6 (six) hours as needed for severe pain or moderate pain. 07/20/22   Marguerita Merles Latif, DO  Polyethyl Glycol-Propyl Glycol 0.4-0.3 % SOLN Place 1-2 drops into both eyes daily as needed (for dry eye).    [provider]  rosuvastatin (CRESTOR) 10 MG tablet Take 1 tablet by mouth once daily Patient taking differently: Take 10 mg by mouth daily. 12/26/21   Nahser, Deloris Ping, MD  sodium chloride flush 0.9 % SOLN injection Instill 5 mL into the drain as needed. Discard extra 5ml from syringe once used 07/20/22   Marguerita Merles Latif, DO  spironolactone (  ALDACTONE) 25 MG tablet Take 1 tablet by mouth once daily Patient taking differently: Take 25 mg by mouth daily. 09/13/21   Manson Passey, PA     Family History  Problem Relation Age of Onset   Heart attack Father 74       Died age 102   Stroke Mother    Cancer Sister        breast    Social History   Socioeconomic History   Marital status: Divorced    Spouse name: Not on file   Number of children: 2   Years of education: HS   Highest education level: Not on file  Occupational History   Occupation: retired  Tobacco Use   Smoking status: Never   Smokeless tobacco: Never  Vaping Use   Vaping status: Never Used  Substance and Sexual Activity   Alcohol use: Not Currently    Comment: occ.   Drug use: No   Sexual activity: Not on file  Other Topics Concern   Not on file  Social History Narrative   Lives alone.  Widow.  Two children   Social Determinants of Health   Financial Resource Strain:  Not on file  Food Insecurity: No Food Insecurity (07/17/2022)   Hunger Vital Sign    Worried About Running Out of Food in the Last Year: Never true    Ran Out of Food in the Last Year: Never true  Transportation Needs: No Transportation Needs (07/17/2022)   PRAPARE - Administrator, Civil Service (Medical): No    Lack of Transportation (Non-Medical): No  Physical Activity: Not on file  Stress: Not on file  Social Connections: Not on file     Vital Signs: There were no vitals taken for this visit.  Physical Exam Constitutional:      General: She is not in acute distress. HENT:     Head: Normocephalic.     Mouth/Throat:     Mouth: Mucous membranes are moist.  Eyes:     General: No scleral icterus. Pulmonary:     Effort: Pulmonary effort is normal.  Abdominal:     General: There is no distension.     Comments: RUQ cholecystostomy tube in place, clear bilious drainage in bag.  Skin:    General: Skin is warm and dry.     Coloration: Skin is not jaundiced.  Neurological:     Mental Status: She is alert and oriented to person, place, and time.     Imaging: CT AP 07/17/22    10/02/22  10 Fr  Percutaneous gallstone retrieval 11/03/22  IMPRESSION: 1. Similar appearing cholelithiasis with patent cystic and common bile ducts. 2. Technically successful cholangioscopic-guided percutaneous gallstone retrieval. A few small gallstones remain which were un-reachable after initial stone retrieval today. 3. Technically successful replacement upsize of percutaneous cholecystostomy tube, now 57 [place]. The tube is to bag drainage. PLAN: Return in 2-3 weeks for drain injection to assess for cystic and common bile duct patency and residual stone burden. If the cystic duct remains patent at this time, capping trial will be initiated.    Labs:  CBC: Recent Labs    07/18/22 0038 07/19/22 0849 07/20/22 0744 11/03/22 1042  WBC 8.9 9.2 8.3 7.2  HGB 12.3 10.9* 11.9*  11.9*  HCT 36.8 33.3* 35.7* 36.5  PLT 210 188 193 241    COAGS: Recent Labs    07/18/22 1046 11/03/22 1042  INR 1.2 1.3*    BMP: Recent Labs  07/18/22 0038 07/19/22 0849 07/20/22 0744 11/03/22 1042  NA 130* 133* 135 136  K 4.6 3.9 3.6 4.3  CL 100 104 103 103  CO2 18* 20* 22 22  GLUCOSE 109* 95 94 90  BUN 22 11 9 15   CALCIUM 9.1 8.4* 8.5* 9.2  CREATININE 1.08* 0.70 0.64 1.06*  GFRNONAA 49* >60 >60 51*    LIVER FUNCTION TESTS: Recent Labs    07/17/22 1900 07/18/22 0038 07/19/22 0849 07/20/22 0744  BILITOT 0.9 1.3* 1.1 1.1  AST 132* 83* 43* 38  ALT 71* 59* 39 36  ALKPHOS 68 65 51 54  PROT 6.8 6.2* 5.6* 6.0*  ALBUMIN 4.3 3.7 3.2* 3.4*    Assessment and Plan: 89 year old female with history of acute calculous cholecystitis managed by percutaneous cholecystostomy tube placement on 07/18/22. She remains a poor surgical candidate. She underwent cholangioscopic-assisted percutaneous gallstone retrieval 11/03/22 with a few small residual stones that were unable to be captured percutaneously.  She has done well since the procedure.  Today the cystic and common bile ducts are patent on injection.  The drain was capped.  Plan for follow up in 2 weeks for repeat drain injection and possible removal if capping trial tolerated.    Marliss Coots, MD Pager: [phone]    I spent a total of 40 Minutes in face to face in clinical consultation, greater than 50% of which was counseling/coordinating care for calculous cholecystitis.

## 2022-11-22 ENCOUNTER — Ambulatory Visit
Admission: RE | Admit: 2022-11-22 | Discharge: 2022-11-22 | Disposition: A | Discharge status: Home / Self Care | Payer: Medicare HMO | Source: Ambulatory Visit | Attending: Student

## 2022-11-22 ENCOUNTER — Other Ambulatory Visit: Payer: Self-pay | Admitting: Interventional Radiology

## 2022-11-22 ENCOUNTER — Ambulatory Visit
Admission: RE | Admit: 2022-11-22 | Discharge: 2022-11-22 | Disposition: A | Discharge status: Home / Self Care | Payer: Medicare HMO | Source: Ambulatory Visit | Attending: General Surgery | Admitting: General Surgery

## 2022-11-22 DIAGNOSIS — K8000 Calculus of gallbladder with acute cholecystitis without obstruction: Secondary | ICD-10-CM

## 2022-11-27 ENCOUNTER — Ambulatory Visit (HOSPITAL_COMMUNITY): Payer: Medicare HMO

## 2022-11-28 ENCOUNTER — Encounter (HOSPITAL_COMMUNITY): Payer: Self-pay

## 2022-11-28 ENCOUNTER — Ambulatory Visit (HOSPITAL_COMMUNITY): Payer: Medicare HMO

## 2022-11-29 ENCOUNTER — Ambulatory Visit (HOSPITAL_COMMUNITY)
Admission: RE | Admit: 2022-11-29 | Discharge: 2022-11-29 | Disposition: A | Discharge status: Home / Self Care | Payer: Medicare HMO | Source: Ambulatory Visit | Attending: Internal Medicine | Admitting: Internal Medicine

## 2022-11-29 DIAGNOSIS — F0280 Dementia in other diseases classified elsewhere without behavioral disturbance: Secondary | ICD-10-CM | POA: Diagnosis not present

## 2022-11-29 DIAGNOSIS — G300 Alzheimer's disease with early onset: Secondary | ICD-10-CM | POA: Insufficient documentation

## 2022-11-29 DIAGNOSIS — I639 Cerebral infarction, unspecified: Secondary | ICD-10-CM | POA: Diagnosis not present

## 2022-11-29 DIAGNOSIS — I6782 Cerebral ischemia: Secondary | ICD-10-CM | POA: Diagnosis not present

## 2022-11-29 DIAGNOSIS — R9089 Other abnormal findings on diagnostic imaging of central nervous system: Secondary | ICD-10-CM | POA: Diagnosis not present

## 2022-11-29 MED ORDER — GADOBUTROL 1 MMOL/ML IV SOLN
6.0000 mL | Freq: Once | INTRAVENOUS | Status: AC | PRN
  Administered 2022-11-29: 6 mL via INTRAVENOUS

## 2022-11-30 ENCOUNTER — Other Ambulatory Visit (HOSPITAL_COMMUNITY): Payer: Self-pay

## 2022-12-06 ENCOUNTER — Other Ambulatory Visit: Payer: Medicare HMO

## 2022-12-06 ENCOUNTER — Ambulatory Visit
Admission: RE | Admit: 2022-12-06 | Discharge: 2022-12-06 | Disposition: A | Discharge status: Home / Self Care | Payer: Medicare HMO | Source: Ambulatory Visit | Attending: Interventional Radiology | Admitting: Interventional Radiology

## 2022-12-06 DIAGNOSIS — K8000 Calculus of gallbladder with acute cholecystitis without obstruction: Secondary | ICD-10-CM

## 2022-12-06 HISTORY — PX: IR RADIOLOGIST EVAL & MGMT: IMG5224

## 2022-12-06 NOTE — Progress Notes (Signed)
Referring Physician(s): Emelia Loron   Chief Complaint: The patient is seen in follow up today s/p cholangioscopic (Spyglass) assisted gallstone retrieval    History of present illness: HPI from initial consult 10/16/22 Rachel Park is an 89 y.o. female with history of acute calculous cholecystitis who due to multiple comorbidities including age, atrial fibrillation, history of TIA, was deemed a poor surgical candidate and underwent percutaneous cholecystostomy tube placement by Dr. Loreta Ave on 07/18/22.  She has followed up with Dr. Dwain Sarna to consider surgical options and is still a poor surgical candidate therefore presents today, alongside her son, Dr. Tawanna Cooler Early,  to discuss percutaneous gallstone retrieval.  She has had no problems with the drain, but is hopeful to have it removed.  No recent fevers, chills, abdominal pain, change in bowel habits, or change in bilious output from the drain.  She has never had appreciable hemobilia.     On 11/03/22 she underwent percutaneous gallstone retrieval with cholecystostomy tube exchange/upsize. A few small stones remained as they were unreachable after initial stone retrieval. She tolerated the procedure well and was discharged home the same days. She presents to the clinic today for a drain injection to assess for cystic/common bile duct patency after 2 week capping trial. She is doing well without any abdominal pain, nausea, vomiting, jaundice.  She reports some persistent mild discomfort at the drain entry site.   Past Medical History:  Diagnosis Date   Arthritis    Complication of anesthesia    Coronary artery disease    Hypertension    Hypothyroidism    PAF (paroxysmal atrial fibrillation) (HCC)    on Xarelto for maybe a year, opted to stop   PONV (postoperative nausea and vomiting)    Renal infarction (HCC)    Small bowel obstruction (HCC) 06/2015   Stroke (HCC)    oct 2016   TIA (transient ischemic attack) 03/24/2015   Vertigo,  benign positional    to be evaluated, intermittent    Past Surgical History:  Procedure Laterality Date   ABDOMINAL HYSTERECTOMY     APPLICATION OF WOUND VAC N/A 07/23/2018   Procedure: APPLICATION OF WOUND VAC;  Surgeon: Emelia Loron, MD;  Location: [facility] OR;  Service: General;  Laterality: N/A;   BOWEL RESECTION N/A 07/23/2018   Procedure: SMALL BOWEL RESECTION;  Surgeon: Emelia Loron, MD;  Location: [facility] OR;  Service: General;  Laterality: N/A;   CARDIAC CATHETERIZATION N/A 12/24/2014   Procedure: Left Heart Cath and Coronary Angiography;  Surgeon: Tonny Bollman, MD;  Location: [facility] INVASIVE CV LAB;  Service: Cardiovascular;  Laterality: N/A;   CARDIOVERSION N/A 05/22/2018   Procedure: CARDIOVERSION;  Surgeon: Wendall Stade, MD;  Location: [facility] ENDOSCOPY;  Service: Cardiovascular;  Laterality: N/A;   CATARACT EXTRACTION Bilateral    COLONOSCOPY WITH PROPOFOL N/A 12/01/2013   Procedure: COLONOSCOPY WITH PROPOFOL;  Surgeon: Charolett Bumpers, MD;  Location: [facility] ENDOSCOPY;  Service: Endoscopy;  Laterality: N/A;   COMPLEX WOUND CLOSURE  07/25/2018   Procedure: Complex Wound Closure;  Surgeon: Emelia Loron, MD;  Location: [facility] OR;  Service: General;;   Ferne Coe  07/25/2018   Procedure: Ileocecetomy;  Surgeon: Emelia Loron, MD;  Location: [facility] OR;  Service: General;;   INCISIONAL HERNIA REPAIR N/A 03/10/2016   Procedure: INCISIONAL HERNIA REPAIR TIMES TWO;  Surgeon: Darnell Level, MD;  Location: [facility] OR;  Service: General;  Laterality: N/A;   INGUINAL HERNIA REPAIR Bilateral 03/10/2016   Procedure: BILATERAL INGUINAL HERNIA REPAIRS;  Surgeon: Darnell Level, MD;  Location: [facility] OR;  Service: General;  Laterality: Bilateral;   INSERTION OF MESH N/A 03/10/2016   Procedure: INSERTION OF MESH TO BILATERAL GROINS AND ABDOMEN;  Surgeon: Darnell Level, MD;  Location: [facility] OR;  Service: General;  Laterality: N/A;   IR CHOLANGIOGRAM EXISTING TUBE  09/01/2022   IR EXCHANGE BILIARY DRAIN  10/02/2022   IR  EXCHANGE BILIARY DRAIN  11/03/2022   IR PERC CHOLECYSTOSTOMY  07/18/2022   IR RADIOLOGIST EVAL & MGMT  10/16/2022   IR REMOVAL OF CALCULI/DEBRIS BILIARY DUCT/GB  11/03/2022   laparotomy  06/2015   LAPAROTOMY N/A 07/23/2018   Procedure: EXPLORATORY LAPAROTOMY;  Surgeon: Emelia Loron, MD;  Location: [facility] OR;  Service: General;  Laterality: N/A;   LEFT HEART CATH AND CORONARY ANGIOGRAPHY N/A 09/27/2016   Procedure: Left Heart Cath and Coronary Angiography;  Surgeon: Tonny Bollman, MD;  Location: [facility] INVASIVE CV LAB;  Service: Cardiovascular;  Laterality: N/A;   TONSILLECTOMY      Allergies: Propofol and Simvastatin  Medications: Prior to Admission medications   Medication Sig Start Date End Date Taking? Authorizing Provider  acetaminophen (TYLENOL) 500 MG tablet Take 1,000 mg by mouth every 6 (six) hours as needed for mild pain.     [provider]  alendronate (FOSAMAX) 70 MG tablet Take 70 mg by mouth once a week. Saturday 11/22/20   [provider]  apixaban (ELIQUIS) 2.5 MG TABS tablet Take 1 tablet (2.5 mg total) by mouth 2 (two) times daily. 08/25/19   Corrin Parker, PA-C  Calcium Carb-Cholecalciferol (CALCIUM + D3) 600-800 MG-UNIT TABS Take 1 tablet by mouth daily.    [provider]  Cholecalciferol (VITAMIN D3) 50 MCG (2000 UT) TABS Take 2,000 Units by mouth daily with lunch.    [provider]  diltiazem (TIAZAC) 300 MG 24 hr capsule Take 300 mg by mouth daily. 05/08/22   [provider]  famotidine (PEPCID) 10 MG tablet Take 10 mg by mouth daily as needed for heartburn.    [provider]  levothyroxine (SYNTHROID) 88 MCG tablet Take 88 mcg by mouth every morning. 10/30/19   [provider]  metoprolol succinate (TOPROL-XL) 100 MG 24 hr tablet Take 1 tablet (100 mg total) by mouth 2 (two) times daily. Take with or immediately following a meal. 12/27/20   Nahser, Deloris Ping, MD  MYRBETRIQ 50 MG TB24 tablet Take 50 mg by  mouth daily. 05/05/22   [provider]  NITROSTAT 0.4 MG SL tablet Place 1 tablet (0.4 mg total) under the tongue every 5 (five) minutes as needed. Chest pain 06/24/18   Nahser, Deloris Ping, MD  ondansetron (ZOFRAN) 4 MG tablet Take 1 tablet (4 mg total) by mouth every 6 (six) hours as needed for nausea. 07/20/22   Marguerita Merles Latif, DO  oxyCODONE (OXY IR/ROXICODONE) 5 MG immediate release tablet Take 0.5-1 tablets (2.5-5 mg total) by mouth every 6 (six) hours as needed for severe pain or moderate pain. 07/20/22   Marguerita Merles Latif, DO  Polyethyl Glycol-Propyl Glycol 0.4-0.3 % SOLN Place 1-2 drops into both eyes daily as needed (for dry eye).    [provider]  rosuvastatin (CRESTOR) 10 MG tablet Take 1 tablet by mouth once daily Patient taking differently: Take 10 mg by mouth daily. 12/26/21   Nahser, Deloris Ping, MD  sodium chloride flush 0.9 % SOLN injection Instill 5 mL into the drain as needed. Discard extra 5ml from syringe once used 07/20/22   Marguerita Merles [place], DO  spironolactone (ALDACTONE) 25 MG tablet Take 1 tablet by mouth once daily Patient taking differently: Take 25 mg by mouth daily. 09/13/21   Manson Passey, PA     Family History  Problem Relation Age of Onset   Heart attack Father 69       Died age 21   Stroke Mother    Cancer Sister        breast    Social History   Socioeconomic History   Marital status: Divorced    Spouse name: Not on file   Number of children: 2   Years of education: HS   Highest education level: Not on file  Occupational History   Occupation: retired  Tobacco Use   Smoking status: Never   Smokeless tobacco: Never  Vaping Use   Vaping status: Never Used  Substance and Sexual Activity   Alcohol use: Not Currently    Comment: occ.   Drug use: No   Sexual activity: Not on file  Other Topics Concern   Not on file  Social History Narrative   Lives alone.  Widow.  Two children   Social Determinants of Health    Financial Resource Strain: Not on file  Food Insecurity: No Food Insecurity (07/17/2022)   Hunger Vital Sign    Worried About Running Out of Food in the Last Year: Never true    Ran Out of Food in the Last Year: Never true  Transportation Needs: No Transportation Needs (07/17/2022)   PRAPARE - Administrator, Civil Service (Medical): No    Lack of Transportation (Non-Medical): No  Physical Activity: Not on file  Stress: Not on file  Social Connections: Not on file     Vital Signs: BP 130/64   Pulse 80   Temp 97.6 F (36.4 C)   Resp 20   SpO2 96%   Physical Exam Constitutional:      General: She is not in acute distress. HENT:     Head: Normocephalic.     Mouth/Throat:     Mouth: Mucous membranes are moist.  Eyes:     General: No scleral icterus. Cardiovascular:     Rate and Rhythm: Normal rate.  Pulmonary:     Effort: Pulmonary effort is normal. No respiratory distress.  Abdominal:     General: There is no distension.     Palpations: Abdomen is soft.     Comments: RUQ cholecystostomy drain in place, clean dry and intact.  Capped.  Skin:    General: Skin is warm and dry.     Coloration: Skin is not jaundiced.  Neurological:     Mental Status: She is alert and oriented to person, place, and time.     Imaging: CT AP 07/17/22    10/02/22  10 Fr   Percutaneous gallstone retrieval 11/03/22  IMPRESSION: 1. Similar appearing cholelithiasis with patent cystic and common bile ducts. 2. Technically successful cholangioscopic-guided percutaneous gallstone retrieval. A few small gallstones remain which were un-reachable after initial stone retrieval today. 3. Technically successful replacement upsize of percutaneous cholecystostomy tube, now 72 [place]. The tube is to bag drainage. PLAN: Return in 2-3 weeks for drain injection to assess for cystic and common bile duct patency and residual stone burden. If the cystic duct remains patent at this time,  capping trial will be initiated.   Drain check 12/06/22   Labs:  CBC: Recent Labs    07/18/22 0038 07/19/22 0849 07/20/22 0744 11/03/22 1042  WBC 8.9 9.2 8.3  7.2  HGB 12.3 10.9* 11.9* 11.9*  HCT 36.8 33.3* 35.7* 36.5  PLT 210 188 193 241    COAGS: Recent Labs    07/18/22 1046 11/03/22 1042  INR 1.2 1.3*    BMP: Recent Labs    07/18/22 0038 07/19/22 0849 07/20/22 0744 11/03/22 1042  NA 130* 133* 135 136  K 4.6 3.9 3.6 4.3  CL 100 104 103 103  CO2 18* 20* 22 22  GLUCOSE 109* 95 94 90  BUN 22 11 9 15   CALCIUM 9.1 8.4* 8.5* 9.2  CREATININE 1.08* 0.70 0.64 1.06*  GFRNONAA 49* >60 >60 51*    LIVER FUNCTION TESTS: Recent Labs    07/17/22 1900 07/18/22 0038 07/19/22 0849 07/20/22 0744  BILITOT 0.9 1.3* 1.1 1.1  AST 132* 83* 43* 38  ALT 71* 59* 39 36  ALKPHOS 68 65 51 54  PROT 6.8 6.2* 5.6* 6.0*  ALBUMIN 4.3 3.7 3.2* 3.4*    Assessment and Plan: 89 year old female with history of acute calculous cholecystitis managed by percutaneous cholecystostomy tube placement on 07/18/22. She remains a poor surgical candidate. She underwent cholangioscopic-assisted percutaneous gallstone retrieval 11/03/22 with a few small residual stones that were unable to be captured percutaneously.  She has done well since the procedure and now tolerated a 2 week capping trial with the cystic and common bile ducts remaining patent on injection.  The drain was removed.  Follow up with IR as needed.  Electronically Signed: Bennie Dallas 12/06/2022, 11:34 AM   I spent a total of 25 Minutes in face to face in clinical consultation, greater than 50% of which was counseling/coordinating care for cholecystostomy care.

## 2022-12-14 ENCOUNTER — Ambulatory Visit: Payer: Medicare HMO | Admitting: Orthopedic Surgery

## 2022-12-14 ENCOUNTER — Other Ambulatory Visit (INDEPENDENT_AMBULATORY_CARE_PROVIDER_SITE_OTHER): Payer: Medicare HMO

## 2022-12-14 DIAGNOSIS — M25511 Pain in right shoulder: Secondary | ICD-10-CM | POA: Diagnosis not present

## 2022-12-14 DIAGNOSIS — G8929 Other chronic pain: Secondary | ICD-10-CM | POA: Diagnosis not present

## 2022-12-20 ENCOUNTER — Encounter: Payer: Self-pay | Admitting: Orthopedic Surgery

## 2022-12-20 DIAGNOSIS — M25511 Pain in right shoulder: Secondary | ICD-10-CM

## 2022-12-20 DIAGNOSIS — G8929 Other chronic pain: Secondary | ICD-10-CM | POA: Diagnosis not present

## 2022-12-20 MED ORDER — LIDOCAINE HCL 1 % IJ SOLN
5.0000 mL | INTRAMUSCULAR | Status: AC | PRN
Start: 2022-12-20 — End: 2022-12-20
  Administered 2022-12-20: 5 mL

## 2022-12-20 MED ORDER — METHYLPREDNISOLONE ACETATE 40 MG/ML IJ SUSP
40.0000 mg | INTRAMUSCULAR | Status: AC | PRN
Start: 2022-12-20 — End: 2022-12-20
  Administered 2022-12-20: 40 mg via INTRA_ARTICULAR

## 2022-12-20 NOTE — Progress Notes (Signed)
Office Visit Note   Patient: Rachel Park           Date of Birth: June 04, 1931           MRN: [id number] Visit Date: 12/14/2022              Requested by: Cleatis Polka., MD [address] [place],  [place] [address] PCP: Cleatis Polka., MD  Chief Complaint  Patient presents with   Right Shoulder - Pain      HPI: Patient is a 89 year old woman who is seen for right shoulder pain.  Patient states she has been symptomatic for the past 3 to 4 months.  She has used Tylenol without relief.  She has difficulty with elevation and difficulty reaching behind her self.  Assessment & Plan: Visit Diagnoses:  1. Chronic right shoulder pain     Plan: Right subacromial space was injected she will follow-up as needed.  Follow-Up Instructions: Return if symptoms worsen or fail to improve.   Ortho Exam  Patient is alert, oriented, no adenopathy, well-dressed, normal affect, normal respiratory effort. Examination patient has abduction and flexion to 90 degrees she has pain with Neer and Hawkins impingement test pain to palpation over the biceps tendon.  AC joint is not tender to palpation.  Imaging: No results found. No images are attached to the encounter.  Labs: Lab Results  Component Value Date   HGBA1C 5.8 (H) 07/23/2018   HGBA1C 5.4 12/25/2014   CRP 1.3 (H) 07/19/2022   REPTSTATUS 07/23/2022 FINAL 07/18/2022   GRAMSTAIN  07/18/2022    RARE WBC PRESENT, PREDOMINANTLY PMN NO ORGANISMS SEEN    CULT  07/18/2022    No growth aerobically or anaerobically. Performed at [facility] Lab, [address], [place], [place] [address]      Lab Results  Component Value Date   ALBUMIN 3.4 (L) 07/20/2022   ALBUMIN 3.2 (L) 07/19/2022   ALBUMIN 3.7 07/18/2022   PREALBUMIN 12.4 (L) 08/05/2018   PREALBUMIN 6.8 (L) 07/29/2018   PREALBUMIN 6.2 (L) 07/27/2018    Lab Results  Component Value Date   MG 2.0 07/20/2022   MG 1.5 (L) 07/19/2022   MG 1.7 08/24/2019   No  results found for: "VD25OH"  Lab Results  Component Value Date   PREALBUMIN 12.4 (L) 08/05/2018   PREALBUMIN 6.8 (L) 07/29/2018   PREALBUMIN 6.2 (L) 07/27/2018      Latest Ref Rng & Units 11/03/2022   10:42 AM 07/20/2022    7:44 AM 07/19/2022    8:49 AM  CBC EXTENDED  WBC 4.0 - 10.5 K/uL 7.2  8.3  9.2   RBC 3.87 - 5.11 MIL/uL 3.92  3.80  3.55   Hemoglobin 12.0 - 15.0 g/dL 88.4  16.6  06.3   HCT 36.0 - 46.0 % 36.5  35.7  33.3   Platelets 150 - 400 K/uL 241  193  188   NEUT# 1.7 - 7.7 K/uL  6.2  7.3   Lymph# 0.7 - 4.0 K/uL  1.4  1.0      There is no height or weight on file to calculate BMI.  Orders:  Orders Placed This Encounter  Procedures   XR Shoulder Right   No orders of the defined types were placed in this encounter.    Procedures: Large Joint Inj: R subacromial bursa on 12/20/2022 4:31 PM Indications: diagnostic evaluation and pain Details: 22 G 1.5 in needle, posterior approach  Arthrogram: No  Medications:  5 mL lidocaine 1 %; 40 mg methylPREDNISolone acetate 40 MG/ML Outcome: tolerated well, no immediate complications Procedure, treatment alternatives, risks and benefits explained, specific risks discussed. Consent was given by the patient. Immediately prior to procedure a time out was called to verify the correct patient, procedure, equipment, support staff and site/side marked as required. Patient was prepped and draped in the usual sterile fashion.      Clinical Data: No additional findings.  ROS:  All other systems negative, except as noted in the HPI. Review of Systems  Objective: Vital Signs: There were no vitals taken for this visit.  Specialty Comments:  No specialty comments available.  PMFS History: Patient Active Problem List   Diagnosis Date Noted   Cholecystitis with cholelithiasis 07/17/2022   AKI (acute kidney injury) (HCC) 07/17/2022   Hypomagnesemia 08/25/2019   Chronic systolic CHF (congestive heart failure) (HCC) 08/21/2019    Atrial flutter (HCC) 10/23/2018   Acute deep vein thrombosis (DVT) of upper extremity (HCC)    Ileus (HCC)    SBO (small bowel obstruction) (HCC) 07/23/2018   Small bowel obstruction (HCC) 07/22/2018   Atrial fibrillation (HCC) 05/20/2018   Renal infarct (HCC)    Hyperlipidemia 11/27/2016   Bilateral inguinal hernia without obstruction or gangrene 03/07/2016   Incisional hernia, without obstruction or gangrene 03/07/2016   Persistent atrial fibrillation/flutter 06/21/2015   TIA (transient ischemic attack) 03/24/2015   CAD (coronary artery disease) 12/26/2014   COPD (chronic obstructive pulmonary disease) (HCC) 12/26/2014   Hypothyroidism 12/26/2014   Cerebral embolism with cerebral infarction 12/25/2014   Past Medical History:  Diagnosis Date   Arthritis    Complication of anesthesia    Coronary artery disease    Hypertension    Hypothyroidism    PAF (paroxysmal atrial fibrillation) (HCC)    on Xarelto for maybe a year, opted to stop   PONV (postoperative nausea and vomiting)    Renal infarction (HCC)    Small bowel obstruction (HCC) 06/2015   Stroke (HCC)    oct 2016   TIA (transient ischemic attack) 03/24/2015   Vertigo, benign positional    to be evaluated, intermittent    Family History  Problem Relation Age of Onset   Heart attack Father 71       Died age 45   Stroke Mother    Cancer Sister        breast    Past Surgical History:  Procedure Laterality Date   ABDOMINAL HYSTERECTOMY     APPLICATION OF WOUND VAC N/A 07/23/2018   Procedure: APPLICATION OF WOUND VAC;  Surgeon: Emelia Loron, MD;  Location: [facility] OR;  Service: General;  Laterality: N/A;   BOWEL RESECTION N/A 07/23/2018   Procedure: SMALL BOWEL RESECTION;  Surgeon: Emelia Loron, MD;  Location: [facility] OR;  Service: General;  Laterality: N/A;   CARDIAC CATHETERIZATION N/A 12/24/2014   Procedure: Left Heart Cath and Coronary Angiography;  Surgeon: Tonny Bollman, MD;  Location: [facility] INVASIVE CV LAB;   Service: Cardiovascular;  Laterality: N/A;   CARDIOVERSION N/A 05/22/2018   Procedure: CARDIOVERSION;  Surgeon: Wendall Stade, MD;  Location: [facility] ENDOSCOPY;  Service: Cardiovascular;  Laterality: N/A;   CATARACT EXTRACTION Bilateral    COLONOSCOPY WITH PROPOFOL N/A 12/01/2013   Procedure: COLONOSCOPY WITH PROPOFOL;  Surgeon: Charolett Bumpers, MD;  Location: [facility] ENDOSCOPY;  Service: Endoscopy;  Laterality: N/A;   COMPLEX WOUND CLOSURE  07/25/2018   Procedure: Complex Wound Closure;  Surgeon: Emelia Loron, MD;  Location: [facility] OR;  Service:  General;;   Ferne Coe  07/25/2018   Procedure: Ileocecetomy;  Surgeon: Emelia Loron, MD;  Location: [facility] OR;  Service: General;;   INCISIONAL HERNIA REPAIR N/A 03/10/2016   Procedure: INCISIONAL HERNIA REPAIR TIMES TWO;  Surgeon: Darnell Level, MD;  Location: [facility] OR;  Service: General;  Laterality: N/A;   INGUINAL HERNIA REPAIR Bilateral 03/10/2016   Procedure: BILATERAL INGUINAL HERNIA REPAIRS;  Surgeon: Darnell Level, MD;  Location: [facility] OR;  Service: General;  Laterality: Bilateral;   INSERTION OF MESH N/A 03/10/2016   Procedure: INSERTION OF MESH TO BILATERAL GROINS AND ABDOMEN;  Surgeon: Darnell Level, MD;  Location: [facility] OR;  Service: General;  Laterality: N/A;   IR CHOLANGIOGRAM EXISTING TUBE  09/01/2022   IR EXCHANGE BILIARY DRAIN  10/02/2022   IR EXCHANGE BILIARY DRAIN  11/03/2022   IR PERC CHOLECYSTOSTOMY  07/18/2022   IR RADIOLOGIST EVAL & MGMT  10/16/2022   IR RADIOLOGIST EVAL & MGMT  12/06/2022   IR REMOVAL OF CALCULI/DEBRIS BILIARY DUCT/GB  11/03/2022   laparotomy  06/2015   LAPAROTOMY N/A 07/23/2018   Procedure: EXPLORATORY LAPAROTOMY;  Surgeon: Emelia Loron, MD;  Location: [facility] OR;  Service: General;  Laterality: N/A;   LEFT HEART CATH AND CORONARY ANGIOGRAPHY N/A 09/27/2016   Procedure: Left Heart Cath and Coronary Angiography;  Surgeon: Tonny Bollman, MD;  Location: [facility] INVASIVE CV LAB;  Service: Cardiovascular;  Laterality: N/A;   TONSILLECTOMY      Social History   Occupational History   Occupation: retired  Tobacco Use   Smoking status: Never   Smokeless tobacco: Never  Vaping Use   Vaping status: Never Used  Substance and Sexual Activity   Alcohol use: Not Currently    Comment: occ.   Drug use: No   Sexual activity: Not on file

## 2023-01-01 ENCOUNTER — Other Ambulatory Visit: Payer: Self-pay | Admitting: Orthopedic Surgery

## 2023-01-01 MED ORDER — PREDNISONE 10 MG PO TABS: 10.0000 mg | ORAL_TABLET | Freq: Every day | ORAL | 0 refills | Status: AC

## 2023-01-08 ENCOUNTER — Encounter: Payer: Self-pay | Admitting: Cardiovascular Disease

## 2023-01-08 NOTE — Progress Notes (Unsigned)
Cardiology Office Note   Date:  01/09/2023   ID:  Rachel Park, DOB [birth year], MRN [id number]  PCP:  Cleatis Polka., MD  Cardiologist:   Kristeen Miss, MD   Chief Complaint  Patient presents with   Atrial Fibrillation        Coronary Artery Disease          problem list 1. Coronary artery  disease-status post stenting of her proximal LAD 2.  CVA- occurred during the stenting 3.  Atrial Fibrillation    Previous notes  Rachel Park is a 89 y.o. female who presents for follow up of her stenting She has done well.  Is a bit anxious - may account for her elevated BP today .  Memory has improved ,  No residual CVA symptoms.   Feb. 1, 2017: Doing well Has some fatigue. Has some bruising on lower logs  -  On Plavix   June 21, 2015:  Rachel Park was diagnosed with atrial fib, was started on eliquis 5 mg BID.  She was in [place] with her sister. Developed abdominal pain .   Severe hypoxemia .   In great distress. Was found to have a small bowell perforation .   Was in the ICU for 2 days.   Developed hypotension , was started on levophed,   Developed atrial fib  plavix was stopped,   Heparin was started.   Has converted to NSR at this time  Has had some band like chest pain since that time Has shortness of breath - at rest and with exertion .     September 22, 2015:  Rachel Park is doing well.  We DC'd her Eliquis - due to bleeding.   She is on Plavix  Her only episode of a-fib was in the setting of a small bowel obstruction   Is very fatigued.   Was put on Iron tablet by Dr. Pete Glatter. Hb is still low  Rachel Park also increased her Coreg to 25 BID .  BP readings at home have been well controlled.  Has not had any syncope Goes for a walk every day and is fatigued when she is done with the walk.   No CP .  Has occasional chest tightness .  Same symptoms back in April - Steffanie Dunn was normal in April, 2017   Feb. 16, 2018:  Rachel Park is seen today . Has had  surgery to repair 4 hernias.   Cant get her energy  Its only been a month - I reminded her that it takes time to heal after surgery .  No CP or dyspnea.   Sept. 24, 2018:  Rachel Park is seen today  Had some CP in July Repeat cath shows no obstructive CAD ,  LAD stent was widely patent  Has had some bursitis of left hip and knee.  No CP .   Thought to have reflux.   Tried Zantac, caused diarrhea, did not continue the zantac.   She has tried Crestor 10 mg in the past. But we had to decrease the dose to 5 g because of muscle aches. She thinks that she might be eating little bit more than she should.   May 23, 2017:  Doing well.  Has been working with the pharmacist at Dr. Lesia Sago office  They are concerned about her HR in the 50s .  Has not had any syncope.   Has profound lack of energy - several hours after taking the coreg  Sept. 17, 2019: Zeda is seen for follow up of her CAD and PAF HR is typically low. Has not had any further episodes of presyncope since we reduced the dose of Coreg. Still eats a bit more salt than she should   September 24, 2018  Rachel Park was admitted with ischemia bowel on Jul 23, 2018.   She had a prolonged ICU stay and developed AF with RVR.   She had a prolonged hospitalization with slow recovery.  She was started on Eliquis at the time of discharge.   She diuresed well   She was seen by Nada Boozer, NP a month ago .  Seemed to be doing well,  Was started back on amiodarone. Her torsemide has been changed to PRN  Breathing is better.  Eating well.   Abdomen is better.   Aug. 19, 2020   Rachel Park is seen today . She was seen a month ago and was set up for a cardioversion.  She had been started on amiodarone and was in NSR when she showed up for her cardioverison She reported some tachycardia and generalized weakness  recently. She was thought to have developed recurrent AF and her amiodarone was increased to 200 mg BID .   Sept. 10, 2020  Rachel Park is  seen for follow up of her atrial flutter. I saw her several weeks ago and found her to be in atrial flutter.   We doubled her amiodarone and had her return today for follow up  Nov. 11 , 2020  Rachel Park is seen today for follow up of atrial flutter  Ankles have been swollen for the past month She had run out of her lasix that she had received from the hospital  She takes lasix 20 mg as needed - has not needed it for the past 3 weeks.   Jan. 12, 2021  Rachel Park is seen today for follow up of her CAD and atrial fib She has persistent AF.   We stopped her amiodarone several months ago and have gone with a rate control and anticoagulation strategy Has had more fatigue recently .    June 16, 2019:  Rachel Park is seen today for follow-up visit.  She has atrial fibrillation.  We have adopted a rate control and anticoagulation strategy.  Blood pressure and heart rate look good. Doesn't have much energy.  No real muscle weakness.  She walks every am for 40 minutes  Brought her HR and BP log.   Overall her HR and BP have been well controled.   August 11, 2019:  Rachel Park is seen today for a work in visit because of persistent rapid atrial fibrillation.  Her blood pressure is also  Low.  We had stopped her metoprolol and put her on diltiazem due to fatigue.  She has been on diltiazem but her heart rate has been not as well controlled..  Her fatigue is no different even off the metoprolol  She has no signs or symptoms of any underlying infection.  She is eating and drinking well.  Oct. 13, 2021: Rachel Park is seen today for follow up of her atrial fib, CAD She has continued to be fatigued She has been tried on tikosyn ( March 2020), failed to stay in rhythm.  Tried amiodarone.  She is more short of breath recently .  Has lots of fatigue  She has seen Dr. Elberta Fortis .  They discussed possible AV node ablation and pacer placement  Is on lasix for ankle edema  June 28, 2020: Rachel Park is seen today for follow up of  her atrial fib Has chronic fatigue  Is not sleeping well at night.   Wakes up 3-5 times at night  Her memory is not as good as she would like   Oct. 24, 2022 Rachel Park is seen today for follow up of her atrial fib, CAD She stopped her laisx on Sept. Her BP was too low and she was going to the bathroom all the time  Her recent BP look good .  She has chronic dyspena Her dyspnea has not worsened .  We discussed taking the lasix  Still taking the spironolactone 25 mg a day  Has been started on Fosamax.   Oct. 30, 2023  Rachel Park is seen for follow up of her atrial fib, CAD   Short of breath on occasion  Looks great for 89 yo   Remains in atrial fib   Had brain MRI to r/o hydrocephalus   She is on Eliquis 2.5 mg twice a day.  She is 89 years old.  Creatinine is fairly normal and her weight is 64 kg.  She has had some bleeding hemorrhoids on rare occasion but I cannot find any other reason why we might have her on the low-dose Eliquis.  Since she is doing so well and is not having bleeding, I think we should increase her Eliquis up to 5 mg twice a day     Past Medical History:  Diagnosis Date   Arthritis    Complication of anesthesia    Coronary artery disease    Hypertension    Hypothyroidism    PAF (paroxysmal atrial fibrillation) (HCC)    on Xarelto for maybe a year, opted to stop   PONV (postoperative nausea and vomiting)    Renal infarction (HCC)    Small bowel obstruction (HCC) 06/2015   Stroke (HCC)    oct 2016   TIA (transient ischemic attack) 03/24/2015   Vertigo, benign positional    to be evaluated, intermittent    Past Surgical History:  Procedure Laterality Date   ABDOMINAL HYSTERECTOMY     APPLICATION OF WOUND VAC N/A 07/23/2018   Procedure: APPLICATION OF WOUND VAC;  Surgeon: Emelia Loron, MD;  Location: [facility] OR;  Service: General;  Laterality: N/A;   BOWEL RESECTION N/A 07/23/2018   Procedure: SMALL BOWEL RESECTION;  Surgeon: Emelia Loron, MD;   Location: [facility] OR;  Service: General;  Laterality: N/A;   CARDIAC CATHETERIZATION N/A 12/24/2014   Procedure: Left Heart Cath and Coronary Angiography;  Surgeon: Tonny Bollman, MD;  Location: [facility] INVASIVE CV LAB;  Service: Cardiovascular;  Laterality: N/A;   CARDIOVERSION N/A 05/22/2018   Procedure: CARDIOVERSION;  Surgeon: Wendall Stade, MD;  Location: [facility] ENDOSCOPY;  Service: Cardiovascular;  Laterality: N/A;   CATARACT EXTRACTION Bilateral    COLONOSCOPY WITH PROPOFOL N/A 12/01/2013   Procedure: COLONOSCOPY WITH PROPOFOL;  Surgeon: Charolett Bumpers, MD;  Location: [facility] ENDOSCOPY;  Service: Endoscopy;  Laterality: N/A;   COMPLEX WOUND CLOSURE  07/25/2018   Procedure: Complex Wound Closure;  Surgeon: Emelia Loron, MD;  Location: [facility] OR;  Service: General;;   Ferne Coe  07/25/2018   Procedure: Ileocecetomy;  Surgeon: Emelia Loron, MD;  Location: [facility] OR;  Service: General;;   INCISIONAL HERNIA REPAIR N/A 03/10/2016   Procedure: INCISIONAL HERNIA REPAIR TIMES TWO;  Surgeon: Darnell Level, MD;  Location: [facility] OR;  Service: General;  Laterality: N/A;   INGUINAL HERNIA REPAIR Bilateral 03/10/2016   Procedure: BILATERAL INGUINAL  HERNIA REPAIRS;  Surgeon: Darnell Level, MD;  Location: [facility] OR;  Service: General;  Laterality: Bilateral;   INSERTION OF MESH N/A 03/10/2016   Procedure: INSERTION OF MESH TO BILATERAL GROINS AND ABDOMEN;  Surgeon: Darnell Level, MD;  Location: [facility] OR;  Service: General;  Laterality: N/A;   IR CHOLANGIOGRAM EXISTING TUBE  09/01/2022   IR EXCHANGE BILIARY DRAIN  10/02/2022   IR EXCHANGE BILIARY DRAIN  11/03/2022   IR PERC CHOLECYSTOSTOMY  07/18/2022   IR RADIOLOGIST EVAL & MGMT  10/16/2022   IR RADIOLOGIST EVAL & MGMT  12/06/2022   IR REMOVAL OF CALCULI/DEBRIS BILIARY DUCT/GB  11/03/2022   laparotomy  06/2015   LAPAROTOMY N/A 07/23/2018   Procedure: EXPLORATORY LAPAROTOMY;  Surgeon: Emelia Loron, MD;  Location: [facility] OR;  Service: General;  Laterality: N/A;   LEFT HEART CATH AND  CORONARY ANGIOGRAPHY N/A 09/27/2016   Procedure: Left Heart Cath and Coronary Angiography;  Surgeon: Tonny Bollman, MD;  Location: [facility] INVASIVE CV LAB;  Service: Cardiovascular;  Laterality: N/A;   TONSILLECTOMY       Current Outpatient Medications  Medication Sig Dispense Refill   acetaminophen (TYLENOL) 500 MG tablet Take 1,000 mg by mouth every 6 (six) hours as needed for mild pain.      alendronate (FOSAMAX) 70 MG tablet Take 70 mg by mouth once a week. Saturday     apixaban (ELIQUIS) 5 MG TABS tablet Take 1 tablet (5 mg total) by mouth 2 (two) times daily. 60 tablet 5   Cholecalciferol (VITAMIN D3) 50 MCG (2000 UT) TABS Take 2,000 Units by mouth daily with lunch.     diltiazem (TIAZAC) 300 MG 24 hr capsule Take 300 mg by mouth daily.     famotidine (PEPCID) 10 MG tablet Take 10 mg by mouth daily as needed for heartburn.     levothyroxine (SYNTHROID) 88 MCG tablet Take 88 mcg by mouth every morning.     metoprolol succinate (TOPROL-XL) 100 MG 24 hr tablet Take 1 tablet (100 mg total) by mouth 2 (two) times daily. Take with or immediately following a meal. 180 tablet 3   NITROSTAT 0.4 MG SL tablet Place 1 tablet (0.4 mg total) under the tongue every 5 (five) minutes as needed. Chest pain 25 tablet 6   ondansetron (ZOFRAN) 4 MG tablet Take 1 tablet (4 mg total) by mouth every 6 (six) hours as needed for nausea. 20 tablet 0   oxyCODONE (OXY IR/ROXICODONE) 5 MG immediate release tablet Take 0.5-1 tablets (2.5-5 mg total) by mouth every 6 (six) hours as needed for severe pain or moderate pain. 20 tablet 0   Polyethyl Glycol-Propyl Glycol 0.4-0.3 % SOLN Place 1-2 drops into both eyes daily as needed (for dry eye).     predniSONE (DELTASONE) 10 MG tablet Take 1 tablet (10 mg total) by mouth daily with breakfast. 30 tablet 0   rosuvastatin (CRESTOR) 10 MG tablet Take 1 tablet by mouth once daily (Patient taking differently: Take 10 mg by mouth daily.) 90 tablet 1   sodium chloride flush 0.9 % SOLN  injection Instill 5 mL into the drain as needed. Discard extra 5ml from syringe once used 300 mL 1   spironolactone (ALDACTONE) 25 MG tablet Take 1 tablet by mouth once daily (Patient taking differently: Take 25 mg by mouth daily.) 90 tablet 2   No current facility-administered medications for this visit.    Allergies:   Propofol and Simvastatin    Social History:  The patient  reports that  she has never smoked. She has never used smokeless tobacco. She reports that she does not currently use alcohol. She reports that she does not use drugs.   Family History:  The patient's family history includes Cancer in her sister; Heart attack (age of onset: 68) in her father; Stroke in her mother.    ROS:      Physical Exam: Blood pressure (!) 108/48, pulse 79, height 5\' 2"  (1.575 m), weight 142 lb (64.4 kg), SpO2 99%.       GEN:  Well nourished, well developed in no acute distress HEENT: Normal NECK: No JVD; No carotid bruits LYMPHATICS: No lymphadenopathy CARDIAC: ireg. Irreg.  RESPIRATORY:  Clear to auscultation without rales, wheezing or rhonchi  ABDOMEN: Soft, non-tender, non-distended MUSCULOSKELETAL:  no significant edema ; No deformity  SKIN: Warm and dry NEUROLOGIC:  Alert and oriented x 3   EKG:      Recent Labs: 07/20/2022: ALT 36; Magnesium 2.0 11/03/2022: BUN 15; Creatinine, Ser 1.06; Hemoglobin 11.9; Platelets 241; Potassium 4.3; Sodium 136    Lipid Panel    Component Value Date/Time   CHOL 123 06/28/2020 0924   TRIG 78 06/28/2020 0924   HDL 67 06/28/2020 0924   CHOLHDL 1.8 06/28/2020 0924   CHOLHDL 1.9 08/22/2019 0552   VLDL 11 08/22/2019 0552   LDLCALC 41 06/28/2020 0924      Wt Readings from Last 3 Encounters:  01/09/23 142 lb (64.4 kg)  11/03/22 140 lb (63.5 kg)  07/17/22 152 lb 1.9 oz (69 kg)      Other studies Reviewed: Additional studies/ records that were reviewed today include: . Review of the above records demonstrates:    ASSESSMENT AND  PLAN:  1.  CAD -    s/p stenting , stable, no angina   2. Persistent  atrial fib/ atrial flutter  - CHADS2VASC score of 4-6  ( female, age > 13, vascular disease,   TIA and a CVA ( as a complication to her cath)     She has been on eliquis 2.5 BID . She has had some bleeding hemorrhoids on rare occasion but these have not been much of a problem.  Will increase her Eliquis to 5 mg twice a day.  I will see her again in 6 months for follow-up visit.  Check bmp, CBC , lipids in 6 months    3.  Pulmonary Hypertension:        3. Hyperlipidemia:   managed by Dr. Clelia Croft.   5.  Hypertension:  well controlled.   6.  Chronic diastolic congestive heart failure: Fluid management seems to be fairly well controlled on the spironolactone.  She does not appear to need Lasix at this point.  We will continue to watch her closely.  Current medicines are reviewed at length with the patient today.  The patient does not have concerns regarding medicines.  The following changes have been made:  no change  Labs/ tests ordered today include:   No orders of the defined types were placed in this encounter.     Disposition:     Kristeen Miss, MD  01/09/2023 10:17 AM    [facility] [address] [place], [place], [place]  [address] Phone: [phone]; Fax: [phone]

## 2023-01-09 ENCOUNTER — Ambulatory Visit: Payer: Medicare HMO | Attending: Cardiovascular Disease | Admitting: Cardiovascular Disease

## 2023-01-09 ENCOUNTER — Encounter: Payer: Self-pay | Admitting: Cardiovascular Disease

## 2023-01-09 VITALS — BP 108/48 | HR 79 | Ht 62.0 in | Wt 142.0 lb

## 2023-01-09 DIAGNOSIS — I10 Essential (primary) hypertension: Secondary | ICD-10-CM

## 2023-01-09 DIAGNOSIS — I2510 Atherosclerotic heart disease of native coronary artery without angina pectoris: Secondary | ICD-10-CM | POA: Diagnosis not present

## 2023-01-09 DIAGNOSIS — I4821 Permanent atrial fibrillation: Secondary | ICD-10-CM

## 2023-01-09 MED ORDER — APIXABAN 5 MG PO TABS: 5.0000 mg | ORAL_TABLET | Freq: Two times a day (BID) | ORAL | 5 refills | Status: AC

## 2023-01-09 NOTE — Patient Instructions (Addendum)
Medication Instructions:  Your physician has recommended you make the following change in your medication:  1.) increase Eliquis to 5 mg - one tablet twice daily  *If you need a refill on your cardiac medications before your next appointment, please call your pharmacy*   Lab Work: Return in 6 months for blood work (lipids, alt, bmet, cbc)   Testing/Procedures: none   Follow-Up: At [facility], you and your health needs are our priority.  As part of our continuing mission to provide you with exceptional heart care, we have created designated Provider Care Teams.  These Care Teams include your primary Cardiologist (physician) and Advanced Practice Providers (APPs -  Physician Assistants and Nurse Practitioners) who all work together to provide you with the care you need, when you need it.   Your next appointment:   6 month(s)  Provider:   Kristeen Miss, MD

## 2023-03-22 DIAGNOSIS — Z961 Presence of intraocular lens: Secondary | ICD-10-CM | POA: Diagnosis not present

## 2023-03-22 DIAGNOSIS — H04123 Dry eye syndrome of bilateral lacrimal glands: Secondary | ICD-10-CM | POA: Diagnosis not present

## 2023-03-22 DIAGNOSIS — H524 Presbyopia: Secondary | ICD-10-CM | POA: Diagnosis not present

## 2023-03-22 DIAGNOSIS — H5000 Unspecified esotropia: Secondary | ICD-10-CM | POA: Diagnosis not present

## 2023-04-11 DIAGNOSIS — Z008 Encounter for other general examination: Secondary | ICD-10-CM | POA: Diagnosis not present

## 2023-04-12 DIAGNOSIS — N390 Urinary tract infection, site not specified: Secondary | ICD-10-CM | POA: Diagnosis not present

## 2023-05-15 DIAGNOSIS — I272 Pulmonary hypertension, unspecified: Secondary | ICD-10-CM | POA: Diagnosis not present

## 2023-05-15 DIAGNOSIS — Z7189 Other specified counseling: Secondary | ICD-10-CM | POA: Diagnosis not present

## 2023-05-15 DIAGNOSIS — I110 Hypertensive heart disease with heart failure: Secondary | ICD-10-CM | POA: Diagnosis not present

## 2023-05-15 DIAGNOSIS — N390 Urinary tract infection, site not specified: Secondary | ICD-10-CM | POA: Diagnosis not present

## 2023-05-15 DIAGNOSIS — I2510 Atherosclerotic heart disease of native coronary artery without angina pectoris: Secondary | ICD-10-CM | POA: Diagnosis not present

## 2023-05-15 DIAGNOSIS — I5022 Chronic systolic (congestive) heart failure: Secondary | ICD-10-CM | POA: Diagnosis not present

## 2023-05-15 DIAGNOSIS — E785 Hyperlipidemia, unspecified: Secondary | ICD-10-CM | POA: Diagnosis not present

## 2023-05-15 DIAGNOSIS — F02A0 Dementia in other diseases classified elsewhere, mild, without behavioral disturbance, psychotic disturbance, mood disturbance, and anxiety: Secondary | ICD-10-CM | POA: Diagnosis not present

## 2023-05-15 DIAGNOSIS — I4821 Permanent atrial fibrillation: Secondary | ICD-10-CM | POA: Diagnosis not present

## 2023-05-15 DIAGNOSIS — D6869 Other thrombophilia: Secondary | ICD-10-CM | POA: Diagnosis not present

## 2023-05-15 DIAGNOSIS — I700 Atherosclerosis of aorta: Secondary | ICD-10-CM | POA: Diagnosis not present

## 2023-05-15 DIAGNOSIS — G300 Alzheimer's disease with early onset: Secondary | ICD-10-CM | POA: Diagnosis not present

## 2023-09-11 DIAGNOSIS — R300 Dysuria: Secondary | ICD-10-CM | POA: Diagnosis not present

## 2023-09-11 DIAGNOSIS — N390 Urinary tract infection, site not specified: Secondary | ICD-10-CM | POA: Diagnosis not present

## 2023-09-20 DIAGNOSIS — N952 Postmenopausal atrophic vaginitis: Secondary | ICD-10-CM | POA: Diagnosis not present

## 2023-09-20 DIAGNOSIS — N3020 Other chronic cystitis without hematuria: Secondary | ICD-10-CM | POA: Diagnosis not present

## 2023-09-20 DIAGNOSIS — N3941 Urge incontinence: Secondary | ICD-10-CM | POA: Diagnosis not present

## 2023-09-20 DIAGNOSIS — R3914 Feeling of incomplete bladder emptying: Secondary | ICD-10-CM | POA: Diagnosis not present

## 2023-09-21 ENCOUNTER — Encounter: Payer: Self-pay | Admitting: Advanced Practice Midwife

## 2023-10-22 DIAGNOSIS — N3020 Other chronic cystitis without hematuria: Secondary | ICD-10-CM | POA: Diagnosis not present

## 2023-10-24 ENCOUNTER — Emergency Department (HOSPITAL_COMMUNITY)

## 2023-10-24 ENCOUNTER — Other Ambulatory Visit: Payer: Self-pay

## 2023-10-24 ENCOUNTER — Emergency Department (HOSPITAL_COMMUNITY)
Admission: EM | Admit: 2023-10-24 | Discharge: 2023-10-24 | Disposition: A | Discharge status: Home / Self Care | Attending: Emergency Medicine | Admitting: Emergency Medicine

## 2023-10-24 DIAGNOSIS — W19XXXA Unspecified fall, initial encounter: Secondary | ICD-10-CM

## 2023-10-24 DIAGNOSIS — S61411A Laceration without foreign body of right hand, initial encounter: Secondary | ICD-10-CM | POA: Diagnosis not present

## 2023-10-24 DIAGNOSIS — W01198A Fall on same level from slipping, tripping and stumbling with subsequent striking against other object, initial encounter: Secondary | ICD-10-CM | POA: Diagnosis not present

## 2023-10-24 DIAGNOSIS — S80212A Abrasion, left knee, initial encounter: Secondary | ICD-10-CM | POA: Diagnosis not present

## 2023-10-24 DIAGNOSIS — G319 Degenerative disease of nervous system, unspecified: Secondary | ICD-10-CM | POA: Diagnosis not present

## 2023-10-24 DIAGNOSIS — S0101XA Laceration without foreign body of scalp, initial encounter: Secondary | ICD-10-CM | POA: Diagnosis not present

## 2023-10-24 DIAGNOSIS — S0083XA Contusion of other part of head, initial encounter: Secondary | ICD-10-CM

## 2023-10-24 DIAGNOSIS — S022XXA Fracture of nasal bones, initial encounter for closed fracture: Secondary | ICD-10-CM | POA: Diagnosis not present

## 2023-10-24 DIAGNOSIS — S80211A Abrasion, right knee, initial encounter: Secondary | ICD-10-CM | POA: Insufficient documentation

## 2023-10-24 DIAGNOSIS — Z743 Need for continuous supervision: Secondary | ICD-10-CM | POA: Diagnosis not present

## 2023-10-24 DIAGNOSIS — S0990XA Unspecified injury of head, initial encounter: Secondary | ICD-10-CM

## 2023-10-24 DIAGNOSIS — M79641 Pain in right hand: Secondary | ICD-10-CM | POA: Insufficient documentation

## 2023-10-24 DIAGNOSIS — I517 Cardiomegaly: Secondary | ICD-10-CM | POA: Diagnosis not present

## 2023-10-24 DIAGNOSIS — S0181XA Laceration without foreign body of other part of head, initial encounter: Secondary | ICD-10-CM | POA: Insufficient documentation

## 2023-10-24 DIAGNOSIS — Z7901 Long term (current) use of anticoagulants: Secondary | ICD-10-CM | POA: Diagnosis not present

## 2023-10-24 DIAGNOSIS — R58 Hemorrhage, not elsewhere classified: Secondary | ICD-10-CM | POA: Diagnosis not present

## 2023-10-24 DIAGNOSIS — Y9301 Activity, walking, marching and hiking: Secondary | ICD-10-CM | POA: Diagnosis not present

## 2023-10-24 DIAGNOSIS — Z043 Encounter for examination and observation following other accident: Secondary | ICD-10-CM | POA: Diagnosis not present

## 2023-10-24 DIAGNOSIS — M1811 Unilateral primary osteoarthritis of first carpometacarpal joint, right hand: Secondary | ICD-10-CM | POA: Diagnosis not present

## 2023-10-24 DIAGNOSIS — M1711 Unilateral primary osteoarthritis, right knee: Secondary | ICD-10-CM | POA: Diagnosis not present

## 2023-10-24 DIAGNOSIS — J9811 Atelectasis: Secondary | ICD-10-CM | POA: Diagnosis not present

## 2023-10-24 DIAGNOSIS — R9082 White matter disease, unspecified: Secondary | ICD-10-CM | POA: Diagnosis not present

## 2023-10-24 DIAGNOSIS — S61511A Laceration without foreign body of right wrist, initial encounter: Secondary | ICD-10-CM | POA: Diagnosis not present

## 2023-10-24 DIAGNOSIS — S0992XA Unspecified injury of nose, initial encounter: Secondary | ICD-10-CM | POA: Diagnosis not present

## 2023-10-24 LAB — CBC WITH DIFFERENTIAL/PLATELET
Abs Immature Granulocytes: 0.11 K/uL — ABNORMAL HIGH (ref 0.00–0.07)
Basophils Absolute: 0.1 K/uL (ref 0.0–0.1)
Basophils Relative: 1 %
Eosinophils Absolute: 0.2 K/uL (ref 0.0–0.5)
Eosinophils Relative: 1 %
HCT: 38.7 % (ref 36.0–46.0)
Hemoglobin: 12.7 g/dL (ref 12.0–15.0)
Immature Granulocytes: 1 %
Lymphocytes Relative: 15 %
Lymphs Abs: 1.6 K/uL (ref 0.7–4.0)
MCH: 31.7 pg (ref 26.0–34.0)
MCHC: 32.8 g/dL (ref 30.0–36.0)
MCV: 96.5 fL (ref 80.0–100.0)
Monocytes Absolute: 0.7 K/uL (ref 0.1–1.0)
Monocytes Relative: 6 %
Neutro Abs: 8.3 K/uL — ABNORMAL HIGH (ref 1.7–7.7)
Neutrophils Relative %: 76 %
Platelets: 228 K/uL (ref 150–400)
RBC: 4.01 MIL/uL (ref 3.87–5.11)
RDW: 13.1 % (ref 11.5–15.5)
WBC: 10.9 K/uL — ABNORMAL HIGH (ref 4.0–10.5)
nRBC: 0 % (ref 0.0–0.2)

## 2023-10-24 LAB — BASIC METABOLIC PANEL WITH GFR
Anion gap: 9 (ref 5–15)
BUN: 16 mg/dL (ref 8–23)
CO2: 21 mmol/L — ABNORMAL LOW (ref 22–32)
Calcium: 9.2 mg/dL (ref 8.9–10.3)
Chloride: 106 mmol/L (ref 98–111)
Creatinine, Ser: 0.84 mg/dL (ref 0.44–1.00)
GFR, Estimated: >60 mL/min (ref >60)
Glucose, Bld: 110 mg/dL — ABNORMAL HIGH (ref 70–99)
Potassium: 3.7 mmol/L (ref 3.5–5.1)
Sodium: 136 mmol/L (ref 135–145)

## 2023-10-24 MED ORDER — ACETAMINOPHEN 500 MG PO TABS
1000.0000 mg | ORAL_TABLET | Freq: Once | ORAL | Status: AC
  Administered 2023-10-24: 1000 mg via ORAL
  Filled 2023-10-24: qty 2

## 2023-10-24 MED ORDER — LIDOCAINE HCL (PF) 1 % IJ SOLN
10.0000 mL | Freq: Once | INTRAMUSCULAR | Status: DC
  Filled 2023-10-24: qty 10

## 2023-10-24 NOTE — ED Provider Notes (Signed)
 [facility] Provider Note   CSN: [id number] Arrival date & time: 10/24/23  0550     History Chief Complaint  Patient presents with   Fall    FOT    HPI [facility] is a 89 y.o. female presenting for chief complaint of fall.  Brought in as a level 2 trauma for ground-level fall on a blood thinner.  [facility] face first into a concrete post while going up the check for female.  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 Family ember at bedside states that she is otherwise at her mental status baseline at this time.  She endorses a headache, bilateral knee pain, right hand pain.  Large lacerations over the face.  Patient's recorded medical, surgical, social, medication list and allergies were reviewed in the Snapshot window as part of the initial history.   Review of Systems   Review of Systems  Constitutional:  Negative for chills and fever.  HENT:  Negative for ear pain and sore throat.   Eyes:  Negative for pain and visual disturbance.  Respiratory:  Negative for cough and shortness of breath.   Cardiovascular:  Negative for chest pain and palpitations.  Gastrointestinal:  Negative for abdominal pain and vomiting.  Genitourinary:  Negative for dysuria and hematuria.  Musculoskeletal:  Negative for arthralgias and back pain.  Skin:  Negative for color change and rash.  Neurological:  Positive for headaches. Negative for seizures and syncope.  All other systems reviewed and are negative.   Physical Exam Updated Vital Signs BP (!) 143/104   Pulse 78   Temp (!) 97.5 F (36.4 C) (Oral)   Resp (!) 21   Ht 5' 2 (1.575 m)   Wt 64.4 kg   SpO2 100%   BMI 25.97 kg/m  Physical Exam Vitals and nursing note reviewed.  Constitutional:      General: She is not in acute distress.    Appearance: She is well-developed.  HENT:     Head: Normocephalic and atraumatic.   Eyes:     Conjunctiva/sclera: Conjunctivae normal.  Cardiovascular:     Rate and Rhythm: Normal rate and regular rhythm.     Heart sounds: No murmur heard. Pulmonary:     Effort: Pulmonary effort is normal. No respiratory distress.     Breath sounds: Normal breath sounds.  Abdominal:     General: There is no distension.     Palpations: Abdomen is soft.     Tenderness: There is no abdominal tenderness. There is no right CVA tenderness or left CVA tenderness.  Musculoskeletal:        General: Signs of injury (Abrasions over the bilateral knees, skin tear over the right fore hand, laceration approximately 8 cm across the middle of the face, 2 separate 1 cm lacerations on the right lateral face.  1 cm laceration over the nose.) present. No swelling or tenderness. Normal range of motion.     Cervical back: Neck supple.  Skin:    General: Skin is warm and dry.  Neurological:     General: No focal deficit present.     Mental Status: She is alert and oriented to person, place, and time. Mental status is at baseline.     Cranial Nerves: No cranial nerve deficit.      ED Course/ Medical Decision Making/ A&P Clinical Course as of 10/24/23 2325  Wed  Oct 24, 2023  9167 Bilateral knee x-ray and chest x-ray show no acute traumatic findings.  Patient states her pain is well-controlled at this time.  I took another look at the skin avulsion over the dorsal aspect of her right hand and wrist and repaired this with Steri-Strips which seem to approximate the wound well.  Do not think sutures would hold given the fragility of her skin.  The wound was dressed with gauze wrap.  Appropriate for discharge [[facility]]    Clinical Course User Index [[facility]] [facility] LABOR, DO    Procedures .Critical Care  Performed by: [facility], MD Authorized by: [facility], MD   Critical care provider statement:    Critical care time (minutes):  30   Critical care was necessary to treat or prevent imminent or  life-threatening deterioration of the following conditions:  Trauma   Critical care was time spent personally by me on the following activities:  Development of treatment plan with patient or surrogate, discussions with consultants, evaluation of patient's response to treatment, examination of patient, ordering and review of laboratory studies, ordering and review of radiographic studies, ordering and performing treatments and interventions, pulse oximetry, re-evaluation of patient's condition and review of old charts .Laceration Repair  Date/Time: 10/24/2023 11:25 PM  Performed by: [facility], MD Authorized by: [facility], MD   Consent:    Consent obtained:  Verbal   Consent given by:  Patient   Risks, benefits, and alternatives were discussed: yes     Risks discussed:  Infection and pain Universal protocol:    Patient identity confirmed:  Verbally with patient Laceration details:    Location:  Face   Face location:  Forehead   Length (cm):  13 Exploration:    Hemostasis achieved with:  Direct pressure   Imaging outcome: foreign body not noted   Treatment:    Amount of cleaning:  Extensive Repair type:    Repair type:  Intermediate Post-procedure details:    Dressing:  Open (no dressing)   Procedure completion:  Tolerated Comments:     Approximately 13 cm of lacerations scattered over the face.    Medications Ordered in ED Medications  acetaminophen  (TYLENOL ) tablet 1,000 mg (1,000 mg Oral Given 10/24/23 0721)   Medical Decision Making:    [facility] is a 89 y.o. female who presented to the ED today with a high mechanisma trauma, detailed above.    By institutional and departmental policy this was activated as a level 2 trauma. Handoff received from EMS.  Additional history discussed with patient's family/caregivers.  Patient placed on continuous vitals and telemetry monitoring while in ED which was reviewed periodically.   Given this mechanism of trauma, a  full physical exam was performed.  Reviewed and confirmed nursing documentation for past medical history, family history, social history.    Initial Assessment/Plan:   I was called emergently to patient's bedside for a primary survey.  Primary survey: Airway intact.  BL breath sounds present.   Circulation established with WNL BP, 2 large bore IVs, and radial/femoral pulses.   Disability evaluation negative. No obvious disability requiring intervention.   Patient fully exposed and all injuries were noted, any penetrating injuries were labeled with radiopaque markers.  No emergent interventions took place in the primary survey.    Patient stable for CXR that demonstrated no traumatic hemopneumothorax and PXR that demonstrated no unstable pelvic fractures.  EFAST deferred.   Secondary survey: Once patient was stabilized, I personally performed  a secondary survey to evaluate for any other injuries.  Results of this evaluation documented in the physical exam section. This is a patient presenting with a high mechanism trauma.  As such, I have considered intracranial injuries including intracranial hemorrhage, intrathoracic injuries including blunt myocardial or blunt lung injury, blunt abdominal injuries including aortic dissection, bladder injury, spleen injury, liver injury and I have considered orthopedic injuries including extremity or spinal injury.   This was all evaluated by the below imaging as well as concurrently ordered laboratory evaluation which was reviewed.  Radiology: All radiology results were reviewed independently and agree with reads per radiology provider. DG Knee Complete 4 Views Left Result Date: 10/24/2023 CLINICAL DATA:  Status post fall EXAM: LEFT KNEE - COMPLETE 4+ VIEW; RIGHT KNEE - COMPLETE 4+ VIEW COMPARISON:  None available FINDINGS: LEFT KNEE: No acute osseous abnormality. Joint spaces maintained. Atherosclerotic changes seen throughout visualized arterial segments.  RIGHT KNEE: Mild degenerative changes of the patellofemoral and lateral compartments. No acute osseous abnormality. Atherosclerotic changes seen throughout visualized arterial segments. IMPRESSION: 1. No acute abnormality of the knees. 2. Mild degenerative changes of the RIGHT knee. Electronically Signed   By: Aliene Lloyd M.D.   On: 10/24/2023 08:20   DG Knee Complete 4 Views Right Result Date: 10/24/2023 CLINICAL DATA:  Status post fall EXAM: LEFT KNEE - COMPLETE 4+ VIEW; RIGHT KNEE - COMPLETE 4+ VIEW COMPARISON:  None available FINDINGS: LEFT KNEE: No acute osseous abnormality. Joint spaces maintained. Atherosclerotic changes seen throughout visualized arterial segments. RIGHT KNEE: Mild degenerative changes of the patellofemoral and lateral compartments. No acute osseous abnormality. Atherosclerotic changes seen throughout visualized arterial segments. IMPRESSION: 1. No acute abnormality of the knees. 2. Mild degenerative changes of the RIGHT knee. Electronically Signed   By: Aliene Lloyd M.D.   On: 10/24/2023 08:20   DG Chest Portable 1 View Result Date: 10/24/2023 CLINICAL DATA:  Fall. EXAM: PORTABLE CHEST - 1 VIEW COMPARISON:  08/21/2019 FINDINGS: Mild to moderate cardiomegaly, which appears slightly increased since prior examination. No pulmonary vascular congestion. Minimal LEFT basilar atelectasis. Lungs are hyperexpanded. No pneumothorax. IMPRESSION: Mild-to-moderate cardiomegaly. Electronically Signed   By: Aliene Lloyd M.D.   On: 10/24/2023 08:17   CT HEAD WO CONTRAST ( ) Result Date: 10/24/2023 CLINICAL DATA:  Fall with head injury. Level T12 Oma. Patient tripped wall walking and has laceration of forehead. EXAM: CT HEAD WITHOUT CONTRAST CT MAXILLOFACIAL WITHOUT CONTRAST CT CERVICAL SPINE WITHOUT CONTRAST TECHNIQUE: Multidetector CT imaging of the head, cervical spine, and maxillofacial structures were performed using the standard protocol without intravenous contrast. Multiplanar CT image  reconstructions of the cervical spine and maxillofacial structures were also generated. RADIATION DOSE REDUCTION: This exam was performed according to the departmental dose-optimization program which includes automated exposure control, adjustment of the mA and/or kV according to patient size and/or use of iterative reconstruction technique. COMPARISON:  Head CT 12/24/2014. FINDINGS: CT HEAD FINDINGS Brain: Posterior fossa is obscured by beam hardening artifact/motion artifact there is no evidence for acute hemorrhage, hydrocephalus, mass lesion, or abnormal extra-axial fluid collection. No definite CT evidence for acute infarction. Chronic lacunar infarct or dilated perivascular space in the left basal ganglia, stable. Patchy low attenuation in the deep hemispheric and periventricular white matter is nonspecific, but likely reflects chronic microvascular ischemic demyelination. Diffuse loss of parenchymal volume is consistent with atrophy. Vascular: No hyperdense vessel or unexpected calcification. Skull: No evidence for fracture. No worrisome lytic or sclerotic lesion. Other: Soft tissue contusion and laceration noted frontal  scalp. CT MAXILLOFACIAL FINDINGS Osseous: Minimally displaced nasal bone fractures evident with potential nondisplaced fracture of the anterior nasal septum. Medial and inferior orbital walls are intact bilaterally. No mandible fracture or dislocation. Zygomatic arches are intact. Orbits: Negative. No traumatic or inflammatory finding. Sinuses: Chronic mucosal disease noted in both maxillary sinuses. Remaining paranasal sinuses and mastoid air cells are clear. Soft tissues: Frontal scalp contusion evident. Gas within the soft tissues of the frontal scalp is consistent with associated laceration. As above, there is no underlying fracture of the frontal skull. CT CERVICAL SPINE FINDINGS Alignment: Normal. Skull base and vertebrae: No acute fracture. No primary bone lesion or focal pathologic  process. Soft tissues and spinal canal: No prevertebral fluid or swelling. No visible canal hematoma. Disc levels: Loss of disc height noted C4-5 C5-6 and C6-7 with minimal associated endplate degeneration. The facets are well aligned bilaterally. Upper chest: Unremarkable. Other: None. IMPRESSION: 1. No acute intracranial abnormality. Atrophy with chronic small vessel white matter ischemic disease. 2. Minimally displaced nasal bone fractures with potential nondisplaced fracture of the anterior nasal septum. No other maxillofacial fracture evident. 3. Frontal scalp contusion and laceration. No underlying skull fracture. 4. No evidence for cervical spine fracture or subluxation. Electronically Signed   By: Camellia Candle M.D.   On: 10/24/2023 06:39   CT CERVICAL SPINE WO CONTRAST Result Date: 10/24/2023 CLINICAL DATA:  Fall with head injury. Level T12 Oma. Patient tripped wall walking and has laceration of forehead. EXAM: CT HEAD WITHOUT CONTRAST CT MAXILLOFACIAL WITHOUT CONTRAST CT CERVICAL SPINE WITHOUT CONTRAST TECHNIQUE: Multidetector CT imaging of the head, cervical spine, and maxillofacial structures were performed using the standard protocol without intravenous contrast. Multiplanar CT image reconstructions of the cervical spine and maxillofacial structures were also generated. RADIATION DOSE REDUCTION: This exam was performed according to the departmental dose-optimization program which includes automated exposure control, adjustment of the mA and/or kV according to patient size and/or use of iterative reconstruction technique. COMPARISON:  Head CT 12/24/2014. FINDINGS: CT HEAD FINDINGS Brain: Posterior fossa is obscured by beam hardening artifact/motion artifact there is no evidence for acute hemorrhage, hydrocephalus, mass lesion, or abnormal extra-axial fluid collection. No definite CT evidence for acute infarction. Chronic lacunar infarct or dilated perivascular space in the left basal ganglia, stable.  Patchy low attenuation in the deep hemispheric and periventricular white matter is nonspecific, but likely reflects chronic microvascular ischemic demyelination. Diffuse loss of parenchymal volume is consistent with atrophy. Vascular: No hyperdense vessel or unexpected calcification. Skull: No evidence for fracture. No worrisome lytic or sclerotic lesion. Other: Soft tissue contusion and laceration noted frontal scalp. CT MAXILLOFACIAL FINDINGS Osseous: Minimally displaced nasal bone fractures evident with potential nondisplaced fracture of the anterior nasal septum. Medial and inferior orbital walls are intact bilaterally. No mandible fracture or dislocation. Zygomatic arches are intact. Orbits: Negative. No traumatic or inflammatory finding. Sinuses: Chronic mucosal disease noted in both maxillary sinuses. Remaining paranasal sinuses and mastoid air cells are clear. Soft tissues: Frontal scalp contusion evident. Gas within the soft tissues of the frontal scalp is consistent with associated laceration. As above, there is no underlying fracture of the frontal skull. CT CERVICAL SPINE FINDINGS Alignment: Normal. Skull base and vertebrae: No acute fracture. No primary bone lesion or focal pathologic process. Soft tissues and spinal canal: No prevertebral fluid or swelling. No visible canal hematoma. Disc levels: Loss of disc height noted C4-5 C5-6 and C6-7 with minimal associated endplate degeneration. The facets are well aligned bilaterally. Upper chest: Unremarkable. Other:  None. IMPRESSION: 1. No acute intracranial abnormality. Atrophy with chronic small vessel white matter ischemic disease. 2. Minimally displaced nasal bone fractures with potential nondisplaced fracture of the anterior nasal septum. No other maxillofacial fracture evident. 3. Frontal scalp contusion and laceration. No underlying skull fracture. 4. No evidence for cervical spine fracture or subluxation. Electronically Signed   By: Camellia Candle  M.D.   On: 10/24/2023 06:39   CT Maxillofacial Wo Contrast Result Date: 10/24/2023 CLINICAL DATA:  Fall with head injury. Level T12 Oma. Patient tripped wall walking and has laceration of forehead. EXAM: CT HEAD WITHOUT CONTRAST CT MAXILLOFACIAL WITHOUT CONTRAST CT CERVICAL SPINE WITHOUT CONTRAST TECHNIQUE: Multidetector CT imaging of the head, cervical spine, and maxillofacial structures were performed using the standard protocol without intravenous contrast. Multiplanar CT image reconstructions of the cervical spine and maxillofacial structures were also generated. RADIATION DOSE REDUCTION: This exam was performed according to the departmental dose-optimization program which includes automated exposure control, adjustment of the mA and/or kV according to patient size and/or use of iterative reconstruction technique. COMPARISON:  Head CT 12/24/2014. FINDINGS: CT HEAD FINDINGS Brain: Posterior fossa is obscured by beam hardening artifact/motion artifact there is no evidence for acute hemorrhage, hydrocephalus, mass lesion, or abnormal extra-axial fluid collection. No definite CT evidence for acute infarction. Chronic lacunar infarct or dilated perivascular space in the left basal ganglia, stable. Patchy low attenuation in the deep hemispheric and periventricular white matter is nonspecific, but likely reflects chronic microvascular ischemic demyelination. Diffuse loss of parenchymal volume is consistent with atrophy. Vascular: No hyperdense vessel or unexpected calcification. Skull: No evidence for fracture. No worrisome lytic or sclerotic lesion. Other: Soft tissue contusion and laceration noted frontal scalp. CT MAXILLOFACIAL FINDINGS Osseous: Minimally displaced nasal bone fractures evident with potential nondisplaced fracture of the anterior nasal septum. Medial and inferior orbital walls are intact bilaterally. No mandible fracture or dislocation. Zygomatic arches are intact. Orbits: Negative. No traumatic  or inflammatory finding. Sinuses: Chronic mucosal disease noted in both maxillary sinuses. Remaining paranasal sinuses and mastoid air cells are clear. Soft tissues: Frontal scalp contusion evident. Gas within the soft tissues of the frontal scalp is consistent with associated laceration. As above, there is no underlying fracture of the frontal skull. CT CERVICAL SPINE FINDINGS Alignment: Normal. Skull base and vertebrae: No acute fracture. No primary bone lesion or focal pathologic process. Soft tissues and spinal canal: No prevertebral fluid or swelling. No visible canal hematoma. Disc levels: Loss of disc height noted C4-5 C5-6 and C6-7 with minimal associated endplate degeneration. The facets are well aligned bilaterally. Upper chest: Unremarkable. Other: None. IMPRESSION: 1. No acute intracranial abnormality. Atrophy with chronic small vessel white matter ischemic disease. 2. Minimally displaced nasal bone fractures with potential nondisplaced fracture of the anterior nasal septum. No other maxillofacial fracture evident. 3. Frontal scalp contusion and laceration. No underlying skull fracture. 4. No evidence for cervical spine fracture or subluxation. Electronically Signed   By: Camellia Candle M.D.   On: 10/24/2023 06:39   DG Hand Complete Right Result Date: 10/24/2023 CLINICAL DATA:  Fall.  Skin tear to the right hand. EXAM: RIGHT HAND - COMPLETE 3+ VIEW COMPARISON:  None Available. FINDINGS: No evidence for an acute fracture. No subluxation or dislocation. Degenerative changes are seen at the first carpometacarpal joint. IMPRESSION: 1. No acute bony findings. 2. Degenerative changes at the first carpometacarpal joint. Electronically Signed   By: Camellia Candle M.D.   On: 10/24/2023 06:17   DG Pelvis Portable Result Date:  10/24/2023 CLINICAL DATA:  Fall. EXAM: PORTABLE PELVIS 1-2 VIEWS COMPARISON:  None Available. FINDINGS: No evidence for an acute fracture. No dislocation. SI joints and symphysis pubis  unremarkable. IMPRESSION: Negative. Electronically Signed   By: Camellia Candle M.D.   On: 10/24/2023 06:12    Final Reassessment and Plan:   Targeted studies only showed a nasal bridge fracture with no other injuries identified. Patient's tetanus up-to-date per primary All wounds cleaned, bandaged and repaired as above.  Pending knee x-rays and ambulation attempt at time of handoff to oncoming team.  Emergency Department Medication Summary:   Medications  acetaminophen  (TYLENOL ) tablet 1,000 mg (1,000 mg Oral Given 10/24/23 0721)     Clinical Impression:  1. Injury of head, initial encounter   2. Contusion of face, initial encounter   3. Fall, initial encounter   4. Closed fracture of nasal bone, initial encounter       Rx / DC Orders ED Discharge Orders     None         [facility], MD 10/24/23 2328

## 2023-10-24 NOTE — ED Notes (Signed)
 RN got patient completely undressed. Gauze applied to bilateral knees due to abrasions. RN cleaned forehead with wound cleanser and applied gauze to forehead. RN unwrapped EMS dressing to right hand and pt has major hand laceration, bleeding controlled. RN applied pressure dressing to right hand at this time. MD notified of laceration.

## 2023-10-24 NOTE — ED Triage Notes (Signed)
 PT WAS CHECKING MAIL AND TRIPPED WHILE WALKING. PT HIT HEAD ON CONCRETE. 3 INCH LAC TO FOREHEAD AND SKIN TEAR TO RIGHT HAND.NO LOC. GCS 15. PT ON ELIQUIS 

## 2023-10-24 NOTE — ED Notes (Signed)
 Right hand wrapped per [facility] DO. Paper work reviewed with PT and son. Pt is leaving for front loby of the ED to await son for pick up. Pt is in no new onset distress at this time.

## 2023-10-24 NOTE — ED Provider Notes (Signed)
  Physical Exam  BP (!) 164/92   Pulse 88   Temp 97.7 F (36.5 C) (Oral)   Resp 20   Ht 5' 2 (1.575 m)   Wt 64.4 kg   SpO2 100%   BMI 25.97 kg/m   Physical Exam Vitals and nursing note reviewed.  HENT:     Head: Normocephalic and atraumatic.  Eyes:     Pupils: Pupils are equal, round, and reactive to light.  Cardiovascular:     Rate and Rhythm: Normal rate and regular rhythm.  Pulmonary:     Effort: Pulmonary effort is normal.     Breath sounds: Normal breath sounds.  Abdominal:     Palpations: Abdomen is soft.     Tenderness: There is no abdominal tenderness.  Skin:    General: Skin is warm and dry.  Neurological:     Mental Status: She is alert.  Psychiatric:        Mood and Affect: Mood normal.     Procedures  .Laceration Repair  Date/Time: 10/24/2023 8:33 AM  Performed by: Pamella [facility] LABOR, DO Authorized by: Pamella [facility] LABOR, DO   Consent:    Consent obtained:  Verbal   Consent given by:  Patient   Risks discussed:  Infection   Alternatives discussed:  No treatment Universal protocol:    Immediately prior to procedure, a time out was called: yes     Patient identity confirmed:  Verbally with patient and hospital-assigned identification number Anesthesia:    Anesthesia method:  None Laceration details:    Location:  Hand   Hand location:  R wrist   Length (cm):  5   Depth (mm):  0.5 Pre-procedure details:    Preparation:  Patient was prepped and draped in usual sterile fashion Skin repair:    Repair method:  Steri-Strips   Number of Steri-Strips:  9 Approximation:    Approximation:  Close Repair type:    Repair type:  Simple Post-procedure details:    Dressing:  Sterile dressing   Procedure completion:  Tolerated   ED Course / MDM   Clinical Course as of 10/24/23 0836  Wed Oct 24, 2023  0832 Bilateral knee x-ray and chest x-ray show no acute traumatic findings.  Patient states her pain is well-controlled at this time.  I took another look  at the skin avulsion over the dorsal aspect of her right hand and wrist and repaired this with Steri-Strips which seem to approximate the wound well.  Do not think sutures would hold given the fragility of her skin.  The wound was dressed with gauze wrap.  Appropriate for discharge [MP]    Clinical Course User Index [MP] Pamella Ozell LABOR, DO   Medical Decision Making I, Ozell Pamella DO, have assumed care of this patient from the previous provider pending bilateral knee x-rays reevaluation and disposition  Amount and/or Complexity of Data Reviewed Labs: ordered. Radiology: ordered.  Risk OTC drugs. Prescription drug management.          Pamella Ozell LABOR, DO 10/24/23 [id number]

## 2023-10-24 NOTE — Progress Notes (Incomplete)
 ..Trauma Response Nurse Documentation   [facility] is a 89 y.o. female arriving to [facility] via EMS  On Eliquis  (apixaban ) daily. Trauma was activated as a Level 2 by charge nurse based on the following trauma criteria Elderly patients > 65 with head trauma on anti-coagulation (excluding ASA).  Patient cleared for CT by Dr. Jerral. Pt transported to CT with trauma response nurse present to monitor. RN remained with the patient throughout their absence from the department for clinical observation.   [facility] 14.  [facility] Arrival Time: N/A  History   Past Medical History:  Diagnosis Date   Arthritis    Complication of anesthesia    Coronary artery disease    Hypertension    Hypothyroidism    PAF (paroxysmal atrial fibrillation) (HCC)    on Xarelto  for maybe a year, opted to stop   PONV (postoperative nausea and vomiting)    Renal infarction (HCC)    Small bowel obstruction (HCC) 06/2015   Stroke (HCC)    oct 2016   TIA (transient ischemic attack) 03/24/2015   Vertigo, benign positional    to be evaluated, intermittent     Past Surgical History:  Procedure Laterality Date   ABDOMINAL HYSTERECTOMY     APPLICATION OF WOUND VAC N/A 07/23/2018   Procedure: APPLICATION OF WOUND VAC;  Surgeon: [facility];  Location: [facility] OR;  Service: General;  Laterality: N/A;   BOWEL RESECTION N/A 07/23/2018   Procedure: SMALL BOWEL RESECTION;  Surgeon: [facility];  Location: [facility] OR;  Service: General;  Laterality: N/A;   CARDIAC CATHETERIZATION N/A 12/24/2014   Procedure: Left Heart Cath and Coronary Angiography;  Surgeon: [facility]ell, [facility];  Location: [facility] INVASIVE CV LAB;  Service: Cardiovascular;  Laterality: N/A;   CARDIOVERSION N/A 05/22/2018   Procedure: CARDIOVERSION;  Surgeon: [facility]ude BROCKS, [facility];  Location: [facility] ENDOSCOPY;  Service: Cardiovascular;  Laterality: N/A;   CATARACT EXTRACTION Bilateral    COLONOSCOPY WITH PROPOFOL  N/A 12/01/2013   Procedure: COLONOSCOPY  WITH PROPOFOL ;  Surgeon: [facility] MAR[facility] Louder, [facility];  Location: [facility] ENDOSCOPY;  Service: Endoscopy;  Laterality: N/A;   COMPLEX WOUND CLOSURE  07/25/2018   Procedure: Complex Wound Closure;  Surgeon: [facility];  Location: [facility] OR;  Service: General;;   [facility]  07/25/2018   Procedure: Ileocecetomy;  Surgeon: [facility];  Location: [facility];  Service: General;;   INCISIONAL HERNIA REPAIR N/A 03/10/2016   Procedure: INCISIONAL HERNIA REPAIR TIMES TWO;  Surgeon: [facility], [facility];  Location: [facility] OR;  Service: General;  Laterality: N/A;   INGUINAL HERNIA REPAIR Bilateral 03/10/2016   Procedure: BILATERAL INGUINAL HERNIA REPAIRS;  Surgeon: [facility], [facility];  Location: [facility] OR;  Service: General;  Laterality: Bilateral;   INSERTION OF MESH N/A 03/10/2016   Procedure: INSERTION OF MESH TO BILATERAL GROINS AND ABDOMEN;  Surgeon: [facility], [facility];  Location: [facility] OR;  Service: General;  Laterality: N/A;   IR CHOLANGIOGRAM EXISTING TUBE  09/01/2022   IR EXCHANGE BILIARY DRAIN  10/02/2022   IR EXCHANGE BILIARY DRAIN  11/03/2022   IR PERC CHOLECYSTOSTOMY  07/18/2022   IR RADIOLOGIST EVAL & MGMT  10/16/2022   IR RADIOLOGIST EVAL & MGMT  12/06/2022   IR REMOVAL OF CALCULI/DEBRIS BILIARY DUCT/GB  11/03/2022   laparotomy  06/2015   LAPAROTOMY N/A 07/23/2018   Procedure: EXPLORATORY LAPAROTOMY;  Surgeon: [facility];  Location: [facility] OR;  Service: General;  Laterality: N/A;   LEFT HEART CATH AND CORONARY ANGIOGRAPHY  N/A 09/27/2016   Procedure: Left Heart Cath and Coronary Angiography;  Surgeon: [facility]per, [facility];  Location: [facility] INVASIVE CV LAB;  Service: Cardiovascular;  Laterality: N/A;   TONSILLECTOMY         Initial Focused Assessment (If applicable, or please see trauma documentation):   CT's Completed:   CT Head, CT Maxillofacial, and CT C-Spine   Interventions:   Plan for disposition:  Discharge home   Consults completed:  none .  Event Summary:  MTP Summary (If applicable):  N/A  Bedside handoff with ED RN [facility].    [facility]  Trauma Response RN  Please call TRN at [phone] for further assistance.
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
## 2023-10-24 NOTE — ED Notes (Signed)
 All trauma labs in Lab if needed

## 2023-10-24 NOTE — ED Notes (Signed)
 Wound care performed by tori RN at bedside.

## 2023-10-24 NOTE — Discharge Instructions (Addendum)
 You were seen in the Emergency Department after a fall There was a minimally displaced nasal bone fracture along with scalp contusions and lacerations but no evidence of bleeding within your brain There are no other broken bones or major injuries from what we can see The lacerations were repaired with suturing and some with Steri-Strips Please keep these wounds clean and dry and be on the look out for signs of infection The sutures will need to come out in 8 to 10 days Follow-up with your primary care doctor within 1 week for reevaluation Return to the emergency department for severe pain or any other concern

## 2023-10-29 DIAGNOSIS — Z111 Encounter for screening for respiratory tuberculosis: Secondary | ICD-10-CM | POA: Diagnosis not present

## 2023-10-29 DIAGNOSIS — E538 Deficiency of other specified B group vitamins: Secondary | ICD-10-CM | POA: Diagnosis not present

## 2023-10-29 DIAGNOSIS — E785 Hyperlipidemia, unspecified: Secondary | ICD-10-CM | POA: Diagnosis not present

## 2023-10-29 DIAGNOSIS — E039 Hypothyroidism, unspecified: Secondary | ICD-10-CM | POA: Diagnosis not present

## 2023-10-29 DIAGNOSIS — I10 Essential (primary) hypertension: Secondary | ICD-10-CM | POA: Diagnosis not present

## 2023-12-25 DIAGNOSIS — W1830XA Fall on same level, unspecified, initial encounter: Secondary | ICD-10-CM | POA: Diagnosis not present

## 2023-12-25 DIAGNOSIS — Z8673 Personal history of transient ischemic attack (TIA), and cerebral infarction without residual deficits: Secondary | ICD-10-CM | POA: Diagnosis not present

## 2023-12-25 DIAGNOSIS — S41112A Laceration without foreign body of left upper arm, initial encounter: Secondary | ICD-10-CM | POA: Insufficient documentation

## 2023-12-25 DIAGNOSIS — Z7901 Long term (current) use of anticoagulants: Secondary | ICD-10-CM | POA: Insufficient documentation

## 2023-12-25 DIAGNOSIS — I2510 Atherosclerotic heart disease of native coronary artery without angina pectoris: Secondary | ICD-10-CM | POA: Insufficient documentation

## 2023-12-25 DIAGNOSIS — I10 Essential (primary) hypertension: Secondary | ICD-10-CM | POA: Diagnosis not present

## 2023-12-25 DIAGNOSIS — S0083XA Contusion of other part of head, initial encounter: Secondary | ICD-10-CM | POA: Diagnosis not present

## 2023-12-25 DIAGNOSIS — J449 Chronic obstructive pulmonary disease, unspecified: Secondary | ICD-10-CM | POA: Diagnosis not present

## 2023-12-25 DIAGNOSIS — I4891 Unspecified atrial fibrillation: Secondary | ICD-10-CM | POA: Diagnosis not present

## 2023-12-25 DIAGNOSIS — R519 Headache, unspecified: Secondary | ICD-10-CM | POA: Diagnosis not present

## 2023-12-25 DIAGNOSIS — W19XXXA Unspecified fall, initial encounter: Secondary | ICD-10-CM | POA: Diagnosis not present

## 2023-12-25 DIAGNOSIS — M25512 Pain in left shoulder: Secondary | ICD-10-CM | POA: Diagnosis not present

## 2023-12-25 DIAGNOSIS — I509 Heart failure, unspecified: Secondary | ICD-10-CM | POA: Insufficient documentation

## 2023-12-25 DIAGNOSIS — M79622 Pain in left upper arm: Secondary | ICD-10-CM | POA: Diagnosis not present

## 2023-12-26 ENCOUNTER — Emergency Department (HOSPITAL_COMMUNITY)

## 2023-12-26 ENCOUNTER — Encounter (HOSPITAL_COMMUNITY): Payer: Self-pay

## 2023-12-26 ENCOUNTER — Emergency Department (HOSPITAL_COMMUNITY)
Admission: EM | Admit: 2023-12-26 | Discharge: 2023-12-26 | Attending: Emergency Medicine | Admitting: Emergency Medicine

## 2023-12-26 ENCOUNTER — Other Ambulatory Visit: Payer: Self-pay

## 2023-12-26 DIAGNOSIS — S4992XA Unspecified injury of left shoulder and upper arm, initial encounter: Secondary | ICD-10-CM | POA: Diagnosis not present

## 2023-12-26 DIAGNOSIS — E039 Hypothyroidism, unspecified: Secondary | ICD-10-CM | POA: Diagnosis not present

## 2023-12-26 DIAGNOSIS — I517 Cardiomegaly: Secondary | ICD-10-CM | POA: Diagnosis not present

## 2023-12-26 DIAGNOSIS — S0181XD Laceration without foreign body of other part of head, subsequent encounter: Secondary | ICD-10-CM | POA: Diagnosis not present

## 2023-12-26 DIAGNOSIS — W19XXXA Unspecified fall, initial encounter: Secondary | ICD-10-CM

## 2023-12-26 DIAGNOSIS — Z7401 Bed confinement status: Secondary | ICD-10-CM | POA: Diagnosis not present

## 2023-12-26 DIAGNOSIS — S00432A Contusion of left ear, initial encounter: Secondary | ICD-10-CM | POA: Diagnosis not present

## 2023-12-26 DIAGNOSIS — I771 Stricture of artery: Secondary | ICD-10-CM | POA: Diagnosis not present

## 2023-12-26 DIAGNOSIS — E785 Hyperlipidemia, unspecified: Secondary | ICD-10-CM | POA: Diagnosis not present

## 2023-12-26 DIAGNOSIS — I110 Hypertensive heart disease with heart failure: Secondary | ICD-10-CM | POA: Diagnosis not present

## 2023-12-26 DIAGNOSIS — I272 Pulmonary hypertension, unspecified: Secondary | ICD-10-CM | POA: Diagnosis not present

## 2023-12-26 DIAGNOSIS — I4821 Permanent atrial fibrillation: Secondary | ICD-10-CM | POA: Diagnosis not present

## 2023-12-26 DIAGNOSIS — S022XXD Fracture of nasal bones, subsequent encounter for fracture with routine healing: Secondary | ICD-10-CM | POA: Diagnosis not present

## 2023-12-26 DIAGNOSIS — G300 Alzheimer's disease with early onset: Secondary | ICD-10-CM | POA: Diagnosis not present

## 2023-12-26 DIAGNOSIS — S199XXA Unspecified injury of neck, initial encounter: Secondary | ICD-10-CM | POA: Diagnosis not present

## 2023-12-26 DIAGNOSIS — S299XXA Unspecified injury of thorax, initial encounter: Secondary | ICD-10-CM | POA: Diagnosis not present

## 2023-12-26 DIAGNOSIS — I6782 Cerebral ischemia: Secondary | ICD-10-CM | POA: Diagnosis not present

## 2023-12-26 DIAGNOSIS — S3993XA Unspecified injury of pelvis, initial encounter: Secondary | ICD-10-CM | POA: Diagnosis not present

## 2023-12-26 DIAGNOSIS — M47812 Spondylosis without myelopathy or radiculopathy, cervical region: Secondary | ICD-10-CM | POA: Diagnosis not present

## 2023-12-26 DIAGNOSIS — I700 Atherosclerosis of aorta: Secondary | ICD-10-CM | POA: Diagnosis not present

## 2023-12-26 DIAGNOSIS — F02A0 Dementia in other diseases classified elsewhere, mild, without behavioral disturbance, psychotic disturbance, mood disturbance, and anxiety: Secondary | ICD-10-CM | POA: Diagnosis not present

## 2023-12-26 DIAGNOSIS — R918 Other nonspecific abnormal finding of lung field: Secondary | ICD-10-CM | POA: Diagnosis not present

## 2023-12-26 DIAGNOSIS — S0003XA Contusion of scalp, initial encounter: Secondary | ICD-10-CM | POA: Diagnosis not present

## 2023-12-26 DIAGNOSIS — M50321 Other cervical disc degeneration at C4-C5 level: Secondary | ICD-10-CM | POA: Diagnosis not present

## 2023-12-26 DIAGNOSIS — I2510 Atherosclerotic heart disease of native coronary artery without angina pectoris: Secondary | ICD-10-CM | POA: Diagnosis not present

## 2023-12-26 DIAGNOSIS — I5022 Chronic systolic (congestive) heart failure: Secondary | ICD-10-CM | POA: Diagnosis not present

## 2023-12-26 DIAGNOSIS — M51369 Other intervertebral disc degeneration, lumbar region without mention of lumbar back pain or lower extremity pain: Secondary | ICD-10-CM | POA: Diagnosis not present

## 2023-12-26 LAB — URINALYSIS, ROUTINE W REFLEX MICROSCOPIC
Bilirubin Urine: NEGATIVE
Glucose, UA: NEGATIVE mg/dL
Hgb urine dipstick: NEGATIVE
Ketones, ur: NEGATIVE mg/dL
Leukocytes,Ua: NEGATIVE
Nitrite: NEGATIVE
Protein, ur: NEGATIVE mg/dL
Specific Gravity, Urine: 1.009 (ref 1.005–1.030)
pH: 5 (ref 5.0–8.0)

## 2023-12-26 LAB — TROPONIN T, HIGH SENSITIVITY: Troponin T High Sensitivity: <15 ng/L (ref 0–19)

## 2023-12-26 LAB — CBC
HCT: 35.4 % — ABNORMAL LOW (ref 36.0–46.0)
Hemoglobin: 11.6 g/dL — ABNORMAL LOW (ref 12.0–15.0)
MCH: 30.5 pg (ref 26.0–34.0)
MCHC: 32.8 g/dL (ref 30.0–36.0)
MCV: 93.2 fL (ref 80.0–100.0)
Platelets: 265 K/uL (ref 150–400)
RBC: 3.8 MIL/uL — ABNORMAL LOW (ref 3.87–5.11)
RDW: 13.4 % (ref 11.5–15.5)
WBC: 9.7 K/uL (ref 4.0–10.5)
nRBC: 0 % (ref 0.0–0.2)

## 2023-12-26 LAB — COMPREHENSIVE METABOLIC PANEL WITH GFR
ALT: 13 U/L (ref 0–44)
AST: 18 U/L (ref 15–41)
Albumin: 3.9 g/dL (ref 3.5–5.0)
Alkaline Phosphatase: 67 U/L (ref 38–126)
Anion gap: 11 (ref 5–15)
BUN: 19 mg/dL (ref 8–23)
CO2: 22 mmol/L (ref 22–32)
Calcium: 8.7 mg/dL — ABNORMAL LOW (ref 8.9–10.3)
Chloride: 104 mmol/L (ref 98–111)
Creatinine, Ser: 0.66 mg/dL (ref 0.44–1.00)
GFR, Estimated: >60 mL/min (ref >60)
Glucose, Bld: 117 mg/dL — ABNORMAL HIGH (ref 70–99)
Potassium: 4.3 mmol/L (ref 3.5–5.1)
Sodium: 138 mmol/L (ref 135–145)
Total Bilirubin: 0.6 mg/dL (ref 0.0–1.2)
Total Protein: 6.1 g/dL — ABNORMAL LOW (ref 6.5–8.1)

## 2023-12-26 LAB — MAGNESIUM: Magnesium: 1.9 mg/dL (ref 1.7–2.4)

## 2023-12-26 MED ORDER — CEFPODOXIME PROXETIL 200 MG PO TABS: 200.0000 mg | ORAL_TABLET | Freq: Two times a day (BID) | ORAL | 0 refills | Status: AC

## 2023-12-26 MED ORDER — AZITHROMYCIN 250 MG PO TABS
250.0000 mg | ORAL_TABLET | Freq: Every day | ORAL | 0 refills | Status: AC
Start: 2023-12-26 — End: ?

## 2023-12-26 MED ORDER — SODIUM CHLORIDE 0.9 % IV SOLN
1.0000 g | Freq: Once | INTRAVENOUS | Status: AC
  Administered 2023-12-26: 1 g via INTRAVENOUS
  Filled 2023-12-26: qty 10

## 2023-12-26 MED ORDER — SODIUM CHLORIDE 0.9 % IV SOLN
500.0000 mg | Freq: Once | INTRAVENOUS | Status: AC
  Administered 2023-12-26: 500 mg via INTRAVENOUS
  Filled 2023-12-26: qty 5

## 2023-12-26 NOTE — ED Triage Notes (Signed)
 [place] [facility] c/o fall tonight. Skin tear to left arm that is bandaged. Hematoma behind left ear. No blood thinners. Pt has had more falls recently.

## 2023-12-26 NOTE — Discharge Instructions (Signed)
 Your imaging studies tonight did not show any major injuries.  Keep area of wound on left arm clean and dry.  Your chest x-ray does show concern of possible pneumonia or aspiration.  You were given initial doses of antibiotics in the emergency department.  This will cover you for 24 hours.  Prescriptions for ongoing antibiotics are attached.  Take as prescribed.  Return to the emergency department for any new or worsening symptoms of concern.

## 2023-12-26 NOTE — ED Provider Notes (Signed)
 [place] EMERGENCY DEPARTMENT AT [facility] Provider Note   CSN: [id number] Arrival date & time: 12/25/23  2358     Patient presents with: No chief complaint on file.   [facility] is a 89 y.o. female.  {Add pertinent medical, surgical, social history, OB history to YEP:67052} HPI Patient presents after fall.  Medical history includes CAD, COPD, atrial fibrillation, HLD, CHF, CVA, arthritis.  She is prescribed Eliquis .***    Prior to Admission medications   Medication Sig Start Date End Date Taking? Authorizing Provider  acetaminophen  (TYLENOL ) 500 MG tablet Take 1,000 mg by mouth every 6 (six) hours as needed for mild pain.     [provider]  alendronate (FOSAMAX) 70 MG tablet Take 70 mg by mouth once a week. Saturday 11/22/20   [provider]  apixaban  (ELIQUIS ) 5 MG TABS tablet Take 1 tablet (5 mg total) by mouth 2 (two) times daily. 01/09/23   Nahser, Aleene PARAS, MD  Cholecalciferol  (VITAMIN D3) 50 MCG (2000 UT) TABS Take 2,000 Units by mouth daily with lunch.    [provider]  diltiazem  (TIAZAC ) 300 MG 24 hr capsule Take 300 mg by mouth daily. 05/08/22   [provider]  famotidine (PEPCID) 10 MG tablet Take 10 mg by mouth daily as needed for heartburn.    [provider]  levothyroxine  (SYNTHROID ) 88 MCG tablet Take 88 mcg by mouth every morning. 10/30/19   [provider]  metoprolol  succinate (TOPROL -XL) 100 MG 24 hr tablet Take 1 tablet (100 mg total) by mouth 2 (two) times daily. Take with or immediately following a meal. 12/27/20   Nahser, Aleene PARAS, MD  NITROSTAT  0.4 MG SL tablet Place 1 tablet (0.4 mg total) under the tongue every 5 (five) minutes as needed. Chest pain 06/24/18   Nahser, Aleene PARAS, MD  ondansetron  (ZOFRAN ) 4 MG tablet Take 1 tablet (4 mg total) by mouth every 6 (six) hours as needed for nausea. 07/20/22   Sherrill Cable Latif, DO  oxyCODONE  (OXY IR/ROXICODONE ) 5 MG immediate release tablet Take  0.5-1 tablets (2.5-5 mg total) by mouth every 6 (six) hours as needed for severe pain or moderate pain. 07/20/22   Sheikh, Omair Latif, DO  Polyethyl Glycol-Propyl Glycol 0.4-0.3 % SOLN Place 1-2 drops into both eyes daily as needed (for dry eye).    [provider]  predniSONE  (DELTASONE ) 10 MG tablet Take 1 tablet (10 mg total) by mouth daily with breakfast. 01/01/23   Harden Jerona GAILS, MD  rosuvastatin  (CRESTOR ) 10 MG tablet Take 1 tablet by mouth once daily Patient taking differently: Take 10 mg by mouth daily. 12/26/21   Nahser, Aleene PARAS, MD  sodium chloride  flush 0.9 % SOLN injection Instill 5 mL into the drain as needed. Discard extra 5ml from syringe once used 07/20/22   Sheikh, Omair Latif, DO  spironolactone  (ALDACTONE ) 25 MG tablet Take 1 tablet by mouth once daily Patient taking differently: Take 25 mg by mouth daily. 09/13/21   Jerrie Anger, PA    Allergies: Propofol  and Simvastatin    Review of Systems  Updated Vital Signs There were no vitals taken for this visit.  Physical Exam  (all labs ordered are listed, but only abnormal results are displayed) Labs Reviewed - No data to display  EKG: None  Radiology: No results found.  {Document cardiac monitor, telemetry assessment procedure when appropriate:32947} Procedures   Medications Ordered in the ED - No data to display    {Click  here for ABCD2, HEART and other calculators REFRESH Note before signing:1}                              Medical Decision Making  ***  {Document critical care time when appropriate  Document review of labs and clinical decision tools ie [id number], etc  Document your independent review of radiology images and any outside records  Document your discussion with family members, caretakers and with consultants  Document social determinants of health affecting pt's care  Document your decision making why or why not admission, treatments were needed:32947:::1}   Final  diagnoses:  None    ED Discharge Orders     None

## 2024-01-07 DIAGNOSIS — Z8481 Family history of carrier of genetic disease: Secondary | ICD-10-CM | POA: Diagnosis not present

## 2024-01-07 DIAGNOSIS — Z822 Family history of deafness and hearing loss: Secondary | ICD-10-CM | POA: Diagnosis not present

## 2024-01-07 DIAGNOSIS — Z1371 Encounter for nonprocreative screening for genetic disease carrier status: Secondary | ICD-10-CM | POA: Diagnosis not present

## 2024-01-07 DIAGNOSIS — Z1589 Genetic susceptibility to other disease: Secondary | ICD-10-CM | POA: Diagnosis not present

## 2024-01-07 DIAGNOSIS — H93012 Transient ischemic deafness, left ear: Secondary | ICD-10-CM | POA: Diagnosis not present
